# Patient Record
Sex: Male | Born: 1955
Health system: Southern US, Community
[De-identification: ages and names within clinical notes are randomized; demographics above are authoritative.]

## PROBLEM LIST (undated history)

## (undated) DIAGNOSIS — M109 Gout, unspecified: Secondary | ICD-10-CM

## (undated) DIAGNOSIS — M4727 Other spondylosis with radiculopathy, lumbosacral region: Secondary | ICD-10-CM

## (undated) DIAGNOSIS — R0989 Other specified symptoms and signs involving the circulatory and respiratory systems: Secondary | ICD-10-CM

## (undated) DIAGNOSIS — M549 Dorsalgia, unspecified: Secondary | ICD-10-CM

## (undated) DIAGNOSIS — F32A Depression, unspecified: Secondary | ICD-10-CM

## (undated) DIAGNOSIS — K860 Alcohol-induced chronic pancreatitis: Secondary | ICD-10-CM

## (undated) DIAGNOSIS — G621 Alcoholic polyneuropathy: Secondary | ICD-10-CM

## (undated) DIAGNOSIS — Z8719 Personal history of other diseases of the digestive system: Secondary | ICD-10-CM

## (undated) DIAGNOSIS — B1910 Unspecified viral hepatitis B without hepatic coma: Secondary | ICD-10-CM

## (undated) DIAGNOSIS — M79609 Pain in unspecified limb: Secondary | ICD-10-CM

## (undated) DIAGNOSIS — R569 Unspecified convulsions: Secondary | ICD-10-CM

## (undated) DIAGNOSIS — K259 Gastric ulcer, unspecified as acute or chronic, without hemorrhage or perforation: Secondary | ICD-10-CM

## (undated) DIAGNOSIS — Z87891 Personal history of nicotine dependence: Secondary | ICD-10-CM

## (undated) DIAGNOSIS — K7460 Unspecified cirrhosis of liver: Secondary | ICD-10-CM

## (undated) DIAGNOSIS — F329 Major depressive disorder, single episode, unspecified: Secondary | ICD-10-CM

## (undated) DIAGNOSIS — M654 Radial styloid tenosynovitis [de Quervain]: Secondary | ICD-10-CM

## (undated) DIAGNOSIS — C3492 Malignant neoplasm of unspecified part of left bronchus or lung: Secondary | ICD-10-CM

## (undated) DIAGNOSIS — D696 Thrombocytopenia, unspecified: Secondary | ICD-10-CM

## (undated) DIAGNOSIS — R911 Solitary pulmonary nodule: Secondary | ICD-10-CM

## (undated) DIAGNOSIS — M79673 Pain in unspecified foot: Secondary | ICD-10-CM

## (undated) DIAGNOSIS — C229 Malignant neoplasm of liver, not specified as primary or secondary: Secondary | ICD-10-CM

## (undated) DIAGNOSIS — Z87898 Personal history of other specified conditions: Secondary | ICD-10-CM

## (undated) DIAGNOSIS — F1120 Opioid dependence, uncomplicated: Secondary | ICD-10-CM

## (undated) DIAGNOSIS — J449 Chronic obstructive pulmonary disease, unspecified: Secondary | ICD-10-CM

## (undated) DIAGNOSIS — M542 Cervicalgia: Secondary | ICD-10-CM

## (undated) DIAGNOSIS — G40909 Epilepsy, unspecified, not intractable, without status epilepticus: Secondary | ICD-10-CM

## (undated) DIAGNOSIS — M4726 Other spondylosis with radiculopathy, lumbar region: Secondary | ICD-10-CM

## (undated) DIAGNOSIS — D72819 Decreased white blood cell count, unspecified: Secondary | ICD-10-CM

## (undated) DIAGNOSIS — R161 Splenomegaly, not elsewhere classified: Secondary | ICD-10-CM

## (undated) DIAGNOSIS — F419 Anxiety disorder, unspecified: Secondary | ICD-10-CM

## (undated) DIAGNOSIS — R5600 Simple febrile convulsions: Secondary | ICD-10-CM

## (undated) DIAGNOSIS — G249 Dystonia, unspecified: Secondary | ICD-10-CM

## (undated) DIAGNOSIS — K649 Unspecified hemorrhoids: Secondary | ICD-10-CM

## (undated) DIAGNOSIS — R937 Abnormal findings on diagnostic imaging of other parts of musculoskeletal system: Secondary | ICD-10-CM

## (undated) DIAGNOSIS — G4733 Obstructive sleep apnea (adult) (pediatric): Secondary | ICD-10-CM

## (undated) DIAGNOSIS — S0990XA Unspecified injury of head, initial encounter: Secondary | ICD-10-CM

## (undated) DIAGNOSIS — M4800 Spinal stenosis, site unspecified: Secondary | ICD-10-CM

## (undated) DIAGNOSIS — G629 Polyneuropathy, unspecified: Secondary | ICD-10-CM

## (undated) DIAGNOSIS — M792 Neuralgia and neuritis, unspecified: Secondary | ICD-10-CM

## (undated) HISTORY — DX: Cervicalgia: M54.2

## (undated) HISTORY — PX: OTHER SURGICAL HISTORY: SHX169

## (undated) HISTORY — DX: Unspecified hemorrhoids: K64.9

## (undated) HISTORY — DX: Unspecified convulsions: R56.9

## (undated) HISTORY — DX: Dystonia, unspecified: G24.9

## (undated) HISTORY — DX: Pain in unspecified foot: M79.673

## (undated) HISTORY — DX: Personal history of nicotine dependence: Z87.891

## (undated) HISTORY — DX: Spinal stenosis, site unspecified: M48.00

## (undated) HISTORY — DX: Alcoholic polyneuropathy: G62.1

## (undated) HISTORY — DX: Decreased white blood cell count, unspecified: D72.819

## (undated) HISTORY — DX: Neuralgia and neuritis, unspecified: M79.2

## (undated) HISTORY — DX: Alcohol-induced chronic pancreatitis: K86.0

## (undated) HISTORY — DX: Personal history of other diseases of the digestive system: Z87.19

## (undated) HISTORY — DX: Radial styloid tenosynovitis (de quervain): M65.4

## (undated) HISTORY — DX: Epilepsy, unspecified, not intractable, without status epilepticus: G40.909

## (undated) HISTORY — DX: Gout, unspecified: M10.9

## (undated) HISTORY — DX: Major depressive disorder, single episode, unspecified: F32.9

## (undated) HISTORY — DX: Other spondylosis with radiculopathy, lumbar region: M47.26

## (undated) HISTORY — DX: Polyneuropathy, unspecified: G62.9

## (undated) HISTORY — DX: Obstructive sleep apnea (adult) (pediatric): G47.33

## (undated) HISTORY — DX: Abnormal findings on diagnostic imaging of other parts of musculoskeletal system: R93.7

## (undated) HISTORY — DX: Unspecified viral hepatitis B without hepatic coma: B19.10

## (undated) HISTORY — DX: Gastric ulcer, unspecified as acute or chronic, without hemorrhage or perforation: K25.9

## (undated) HISTORY — DX: Unspecified injury of head, initial encounter: S09.90XA

## (undated) HISTORY — DX: Simple febrile convulsions: R56.00

## (undated) HISTORY — DX: Anxiety disorder, unspecified: F41.9

## (undated) HISTORY — DX: Splenomegaly, not elsewhere classified: R16.1

## (undated) HISTORY — DX: Unspecified cirrhosis of liver: K74.60

## (undated) HISTORY — DX: Other spondylosis with radiculopathy, lumbosacral region: M47.27

## (undated) HISTORY — DX: Personal history of other specified conditions: Z87.898

## (undated) HISTORY — DX: Opioid dependence, uncomplicated: F11.20

## (undated) HISTORY — DX: Thrombocytopenia, unspecified: D69.6

## (undated) HISTORY — DX: Depression, unspecified: F32.A

## (undated) HISTORY — DX: Chronic obstructive pulmonary disease, unspecified: J44.9

## (undated) HISTORY — DX: Other specified symptoms and signs involving the circulatory and respiratory systems: R09.89

## (undated) HISTORY — DX: Solitary pulmonary nodule: R91.1

## (undated) HISTORY — DX: Dorsalgia, unspecified: M54.9

## (undated) HISTORY — DX: Pain in unspecified limb: M79.609

---

## 2005-11-20 ENCOUNTER — Other Ambulatory Visit: Payer: Self-pay

## 2005-11-20 ENCOUNTER — Emergency Department: Payer: Self-pay | Admitting: Emergency Medicine

## 2006-03-05 ENCOUNTER — Inpatient Hospital Stay: Payer: Self-pay | Admitting: Internal Medicine

## 2006-03-05 ENCOUNTER — Other Ambulatory Visit: Payer: Self-pay

## 2009-06-14 ENCOUNTER — Emergency Department: Payer: Self-pay | Admitting: Unknown Physician Specialty

## 2009-06-16 ENCOUNTER — Emergency Department: Payer: Self-pay | Admitting: Emergency Medicine

## 2009-12-28 ENCOUNTER — Emergency Department: Payer: Self-pay | Admitting: Emergency Medicine

## 2012-03-06 ENCOUNTER — Ambulatory Visit: Payer: Self-pay | Admitting: Internal Medicine

## 2012-03-18 ENCOUNTER — Inpatient Hospital Stay: Payer: Self-pay | Admitting: Internal Medicine

## 2012-03-18 LAB — CBC WITH DIFFERENTIAL/PLATELET
Basophil #: 0 10*3/uL (ref 0.0–0.1)
Eosinophil #: 0 10*3/uL (ref 0.0–0.7)
Lymphocyte %: 5.2 %
Lymphocytes: 3 %
MCHC: 34.2 g/dL (ref 32.0–36.0)
Monocyte %: 7.2 %
Neutrophil #: 2.6 10*3/uL (ref 1.4–6.5)
Neutrophil %: 86.8 %
Platelet: 19 10*3/uL — CL (ref 150–440)
RDW: 14.8 % — ABNORMAL HIGH (ref 11.5–14.5)
Segmented Neutrophils: 83 %
Variant Lymphocyte - H1-Rlymph: 1 %

## 2012-03-18 LAB — COMPREHENSIVE METABOLIC PANEL
Albumin: 3.3 g/dL — ABNORMAL LOW (ref 3.4–5.0)
Anion Gap: 12 (ref 7–16)
BUN: 4 mg/dL — ABNORMAL LOW (ref 7–18)
Calcium, Total: 8.2 mg/dL — ABNORMAL LOW (ref 8.5–10.1)
Chloride: 103 mmol/L (ref 98–107)
Co2: 26 mmol/L (ref 21–32)
EGFR (Non-African Amer.): >60
Potassium: 3.7 mmol/L (ref 3.5–5.1)
SGOT(AST): 343 U/L — ABNORMAL HIGH (ref 15–37)
SGPT (ALT): 119 U/L — ABNORMAL HIGH
Total Protein: 7.5 g/dL (ref 6.4–8.2)

## 2012-03-18 LAB — APTT: Activated PTT: 33.9 secs (ref 23.6–35.9)

## 2012-03-18 LAB — HEMOGLOBIN: HGB: 13.6 g/dL (ref 13.0–18.0)

## 2012-03-18 LAB — ETHANOL
Ethanol %: 0.049 % (ref 0.000–0.080)
Ethanol: 49 mg/dL

## 2012-03-18 LAB — PROTIME-INR
INR: 1.2
Prothrombin Time: 15.2 secs — ABNORMAL HIGH (ref 11.5–14.7)

## 2012-03-19 LAB — BASIC METABOLIC PANEL
Anion Gap: 9 (ref 7–16)
BUN: 6 mg/dL — ABNORMAL LOW (ref 7–18)
Co2: 28 mmol/L (ref 21–32)
Creatinine: 0.54 mg/dL — ABNORMAL LOW (ref 0.60–1.30)
EGFR (African American): >60
EGFR (Non-African Amer.): >60
Glucose: 87 mg/dL (ref 65–99)
Sodium: 142 mmol/L (ref 136–145)

## 2012-03-19 LAB — CBC WITH DIFFERENTIAL/PLATELET
Basophil #: 0 10*3/uL (ref 0.0–0.1)
Basophil %: 1 %
Eosinophil %: 4.5 %
HCT: 39.2 % — ABNORMAL LOW (ref 40.0–52.0)
Lymphocyte #: 0.4 10*3/uL — ABNORMAL LOW (ref 1.0–3.6)
Lymphocyte %: 22.3 %
MCH: 37.6 pg — ABNORMAL HIGH (ref 26.0–34.0)
MCHC: 33.5 g/dL (ref 32.0–36.0)
MCV: 112 fL — ABNORMAL HIGH (ref 80–100)
Monocyte #: 0.1 x10 3/mm — ABNORMAL LOW (ref 0.2–1.0)
Neutrophil #: 1.3 10*3/uL — ABNORMAL LOW (ref 1.4–6.5)
Platelet: 32 10*3/uL — ABNORMAL LOW (ref 150–440)
RBC: 3.49 10*6/uL — ABNORMAL LOW (ref 4.40–5.90)
RDW: 14.9 % — ABNORMAL HIGH (ref 11.5–14.5)

## 2012-03-19 LAB — PROTIME-INR
INR: 1.2
Prothrombin Time: 15.4 secs — ABNORMAL HIGH (ref 11.5–14.7)

## 2012-03-19 LAB — POTASSIUM: Potassium: 4.1 mmol/L (ref 3.5–5.1)

## 2012-03-20 LAB — CBC WITH DIFFERENTIAL/PLATELET
Basophil %: 1.2 %
Eosinophil %: 6.4 %
HGB: 14.4 g/dL (ref 13.0–18.0)
Lymphocyte %: 31.7 %
MCH: 38.3 pg — ABNORMAL HIGH (ref 26.0–34.0)
Monocyte %: 11.2 %
Neutrophil %: 49.5 %
Platelet: 31 10*3/uL — ABNORMAL LOW (ref 150–440)
RBC: 3.76 10*6/uL — ABNORMAL LOW (ref 4.40–5.90)
WBC: 1.7 10*3/uL — CL (ref 3.8–10.6)

## 2012-03-20 LAB — BASIC METABOLIC PANEL
Anion Gap: 9 (ref 7–16)
BUN: 5 mg/dL — ABNORMAL LOW (ref 7–18)
Co2: 29 mmol/L (ref 21–32)
Creatinine: 0.44 mg/dL — ABNORMAL LOW (ref 0.60–1.30)
EGFR (African American): >60
Sodium: 139 mmol/L (ref 136–145)

## 2012-03-21 LAB — CBC WITH DIFFERENTIAL/PLATELET
Basophil #: 0 10*3/uL (ref 0.0–0.1)
Eosinophil #: 0 10*3/uL (ref 0.0–0.7)
HGB: 15.4 g/dL (ref 13.0–18.0)
Lymphocyte %: 20.2 %
MCHC: 33 g/dL (ref 32.0–36.0)
Neutrophil %: 59.3 %
RDW: 14.3 % (ref 11.5–14.5)
WBC: 2.4 10*3/uL — ABNORMAL LOW (ref 3.8–10.6)

## 2012-03-21 LAB — COMPREHENSIVE METABOLIC PANEL
Albumin: 3.4 g/dL (ref 3.4–5.0)
Anion Gap: 8 (ref 7–16)
BUN: 5 mg/dL — ABNORMAL LOW (ref 7–18)
Bilirubin,Total: 3.3 mg/dL — ABNORMAL HIGH (ref 0.2–1.0)
Calcium, Total: 8.6 mg/dL (ref 8.5–10.1)
Chloride: 102 mmol/L (ref 98–107)
Creatinine: 0.6 mg/dL (ref 0.60–1.30)
EGFR (Non-African Amer.): >60
Osmolality: 277 (ref 275–301)
Potassium: 3.3 mmol/L — ABNORMAL LOW (ref 3.5–5.1)
SGOT(AST): 114 U/L — ABNORMAL HIGH (ref 15–37)
Sodium: 140 mmol/L (ref 136–145)

## 2012-03-21 LAB — MAGNESIUM: Magnesium: 1.9 mg/dL

## 2012-03-22 LAB — COMPREHENSIVE METABOLIC PANEL
Albumin: 3 g/dL — ABNORMAL LOW (ref 3.4–5.0)
Anion Gap: 9 (ref 7–16)
Calcium, Total: 8.4 mg/dL — ABNORMAL LOW (ref 8.5–10.1)
Chloride: 102 mmol/L (ref 98–107)
Co2: 28 mmol/L (ref 21–32)
Creatinine: 0.55 mg/dL — ABNORMAL LOW (ref 0.60–1.30)
EGFR (African American): >60
Glucose: 111 mg/dL — ABNORMAL HIGH (ref 65–99)
Potassium: 3.5 mmol/L (ref 3.5–5.1)
Sodium: 139 mmol/L (ref 136–145)

## 2012-03-22 LAB — CBC WITH DIFFERENTIAL/PLATELET
HCT: 42.8 % (ref 40.0–52.0)
MCH: 37.9 pg — ABNORMAL HIGH (ref 26.0–34.0)
MCV: 112 fL — ABNORMAL HIGH (ref 80–100)
Monocyte #: 0.7 x10 3/mm (ref 0.2–1.0)
Monocyte %: 22.1 %
Neutrophil %: 50.6 %
Platelet: 27 10*3/uL — CL (ref 150–440)
RDW: 14.9 % — ABNORMAL HIGH (ref 11.5–14.5)
WBC: 3 10*3/uL — ABNORMAL LOW (ref 3.8–10.6)

## 2012-03-22 LAB — IRON AND TIBC
Iron Bind.Cap.(Total): 251 ug/dL (ref 250–450)
Iron Saturation: 41 %
Unbound Iron-Bind.Cap.: 147 ug/dL

## 2012-03-22 LAB — FERRITIN: Ferritin (ARMC): 1369 ng/mL — ABNORMAL HIGH (ref 8–388)

## 2012-03-22 LAB — AFP TUMOR MARKER: AFP-Tumor Marker: 6.4 ng/mL (ref 0.0–8.3)

## 2012-03-22 LAB — FIBRINOGEN: Fibrinogen: 321 mg/dL (ref 210–470)

## 2012-03-23 LAB — CBC WITH DIFFERENTIAL/PLATELET
Basophil #: 0.1 10*3/uL (ref 0.0–0.1)
Eosinophil #: 0.1 10*3/uL (ref 0.0–0.7)
Eosinophil %: 4.2 %
HCT: 42.1 % (ref 40.0–52.0)
HGB: 14.2 g/dL (ref 13.0–18.0)
Lymphocyte #: 0.6 10*3/uL — ABNORMAL LOW (ref 1.0–3.6)
MCHC: 33.7 g/dL (ref 32.0–36.0)
Monocyte #: 0.5 x10 3/mm (ref 0.2–1.0)
Monocyte %: 20.2 %
Neutrophil #: 1.4 10*3/uL (ref 1.4–6.5)
Neutrophil %: 50.5 %
Platelet: 31 10*3/uL — ABNORMAL LOW (ref 150–440)
RDW: 14.9 % — ABNORMAL HIGH (ref 11.5–14.5)
WBC: 2.7 10*3/uL — ABNORMAL LOW (ref 3.8–10.6)

## 2012-03-25 LAB — CBC WITH DIFFERENTIAL/PLATELET
Basophil #: 0 10*3/uL (ref 0.0–0.1)
Basophil %: 1.3 %
Eosinophil #: 0.1 10*3/uL (ref 0.0–0.7)
Eosinophil %: 4.2 %
HGB: 13.6 g/dL (ref 13.0–18.0)
Lymphocyte #: 0.7 10*3/uL — ABNORMAL LOW (ref 1.0–3.6)
Lymphocyte %: 25.4 %
MCHC: 34.2 g/dL (ref 32.0–36.0)
Neutrophil %: 48.2 %
Platelet: 30 10*3/uL — CL (ref 150–440)
RBC: 3.57 10*6/uL — ABNORMAL LOW (ref 4.40–5.90)

## 2012-03-26 LAB — COMPREHENSIVE METABOLIC PANEL
Albumin: 2.9 g/dL — ABNORMAL LOW (ref 3.4–5.0)
Alkaline Phosphatase: 90 U/L (ref 50–136)
Anion Gap: 11 (ref 7–16)
Calcium, Total: 8.9 mg/dL (ref 8.5–10.1)
Co2: 25 mmol/L (ref 21–32)
EGFR (Non-African Amer.): >60
Glucose: 83 mg/dL (ref 65–99)
Osmolality: 270 (ref 275–301)
Potassium: 4 mmol/L (ref 3.5–5.1)
SGOT(AST): 71 U/L — ABNORMAL HIGH (ref 15–37)
Sodium: 137 mmol/L (ref 136–145)

## 2012-03-26 LAB — CBC WITH DIFFERENTIAL/PLATELET
Basophil #: 0 10*3/uL (ref 0.0–0.1)
Eosinophil %: 3.9 %
Lymphocyte #: 0.7 10*3/uL — ABNORMAL LOW (ref 1.0–3.6)
Lymphocyte %: 22.5 %
MCH: 37.6 pg — ABNORMAL HIGH (ref 26.0–34.0)
MCV: 112 fL — ABNORMAL HIGH (ref 80–100)
Monocyte #: 0.6 x10 3/mm (ref 0.2–1.0)
Monocyte %: 20.1 %
Neutrophil %: 52.1 %
Platelet: 34 10*3/uL — ABNORMAL LOW (ref 150–440)
RDW: 14.3 % (ref 11.5–14.5)
WBC: 3.2 10*3/uL — ABNORMAL LOW (ref 3.8–10.6)

## 2012-03-27 LAB — CBC WITH DIFFERENTIAL/PLATELET
Basophil %: 1.3 %
Eosinophil #: 0.1 10*3/uL (ref 0.0–0.7)
HCT: 40.7 % (ref 40.0–52.0)
HGB: 13.4 g/dL (ref 13.0–18.0)
Lymphocyte #: 0.8 10*3/uL — ABNORMAL LOW (ref 1.0–3.6)
Lymphocyte %: 16.1 %
MCH: 36.8 pg — ABNORMAL HIGH (ref 26.0–34.0)
MCHC: 32.8 g/dL (ref 32.0–36.0)
Monocyte #: 0.8 x10 3/mm (ref 0.2–1.0)
Neutrophil #: 3.3 10*3/uL (ref 1.4–6.5)

## 2012-03-28 LAB — CBC WITH DIFFERENTIAL/PLATELET
Basophil %: 1.3 %
Eosinophil %: 3.2 %
HGB: 12.4 g/dL — ABNORMAL LOW (ref 13.0–18.0)
Lymphocyte #: 1 10*3/uL (ref 1.0–3.6)
MCH: 37.8 pg — ABNORMAL HIGH (ref 26.0–34.0)
MCV: 112 fL — ABNORMAL HIGH (ref 80–100)
Monocyte #: 0.8 x10 3/mm (ref 0.2–1.0)
Monocyte %: 17.6 %
Platelet: 38 10*3/uL — ABNORMAL LOW (ref 150–440)
RBC: 3.29 10*6/uL — ABNORMAL LOW (ref 4.40–5.90)
RDW: 14.1 % (ref 11.5–14.5)
WBC: 4.3 10*3/uL (ref 3.8–10.6)

## 2012-04-06 ENCOUNTER — Ambulatory Visit: Payer: Self-pay | Admitting: Internal Medicine

## 2012-04-17 ENCOUNTER — Inpatient Hospital Stay: Payer: Self-pay | Admitting: Internal Medicine

## 2012-04-17 LAB — DRUG SCREEN, URINE
Amphetamines, Ur Screen: NEGATIVE (ref ?–1000)
Benzodiazepine, Ur Scrn: POSITIVE (ref ?–200)
Cannabinoid 50 Ng, Ur ~~LOC~~: NEGATIVE (ref ?–50)
Cocaine Metabolite,Ur ~~LOC~~: NEGATIVE (ref ?–300)
MDMA (Ecstasy)Ur Screen: NEGATIVE (ref ?–500)
Tricyclic, Ur Screen: NEGATIVE (ref ?–1000)

## 2012-04-17 LAB — ETHANOL
Ethanol %: <0.003 % (ref 0.000–0.080)
Ethanol: <3 mg/dL

## 2012-04-17 LAB — COMPREHENSIVE METABOLIC PANEL
Alkaline Phosphatase: 97 U/L (ref 50–136)
Anion Gap: 8 (ref 7–16)
BUN: 4 mg/dL — ABNORMAL LOW (ref 7–18)
Chloride: 99 mmol/L (ref 98–107)
Creatinine: 0.6 mg/dL (ref 0.60–1.30)
EGFR (African American): >60
EGFR (Non-African Amer.): >60
Glucose: 91 mg/dL (ref 65–99)
Osmolality: 270 (ref 275–301)
SGPT (ALT): 35 U/L (ref 12–78)

## 2012-04-17 LAB — URINALYSIS, COMPLETE
Bacteria: NONE SEEN
Bilirubin,UR: NEGATIVE
Leukocyte Esterase: NEGATIVE
Nitrite: NEGATIVE
Ph: 6 (ref 4.5–8.0)
Protein: NEGATIVE
RBC,UR: 1 /HPF (ref 0–5)

## 2012-04-17 LAB — PROTIME-INR
INR: 1.1
Prothrombin Time: 15 secs — ABNORMAL HIGH (ref 11.5–14.7)

## 2012-04-17 LAB — CBC
HCT: 36.1 % — ABNORMAL LOW (ref 40.0–52.0)
MCHC: 35 g/dL (ref 32.0–36.0)
MCV: 111 fL — ABNORMAL HIGH (ref 80–100)
Platelet: 66 10*3/uL — ABNORMAL LOW (ref 150–440)
RDW: 13.9 % (ref 11.5–14.5)
WBC: 5.2 10*3/uL (ref 3.8–10.6)

## 2012-04-17 LAB — CK TOTAL AND CKMB (NOT AT ARMC)
CK, Total: 45 U/L (ref 35–232)
CK-MB: 0.8 ng/mL (ref 0.5–3.6)

## 2012-04-18 LAB — COMPREHENSIVE METABOLIC PANEL
Albumin: 2.3 g/dL — ABNORMAL LOW (ref 3.4–5.0)
Alkaline Phosphatase: 82 U/L (ref 50–136)
Bilirubin,Total: 1.6 mg/dL — ABNORMAL HIGH (ref 0.2–1.0)
Calcium, Total: 8 mg/dL — ABNORMAL LOW (ref 8.5–10.1)
Chloride: 104 mmol/L (ref 98–107)
Co2: 29 mmol/L (ref 21–32)
Creatinine: 0.49 mg/dL — ABNORMAL LOW (ref 0.60–1.30)
EGFR (African American): >60
EGFR (Non-African Amer.): >60
Glucose: 93 mg/dL (ref 65–99)
SGPT (ALT): 30 U/L (ref 12–78)

## 2012-04-18 LAB — CBC WITH DIFFERENTIAL/PLATELET
Eosinophil #: 0.2 10*3/uL (ref 0.0–0.7)
Eosinophil %: 4.1 %
HGB: 12.1 g/dL — ABNORMAL LOW (ref 13.0–18.0)
Lymphocyte %: 18 %
MCH: 37.9 pg — ABNORMAL HIGH (ref 26.0–34.0)
MCHC: 34.7 g/dL (ref 32.0–36.0)
Neutrophil %: 67.3 %
Platelet: 64 10*3/uL — ABNORMAL LOW (ref 150–440)
RBC: 3.18 10*6/uL — ABNORMAL LOW (ref 4.40–5.90)

## 2012-04-18 LAB — WBCS, STOOL

## 2012-04-18 LAB — MAGNESIUM: Magnesium: 1.3 mg/dL — ABNORMAL LOW

## 2012-04-19 LAB — HEMOGLOBIN: HGB: 11.8 g/dL — ABNORMAL LOW (ref 13.0–18.0)

## 2012-04-20 LAB — CBC WITH DIFFERENTIAL/PLATELET
Basophil #: 0.1 10*3/uL (ref 0.0–0.1)
Eosinophil #: 0.3 10*3/uL (ref 0.0–0.7)
Lymphocyte #: 1 10*3/uL (ref 1.0–3.6)
MCH: 38.6 pg — ABNORMAL HIGH (ref 26.0–34.0)
MCHC: 35.1 g/dL (ref 32.0–36.0)
MCV: 110 fL — ABNORMAL HIGH (ref 80–100)
Monocyte #: 0.5 x10 3/mm (ref 0.2–1.0)
Monocyte %: 10.4 %
Neutrophil #: 2.6 10*3/uL (ref 1.4–6.5)
Neutrophil %: 58.2 %
Platelet: 66 10*3/uL — ABNORMAL LOW (ref 150–440)
RDW: 13.9 % (ref 11.5–14.5)
WBC: 4.4 10*3/uL (ref 3.8–10.6)

## 2012-05-25 ENCOUNTER — Emergency Department: Payer: Self-pay | Admitting: Emergency Medicine

## 2012-10-13 ENCOUNTER — Ambulatory Visit: Payer: Self-pay | Admitting: Neurology

## 2012-10-26 ENCOUNTER — Ambulatory Visit: Payer: Self-pay | Admitting: Oncology

## 2012-10-26 ENCOUNTER — Ambulatory Visit: Payer: Self-pay | Admitting: Neurology

## 2012-11-08 ENCOUNTER — Ambulatory Visit: Payer: Self-pay | Admitting: Internal Medicine

## 2012-11-09 LAB — CBC CANCER CENTER
Basophil #: 0.1 x10 3/mm (ref 0.0–0.1)
Basophil %: 2.5 %
Eosinophil #: 0.2 x10 3/mm (ref 0.0–0.7)
Eosinophil %: 4.4 %
HGB: 15 g/dL (ref 13.0–18.0)
Lymphocyte #: 1.1 x10 3/mm (ref 1.0–3.6)
MCH: 35.6 pg — ABNORMAL HIGH (ref 26.0–34.0)
MCHC: 35.4 g/dL (ref 32.0–36.0)
Monocyte #: 0.5 x10 3/mm (ref 0.2–1.0)
Monocyte %: 10.5 %
Neutrophil #: 2.5 x10 3/mm (ref 1.4–6.5)
Neutrophil %: 57.4 %
Platelet: 60 x10 3/mm — ABNORMAL LOW (ref 150–440)
RDW: 13.3 % (ref 11.5–14.5)
WBC: 4.3 x10 3/mm (ref 3.8–10.6)

## 2012-11-09 LAB — HEPATIC FUNCTION PANEL A (ARMC)
Albumin: 3.6 g/dL (ref 3.4–5.0)
Alkaline Phosphatase: 140 U/L — ABNORMAL HIGH (ref 50–136)
Bilirubin, Direct: 0.3 mg/dL — ABNORMAL HIGH (ref 0.00–0.20)
SGOT(AST): 43 U/L — ABNORMAL HIGH (ref 15–37)
SGPT (ALT): 34 U/L (ref 12–78)
Total Protein: 8.9 g/dL — ABNORMAL HIGH (ref 6.4–8.2)

## 2012-11-09 LAB — CREATININE, SERUM: Creatinine: 0.59 mg/dL — ABNORMAL LOW (ref 0.60–1.30)

## 2012-11-09 LAB — PROTIME-INR: INR: 1.1

## 2012-11-13 LAB — PROT IMMUNOELECTROPHORES(ARMC)

## 2012-11-13 LAB — KAPPA/LAMBDA FREE LIGHT CHAINS (ARMC)

## 2012-12-05 ENCOUNTER — Ambulatory Visit: Payer: Self-pay | Admitting: Internal Medicine

## 2012-12-21 LAB — CBC CANCER CENTER
Basophil #: 0 x10 3/mm (ref 0.0–0.1)
Eosinophil #: 0.3 x10 3/mm (ref 0.0–0.7)
Eosinophil %: 5.9 %
HCT: 40.7 % (ref 40.0–52.0)
HGB: 14.2 g/dL (ref 13.0–18.0)
MCH: 34.7 pg — ABNORMAL HIGH (ref 26.0–34.0)
MCHC: 35 g/dL (ref 32.0–36.0)
MCV: 99 fL (ref 80–100)
Monocyte %: 9.7 %
Neutrophil #: 2.5 x10 3/mm (ref 1.4–6.5)
Neutrophil %: 57.6 %
Platelet: 58 x10 3/mm — ABNORMAL LOW (ref 150–440)
RBC: 4.11 10*6/uL — ABNORMAL LOW (ref 4.40–5.90)

## 2012-12-22 LAB — URINE IEP, RANDOM

## 2012-12-25 LAB — KAPPA/LAMBDA FREE LIGHT CHAINS (ARMC)

## 2013-01-04 ENCOUNTER — Ambulatory Visit: Payer: Self-pay | Admitting: Internal Medicine

## 2013-01-11 LAB — CBC CANCER CENTER
Eosinophil #: 0.2 x10 3/mm (ref 0.0–0.7)
Eosinophil %: 4.5 %
HCT: 37.8 % — ABNORMAL LOW (ref 40.0–52.0)
HGB: 13.5 g/dL (ref 13.0–18.0)
Lymphocyte %: 25.2 %
MCH: 35.1 pg — ABNORMAL HIGH (ref 26.0–34.0)
MCHC: 35.7 g/dL (ref 32.0–36.0)
Monocyte #: 0.4 x10 3/mm (ref 0.2–1.0)
Monocyte %: 8.3 %
Neutrophil %: 61.2 %
Platelet: 55 x10 3/mm — ABNORMAL LOW (ref 150–440)
RBC: 3.85 10*6/uL — ABNORMAL LOW (ref 4.40–5.90)
RDW: 13.9 % (ref 11.5–14.5)
WBC: 4.3 x10 3/mm (ref 3.8–10.6)

## 2013-01-25 LAB — CBC CANCER CENTER
Eosinophil #: 0.3 x10 3/mm (ref 0.0–0.7)
HCT: 39.1 % — ABNORMAL LOW (ref 40.0–52.0)
Lymphocyte %: 26.6 %
MCH: 34.6 pg — ABNORMAL HIGH (ref 26.0–34.0)
MCHC: 35.2 g/dL (ref 32.0–36.0)
Monocyte %: 10.7 %
Neutrophil %: 55.8 %
Platelet: 61 x10 3/mm — ABNORMAL LOW (ref 150–440)
RBC: 3.98 10*6/uL — ABNORMAL LOW (ref 4.40–5.90)
WBC: 4.9 x10 3/mm (ref 3.8–10.6)

## 2013-01-25 LAB — RETICULOCYTES: Reticulocyte: 1.78 %

## 2013-01-25 LAB — IRON AND TIBC
Iron Bind.Cap.(Total): 350 ug/dL (ref 250–450)
Iron: 108 ug/dL (ref 65–175)

## 2013-01-25 LAB — LACTATE DEHYDROGENASE: LDH: 203 U/L (ref 85–241)

## 2013-01-25 LAB — FERRITIN: Ferritin (ARMC): 524 ng/mL — ABNORMAL HIGH (ref 8–388)

## 2013-02-04 ENCOUNTER — Ambulatory Visit: Payer: Self-pay | Admitting: Internal Medicine

## 2013-03-06 ENCOUNTER — Ambulatory Visit: Payer: Self-pay | Admitting: Internal Medicine

## 2013-03-13 ENCOUNTER — Encounter: Payer: Self-pay | Admitting: Neurology

## 2013-03-21 LAB — CBC CANCER CENTER
Basophil %: 1.4 %
HGB: 14.7 g/dL (ref 13.0–18.0)
MCH: 36 pg — ABNORMAL HIGH (ref 26.0–34.0)
MCHC: 36.7 g/dL — ABNORMAL HIGH (ref 32.0–36.0)
MCV: 98 fL (ref 80–100)
Monocyte #: 0.6 x10 3/mm (ref 0.2–1.0)
Monocyte %: 11.3 %
Neutrophil #: 2.9 x10 3/mm (ref 1.4–6.5)
RBC: 4.07 10*6/uL — ABNORMAL LOW (ref 4.40–5.90)
RDW: 13.7 % (ref 11.5–14.5)
WBC: 5.2 x10 3/mm (ref 3.8–10.6)

## 2013-04-06 ENCOUNTER — Ambulatory Visit: Payer: Self-pay | Admitting: Internal Medicine

## 2013-04-06 ENCOUNTER — Encounter: Payer: Self-pay | Admitting: Neurology

## 2013-04-16 ENCOUNTER — Ambulatory Visit: Payer: Self-pay | Admitting: Pain Medicine

## 2013-05-16 ENCOUNTER — Ambulatory Visit: Payer: Self-pay | Admitting: Internal Medicine

## 2013-05-16 LAB — CBC CANCER CENTER
Basophil #: 0 x10 3/mm (ref 0.0–0.1)
Basophil %: 0.6 %
Eosinophil #: 0.2 x10 3/mm (ref 0.0–0.7)
Lymphocyte #: 1.2 x10 3/mm (ref 1.0–3.6)
MCH: 35.4 pg — ABNORMAL HIGH (ref 26.0–34.0)
MCHC: 36.1 g/dL — ABNORMAL HIGH (ref 32.0–36.0)
MCV: 98 fL (ref 80–100)
Monocyte #: 0.4 x10 3/mm (ref 0.2–1.0)
Monocyte %: 8.7 %
Neutrophil #: 2.4 x10 3/mm (ref 1.4–6.5)
Platelet: 58 x10 3/mm — ABNORMAL LOW (ref 150–440)
RBC: 3.91 10*6/uL — ABNORMAL LOW (ref 4.40–5.90)
WBC: 4.3 x10 3/mm (ref 3.8–10.6)

## 2013-05-23 LAB — CANCER CTR PLATELET CT: Platelet: 57 x10 3/mm — ABNORMAL LOW (ref 150–440)

## 2013-05-30 DIAGNOSIS — R161 Splenomegaly, not elsewhere classified: Secondary | ICD-10-CM

## 2013-05-30 HISTORY — DX: Splenomegaly, not elsewhere classified: R16.1

## 2013-06-06 ENCOUNTER — Ambulatory Visit: Payer: Self-pay | Admitting: Internal Medicine

## 2013-06-20 LAB — CBC CANCER CENTER
Basophil %: 0.7 %
MCH: 34.9 pg — ABNORMAL HIGH (ref 26.0–34.0)
MCHC: 35.3 g/dL (ref 32.0–36.0)
MCV: 99 fL (ref 80–100)
Monocyte #: 0.5 x10 3/mm (ref 0.2–1.0)
Neutrophil %: 53.8 %
Platelet: 72 x10 3/mm — ABNORMAL LOW (ref 150–440)
RDW: 13.7 % (ref 11.5–14.5)
WBC: 5 x10 3/mm (ref 3.8–10.6)

## 2013-07-07 ENCOUNTER — Ambulatory Visit: Payer: Self-pay | Admitting: Internal Medicine

## 2013-08-15 ENCOUNTER — Ambulatory Visit: Payer: Self-pay | Admitting: Internal Medicine

## 2013-08-15 LAB — CBC CANCER CENTER
Basophil #: 0 x10 3/mm (ref 0.0–0.1)
Basophil %: 0.6 %
Eosinophil %: 5.1 %
HGB: 14.2 g/dL (ref 13.0–18.0)
Lymphocyte #: 1.1 x10 3/mm (ref 1.0–3.6)
Lymphocyte %: 27.7 %
MCV: 101 fL — ABNORMAL HIGH (ref 80–100)
Monocyte %: 10 %
Neutrophil #: 2.3 x10 3/mm (ref 1.4–6.5)
Platelet: 57 x10 3/mm — ABNORMAL LOW (ref 150–440)
RDW: 13.8 % (ref 11.5–14.5)
WBC: 4.1 x10 3/mm (ref 3.8–10.6)

## 2013-09-06 ENCOUNTER — Ambulatory Visit: Payer: Self-pay | Admitting: Internal Medicine

## 2013-11-04 HISTORY — PX: ESOPHAGOGASTRODUODENOSCOPY: SHX1529

## 2013-11-27 ENCOUNTER — Ambulatory Visit: Payer: Self-pay | Admitting: Internal Medicine

## 2013-11-28 LAB — CBC CANCER CENTER
BASOS PCT: 0.4 %
Basophil #: 0 x10 3/mm (ref 0.0–0.1)
EOS PCT: 3.5 %
Eosinophil #: 0.2 x10 3/mm (ref 0.0–0.7)
HCT: 43.8 % (ref 40.0–52.0)
HGB: 14.8 g/dL (ref 13.0–18.0)
LYMPHS ABS: 1.2 x10 3/mm (ref 1.0–3.6)
Lymphocyte %: 23.4 %
MCH: 33.7 pg (ref 26.0–34.0)
MCHC: 33.8 g/dL (ref 32.0–36.0)
MCV: 100 fL (ref 80–100)
Monocyte #: 0.4 x10 3/mm (ref 0.2–1.0)
Monocyte %: 8.5 %
NEUTROS ABS: 3.4 x10 3/mm (ref 1.4–6.5)
NEUTROS PCT: 64.2 %
Platelet: 54 x10 3/mm — ABNORMAL LOW (ref 150–440)
RBC: 4.39 10*6/uL — ABNORMAL LOW (ref 4.40–5.90)
RDW: 13.7 % (ref 11.5–14.5)
WBC: 5.2 x10 3/mm (ref 3.8–10.6)

## 2013-11-30 ENCOUNTER — Ambulatory Visit: Payer: Self-pay | Admitting: Unknown Physician Specialty

## 2013-12-03 LAB — PATHOLOGY REPORT

## 2013-12-05 ENCOUNTER — Ambulatory Visit: Payer: Self-pay | Admitting: Internal Medicine

## 2013-12-14 LAB — AFP TUMOR MARKER: AFP TUMOR MARKER: 3.2 ng/mL (ref 0.0–8.3)

## 2014-01-04 ENCOUNTER — Ambulatory Visit: Payer: Self-pay | Admitting: Internal Medicine

## 2014-03-14 ENCOUNTER — Ambulatory Visit: Payer: Self-pay | Admitting: Internal Medicine

## 2014-03-14 LAB — CBC CANCER CENTER
Basophil #: 0 x10 3/mm (ref 0.0–0.1)
Basophil %: 0.6 %
EOS ABS: 0.2 x10 3/mm (ref 0.0–0.7)
Eosinophil %: 4.3 %
HCT: 40.9 % (ref 40.0–52.0)
HGB: 14.2 g/dL (ref 13.0–18.0)
LYMPHS ABS: 1.1 x10 3/mm (ref 1.0–3.6)
Lymphocyte %: 29.2 %
MCH: 34.8 pg — ABNORMAL HIGH (ref 26.0–34.0)
MCHC: 34.8 g/dL (ref 32.0–36.0)
MCV: 100 fL (ref 80–100)
MONOS PCT: 8.7 %
Monocyte #: 0.3 x10 3/mm (ref 0.2–1.0)
Neutrophil #: 2.2 x10 3/mm (ref 1.4–6.5)
Neutrophil %: 57.2 %
Platelet: 56 x10 3/mm — ABNORMAL LOW (ref 150–440)
RBC: 4.09 10*6/uL — AB (ref 4.40–5.90)
RDW: 13.7 % (ref 11.5–14.5)
WBC: 3.8 x10 3/mm (ref 3.8–10.6)

## 2014-04-06 ENCOUNTER — Ambulatory Visit: Payer: Self-pay | Admitting: Internal Medicine

## 2014-04-12 ENCOUNTER — Ambulatory Visit: Payer: Self-pay | Admitting: Internal Medicine

## 2014-05-06 DIAGNOSIS — R200 Anesthesia of skin: Secondary | ICD-10-CM | POA: Insufficient documentation

## 2014-05-06 DIAGNOSIS — R262 Difficulty in walking, not elsewhere classified: Secondary | ICD-10-CM | POA: Insufficient documentation

## 2014-05-06 DIAGNOSIS — M542 Cervicalgia: Secondary | ICD-10-CM

## 2014-05-06 DIAGNOSIS — M79609 Pain in unspecified limb: Secondary | ICD-10-CM

## 2014-05-06 DIAGNOSIS — M549 Dorsalgia, unspecified: Secondary | ICD-10-CM

## 2014-05-06 HISTORY — DX: Pain in unspecified limb: M79.609

## 2014-05-06 HISTORY — DX: Dorsalgia, unspecified: M54.9

## 2014-05-06 HISTORY — DX: Cervicalgia: M54.2

## 2014-05-07 ENCOUNTER — Ambulatory Visit: Payer: Self-pay | Admitting: Internal Medicine

## 2014-06-24 ENCOUNTER — Ambulatory Visit: Payer: Self-pay | Admitting: Internal Medicine

## 2014-06-24 LAB — CBC CANCER CENTER
BASOS ABS: 0 x10 3/mm (ref 0.0–0.1)
Basophil %: 0.3 %
EOS ABS: 0.3 x10 3/mm (ref 0.0–0.7)
Eosinophil %: 6.2 %
HCT: 42.9 % (ref 40.0–52.0)
HGB: 14.5 g/dL (ref 13.0–18.0)
LYMPHS PCT: 23.5 %
Lymphocyte #: 1.1 x10 3/mm (ref 1.0–3.6)
MCH: 34.1 pg — ABNORMAL HIGH (ref 26.0–34.0)
MCHC: 33.9 g/dL (ref 32.0–36.0)
MCV: 101 fL — ABNORMAL HIGH (ref 80–100)
MONOS PCT: 8.7 %
Monocyte #: 0.4 x10 3/mm (ref 0.2–1.0)
NEUTROS PCT: 61.3 %
Neutrophil #: 2.8 x10 3/mm (ref 1.4–6.5)
PLATELETS: 63 x10 3/mm — AB (ref 150–440)
RBC: 4.27 10*6/uL — ABNORMAL LOW (ref 4.40–5.90)
RDW: 13.7 % (ref 11.5–14.5)
WBC: 4.5 x10 3/mm (ref 3.8–10.6)

## 2014-07-07 ENCOUNTER — Ambulatory Visit: Payer: Self-pay | Admitting: Internal Medicine

## 2014-08-07 ENCOUNTER — Ambulatory Visit: Payer: Self-pay | Admitting: Internal Medicine

## 2014-09-11 DIAGNOSIS — M79673 Pain in unspecified foot: Secondary | ICD-10-CM

## 2014-09-11 DIAGNOSIS — M79671 Pain in right foot: Secondary | ICD-10-CM | POA: Insufficient documentation

## 2014-09-11 HISTORY — DX: Pain in unspecified foot: M79.673

## 2014-10-28 ENCOUNTER — Ambulatory Visit: Payer: Self-pay | Admitting: Internal Medicine

## 2014-11-05 ENCOUNTER — Ambulatory Visit
Admit: 2014-11-05 | Disposition: A | Discharge status: Home / Self Care | Payer: Self-pay | Attending: Internal Medicine | Admitting: Internal Medicine

## 2014-12-23 ENCOUNTER — Ambulatory Visit
Admit: 2014-12-23 | Disposition: A | Discharge status: Home / Self Care | Payer: Self-pay | Attending: Internal Medicine | Admitting: Internal Medicine

## 2014-12-24 NOTE — H&P (Signed)
PATIENT NAME:  John [name], John [name] MR#:  [id number] DATE OF BIRTH:  18-Mar-1956  DATE OF ADMISSION:  04/17/2012  CHIEF COMPLAINT:  Bleeding per rectum.  HISTORY OF PRESENT ILLNESS: The patient is a 59 year old male who was recently admitted to [facility] from 03/18/2012 to 03/27/2012 for alcohol withdrawal seizures, DTs,  alcoholic hepatitis, and possible GI bleed. The patient at that time was admitted after being found to have alcohol withdrawal seizures. He had a prolonged hospital course and was evaluated by GI for possible bleeding per rectum. He was hemodynamically stable and his hemoglobin and hematocrit were also stable; therefore, no scope was done. The patient did not have any further evidence of GI bleed in the hospital. He also had thrombocytopenia and leukopenia and received one bag of platelets. Given concern for GI bleed he was seen by Dr. Grayland Ormond and no additional work-up was done. He has chronic thrombocytopenia with platelet count in the range of the 40s. He was seen by palliative care during that hospitalization and was discharged to a skilled facility, [facility], for rehab. He was a DO NOT RESUSCITATE. The patient reports that he was at [facility] for 3 to 4 weeks and has been back at home for the past one week. He was doing well, but developed sudden onset of dark blood and bright red blood per rectum and therefore he came to the Emergency Room.  Lopressor is listed as one of his medications but the patient reports he has not been taking it.  He only takes a baby aspirin. He denies taking any other NSAIDs for pain. He reports he has detoxified from alcohol and has not had any alcohol to drink since he went back home. He has gone back to smoking a pack per day.   PAST MEDICAL HISTORY:  1. History of depression. 2. History of hepatitis C.  3. History of alcohol abuse. The patient reports he has not had anything to drink since his last hospitalization.  4. Questionable history  of liver cirrhosis.  5. Questionable history of brain tumor. 6. Ongoing tobacco abuse.  7. Malnourishment and general disability.  8. Leukopenia. 9. Thrombocytopenia. 10. Questionable history of upper GI bleed.  11. Hypertension.  12. History of alcoholic hepatitis. 13. Dystonia.  14. Alcohol withdrawal seizures and DTs.   ALLERGIES: Codeine and morphine.   MEDICATIONS: The patient reports that he is not taking the Lopressor. He only takes an aspirin.   PAST SURGICAL HISTORY: None.   SOCIAL HISTORY: Smokes 1 pack per day. He was a heavy drinker, has not had anything to drink since his last hospitalization. He denies any drug abuse. 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   FAMILY HISTORY: Father had myocardial infarction and cerebrovascular accident. Mother had cerebrovascular accident.  REVIEW OF SYSTEMS: CONSTITUTIONAL: Denies any fever. Reports fatigue and weakness. EYES: Denies any blurred or double vision. ENT: Denies any tinnitus or ear pain.  RESPIRATORY:  Reports cough and some yellowish expectoration. CARDIOVASCULAR: Denies any chest pain or palpitations. GI: Reports some nausea and abdominal pain. Reports dark tarry stool as well as bright red blood per rectum. GU: Denies any dysuria or hematuria. ENDO: Denies any polyuria or nocturia. HEME/LYMPH: Has history of anemia and thrombocytopenia. INTEGUMENT: Denies any acne or rash. MUSCULOSKELETAL: Denies any swelling or gout. NEUROLOGICAL: Denies any numbness or weakness. PSYCH: Denies any anxiety or depression.   PHYSICAL EXAMINATION:  VITAL SIGNS: Temperature 98.8, heart rate 89, respiratory rate 16,  blood pressure 140/72, pulse oximetry 94%.   GENERAL: The patient is a 59 year old male who appears older than his stated age, chronically ill-appearing, lying comfortably in bed, not in acute distress.   HEAD: Atraumatic, normocephalic.   EYES: There is pallor. No icterus or cyanosis. Pupils are equal, round and  reactive to light and accommodation. Extraocular movements intact.  ENT:  Wet mucous membranes. No oropharyngeal erythema or thrush.   NECK: Supple. No masses. No JVD. No thyromegaly.    CHEST WALL: No tenderness to palpation. Not using accessory muscles of respiration. No intercostal muscle retractions.   LUNGS: Bilaterally coarse breath sounds. No wheezing or rhonchi.   CARDIOVASCULAR: S1, S2 regular. No murmur, rubs, or gallops.   ABDOMEN: Soft. There is no guarding, no rigidity. Hyperactive bowel sounds. The patient is very tender to palpation in the epigastrium and the left lower quadrant.   SKIN: No rashes or lesions.   PERIPHERIES: No pedal edema. 2+ pedal pulses.   MUSCULOSKELETAL: No cyanosis or clubbing.   NEUROLOGIC: Awake, alert, oriented times three. Nonfocal neurological exam. Cranial nerves grossly intact. Generalized weakness.   PSYCH: Normal mood and affect.   LABORATORY, DIAGNOSTIC, AND RADIOLOGICAL DATA: Urinalysis shows no evidence of any infection. White count 5.2, hemoglobin 12.6, hematocrit 36.1, platelet count 66, glucose 91, BUN 4, creatinine 0.6, sodium 137, potassium 3.6, chloride 99, CO2 30, calcium 8, total bilirubin 1.3, alkaline phosphatase 97, ALT 35, AST 45, total protein 7.4, albumin 2.6, INR 1.1, PT 32.2. Cardiac enzymes negative.   ASSESSMENT AND PLAN: This is a 59 year old male with recent admission for DTs and alcohol withdrawal seizure presents from home with bright red  bleeding. 1. GI bleed:  The patient reports both melena and bright red bleeding per rectum. During his last admission also he had reported similar symptoms. He was evaluated by Dr. Dionne Milo at that time. He was hemodynamically stable and his hemoglobin and hematocrit were also stable and therefore no endoscopy or colonoscopy was done. In the ER he has had three bowel movements. As per the R.N., it was more stool and very little blood. He is hemodynamically stable and his hemoglobin  and hematocrit are stable as compared to his last admission.  The patient reports taking aspirin and is very tender in the epigastrium. Differential diagnoses include gastritis/diverticulitis/diverticular bleed. We will hold his aspirin and start him on a PPI and also empiric antibiotics since he is very tender in his left lower quadrant. We will obtain a GI consultation. Stool studies have been sent by the ED physician and bleeding scan has been ordered. The patient is not having any evidence of brisk bleeding at present.  2. Thrombocytopenia: The patient has chronic thrombocytopenia. He has been evaluated by Dr. Grayland Ormond in the past during his last admission and no intervention was planned.  This was felt to be due to the patient's history of alcohol abuse. His platelet count is stable as compared to his last admission.  3. Elevated AST: This was felt to be due to alcoholic hepatitis. The patient also has a history of hepatitis B.  4. Smoking: The patient has been counseled about cessation for more than three minutes. We will provide him with a nicotine patch.  5. CODE STATUS: The patient was evaluated by palliative care during his last hospitalization and was a DO NOT RESUSCITATE at that time. This was addressed with the patient again and he endorses DO NOT RESUSCITATE status.  6. History of alcohol abuse and alcohol withdrawal  seizures: The patient reports that he has not had anything to drink since his last hospitalization. We will observe him for any alcohol withdrawal. He currently appears to be very coherent.   Reviewed old medical records, discussed with the ED physician, discussed with the patient the plan of care and management.   TIME SPENT: 75 minutes.   ____________________________ Cherre Huger, MD sp:bjt D: 04/17/2012 22:00:04 ET T: 04/18/2012 09:25:03 ET JOB#: [id number]  cc: Cherre Huger, MD, <Dictator> Cherre Huger MD ELECTRONICALLY SIGNED 04/19/2012 7:06

## 2014-12-24 NOTE — Consult Note (Signed)
PATIENT NAME:  John [name], John [name] MR#:  [id number] DATE OF BIRTH:  21-Dec-1955  DATE OF CONSULTATION:  04/18/2012  REFERRING PHYSICIAN:  Cherre Huger, MD  CONSULTING PHYSICIAN:  Loistine Simas, MD/Dawn Ruthe Mannan, NP  REASON FOR CONSULTATION: Lower GI bleed.   HISTORY OF PRESENT ILLNESS: Mr. Delair is a 59 year old Caucasian gentleman who was recently admitted to [facility] from 03/18/2012 to 03/27/2012 for alcohol withdrawal seizures, DTs, alcoholic hepatitis, and possible GI bleed. He was seen and evaluated by Dr. Dionne Milo at that time. He was monitored for the concern of GI bleed. Hemoglobin and hematocrit remained stable and no endoscopy evaluation was performed at that time. He was also seen by Dr. Grayland Ormond for thrombocytopenia and leukopenia. He did receive 1 unit of platelets. He is followed for chronic thrombocytopenia with platelet count ranging in the 40's.    The patient presented to [facility] Emergency Room yesterday for the concern of abdominal pain, midabdomen predominantly as well as generalized with the onset of discomfort at 3 p.m. that day; first abdominal pain and then followed by evidence of rectal bleeding, bright red blood, as well as dark red at times. He had had bowel movement with evidence of bleeding with feces being present, then he had numerous episodes of rectal bleeding. Color continued to be bright red as well as dark red at times. Last episode of bleeding was this morning. He was unable to elaborate timeframe. Abdominal pain does improve with defecation. No nausea. No vomiting. Presentation of symptoms is very similar to when he was admitted in July but the severity he states is worse. He does take an aspirin 325 mg occasionally. He has been taking more aspirin recently for neck pain. Two nights ago he experienced indigestion. Weight loss during his recent hospitalization where the patient states it was approximately 40 pounds, though his weight he says last Friday was 149 which  means he's gained 20 pounds back. No alcohol consumption since his last hospitalization.    The patient did have a GI blood loss study done on August 13th at 12:06 a.m. Impression states that there is activity within the pelvic structures and/or within the penis. The possibility of an extremely lower gastrointestinal bleed focus cannot be dogmatically excluded. It is noted if the patient could tolerate the procedure that re-imaging within 12 to 18 hours of the initial injection may be helpful. The patient feels that he's continued to pass evidence of blood though he has not actually visualized it. It is noted when he is being cleansed. Hemoglobin has remained stable since being admitted at 12.6 with a slight drop to 12.1. PT is 15.0 with an INR of 1.1. Review of [place] does not reveal evidence of endoscopic evaluation over the past six years. The patient does state he had a colonoscopy done numerous years ago, unable to elaborate when.   PAST MEDICAL HISTORY:  1. Depression. 2. Hepatitis C. 3. Alcohol abuse. No alcohol consumption since last admission, 03/18/2012. 4. Questionable history of liver cirrhosis. 5. Questionable history of brain tumor. 6. Ongoing tobacco use.  7. Malnourishment. 8. Generalized disability. 9. Leukopenia. 10. Chronic thrombocytopenia. 11. Questionable history of upper GI bleed in the past.  12. Possible gastric ulcer. 13. Hypertension. 14. History of alcoholic hepatitis. 15. Dystonia. 16. Alcohol withdrawal with seizures and DTs.   PAST SURGICAL HISTORY: Unremarkable.   MEDICATIONS:  1. The patient is supposed to be taking Lopressor, has not. 2. Aspirin 325 mg tablet as needed for pain management, neck pain.  ALLERGIES: Codeine and morphine.   FAMILY HISTORY: Father with history of myocardial infarction with CVA. Mother had CVA. Father history of prostate cancer. No history of colon cancer.   REVIEW OF SYSTEMS: CONSTITUTIONAL: Denies any fevers. Significant  for fatigue and weakness. EYES: No blurred vision, double vision. ENT: No tinnitus or ear pain. RESPIRATORY: Significant for productive cough, yellow-colored sputum. CARDIOVASCULAR: No chest pain or heart palpitations. GI: See history of present illness. GU: Denies dysuria or hematuria. ENDOCRINE: No polyuria or nocturia. HEME/LYMPH: Significant for anemia. Known history of chronic thrombocytopenia. INTEGUMENTARY: No rashes. No lesions. MUSCULOSKELETAL: No neuralgias or myalgias. NEUROLOGIC: No numbness, no weakness. History of seizures related to alcohol abuse. PSYCH: No anxiety. History of depression.   PHYSICAL EXAMINATION:   VITAL SIGNS: Temperature 98.2, pulse 81, respirations 20, blood pressure 139/72, pulse oximetry 95%.   GENERAL: Well developed, malnourished 59 year old Caucasian gentleman, no acute distress noted but does become very frustrated during questioning at times.   HEENT: Normocephalic, atraumatic. Pupils equal, reactive to light. Conjunctivae clear. Sclerae anicteric.   NECK: Supple. Trachea midline. No lymphadenopathy or thyromegaly.   PULMONARY: Decreased breath sounds noted throughout. Scattered rhonchi. Evidence of yellow-colored sputum.   ABDOMEN: Soft, nondistended. Bowel sounds in four quadrants. No bruits. No masses.   RECTAL: External examination revealed light brownish-colored stool. No gross evidence of bleeding. External hemorrhoids noted. Desitin to area.   MUSCULOSKELETAL: Moving all four extremities. No contractures. No clubbing.   EXTREMITIES: No edema.   PSYCH: Alert and oriented x4, slightly aggressive behavior with questioning. Very frustrated at times.   NEUROLOGICAL: No gross neurological deficits noted.   LABORATORY, DIAGNOSTIC, AND RADIOLOGICAL DATA: See history section for some of the laboratory results. Chemistry panel on admission August 12th BUN was 4, calcium 8.0, otherwise within normal limits. Serum alcohol is less than 3. Today's date,  August 13th, BUN 2, creatinine 0.49, calcium 8.0, and magnesium is 1.3. Hepatic panel on admission albumin 2.6 with a total bilirubin of 1.3, and AST of 45. Comparison today's date, albumin declined to 2.3, total bilirubin rose to 1.6, and AST and ALT within normal limits. CK total 45 with a CK-MB of 0.8 and troponin less than 0.02. Urinalysis revealed to be positive for benzodiazepines. Platelet count on admission 66,000, today 64,000. Antibody screen is negative. ABO group plus Rh type is A+. PT is 15.0 with an INR of 1.1 and PTT of 32.6. C. difficile toxin A and B is negative. Stool for WBCs is unremarkable. Urinalysis revealed +1 blood, otherwise within normal limits.   Single view chest x-ray no acute cardiopulmonary disease. GI blood loss study as documented under history.   IMPRESSION:  1. Lower GI bleed and evidence of anemia which appears to be stable at this time. No gross drop after IV hydration.  2. History of alcohol abuse. 3. Liver cirrhosis. 4. Chronic thrombocytopenia. 5. Abdominal pain.  6. Differential of the patient's presentation inclusive of diverticular bleed.   PLAN: The patient's presentation was discussed with Dr. Loistine Simas. Recommendations are continued serial monitoring of hemoglobin and hematocrit. Transfuse as necessary. Continuation of antibiotic therapy at this time, ciprofloxacin as well as metronidazole as prescribed. Continue Protonix 40 mg IV every 12 hours. Further recommendations to follow in addendum form.   These services provided by Payton Emerald, NP under collaborative agreement with Loistine Simas, MD.  ____________________________ Payton Emerald, NP dsh:drc D: 04/18/2012 14:12:09 ET T: 04/18/2012 16:07:44 ET JOB#: [id number]  cc: Payton Emerald, NP, <Dictator> DAWN  Ruthe Mannan MD ELECTRONICALLY SIGNED 04/19/2012 8:18

## 2014-12-24 NOTE — Consult Note (Signed)
Chief Complaint:   Subjective/Chief Complaint Still with gross tremors. No bleeding.   VITAL SIGNS/ANCILLARY NOTES: **Vital Signs.:   15-Jul-13 18:00   Vital Signs Type Routine   Pulse Pulse 78   Respirations Respirations 21   Systolic BP Systolic BP 481   Diastolic BP (mmHg) Diastolic BP (mmHg) 69   Mean BP 93   Pulse Ox % Pulse Ox % 98   Oxygen Delivery 2L   Pulse Ox Heart Rate 80   Brief Assessment:   Additional Physical Exam Abdomen is benign. Positive tremors.   Lab Results: Routine Chem:  15-Jul-13 06:00    Result Comment wbc - RESULTS VERIFIED BY REPEAT TESTING. cv - NOTIFIED OF CRITICAL VALUE  - c/erin maynor rn 03/20/12 0825 rw  - READ-BACK PROCESS PERFORMED.  Result(s) reported on 20 Mar 2012 at 08:27AM.   Glucose, Serum  103   BUN  5   Creatinine (comp)  0.44   Sodium, Serum 139   Potassium, Serum  3.2   Chloride, Serum 101   CO2, Serum 29   Calcium (Total), Serum  8.0   Anion Gap 9   Osmolality (calc) 275   eGFR (African American) >60   eGFR (Non-African American) >60 (eGFR values <56m/min/1.73 m2 may be an indication of chronic kidney disease (CKD). Calculated eGFR is useful in patients with stable renal function. The eGFR calculation will not be reliable in acutely ill patients when serum creatinine is changing rapidly. It is not useful in  patients on dialysis. The eGFR calculation may not be applicable to patients at the low and high extremes of body sizes, pregnant women, and vegetarians.)  Routine Hem:  15-Jul-13 06:00    WBC (CBC)  1.7   RBC (CBC)  3.76   Hemoglobin (CBC) 14.4   Hematocrit (CBC) 41.7   Platelet Count (CBC)  31   MCV  111   MCH  38.3   MCHC 34.6   RDW 14.3   Neutrophil % 49.5   Lymphocyte % 31.7   Monocyte % 11.2   Eosinophil % 6.4   Basophil % 1.2   Neutrophil #  0.8   Lymphocyte #  0.5   Monocyte # 0.2   Eosinophil # 0.1   Basophil # 0.0   Assessment/Plan:  Assessment/Plan:   Assessment No evidence of GI  bleed. H and H stable.    Plan Agree with [place] Agree with discontinuing Sandostatin. Will follow.   Electronic Signatures: IJill Side(MD)  (Signed 15-Jul-13 19:23)  Authored: Chief Complaint, VITAL SIGNS/ANCILLARY NOTES, Brief Assessment, Lab Results, Assessment/Plan   Last Updated: 15-Jul-13 19:23 by IJill Side(MD)

## 2014-12-24 NOTE — Discharge Summary (Signed)
PATIENT NAME:  John [name], John [name] MR#:  [id number] DATE OF BIRTH:  05-21-1956  DATE OF ADMISSION:  04/17/2012 DATE OF DISCHARGE:  04/21/2012  DIAGNOSES:  1. Lower GI bleed likely due to very large hemorrhoids which bleed easily. 2. Recent admission for DTs and alcohol withdrawal.  3. Chronic thrombocytopenia.  4. Elevated AST due to alcoholic hepatitis. 5. History hepatitis B. 6. Smoking. 7. Hypomagnesemia. 8. Upper respiratory infection.   DISPOSITION: The patient is being discharged home.   FOLLOWUP: Follow up with primary care physician, Dr. Marina Gravel, and Dr. Vira Agar one to two weeks after discharge.   DIET: Regular.   ACTIVITY: As tolerated.   DISCHARGE MEDICATIONS:  1. Doxycycline 100 mg b.i.d. for seven days.  2. Omeprazole 20 mg daily.   CONSULTATION: GI consultations with Dr. Gustavo Lah and Dr. Vira Agar.   LABORATORY, DIAGNOSTIC, AND RADIOLOGICAL DATA: Chest x-ray showed no acute cardiopulmonary disease. Nuclear medicine GI bleeding scan showed activity within the pelvic structures. Extremely low GI bleeding focus cannot be excluded.   Microbiology:  C. difficile negative. Stool negative for any WBCs or RBCs.   Hemoglobin 12.1 to11.8, normal white count, platelet count 64- 66. Basic metabolic panel normal other than bilirubin 1.5, AST of 45.  Urine drug screen positive for benzodiazepines.   HOSPITAL COURSE: The patient is a 59 year old male with past medical history of alcohol abuse who recently had a prolonged hospitalization at [facility] and was subsequently discharged to [facility] for rehab. He was at home for one week and he presented with lower GI bleed. He reported both melena and bright red bleeding per rectum. During his last hospitalization he was evaluated by Dr. Dionne Milo, but his hemoglobin and hematocrit were stable and therefore no endoscopy or colonoscopy was done. The patient was admitted to the hospital. He remained hemodynamically stable with stable blood  count. A GI scan was done but was inconclusive. C. difficile was negative. A GI consultation was obtained and the patient underwent a flexible sigmoidoscopy which showed very large internal hemorrhoids which bled easily and were the likely source of bleeding. The patient also had two very small polyps which were nonbleeding. The patient will eventually require a colonoscopy. 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 Surgery was consulted. However, Dr. Marina Gravel felt that the patient had a very high risk of bleeding during hemorrhoidectomy. Therefore, he recommended conservative management so that platelet count can improve prior to any intervention. The patient has chronic thrombocytopenia; during his last hospitalization he actually had to be given one bag of platelets. He was evaluated by Dr. 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 The rest of the patient's problems remained stable. The patient did not have any evidence of alcohol withdrawal during the hospitalization. In fact, he reported he has not had anything to drink since his last hospitalization. He was coughing up a lot of clear phlegm and was found to have an upper respiratory infection and was treated with empiric antibiotics. He is being discharged home in stable condition.   TIME SPENT: 45 minutes.   ____________________________ Cherre Huger, MD sp:bjt D: 04/21/2012 15:03:29 ET T: 04/22/2012 12:29:05 ET JOB#: [id number]  cc: Cherre Huger, MD, <Dictator> Cherre Huger MD ELECTRONICALLY SIGNED 04/25/2012 14:01

## 2014-12-24 NOTE — Consult Note (Signed)
Chief Complaint:   Subjective/Chief Complaint Overall same with tremors and poor response and ? hallucinations. No BRBPR or melena.   VITAL SIGNS/ANCILLARY NOTES: **Vital Signs.:   16-Jul-13 10:00   Vital Signs Type Routine   Temperature Source oral   Pulse Pulse 112   Respirations Respirations 51   Systolic BP Systolic BP 779   Diastolic BP (mmHg) Diastolic BP (mmHg) 66   Mean BP 88   Pulse Ox % Pulse Ox % 95   Oxygen Delivery 2L   Pulse Ox Heart Rate 112   Lab Results: Hepatic:  16-Jul-13 06:19    Bilirubin, Total  3.3   Alkaline Phosphatase 112   SGPT (ALT) 76 (12-78 NOTE: NEW REFERENCE RANGE 07/30/2011)   SGOT (AST)  114   Total Protein, Serum 7.9   Albumin, Serum 3.4  Routine Chem:  16-Jul-13 06:19    Glucose, Serum  112   BUN  5   Creatinine (comp) 0.60   Sodium, Serum 140   Potassium, Serum  3.3   Chloride, Serum 102   CO2, Serum 30   Calcium (Total), Serum 8.6   Osmolality (calc) 277   eGFR (African American) >60   eGFR (Non-African American) >60 (eGFR values <52m/min/1.73 m2 may be an indication of chronic kidney disease (CKD). Calculated eGFR is useful in patients with stable renal function. The eGFR calculation will not be reliable in acutely ill patients when serum creatinine is changing rapidly. It is not useful in  patients on dialysis. The eGFR calculation may not be applicable to patients at the low and high extremes of body sizes, pregnant women, and vegetarians.)   Anion Gap 8   Magnesium, Serum  1.5 (1.8-2.4 THERAPEUTIC RANGE: 4-7 mg/dL TOXIC: > 10 mg/dL  -----------------------)  Routine Hem:  16-Jul-13 06:19    WBC (CBC)  2.4   RBC (CBC)  4.19   Hemoglobin (CBC) 15.4   Hematocrit (CBC) 46.7   Platelet Count (CBC)  31   MCV  111   MCH  36.8   MCHC 33.0   RDW 14.3   Neutrophil % 59.3   Lymphocyte % 20.2   Monocyte % 18.6   Eosinophil % 1.6   Basophil % 0.3   Neutrophil # 1.4   Lymphocyte #  0.5   Monocyte # 0.4   Eosinophil  # 0.0   Basophil # 0.0 (Result(s) reported on 21 Mar 2012 at 0[facility])   Radiology Results: [place]    16-Jul-13 11:34, [place]   [place]    REASON FOR EXAM:    Thrombocytopenia, assess for splenomegaly.  COMMENTS:       PROCEDURE: [place] - [place] GENERAL SURVEY  - Mar 21 2012 11:34AM     RESULT: Comparison: None.    Technique: Multiple grayscale and color Doppler images were obtained of  the abdomen.    Findings:  Examination is limited secondary to difficulty with patient cooperation   abnormality. The liver is relatively echogenic and dense, consistent with   hepatic steatosis. There are 2 hypoechoic, near anechoic, masses in the  liver. The mass in the right lobe of the liver measures a 1.4 x 1.3 x 1.2     cm. A mass in the left hepatic lobe measures 1.2 x 0.7 x 0.1 cm. There is   suggestion of acoustic through-transmission. These may represent cysts,   but appear to have some internal hypoechogenicity and are indeterminate.   There are several small  echogenic foci in the dependent gallbladder which   may represent gallstones versus polyps. These are nonshadowing.   Evaluation for mobility cannot be performed as the patient was unable to   be rolled. The sonographic Percell Miller sign was negative. The common bile duct   measures 5 mm.    The spleen is normal in size, measuring 1.8 cm in greatest dimension.   Images of the kidneys showed no hydronephrosis.    IMPRESSION:   1. Normal size spleen.  2. There are multiple small gallstones versus gallbladder polyps. The     patient was unable to be rolled to assess for mobility. Given the   possibility of small polyps, followup right upper quadrant ultrasound is   recommended.  3. Two small indeterminate masses in the liver which may represent cysts,   but are indeterminate. Further evaluation with contrast-enhanced CT or   MRI is suggested. At a minimum, followup ultrasound is recommended to    ensure stability.    Dictation site: 2          Verified By: Gregor Hams, M.D., MD   Assessment/Plan:  Assessment/Plan:   Assessment GI bleed. Probably hemorrhoidal. No evidence of further GI bleed and H and H is normal. Thrombocytopenia. No splenomegaly on [place] and liver does not appear frankly cirrhotis. ? hematologic disorder. Liver cysts. Mildly elevated ammonia.    Plan Advance diet. Continue lactulose and protonix. Appreciate hematology consult. AFP ordered. No further GI recommendatiions. Will sign off. Please call me if needed. Thanks.   Electronic Signatures: Jill Side (MD)  (Signed 16-Jul-13 12:47)  Authored: Chief Complaint, VITAL SIGNS/ANCILLARY NOTES, Lab Results, Radiology Results, Assessment/Plan   Last Updated: 16-Jul-13 12:47 by Jill Side (MD)

## 2014-12-24 NOTE — Consult Note (Signed)
Chief Complaint:   Subjective/Chief Complaint minimal rectal bleeding overnight, continues with some lower abdominal pain/discomfort.   VITAL SIGNS/ANCILLARY NOTES: **Vital Signs.:   15-Aug-13 06:21   Vital Signs Type Routine   Temperature Temperature (F) 98.4   Celsius 36.8   Temperature Source Oral   Pulse Pulse 76   Respirations Respirations 20   Systolic BP Systolic BP 354   Diastolic BP (mmHg) Diastolic BP (mmHg) 69   Mean BP 84   Pulse Ox % Pulse Ox % 95   Pulse Ox Activity Level  At rest   Oxygen Delivery 2L    07:30   Vital Signs Type Routine   Temperature Temperature (F) 98.1   Celsius 36.7   Temperature Source Oral   Pulse Pulse 73   Respirations Respirations 20   Systolic BP Systolic BP 656   Diastolic BP (mmHg) Diastolic BP (mmHg) 64   Mean BP 80   Pulse Ox % Pulse Ox % 96   Pulse Ox Activity Level  At rest   Oxygen Delivery Room Air/ 21 %  *Intake and Output.:   15-Aug-13 00:35   Stool  small loose light brown stool    01:51   Stool  small loose brown stool   Brief Assessment:   Cardiac Regular    Respiratory clear BS    Gastrointestinal details normal Soft  Nondistended  No masses palpable  Bowel sounds normal  minimal discomfort in the suprapubic area   Lab Results: Routine Hem:  15-Aug-13 05:47    WBC (CBC) 4.4   RBC (CBC)  3.23   Hemoglobin (CBC)  12.5   Hematocrit (CBC)  35.5   Platelet Count (CBC)  66   MCV  110   MCH  38.6   MCHC 35.1   RDW 13.9   Neutrophil % 58.2   Lymphocyte % 23.6   Monocyte % 10.4   Eosinophil % 6.6   Basophil % 1.2   Neutrophil # 2.6   Lymphocyte # 1.0   Monocyte # 0.5   Eosinophil # 0.3   Basophil # 0.1 (Result(s) reported on 20 Apr 2012 at 06:09AM.)   Assessment/Plan:  Assessment/Plan:   Assessment 1) rectal bleeding/ anal stenosis 2) history of etoh abuse 3) thrombocytopenia-likely secondary to #2    Plan 1) sedated flex sig today, further recs to follow.   Electronic Signatures: Loistine Simas (MD)  (Signed 15-Aug-13 08:17)  Authored: Chief Complaint, VITAL SIGNS/ANCILLARY NOTES, Brief Assessment, Lab Results, Assessment/Plan   Last Updated: 15-Aug-13 08:17 by Loistine Simas (MD)

## 2014-12-24 NOTE — Discharge Summary (Signed)
PATIENT NAME:  John [name], John [name] MR#:  [id number] DATE OF BIRTH:  Apr 16, 1956  DATE OF ADMISSION:  03/18/2012 DATE OF DISCHARGE:  03/28/2012  ADDENDUM: The patient has a bed available today at the skilled nursing facility. There is no change in his status. He is doing well, does not complain of any chest pain, abdominal pain. He does have some baseline tremor and extremely weak secondary to his chronic alcohol use and prolonged hospital stay. No signs of withdrawal, vitals are in the normal range, and the patient is being discharged to a skilled nursing facility with the same discharge medications and instructions, as dictated yesterday.   TIME SPENT: Time spent on the day of discharge, on discharge activity, was greater than 31 minutes. ____________________________ Leia Alf Airon Sahni, MD srs:slb D: 03/28/2012 10:34:00 ET T: 03/28/2012 10:43:05 ET JOB#: [id number]  cc: Alveta Heimlich R. Bert Givans, MD, <Dictator> Neita Carp MD ELECTRONICALLY SIGNED 04/05/2012 13:49

## 2014-12-24 NOTE — Consult Note (Signed)
Brief Consult Note: Diagnosis: Lower GI bleed.  Anemia, stable.  No evidence of acute drop after receiving IV hydration.  History of alcohol abuse and liver cirrhosis.  Chronic thrombocytopenia.  Abdominal pain.   Consult note dictated.   Discussed with Attending MD.   Comments: Patient presentation discussed with Dr. Loistine Simas.  Recommendations of continued serial monitoring of H/H.  Transfuse as necessary.  When clinically feasible recommendation is to proceed with flexible sigmoidoscopy to allow direct luminal evaluation of lower GI tract.  Differential diagnoses inclusive of diverticular, internal hemorrhoids, rectal varices or malignancy.  In agreement for the continuation of Cipro and Flagyl for antibiotic coverage as Diverticular bleed vs diverticulitis is of consideration. Positive bleeding scan results..  Electronic Signatures: Payton Emerald (NP)  (Signed [phone] 16:22)  Authored: Brief Consult Note   Last Updated: 13-Aug-13 16:22 by Payton Emerald (NP)

## 2014-12-24 NOTE — Consult Note (Signed)
Flex sig shows very large internal hemorrhoids with brisk bleeding in the anorectal area.  After exam of distal sigmoid showed no blood I kept the scope in the rectum for 3 minutes and the bleeding stopped.  Will recommend surgical consult but pt has borderline low platelet count of 60K.  Will order regular food.  Electronic Signatures: Manya Silvas (MD)  (Signed on 15-Aug-13 11:20)  Authored  Last Updated: 15-Aug-13 11:20 by Manya Silvas (MD)

## 2014-12-24 NOTE — Consult Note (Signed)
PATIENT NAME:  John [name], John [name] MR#:  [id number] DATE OF BIRTH:  1956-06-06  DATE OF CONSULTATION:  03/20/2012  REFERRING PHYSICIAN:  Dr. Loletha Grayer  CONSULTING PHYSICIAN:  Dandy Lazaro B. Bary Leriche, MD  IDENTIFYING DATA: Mr. Hattabaugh is a 59 year old male with history of alcoholism.   CHIEF COMPLAINT: "I feel shaky."   HISTORY OF PRESENT ILLNESS: Mr. Vanrossum was admitted to Critical Care Unit following an episode of lower GI bleed. He has been drinking alcohol daily at the rate of case of beer a day. He was placed on CIWA protocol in Critical Care Unit. I was asked to talk to the patient about substance abuse. He seems alert and oriented to person and place but not really able to discuss substance abuse problems as of yet. The patient confirms that he has been drinking beer daily. He has no intention to stop. One reason his drinking is that he most likely suffers familiar tremor and with a drink the tremor goes away. He reports one other family member with similar symptoms. He denies symptoms of depression, anxiety, or psychosis. No symptoms suggestive of bipolar mania. He denies other than alcohol substance use or prescription pill abuse.   PAST PSYCHIATRIC HISTORY: The patient denies any prior psychiatric hospitalization or substance abuse in treatment participation. No suicide attempts.   FAMILY PSYCHIATRIC HISTORY: None reported.   PAST MEDICAL HISTORY: 1. Hepatitis B. 2. Alcoholic liver disease. 3. Questionable brain tumor.   ALLERGIES: Codeine and morphine.   MEDICATIONS ON ADMISSION: None.   SOCIAL HISTORY: He used to be a Development worker, community. He has been on disability for the past 2 or 3 years. Disability is for mental illness as well as well as physical illness, some problems with his back. He is, however, unable to explain what mental illness has been present. He currently lives by himself in [facility]. He was married but is divorced now. He has been lately staying with his brother who no  longer believes that he would be able to take care of the patient. The patient may need placement.   REVIEW OF SYSTEMS: CONSTITUTIONAL: No fevers or chills. Positive for fatigue. EYES: No double or blurred vision. ENT: No hearing loss. RESPIRATORY: No shortness of breath or cough. CARDIOVASCULAR: No chest pain or orthopnea. GASTROINTESTINAL: No abdominal pain, nausea, vomiting. Positive for lower GI bleed. GENITOURINARY: No incontinence or frequency. ENDOCRINE: No heat or cold intolerance. LYMPHATIC: No anemia or easy bruising. INTEGUMENTARY: No acne or rash. MUSCULOSKELETAL: No muscle or joint pain. NEUROLOGIC: No tingling or weakness. PSYCHIATRIC: See history of present illness for details.   PHYSICAL EXAMINATION:  VITAL SIGNS: Blood pressure 121/43, pulse 78, respirations 23, temperature 97.8.  GENERAL: This is a well developed male, sick appearing in no acute distress. The rest of the physical examination is deferred to his primary attending.   LABORATORY, DIAGNOSTIC AND RADIOLOGICAL DATA: Chemistries: Blood glucose 103, BUN 5, creatinine 0.4, sodium 139, potassium 3.2. LFTs: Total bilirubin 3.0, alkaline phosphatase 123, AST 344, ALT 119. Troponin I elevated x3. CBC: White blood count 1.7, red blood count 3.76, hemoglobin 14.4, hematocrit 41.7, platelets 31, MCV 111. Urinalysis not done. EKG: Sinus tachycardia, otherwise normal EKG.   MENTAL STATUS EXAMINATION: The patient is alert and oriented to person and place. He is pleasant, polite, and cooperative. He is in Critical Care Unit bed breathing oxygen wearing hospital gown. He maintains good eye contact. He insists that I assessed him already. I did visit with him briefly yesterday but he was unable  to participate in interview. I cannot find any other evidence that I was in contact with this patient before. His speech is soft. Mood is depressed with flat affect. Thought processing is slow. Thought content: He denies thoughts of hurting himself or  others. There are no delusions or paranoia. There are no auditory or visual hallucinations. His cognition is grossly intact. His insight and judgment are questionable.   SUICIDE RISK ASSESSMENT: This is a patient with a long history of alcoholism and alcoholic liver disease who was admitted for GI bleed. He is in no shape to plan or execute a suicide attempt but will require support and supervision.   DIAGNOSIS:  AXIS I: Alcohol dependence.   AXIS II: Deferred.   AXIS III:  1. Alcoholic liver disease. 2. Pancytopenia. 3. Possibly familiar tremor.  4. Hepatitis.   AXIS IV: Substance abuse, physical illness, primary support.   AXIS V: GAF 35.   PLAN: Please continue CIWA protocol. The patient would benefit from residential long-term substance abuse treatment. He is in no shape to have this discussion currently. I will follow up.   ____________________________ Wardell Honour. Marqus Macphee, MD jbp:cms D: 03/20/2012 20:26:00 ET T: 03/21/2012 11:28:37 ET JOB#: [id number]  cc: Vir Whetstine B. Bary Leriche, MD, <Dictator> Clovis Fredrickson MD ELECTRONICALLY SIGNED 03/24/2012 6:07

## 2014-12-24 NOTE — Consult Note (Signed)
History of Present Illness:   Reason for Consult Leukopenia and thrombocytopenia.    HPI    Patientis a 59-year-old male past medical history significant for severe alcohol abuse and probable cirrhosis it also been complaining of dark stools per rectum.  Several days ago patient's brother found him on the floor with altered mental status.  Currently he is in the CCU.  He is lethargic but will answer several questions seemingly appropriate.  Despite this, review of systems remains to be difficult.  There are no family members are available for further information.  PFSH:   Additional Past Medical and Surgical History Past medical history: Alcohol abuse, probable cirrhosis, hepatitis B.  Past surgical history: Negative.  Social history: Positive tobacco proximally one half pack per day.  Approximately one case of beer per day.  Family history: Both mother and father with CVA.   Review of Systems:   Performance Status (ECOG) 2    Review of Systems   review of systems is difficult, but as per HPI  NURSING NOTES: **Vital Signs.:   15-Jul-13 16:00    Vital Signs Type: Routine    Temperature Temperature (F): 97.8    Celsius: 36.5    Temperature Source: axillary    Pulse Pulse: 84    Respirations Respirations: 26    Systolic BP Systolic BP: 132    Diastolic BP (mmHg) Diastolic BP (mmHg): 63    Mean BP: 86    Pulse Ox % Pulse Ox %: 96    Oxygen Delivery: 2L    Pulse Ox Heart Rate: 84   Physical Exam:   Physical Exam General: Well-developed, well-nourished, no acute distress. Lungs: Clear to auscultation bilaterally. Heart: Regular rate and rhythm. No rubs, murmurs, or gallops. Abdomen: Soft, normoactive bowel sounds. Musculoskeletal: No edema, cyanosis, or clubbing. Neuro: Lethargic. Skin: No rashes or petechiae noted. Psych: Flat affect. Lymphatics: No cervical, calvicular, axillary or inguinal LAD.    Morphine: Unknown  Codeine: Unknown  Laboratory  Results: Routine Chem:  15-Jul-13 06:00    Result Comment wbc - RESULTS VERIFIED BY REPEAT TESTING. cv - NOTIFIED OF CRITICAL VALUE  - c/erin maynor rn 03/20/12 0825 rw  - READ-BACK PROCESS PERFORMED.  Result(s) reported on 20 Mar 2012 at 08:27AM.   Glucose, Serum  103   BUN  5   Creatinine (comp)  0.44   Sodium, Serum 139   Potassium, Serum  3.2   Chloride, Serum 101   CO2, Serum 29   Calcium (Total), Serum  8.0   Anion Gap 9   Osmolality (calc) 275   eGFR (African American) >60   eGFR (Non-African American) >60 (eGFR values <60mL/min/1.73 m2 may be an indication of chronic kidney disease (CKD). Calculated eGFR is useful in patients with stable renal function. The eGFR calculation will not be reliable in acutely ill patients when serum creatinine is changing rapidly. It is not useful in  patients on dialysis. The eGFR calculation may not be applicable to patients at the low and high extremes of body sizes, pregnant women, and vegetarians.)  Routine Hem:  15-Jul-13 06:00    WBC (CBC)  1.7   RBC (CBC)  3.76   Hemoglobin (CBC) 14.4   Hematocrit (CBC) 41.7   Platelet Count (CBC)  31   MCV  111   MCH  38.3   MCHC 34.6   RDW 14.3   Neutrophil % 49.5   Lymphocyte % 31.7   Monocyte % 11.2   Eosinophil % 6.4     Basophil % 1.2   Neutrophil #  0.8   Lymphocyte #  0.5   Monocyte # 0.2   Eosinophil # 0.1   Basophil # 0.0   Assessment and Plan:  Impression:   Leukopenia and thrombocytopenia.  Plan:   1.  Leukopenia: Unclear etiology, but most likely multifactorial secondary to presumed liver disease and bone marrow suppression from heavy alcohol use.  Will get an ultrasound of the abdomen to assess for splenomegaly. Thrombocytopenia: Upon further review of his records patient had a platelet count of 55 as far back as March of 2007.  Currently, he is only mildly below his baseline over the past 6 years.  Likely multifactorial with bone marrow suppression and liver disease.  Will  get ultrasound of the abdomen as above. consult, will follow.  Electronic Signatures: Delight Hoh (MD)  (Signed 15-Jul-13 17:47)  Authored: HISTORY OF PRESENT ILLNESS, PFSH, ROS, NURSING NOTES, PE, ALLERGIES, LABS, ASSESSMENT AND PLAN   Last Updated: 15-Jul-13 17:47 by Delight Hoh (MD)

## 2014-12-24 NOTE — Consult Note (Signed)
PATIENT NAME:  John [name], LIPFORD MR#:  [id number] DATE OF BIRTH:  06-07-1956  DATE OF CONSULTATION:  04/18/2012  REFERRING PHYSICIAN:   CONSULTING PHYSICIAN:  Payton Emerald, NP  The patient's presentation was discussed with Dr. Loistine Simas. Recommendation is to continue serial monitoring of hemoglobin and hematocrit, transfuse as necessary. When clinically feasible, would recommend proceeding with flexible sigmoidoscopy to allow direct luminal evaluation of lower GI tract. Differential diagnosis inclusive of diverticular, internal hemorrhoids, rectal varices, and malignancy. In agreement with the continuation of ciprofloxacin as well as metronidazole at this time for antibiotic coverage as diverticular bleed versus diverticulosis is of consideration. Bleeding scan was positive.   These services provided by Payton Emerald, NP under collaborative agreement with Loistine Simas, MD.   ____________________________ Payton Emerald, NP dsh:drc D: 04/18/2012 16:22:54 ET T: 04/18/2012 17:45:31 ET JOB#: [id number]  cc: Payton Emerald, NP, <Dictator> Payton Emerald MD ELECTRONICALLY SIGNED 04/22/2012 14:17

## 2014-12-24 NOTE — Discharge Summary (Signed)
PATIENT NAME:  John [name], John [name] MR#:  [id number] DATE OF BIRTH:  [birth year]  DATE OF ADMISSION:  03/18/2012 DATE OF DISCHARGE:  03/27/2012  DISCHARGE DIAGNOSES:  1. Alcohol withdrawal seizures. 2. Delirium tremens. 3. Dystonia.  4. Alcoholic hepatitis.  5. Hypertension. 6. Thrombocytopenia. 7. Upper gastrointestinal bleed.  8. Leukopenia. 26. Malnourished.  10. General debility.   CONSULTS:  1. Dr. Dionne Milo of GI. 2. Dr. Ermalinda Memos of palliative care. 3. Dr. Grayland Ormond of oncology.   LABORATORY, DIAGNOSTIC AND RADIOLOGICAL DATA: CT scan of the head without contrast showed atrophy.   Chest x-ray showed no acute abnormalities.   Abdominal ultrasound showed small gallstones and gallbladder polyps.   ADMITTING HISTORY AND PHYSICAL: Please see detailed history and physical dictated on 03/18/2012. In brief, 59 year old patient with past medical history of severe alcohol abuse, cirrhosis and questionable brain tumor presented to the Emergency Room brought in for dark stools, bright red blood per rectum along with seizures. Patient had his blood work done which showed platelet count of 19,000 with elevated liver function tests and hospitalist team was consulted for further management.    HOSPITAL COURSE:  1. Alcohol withdrawal seizures. Patient had alcohol withdrawal seizures. Brought to the hospital with being seizure free in the hospital. He was not started on any antiseizure medication. Had a CT of the head done which showed no abnormalities. He was treated for his alcohol withdrawal which prevented any further seizures in the hospital.  2. Delirium tremens. Patient was in the Critical Care Unit initially with high doses of IV benzodiazepines to prevent delirium tremens, had elevated blood pressure, significant agitation, confusion which has resolved at this time.  3. Alcohol abuse. Patient has been counseled extensively to quit alcohol. Will be going to skilled nursing facility from the  hospital. His alcohol withdrawal has completely resolved.  4. Thrombocytopenia and leukopenia. Oncology was consulted for patient's thrombocytopenia which went as low as 19, received 1 unit of platelets secondary to concern for GI bleed which has resolved. Patient has had chronic thrombocytopenia in the range of 40s and is presently doing well with no bleeding or petechia which needs to be followed up after discharge.  5. Upper gastrointestinal bleed. Patient did have black stools and bright red blood per rectum as per patient and his brother at the time of discharge which has not recurred in the hospital. GI, Dr. Dionne Milo, has been consulted who suggested follow up of hemoglobin and no EGD or colonoscopy was planned as patient's bleeding had resolved.  6. Dystonia. Patient needed Haldol during the Intensive Care Unit stay for his delirium tremens which cause some dystonia with torticollis in his neck which had resolved after stopping Haldol. Cervical spine x-ray showed no fractures or degenerative disease.  7. On the day of discharge, patient still has some resting tremor. His alcohol withdrawal has completely resolved, has worked with physical therapy and recommended on going to skilled nursing facility, has no bleeding, is doing well with blood pressure 102/65, pulse 72, afebrile and is being discharged to a skilled nursing facility in stable condition.   DISCHARGE MEDICATIONS:  1. Lactulose 20 grams orally 2 times a day.  2. Metoprolol tartrate 25 mg 1/2 tablet oral 2 times a day.  3. Diazepam 2 mg oral twice a day for three days and then once a day for three days and can be stopped.   DISCHARGE INSTRUCTIONS: Discharged to skilled nursing facility for further physical therapy and care. Further follow up as per physician at  skilled nursing facility. Patient will need follow-up CBC in a week to follow up on his leukopenia and thrombocytopenia. Patient is DO NOT RESUSCITATE/DO NOT INTUBATE. He will be on  mechanical soft and thin liquids. Activity as tolerated with assistance at all times.   This plan was discussed with the patient who has verbalized understanding and is okay with the plan.   TIME SPENT: Time spent today on this discharge dictation along with coordinate care, counseling of the patient was 40 minutes.   ____________________________ Leia Alf Wilmina Maxham, MD srs:cms D: 03/27/2012 12:42:50 ET T: 03/27/2012 13:03:57 ET JOB#: [id number]  cc: Alveta Heimlich R. Geoffery Aultman, MD, <Dictator> Neita Carp MD ELECTRONICALLY SIGNED 04/05/2012 13:49

## 2014-12-24 NOTE — Consult Note (Signed)
Brief Consult Note: Diagnosis: Alcohol dependence, Alcohol withdrawal delirium.   Patient was seen by consultant.   Consult note dictated.   Recommend further assessment or treatment.   Comments: John [name] is an alcoholic. He is no shape to discuss substance abuse treatment yet. He is hallucinating today picking invisible objects in the air.  MSE: The patient believes that he is in [place].  PLAN: 1. Please continue CIWA protocol. Please use haldol if hallucinations present in addition to ativan.  2. 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   3. I will follow up.  Electronic Signatures: Orson Slick (MD)  (Signed 16-Jul-13 22:56)  Authored: Brief Consult Note   Last Updated: 16-Jul-13 22:56 by Orson Slick (MD)

## 2014-12-24 NOTE — Consult Note (Signed)
Chief Complaint:   Subjective/Chief Complaint no rectal bleeding since yesterday.  mild lower abdominal pain.   VITAL SIGNS/ANCILLARY NOTES: **Vital Signs.:   14-Aug-13 09:55   Vital Signs Type Routine   Temperature Temperature (F) 98.4   Celsius 36.8   Temperature Source Oral   Pulse Pulse 79   Respirations Respirations 20   Systolic BP Systolic BP 948   Diastolic BP (mmHg) Diastolic BP (mmHg) 68   Mean BP 81   Pulse Ox % Pulse Ox % 94   Pulse Ox Activity Level  At rest   Oxygen Delivery Room Air/ 21 %  *Intake and Output.:   14-Aug-13 01:09   Stool  small loose stool    01:38   Stool  small loose stool    02:30   Stool  medium amount of brown loose/watery stool    03:15   Stool  small loose stool    03:35   Stool  small loose stool    06:32   Stool  small loose stool    07:30   Stool  small loose stool    08:54   Stool  med loose stool    10:06   Stool  med. loose stool    11:45   Stool  small loose stool    13:40   Stool  med loose stool   Brief Assessment:   Cardiac Regular    Respiratory clear BS    Gastrointestinal details normal Soft  Nondistended  No masses palpable  Bowel sounds normal  No rebound tenderness  mild discomfort in the suprapubic area.    Additional Physical Exam rectal exam-several external skin tags.  anal stenosis that would admit the examining finger.  no other lesions noted. area very tender.  small amount of blood noted on exam.   Lab Results:  Hepatic:  13-Aug-13 05:05    Bilirubin, Total  1.6   Alkaline Phosphatase 82   SGPT (ALT) 30   SGOT (AST) 36   Total Protein, Serum 6.7   Albumin, Serum  2.3  Routine Chem:  13-Aug-13 05:05    Glucose, Serum 93   BUN  2   Creatinine (comp)  0.49   Sodium, Serum 140   Potassium, Serum 3.5   Chloride, Serum 104   CO2, Serum 29   Calcium (Total), Serum  8.0   Osmolality (calc) 275   eGFR (African American) >60   eGFR (Non-African American) >60 (eGFR values <38m/min/1.73 m2 may  be an indication of chronic kidney disease (CKD). Calculated eGFR is useful in patients with stable renal function. The eGFR calculation will not be reliable in acutely ill patients when serum creatinine is changing rapidly. It is not useful in  patients on dialysis. The eGFR calculation may not be applicable to patients at the low and high extremes of body sizes, pregnant women, and vegetarians.)   Anion Gap 7   Magnesium, Serum  1.3 (1.8-2.4 THERAPEUTIC RANGE: 4-7 mg/dL TOXIC: > 10 mg/dL  -----------------------)  Routine Hem:  13-Aug-13 05:05    Hemoglobin (CBC)  12.1   WBC (CBC) 4.9   RBC (CBC)  3.18   Hematocrit (CBC)  34.8   Platelet Count (CBC)  64   MCV  109   MCH  37.9   MCHC 34.7   RDW 13.9   Neutrophil % 67.3   Lymphocyte % 18.0   Monocyte % 10.0   Eosinophil % 4.1   Basophil % 0.6   Neutrophil # 3.3  Lymphocyte #  0.9   Monocyte # 0.5   Eosinophil # 0.2   Basophil # 0.0 (Result(s) reported on 18 Apr 2012 at 05:54AM.)  14-Aug-13 06:32    Hemoglobin (CBC)  11.8 (Result(s) reported on 19 Apr 2012 at 07:13AM.)   Assessment/Plan:  Assessment/Plan:   Assessment 1) rectal bleeding likely anal outlet.  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  Unable to do further exam today due to tenderness at the site.   2) h/o etoh abuse, none for 3 months per patient.    Plan 1) Will give some tap water enemas in am, do sedated flex sig in am at 730.   I have discussed the risks benefits and complications of flexible sigmoidoscopy  to include not limited to bleeding infection perforation and sedation and he wishes to proceed.   Electronic Signatures: Loistine Simas (MD)  (Signed 14-Aug-13 16:40)  Authored: Chief Complaint, VITAL SIGNS/ANCILLARY NOTES, Brief Assessment, Lab Results, Assessment/Plan   Last Updated: 14-Aug-13 16:40 by Loistine Simas (MD)

## 2014-12-24 NOTE — Consult Note (Signed)
Brief Consult Note: Diagnosis: Alcohol dependence.   Patient was seen by consultant.   Consult note dictated.   Recommend further assessment or treatment.   Comments: John [name] is an alcoholic. He is no shape to discuss substance abuse treatment yet.  PLAN: 1. Please continue CIWA protocol.   2. 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   3. I will follow up.  Electronic Signatures: Orson Slick (MD)  (Signed 15-Jul-13 23:40)  Authored: Brief Consult Note   Last Updated: 15-Jul-13 23:40 by Orson Slick (MD)

## 2014-12-24 NOTE — Consult Note (Signed)
Chief Complaint:   Subjective/Chief Complaint Please see full GI consult.  Patietn seen and examined.  Admittd with several days of rectal bleeding.  Patietn about 2 months ago for similar presentation.  Bleding scan showing low pelvic source, consistant with anal outlet bleeding versus rectal varices in this patient with h/o etoh abuse and platelet disorder.  Was not evaluated luminally for etiology on that visit due to thrombocytopenia.  Will plan for flexible sigmoidoscopy for tomorrow pm if platelets are adequate.  I have discussed the risks benefits and complicatiosn of flexible sigmoidoscopy to include not limited to bleeding infection perforation and sedation and he wishes to proceed.   Electronic Signatures: Loistine Simas (MD)  (Signed 13-Aug-13 19:59)  Authored: Chief Complaint   Last Updated: 13-Aug-13 19:59 by Loistine Simas (MD)

## 2014-12-29 NOTE — Consult Note (Signed)
Chief Complaint:   Subjective/Chief Complaint No further bleeding. Overall same.   VITAL SIGNS/ANCILLARY NOTES: **Vital Signs.:   14-Jul-13 10:00   Vital Signs Type Routine   Pulse Pulse 54   Respirations Respirations 32   Systolic BP Systolic BP 814   Diastolic BP (mmHg) Diastolic BP (mmHg) 62   Mean BP 94   Pulse Ox % Pulse Ox % 99   Oxygen Delivery 2L   Pulse Ox Heart Rate 54   Brief Assessment:   Additional Physical Exam Drowsy and shaky. Abdomen is soft and benign.   Lab Results: Routine Chem:  14-Jul-13 01:46    Result Comment TROPONIN - RESULTS VERIFIED BY REPEAT TESTING.  - HIGH PREVIOUSLY CALLED BY CAF AT 1421  - ON 03-18-12/[facility]  Result(s) reported on 19 Mar 2012 at 02:37AM.   Result Comment HEMOGLOBIN - DUPLICATE ORDER. CBC ORDERED  Result(s) reported on 19 Mar 2012 at 02:20AM.   Result Comment WBC - RESULTS VERIFIED BY REPEAT TESTING.  - NOTIFIED OF CRITICAL VALUE  - C/BETH BUONO AT 4818 03/19/12.PMH  - READ-BACK PROCESS PERFORMED.  - VERIFIED BY SMEAR ESTIMATE  Result(s) reported on 19 Mar 2012 at 02:35AM.   Glucose, Serum 87   BUN  6   Creatinine (comp)  0.54   Sodium, Serum 142   Potassium, Serum  3.4   Chloride, Serum 105   CO2, Serum 28   Calcium (Total), Serum  7.9   Anion Gap 9   Osmolality (calc) 280   eGFR (African American) >60   eGFR (Non-African American) >60 (eGFR values <33m/min/1.73 m2 may be an indication of chronic kidney disease (CKD). Calculated eGFR is useful in patients with stable renal function. The eGFR calculation will not be reliable in acutely ill patients when serum creatinine is changing rapidly. It is not useful in  patients on dialysis. The eGFR calculation may not be applicable to patients at the low and high extremes of body sizes, pregnant women, and vegetarians.)  Cardiac:  14-Jul-13 01:46    Troponin I  0.06 (0.00-0.05 0.05 ng/mL or less: NEGATIVE  Repeat testing in 3-6 hrs  if clinically indicated. >0.05  ng/mL: POTENTIAL  MYOCARDIAL INJURY. Repeat  testing in 3-6 hrs if  clinically indicated. NOTE: An increase or decrease  of 30% or more on serial  testing suggests a  clinically important change)  Routine Coag:  14-Jul-13 01:46    Prothrombin  15.4   INR 1.2 (INR reference interval applies to patients on anticoagulant therapy. A single INR therapeutic range for coumarins is not optimal for all indications; however, the suggested range for most indications is 2.0 - 3.0. Exceptions to the INR Reference Range may include: Prosthetic heart valves, acute myocardial infarction, prevention of myocardial infarction, and combinations of aspirin and anticoagulant. The need for a higher or lower target INR must be assessed individually. Reference: The Pharmacology and Management of the Vitamin K  antagonists: the seventh ACCP Conference on Antithrombotic and Thrombolytic Therapy. CHUDJS.9702Sept:126 (3suppl): [id number] A HCT value >55% may artifactually increase the PT.  In one study,  the increase was an average of 25%. Reference:  "Effect on Routine and Special Coagulation Testing Values of Citrate Anticoagulant Adjustment in Patients with High HCT Values." American Journal of Clinical Pathology 2006;126:400-405.)  Routine Hem:  14-Jul-13 01:46    Hemoglobin (CBC) -   Hemoglobin (CBC) 13.1   WBC (CBC)  2.0   RBC (CBC)  3.49   Hematocrit (CBC)  39.2   Platelet  Count (CBC)  32   MCV  112   MCH  37.6   MCHC 33.5   RDW  14.9   Neutrophil % 65.3   Lymphocyte % 22.3   Monocyte % 6.9   Eosinophil % 4.5   Basophil % 1.0   Neutrophil #  1.3   Lymphocyte #  0.4   Monocyte #  0.1   Eosinophil # 0.1   Basophil # 0.0   Assessment/Plan:  Assessment/Plan:   Assessment GI bleed. Most likely lower. No signs of active GI bleed and clinical picture not consistent with UGI bleeding. Thrombocytopenia, better.    Plan Follow H and H. May advance diet as tolerated. Reduce Octreotide to 25  mics per hour and DC in am if no further bleedig. Further GI workup once more stable.   Electronic Signatures: Jill Side (MD)  (Signed 14-Jul-13 10:51)  Authored: Chief Complaint, VITAL SIGNS/ANCILLARY NOTES, Brief Assessment, Lab Results, Assessment/Plan   Last Updated: 14-Jul-13 10:51 by Jill Side (MD)

## 2014-12-29 NOTE — Consult Note (Signed)
Brief Consult Note: Diagnosis: GI bleed.   Patient was seen by consultant.   Discussed with Attending MD.   Comments: Hematochezia with ? melena. Most likely UGI bleed. H and H stable. Hemodynamically stable. DT's. ? Cirrhosis. Elevated ammonia, ? encephalopathy. Elevated LFT's most likely ETOH hepatitis/cirrhosis. Thrombocytopenia.  Recommendations: Follow H and H. Agree with Sandostatin and PPI infusion. Agree with platelet transfusion. Lactulose. US liver. Will observe his hospital course and decide about further interventions as appropriate..  Electronic Signatures: Jill Side (MD)  (Signed 13-Jul-13 15:52)  Authored: Brief Consult Note   Last Updated: 13-Jul-13 15:52 by Jill Side (MD)

## 2014-12-29 NOTE — H&P (Signed)
PATIENT NAME:  John [name], John [name] MR#:  [id number] DATE OF BIRTH:  11/28/1955  DATE OF ADMISSION:  03/18/2012  PRIMARY CARE PHYSICIAN: None   CHIEF COMPLAINT: Brought in by brother secondary to being found on the ground and seizure.  HISTORY OF PRESENT ILLNESS: This is a 59 year old man with past medical history of severe alcohol abuse, probable cirrhosis, questionable history of a brain tumor. For the past few weeks he has been passing blood per rectum mostly it is bright red blood per rectum but he says he also has dark stools especially yesterday. He cannot really quantify how much blood that he has been losing or how many times he goes. Last night he had a fall, possible pass out around 2:00 a.m. Brother found him about 5:30 a.m. on the floor who messed up his pants. He saw bright red blood and some old darker clots. Patient complains of abdominal pain yesterday, nausea, vomiting yesterday but no hematemesis. In the Emergency Room he had a CT scan of the head which was negative. Found to have a platelet count of 19,000, hemoglobin of 14.5 and elevated liver function tests. Hospitalist services were contacted for further evaluation.   PAST MEDICAL HISTORY:  1. Hepatitis B.  2. Alcohol abuse, probable liver cirrhosis. 3. Leg weakness. 4. Questionable brain tumor. Will try to get records from [facility] about the brain tumor history. 5. Tobacco abuse.   PAST SURGICAL HISTORY: None.   ALLERGIES: Morphine and codeine.   MEDICATIONS AT HOME: None.   SOCIAL HISTORY: Smokes half pack per day. As per brother drinks about a case of beer per day. No drug use. Was a plumber in the past.   FAMILY HISTORY: Father with cerebrovascular accident. Mother with CVA, both living.   REVIEW OF SYSTEMS: CONSTITUTIONAL: Positive for chills. Positive for sweats. Positive for weight loss. No fever. Positive for fatigue bilateral legs. EYES: He cannot see well. He wears reading glasses. EARS, NOSE, MOUTH, AND THROAT:  Positive for runny nose. No sore throat. No difficulty swallowing. CARDIOVASCULAR: Occasional chest pain. No palpitations. RESPIRATORY: Occasional shortness of breath, coughing up whitish sometimes yellow phlegm. GASTROINTESTINAL: Occasional nausea and vomiting. Positive for abdominal pain. Positive for black stools and bright red blood per rectum. GENITOURINARY: Positive for dark urine. MUSCULOSKELETAL: Positive for weakness in the legs. INTEGUMENT: No rashes. NEUROLOGIC: Positive for fainting yesterday or pass out yesterday. PSYCHIATRIC: History of depression with old chart. ENDOCRINE: No thyroid problems. HEMATOLOGIC/LYMPHATIC: No history of anemia   PHYSICAL EXAMINATION:  VITAL SIGNS: Temperature 96.9, pulse 115, respirations 15, blood pressure 126/79, pulse oximetry 100% on oxygen.   GENERAL: No respiratory distress.   EYES: Conjunctivae normal. Lids normal. Pupils equal, round, and reactive to light. Extraocular muscles intact. No nystagmus.   EARS, NOSE, MOUTH, AND THROAT: Tympanic membrane obscured by wax. Nasal mucosa no erythema. Throat no erythema. No exudate seen.   NECK: No JVD. No bruits. No lymphadenopathy. No thyromegaly. No thyroid nodules palpated.   RESPIRATORY: Coarse breath sounds bilaterally. No rhonchi, rales, or wheeze heard.   CARDIOVASCULAR: S1, S2 tachycardic. No gallops, rubs, or murmurs heard. Carotid upstroke 2+ bilaterally. No bruits. Dorsalis pedis pulses 1+ bilaterally. Trace edema of the lower extremity.   ABDOMEN: Soft, nontender. No masses felt. Normal active bowel sounds.   LYMPHATIC: No lymph nodes in the neck.   MUSCULOSKELETAL: Trace edema. No clubbing. No cyanosis.   SKIN: Cyst on the right cheek. Some scabbing of the lower extremities.   NEUROLOGIC: Cranial nerves II through  XII grossly intact. Deep tendon reflexes 1+ bilateral lower extremity. Patient is unable to lift either leg up off the bed. Positive for tremor.   PSYCHIATRIC: Patient is alert  and answering questions, oriented to person and place.    LABORATORY, DIAGNOSTIC AND RADIOLOGICAL DATA: EKG: Sinus tachycardia at 106 beats per minute. CT scan of the head was negative. Ethanol level 49. Glucose 83, BUN 4, creatinine 0.67, sodium 141, potassium 3.7, chloride 103, CO2 26, calcium 8.2. Liver function tests: ALT 119, AST 343, albumin 3.3, INR 1.2. White blood cell count 3.0, hemoglobin and hematocrit 14.5 and 42.4, platelet count called by lab 19,000.   ASSESSMENT AND PLAN:  1. Alcohol withdrawal seizure. Will put on CIWA protocol, Precedex drip. Hold off on medications for seizure at this time. Need to get records from [facility] about this questionable history of brain tumor. Well get an EEG on Monday. If the patient has a further seizure will put on seizure medication  2. GI bleed. Patient does give a tough with bright red blood per rectum and also dark stools. Will get serial hemoglobins. Case discussed with GI, Dr. Dionne Milo, who will see the patient. Will put on empiric Protonix and octreotide drip just in case this could be an upper GI bleed but with the bright red blood per rectum most likely a lower GI bleed.  3. Severe thrombocytopenia. Looking back old history and physical back in 2007 the platelet count at that time was 44,000, now it is 19,000. I believe this is most likely secondary from alcohol abuse since this is chronic, but since the patient is actively bleeding will have to transfuse 1 unit of platelets. Benefits and risks of transfusion explained to patient and brother, will go ahead with that.  4. Probable liver cirrhosis with history of hepatitis B, continued alcohol abuse, elevated liver function tests, slightly elevated INR. Will have GI follow up on this. Must stop drinking at all costs.  5. Tobacco abuse. Smoking cessation counseling done, three minutes by me. Nicotine patch applied.  6. Low white blood cell count, most likely secondary to bone marrow suppression with the  alcohol.  7. Overall prognosis poor with continued alcohol abuse. Must stop alcohol at all costs.  8. Weakness on the legs. Will get records over from [facility] about MRI or CAT scan but this is bilaterally most likely secondary to alcohol. Will end up needing physical therapy once more stable.   TIME SPENT ON ADMISSION: 60 minutes critical care.   ____________________________ Tana Conch. Leslye Peer, MD rjw:cms D: 03/18/2012 11:49:35 ET T: 03/18/2012 12:04:02 ET JOB#: [id number]  cc: Tana Conch. Leslye Peer, MD, <Dictator>  Marisue Brooklyn MD ELECTRONICALLY SIGNED 03/20/2012 13:16

## 2014-12-29 NOTE — Consult Note (Signed)
Brief Consult Note: Diagnosis: Alcohyol dependence.   Patient was seen by consultant.   Comments: The patient asleep. Unable to interview. wiil try again tomorow.  Electronic Signatures: Orson Slick (MD)  (Signed 14-Jul-13 17:49)  Authored: Brief Consult Note   Last Updated: 14-Jul-13 17:49 by Orson Slick (MD)

## 2014-12-29 NOTE — Consult Note (Signed)
PATIENT NAME:  John [name], John [name] MR#:  [id number] DATE OF BIRTH:  [birth year]  DATE OF CONSULTATION:  03/19/2012  REFERRING PHYSICIAN:  Loletha Grayer, MD  CONSULTING PHYSICIAN:  Jill Side, MD  REASON FOR CONSULTATION: Rectal bleeding.   HISTORY OF PRESENT ILLNESS: This is a 59 year old male with history of severe alcohol abuse, probable chronic liver disease versus cirrhosis of the liver, and history of questionable brain tumor. According to the patient's brother who brought the patient to the ER, the patient has been passing some bright red blood per rectum with some dark stools. Yesterday the bleeding was significant according to him and he was brought to the Emergency Room. The patient also had a fall the night before and the brother found him on the floor at 5:30 a.m. The patient saw some blood and clots in his pants. The patient was also complaining of some abdominal pain, nausea and vomiting but no hematemesis was reported. The patient was brought to the Emergency Room where the patient was seen by Dr. Leslye Peer and I discussed the case with Dr. Leslye Peer over the phone initially. The patient's platelet count was 19,000. Hemoglobin was normal at 14.5. The patient appeared to be in DTs in the ER. It was decided to start the patient on octreotide as it was not clear how much bleeding he has had and what is the possible source of bleeding. The patient was also given a unit of platelets because of the rectal bleeding and a very low platelet count. He was admitted to the Intensive Care Unit where I evaluated him yesterday morning. He is quite obtunded. He is very shaky, appears to be in frank DTs. Almost no history is available from the patient.   PAST MEDICAL HISTORY:  1. 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 I do not know the exact status. 2. History of alcohol abuse. 3. History of questionable brain tumor.   PAST SURGICAL HISTORY: None.   ALLERGIES: Morphine and codeine.   HOME MEDICATIONS:  None. It is not clear if he uses any nonsteroidals or not.   SOCIAL HISTORY: He smokes about half a pack a day. According to his brother, he drinks about a case of beer per day.   REVIEW OF SYSTEMS: Not available to me.   PHYSICAL EXAMINATION:   GENERAL: Man in poor health. He is quite obtunded, visibly shaky. He clinically does not appear to be jaundiced.   VITAL SIGNS: Temperature 98. Heart rate is in 50's and 60's. Blood pressure has been stable around 140/90.   LUNGS: Grossly clear to auscultation bilaterally with fair air entry.   CARDIOVASCULAR: Regular rate and rhythm. No gallops or murmurs were heard.   ABDOMEN: Somewhat distended abdomen. I did not feel any hepatosplenomegaly. No ascites was noted. Bowel sounds are sluggish.   NEUROLOGIC: Gross tremors of both upper extremities. He is pretty sleepy, drowsy, and lethargic.   LABORATORY, DIAGNOSTIC, AND RADIOLOGICAL DATA: White cell count 3, hemoglobin 14.5, hematocrit 42.4, platelet count 19,000. Repeat hemoglobin today after 24 hours is 13.1. White cell count is low at 2. Serum ethanol liver is 49. Electrolytes are fairly unremarkable. Total bilirubin 3, alkaline phosphatase 123, ALT 119, AST 343. Troponin 0.06.  CT of head did not show any acute findings.   ASSESSMENT AND PLAN: The patient is with rectal bleeding. It is not clear exactly what is the source of bleeding as a good history is not available. The patient was hemodynamically stable on admission and his hemoglobin and hematocrit were  normal and remains normal. Initially the patient was started on IV octreotide and IV PPI although this appears to be more a lower GI bleed most likely from diverticulosis or hemorrhoids. The patient has not had any further bleeding since admission. I would recommend gradually tapering him off the IV Sandostatin as there is no evidence of variceal or upper GI bleed. Will continue him on IV PPI at this point. Follow hemoglobin and hematocrit and  transfuse if needed. Once the patient's mental status improves, the patient will probably benefit from an upper and lower GI endoscopy, although at this point it would be very difficult to prep the patient for a colonoscopy. The patient also has abnormal liver enzymes which seems to be consistent with his history of drinking. Will obtain an ultrasound of the liver for further evaluation as well as acute hepatitis panel as he has questionable history of Hepatitis B in the past.   Case has been discussed with Dr. Loletha Grayer. Will follow and make further recommendations.   ____________________________ Jill Side, MD si:drc D: 03/19/2012 09:07:29 ET T: 03/19/2012 11:31:53 ET JOB#: [id number]  cc: Jill Side, MD, <Dictator> Jill Side MD ELECTRONICALLY SIGNED 04/07/2012 13:23

## 2015-01-07 DIAGNOSIS — G5603 Carpal tunnel syndrome, bilateral upper limbs: Secondary | ICD-10-CM | POA: Insufficient documentation

## 2015-01-07 DIAGNOSIS — G5600 Carpal tunnel syndrome, unspecified upper limb: Secondary | ICD-10-CM | POA: Insufficient documentation

## 2015-02-26 ENCOUNTER — Ambulatory Visit: Payer: Self-pay | Admitting: Family Medicine

## 2015-02-26 ENCOUNTER — Other Ambulatory Visit: Payer: Self-pay

## 2015-03-06 ENCOUNTER — Ambulatory Visit: Payer: Self-pay | Admitting: Family Medicine

## 2015-03-06 ENCOUNTER — Other Ambulatory Visit: Payer: Self-pay

## 2015-03-20 ENCOUNTER — Encounter: Payer: Self-pay | Admitting: Family Medicine

## 2015-03-20 ENCOUNTER — Inpatient Hospital Stay (HOSPITAL_BASED_OUTPATIENT_CLINIC_OR_DEPARTMENT_OTHER): Payer: Medicare PPO | Admitting: Family Medicine

## 2015-03-20 ENCOUNTER — Inpatient Hospital Stay: Payer: Medicare PPO | Attending: Family Medicine

## 2015-03-20 VITALS — BP 116/71 | HR 67 | Temp 96.9°F | Resp 16 | Wt 206.8 lb

## 2015-03-20 DIAGNOSIS — Z79899 Other long term (current) drug therapy: Secondary | ICD-10-CM

## 2015-03-20 DIAGNOSIS — G629 Polyneuropathy, unspecified: Secondary | ICD-10-CM | POA: Diagnosis not present

## 2015-03-20 DIAGNOSIS — D696 Thrombocytopenia, unspecified: Secondary | ICD-10-CM | POA: Insufficient documentation

## 2015-03-20 LAB — CBC WITH DIFFERENTIAL/PLATELET
Basophils Absolute: 0 10*3/uL (ref 0–0.1)
Basophils Relative: 0 %
EOS PCT: 7 %
Eosinophils Absolute: 0.3 10*3/uL (ref 0–0.7)
HCT: 41.1 % (ref 40.0–52.0)
Hemoglobin: 14 g/dL (ref 13.0–18.0)
Lymphocytes Relative: 20 %
Lymphs Abs: 1 10*3/uL (ref 1.0–3.6)
MCH: 33.2 pg (ref 26.0–34.0)
MCHC: 34 g/dL (ref 32.0–36.0)
MCV: 97.7 fL (ref 80.0–100.0)
MONO ABS: 0.4 10*3/uL (ref 0.2–1.0)
Monocytes Relative: 8 %
Neutro Abs: 3.4 10*3/uL (ref 1.4–6.5)
Neutrophils Relative %: 65 %
PLATELETS: 68 10*3/uL — AB (ref 150–440)
RBC: 4.21 MIL/uL — AB (ref 4.40–5.90)
RDW: 14.2 % (ref 11.5–14.5)
WBC: 5.2 10*3/uL (ref 3.8–10.6)

## 2015-03-20 NOTE — Progress Notes (Signed)
[place]  [phone]  [phone]) [phone]     John [name] DOB: 1956/02/12  MR#: [id number]  EXN#:[id number]  Patient Care Team: Cletis Athens, MD as PCP - General (Internal Medicine)  CHIEF COMPLAINT:  Chief Complaint  Patient presents with  . Follow-up    c/o left chest pain since Saturday night (thought it was acid reflux); chest has been sore since...   Patient is here for further evaluation of thrombocytopenia.  INTERVAL HISTORY:  Patient is here for further evaluation and treatment duration regarding thrombocytopenia. 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 Patient reports he has had no alcohol intake since July 2013. Patient was previously tested and determined to be negative for hepatitis and HIV in 2014. Today he reports overall feeling well. He did have some chest pains Saturday evening, this has resolved.  REVIEW OF SYSTEMS:   Review of Systems  Constitutional: Negative for fever, chills, weight loss, malaise/fatigue and diaphoresis.  HENT: Negative for congestion, ear discharge, ear pain, hearing loss, nosebleeds, sore throat and tinnitus.   Eyes: Negative for blurred vision, double vision, photophobia, pain, discharge and redness.  Respiratory: Negative for cough, hemoptysis, sputum production, shortness of breath, wheezing and stridor.   Cardiovascular: Negative for chest pain, palpitations, orthopnea, claudication, leg swelling and PND.  Gastrointestinal: Negative for heartburn, nausea, vomiting, abdominal pain, diarrhea, constipation, blood in stool and melena.  Genitourinary: Negative.   Musculoskeletal: Negative.   Skin: Negative.   Neurological: Negative for dizziness, tingling, focal weakness, seizures, weakness and headaches.  Endo/Heme/Allergies: Does not bruise/bleed easily.  Psychiatric/Behavioral: Negative for depression. The patient is not nervous/anxious and  does not have insomnia.     As per HPI. Otherwise, a complete review of systems is negatve.  ONCOLOGY HISTORY:  No history exists.    PAST MEDICAL HISTORY: No past medical history on file.  PAST SURGICAL HISTORY: No past surgical history on file.  FAMILY HISTORY No family history on file.  GYNECOLOGIC HISTORY:  No LMP for male patient.     ADVANCED DIRECTIVES:    HEALTH MAINTENANCE: History  Substance Use Topics  . Smoking status: Not on file  . Smokeless tobacco: Not on file  . Alcohol Use: Not on file     Colonoscopy:  PAP:  Bone density:  Lipid panel:  Allergies  Allergen Reactions  . Codeine Sulfate Nausea Only  . Morphine Nausea Only  . Duloxetine Rash    Made me sick    Current Outpatient Prescriptions  Medication Sig Dispense Refill  . baclofen (LIORESAL) 10 MG tablet Take 10 mg by mouth 3 (three) times daily.    Marland Kitchen esomeprazole (NEXIUM) 20 MG capsule Take 20 mg by mouth daily at 12 noon.    . folic acid (FOLVITE) 1 MG tablet Take 1 mg by mouth.    . gabapentin (NEURONTIN) 300 MG capsule Take 1,800 mg by mouth 3 (three) times daily.    . nortriptyline (PAMELOR) 50 MG capsule Take 50 mg by mouth at bedtime.    Marland Kitchen oxycodone-acetaminophen (PERCOCET) 2.5-325 MG per tablet Take 1 tablet by mouth every 12 (twelve) hours.    . ranitidine (ZANTAC) 75 MG tablet Take 75 mg by mouth daily.     No current facility-administered medications for this visit.    OBJECTIVE: BP 116/71 mmHg  Pulse 67  Temp(Src) 96.9 F (36.1 C) (Tympanic)  Resp 16  Wt 206 lb 12.7 oz (93.8 kg)  There is no height on file to calculate BMI.    ECOG FS:0 - Asymptomatic  General: Well-developed, well-nourished, no acute distress. Eyes: Pink conjunctiva, anicteric sclera. HEENT: Normocephalic, moist mucous membranes, clear oropharnyx. Lungs: Clear to auscultation bilaterally. Heart: Regular rate and rhythm. No rubs, murmurs, or gallops. Abdomen: Soft, nontender, nondistended. No  organomegaly noted, normoactive bowel sounds. Musculoskeletal: No edema, cyanosis, or clubbing. Neuro: Alert, answering all questions appropriately. Cranial nerves grossly intact. Skin: No rashes or petechiae noted. Psych: Normal affect.   LAB RESULTS:  Appointment on 03/20/2015  Component Date Value Ref Range Status  . WBC 03/20/2015 5.2  3.8 - 10.6 K/uL Final  . RBC 03/20/2015 4.21* 4.40 - 5.90 MIL/uL Final  . Hemoglobin 03/20/2015 14.0  13.0 - 18.0 g/dL Final  . HCT 03/20/2015 41.1  40.0 - 52.0 % Final  . MCV 03/20/2015 97.7  80.0 - 100.0 fL Final  . MCH 03/20/2015 33.2  26.0 - 34.0 pg Final  . MCHC 03/20/2015 34.0  32.0 - 36.0 g/dL Final  . RDW 03/20/2015 14.2  11.5 - 14.5 % Final  . Platelets 03/20/2015 68* 150 - 440 K/uL Final  . Neutrophils Relative % 03/20/2015 65   Final  . Neutro Abs 03/20/2015 3.4  1.4 - 6.5 K/uL Final  . Lymphocytes Relative 03/20/2015 20   Final  . Lymphs Abs 03/20/2015 1.0  1.0 - 3.6 K/uL Final  . Monocytes Relative 03/20/2015 8   Final  . Monocytes Absolute 03/20/2015 0.4  0.2 - 1.0 K/uL Final  . Eosinophils Relative 03/20/2015 7   Final  . Eosinophils Absolute 03/20/2015 0.3  0 - 0.7 K/uL Final  . Basophils Relative 03/20/2015 0   Final  . Basophils Absolute 03/20/2015 0.0  0 - 0.1 K/uL Final    STUDIES: No results found.  ASSESSMENT: thrombocytopenia  PLAN:   1. Thrombocytopenia. Platelets are overall stable, 68K. Thrombocytopenia was previously attributed to alcohol abuse. Patient has not had any alcoholic intake since July 2013. Of note patient previously trial of prednisone with no effect. He has had a follow-up ultrasound in April 2016 to evaluate for splenomegaly as well as follow-up on small hepatic cysts, all of which were stated to be stable and unchanged. Her Dr. Inez Pilgrim he would like to repeat abdominal ultrasound on a yearly basis. Patient advised to avoid the use of NSAIDs.  Patient with severe degenerative disc disease, spinal  stenosis, peripheral neuropathy. Patient is followed by neurology as well as the pain clinic.  We will continue to follow labs every 2 months with follow-up again in 6 months.  Patient expressed understanding and was in agreement with this plan. He also understands that He can call clinic at any time with any questions, concerns, or complaints.   Dr. Oliva Bustard was available for consultation and review of plan of care for this patient.   Evlyn Kanner, NP   03/20/2015 11:24 AM

## 2015-03-21 ENCOUNTER — Other Ambulatory Visit: Payer: Self-pay

## 2015-03-21 ENCOUNTER — Ambulatory Visit: Payer: Self-pay | Admitting: Family Medicine

## 2015-05-15 ENCOUNTER — Inpatient Hospital Stay: Payer: Medicare PPO | Attending: Family Medicine

## 2015-05-15 DIAGNOSIS — D696 Thrombocytopenia, unspecified: Secondary | ICD-10-CM

## 2015-05-15 LAB — CBC WITH DIFFERENTIAL/PLATELET
BASOS PCT: 0 %
Basophils Absolute: 0 10*3/uL (ref 0–0.1)
Eosinophils Absolute: 0.7 10*3/uL (ref 0–0.7)
Eosinophils Relative: 15 %
HCT: 39.3 % — ABNORMAL LOW (ref 40.0–52.0)
HEMOGLOBIN: 13.4 g/dL (ref 13.0–18.0)
Lymphocytes Relative: 26 %
Lymphs Abs: 1.1 10*3/uL (ref 1.0–3.6)
MCH: 33.2 pg (ref 26.0–34.0)
MCHC: 34.1 g/dL (ref 32.0–36.0)
MCV: 97.4 fL (ref 80.0–100.0)
Monocytes Absolute: 0.3 10*3/uL (ref 0.2–1.0)
Monocytes Relative: 8 %
NEUTROS ABS: 2.2 10*3/uL (ref 1.4–6.5)
NEUTROS PCT: 51 %
Platelets: 60 10*3/uL — ABNORMAL LOW (ref 150–440)
RBC: 4.03 MIL/uL — AB (ref 4.40–5.90)
RDW: 14.5 % (ref 11.5–14.5)
WBC: 4.3 10*3/uL (ref 3.8–10.6)

## 2015-06-23 ENCOUNTER — Encounter: Payer: Self-pay | Admitting: Pain Medicine

## 2015-06-23 ENCOUNTER — Ambulatory Visit: Payer: Medicare PPO | Attending: Pain Medicine | Admitting: Pain Medicine

## 2015-06-23 VITALS — BP 118/56 | HR 64 | Temp 97.9°F | Resp 18 | Ht 72.0 in | Wt 205.0 lb

## 2015-06-23 DIAGNOSIS — M47816 Spondylosis without myelopathy or radiculopathy, lumbar region: Secondary | ICD-10-CM | POA: Diagnosis not present

## 2015-06-23 DIAGNOSIS — M5441 Lumbago with sciatica, right side: Secondary | ICD-10-CM

## 2015-06-23 DIAGNOSIS — F1190 Opioid use, unspecified, uncomplicated: Secondary | ICD-10-CM | POA: Diagnosis not present

## 2015-06-23 DIAGNOSIS — G8929 Other chronic pain: Secondary | ICD-10-CM | POA: Diagnosis not present

## 2015-06-23 DIAGNOSIS — G40909 Epilepsy, unspecified, not intractable, without status epilepticus: Secondary | ICD-10-CM

## 2015-06-23 DIAGNOSIS — K7460 Unspecified cirrhosis of liver: Secondary | ICD-10-CM | POA: Insufficient documentation

## 2015-06-23 DIAGNOSIS — F32A Depression, unspecified: Secondary | ICD-10-CM | POA: Insufficient documentation

## 2015-06-23 DIAGNOSIS — M5442 Lumbago with sciatica, left side: Secondary | ICD-10-CM

## 2015-06-23 DIAGNOSIS — M47896 Other spondylosis, lumbar region: Secondary | ICD-10-CM | POA: Insufficient documentation

## 2015-06-23 DIAGNOSIS — G621 Alcoholic polyneuropathy: Secondary | ICD-10-CM | POA: Insufficient documentation

## 2015-06-23 DIAGNOSIS — M109 Gout, unspecified: Secondary | ICD-10-CM | POA: Insufficient documentation

## 2015-06-23 DIAGNOSIS — M549 Dorsalgia, unspecified: Secondary | ICD-10-CM | POA: Diagnosis present

## 2015-06-23 DIAGNOSIS — Z79891 Long term (current) use of opiate analgesic: Secondary | ICD-10-CM | POA: Insufficient documentation

## 2015-06-23 DIAGNOSIS — I10 Essential (primary) hypertension: Secondary | ICD-10-CM | POA: Insufficient documentation

## 2015-06-23 DIAGNOSIS — F329 Major depressive disorder, single episode, unspecified: Secondary | ICD-10-CM | POA: Insufficient documentation

## 2015-06-23 DIAGNOSIS — K259 Gastric ulcer, unspecified as acute or chronic, without hemorrhage or perforation: Secondary | ICD-10-CM | POA: Insufficient documentation

## 2015-06-23 DIAGNOSIS — M545 Low back pain, unspecified: Secondary | ICD-10-CM

## 2015-06-23 DIAGNOSIS — D696 Thrombocytopenia, unspecified: Secondary | ICD-10-CM

## 2015-06-23 DIAGNOSIS — M4727 Other spondylosis with radiculopathy, lumbosacral region: Secondary | ICD-10-CM | POA: Insufficient documentation

## 2015-06-23 DIAGNOSIS — K219 Gastro-esophageal reflux disease without esophagitis: Secondary | ICD-10-CM | POA: Insufficient documentation

## 2015-06-23 DIAGNOSIS — J449 Chronic obstructive pulmonary disease, unspecified: Secondary | ICD-10-CM | POA: Diagnosis not present

## 2015-06-23 DIAGNOSIS — Z720 Tobacco use: Secondary | ICD-10-CM | POA: Insufficient documentation

## 2015-06-23 DIAGNOSIS — M5416 Radiculopathy, lumbar region: Secondary | ICD-10-CM

## 2015-06-23 DIAGNOSIS — M5412 Radiculopathy, cervical region: Secondary | ICD-10-CM | POA: Insufficient documentation

## 2015-06-23 DIAGNOSIS — K860 Alcohol-induced chronic pancreatitis: Secondary | ICD-10-CM | POA: Insufficient documentation

## 2015-06-23 DIAGNOSIS — F1120 Opioid dependence, uncomplicated: Secondary | ICD-10-CM

## 2015-06-23 DIAGNOSIS — F17210 Nicotine dependence, cigarettes, uncomplicated: Secondary | ICD-10-CM | POA: Diagnosis not present

## 2015-06-23 DIAGNOSIS — S0990XA Unspecified injury of head, initial encounter: Secondary | ICD-10-CM

## 2015-06-23 DIAGNOSIS — Z87898 Personal history of other specified conditions: Secondary | ICD-10-CM

## 2015-06-23 DIAGNOSIS — G629 Polyneuropathy, unspecified: Secondary | ICD-10-CM

## 2015-06-23 DIAGNOSIS — G4733 Obstructive sleep apnea (adult) (pediatric): Secondary | ICD-10-CM

## 2015-06-23 DIAGNOSIS — Z8659 Personal history of other mental and behavioral disorders: Secondary | ICD-10-CM | POA: Insufficient documentation

## 2015-06-23 DIAGNOSIS — M4726 Other spondylosis with radiculopathy, lumbar region: Secondary | ICD-10-CM

## 2015-06-23 DIAGNOSIS — G249 Dystonia, unspecified: Secondary | ICD-10-CM | POA: Insufficient documentation

## 2015-06-23 DIAGNOSIS — M47812 Spondylosis without myelopathy or radiculopathy, cervical region: Secondary | ICD-10-CM | POA: Insufficient documentation

## 2015-06-23 DIAGNOSIS — Z91041 Radiographic dye allergy status: Secondary | ICD-10-CM | POA: Insufficient documentation

## 2015-06-23 DIAGNOSIS — F1010 Alcohol abuse, uncomplicated: Secondary | ICD-10-CM | POA: Insufficient documentation

## 2015-06-23 DIAGNOSIS — R251 Tremor, unspecified: Secondary | ICD-10-CM | POA: Insufficient documentation

## 2015-06-23 DIAGNOSIS — M542 Cervicalgia: Secondary | ICD-10-CM

## 2015-06-23 DIAGNOSIS — Z5181 Encounter for therapeutic drug level monitoring: Secondary | ICD-10-CM | POA: Insufficient documentation

## 2015-06-23 DIAGNOSIS — B1920 Unspecified viral hepatitis C without hepatic coma: Secondary | ICD-10-CM | POA: Insufficient documentation

## 2015-06-23 DIAGNOSIS — K5909 Other constipation: Secondary | ICD-10-CM | POA: Insufficient documentation

## 2015-06-23 DIAGNOSIS — D72819 Decreased white blood cell count, unspecified: Secondary | ICD-10-CM | POA: Insufficient documentation

## 2015-06-23 HISTORY — DX: Unspecified cirrhosis of liver: K74.60

## 2015-06-23 HISTORY — DX: Opioid dependence, uncomplicated: F11.20

## 2015-06-23 HISTORY — DX: Epilepsy, unspecified, not intractable, without status epilepticus: G40.909

## 2015-06-23 HISTORY — DX: Alcoholic polyneuropathy: G62.1

## 2015-06-23 HISTORY — DX: Other spondylosis with radiculopathy, lumbar region: M47.26

## 2015-06-23 HISTORY — DX: Personal history of other specified conditions: Z87.898

## 2015-06-23 HISTORY — DX: Gastric ulcer, unspecified as acute or chronic, without hemorrhage or perforation: K25.9

## 2015-06-23 HISTORY — DX: Unspecified injury of head, initial encounter: S09.90XA

## 2015-06-23 HISTORY — DX: Thrombocytopenia, unspecified: D69.6

## 2015-06-23 HISTORY — DX: Alcohol-induced chronic pancreatitis: K86.0

## 2015-06-23 HISTORY — DX: Polyneuropathy, unspecified: G62.9

## 2015-06-23 HISTORY — DX: Other spondylosis with radiculopathy, lumbosacral region: M47.27

## 2015-06-23 HISTORY — DX: Obstructive sleep apnea (adult) (pediatric): G47.33

## 2015-06-23 MED ORDER — OXYCODONE-ACETAMINOPHEN 2.5-325 MG PO TABS: 1.0000 | ORAL_TABLET | Freq: Two times a day (BID) | ORAL | Status: DC | PRN

## 2015-06-23 MED ORDER — OXYCODONE-ACETAMINOPHEN 2.5-325 MG PO TABS: 1.0000 | ORAL_TABLET | Freq: Two times a day (BID) | ORAL | Status: DC

## 2015-06-23 NOTE — Patient Instructions (Signed)
Smoking Cessation, Tips for Success If you are ready to quit smoking, congratulations! You have chosen to help yourself be healthier. Cigarettes bring nicotine, tar, carbon monoxide, and other irritants into your body. Your lungs, heart, and blood vessels will be able to work better without these poisons. There are many different ways to quit smoking. Nicotine gum, nicotine patches, a nicotine inhaler, or nicotine nasal spray can help with physical craving. Hypnosis, support groups, and medicines help break the habit of smoking. WHAT THINGS CAN I DO TO MAKE QUITTING EASIER?  Here are some tips to help you quit for good:  Pick a date when you will quit smoking completely. Tell all of your friends and family about your plan to quit on that date.  Do not try to slowly cut down on the number of cigarettes you are smoking. Pick a quit date and quit smoking completely starting on that day.  Throw away all cigarettes.   Clean and remove all ashtrays from your home, work, and car.  On a card, write down your reasons for quitting. Carry the card with you and read it when you get the urge to smoke.  Cleanse your body of nicotine. Drink enough water and fluids to keep your urine clear or pale yellow. Do this after quitting to flush the nicotine from your body.  Learn to predict your moods. Do not let a bad situation be your excuse to have a cigarette. Some situations in your life might tempt you into wanting a cigarette.  Never have "just one" cigarette. It leads to wanting another and another. Remind yourself of your decision to quit.  Change habits associated with smoking. If you smoked while driving or when feeling stressed, try other activities to replace smoking. Stand up when drinking your coffee. Brush your teeth after eating. Sit in a different chair when you read the paper. Avoid alcohol while trying to quit, and try to drink fewer caffeinated beverages. Alcohol and caffeine may urge you to  smoke.  Avoid foods and drinks that can trigger a desire to smoke, such as sugary or spicy foods and alcohol.  Ask people who smoke not to smoke around you.  Have something planned to do right after eating or having a cup of coffee. For example, plan to take a walk or exercise.  Try a relaxation exercise to calm you down and decrease your stress. Remember, you may be tense and nervous for the first 2 weeks after you quit, but this will pass.  Find new activities to keep your hands busy. Play with a pen, coin, or rubber band. Doodle or draw things on paper.  Brush your teeth right after eating. This will help cut down on the craving for the taste of tobacco after meals. You can also try mouthwash.   Use oral substitutes in place of cigarettes. Try using lemon drops, carrots, cinnamon sticks, or chewing gum. Keep them handy so they are available when you have the urge to smoke.  When you have the urge to smoke, try deep breathing.  Designate your home as a nonsmoking area.  If you are a heavy smoker, ask your health care provider about a prescription for nicotine chewing gum. It can ease your withdrawal from nicotine.  Reward yourself. Set aside the cigarette money you save and buy yourself something nice.  Look for support from others. Join a support group or smoking cessation program. Ask someone at home or at work to help you with your plan   to quit smoking.  Always ask yourself, "Do I need this cigarette or is this just a reflex?" Tell yourself, "Today, I choose not to smoke," or "I do not want to smoke." You are reminding yourself of your decision to quit.  Do not replace cigarette smoking with electronic cigarettes (commonly called e-cigarettes). The safety of e-cigarettes is unknown, and some may contain harmful chemicals.  If you relapse, do not give up! Plan ahead and think about what you will do the next time you get the urge to smoke. HOW WILL I FEEL WHEN I QUIT SMOKING? You  may have symptoms of withdrawal because your body is used to nicotine (the addictive substance in cigarettes). You may crave cigarettes, be irritable, feel very hungry, cough often, get headaches, or have difficulty concentrating. The withdrawal symptoms are only temporary. They are strongest when you first quit but will go away within 10-14 days. When withdrawal symptoms occur, stay in control. Think about your reasons for quitting. Remind yourself that these are signs that your body is healing and getting used to being without cigarettes. Remember that withdrawal symptoms are easier to treat than the major diseases that smoking can cause.  Even after the withdrawal is over, expect periodic urges to smoke. However, these cravings are generally short lived and will go away whether you smoke or not. Do not smoke! WHAT RESOURCES ARE AVAILABLE TO HELP ME QUIT SMOKING? Your health care provider can direct you to community resources or hospitals for support, which may include:  Group support.  Education.  Hypnosis.  Therapy.   This information is not intended to replace advice given to you by your health care provider. Make sure you discuss any questions you have with your health care provider.   Document Released: 05/21/2004 Document Revised: 09/13/2014 Document Reviewed: 02/08/2013 Elsevier Interactive Patient Education 2016 [employer]  

## 2015-06-23 NOTE — Progress Notes (Signed)
Safety precautions to be maintained throughout the outpatient stay will include: orient to surroundings, keep bed in low position, maintain call bell within reach at all times, provide assistance with transfer out of bed and ambulation. Oxycodone pill count # 9.5

## 2015-06-23 NOTE — Progress Notes (Signed)
Patient's Name: John [name] MRN: [id number] DOB: Feb 11, 1956 DOS: 06/23/2015  Primary Reason(s) for Visit: Encounter for Medication Management. CC: Back Pain   HPI:   John [name] is a 59 y.o. year old, male patient, who returns today as an established patient. He has Encounter for therapeutic drug level monitoring; Long term current use of opiate analgesic; Uncomplicated opioid dependence ([place]); Opiate use; Lumbar spondylosis; Chronic low back pain; Allergy history, radiographic dye; Thrombocytopenia ([place]); AA (alcohol abuse); Back ache; Carpal tunnel syndrome; Clinical depression; Difficulty in walking; Dystonia; Extremity pain; Foot pain; Gastric ulcer; HCV (hepatitis C virus); H/O neoplasm; BP (high blood pressure); Decreased leukocytes; Hepatic cirrhosis ([place]); LBP (low back pain); Cervical pain; Loss of feeling or sensation; Absence of sensation; Current tobacco use; Has a tremor; Peripheral neuropathy ([place]); Osteoarthritis of spine with radiculopathy, lumbar region; Osteoarthritis of spine with radiculopathy, lumbosacral region; Alcoholic peripheral neuropathy ([place]); Chronic neck pain; Cervical spondylosis; Obstructive sleep apnea; History of panic attacks; GERD (gastroesophageal reflux disease); Chronic constipation; 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 His primarily concern today is the Back Pain   The patient comes in for his medication management. He is not having any Problems with his medications except for the fact that they'll 1 pill that he takes at night he claims that he has a mild rash with it. I really think that it's the pill since it would do the same during the day and he may he refers that it doesn't. In any case, he is doing  well and therefore we will make no changes to the therapy.  Pharmacotherapy Review: Side-effects or Adverse reactions: None reported. Effectiveness: Described as relatively effective, allowing for increase in activities of daily living (ADL). Onset of action: Within expected pharmacological parameters. Duration of action: Within normal limits for medication. Peak effect: Timing and results are as within normal expected parameters. [place] PMP: Compliant with practice rules and regulations. DST: Compliant with practice rules and regulations. Lab work: No new labs ordered by our practice. Treatment compliance: Compliant. Substance Use Disorder (SUD) Risk Level: Low Planned course of action: Continue therapy as is.  Allergies: John [name] is allergic to codeine sulfate; morphine; and duloxetine.  Meds: The patient has a current medication list which includes the following prescription(s): baclofen, esomeprazole, folic acid, gabapentin, nortriptyline, oxycodone-acetaminophen, ranitidine, gabapentin, oxycodone-acetaminophen, and oxycodone-acetaminophen. Requested Prescriptions   Signed Prescriptions Disp Refills  . oxycodone-acetaminophen (PERCOCET) 2.5-325 MG tablet 60 tablet 0    Sig: Take 1 tablet by mouth every 12 (twelve) hours.  Marland Kitchen oxycodone-acetaminophen (PERCOCET) 2.5-325 MG tablet 60 tablet 0    Sig: Take 1 tablet by mouth every 12 (twelve) hours as needed for pain.  Marland Kitchen oxycodone-acetaminophen (PERCOCET) 2.5-325 MG tablet 60 tablet 0    Sig: Take 1 tablet by mouth every 12 (twelve) hours as needed for pain.    ROS: Constitutional: Afebrile, no chills, well hydrated and well nourished Gastrointestinal: negative Musculoskeletal:negative Neurological: negative Behavioral/Psych: negative  PFSH: Medical:  John [name]  has a past medical history of Anxiety; Depression; COPD (chronic obstructive pulmonary disease) ([place]); Hypertension; Hemorrhoids; History of GI bleed; Hepatitis B;  Leukopenia; Gout; Pulse irregularity; Seizures ([place]); Dystonia; Spinal stenosis; and Head injury (06/23/2015). Family: family history includes Cancer in his father; Cancer (age of onset: 69) in his brother; Heart disease in his father;  Stroke in his father and mother. Surgical:  has past surgical history that includes surgery for cervical neck fracture and Esophagogastroduodenoscopy (11/2013). Tobacco:  reports that he has been smoking Cigarettes.  He has a 35 pack-year smoking history. He does not have any smokeless tobacco history on file. Alcohol:  reports that he does not drink alcohol. Drug:  has no drug history on file.  Physical Exam: Vitals:  Today's Vitals   06/23/15 1008 06/23/15 1010  BP: 118/56   Pulse: 64   Temp: 97.9 F (36.6 C)   Resp: 18   Height: 6' (1.829 m)   Weight: 205 lb (92.987 kg)   SpO2: 98%   PainSc: 5  5   PainLoc: Back   Calculated BMI: Body mass index is 27.8 kg/(m^2). General appearance: alert, cooperative, appears stated age and mild distress Eyes: conjunctivae/corneas clear. PERRL, EOM's intact. Fundi benign. Lungs: No evidence respiratory distress, no audible rales or ronchi and no use of accessory muscles of respiration Neck: no adenopathy, no carotid bruit, no JVD, supple, symmetrical, trachea midline and thyroid not enlarged, symmetric, no tenderness/mass/nodules Back: symmetric, no curvature. ROM normal. No CVA tenderness. Extremities: extremities normal, atraumatic, no cyanosis or edema Pulses: 2+ and symmetric Skin: Skin color, texture, turgor normal. No rashes or lesions Neurologic: Gait: Antalgic    Assessment: Encounter Diagnosis:  Primary Diagnosis: Chronic low back pain [M54.5, G89.29]  Plan: [name] was seen today for back pain.  Diagnoses and all orders for this visit:  Chronic low back pain  Lumbar spondylosis  Opiate use  Uncomplicated opioid dependence ([place])  Long term current use of opiate analgesic -     Drugs of  abuse screen w/o alc, rtn urine-sln; Standing  Encounter for therapeutic drug level monitoring  Other orders -     oxycodone-acetaminophen (PERCOCET) 2.5-325 MG tablet; Take 1 tablet by mouth every 12 (twelve) hours. -     oxycodone-acetaminophen (PERCOCET) 2.5-325 MG tablet; Take 1 tablet by mouth every 12 (twelve) hours as needed for pain. -     oxycodone-acetaminophen (PERCOCET) 2.5-325 MG tablet; Take 1 tablet by mouth every 12 (twelve) hours as needed for pain.     Patient Instructions  Smoking Cessation, Tips for Success If you are ready to quit smoking, congratulations! You have chosen to help yourself be healthier. Cigarettes bring nicotine, tar, carbon monoxide, and other irritants into your body. Your lungs, heart, and blood vessels will be able to work better without these poisons. There are many different ways to quit smoking. Nicotine gum, nicotine patches, a nicotine inhaler, or nicotine nasal spray can help with physical craving. Hypnosis, support groups, and medicines help break the habit of smoking. WHAT THINGS CAN I DO TO MAKE QUITTING EASIER?  Here are some tips to help you quit for good:  Pick a date when you will quit smoking completely. Tell all of your friends and family about your plan to quit on that date.  Do not try to slowly cut down on the number of cigarettes you are smoking. Pick a quit date and quit smoking completely starting on that day.  Throw away all cigarettes.   Clean and remove all ashtrays from your home, work, and car.  On a card, write down your reasons for quitting. Carry the card with you and read it when you get the urge to smoke.  Cleanse your body of nicotine. Drink enough water and fluids to keep your urine clear or pale yellow. Do this after quitting to flush  the nicotine from your body.  Learn to predict your moods. Do not let a bad situation be your excuse to have a cigarette. Some situations in your life might tempt you into wanting a  cigarette.  Never have "just one" cigarette. It leads to wanting another and another. Remind yourself of your decision to quit.  Change habits associated with smoking. If you smoked while driving or when feeling stressed, try other activities to replace smoking. Stand up when drinking your coffee. Brush your teeth after eating. Sit in a different chair when you read the paper. Avoid alcohol while trying to quit, and try to drink fewer caffeinated beverages. Alcohol and caffeine may urge you to smoke.  Avoid foods and drinks that can trigger a desire to smoke, such as sugary or spicy foods and alcohol.  Ask people who smoke not to smoke around you.  Have something planned to do right after eating or having a cup of coffee. For example, plan to take a walk or exercise.  Try a relaxation exercise to calm you down and decrease your stress. Remember, you may be tense and nervous for the first 2 weeks after you quit, but this will pass.  Find new activities to keep your hands busy. Play with a pen, coin, or rubber band. Doodle or draw things on paper.  Brush your teeth right after eating. This will help cut down on the craving for the taste of tobacco after meals. You can also try mouthwash.   Use oral substitutes in place of cigarettes. Try using lemon drops, carrots, cinnamon sticks, or chewing gum. Keep them handy so they are available when you have the urge to smoke.  When you have the urge to smoke, try deep breathing.  Designate your home as a nonsmoking area.  If you are a heavy smoker, ask your health care provider about a prescription for nicotine chewing gum. It can ease your withdrawal from nicotine.  Reward yourself. Set aside the cigarette money you save and buy yourself something nice.  Look for support from others. Join a support group or smoking cessation program. Ask someone at home or at work to help you with your plan to quit smoking.  Always ask yourself, "Do I need this  cigarette or is this just a reflex?" Tell yourself, "Today, I choose not to smoke," or "I do not want to smoke." You are reminding yourself of your decision to quit.  Do not replace cigarette smoking with electronic cigarettes (commonly called e-cigarettes). The safety of e-cigarettes is unknown, and some may contain harmful chemicals.  If you relapse, do not give up! Plan ahead and think about what you will do the next time you get the urge to smoke. HOW WILL I FEEL WHEN I QUIT SMOKING? You may have symptoms of withdrawal because your body is used to nicotine (the addictive substance in cigarettes). You may crave cigarettes, be irritable, feel very hungry, cough often, get headaches, or have difficulty concentrating. The withdrawal symptoms are only temporary. They are strongest when you first quit but will go away within 10-14 days. When withdrawal symptoms occur, stay in control. Think about your reasons for quitting. Remind yourself that these are signs that your body is healing and getting used to being without cigarettes. Remember that withdrawal symptoms are easier to treat than the major diseases that smoking can cause.  Even after the withdrawal is over, expect periodic urges to smoke. However, these cravings are generally short lived and will  go away whether you smoke or not. Do not smoke! WHAT RESOURCES ARE AVAILABLE TO HELP ME QUIT SMOKING? Your health care provider can direct you to community resources or hospitals for support, which may include:  Group support.  Education.  Hypnosis.  Therapy.   This information is not intended to replace advice given to you by your health care provider. Make sure you discuss any questions you have with your health care provider.   Document Released: 05/21/2004 Document Revised: 09/13/2014 Document Reviewed: 02/08/2013 Elsevier Interactive Patient Education 2016 [employer].    Medications discontinued today:  Medications Discontinued  During This Encounter  Medication Reason  . oxycodone-acetaminophen (PERCOCET) 2.5-325 MG per tablet Reorder   Medications administered today:  John [name] had no medications administered during this visit.  Primary Care Physician: Cletis Athens, MD Location: [facility] Note by: Kathlen Brunswick. Dossie Arbour, M.D, DABA, DABAPM, DABPM, DABIPP, FIPP

## 2015-07-14 ENCOUNTER — Other Ambulatory Visit: Payer: Self-pay | Admitting: Pain Medicine

## 2015-07-24 ENCOUNTER — Ambulatory Visit: Payer: Medicare PPO

## 2015-07-24 ENCOUNTER — Other Ambulatory Visit: Payer: Medicare PPO

## 2015-08-07 ENCOUNTER — Other Ambulatory Visit: Payer: Self-pay | Admitting: Hematology and Oncology

## 2015-08-07 DIAGNOSIS — D696 Thrombocytopenia, unspecified: Secondary | ICD-10-CM

## 2015-08-08 ENCOUNTER — Inpatient Hospital Stay (HOSPITAL_BASED_OUTPATIENT_CLINIC_OR_DEPARTMENT_OTHER): Payer: Medicare PPO | Admitting: Hematology and Oncology

## 2015-08-08 ENCOUNTER — Inpatient Hospital Stay: Payer: Medicare PPO | Attending: Hematology and Oncology

## 2015-08-08 VITALS — BP 137/75 | HR 69 | Temp 96.1°F | Resp 18 | Ht 72.0 in | Wt 210.5 lb

## 2015-08-08 DIAGNOSIS — F419 Anxiety disorder, unspecified: Secondary | ICD-10-CM | POA: Diagnosis not present

## 2015-08-08 DIAGNOSIS — D696 Thrombocytopenia, unspecified: Secondary | ICD-10-CM | POA: Diagnosis not present

## 2015-08-08 DIAGNOSIS — I10 Essential (primary) hypertension: Secondary | ICD-10-CM | POA: Diagnosis not present

## 2015-08-08 DIAGNOSIS — J449 Chronic obstructive pulmonary disease, unspecified: Secondary | ICD-10-CM | POA: Insufficient documentation

## 2015-08-08 DIAGNOSIS — M109 Gout, unspecified: Secondary | ICD-10-CM | POA: Diagnosis not present

## 2015-08-08 DIAGNOSIS — F329 Major depressive disorder, single episode, unspecified: Secondary | ICD-10-CM | POA: Insufficient documentation

## 2015-08-08 DIAGNOSIS — Z79899 Other long term (current) drug therapy: Secondary | ICD-10-CM | POA: Insufficient documentation

## 2015-08-08 DIAGNOSIS — F17210 Nicotine dependence, cigarettes, uncomplicated: Secondary | ICD-10-CM | POA: Diagnosis not present

## 2015-08-08 DIAGNOSIS — K7460 Unspecified cirrhosis of liver: Secondary | ICD-10-CM

## 2015-08-08 LAB — CBC WITH DIFFERENTIAL/PLATELET
Basophils Absolute: 0 10*3/uL (ref 0–0.1)
Basophils Relative: 0 %
Eosinophils Absolute: 0.1 10*3/uL (ref 0–0.7)
Eosinophils Relative: 3 %
HCT: 41.9 % (ref 40.0–52.0)
Hemoglobin: 14.3 g/dL (ref 13.0–18.0)
Lymphocytes Relative: 25 %
Lymphs Abs: 0.9 10*3/uL — ABNORMAL LOW (ref 1.0–3.6)
MCH: 32.8 pg (ref 26.0–34.0)
MCHC: 34.1 g/dL (ref 32.0–36.0)
MCV: 96.2 fL (ref 80.0–100.0)
Monocytes Absolute: 0.2 10*3/uL (ref 0.2–1.0)
Monocytes Relative: 7 %
Neutro Abs: 2.4 10*3/uL (ref 1.4–6.5)
Neutrophils Relative %: 65 %
Platelets: 72 10*3/uL — ABNORMAL LOW (ref 150–440)
RBC: 4.36 MIL/uL — ABNORMAL LOW (ref 4.40–5.90)
RDW: 14.1 % (ref 11.5–14.5)
WBC: 3.6 10*3/uL — ABNORMAL LOW (ref 3.8–10.6)

## 2015-08-08 LAB — COMPREHENSIVE METABOLIC PANEL
ALT: 18 U/L (ref 17–63)
AST: 23 U/L (ref 15–41)
Albumin: 4.5 g/dL (ref 3.5–5.0)
Alkaline Phosphatase: 73 U/L (ref 38–126)
Anion gap: 7 (ref 5–15)
BUN: 9 mg/dL (ref 6–20)
CO2: 32 mmol/L (ref 22–32)
Calcium: 9.5 mg/dL (ref 8.9–10.3)
Chloride: 102 mmol/L (ref 101–111)
Creatinine, Ser: 0.57 mg/dL — ABNORMAL LOW (ref 0.61–1.24)
GFR calc Af Amer: >60 mL/min (ref >60)
GFR calc non Af Amer: >60 mL/min (ref >60)
Glucose, Bld: 91 mg/dL (ref 65–99)
Potassium: 3.9 mmol/L (ref 3.5–5.1)
Sodium: 141 mmol/L (ref 135–145)
Total Bilirubin: 0.5 mg/dL (ref 0.3–1.2)
Total Protein: 7.9 g/dL (ref 6.5–8.1)

## 2015-08-08 LAB — APTT: aPTT: 33 seconds (ref 24–36)
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
## 2015-08-09 LAB — VITAMIN B12: Vitamin B-12: 594 pg/mL (ref 180–914)

## 2015-08-09 LAB — HEPATITIS B CORE ANTIBODY, TOTAL: Hep B Core Total Ab: POSITIVE — AB

## 2015-08-10 ENCOUNTER — Encounter: Payer: Self-pay | Admitting: Hematology and Oncology

## 2015-08-11 LAB — FOLATE: Folate: 68.8 ng/mL (ref >5.9)

## 2015-08-15 ENCOUNTER — Other Ambulatory Visit: Payer: Self-pay

## 2015-08-15 DIAGNOSIS — D696 Thrombocytopenia, unspecified: Secondary | ICD-10-CM

## 2015-09-22 ENCOUNTER — Encounter: Payer: Self-pay | Admitting: Pain Medicine

## 2015-09-22 ENCOUNTER — Ambulatory Visit: Payer: Medicare PPO | Attending: Pain Medicine | Admitting: Pain Medicine

## 2015-09-22 ENCOUNTER — Other Ambulatory Visit: Payer: Self-pay | Admitting: Pain Medicine

## 2015-09-22 VITALS — BP 137/59 | HR 57 | Temp 98.3°F | Resp 20 | Ht 72.0 in | Wt 205.0 lb

## 2015-09-22 DIAGNOSIS — G40909 Epilepsy, unspecified, not intractable, without status epilepticus: Secondary | ICD-10-CM | POA: Insufficient documentation

## 2015-09-22 DIAGNOSIS — Z79899 Other long term (current) drug therapy: Secondary | ICD-10-CM | POA: Diagnosis not present

## 2015-09-22 DIAGNOSIS — M542 Cervicalgia: Secondary | ICD-10-CM | POA: Diagnosis present

## 2015-09-22 DIAGNOSIS — G5600 Carpal tunnel syndrome, unspecified upper limb: Secondary | ICD-10-CM | POA: Diagnosis not present

## 2015-09-22 DIAGNOSIS — K219 Gastro-esophageal reflux disease without esophagitis: Secondary | ICD-10-CM | POA: Insufficient documentation

## 2015-09-22 DIAGNOSIS — J449 Chronic obstructive pulmonary disease, unspecified: Secondary | ICD-10-CM | POA: Insufficient documentation

## 2015-09-22 DIAGNOSIS — F1010 Alcohol abuse, uncomplicated: Secondary | ICD-10-CM | POA: Diagnosis not present

## 2015-09-22 DIAGNOSIS — M25512 Pain in left shoulder: Secondary | ICD-10-CM | POA: Diagnosis not present

## 2015-09-22 DIAGNOSIS — D72819 Decreased white blood cell count, unspecified: Secondary | ICD-10-CM | POA: Insufficient documentation

## 2015-09-22 DIAGNOSIS — G8929 Other chronic pain: Secondary | ICD-10-CM | POA: Insufficient documentation

## 2015-09-22 DIAGNOSIS — I10 Essential (primary) hypertension: Secondary | ICD-10-CM | POA: Insufficient documentation

## 2015-09-22 DIAGNOSIS — F17210 Nicotine dependence, cigarettes, uncomplicated: Secondary | ICD-10-CM | POA: Insufficient documentation

## 2015-09-22 DIAGNOSIS — F1190 Opioid use, unspecified, uncomplicated: Secondary | ICD-10-CM | POA: Insufficient documentation

## 2015-09-22 DIAGNOSIS — D696 Thrombocytopenia, unspecified: Secondary | ICD-10-CM | POA: Diagnosis not present

## 2015-09-22 DIAGNOSIS — M47816 Spondylosis without myelopathy or radiculopathy, lumbar region: Secondary | ICD-10-CM | POA: Diagnosis not present

## 2015-09-22 DIAGNOSIS — Z5181 Encounter for therapeutic drug level monitoring: Secondary | ICD-10-CM | POA: Diagnosis not present

## 2015-09-22 DIAGNOSIS — M25519 Pain in unspecified shoulder: Secondary | ICD-10-CM | POA: Diagnosis present

## 2015-09-22 DIAGNOSIS — G4733 Obstructive sleep apnea (adult) (pediatric): Secondary | ICD-10-CM | POA: Insufficient documentation

## 2015-09-22 DIAGNOSIS — M47812 Spondylosis without myelopathy or radiculopathy, cervical region: Secondary | ICD-10-CM | POA: Diagnosis not present

## 2015-09-22 DIAGNOSIS — B1920 Unspecified viral hepatitis C without hepatic coma: Secondary | ICD-10-CM | POA: Insufficient documentation

## 2015-09-22 DIAGNOSIS — M25511 Pain in right shoulder: Secondary | ICD-10-CM | POA: Insufficient documentation

## 2015-09-22 DIAGNOSIS — Z79891 Long term (current) use of opiate analgesic: Secondary | ICD-10-CM

## 2015-09-22 DIAGNOSIS — M5416 Radiculopathy, lumbar region: Secondary | ICD-10-CM | POA: Insufficient documentation

## 2015-09-22 DIAGNOSIS — M549 Dorsalgia, unspecified: Secondary | ICD-10-CM | POA: Diagnosis present

## 2015-09-22 DIAGNOSIS — K7460 Unspecified cirrhosis of liver: Secondary | ICD-10-CM | POA: Diagnosis not present

## 2015-09-22 DIAGNOSIS — M5412 Radiculopathy, cervical region: Secondary | ICD-10-CM | POA: Insufficient documentation

## 2015-09-22 DIAGNOSIS — Z7189 Other specified counseling: Secondary | ICD-10-CM

## 2015-09-22 LAB — SEDIMENTATION RATE: SED RATE: 15 mm/h (ref 0–20)

## 2015-09-22 LAB — C-REACTIVE PROTEIN: CRP: 0.5 mg/dL (ref <1.0)

## 2015-09-22 LAB — MAGNESIUM: Magnesium: 1.9 mg/dL (ref 1.7–2.4)

## 2015-09-22 MED ORDER — OXYCODONE-ACETAMINOPHEN 2.5-325 MG PO TABS: 1.0000 | ORAL_TABLET | Freq: Two times a day (BID) | ORAL | Status: DC | PRN

## 2015-09-22 MED ORDER — OXYCODONE-ACETAMINOPHEN 2.5-325 MG PO TABS: 1.0000 | ORAL_TABLET | Freq: Two times a day (BID) | ORAL | Status: DC

## 2015-09-22 NOTE — Patient Instructions (Signed)
Instructed to go get labwork drawn at Pre admit office.

## 2015-09-22 NOTE — Progress Notes (Signed)
Patient here today for medication refill.   Patient states that he went to [facility]/orthopedics re; L thumb pain.  States that when he is trying to pick something up, occasionally he drops it or if trying to remove a top or cap he has to switch.  States that they told him there was no cartilage between joints and would probably need an injection.  States that he saw Reche Dixon, [place]. Question;  Can the pain medication have an affect on his sleep, he is able to fall asleep and stay asleep x 2 hours and then awakens and has to sit up and then eventually falls back to sleep.   Does not have pill bottles for count.

## 2015-09-22 NOTE — Progress Notes (Signed)
Patient's Name: John [name] MRN: [id number] DOB: October 25, 1955 DOS: 09/22/2015  Primary Reason(s) for Visit: Encounter for Medication Management CC: Back Pain; Neck Pain; and Shoulder Pain   HPI:  Mr. Seefeldt is a 60 y.o. year old, male patient, who returns today as an established patient. He has Encounter for therapeutic drug level monitoring; Long term current use of opiate analgesic; Uncomplicated opioid dependence ([place]); Opiate use; Lumbar spondylosis; Chronic low back pain (Location of Primary Source of Pain) (Bilateral) (L>R); Allergy history, radiographic dye; Thrombocytopenia ([place]); AA (alcohol abuse); Carpal tunnel syndrome; Clinical depression; Difficulty in walking; Dystonia; Gastric ulcer; HCV (hepatitis C virus); H/O neoplasm; BP (high blood pressure); Decreased leukocytes; Hepatic cirrhosis ([place]); LBP (low back pain); Cervical pain; Loss of feeling or sensation; Absence of sensation; Current tobacco use; Has a tremor; Peripheral neuropathy ([place]); Osteoarthritis of spine with radiculopathy, lumbar region; Osteoarthritis of spine with radiculopathy, lumbosacral region; Alcoholic peripheral neuropathy ([place]); Chronic neck pain; Cervical spondylosis; Obstructive sleep apnea; History of panic attacks; GERD (gastroesophageal reflux disease); Chronic constipation; 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 His primarily concern today is the Back Pain; Neck Pain; and Shoulder Pain   The patient returns to the clinics today for pharmacological management of his chronic pain.  Reported Pain Score: 5 , clinically he  looks like a 2-3/10. Reported level is inconsistent with clinical obrservations. Pain Type: Chronic pain Pain Location: Back (neck and shoulders) Pain Orientation: Lower Pain Descriptors / Indicators: Aching, Sharp, Constant, Numbness Pain Frequency: Constant  Date of Last Visit: 06/22/16 Service Provided on Last Visit: Med Refill  Pharmacotherapy  Review:   Onset of action: Within expected pharmacological parameters Time to Peak effect: Timing and results are as within normal expected parameters Effectiveness: Described as relatively effective, allowing for increase in activities of daily living (ADL) % Relief: More than 50% Side-effects or Adverse reactions: None reported Duration of action: Within normal limits for medication [place] PMP: Compliant with practice rules and regulations UDS Results: Last UDS ordered on 06/23/2015 came back within normal limits with no unexpected results. UDS Interpretation: Patient appears to be compliant with practice rules and regulations Medication Assessment Form: Reviewed. Patient indicates being compliant with therapy Treatment compliance: Compliant Substance Use Disorder (SUD) Risk Level: Moderate, due to history of alcoholism. Pharmacologic Plan: Continue therapy as is  Lab Work: Illicit Drugs Lab Results  Component Value Date   THCU NEGATIVE 04/17/2012   PCPSCRNUR NEGATIVE 04/17/2012   MDMA NEGATIVE 04/17/2012   AMPHETMU NEGATIVE 04/17/2012   METHADONE NEGATIVE 04/17/2012   ETOH < 3 04/17/2012    Inflammation Markers No results found for: ESRSEDRATE, CRP  Renal Function Lab Results  Component Value Date   BUN 9 08/08/2015   CREATININE 0.57* 08/08/2015   GFRAA >60 08/08/2015   GFRNONAA >60 08/08/2015    Hepatic Function Lab Results  Component Value Date   AST 23 08/08/2015   ALT 18 08/08/2015   ALBUMIN 4.5 08/08/2015    Electrolytes Lab Results  Component Value Date   NA 141 08/08/2015   K 3.9 08/08/2015   CL 102  08/08/2015   CALCIUM 9.5 08/08/2015   MG 1.3* 04/18/2012    Allergies:  Mr. Holycross is allergic to  codeine sulfate; morphine; and duloxetine.  Meds:  The patient has a current medication list which includes the following prescription(s): baclofen, esomeprazole, folic acid, gabapentin, oxycodone-acetaminophen, ranitidine, and oxycodone-acetaminophen.  ROS:  Constitutional: Afebrile, no chills, well hydrated and well nourished Gastrointestinal: negative Musculoskeletal:negative Neurological: negative Behavioral/Psych: negative  PFSH:  Medical:  Mr. Sproule  has a past medical history of Anxiety; Depression; COPD (chronic obstructive pulmonary disease) ([place]); Hypertension; Hemorrhoids; History of GI bleed; Hepatitis B; Leukopenia; Gout; Pulse irregularity; Seizures ([place]); Dystonia; Spinal stenosis; Head injury (06/23/2015); Febrile seizures ([place]); Back ache (05/06/2014); Extremity pain (05/06/2014); and Foot pain (09/11/2014). Family: family history includes Cancer in his father; Cancer (age of onset: 14) in his brother; Heart disease in his father; Stroke in his father and mother. Surgical:  has past surgical history that includes surgery for cervical neck fracture and Esophagogastroduodenoscopy (11/2013). Tobacco:  reports that he has been smoking Cigarettes.  He has a 35 pack-year smoking history. He does not have any smokeless tobacco history on file. Alcohol:  reports that he does not drink alcohol. Drug:  has no drug history on file.  Physical Exam:  Vitals:  Today's Vitals   09/22/15 0951 09/22/15 0952  BP: 137/59   Pulse: 57   Temp: 98.3 F (36.8 C)   TempSrc: Oral   Resp: 20   Height: 6' (1.829 m)   Weight: 205 lb (92.987 kg)   SpO2: 100%   PainSc: 5  5     Calculated BMI: Body mass index is 27.8 kg/(m^2).  General appearance: alert, cooperative, appears stated age and no distress Eyes: PERLA Respiratory: No evidence respiratory distress, no audible rales or ronchi and  no use of accessory muscles of respiration  Cervical Spine Inspection: Normal anatomy Alignment: Symetrical Palpation: WNL ROM: Decreased  Upper Extremities Inspection: No gross anomalies detected ROM: Decreased range of motion of both shoulders. Sensory: Normal Motor: 5/5 Pulses: Palpable  Thoracic Spine Inspection: No gross anomalies detected Alignment: Symetrical Palpation: WNL ROM: Adequate  Lumbar Spine Inspection: No gross anomalies detected Alignment: Symetrical Palpation: WNL ROM: Adequate Provocative Tests: Lumbar Hyperextension and rotation test: deferred Patrick's Maneuver: deferred Gait: Antalgic (limping)  Lower Extremities Inspection: No gross anomalies detected ROM: Adequate Sensory: Normal Motor: 5/5  Assessment & Plan:  Primary Diagnosis & Pertinent Problem List: The primary encounter diagnosis was Chronic pain. Diagnoses of Long term current use of opiate analgesic, Encounter for therapeutic drug level monitoring, Long term prescription opiate use, Encounter for chronic pain management, and Radicular pain of shoulder (Bilateral) (R>L) were also pertinent to this visit.  Assessment: No problem-specific assessment & plan notes found for this encounter.   Pharmacotherapy Orders: Meds ordered this encounter  Medications  . oxycodone-acetaminophen (PERCOCET) 2.5-325 MG tablet    Sig: Take 1 tablet by mouth every 12 (twelve) hours.    Dispense:  60 tablet    Refill:  0    Do not place this medication, or any other prescription from our practice, on "Automatic Refill". Patient may have prescription filled one day early if pharmacy is closed on scheduled refill date. Do not fill until: 09/22/15 To last until: 10/22/15  . oxycodone-acetaminophen (PERCOCET) 2.5-325 MG tablet    Sig: Take 1 tablet by mouth every 12 (twelve) hours as needed for pain.    Dispense:  60 tablet    Refill:  0    Do not place this medication, or any other prescription from  our practice, on "Automatic Refill". Patient may have prescription filled one day  early if pharmacy is closed on scheduled refill date. Do not fill until: 10/22/15 To last until: 11/18/15    [facility] & Procedure Orders: Orders Placed This Encounter  Procedures  . Drugs of abuse screen w/o alc, rtn urine-sln  . C-reactive protein  . Magnesium  . Vitamin D pnl(25-hydrxy+1,25-dihy)-bld  . Sedimentation rate    Radiology Orders: None  Interventional Therapies: None for the time being.    Administered Medications: Mr. Ulbrich had no medications administered during this visit.  Primary Care Physician: Cletis Athens, MD Location: [facility] Facility Note by: Kathlen Brunswick. Dossie Arbour, M.D, DABA, DABAPM, DABPM, DABIPP, FIPP

## 2015-09-27 LAB — TOXASSURE SELECT 13 (MW), URINE: PDF: 0

## 2015-09-30 ENCOUNTER — Telehealth: Payer: Self-pay | Admitting: *Deleted

## 2015-09-30 NOTE — Telephone Encounter (Signed)
Left voicemail for patient notifyng them that it is time to schedule annual low dose lung cancer screening CT scan. Instructed patient to call back to verify information prior to the scan being scheduled.  

## 2015-11-06 ENCOUNTER — Inpatient Hospital Stay (HOSPITAL_BASED_OUTPATIENT_CLINIC_OR_DEPARTMENT_OTHER): Payer: Medicare PPO | Admitting: Hematology and Oncology

## 2015-11-06 ENCOUNTER — Inpatient Hospital Stay: Payer: Medicare PPO | Attending: Hematology and Oncology

## 2015-11-06 VITALS — BP 119/64 | HR 58 | Temp 97.6°F | Resp 18 | Ht 72.0 in | Wt 209.0 lb

## 2015-11-06 DIAGNOSIS — F419 Anxiety disorder, unspecified: Secondary | ICD-10-CM | POA: Diagnosis not present

## 2015-11-06 DIAGNOSIS — I10 Essential (primary) hypertension: Secondary | ICD-10-CM | POA: Insufficient documentation

## 2015-11-06 DIAGNOSIS — M109 Gout, unspecified: Secondary | ICD-10-CM | POA: Diagnosis not present

## 2015-11-06 DIAGNOSIS — D696 Thrombocytopenia, unspecified: Secondary | ICD-10-CM

## 2015-11-06 DIAGNOSIS — K7460 Unspecified cirrhosis of liver: Secondary | ICD-10-CM

## 2015-11-06 DIAGNOSIS — F329 Major depressive disorder, single episode, unspecified: Secondary | ICD-10-CM | POA: Diagnosis not present

## 2015-11-06 DIAGNOSIS — F1010 Alcohol abuse, uncomplicated: Secondary | ICD-10-CM | POA: Insufficient documentation

## 2015-11-06 DIAGNOSIS — Z79899 Other long term (current) drug therapy: Secondary | ICD-10-CM

## 2015-11-06 DIAGNOSIS — F17210 Nicotine dependence, cigarettes, uncomplicated: Secondary | ICD-10-CM | POA: Diagnosis not present

## 2015-11-06 DIAGNOSIS — J449 Chronic obstructive pulmonary disease, unspecified: Secondary | ICD-10-CM | POA: Insufficient documentation

## 2015-11-06 LAB — CBC WITH DIFFERENTIAL/PLATELET
Basophils Absolute: 0 10*3/uL (ref 0–0.1)
Basophils Relative: 0 %
Eosinophils Absolute: 0.1 10*3/uL (ref 0–0.7)
Eosinophils Relative: 3 %
HCT: 41.2 % (ref 40.0–52.0)
Hemoglobin: 14.4 g/dL (ref 13.0–18.0)
Lymphocytes Relative: 23 %
Lymphs Abs: 0.8 10*3/uL — ABNORMAL LOW (ref 1.0–3.6)
MCH: 33.4 pg (ref 26.0–34.0)
MCHC: 34.9 g/dL (ref 32.0–36.0)
MCV: 95.8 fL (ref 80.0–100.0)
Monocytes Absolute: 0.3 10*3/uL (ref 0.2–1.0)
Monocytes Relative: 9 %
Neutro Abs: 2.3 10*3/uL (ref 1.4–6.5)
Neutrophils Relative %: 65 %
Platelets: 65 10*3/uL — ABNORMAL LOW (ref 150–440)
RBC: 4.3 MIL/uL — ABNORMAL LOW (ref 4.40–5.90)
RDW: 14.6 % — ABNORMAL HIGH (ref 11.5–14.5)
WBC: 3.6 10*3/uL — ABNORMAL LOW (ref 3.8–10.6)
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
## 2015-11-06 NOTE — Progress Notes (Signed)
Patient states that he has some pain in his upper chest since Monday. He was trimming some bushes for his mother and thinks that he may have pulled a muscle. His dentist wants to pull a tooth, but he is concerned because of his plt. Issues.

## 2015-11-07 LAB — AFP TUMOR MARKER: AFP-Tumor Marker: 3.6 ng/mL (ref 0.0–8.3)

## 2015-11-07 LAB — HEPATITIS C ANTIBODY: HCV Ab: <0.1 s/co ratio (ref 0.0–0.9)

## 2015-11-13 ENCOUNTER — Ambulatory Visit
Admission: RE | Admit: 2015-11-13 | Discharge: 2015-11-13 | Disposition: A | Discharge status: Home / Self Care | Payer: Medicare PPO | Source: Ambulatory Visit | Attending: Hematology and Oncology | Admitting: Hematology and Oncology

## 2015-11-13 DIAGNOSIS — K8020 Calculus of gallbladder without cholecystitis without obstruction: Secondary | ICD-10-CM | POA: Diagnosis not present

## 2015-11-13 DIAGNOSIS — K7460 Unspecified cirrhosis of liver: Secondary | ICD-10-CM | POA: Diagnosis present

## 2015-11-13 DIAGNOSIS — D696 Thrombocytopenia, unspecified: Secondary | ICD-10-CM | POA: Diagnosis not present

## 2015-11-13 DIAGNOSIS — K7689 Other specified diseases of liver: Secondary | ICD-10-CM | POA: Insufficient documentation

## 2015-11-16 ENCOUNTER — Encounter: Payer: Self-pay | Admitting: Hematology and Oncology

## 2015-11-18 ENCOUNTER — Ambulatory Visit: Payer: Medicare PPO | Attending: Pain Medicine | Admitting: Pain Medicine

## 2015-11-18 ENCOUNTER — Other Ambulatory Visit: Payer: Self-pay | Admitting: Pain Medicine

## 2015-11-18 ENCOUNTER — Encounter: Payer: Self-pay | Admitting: Pain Medicine

## 2015-11-18 VITALS — BP 105/43 | HR 67 | Temp 98.5°F | Resp 16 | Ht 72.0 in | Wt 205.0 lb

## 2015-11-18 DIAGNOSIS — F329 Major depressive disorder, single episode, unspecified: Secondary | ICD-10-CM | POA: Diagnosis not present

## 2015-11-18 DIAGNOSIS — D696 Thrombocytopenia, unspecified: Secondary | ICD-10-CM | POA: Insufficient documentation

## 2015-11-18 DIAGNOSIS — M47896 Other spondylosis, lumbar region: Secondary | ICD-10-CM | POA: Diagnosis not present

## 2015-11-18 DIAGNOSIS — F1010 Alcohol abuse, uncomplicated: Secondary | ICD-10-CM | POA: Insufficient documentation

## 2015-11-18 DIAGNOSIS — Z79891 Long term (current) use of opiate analgesic: Secondary | ICD-10-CM | POA: Insufficient documentation

## 2015-11-18 DIAGNOSIS — F17200 Nicotine dependence, unspecified, uncomplicated: Secondary | ICD-10-CM | POA: Diagnosis not present

## 2015-11-18 DIAGNOSIS — B1920 Unspecified viral hepatitis C without hepatic coma: Secondary | ICD-10-CM | POA: Insufficient documentation

## 2015-11-18 DIAGNOSIS — F1190 Opioid use, unspecified, uncomplicated: Secondary | ICD-10-CM

## 2015-11-18 DIAGNOSIS — F1120 Opioid dependence, uncomplicated: Secondary | ICD-10-CM | POA: Insufficient documentation

## 2015-11-18 DIAGNOSIS — I10 Essential (primary) hypertension: Secondary | ICD-10-CM | POA: Insufficient documentation

## 2015-11-18 DIAGNOSIS — M79601 Pain in right arm: Secondary | ICD-10-CM | POA: Diagnosis not present

## 2015-11-18 DIAGNOSIS — M545 Low back pain: Secondary | ICD-10-CM | POA: Insufficient documentation

## 2015-11-18 DIAGNOSIS — G4733 Obstructive sleep apnea (adult) (pediatric): Secondary | ICD-10-CM | POA: Diagnosis not present

## 2015-11-18 DIAGNOSIS — G629 Polyneuropathy, unspecified: Secondary | ICD-10-CM | POA: Diagnosis not present

## 2015-11-18 DIAGNOSIS — D72819 Decreased white blood cell count, unspecified: Secondary | ICD-10-CM | POA: Diagnosis not present

## 2015-11-18 DIAGNOSIS — M4727 Other spondylosis with radiculopathy, lumbosacral region: Secondary | ICD-10-CM | POA: Diagnosis not present

## 2015-11-18 DIAGNOSIS — M549 Dorsalgia, unspecified: Secondary | ICD-10-CM | POA: Diagnosis present

## 2015-11-18 DIAGNOSIS — Z5181 Encounter for therapeutic drug level monitoring: Secondary | ICD-10-CM | POA: Diagnosis not present

## 2015-11-18 DIAGNOSIS — K219 Gastro-esophageal reflux disease without esophagitis: Secondary | ICD-10-CM | POA: Insufficient documentation

## 2015-11-18 DIAGNOSIS — J449 Chronic obstructive pulmonary disease, unspecified: Secondary | ICD-10-CM | POA: Insufficient documentation

## 2015-11-18 DIAGNOSIS — G621 Alcoholic polyneuropathy: Secondary | ICD-10-CM | POA: Diagnosis not present

## 2015-11-18 DIAGNOSIS — G5600 Carpal tunnel syndrome, unspecified upper limb: Secondary | ICD-10-CM | POA: Diagnosis not present

## 2015-11-18 DIAGNOSIS — G40909 Epilepsy, unspecified, not intractable, without status epilepticus: Secondary | ICD-10-CM | POA: Diagnosis not present

## 2015-11-18 DIAGNOSIS — K7460 Unspecified cirrhosis of liver: Secondary | ICD-10-CM | POA: Insufficient documentation

## 2015-11-18 DIAGNOSIS — M542 Cervicalgia: Secondary | ICD-10-CM | POA: Insufficient documentation

## 2015-11-18 DIAGNOSIS — G8929 Other chronic pain: Secondary | ICD-10-CM | POA: Insufficient documentation

## 2015-11-18 DIAGNOSIS — T402X5A Adverse effect of other opioids, initial encounter: Secondary | ICD-10-CM | POA: Diagnosis not present

## 2015-11-18 DIAGNOSIS — K5903 Drug induced constipation: Secondary | ICD-10-CM | POA: Diagnosis not present

## 2015-11-18 MED ORDER — GABAPENTIN 600 MG PO TABS: 600.0000 mg | ORAL_TABLET | Freq: Three times a day (TID) | ORAL | Status: DC

## 2015-11-18 MED ORDER — OXYCODONE-ACETAMINOPHEN 2.5-325 MG PO TABS: 1.0000 | ORAL_TABLET | Freq: Two times a day (BID) | ORAL | Status: DC

## 2015-11-18 MED ORDER — OXYCODONE-ACETAMINOPHEN 2.5-325 MG PO TABS: 1.0000 | ORAL_TABLET | Freq: Two times a day (BID) | ORAL | Status: DC | PRN

## 2015-11-18 MED ORDER — BACLOFEN 10 MG PO TABS: 10.0000 mg | ORAL_TABLET | Freq: Three times a day (TID) | ORAL | Status: DC

## 2015-11-18 MED ORDER — LUBIPROSTONE 24 MCG PO CAPS
24.0000 ug | ORAL_CAPSULE | Freq: Two times a day (BID) | ORAL | Status: DC
Start: 2015-11-18 — End: 2016-01-21

## 2015-11-18 NOTE — Assessment & Plan Note (Signed)
Amitiza trial today (11/18/2015) [place] (Dr. Beatriz Chancellor A. Dossie Arbour)

## 2015-11-18 NOTE — Patient Instructions (Signed)
Smoking Cessation, Tips for Success If you are ready to quit smoking, congratulations! You have chosen to help yourself be healthier. Cigarettes bring nicotine, tar, carbon monoxide, and other irritants into your body. Your lungs, heart, and blood vessels will be able to work better without these poisons. There are many different ways to quit smoking. Nicotine gum, nicotine patches, a nicotine inhaler, or nicotine nasal spray can help with physical craving. Hypnosis, support groups, and medicines help break the habit of smoking. WHAT THINGS CAN I DO TO MAKE QUITTING EASIER?  Here are some tips to help you quit for good:  Pick a date when you will quit smoking completely. Tell all of your friends and family about your plan to quit on that date.  Do not try to slowly cut down on the number of cigarettes you are smoking. Pick a quit date and quit smoking completely starting on that day.  Throw away all cigarettes.   Clean and remove all ashtrays from your home, work, and car.  On a card, write down your reasons for quitting. Carry the card with you and read it when you get the urge to smoke.  Cleanse your body of nicotine. Drink enough water and fluids to keep your urine clear or pale yellow. Do this after quitting to flush the nicotine from your body.  Learn to predict your moods. Do not let a bad situation be your excuse to have a cigarette. Some situations in your life might tempt you into wanting a cigarette.  Never have "just one" cigarette. It leads to wanting another and another. Remind yourself of your decision to quit.  Change habits associated with smoking. If you smoked while driving or when feeling stressed, try other activities to replace smoking. Stand up when drinking your coffee. Brush your teeth after eating. Sit in a different chair when you read the paper. Avoid alcohol while trying to quit, and try to drink fewer caffeinated beverages. Alcohol and caffeine may urge you to  smoke.  Avoid foods and drinks that can trigger a desire to smoke, such as sugary or spicy foods and alcohol.  Ask people who smoke not to smoke around you.  Have something planned to do right after eating or having a cup of coffee. For example, plan to take a walk or exercise.  Try a relaxation exercise to calm you down and decrease your stress. Remember, you may be tense and nervous for the first 2 weeks after you quit, but this will pass.  Find new activities to keep your hands busy. Play with a pen, coin, or rubber band. Doodle or draw things on paper.  Brush your teeth right after eating. This will help cut down on the craving for the taste of tobacco after meals. You can also try mouthwash.   Use oral substitutes in place of cigarettes. Try using lemon drops, carrots, cinnamon sticks, or chewing gum. Keep them handy so they are available when you have the urge to smoke.  When you have the urge to smoke, try deep breathing.  Designate your home as a nonsmoking area.  If you are a heavy smoker, ask your health care provider about a prescription for nicotine chewing gum. It can ease your withdrawal from nicotine.  Reward yourself. Set aside the cigarette money you save and buy yourself something nice.  Look for support from others. Join a support group or smoking cessation program. Ask someone at home or at work to help you with your plan   to quit smoking.  Always ask yourself, "Do I need this cigarette or is this just a reflex?" Tell yourself, "Today, I choose not to smoke," or "I do not want to smoke." You are reminding yourself of your decision to quit.  Do not replace cigarette smoking with electronic cigarettes (commonly called e-cigarettes). The safety of e-cigarettes is unknown, and some may contain harmful chemicals.  If you relapse, do not give up! Plan ahead and think about what you will do the next time you get the urge to smoke. HOW WILL I FEEL WHEN I QUIT SMOKING? You  may have symptoms of withdrawal because your body is used to nicotine (the addictive substance in cigarettes). You may crave cigarettes, be irritable, feel very hungry, cough often, get headaches, or have difficulty concentrating. The withdrawal symptoms are only temporary. They are strongest when you first quit but will go away within 10-14 days. When withdrawal symptoms occur, stay in control. Think about your reasons for quitting. Remind yourself that these are signs that your body is healing and getting used to being without cigarettes. Remember that withdrawal symptoms are easier to treat than the major diseases that smoking can cause.  Even after the withdrawal is over, expect periodic urges to smoke. However, these cravings are generally short lived and will go away whether you smoke or not. Do not smoke! WHAT RESOURCES ARE AVAILABLE TO HELP ME QUIT SMOKING? Your health care provider can direct you to community resources or hospitals for support, which may include:  Group support.  Education.  Hypnosis.  Therapy.   This information is not intended to replace advice given to you by your health care provider. Make sure you discuss any questions you have with your health care provider.   Document Released: 05/21/2004 Document Revised: 09/13/2014 Document Reviewed: 02/08/2013 Elsevier Interactive Patient Education 2016 [employer]  

## 2015-11-18 NOTE — Progress Notes (Signed)
Patient's Name: John [name] MRN: [id number] DOB: 08/08/1956 DOS: 11/18/2015  Primary Reason(s) for Visit: Encounter for Medication Management CC: Back Pain   HPI  John [name] is a 60 y.o. year old, male patient, who returns today as an established patient. He has Encounter for therapeutic drug level monitoring; Long term current use of opiate analgesic; Uncomplicated opioid dependence ([place]); Opiate use (7.5 MME/Day); Lumbar spondylosis; Chronic low back pain (Location of Primary Source of Pain) (Bilateral) (L>R); Allergy history, radiographic dye; Thrombocytopenia ([place]); AA (alcohol abuse); Carpal tunnel syndrome; Clinical depression; Difficulty in walking; Dystonia; Gastric ulcer; HCV (hepatitis C virus); H/O neoplasm; BP (high blood pressure); Decreased leukocytes; Hepatic cirrhosis ([place]); LBP (low back pain); Cervical pain; Loss of feeling or sensation; Absence of sensation; Current tobacco use; Has a tremor; Peripheral neuropathy ([place]); Osteoarthritis of spine with radiculopathy, lumbar region; Osteoarthritis of spine with radiculopathy, lumbosacral region; Alcoholic peripheral neuropathy ([place]); Chronic neck pain; Cervical spondylosis; Obstructive sleep apnea; History of panic attacks; GERD (gastroesophageal reflux disease); Chronic constipation; 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 His primarily concern today is the Back Pain   The patient returns to the clinics today for pharmacological management of his chronic pain. Unfortunately, he continues to smoke and today I have  talked to him about this and how the nicotine will speed up the metabolism of his pain medicine thereby making them last less time. He is still being followed at the cancer center and they are doing most of his lab work. He is having some opioid-induced constipation and therefore today we will do a trial of Amitiza.  Pain Assessment: Self-Reported Pain Score: 5  Reported level is compatible with observation Pain Type: Chronic pain Pain Location: Back Pain Orientation: Lower Pain Descriptors / Indicators: Aching, Sharp, Constant, Numbness Pain Frequency: Constant  Date of Last Visit: 09/22/15 Service Provided on Last Visit: Med Refill  Controlled Substance Pharmacotherapy Assessment  Analgesic: Oxycodone/APAP 2.5/325 one every 12 hours when necessary (5 mg/day) MME/day: 7.5 mg/day Pharmacokinetics: Onset of action (Liberation/Absorption): Within expected pharmacological parameters Time to Peak effect (Distribution): Timing and results are as within normal expected parameters Duration of action (Metabolism/Excretion): Within normal limits for medication Pharmacodynamics: Analgesic Effect: More than 50% Activity Facilitation: Medication(s) allow patient to sit, stand, walk, and do the basic ADLs Perceived Effectiveness: Described as relatively effective, allowing for increase in activities of daily living (ADL) Side-effects or Adverse reactions: Opioid-induced constipation, currently taking over-the-counter medications with little success. Monitoring: [place] PMP: Compliant with practice rules and regulations UDS Results/interpretation: Last UDS done on 09/22/2015 came back within normal limits with no unexpected results however, the urinary creatinine was low and the ability to detect some drugs may be compromised. Medication Assessment Form: Reviewed. Patient indicates being compliant with therapy Treatment compliance: Compliant Risk Assessment: Aberrant Behavior: None observed  today Substance Use Disorder (SUD) Risk Level: Low Opioid Risk Tool (ORT) Score: Total Score: 0 Low Risk for SUD (Score <3) Depression Scale Score: PHQ-2: PHQ-2 Total Score: 0 No depression (0) PHQ-9: PHQ-9 Total Score: 0 No depression (0-4)  Pharmacologic Plan: No change in therapy, at this time   Laboratory Workup  Last ED UDS: Lab Results  Component Value Date  THCU NEGATIVE 04/17/2012   PCPSCRNUR NEGATIVE 04/17/2012   MDMA NEGATIVE 04/17/2012   AMPHETMU NEGATIVE 04/17/2012   METHADONE NEGATIVE 04/17/2012   ETOH < 3 04/17/2012    Inflammation Markers Lab Results  Component Value Date   ESRSEDRATE 15 09/22/2015   CRP 0.5 09/22/2015    Renal Function Lab Results  Component Value Date   BUN 9 08/08/2015   CREATININE 0.57* 08/08/2015   GFRAA >60 08/08/2015   GFRNONAA >60 08/08/2015    Hepatic Function Lab Results  Component Value Date   AST 23 08/08/2015   ALT 18 08/08/2015   ALBUMIN 4.5 08/08/2015    Electrolytes Lab Results  Component Value Date   NA 141 08/08/2015   K 3.9 08/08/2015   CL 102 08/08/2015   CALCIUM 9.5 08/08/2015   MG 1.9 09/22/2015    Allergies  John [name] is allergic to codeine sulfate; morphine; and duloxetine.  Meds  The patient has a current medication list which includes the following prescription(s): baclofen, esomeprazole, folic acid, gabapentin, oxycodone-acetaminophen, ranitidine, lubiprostone, and oxycodone-acetaminophen.  Current Outpatient Prescriptions on File Prior to Visit  Medication Sig  . esomeprazole (NEXIUM) 20 MG capsule Take 20 mg by mouth daily at 12 noon.  . folic acid (FOLVITE) 1 MG tablet Take 1 mg by mouth.  . ranitidine (ZANTAC) 75 MG tablet Take 75 mg by mouth daily.   No current facility-administered medications on file prior to visit.    ROS  Constitutional: Afebrile, no chills, well hydrated and well nourished Gastrointestinal: negative Musculoskeletal:negative Neurological:  negative Behavioral/Psych: negative  PFSH  Medical:  John [name]  has a past medical history of Anxiety; Depression; COPD (chronic obstructive pulmonary disease) ([place]); Hypertension; Hemorrhoids; History of GI bleed; Hepatitis B; Leukopenia; Gout; Pulse irregularity; Seizures ([place]); Dystonia; Spinal stenosis; Head injury (06/23/2015); Febrile seizures ([place]); Back ache (05/06/2014); Extremity pain (05/06/2014); and Foot pain (09/11/2014). Family: family history includes Cancer in his father; Cancer (age of onset: 72) in his brother; Heart disease in his father; Stroke in his father and mother. Surgical:  has past surgical history that includes surgery for cervical neck fracture and Esophagogastroduodenoscopy (11/2013). Tobacco:  reports that he has been smoking Cigarettes.  He has a 35 pack-year smoking history. He does not have any smokeless tobacco history on file. Alcohol:  reports that he does not drink alcohol. Drug:  reports that he does not use illicit drugs.  Physical Exam  Vitals:  Today's Vitals   11/18/15 0946 11/18/15 0948  BP:  105/43  Pulse: 67   Temp: 98.5 F (36.9 C)   Resp: 16   Height: 6' (1.829 m)   Weight: 205 lb (92.987 kg)   SpO2: 98%   PainSc: 5  5   PainLoc: Back     Calculated BMI: Body mass index is 27.8 kg/(m^2).     General appearance: alert, cooperative, appears stated age, no distress and he still has his tremor. Eyes: PERLA Respiratory: No evidence respiratory distress, no audible rales or ronchi and no use of accessory muscles of respiration  Cervical Spine Inspection: Normal anatomy Alignment: Symetrical ROM: Adequate  Upper Extremities Inspection: No gross anomalies detected ROM: Adequate Sensory: Normal Motor: Unremarkable  Thoracic Spine Inspection: No gross anomalies detected Alignment: Symetrical ROM: Adequate  Lumbar Spine Inspection: No gross anomalies detected Alignment: Symetrical ROM: Decreased  Gait: WNL  Lower  Extremities Inspection: No gross anomalies detected ROM: Adequate Sensory:  Normal Motor: Unremarkable  Assessment & Plan  Primary Diagnosis & Pertinent Problem  List: The primary encounter diagnosis was Chronic pain. Diagnoses of Chronic low back pain (Location of Primary Source of Pain) (Bilateral) (L>R), Encounter for therapeutic drug level monitoring, Long term current use of opiate analgesic, Opioid-induced constipation (OIC), and Opiate use (7.5 MME/Day) were also pertinent to this visit.  Visit Diagnosis: 1. Chronic pain   2. Chronic low back pain (Location of Primary Source of Pain) (Bilateral) (L>R)   3. Encounter for therapeutic drug level monitoring   4. Long term current use of opiate analgesic   5. Opioid-induced constipation (OIC)   6. Opiate use (7.5 MME/Day)     Problem-specific Plan(s): Opioid-induced constipation (OIC) Amitiza trial today (11/18/2015) [place] (Dr. Beatriz Chancellor A. Serrita Lueth)    Plan of Care  Pharmacotherapy (Medications Ordered): Meds ordered this encounter  Medications  . oxycodone-acetaminophen (PERCOCET) 2.5-325 MG tablet    Sig: Take 1 tablet by mouth every 12 (twelve) hours.    Dispense:  60 tablet    Refill:  0    Do not place this medication, or any other prescription from our practice, on "Automatic Refill". Patient may have prescription filled one day early if pharmacy is closed on scheduled refill date. Do not fill until: 11/18/15 To last until: 12/18/15  . baclofen (LIORESAL) 10 MG tablet    Sig: Take 1 tablet (10 mg total) by mouth 3 (three) times daily.    Dispense:  90 tablet    Refill:  1    Do not place this medication, or any other prescription from our practice, on "Automatic Refill". Patient may have prescription filled one day early if pharmacy is closed on scheduled refill date.  . gabapentin (NEURONTIN) 600 MG tablet    Sig: Take 1 tablet (600 mg total) by mouth 3 (three) times daily.     Dispense:  90 tablet    Refill:  1    Do not place this medication, or any other prescription from our practice, on "Automatic Refill". Patient may have prescription filled one day early if pharmacy is closed on scheduled refill date.  Marland Kitchen oxycodone-acetaminophen (PERCOCET) 2.5-325 MG tablet    Sig: Take 1 tablet by mouth every 12 (twelve) hours as needed for pain.    Dispense:  60 tablet    Refill:  0    Do not place this medication, or any other prescription from our practice, on "Automatic Refill". Patient may have prescription filled one day early if pharmacy is closed on scheduled refill date. Do not fill until: 12/18/15 To last until: 01/17/16  . lubiprostone (AMITIZA) 24 MCG capsule    Sig: Take 1 capsule (24 mcg total) by mouth 2 (two) times daily with a meal. Swallow the medication whole. Do not break or chew the medication.    Dispense:  60 capsule    Refill:  PRN    Do not place this medication, or any other prescription from our practice, on "Automatic Refill". Patient may have prescription filled one day early if pharmacy is closed on scheduled refill date.    Lab-work & Procedure Ordered: Orders Placed This Encounter  Procedures  . ToxASSURE Select 13 (MW), Urine    Volume: 30 ml(s). Minimum 3 ml of urine is needed. Document temperature of fresh sample. Indications: Long term (current) use of opiate analgesic (Z48.270)    Imaging Ordered: None  Interventional Therapies: Scheduled: None at this time. PRN Procedures: None at this time.    Referral(s) or Consult(s): None at this time.  Medications administered during this visit: Mr. Gendreau had no medications administered during this visit.  Future Appointments Date Time Provider [place]  02/13/2016 10:30 AM [facility] None  02/13/2016 11:00 AM Lequita Asal, MD [facility] None    Primary Care Physician: Cletis [place], MD Location: [facility] Facility Note by:  Kathlen [place]. Dossie Arbour, M.D, DABA, DABAPM, DABPM, DABIPP, FIPP  Pain Score Disclaimer: We use the NRS-11 scale. This is a self-reported, subjective measurement of pain severity with only modest accuracy. It is used primarily to identify changes within a particular patient. It must be understood that outpatient pain scales are significantly less accurate that those used for research, where they can be applied under ideal controlled circumstances with minimal exposure to variables. In reality, the score is likely to be a combination of pain intensity and pain affect, where pain affect describes the degree of emotional arousal or changes in action readiness caused by the sensory experience of pain. Factors such as social and work situation, setting, emotional state, anxiety levels, expectation, and prior pain experience may influence pain perception and show large inter-individual differences that may also be affected by time variables.

## 2015-11-18 NOTE — Progress Notes (Signed)
Safety precautions to be maintained throughout the outpatient stay will include: orient to surroundings, keep bed in low position, maintain call bell within reach at all times, provide assistance with transfer out of bed and ambulation. Percocet # 13.5 /60  Filled 10-24-15

## 2015-11-22 LAB — TOXASSURE SELECT 13 (MW), URINE: PDF: 0

## 2015-12-29 ENCOUNTER — Telehealth: Payer: Self-pay | Admitting: *Deleted

## 2015-12-29 NOTE — Telephone Encounter (Signed)
Notified patient that 1 year follow up lung cancer screening low dose CT scan is due. Confirmed that patient is within age range of 33-77 (60 yo) , asymptomatic of lung cancer, and no other serious disease processes that would make treatment of lung cancer not possible. The patient is a  Current smoker with a 44 pack/ year history. The shared decision making visit was completed 08/07/14. The patient is agreeable for CT scan to be scheduled.

## 2015-12-31 ENCOUNTER — Other Ambulatory Visit: Payer: Self-pay | Admitting: Pain Medicine

## 2016-01-09 ENCOUNTER — Other Ambulatory Visit: Payer: Self-pay | Admitting: Family Medicine

## 2016-01-09 ENCOUNTER — Encounter: Payer: Self-pay | Admitting: Family Medicine

## 2016-01-09 DIAGNOSIS — Z87891 Personal history of nicotine dependence: Secondary | ICD-10-CM | POA: Insufficient documentation

## 2016-01-09 HISTORY — DX: Personal history of nicotine dependence: Z87.891

## 2016-01-14 ENCOUNTER — Encounter: Payer: Self-pay | Admitting: Pain Medicine

## 2016-01-20 ENCOUNTER — Ambulatory Visit
Admission: RE | Admit: 2016-01-20 | Discharge: 2016-01-20 | Disposition: A | Discharge status: Home / Self Care | Payer: Medicare PPO | Source: Ambulatory Visit | Attending: Family Medicine | Admitting: Family Medicine

## 2016-01-20 DIAGNOSIS — I2510 Atherosclerotic heart disease of native coronary artery without angina pectoris: Secondary | ICD-10-CM | POA: Diagnosis not present

## 2016-01-20 DIAGNOSIS — K8020 Calculus of gallbladder without cholecystitis without obstruction: Secondary | ICD-10-CM | POA: Diagnosis not present

## 2016-01-20 DIAGNOSIS — I700 Atherosclerosis of aorta: Secondary | ICD-10-CM | POA: Insufficient documentation

## 2016-01-20 DIAGNOSIS — Z87891 Personal history of nicotine dependence: Secondary | ICD-10-CM | POA: Insufficient documentation

## 2016-01-20 DIAGNOSIS — R932 Abnormal findings on diagnostic imaging of liver and biliary tract: Secondary | ICD-10-CM | POA: Diagnosis not present

## 2016-01-21 ENCOUNTER — Encounter: Payer: Self-pay | Admitting: Pain Medicine

## 2016-01-21 ENCOUNTER — Ambulatory Visit: Payer: Medicare PPO | Attending: Pain Medicine | Admitting: Pain Medicine

## 2016-01-21 VITALS — BP 114/54 | Temp 98.5°F | Ht 72.0 in | Wt 204.0 lb

## 2016-01-21 DIAGNOSIS — M4806 Spinal stenosis, lumbar region: Secondary | ICD-10-CM

## 2016-01-21 DIAGNOSIS — M4802 Spinal stenosis, cervical region: Secondary | ICD-10-CM

## 2016-01-21 DIAGNOSIS — M791 Myalgia: Secondary | ICD-10-CM

## 2016-01-21 DIAGNOSIS — M5416 Radiculopathy, lumbar region: Secondary | ICD-10-CM

## 2016-01-21 DIAGNOSIS — Z5181 Encounter for therapeutic drug level monitoring: Secondary | ICD-10-CM | POA: Diagnosis not present

## 2016-01-21 DIAGNOSIS — R937 Abnormal findings on diagnostic imaging of other parts of musculoskeletal system: Secondary | ICD-10-CM | POA: Insufficient documentation

## 2016-01-21 DIAGNOSIS — Z79891 Long term (current) use of opiate analgesic: Secondary | ICD-10-CM | POA: Diagnosis not present

## 2016-01-21 DIAGNOSIS — G8929 Other chronic pain: Secondary | ICD-10-CM

## 2016-01-21 DIAGNOSIS — M79604 Pain in right leg: Secondary | ICD-10-CM

## 2016-01-21 DIAGNOSIS — M545 Low back pain, unspecified: Secondary | ICD-10-CM

## 2016-01-21 DIAGNOSIS — M47896 Other spondylosis, lumbar region: Secondary | ICD-10-CM

## 2016-01-21 DIAGNOSIS — M79601 Pain in right arm: Secondary | ICD-10-CM

## 2016-01-21 DIAGNOSIS — M79605 Pain in left leg: Secondary | ICD-10-CM

## 2016-01-21 DIAGNOSIS — M7918 Myalgia, other site: Secondary | ICD-10-CM | POA: Insufficient documentation

## 2016-01-21 DIAGNOSIS — M79606 Pain in leg, unspecified: Secondary | ICD-10-CM

## 2016-01-21 DIAGNOSIS — M792 Neuralgia and neuritis, unspecified: Secondary | ICD-10-CM

## 2016-01-21 DIAGNOSIS — M47816 Spondylosis without myelopathy or radiculopathy, lumbar region: Secondary | ICD-10-CM

## 2016-01-21 DIAGNOSIS — M5412 Radiculopathy, cervical region: Secondary | ICD-10-CM

## 2016-01-21 DIAGNOSIS — F1190 Opioid use, unspecified, uncomplicated: Secondary | ICD-10-CM

## 2016-01-21 DIAGNOSIS — M48061 Spinal stenosis, lumbar region without neurogenic claudication: Secondary | ICD-10-CM | POA: Insufficient documentation

## 2016-01-21 HISTORY — DX: Abnormal findings on diagnostic imaging of other parts of musculoskeletal system: R93.7

## 2016-01-21 HISTORY — DX: Neuralgia and neuritis, unspecified: M79.2

## 2016-01-21 MED ORDER — BACLOFEN 10 MG PO TABS: 10.0000 mg | ORAL_TABLET | Freq: Three times a day (TID) | ORAL | Status: DC

## 2016-01-21 MED ORDER — OXYCODONE-ACETAMINOPHEN 2.5-325 MG PO TABS: 1.0000 | ORAL_TABLET | Freq: Two times a day (BID) | ORAL | Status: DC | PRN

## 2016-01-21 MED ORDER — OXYCODONE-ACETAMINOPHEN 2.5-325 MG PO TABS: 1.0000 | ORAL_TABLET | Freq: Two times a day (BID) | ORAL | Status: DC

## 2016-01-21 MED ORDER — GABAPENTIN 600 MG PO TABS: 600.0000 mg | ORAL_TABLET | Freq: Three times a day (TID) | ORAL | Status: DC

## 2016-01-21 NOTE — Progress Notes (Signed)
Patient's Name: John [name]  Patient type: Established  MRN: [id number]  Service setting: Ambulatory outpatient  DOB: 12/15/1955  Location: [facility]  DOS: 01/21/2016  Primary Care Physician: Cletis Athens, MD  Note by: Kathlen Brunswick. Dossie Arbour, M.D, DABA, DABAPM, DABPM, DABIPP, FIPP  Referring Physician: Cletis Athens, MD  Specialty: Board-Certified Interventional Pain Management  Last Visit to Pain Management: 12/31/2015   Primary Reason(s) for Visit: Encounter for prescription drug management (Level of risk: moderate) CC: Back Pain   HPI  John [name] is a 60 y.o. year old, male patient, who returns today as an established patient. He has Encounter for therapeutic drug level monitoring; Long term current use of opiate analgesic; Opiate use (7.5 MME/Day); Lumbar spondylosis; Chronic low back pain (Location of Primary Source of Pain) (Bilateral) (L>R); IVP/radiological dye Allergy; Thrombocytopenia ([place]); History of alcoholism; Carpal tunnel syndrome; Clinical depression; Difficulty in walking; Dystonia; Gastric ulcer; HCV (hepatitis C virus); H/O neoplasm; BP (high blood pressure); Decreased leukocytes; Hepatic cirrhosis ([place]); Loss of feeling or sensation; Absence of sensation; Current tobacco use; Has a tremor; Osteoarthritis of spine with radiculopathy, lumbosacral region; Peripheral neuropathy (Alcoholic); Chronic neck pain (Location of Tertiary source of pain) (Bilateral) (R>L); Cervical spondylosis; Obstructive sleep apnea; History of panic attacks; GERD (gastroesophageal reflux disease); Chronic constipation; 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[place]); Leukopenia; Lumbar facet syndrome (Location of Primary Source of Pain) (Bilateral) (L>R); Chronic lumbar radicular pain (Right S1 and Left L5) (Location of Secondary source of pain) (Bilateral) (L>R); Cervical radicular pain (Location of Tertiary source of pain) (Bilateral) (R>L); Chronic obstructive pulmonary disease (COPD)  ([place]); Head injury; Epileptic disorder ([place]); Chronic pain; Long term prescription opiate use; Encounter for chronic pain management; Radicular pain of shoulder (Bilateral) (R>L); Opioid-induced constipation ([place]); Personal history of tobacco use, presenting hazards to health; Neurogenic pain; Musculoskeletal pain; Chronic lower extremity pain (Location of Secondary source of pain) (Bilateral) (L>R); Chronic upper extremity pain (Location of Tertiary source of pain) (Bilateral) (R>L); Abnormal MRI, lumbar spine (2015); Lumbar central spinal stenosis (Severe at L4-5; L5-S1); Lumbar lateral recess stenosis (Bilateral L>R at L4-5; Left sided at L5-S1); Lumbar foraminal stenosis (Left L5-S1); Lumbar facet hypertrophy (L4-5 & L5-S1 L>R); Abnormal MRI, cervical spine (2014); and Cervical foraminal stenosis (Right sided C3-4 & C4-5) (Left-sided C5-6 and C7-T1) (Bilateral C6-7) on his problem list.. His primarily concern today is the Back Pain   Pain Assessment: Self-Reported Pain Score: 6 , clinically he looks like a 2/10 Reported level is inconsistent with clinical obrservations Pain Type: Chronic pain Pain Location: Back Pain Orientation: Lower Pain Descriptors / Indicators: Aching, Sharp, Dull, Stabbing, Throbbing, Pressure, Cramping Pain Frequency: Intermittent  The patient comes into the clinics today for pharmacological management of his chronic pain. I last saw this patient on 12/31/2015. The patient  reports that he does not use illicit drugs. His body mass index is 27.66 kg/(m^2).  Date of Last Visit: 11/18/15 Service Provided on Last Visit: Med Refill  Controlled Substance Pharmacotherapy Assessment & REMS (Risk Evaluation and Mitigation Strategy)  Analgesic: Oxycodone/APAP 2.5/325 one every 12 hours (5 mg/day of oxycodone) Pill Count: Oxycodone pill count #28.5/60 Filled 12-31-15 MME/day: 7.5 mg/day Pharmacokinetics: Onset of action (Liberation/Absorption): Within expected pharmacological  parameters Time to Peak effect (Distribution): Timing and results are as within normal expected parameters Duration of action (Metabolism/Excretion): Within normal limits for medication Pharmacodynamics: Analgesic Effect: More than 50% Activity Facilitation: Medication(s) allow patient to sit, stand, walk, and do the basic ADLs Perceived Effectiveness: Described as relatively effective, allowing  for increase in activities of daily living (ADL) Side-effects or Adverse reactions: None reported Monitoring: [place] PMP: Online review of the past 75-monthperiod conducted. Compliant with practice rules and regulations UDS Results/interpretation: The patient's last UDS was done on 11/18/2015 and it came back within normal limits with no unexpected results. Medication Assessment Form: Reviewed. Patient indicates being compliant with therapy Treatment compliance: Compliant Risk Assessment: Aberrant Behavior: None observed today Substance Use Disorder (SUD) Risk Level: No change since last visit Risk of opioid abuse or dependence: 0.7-3.0% with doses ? 36 MME/day and 6.1-26% with doses ? 120 MME/day. Opioid Risk Tool (ORT) Score: Total Score: 3 Low Risk for SUD (Score <3) Depression Scale Score: PHQ-2: PHQ-2 Total Score: 0 No depression (0) PHQ-9: PHQ-9 Total Score: 0 No depression (0-4)  Pharmacologic Plan: No change in therapy, at this time  Laboratory Chemistry  Inflammation Markers Lab Results  Component Value Date   ESRSEDRATE 15 09/22/2015   CRP 0.5 09/22/2015    Renal Function Lab Results  Component Value Date   BUN 9 08/08/2015   CREATININE 0.57* 08/08/2015   GFRAA >60 08/08/2015   GFRNONAA >60 08/08/2015    Hepatic Function Lab Results  Component Value Date   AST 23 08/08/2015   ALT 18 08/08/2015   ALBUMIN 4.5 08/08/2015    Electrolytes Lab Results  Component Value Date   NA 141 08/08/2015   K 3.9 08/08/2015   CL 102 08/08/2015   CALCIUM 9.5 08/08/2015   MG 1.9  09/22/2015    Pain Modulating Vitamins Lab Results  Component Value Date   VITAMINB12 594 08/08/2015    Coagulation Parameters Lab Results  Component Value Date   INR 1.1 11/09/2012   LABPROT 14.7 11/09/2012    Note: I personally reviewed the above data. Results made available to patient.  Recent Diagnostic Imaging  Ct Chest Lung Ca Screen Low Dose W/o Cm  01/20/2016  CLINICAL DATA:  60year old male current smoker with 44 pack-year history of smoking. Lung cancer screening examination. EXAM: CT CHEST WITHOUT CONTRAST LOW-DOSE FOR LUNG CANCER SCREENING TECHNIQUE: Multidetector CT imaging of the chest was performed following the standard protocol without IV contrast. COMPARISON:  Low-dose lung cancer screening chest CT 08/07/2014. FINDINGS: Mediastinum/Nodes: Heart size is normal. There is no significant pericardial fluid, thickening or pericardial calcification. There is atherosclerosis of the thoracic aorta, the great vessels of the mediastinum and the coronary arteries, including calcified atherosclerotic plaque in the left anterior descending and right coronary arteries. Calcifications of the aortic valve. No pathologically enlarged mediastinal or hilar lymph nodes. Please note that accurate exclusion of hilar adenopathy is limited on noncontrast CT scans. Esophagus is unremarkable in appearance. No axillary lymphadenopathy. Lungs/Pleura: Tiny pulmonary nodule in the left upper lobe (image 128 of series 3) with a volume derived mean diameter of 2.8 mm, similar to the prior study. No other larger more suspicious appearing pulmonary nodules or masses are noted on today's examination. Mild diffuse bronchial wall thickening with very mild centrilobular and paraseptal emphysema most apparent the lung apices. No acute consolidative airspace disease. No pleural effusions. Upper abdomen: 1.3 cm low-attenuation lesion in segment 7 of the liver is incompletely characterized on today's noncontrast CT  examination, but is similar to the prior study, likely a small cyst. Tiny gallstones lie dependently in the gallbladder. Liver has a shrunken appearance and nodular contour, which may suggest underlying cirrhosis. Atherosclerosis. Musculoskeletal: There are no aggressive appearing lytic or blastic lesions noted in the visualized portions of  the skeleton. IMPRESSION: 1. Lung-RADS Category 2S, benign appearance or behavior. Continue annual screening with low-dose chest CT without contrast in 12 months. 2. The "S" modifier above refers to potentially clinically significant non lung cancer related findings. Specifically, there is atherosclerosis, including 2 vessel coronary artery disease. Please note that although the presence of coronary artery calcium documents the presence of coronary artery disease, the severity of this disease and any potential stenosis cannot be assessed on this non-gated CT examination. Assessment for potential risk factor modification, dietary therapy or pharmacologic therapy may be warranted, if clinically indicated. 3. There are calcifications of the aortic valve. Echocardiographic correlation for evaluation of potential valvular dysfunction may be warranted if clinically indicated. 4. Cholelithiasis without evidence of acute cholecystitis at this time. 5. Morphologic changes in the liver suggestive of underlying cirrhosis. Electronically Signed   By: Vinnie Langton M.D.   On: 01/20/2016 15:35    Meds  The patient has a current medication list which includes the following prescription(s): baclofen, esomeprazole, folic acid, gabapentin, oxycodone-acetaminophen, oxycodone-acetaminophen, oxycodone-acetaminophen, and ranitidine.  Current Outpatient Prescriptions on File Prior to Visit  Medication Sig  . esomeprazole (NEXIUM) 20 MG capsule Take 20 mg by mouth daily at 12 noon.  . folic acid (FOLVITE) 1 MG tablet Take 1 mg by mouth.  . ranitidine (ZANTAC) 75 MG tablet Take 75 mg by  mouth daily.   No current facility-administered medications on file prior to visit.    ROS  Constitutional: Denies any fever or chills Gastrointestinal: No reported hemesis, hematochezia, vomiting, or acute GI distress Musculoskeletal: Denies any acute onset joint swelling, redness, loss of ROM, or weakness Neurological: No reported episodes of acute onset apraxia, aphasia, dysarthria, agnosia, amnesia, paralysis, loss of coordination, or loss of consciousness  Allergies  John [name] is allergic to codeine sulfate; morphine; and duloxetine.  [place]  Medical:  John [name]  has a past medical history of Anxiety; Depression; COPD (chronic obstructive pulmonary disease) ([place]); Hypertension; Hemorrhoids; History of GI bleed; Hepatitis B; Leukopenia; Gout; Pulse irregularity; Seizures ([place]); Dystonia; Spinal stenosis; Head injury (06/23/2015); Febrile seizures ([place]); Back ache (05/06/2014); Extremity pain (05/06/2014); Foot pain (09/11/2014); Personal history of tobacco use, presenting hazards to health (01/09/2016); Peripheral neuropathy ([place]) (06/23/2015); Uncomplicated opioid dependence ([place]) (06/23/2015); Cervical pain (05/06/2014); and Osteoarthritis of spine with radiculopathy, lumbar region (06/23/2015). Family: family history includes Cancer in his father; Cancer (age of onset: 38) in his brother; Heart disease in his father; Stroke in his father and mother. Surgical:  has past surgical history that includes surgery for cervical neck fracture and Esophagogastroduodenoscopy (11/2013). Tobacco:  reports that he has been smoking Cigarettes.  He has a 35 pack-year smoking history. He does not have any smokeless tobacco history on file. Alcohol:  reports that he does not drink alcohol. Drug:  reports that he does not use illicit drugs.  Constitutional Exam  Vitals: Blood pressure 114/54, temperature 98.5 F (36.9 C), height 6' (1.829 m), weight 204 lb (92.534 kg). General appearance: Well nourished, well  developed, and well hydrated. In no acute distress Calculated BMI/Body habitus: Body mass index is 27.66 kg/(m^2). (25-29.9 kg/m2) Overweight - 20% higher incidence of chronic pain Psych/Mental status: Alert and oriented x 3 (person, place, & time) Eyes: PERLA Respiratory: No evidence of acute respiratory distress  Cervical Spine Exam  Inspection: No masses, redness, or swelling Alignment: Symmetrical ROM: Functional: Reduced REM Stability: No instability detected Muscle strength & Tone: Functionally intact Sensory: Unimpaired Palpation: Tender  Upper Extremity (UE) Exam  Side: Right upper extremity  Side: Left upper extremity  Inspection: No masses, redness, swelling, or asymmetry  Inspection: No masses, redness, swelling, or asymmetry  ROM:  ROM:  Functional: ROM is within functional limits [facility])  Functional: ROM is within functional limits [facility])  Muscle strength & Tone: Functionally intact  Muscle strength & Tone: Functionally intact  Sensory: Unimpaired  Sensory: Unimpaired  Palpation: Non-contributory  Palpation: Non-contributory   Thoracic Spine Exam  Inspection: No masses, redness, or swelling Alignment: Symmetrical ROM: Functional: ROM is within functional limits [facility]) Stability: No instability detected Sensory: Unimpaired Muscle strength & Tone: Functionally intact Palpation: No complaints of tenderness  Lumbar Spine Exam  Inspection: No masses, redness, or swelling Alignment: Symmetrical ROM: Functional: Reduced REM Stability: No instability detected Muscle strength & Tone: Functionally intact Sensory: Unimpaired Palpation: Tender Provocative Tests: Lumbar Hyperextension and rotation test: deferred Patrick's Maneuver: deferred  Gait & Posture Assessment  Ambulation: Unassisted Gait: Antalgic Posture: WNL  Lower Extremity Exam    Side: Right lower extremity  Side: Left lower extremity  Inspection: No masses, redness, swelling, or asymmetry ROM:   Inspection: No masses, redness, swelling, or asymmetry ROM:  Functional: ROM is within functional limits [facility])  Functional: ROM is within functional limits [facility])  Muscle strength & Tone: Functionally intact  Muscle strength & Tone: Functionally intact  Sensory: Unimpaired  Sensory: Unimpaired  Palpation: Non-contributory  Palpation: Non-contributory   Assessment & Plan  Primary Diagnosis & Pertinent Problem List: The primary encounter diagnosis was Chronic pain. Diagnoses of Encounter for therapeutic drug level monitoring, Long term current use of opiate analgesic, Chronic low back pain (Location of Primary Source of Pain) (Bilateral) (L>R), Neurogenic pain, Musculoskeletal pain, Chronic pain of lower extremity, unspecified laterality, Chronic lumbar radicular pain (Right S1 and Left L5) (Location of Secondary source of pain) (Bilateral) (L>R), Chronic upper extremity pain (Location of Tertiary source of pain) (Bilateral) (R>L), Abnormal MRI, lumbar spine (2015), Lumbar central spinal stenosis (Severe at L4-5; L5-S1), Lumbar lateral recess stenosis (Bilateral L>R at L4-5; Left sided at L5-S1), Lumbar foraminal stenosis (Left L5-S1), Lumbar facet hypertrophy (L4-5 & L5-S1 L>R), Abnormal MRI, cervical spine (2014), Cervical foraminal stenosis (Right sided C3-4 & C4-5) (Left-sided C5-6 and C7-T1) (Bilateral C6-7), Opiate use (7.5 MME/Day), Cervical radicular pain (Location of Tertiary source of pain) (Bilateral) (R>L), and Lumbar facet syndrome (Location of Primary Source of Pain) (Bilateral) (L>R) were also pertinent to this visit.  Visit Diagnosis: 1. Chronic pain   2. Encounter for therapeutic drug level monitoring   3. Long term current use of opiate analgesic   4. Chronic low back pain (Location of Primary Source of Pain) (Bilateral) (L>R)   5. Neurogenic pain   6. Musculoskeletal pain   7. Chronic pain of lower extremity, unspecified laterality   8. Chronic lumbar radicular pain (Right S1 and  Left L5) (Location of Secondary source of pain) (Bilateral) (L>R)   9. Chronic upper extremity pain (Location of Tertiary source of pain) (Bilateral) (R>L)   10. Abnormal MRI, lumbar spine (2015)   11. Lumbar central spinal stenosis (Severe at L4-5; L5-S1)   12. Lumbar lateral recess stenosis (Bilateral L>R at L4-5; Left sided at L5-S1)   13. Lumbar foraminal stenosis (Left L5-S1)   14. Lumbar facet hypertrophy (L4-5 & L5-S1 L>R)   15. Abnormal MRI, cervical spine (2014)   16. Cervical foraminal stenosis (Right sided C3-4 & C4-5) (Left-sided C5-6 and C7-T1) (Bilateral C6-7)   17. Opiate use (7.5 MME/Day)   18. Cervical  radicular pain (Location of Tertiary source of pain) (Bilateral) (R>L)   19. Lumbar facet syndrome (Location of Primary Source of Pain) (Bilateral) (L>R)     Problems updated and reviewed during this visit: Problem  Neurogenic Pain  Musculoskeletal Pain  Chronic lower extremity pain (Location of Secondary source of pain) (Bilateral) (L>R)  Chronic upper extremity pain (Location of Tertiary source of pain) (Bilateral) (R>L)  Abnormal MRI, lumbar spine (2015)   FINDINGS:  L3-4: Tiny right foraminal and extra foraminal disc bulge adjacent to but not compressing the L3 nerve lateral to the neural foramen. L4-5: There is a small broad-based disc bulge. There is also hypertrophy of the ligamentum flavum and facet joints, left more than right creating moderately severe spinal stenosis and bilateral lateral recess stenosis, left greater than right. This appears slightly progressed since 03/15/2013.  L5-S1: Severe bilateral facet arthritis with ligamentum flavum hypertrophy creating bilateral lateral recess stenosis, left greater than right, with left foraminal stenosis. Left lateral recess stenosis appears slightly progressed.  IMPRESSION:  1. Progressive moderately severe spinal and bilateral lateral recess stenosis at L4-5.  2. Progressive spinal stenosis and left lateral  recess stenosis at L5-S1.    Lumbar central spinal stenosis (Severe at L4-5; L5-S1)  Lumbar lateral recess stenosis (Bilateral L>R at L4-5; Left sided at L5-S1)  Lumbar foraminal stenosis (Left L5-S1)  Lumbar facet hypertrophy (L4-5 & L5-S1 L>R)  Abnormal MRI, cervical spine (2014)   C3-4 small right paracentral disc protrusion with narrowing of the right neural foramen. No high-grade spinal stenosis.  C4-5 mild asymmetric osteophyte formation to the right with moderate narrowing right neural foramen.  C5-6 asymmetric osteophyte formation to the left with moderate narrowing left neural foramen.  C6-7 degenerative endplate osteophyte formation with bilateral moderate neural foraminal narrowing.  C7-T1 annular bulge an endplate osteophyte formation with prominent narrowing left neural foramen.   Cervical foraminal stenosis (Right sided C3-4 & C4-5) (Left-sided C5-6 and C7-T1) (Bilateral C6-7)  Peripheral neuropathy (Alcoholic)  Chronic neck pain (Location of Tertiary source of pain) (Bilateral) (R>L)  Lumbar facet syndrome (Location of Primary Source of Pain) (Bilateral) (L>R)  Chronic lumbar radicular pain (Right S1 and Left L5) (Location of Secondary source of pain) (Bilateral) (L>R)  Cervical radicular pain (Location of Tertiary source of pain) (Bilateral) (R>L)  Opiate use (7.5 MME/Day)   Oxycodone/APAP 2.5/325 one every 12 hours (5 mg/day of oxycodone)   IVP/radiological dye Allergy  History of alcoholism  Hepatic Cirrhosis ([facility])    Problem-specific Plan(s): No problem-specific assessment & plan notes found for this encounter.  No new assessment & plan notes have been filed under this hospital service since the last note was generated. Service: Pain Management   Plan of Care   Problem List Items Addressed This Visit      High   Abnormal MRI, cervical spine (2014)   Abnormal MRI, lumbar spine (2015)   Cervical foraminal stenosis (Right sided C3-4 & C4-5) (Left-sided  C5-6 and C7-T1) (Bilateral C6-7) (Chronic)   Cervical radicular pain (Location of Tertiary source of pain) (Bilateral) (R>L) (Chronic)   Relevant Orders   CERVICAL EPIDURAL STEROID INJECTION   Chronic low back pain (Location of Primary Source of Pain) (Bilateral) (L>R) (Chronic)   Relevant Medications   oxycodone-acetaminophen (PERCOCET) 2.5-325 MG tablet   oxycodone-acetaminophen (PERCOCET) 2.5-325 MG tablet   oxycodone-acetaminophen (PERCOCET) 2.5-325 MG tablet   baclofen (LIORESAL) 10 MG tablet   Chronic lower extremity pain (Location of Secondary source of pain) (Bilateral) (L>R) (Chronic)  Chronic lumbar radicular pain (Right S1 and Left L5) (Location of Secondary source of pain) (Bilateral) (L>R) (Chronic)   Chronic pain - Primary (Chronic)   Relevant Medications   oxycodone-acetaminophen (PERCOCET) 2.5-325 MG tablet   oxycodone-acetaminophen (PERCOCET) 2.5-325 MG tablet   oxycodone-acetaminophen (PERCOCET) 2.5-325 MG tablet   baclofen (LIORESAL) 10 MG tablet   gabapentin (NEURONTIN) 600 MG tablet   Chronic upper extremity pain (Location of Tertiary source of pain) (Bilateral) (R>L) (Chronic)   Relevant Medications   oxycodone-acetaminophen (PERCOCET) 2.5-325 MG tablet   oxycodone-acetaminophen (PERCOCET) 2.5-325 MG tablet   oxycodone-acetaminophen (PERCOCET) 2.5-325 MG tablet   baclofen (LIORESAL) 10 MG tablet   gabapentin (NEURONTIN) 600 MG tablet   Lumbar central spinal stenosis (Severe at L4-5; L5-S1) (Chronic)   Relevant Orders   LUMBAR EPIDURAL STEROID INJECTION   Lumbar facet hypertrophy (L4-5 & L5-S1 L>R) (Chronic)   Relevant Orders   LUMBAR FACET(MEDIAL BRANCH NERVE BLOCK) MBNB   Lumbar facet syndrome (Location of Primary Source of Pain) (Bilateral) (L>R) (Chronic)   Relevant Medications   oxycodone-acetaminophen (PERCOCET) 2.5-325 MG tablet   oxycodone-acetaminophen (PERCOCET) 2.5-325 MG tablet   oxycodone-acetaminophen (PERCOCET) 2.5-325 MG tablet   baclofen  (LIORESAL) 10 MG tablet   Lumbar foraminal stenosis (Left L5-S1) (Chronic)   Lumbar lateral recess stenosis (Bilateral L>R at L4-5; Left sided at L5-S1) (Chronic)   Relevant Orders   Lumbar Transforaminal epidural without steroid   Musculoskeletal pain (Chronic)   Relevant Medications   baclofen (LIORESAL) 10 MG tablet   Neurogenic pain (Chronic)   Relevant Medications   gabapentin (NEURONTIN) 600 MG tablet     Medium   Encounter for therapeutic drug level monitoring   Long term current use of opiate analgesic (Chronic)   Relevant Orders   ToxASSURE Select 13 (MW), Urine   Opiate use (7.5 MME/Day) (Chronic)       Pharmacotherapy (Medications Ordered): Meds ordered this encounter  Medications  . DISCONTD: baclofen (LIORESAL) 10 MG tablet    Sig: Take 1 tablet (10 mg total) by mouth 3 (three) times daily.    Dispense:  90 tablet    Refill:  1    Do not place this medication, or any other prescription from our practice, on "Automatic Refill". Patient may have prescription filled one day early if pharmacy is closed on scheduled refill date.  Marland Kitchen DISCONTD: gabapentin (NEURONTIN) 600 MG tablet    Sig: Take 1 tablet (600 mg total) by mouth 3 (three) times daily.    Dispense:  90 tablet    Refill:  1    Do not place this medication, or any other prescription from our practice, on "Automatic Refill". Patient may have prescription filled one day early if pharmacy is closed on scheduled refill date.  Marland Kitchen oxycodone-acetaminophen (PERCOCET) 2.5-325 MG tablet    Sig: Take 1 tablet by mouth every 12 (twelve) hours.    Dispense:  60 tablet    Refill:  0    Do not place this medication, or any other prescription from our practice, on "Automatic Refill". Patient may have prescription filled one day early if pharmacy is closed on scheduled refill date. Do not fill until: 01/21/16 To last until: 02/20/16  . oxycodone-acetaminophen (PERCOCET) 2.5-325 MG tablet    Sig: Take 1 tablet by mouth every 12  (twelve) hours as needed for pain.    Dispense:  60 tablet    Refill:  0    Do not place this medication, or any other prescription from our  practice, on "Automatic Refill". Patient may have prescription filled one day early if pharmacy is closed on scheduled refill date. Do not fill until: 02/20/16 To last until: 03/21/16  . oxycodone-acetaminophen (PERCOCET) 2.5-325 MG tablet    Sig: Take 1 tablet by mouth every 12 (twelve) hours as needed for pain.    Dispense:  60 tablet    Refill:  0    Do not place this medication, or any other prescription from our practice, on "Automatic Refill". Patient may have prescription filled one day early if pharmacy is closed on scheduled refill date. Do not fill until: 03/21/16 To last until: 04/20/16  . baclofen (LIORESAL) 10 MG tablet    Sig: Take 1 tablet (10 mg total) by mouth 3 (three) times daily.    Dispense:  90 tablet    Refill:  2    Do not place this medication, or any other prescription from our practice, on "Automatic Refill". Patient may have prescription filled one day early if pharmacy is closed on scheduled refill date.  Marland Kitchen DISCONTD: gabapentin (NEURONTIN) 600 MG tablet    Sig: Take 1 tablet (600 mg total) by mouth 3 (three) times daily.    Dispense:  90 tablet    Refill:  2    Do not place this medication, or any other prescription from our practice, on "Automatic Refill". Patient may have prescription filled one day early if pharmacy is closed on scheduled refill date.  . gabapentin (NEURONTIN) 600 MG tablet    Sig: Take 1 tablet (600 mg total) by mouth every 8 (eight) hours.    Dispense:  120 tablet    Refill:  2    Do not place this medication, or any other prescription from our practice, on "Automatic Refill". Patient may have prescription filled one day early if pharmacy is closed on scheduled refill date.    Lab-work & Procedure Ordered: Orders Placed This Encounter  Procedures  . LUMBAR FACET(MEDIAL BRANCH NERVE BLOCK) MBNB   . LUMBAR EPIDURAL STEROID INJECTION  . Lumbar Transforaminal epidural without steroid  . CERVICAL EPIDURAL STEROID INJECTION  . ToxASSURE Select 13 (MW), Urine    Imaging Ordered: None  Interventional Therapies: Scheduled:  None at this point.    Considering:   1. Diagnostic left L5-S1 lumbar epidural steroid injection under fluoroscopic guidance, with a without sedation.  2. Diagnostic bilateral L4-5 transforaminal epidural steroid injection under fluoroscopic guidance, with or without sedation.  3. Diagnostic bilateral lumbar facet block under fluoroscopic guidance and IV sedation.  4. Diagnostic right-sided cervical epidural steroid injection under fluoroscopic guidance with or without sedation.    PRN Procedures:   1. Diagnostic left L5-S1 lumbar epidural steroid injection under fluoroscopic guidance, with a without sedation.  2. Diagnostic bilateral L4-5 transforaminal epidural steroid injection under fluoroscopic guidance, with or without sedation.  3. Diagnostic bilateral lumbar facet block under fluoroscopic guidance and IV sedation.  4. Diagnostic right-sided cervical epidural steroid injection under fluoroscopic guidance with or without sedation.    Referral(s) or Consult(s): None at this time.  New Prescriptions   No medications on file    Medications administered during this visit: John [name] had no medications administered during this visit.  Requested PM Follow-up: Return in about 3 months (around 04/22/2016) for Medication Management, (3-Mo), Procedure (PRN - Patient will call).  Future Appointments Date Time Provider [place]  02/13/2016 10:30 AM [facility] None  02/13/2016 11:00 AM Lequita Asal, MD [facility] None  04/21/2016 9:20  AM Milinda Pointer, MD [facility] None    Primary Care Physician: Cletis Athens, MD Location: [facility] Note by: Kathlen Brunswick. Dossie Arbour, M.D, DABA, DABAPM, DABPM, DABIPP,  FIPP  Pain Score Disclaimer: We use the NRS-11 scale. This is a self-reported, subjective measurement of pain severity with only modest accuracy. It is used primarily to identify changes within a particular patient. It must be understood that outpatient pain scales are significantly less accurate that those used for research, where they can be applied under ideal controlled circumstances with minimal exposure to variables. In reality, the score is likely to be a combination of pain intensity and pain affect, where pain affect describes the degree of emotional arousal or changes in action readiness caused by the sensory experience of pain. Factors such as social and work situation, setting, emotional state, anxiety levels, expectation, and prior pain experience may influence pain perception and show large inter-individual differences that may also be affected by time variables.  Patient instructions provided during this appointment: Patient Instructions  Smoking Cessation, Tips for Success If you are ready to quit smoking, congratulations! You have chosen to help yourself be healthier. Cigarettes bring nicotine, tar, carbon monoxide, and other irritants into your body. Your lungs, heart, and blood vessels will be able to work better without these poisons. There are many different ways to quit smoking. Nicotine gum, nicotine patches, a nicotine inhaler, or nicotine nasal spray can help with physical craving. Hypnosis, support groups, and medicines help break the habit of smoking. WHAT THINGS CAN I DO TO MAKE QUITTING EASIER?  Here are some tips to help you quit for good:  Pick a date when you will quit smoking completely. Tell all of your friends and family about your plan to quit on that date.  Do not try to slowly cut down on the number of cigarettes you are smoking. Pick a quit date and quit smoking completely starting on that day.  Throw away all cigarettes.   Clean and remove all ashtrays from  your home, work, and car.  On a card, write down your reasons for quitting. Carry the card with you and read it when you get the urge to smoke.  Cleanse your body of nicotine. Drink enough water and fluids to keep your urine clear or pale yellow. Do this after quitting to flush the nicotine from your body.  Learn to predict your moods. Do not let a bad situation be your excuse to have a cigarette. Some situations in your life might tempt you into wanting a cigarette.  Never have "just one" cigarette. It leads to wanting another and another. Remind yourself of your decision to quit.  Change habits associated with smoking. If you smoked while driving or when feeling stressed, try other activities to replace smoking. Stand up when drinking your coffee. Brush your teeth after eating. Sit in a different chair when you read the paper. Avoid alcohol while trying to quit, and try to drink fewer caffeinated beverages. Alcohol and caffeine may urge you to smoke.  Avoid foods and drinks that can trigger a desire to smoke, such as sugary or spicy foods and alcohol.  Ask people who smoke not to smoke around you.  Have something planned to do right after eating or having a cup of coffee. For example, plan to take a walk or exercise.  Try a relaxation exercise to calm you down and decrease your stress. Remember, you may be tense and nervous for the first 2 weeks  after you quit, but this will pass.  Find new activities to keep your hands busy. Play with a pen, coin, or rubber band. Doodle or draw things on paper.  Brush your teeth right after eating. This will help cut down on the craving for the taste of tobacco after meals. You can also try mouthwash.   Use oral substitutes in place of cigarettes. Try using lemon drops, carrots, cinnamon sticks, or chewing gum. Keep them handy so they are available when you have the urge to smoke.  When you have the urge to smoke, try deep breathing.  Designate your  home as a nonsmoking area.  If you are a heavy smoker, ask your health care provider about a prescription for nicotine chewing gum. It can ease your withdrawal from nicotine.  Reward yourself. Set aside the cigarette money you save and buy yourself something nice.  Look for support from others. Join a support group or smoking cessation program. Ask someone at home or at work to help you with your plan to quit smoking.  Always ask yourself, "Do I need this cigarette or is this just a reflex?" Tell yourself, "Today, I choose not to smoke," or "I do not want to smoke." You are reminding yourself of your decision to quit.  Do not replace cigarette smoking with electronic cigarettes (commonly called e-cigarettes). The safety of e-cigarettes is unknown, and some may contain harmful chemicals.  If you relapse, do not give up! Plan ahead and think about what you will do the next time you get the urge to smoke. HOW WILL I FEEL WHEN I QUIT SMOKING? You may have symptoms of withdrawal because your body is used to nicotine (the addictive substance in cigarettes). You may crave cigarettes, be irritable, feel very hungry, cough often, get headaches, or have difficulty concentrating. The withdrawal symptoms are only temporary. They are strongest when you first quit but will go away within 10-14 days. When withdrawal symptoms occur, stay in control. Think about your reasons for quitting. Remind yourself that these are signs that your body is healing and getting used to being without cigarettes. Remember that withdrawal symptoms are easier to treat than the major diseases that smoking can cause.  Even after the withdrawal is over, expect periodic urges to smoke. However, these cravings are generally short lived and will go away whether you smoke or not. Do not smoke! WHAT RESOURCES ARE AVAILABLE TO HELP ME QUIT SMOKING? Your health care provider can direct you to community resources or hospitals for support, which  may include:  Group support.  Education.  Hypnosis.  Therapy.   This information is not intended to replace advice given to you by your health care provider. Make sure you discuss any questions you have with your health care provider.   Document Released: 05/21/2004 Document Revised: 09/13/2014 Document Reviewed: 02/08/2013 Elsevier Interactive Patient Education [employer].

## 2016-01-21 NOTE — Patient Instructions (Signed)
Smoking Cessation, Tips for Success If you are ready to quit smoking, congratulations! You have chosen to help yourself be healthier. Cigarettes bring nicotine, tar, carbon monoxide, and other irritants into your body. Your lungs, heart, and blood vessels will be able to work better without these poisons. There are many different ways to quit smoking. Nicotine gum, nicotine patches, a nicotine inhaler, or nicotine nasal spray can help with physical craving. Hypnosis, support groups, and medicines help break the habit of smoking. WHAT THINGS CAN I DO TO MAKE QUITTING EASIER?  Here are some tips to help you quit for good:  Pick a date when you will quit smoking completely. Tell all of your friends and family about your plan to quit on that date.  Do not try to slowly cut down on the number of cigarettes you are smoking. Pick a quit date and quit smoking completely starting on that day.  Throw away all cigarettes.   Clean and remove all ashtrays from your home, work, and car.  On a card, write down your reasons for quitting. Carry the card with you and read it when you get the urge to smoke.  Cleanse your body of nicotine. Drink enough water and fluids to keep your urine clear or pale yellow. Do this after quitting to flush the nicotine from your body.  Learn to predict your moods. Do not let a bad situation be your excuse to have a cigarette. Some situations in your life might tempt you into wanting a cigarette.  Never have "just one" cigarette. It leads to wanting another and another. Remind yourself of your decision to quit.  Change habits associated with smoking. If you smoked while driving or when feeling stressed, try other activities to replace smoking. Stand up when drinking your coffee. Brush your teeth after eating. Sit in a different chair when you read the paper. Avoid alcohol while trying to quit, and try to drink fewer caffeinated beverages. Alcohol and caffeine may urge you to  smoke.  Avoid foods and drinks that can trigger a desire to smoke, such as sugary or spicy foods and alcohol.  Ask people who smoke not to smoke around you.  Have something planned to do right after eating or having a cup of coffee. For example, plan to take a walk or exercise.  Try a relaxation exercise to calm you down and decrease your stress. Remember, you may be tense and nervous for the first 2 weeks after you quit, but this will pass.  Find new activities to keep your hands busy. Play with a pen, coin, or rubber band. Doodle or draw things on paper.  Brush your teeth right after eating. This will help cut down on the craving for the taste of tobacco after meals. You can also try mouthwash.   Use oral substitutes in place of cigarettes. Try using lemon drops, carrots, cinnamon sticks, or chewing gum. Keep them handy so they are available when you have the urge to smoke.  When you have the urge to smoke, try deep breathing.  Designate your home as a nonsmoking area.  If you are a heavy smoker, ask your health care provider about a prescription for nicotine chewing gum. It can ease your withdrawal from nicotine.  Reward yourself. Set aside the cigarette money you save and buy yourself something nice.  Look for support from others. Join a support group or smoking cessation program. Ask someone at home or at work to help you with your plan   to quit smoking.  Always ask yourself, "Do I need this cigarette or is this just a reflex?" Tell yourself, "Today, I choose not to smoke," or "I do not want to smoke." You are reminding yourself of your decision to quit.  Do not replace cigarette smoking with electronic cigarettes (commonly called e-cigarettes). The safety of e-cigarettes is unknown, and some may contain harmful chemicals.  If you relapse, do not give up! Plan ahead and think about what you will do the next time you get the urge to smoke. HOW WILL I FEEL WHEN I QUIT SMOKING? You  may have symptoms of withdrawal because your body is used to nicotine (the addictive substance in cigarettes). You may crave cigarettes, be irritable, feel very hungry, cough often, get headaches, or have difficulty concentrating. The withdrawal symptoms are only temporary. They are strongest when you first quit but will go away within 10-14 days. When withdrawal symptoms occur, stay in control. Think about your reasons for quitting. Remind yourself that these are signs that your body is healing and getting used to being without cigarettes. Remember that withdrawal symptoms are easier to treat than the major diseases that smoking can cause.  Even after the withdrawal is over, expect periodic urges to smoke. However, these cravings are generally short lived and will go away whether you smoke or not. Do not smoke! WHAT RESOURCES ARE AVAILABLE TO HELP ME QUIT SMOKING? Your health care provider can direct you to community resources or hospitals for support, which may include:  Group support.  Education.  Hypnosis.  Therapy.   This information is not intended to replace advice given to you by your health care provider. Make sure you discuss any questions you have with your health care provider.   Document Released: 05/21/2004 Document Revised: 09/13/2014 Document Reviewed: 02/08/2013 Elsevier Interactive Patient Education 2016 [employer]  

## 2016-01-21 NOTE — Progress Notes (Signed)
Safety precautions to be maintained throughout the outpatient stay will include: orient to surroundings, keep bed in low position, maintain call bell within reach at all times, provide assistance with transfer out of bed and ambulation. Oxycodone pill count #28.5/60  Filled 12-31-15

## 2016-01-29 ENCOUNTER — Telehealth: Payer: Self-pay | Admitting: *Deleted

## 2016-01-29 LAB — TOXASSURE SELECT 13 (MW), URINE

## 2016-01-29 NOTE — Telephone Encounter (Signed)
Notified patient of LDCT lung cancer screening results with recommendation for 12 month follow up imaging. Also notified of incidental finding noted below. Patient verbalizes understanding. Copy of report sent to PCP.  IMPRESSION: 1. Lung-RADS Category 2S, benign appearance or behavior. Continue annual screening with low-dose chest CT without contrast in 12 months. 2. The "S" modifier above refers to potentially clinically significant non lung cancer related findings. Specifically, there is atherosclerosis, including 2 vessel coronary artery disease. Please note that although the presence of coronary artery calcium documents the presence of coronary artery disease, the severity of this disease and any potential stenosis cannot be assessed on this non-gated CT examination. Assessment for potential risk factor modification, dietary therapy or pharmacologic therapy may be warranted, if clinically indicated. 3. There are calcifications of the aortic valve. Echocardiographic correlation for evaluation of potential valvular dysfunction may be warranted if clinically indicated. 4. Cholelithiasis without evidence of acute cholecystitis at this time. 5. Morphologic changes in the liver suggestive of underlying cirrhosis.

## 2016-02-13 ENCOUNTER — Inpatient Hospital Stay: Payer: Medicare PPO | Attending: Hematology and Oncology

## 2016-02-13 ENCOUNTER — Inpatient Hospital Stay (HOSPITAL_BASED_OUTPATIENT_CLINIC_OR_DEPARTMENT_OTHER): Payer: Medicare PPO | Admitting: Hematology and Oncology

## 2016-02-13 VITALS — BP 120/63 | Temp 95.1°F | Resp 18 | Ht 72.0 in | Wt 201.6 lb

## 2016-02-13 DIAGNOSIS — K7460 Unspecified cirrhosis of liver: Secondary | ICD-10-CM

## 2016-02-13 DIAGNOSIS — F17210 Nicotine dependence, cigarettes, uncomplicated: Secondary | ICD-10-CM

## 2016-02-13 DIAGNOSIS — Z79899 Other long term (current) drug therapy: Secondary | ICD-10-CM | POA: Insufficient documentation

## 2016-02-13 DIAGNOSIS — M479 Spondylosis, unspecified: Secondary | ICD-10-CM | POA: Insufficient documentation

## 2016-02-13 DIAGNOSIS — D696 Thrombocytopenia, unspecified: Secondary | ICD-10-CM

## 2016-02-13 DIAGNOSIS — J449 Chronic obstructive pulmonary disease, unspecified: Secondary | ICD-10-CM | POA: Diagnosis not present

## 2016-02-13 DIAGNOSIS — B1910 Unspecified viral hepatitis B without hepatic coma: Secondary | ICD-10-CM | POA: Insufficient documentation

## 2016-02-13 DIAGNOSIS — G629 Polyneuropathy, unspecified: Secondary | ICD-10-CM | POA: Diagnosis not present

## 2016-02-13 DIAGNOSIS — F329 Major depressive disorder, single episode, unspecified: Secondary | ICD-10-CM | POA: Insufficient documentation

## 2016-02-13 DIAGNOSIS — I10 Essential (primary) hypertension: Secondary | ICD-10-CM | POA: Diagnosis not present

## 2016-02-13 DIAGNOSIS — M109 Gout, unspecified: Secondary | ICD-10-CM | POA: Insufficient documentation

## 2016-02-13 DIAGNOSIS — F419 Anxiety disorder, unspecified: Secondary | ICD-10-CM | POA: Insufficient documentation

## 2016-02-13 LAB — CBC WITH DIFFERENTIAL/PLATELET
Basophils Absolute: 0 10*3/uL (ref 0–0.1)
Basophils Relative: 1 %
Eosinophils Absolute: 0.1 10*3/uL (ref 0–0.7)
Eosinophils Relative: 4 %
HCT: 41.2 % (ref 40.0–52.0)
Hemoglobin: 14.4 g/dL (ref 13.0–18.0)
Lymphocytes Relative: 32 %
Lymphs Abs: 1 10*3/uL (ref 1.0–3.6)
MCH: 33.1 pg (ref 26.0–34.0)
MCHC: 34.9 g/dL (ref 32.0–36.0)
MCV: 94.9 fL (ref 80.0–100.0)
Monocytes Absolute: 0.3 10*3/uL (ref 0.2–1.0)
Monocytes Relative: 9 %
Neutro Abs: 1.7 10*3/uL (ref 1.4–6.5)
Neutrophils Relative %: 54 %
Platelets: 60 10*3/uL — ABNORMAL LOW (ref 150–440)
RBC: 4.34 MIL/uL — ABNORMAL LOW (ref 4.40–5.90)
RDW: 13.8 % (ref 11.5–14.5)
WBC: 3.2 10*3/uL — ABNORMAL LOW (ref 3.8–10.6)

## 2016-02-13 LAB — COMPREHENSIVE METABOLIC PANEL
ALT: 18 U/L (ref 17–63)
AST: 25 U/L (ref 15–41)
Albumin: 4.6 g/dL (ref 3.5–5.0)
Alkaline Phosphatase: 60 U/L (ref 38–126)
Anion gap: 6 (ref 5–15)
BUN: 13 mg/dL (ref 6–20)
CO2: 29 mmol/L (ref 22–32)
Calcium: 8.9 mg/dL (ref 8.9–10.3)
Chloride: 102 mmol/L (ref 101–111)
Creatinine, Ser: 0.8 mg/dL (ref 0.61–1.24)
GFR calc Af Amer: >60 mL/min (ref >60)
GFR calc non Af Amer: >60 mL/min (ref >60)
Glucose, Bld: 97 mg/dL (ref 65–99)
Potassium: 4.1 mmol/L (ref 3.5–5.1)
Sodium: 137 mmol/L (ref 135–145)
Total Bilirubin: 0.7 mg/dL (ref 0.3–1.2)
Total Protein: 7.8 g/dL (ref 6.5–8.1)

## 2016-02-13 LAB — PROTIME-INR
INR: 1.1
Prothrombin Time: 14.4 seconds (ref 11.4–15.0)

## 2016-02-13 NOTE — Progress Notes (Signed)
Pt reports that in winter he weighed 215 and had lost weight since, however now is out in garden working.  Pt concenred about Hep B and A vaccine. NP wanted him to get from [facility] with Dr. Tiffany Kocher.   Pt concerned about liver testing.  According to pt one MD says he has liver Cancer and another says he dont.
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
## 2016-03-29 ENCOUNTER — Other Ambulatory Visit: Payer: Self-pay | Admitting: Pain Medicine

## 2016-03-29 DIAGNOSIS — M792 Neuralgia and neuritis, unspecified: Secondary | ICD-10-CM

## 2016-03-30 NOTE — Telephone Encounter (Signed)
Note: It is the responsibility of all patients to have an up to date list of all the medications that need to be refilled at the time of their appointment.  Pain Management Medication Policy: Medication changes and refills will be conducted during appointments only, after determining if the medical indications for the use of the medication are still present. No fax, phone, or electronic refills will be conducted outside of an evaluation appointment. Patients are encouraged to call and set up an appointment to comply with stipulated policy.

## 2016-04-21 ENCOUNTER — Ambulatory Visit: Payer: Medicare PPO | Attending: Pain Medicine | Admitting: Pain Medicine

## 2016-04-21 ENCOUNTER — Encounter: Payer: Self-pay | Admitting: Pain Medicine

## 2016-04-21 VITALS — BP 127/70 | HR 66 | Temp 98.0°F | Resp 18 | Ht 72.0 in | Wt 188.0 lb

## 2016-04-21 DIAGNOSIS — Z7189 Other specified counseling: Secondary | ICD-10-CM | POA: Diagnosis not present

## 2016-04-21 DIAGNOSIS — M47816 Spondylosis without myelopathy or radiculopathy, lumbar region: Secondary | ICD-10-CM

## 2016-04-21 DIAGNOSIS — M4726 Other spondylosis with radiculopathy, lumbar region: Secondary | ICD-10-CM | POA: Diagnosis not present

## 2016-04-21 DIAGNOSIS — K259 Gastric ulcer, unspecified as acute or chronic, without hemorrhage or perforation: Secondary | ICD-10-CM | POA: Diagnosis not present

## 2016-04-21 DIAGNOSIS — M792 Neuralgia and neuritis, unspecified: Secondary | ICD-10-CM

## 2016-04-21 DIAGNOSIS — Z87891 Personal history of nicotine dependence: Secondary | ICD-10-CM | POA: Diagnosis not present

## 2016-04-21 DIAGNOSIS — M25511 Pain in right shoulder: Secondary | ICD-10-CM | POA: Insufficient documentation

## 2016-04-21 DIAGNOSIS — M545 Low back pain: Secondary | ICD-10-CM

## 2016-04-21 DIAGNOSIS — F1190 Opioid use, unspecified, uncomplicated: Secondary | ICD-10-CM

## 2016-04-21 DIAGNOSIS — F410 Panic disorder [episodic paroxysmal anxiety] without agoraphobia: Secondary | ICD-10-CM | POA: Insufficient documentation

## 2016-04-21 DIAGNOSIS — K219 Gastro-esophageal reflux disease without esophagitis: Secondary | ICD-10-CM | POA: Insufficient documentation

## 2016-04-21 DIAGNOSIS — K7469 Other cirrhosis of liver: Secondary | ICD-10-CM | POA: Diagnosis not present

## 2016-04-21 DIAGNOSIS — F329 Major depressive disorder, single episode, unspecified: Secondary | ICD-10-CM | POA: Insufficient documentation

## 2016-04-21 DIAGNOSIS — M79602 Pain in left arm: Secondary | ICD-10-CM | POA: Insufficient documentation

## 2016-04-21 DIAGNOSIS — M5416 Radiculopathy, lumbar region: Secondary | ICD-10-CM

## 2016-04-21 DIAGNOSIS — G5600 Carpal tunnel syndrome, unspecified upper limb: Secondary | ICD-10-CM | POA: Insufficient documentation

## 2016-04-21 DIAGNOSIS — G8929 Other chronic pain: Secondary | ICD-10-CM

## 2016-04-21 DIAGNOSIS — M542 Cervicalgia: Secondary | ICD-10-CM | POA: Diagnosis not present

## 2016-04-21 DIAGNOSIS — K5903 Drug induced constipation: Secondary | ICD-10-CM | POA: Insufficient documentation

## 2016-04-21 DIAGNOSIS — G4733 Obstructive sleep apnea (adult) (pediatric): Secondary | ICD-10-CM | POA: Insufficient documentation

## 2016-04-21 DIAGNOSIS — M4806 Spinal stenosis, lumbar region: Secondary | ICD-10-CM | POA: Insufficient documentation

## 2016-04-21 DIAGNOSIS — M791 Myalgia: Secondary | ICD-10-CM

## 2016-04-21 DIAGNOSIS — Z79891 Long term (current) use of opiate analgesic: Secondary | ICD-10-CM | POA: Diagnosis not present

## 2016-04-21 DIAGNOSIS — M4802 Spinal stenosis, cervical region: Secondary | ICD-10-CM | POA: Insufficient documentation

## 2016-04-21 DIAGNOSIS — M7918 Myalgia, other site: Secondary | ICD-10-CM

## 2016-04-21 DIAGNOSIS — F1021 Alcohol dependence, in remission: Secondary | ICD-10-CM | POA: Insufficient documentation

## 2016-04-21 DIAGNOSIS — D696 Thrombocytopenia, unspecified: Secondary | ICD-10-CM | POA: Diagnosis not present

## 2016-04-21 DIAGNOSIS — B1920 Unspecified viral hepatitis C without hepatic coma: Secondary | ICD-10-CM | POA: Insufficient documentation

## 2016-04-21 DIAGNOSIS — I10 Essential (primary) hypertension: Secondary | ICD-10-CM | POA: Diagnosis not present

## 2016-04-21 DIAGNOSIS — M79606 Pain in leg, unspecified: Secondary | ICD-10-CM

## 2016-04-21 DIAGNOSIS — M79605 Pain in left leg: Secondary | ICD-10-CM | POA: Diagnosis present

## 2016-04-21 DIAGNOSIS — M25512 Pain in left shoulder: Secondary | ICD-10-CM | POA: Insufficient documentation

## 2016-04-21 DIAGNOSIS — M79604 Pain in right leg: Secondary | ICD-10-CM | POA: Diagnosis present

## 2016-04-21 DIAGNOSIS — M79601 Pain in right arm: Secondary | ICD-10-CM | POA: Diagnosis not present

## 2016-04-21 DIAGNOSIS — J449 Chronic obstructive pulmonary disease, unspecified: Secondary | ICD-10-CM | POA: Diagnosis not present

## 2016-04-21 DIAGNOSIS — R262 Difficulty in walking, not elsewhere classified: Secondary | ICD-10-CM | POA: Insufficient documentation

## 2016-04-21 MED ORDER — OXYCODONE-ACETAMINOPHEN 2.5-325 MG PO TABS: 1.0000 | ORAL_TABLET | Freq: Two times a day (BID) | ORAL | 0 refills | Status: DC | PRN

## 2016-04-21 MED ORDER — GABAPENTIN 600 MG PO TABS: 600.0000 mg | ORAL_TABLET | Freq: Three times a day (TID) | ORAL | 2 refills | Status: DC

## 2016-04-21 MED ORDER — OXYCODONE-ACETAMINOPHEN 2.5-325 MG PO TABS: 1.0000 | ORAL_TABLET | Freq: Two times a day (BID) | ORAL | 0 refills | Status: DC

## 2016-04-21 MED ORDER — BACLOFEN 10 MG PO TABS: 10.0000 mg | ORAL_TABLET | Freq: Three times a day (TID) | ORAL | 2 refills | Status: DC

## 2016-04-21 MED ORDER — MAGNESIUM OXIDE -MG SUPPLEMENT 500 MG PO CAPS: 1.0000 | ORAL_CAPSULE | Freq: Two times a day (BID) | ORAL | 99 refills | Status: DC

## 2016-04-21 NOTE — Progress Notes (Signed)
Safety precautions to be maintained throughout the outpatient stay will include: orient to surroundings, keep bed in low position, maintain call bell within reach at all times, provide assistance with transfer out of bed and ambulation.  Bottle labeled oxycodone/apap 2.5/325 mg # 1/60  Filled 03-11-16

## 2016-04-21 NOTE — Progress Notes (Signed)
Patient's Name: John [name]  Patient type: Established  MRN: [id number]  Service setting: Ambulatory outpatient  DOB: 11/01/1955  Location: [facility]  DOS: 04/21/2016  Primary Care Physician: Corky Downs, MD  Note by: Sydnee Levans. Laban Emperor, M.D  Referring Physician: Corky Downs, MD  Specialty: Interventional Pain Management  Last Visit to Pain Management: 03/29/2016   Primary Reason(s) for Visit: Encounter for prescription drug management (Level of risk: moderate) CC: Back Pain (low) and Leg Pain (bilateral)   HPI  John [name] is a 60 y.o. year old, male patient, who returns today as an established patient. He has Encounter for therapeutic drug level monitoring; Long term current use of opiate analgesic; Opiate use (7.5 MME/Day); Lumbar spondylosis; Chronic low back pain (Location of Primary Source of Pain) (Bilateral) (L>R); IVP/radiological dye Allergy; Thrombocytopenia (HCC); History of alcoholism; Carpal tunnel syndrome; Clinical depression; Difficulty in walking; Dystonia; Gastric ulcer; HCV (hepatitis C virus); H/O neoplasm; BP (high blood pressure); Decreased leukocytes; Hepatic cirrhosis (HCC); Loss of feeling or sensation; Absence of sensation; Current tobacco use; Has a tremor; Osteoarthritis of spine with radiculopathy, lumbosacral region; Peripheral neuropathy (Alcoholic); Chronic neck pain (Location of Tertiary source of pain) (Bilateral) (R>L); Cervical spondylosis; Obstructive sleep apnea; History of panic attacks; GERD (gastroesophageal reflux disease); Chronic constipation; 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HCC); Leukopenia; Lumbar facet syndrome (Location of Primary Source of Pain) (Bilateral) (L>R); Chronic lumbar radicular pain (Right S1 and Left L5) (Location of Secondary source of pain) (Bilateral) (L>R); Cervical radicular pain (Location of Tertiary source of pain) (Bilateral) (R>L); Chronic obstructive pulmonary disease (COPD) (HCC); Head injury; Epileptic  disorder (HCC); Chronic pain; Long term prescription opiate use; Encounter for chronic pain management; Radicular pain of shoulder (Bilateral) (R>L); Opioid-induced constipation (OIC); Personal history of tobacco use, presenting hazards to health; Neurogenic pain; Musculoskeletal pain; Chronic lower extremity pain (Location of Secondary source of pain) (Bilateral) (L>R); Chronic upper extremity pain (Location of Tertiary source of pain) (Bilateral) (R>L); Abnormal MRI, lumbar spine (2015); Lumbar central spinal stenosis (Severe at L4-5; L5-S1); Lumbar lateral recess stenosis (Bilateral L>R at L4-5; Left sided at L5-S1); Lumbar foraminal stenosis (Left L5-S1); Lumbar facet hypertrophy (L4-5 & L5-S1 L>R); Abnormal MRI, cervical spine (2014); and Cervical foraminal stenosis (Right sided C3-4 & C4-5) (Left-sided C5-6 and C7-T1) (Bilateral C6-7) on his problem list.. His primarily concern today is the Back Pain (low) and Leg Pain (bilateral)   Pain Assessment: Self-Reported Pain Score: 5              Reported level is compatible with observation       Pain Type: Chronic pain Pain Location: Back Pain Orientation: Lower Pain Descriptors / Indicators: Radiating, Aching, Stabbing, Sharp Pain Frequency: Constant  The patient comes into the clinics today for pharmacological management of his chronic pain. I last saw this patient on 03/29/2016. The patient  reports that he does not use drugs. His body mass index is 25.5 kg/m.  Date of Last Visit: 01/21/16 Service Provided on Last Visit: Med Refill  Controlled Substance Pharmacotherapy Assessment & REMS (Risk Evaluation and Mitigation Strategy)  Analgesic: Oxycodone/APAP 2.5/325 one every 12 hours (5 mg/day of oxycodone) MME/day: 7.5 mg/day Pill Count: Bottle labeled oxycodone/apap 2.5/325 mg # 1/60  Filled 03-11-16 Pharmacokinetics: Onset of action (Liberation/Absorption): Within expected pharmacological parameters Time to Peak effect (Distribution): Timing  and results are as within normal expected parameters Duration of action (Metabolism/Excretion): Within normal limits for medication Pharmacodynamics: Analgesic Effect: More than 50% Activity Facilitation: Medication(s) allow patient to  sit, stand, walk, and do the basic ADLs Perceived Effectiveness: Described as relatively effective, allowing for increase in activities of daily living (ADL) Side-effects or Adverse reactions: None reported Monitoring: [place] PMP: Online review of the past 33-month period conducted. Compliant with practice rules and regulations Last UDS on record: Tox[employer] Select 13  Date Value Ref Range Status  01/21/2016 FINAL  Final    Comment:    ==================================================================== TOXASSURE SELECT 13 (MW) ==================================================================== Test                             Result       Flag       Units Drug Present and Declared for Prescription Verification   Oxycodone                      229          EXPECTED   ng/mg creat   Oxymorphone                    214          EXPECTED   ng/mg creat   Noroxycodone                   836          EXPECTED   ng/mg creat   Noroxymorphone                 110          EXPECTED   ng/mg creat    Sources of oxycodone are scheduled prescription medications.    Oxymorphone, noroxycodone, and noroxymorphone are expected    metabolites of oxycodone. Oxymorphone is also available as a    scheduled prescription medication. ==================================================================== Test                      Result    Flag   Units      Ref Range   Creatinine              70               mg/dL      >=16 ==================================================================== Declared Medications:  The flagging and interpretation on this report are based on the  following declared medications.  Unexpected results may arise from  inaccuracies in the declared  medications.  **Note: The testing scope of this panel includes these medications:  Oxycodone (Oxycodone Acetaminophen)  **Note: The testing scope of this panel does not include following  reported medications:  Acetaminophen (Oxycodone Acetaminophen)  Baclofen  Folic acid  Gabapentin  Lubiprostone  Omeprazole (Nexium)  Ranitidine ==================================================================== For clinical consultation, please call [phone]. ====================================================================    UDS interpretation: Compliant          Medication Assessment Form: Reviewed. Patient indicates being compliant with therapy Treatment compliance: Compliant Risk Assessment: Aberrant Behavior: None observed today Substance Use Disorder (SUD) Risk Level: Moderate Risk of opioid abuse or dependence: 0.7-3.0% with doses ? 36 MME/day and 6.1-26% with doses ? 120 MME/day. Opioid Risk Tool (ORT) Score: Total Score: 7 Moderate Risk for SUD (Score between 4-7) Depression Scale Score: PHQ-2: PHQ-2 Total Score: 0 No depression (0) PHQ-9: PHQ-9 Total Score: 0 No depression (0-4)  Pharmacologic Plan: No change in therapy, at this time  Laboratory Chemistry  Inflammation Markers Lab Results  Component Value Date   ESRSEDRATE 15 09/22/2015  CRP 0.5 09/22/2015    Renal Function Lab Results  Component Value Date   BUN 13 02/13/2016   CREATININE 0.80 02/13/2016   GFRAA >60 02/13/2016   GFRNONAA >60 02/13/2016    Hepatic Function Lab Results  Component Value Date   AST 25 02/13/2016   ALT 18 02/13/2016   ALBUMIN 4.6 02/13/2016    Electrolytes Lab Results  Component Value Date   NA 137 02/13/2016   K 4.1 02/13/2016   CL 102 02/13/2016   CALCIUM 8.9 02/13/2016   MG 1.9 09/22/2015    Pain Modulating Vitamins Lab Results  Component Value Date   VITAMINB12 594 08/08/2015    Coagulation Parameters Lab Results  Component Value Date   INR 1.10  02/13/2016   LABPROT 14.4 02/13/2016   APTT 33 08/08/2015   PLT 60 (L) 02/13/2016    Cardiovascular Lab Results  Component Value Date   HGB 14.4 02/13/2016   HCT 41.2 02/13/2016    Note: Lab results reviewed.  Recent Diagnostic Imaging  Ct Chest Lung Ca Screen Low Dose W/o Cm  Result Date: 01/20/2016 CLINICAL DATA:  60 year old male current smoker with 44 pack-year history of smoking. Lung cancer screening examination. EXAM: CT CHEST WITHOUT CONTRAST LOW-DOSE FOR LUNG CANCER SCREENING TECHNIQUE: Multidetector CT imaging of the chest was performed following the standard protocol without IV contrast. COMPARISON:  Low-dose lung cancer screening chest CT 08/07/2014. FINDINGS: Mediastinum/Nodes: Heart size is normal. There is no significant pericardial fluid, thickening or pericardial calcification. There is atherosclerosis of the thoracic aorta, the great vessels of the mediastinum and the coronary arteries, including calcified atherosclerotic plaque in the left anterior descending and right coronary arteries. Calcifications of the aortic valve. No pathologically enlarged mediastinal or hilar lymph nodes. Please note that accurate exclusion of hilar adenopathy is limited on noncontrast CT scans. Esophagus is unremarkable in appearance. No axillary lymphadenopathy. Lungs/Pleura: Tiny pulmonary nodule in the left upper lobe (image 128 of series 3) with a volume derived mean diameter of 2.8 mm, similar to the prior study. No other larger more suspicious appearing pulmonary nodules or masses are noted on today's examination. Mild diffuse bronchial wall thickening with very mild centrilobular and paraseptal emphysema most apparent the lung apices. No acute consolidative airspace disease. No pleural effusions. Upper abdomen: 1.3 cm low-attenuation lesion in segment 7 of the liver is incompletely characterized on today's noncontrast CT examination, but is similar to the prior study, likely a small cyst. Tiny  gallstones lie dependently in the gallbladder. Liver has a shrunken appearance and nodular contour, which may suggest underlying cirrhosis. Atherosclerosis. Musculoskeletal: There are no aggressive appearing lytic or blastic lesions noted in the visualized portions of the skeleton. IMPRESSION: 1. Lung-RADS Category 2S, benign appearance or behavior. Continue annual screening with low-dose chest CT without contrast in 12 months. 2. The "S" modifier above refers to potentially clinically significant non lung cancer related findings. Specifically, there is atherosclerosis, including 2 vessel coronary artery disease. Please note that although the presence of coronary artery calcium documents the presence of coronary artery disease, the severity of this disease and any potential stenosis cannot be assessed on this non-gated CT examination. Assessment for potential risk factor modification, dietary therapy or pharmacologic therapy may be warranted, if clinically indicated. 3. There are calcifications of the aortic valve. Echocardiographic correlation for evaluation of potential valvular dysfunction may be warranted if clinically indicated. 4. Cholelithiasis without evidence of acute cholecystitis at this time. 5. Morphologic changes in the liver suggestive of underlying  cirrhosis. Electronically Signed   By: Trudie Reed M.D.   On: 01/20/2016 15:35    Meds  The patient has a current medication list which includes the following prescription(s): baclofen, folic acid, gabapentin, omeprazole, oxycodone-acetaminophen, oxycodone-acetaminophen, oxycodone-acetaminophen, ranitidine, and magnesium oxide.  Current Outpatient Prescriptions on File Prior to Visit  Medication Sig  . folic acid (FOLVITE) 1 MG tablet Take 1 mg by mouth.  . OMEPRAZOLE PO Take by mouth.  . ranitidine (ZANTAC) 75 MG tablet Take 75 mg by mouth daily.   No current facility-administered medications on file prior to visit.     ROS   Constitutional: Denies any fever or chills Gastrointestinal: No reported hemesis, hematochezia, vomiting, or acute GI distress Musculoskeletal: Denies any acute onset joint swelling, redness, loss of ROM, or weakness Neurological: No reported episodes of acute onset apraxia, aphasia, dysarthria, agnosia, amnesia, paralysis, loss of coordination, or loss of consciousness  Allergies  Mr. Libert is allergic to codeine sulfate; morphine; and duloxetine.  PFSH  Medical:  Mr. Winey  has a past medical history of Anxiety; Back ache (05/06/2014); Cervical pain (05/06/2014); COPD (chronic obstructive pulmonary disease) (HCC); Depression; Dystonia; Extremity pain (05/06/2014); Febrile seizures (HCC); Foot pain (09/11/2014); Gout; Head injury (06/23/2015); Hemorrhoids; Hepatitis B; History of GI bleed; Hypertension; Leukopenia; Osteoarthritis of spine with radiculopathy, lumbar region (06/23/2015); Peripheral neuropathy (HCC) (06/23/2015); Personal history of tobacco use, presenting hazards to health (01/09/2016); Pulse irregularity; Seizures (HCC); Spinal stenosis; and Uncomplicated opioid dependence (HCC) (06/23/2015). Family: family history includes Cancer in his father; Cancer (age of onset: 26) in his brother; Heart disease in his father; Stroke in his father and mother. Surgical:  has a past surgical history that includes surgery for cervical neck fracture and Esophagogastroduodenoscopy (11/2013). Tobacco:  reports that he has been smoking Cigarettes.  He has a 35.00 pack-year smoking history. He has never used smokeless tobacco. Alcohol:  reports that he does not drink alcohol. Drug:  reports that he does not use drugs.  Constitutional Exam  Vitals: Blood pressure 127/70, pulse 66, temperature 98 F (36.7 C), resp. rate 18, height 6' (1.829 m), weight 188 lb (85.3 kg), SpO2 95 %. General appearance: Well nourished, well developed, and well hydrated. In no acute distress Calculated BMI/Body habitus: Body  mass index is 25.5 kg/m. (25-29.9 kg/m2) Overweight - 20% higher incidence of chronic pain Psych/Mental status: Alert and oriented x 3 (person, place, & time) Eyes: PERLA Respiratory: No evidence of acute respiratory distress  Cervical Spine Exam  Inspection: No masses, redness, or swelling Alignment: Symmetrical Functional ROM: ROM appears unrestricted Stability: No instability detected Muscle strength & Tone: Functionally intact Sensory: Unimpaired Palpation: Non-contributory  Upper Extremity (UE) Exam    Side: Right upper extremity  Side: Left upper extremity  Inspection: No masses, redness, swelling, or asymmetry  Inspection: No masses, redness, swelling, or asymmetry  Functional ROM: ROM appears unrestricted  Functional ROM: ROM appears unrestricted  Muscle strength & Tone: Functionally intact  Muscle strength & Tone: Functionally intact  Sensory: Unimpaired  Sensory: Unimpaired  Palpation: Non-contributory  Palpation: Non-contributory   Thoracic Spine Exam  Inspection: No masses, redness, or swelling Alignment: Symmetrical Functional ROM: ROM appears unrestricted Stability: No instability detected Sensory: Unimpaired Muscle strength & Tone: Functionally intact Palpation: Non-contributory  Lumbar Spine Exam  Inspection: No masses, redness, or swelling Alignment: Symmetrical Functional ROM: Decreased ROM Stability: No instability detected Muscle strength & Tone: Functionally intact Sensory: Movement-associated pain Palpation: Complains of area being tender to palpation Provocative Tests: Lumbar Hyperextension  and rotation test: Positive bilaterally for facet joint pain. Patrick's Maneuver: evaluation deferred today              Gait & Posture Assessment  Ambulation: Unassisted Gait: Relatively normal for age and body habitus Posture: WNL   Lower Extremity Exam    Side: Right lower extremity  Side: Left lower extremity  Inspection: No masses, redness, swelling,  or asymmetry  Inspection: No masses, redness, swelling, or asymmetry  Functional ROM: ROM appears unrestricted  Functional ROM: ROM appears unrestricted  Muscle strength & Tone: Functionally intact  Muscle strength & Tone: Functionally intact  Sensory: Unimpaired  Sensory: Unimpaired  Palpation: Non-contributory  Palpation: Non-contributory    Assessment & Plan  Primary Diagnosis & Pertinent Problem List: The primary encounter diagnosis was Chronic pain. Diagnoses of Long term current use of opiate analgesic, Opiate use (7.5 MME/Day), Encounter for chronic pain management, Chronic low back pain (Location of Primary Source of Pain) (Bilateral) (L>R), Chronic pain of lower extremity, unspecified laterality, Chronic lumbar radicular pain (Right S1 and Left L5) (Location of Secondary source of pain) (Bilateral) (L>R), Chronic neck pain (Location of Tertiary source of pain) (Bilateral) (R>L), Chronic upper extremity pain (Location of Tertiary source of pain) (Bilateral) (R>L), Lumbar facet syndrome (Location of Primary Source of Pain) (Bilateral) (L>R), Neurogenic pain, and Musculoskeletal pain were also pertinent to this visit.  Visit Diagnosis: 1. Chronic pain   2. Long term current use of opiate analgesic   3. Opiate use (7.5 MME/Day)   4. Encounter for chronic pain management   5. Chronic low back pain (Location of Primary Source of Pain) (Bilateral) (L>R)   6. Chronic pain of lower extremity, unspecified laterality   7. Chronic lumbar radicular pain (Right S1 and Left L5) (Location of Secondary source of pain) (Bilateral) (L>R)   8. Chronic neck pain (Location of Tertiary source of pain) (Bilateral) (R>L)   9. Chronic upper extremity pain (Location of Tertiary source of pain) (Bilateral) (R>L)   10. Lumbar facet syndrome (Location of Primary Source of Pain) (Bilateral) (L>R)   11. Neurogenic pain   12. Musculoskeletal pain     Problems updated and reviewed during this visit: No problems  updated.  Problem-specific Plan(s): No problem-specific Assessment & Plan notes found for this encounter.  No new Assessment & Plan notes have been filed under this hospital service since the last note was generated. Service: Pain Management   Plan of Care   Problem List Items Addressed This Visit      High   Chronic low back pain (Location of Primary Source of Pain) (Bilateral) (L>R) (Chronic)   Relevant Medications   oxycodone-acetaminophen (PERCOCET) 2.5-325 MG tablet   oxycodone-acetaminophen (PERCOCET) 2.5-325 MG tablet   oxycodone-acetaminophen (PERCOCET) 2.5-325 MG tablet   baclofen (LIORESAL) 10 MG tablet   Chronic lower extremity pain (Location of Secondary source of pain) (Bilateral) (L>R) (Chronic)   Chronic lumbar radicular pain (Right S1 and Left L5) (Location of Secondary source of pain) (Bilateral) (L>R) (Chronic)   Chronic neck pain (Location of Tertiary source of pain) (Bilateral) (R>L) (Chronic)   Relevant Medications   oxycodone-acetaminophen (PERCOCET) 2.5-325 MG tablet   oxycodone-acetaminophen (PERCOCET) 2.5-325 MG tablet   oxycodone-acetaminophen (PERCOCET) 2.5-325 MG tablet   gabapentin (NEURONTIN) 600 MG tablet   baclofen (LIORESAL) 10 MG tablet   Chronic pain - Primary (Chronic)   Relevant Medications   oxycodone-acetaminophen (PERCOCET) 2.5-325 MG tablet   oxycodone-acetaminophen (PERCOCET) 2.5-325 MG tablet   oxycodone-acetaminophen (PERCOCET) 2.5-325 MG  tablet   gabapentin (NEURONTIN) 600 MG tablet   baclofen (LIORESAL) 10 MG tablet   Chronic upper extremity pain (Location of Tertiary source of pain) (Bilateral) (R>L) (Chronic)   Relevant Medications   oxycodone-acetaminophen (PERCOCET) 2.5-325 MG tablet   oxycodone-acetaminophen (PERCOCET) 2.5-325 MG tablet   oxycodone-acetaminophen (PERCOCET) 2.5-325 MG tablet   gabapentin (NEURONTIN) 600 MG tablet   baclofen (LIORESAL) 10 MG tablet   Lumbar facet syndrome (Location of Primary Source of Pain)  (Bilateral) (L>R) (Chronic)   Relevant Medications   oxycodone-acetaminophen (PERCOCET) 2.5-325 MG tablet   oxycodone-acetaminophen (PERCOCET) 2.5-325 MG tablet   oxycodone-acetaminophen (PERCOCET) 2.5-325 MG tablet   baclofen (LIORESAL) 10 MG tablet   Musculoskeletal pain (Chronic)   Relevant Medications   baclofen (LIORESAL) 10 MG tablet   Magnesium Oxide 500 MG CAPS   Neurogenic pain (Chronic)   Relevant Medications   gabapentin (NEURONTIN) 600 MG tablet     Medium   Encounter for chronic pain management   Long term current use of opiate analgesic (Chronic)   Opiate use (7.5 MME/Day) (Chronic)    Other Visit Diagnoses   None.      Pharmacotherapy (Medications Ordered): Meds ordered this encounter  Medications  . oxycodone-acetaminophen (PERCOCET) 2.5-325 MG tablet    Sig: Take 1 tablet by mouth every 12 (twelve) hours.    Dispense:  60 tablet    Refill:  0    Do not add this medication to the electronic "Automatic Refill" notification system. Patient may have prescription filled one day early if pharmacy is closed on scheduled refill date. Do not fill until: 05/20/16 To last until: 06/19/16  . oxycodone-acetaminophen (PERCOCET) 2.5-325 MG tablet    Sig: Take 1 tablet by mouth every 12 (twelve) hours as needed for pain.    Dispense:  60 tablet    Refill:  0    Do not add this medication to the electronic "Automatic Refill" notification system. Patient may have prescription filled one day early if pharmacy is closed on scheduled refill date. Do not fill until: 04/20/16 To last until: 05/20/16  . oxycodone-acetaminophen (PERCOCET) 2.5-325 MG tablet    Sig: Take 1 tablet by mouth every 12 (twelve) hours as needed for pain.    Dispense:  60 tablet    Refill:  0    Do not add this medication to the electronic "Automatic Refill" notification system. Patient may have prescription filled one day early if pharmacy is closed on scheduled refill date. Do not fill until:  06/19/16 To last until: 07/19/16  . gabapentin (NEURONTIN) 600 MG tablet    Sig: Take 1 tablet (600 mg total) by mouth every 8 (eight) hours.    Dispense:  120 tablet    Refill:  2    Do not place this medication, or any other prescription from our practice, on "Automatic Refill". Patient may have prescription filled one day early if pharmacy is closed on scheduled refill date.  . baclofen (LIORESAL) 10 MG tablet    Sig: Take 1 tablet (10 mg total) by mouth 3 (three) times daily.    Dispense:  90 tablet    Refill:  2    Do not place this medication, or any other prescription from our practice, on "Automatic Refill". Patient may have prescription filled one day early if pharmacy is closed on scheduled refill date.  . Magnesium Oxide 500 MG CAPS    Sig: Take 1 capsule (500 mg total) by mouth 2 (two) times daily at 8  am and 10 pm.    Dispense:  100 capsule    Refill:  PRN    Do not place this medication, or any other prescription from our practice, on "Automatic Refill". Patient may have prescription filled one day early if pharmacy is closed on scheduled refill date.    Lab-work & Procedure Ordered: No orders of the defined types were placed in this encounter.   Imaging Ordered: None  Interventional Therapies: Scheduled:  None at this point.    Considering:   1. Diagnostic left L5-S1 lumbar epidural steroid injection under fluoroscopic guidance, with a without sedation.  2. Diagnostic bilateral L4-5 transforaminal epidural steroid injection under fluoroscopic guidance, with or without sedation.  3. Diagnostic bilateral lumbar facet block under fluoroscopic guidance and IV sedation.  4. Diagnostic right-sided cervical epidural steroid injection under fluoroscopic guidance with or without sedation.    PRN Procedures:   1. Diagnostic left L5-S1 lumbar epidural steroid injection under fluoroscopic guidance, with a without sedation.  2. Diagnostic bilateral L4-5 transforaminal  epidural steroid injection under fluoroscopic guidance, with or without sedation.  3. Diagnostic bilateral lumbar facet block under fluoroscopic guidance and IV sedation.  4. Diagnostic right-sided cervical epidural steroid injection under fluoroscopic guidance with or without sedation.    Referral(s) or Consult(s): None at this time.  New Prescriptions   MAGNESIUM OXIDE 500 MG CAPS    Take 1 capsule (500 mg total) by mouth 2 (two) times daily at 8 am and 10 pm.   OXYCODONE-ACETAMINOPHEN (PERCOCET) 2.5-325 MG TABLET    Take 1 tablet by mouth every 12 (twelve) hours as needed for pain.    Medications administered during this visit: Mr. Kasdorf had no medications administered during this visit.  Requested PM Follow-up: Return in 2 months (on 07/05/2016) for Med-Mgmt, In addition, (PRN) Procedure.  Future Appointments Date Time Provider Department Center  07/05/2016 9:40 AM Delano Metz, MD [facility] None  08/13/2016 11:15 AM [facility] None  08/13/2016 11:30 AM [facility] [place], MD [facility] None    Primary Care Physician: Corky Downs, MD Location: [facility] Note by: Sydnee Levans. Laban Emperor, M.D, DABA, DABAPM, DABPM, DABIPP, FIPP  Pain Score Disclaimer: We use the NRS-11 scale. This is a self-reported, subjective measurement of pain severity with only modest accuracy. It is used primarily to identify changes within a particular patient. It must be understood that outpatient pain scales are significantly less accurate that those used for research, where they can be applied under ideal controlled circumstances with minimal exposure to variables. In reality, the score is likely to be a combination of pain intensity and pain affect, where pain affect describes the degree of emotional arousal or changes in action readiness caused by the sensory experience of pain. Factors such as social and work situation, setting, emotional state, anxiety levels,  expectation, and prior pain experience may influence pain perception and show large inter-individual differences that may also be affected by time variables.  Patient instructions provided during this appointment: Patient Instructions   Smoking Cessation, Tips for Success If you are ready to quit smoking, congratulations! You have chosen to help yourself be healthier. Cigarettes bring nicotine, tar, carbon monoxide, and other irritants into your body. Your lungs, heart, and blood vessels will be able to work better without these poisons. There are many different ways to quit smoking. Nicotine gum, nicotine patches, a nicotine inhaler, or nicotine nasal spray can help with physical craving. Hypnosis, support groups, and medicines help break the habit  of smoking. WHAT THINGS CAN I DO TO MAKE QUITTING EASIER?  Here are some tips to help you quit for good:  Pick a date when you will quit smoking completely. Tell all of your friends and family about your plan to quit on that date.  Do not try to slowly cut down on the number of cigarettes you are smoking. Pick a quit date and quit smoking completely starting on that day.  Throw away all cigarettes.   Clean and remove all ashtrays from your home, work, and car.  On a card, write down your reasons for quitting. Carry the card with you and read it when you get the urge to smoke.  Cleanse your body of nicotine. Drink enough water and fluids to keep your urine clear or pale yellow. Do this after quitting to flush the nicotine from your body.  Learn to predict your moods. Do not let a bad situation be your excuse to have a cigarette. Some situations in your life might tempt you into wanting a cigarette.  Never have "just one" cigarette. It leads to wanting another and another. Remind yourself of your decision to quit.  Change habits associated with smoking. If you smoked while driving or when feeling stressed, try other activities to replace smoking.  Stand up when drinking your coffee. Brush your teeth after eating. Sit in a different chair when you read the paper. Avoid alcohol while trying to quit, and try to drink fewer caffeinated beverages. Alcohol and caffeine may urge you to smoke.  Avoid foods and drinks that can trigger a desire to smoke, such as sugary or spicy foods and alcohol.  Ask people who smoke not to smoke around you.  Have something planned to do right after eating or having a cup of coffee. For example, plan to take a walk or exercise.  Try a relaxation exercise to calm you down and decrease your stress. Remember, you may be tense and nervous for the first 2 weeks after you quit, but this will pass.  Find new activities to keep your hands busy. Play with a pen, coin, or rubber band. Doodle or draw things on paper.  Brush your teeth right after eating. This will help cut down on the craving for the taste of tobacco after meals. You can also try mouthwash.   Use oral substitutes in place of cigarettes. Try using lemon drops, carrots, cinnamon sticks, or chewing gum. Keep them handy so they are available when you have the urge to smoke.  When you have the urge to smoke, try deep breathing.  Designate your home as a nonsmoking area.  If you are a heavy smoker, ask your health care provider about a prescription for nicotine chewing gum. It can ease your withdrawal from nicotine.  Reward yourself. Set aside the cigarette money you save and buy yourself something nice.  Look for support from others. Join a support group or smoking cessation program. Ask someone at home or at work to help you with your plan to quit smoking.  Always ask yourself, "Do I need this cigarette or is this just a reflex?" Tell yourself, "Today, I choose not to smoke," or "I do not want to smoke." You are reminding yourself of your decision to quit.  Do not replace cigarette smoking with electronic cigarettes (commonly called e-cigarettes). The  safety of e-cigarettes is unknown, and some may contain harmful chemicals.  If you relapse, do not give up! Plan ahead and think about what  you will do the next time you get the urge to smoke. HOW WILL I FEEL WHEN I QUIT SMOKING? You may have symptoms of withdrawal because your body is used to nicotine (the addictive substance in cigarettes). You may crave cigarettes, be irritable, feel very hungry, cough often, get headaches, or have difficulty concentrating. The withdrawal symptoms are only temporary. They are strongest when you first quit but will go away within 10-14 days. When withdrawal symptoms occur, stay in control. Think about your reasons for quitting. Remind yourself that these are signs that your body is healing and getting used to being without cigarettes. Remember that withdrawal symptoms are easier to treat than the major diseases that smoking can cause.  Even after the withdrawal is over, expect periodic urges to smoke. However, these cravings are generally short lived and will go away whether you smoke or not. Do not smoke! WHAT RESOURCES ARE AVAILABLE TO HELP ME QUIT SMOKING? Your health care provider can direct you to community resources or hospitals for support, which may include:  Group support.  Education.  Hypnosis.  Therapy.   This information is not intended to replace advice given to you by your health care provider. Make sure you discuss any questions you have with your health care provider.   Document Released: 05/21/2004 Document Revised: 09/13/2014 Document Reviewed: 02/08/2013 Elsevier Interactive Patient Education 2016 [employer]. Drug Holidays  Definitions Tolerance:  Defined as the progressively decreased responsiveness to a drug.  Occurs when the drug is used repeatedly and the body adapts to the continued presence of the drug.  As a result, a larger dose of the drug is needed to achieve the effect originally obtained by a smaller dose.  It is thought to  be due to the formation of excess opioid receptors.  Drug Holiday:   Is when a patient stops taking a medication(s) for a period of time; anywhere from a few days to several weeks.  Withdrawals:   Refers to the wide range of symptoms that occur after stopping or dramatically reducing opiate drugs after heavy or prolonged use.  Withdrawal symptoms do not occur to patients that use low dose opioids, or those who take the medication sporadically.  Contrary to benzodiazepine (example: Valium, Xanax, etc.) or alcohol withdrawals ("Delirium Tremens"), opioid withdrawals are not lethal.  Withdrawals are the physical manifestation of the body getting rid of the excess receptors.  Purpose To eliminate tolerance.  Duration of Holiday 14 consecutive days. (2 weeks)  Expected Symptoms Early symptoms of withdrawal include:   Agitation  Anxiety  Muscle Aches   Increased tearing  Insomnia  Runny Nose   Sweating  Yawning     Late symptoms of withdrawal include:   Abdominal cramping  Diarrhea  Dilated pupils   Goose bumps  Nausea  Vomiting   Opioid withdrawal reactions are very uncomfortable but are not life-threatening.  Symptoms usually start within 12 hours of last opioid dose and within 30 hours of last methadone exposure.  Duration of Symptoms 48-72 hours of short acting medications and 2 to 4 days for methadone.  Treatment  Clonidine (CatepresT) or tizanidine (ZanaflexT) for agitation, sweating, tearing, runny nose.  Promethazine (PhenerganT) for nausea, vomiting.  NSAIDs for pain.  Benefits 1. Improved effectiveness of opioids. 2. Decreased opioid dose needed to achieve benefits. 3. Improved pain with lesser dose.  You were given 3 prescriptions for Oxycodone today. You were given information about Drug Holiday and Pre-emptive analgesia.Drug Holidays  Definitions Tolerance:  Defined  as the progressively decreased responsiveness to a drug.  Occurs when the drug is  used repeatedly and the body adapts to the continued presence of the drug.  As a result, a larger dose of the drug is needed to achieve the effect originally obtained by a smaller dose.  It is thought to be due to the formation of excess opioid receptors.  Drug Holiday:   Is when a patient stops taking a medication(s) for a period of time; anywhere from a few days to several weeks.  Withdrawals:   Refers to the wide range of symptoms that occur after stopping or dramatically reducing opiate drugs after heavy or prolonged use.  Withdrawal symptoms do not occur to patients that use low dose opioids, or those who take the medication sporadically.  Contrary to benzodiazepine (example: Valium, Xanax, etc.) or alcohol withdrawals ("Delirium Tremens"), opioid withdrawals are not lethal.  Withdrawals are the physical manifestation of the body getting rid of the excess receptors.  Purpose To eliminate tolerance.  Duration of Holiday 14 consecutive days. (2 weeks)  Expected Symptoms Early symptoms of withdrawal include:  Agitation Anxiety Muscle Aches  Increased tearing Insomnia Runny Nose  Sweating Yawning     Late symptoms of withdrawal include:  Abdominal cramping Diarrhea Dilated pupils  Goose bumps Nausea Vomiting   Opioid withdrawal reactions are very uncomfortable but are not life-threatening.  Symptoms usually start within 12 hours of last opioid dose and within 30 hours of last methadone exposure.  Duration of Symptoms 48-72 hours of short acting medications and 2 to 4 days for methadone.  Treatment Clonidine (CatepresT) or tizanidine (ZanaflexT) for agitation, sweating, tearing, runny nose. Promethazine (PhenerganT) for nausea, vomiting. NSAIDs for pain.  Benefits Improved effectiveness of opioids. Decreased opioid dose needed to achieve benefits. Improved pain with lesser dose.

## 2016-04-21 NOTE — Patient Instructions (Addendum)
Smoking Cessation, Tips for Success If you are ready to quit smoking, congratulations! You have chosen to help yourself be healthier. Cigarettes bring nicotine, tar, carbon monoxide, and other irritants into your body. Your lungs, heart, and blood vessels will be able to work better without these poisons. There are many different ways to quit smoking. Nicotine gum, nicotine patches, a nicotine inhaler, or nicotine nasal spray can help with physical craving. Hypnosis, support groups, and medicines help break the habit of smoking. WHAT THINGS CAN I DO TO MAKE QUITTING EASIER?  Here are some tips to help you quit for good:  Pick a date when you will quit smoking completely. Tell all of your friends and family about your plan to quit on that date.  Do not try to slowly cut down on the number of cigarettes you are smoking. Pick a quit date and quit smoking completely starting on that day.  Throw away all cigarettes.   Clean and remove all ashtrays from your home, work, and car.  On a card, write down your reasons for quitting. Carry the card with you and read it when you get the urge to smoke.  Cleanse your body of nicotine. Drink enough water and fluids to keep your urine clear or pale yellow. Do this after quitting to flush the nicotine from your body.  Learn to predict your moods. Do not let a bad situation be your excuse to have a cigarette. Some situations in your life might tempt you into wanting a cigarette.  Never have "just one" cigarette. It leads to wanting another and another. Remind yourself of your decision to quit.  Change habits associated with smoking. If you smoked while driving or when feeling stressed, try other activities to replace smoking. Stand up when drinking your coffee. Brush your teeth after eating. Sit in a different chair when you read the paper. Avoid alcohol while trying to quit, and try to drink fewer caffeinated beverages. Alcohol and caffeine may urge you to  smoke.  Avoid foods and drinks that can trigger a desire to smoke, such as sugary or spicy foods and alcohol.  Ask people who smoke not to smoke around you.  Have something planned to do right after eating or having a cup of coffee. For example, plan to take a walk or exercise.  Try a relaxation exercise to calm you down and decrease your stress. Remember, you may be tense and nervous for the first 2 weeks after you quit, but this will pass.  Find new activities to keep your hands busy. Play with a pen, coin, or rubber band. Doodle or draw things on paper.  Brush your teeth right after eating. This will help cut down on the craving for the taste of tobacco after meals. You can also try mouthwash.   Use oral substitutes in place of cigarettes. Try using lemon drops, carrots, cinnamon sticks, or chewing gum. Keep them handy so they are available when you have the urge to smoke.  When you have the urge to smoke, try deep breathing.  Designate your home as a nonsmoking area.  If you are a heavy smoker, ask your health care provider about a prescription for nicotine chewing gum. It can ease your withdrawal from nicotine.  Reward yourself. Set aside the cigarette money you save and buy yourself something nice.  Look for support from others. Join a support group or smoking cessation program. Ask someone at home or at work to help you with your plan  to quit smoking.  Always ask yourself, "Do I need this cigarette or is this just a reflex?" Tell yourself, "Today, I choose not to smoke," or "I do not want to smoke." You are reminding yourself of your decision to quit.  Do not replace cigarette smoking with electronic cigarettes (commonly called e-cigarettes). The safety of e-cigarettes is unknown, and some may contain harmful chemicals.  If you relapse, do not give up! Plan ahead and think about what you will do the next time you get the urge to smoke. HOW WILL I FEEL WHEN I QUIT SMOKING? You  may have symptoms of withdrawal because your body is used to nicotine (the addictive substance in cigarettes). You may crave cigarettes, be irritable, feel very hungry, cough often, get headaches, or have difficulty concentrating. The withdrawal symptoms are only temporary. They are strongest when you first quit but will go away within 10-14 days. When withdrawal symptoms occur, stay in control. Think about your reasons for quitting. Remind yourself that these are signs that your body is healing and getting used to being without cigarettes. Remember that withdrawal symptoms are easier to treat than the major diseases that smoking can cause.  Even after the withdrawal is over, expect periodic urges to smoke. However, these cravings are generally short lived and will go away whether you smoke or not. Do not smoke! WHAT RESOURCES ARE AVAILABLE TO HELP ME QUIT SMOKING? Your health care provider can direct you to community resources or hospitals for support, which may include:  Group support.  Education.  Hypnosis.  Therapy.   This information is not intended to replace advice given to you by your health care provider. Make sure you discuss any questions you have with your health care provider.   Document Released: 05/21/2004 Document Revised: 09/13/2014 Document Reviewed: 02/08/2013 Elsevier Interactive Patient Education 2016 [place]  Definitions Tolerance:  Defined as the progressively decreased responsiveness to a drug.  Occurs when the drug is used repeatedly and the body adapts to the continued presence of the drug.  As a result, a larger dose of the drug is needed to achieve the effect originally obtained by a smaller dose.  It is thought to be due to the formation of excess opioid receptors.  Drug Holiday:   Is when a patient stops taking a medication(s) for a period of time; anywhere from a few days to several weeks.  Withdrawals:   Refers to the wide range of symptoms  that occur after stopping or dramatically reducing opiate drugs after heavy or prolonged use.  Withdrawal symptoms do not occur to patients that use low dose opioids, or those who take the medication sporadically.  Contrary to benzodiazepine (example: Valium, Xanax, etc.) or alcohol withdrawals ("Delirium Tremens"), opioid withdrawals are not lethal.  Withdrawals are the physical manifestation of the body getting rid of the excess receptors.  Purpose To eliminate tolerance.  Duration of Holiday 14 consecutive days. (2 weeks)  Expected Symptoms Early symptoms of withdrawal include:   Agitation  Anxiety  Muscle Aches   Increased tearing  Insomnia  Runny Nose   Sweating  Yawning     Late symptoms of withdrawal include:   Abdominal cramping  Diarrhea  Dilated pupils   Goose bumps  Nausea  Vomiting   Opioid withdrawal reactions are very uncomfortable but are not life-threatening.  Symptoms usually start within 12 hours of last opioid dose and within 30 hours of last methadone exposure.  Duration of Symptoms 48-72 hours  of short acting medications and 2 to 4 days for methadone.  Treatment  Clonidine (Catepres) or tizanidine (Zanaflex) for agitation, sweating, tearing, runny nose.  Promethazine (Phenergan) for nausea, vomiting.  NSAIDs for pain.  Benefits 1. Improved effectiveness of opioids. 2. Decreased opioid dose needed to achieve benefits. 3. Improved pain with lesser dose.  You were given 3 prescriptions for Oxycodone today. You were given information about Drug Holiday and Pre-emptive analgesia.Drug Holidays  Definitions Tolerance:  Defined as the progressively decreased responsiveness to a drug.  Occurs when the drug is used repeatedly and the body adapts to the continued presence of the drug.  As a result, a larger dose of the drug is needed to achieve the effect originally obtained by a smaller dose.  It is thought to be due to the formation of excess  opioid receptors.  Drug Holiday:   Is when a patient stops taking a medication(s) for a period of time; anywhere from a few days to several weeks.  Withdrawals:   Refers to the wide range of symptoms that occur after stopping or dramatically reducing opiate drugs after heavy or prolonged use.  Withdrawal symptoms do not occur to patients that use low dose opioids, or those who take the medication sporadically.  Contrary to benzodiazepine (example: Valium, Xanax, etc.) or alcohol withdrawals ("Delirium Tremens"), opioid withdrawals are not lethal.  Withdrawals are the physical manifestation of the body getting rid of the excess receptors.  Purpose To eliminate tolerance.  Duration of Holiday 14 consecutive days. (2 weeks)  Expected Symptoms Early symptoms of withdrawal include:  Agitation Anxiety Muscle Aches  Increased tearing Insomnia Runny Nose  Sweating Yawning     Late symptoms of withdrawal include:  Abdominal cramping Diarrhea Dilated pupils  Goose bumps Nausea Vomiting   Opioid withdrawal reactions are very uncomfortable but are not life-threatening.  Symptoms usually start within 12 hours of last opioid dose and within 30 hours of last methadone exposure.  Duration of Symptoms 48-72 hours of short acting medications and 2 to 4 days for methadone.  Treatment Clonidine (Catepres) or tizanidine (Zanaflex) for agitation, sweating, tearing, runny nose. Promethazine (Phenergan) for nausea, vomiting. NSAIDs for pain.  Benefits Improved effectiveness of opioids. Decreased opioid dose needed to achieve benefits. Improved pain with lesser dose.

## 2016-05-23 ENCOUNTER — Encounter: Payer: Self-pay | Admitting: Hematology and Oncology

## 2016-06-25 ENCOUNTER — Other Ambulatory Visit: Payer: Self-pay | Admitting: Pain Medicine

## 2016-06-25 DIAGNOSIS — M792 Neuralgia and neuritis, unspecified: Secondary | ICD-10-CM

## 2016-07-04 NOTE — Telephone Encounter (Signed)
Note: It is the responsibility of all patients to have an up to date list of all the medications that need to be refilled at the time of their appointment.  Pain Management Medication Policy: Medication changes and refills will be conducted during appointments only, after determining if the medical indications for the use of the medication are still present. No fax, phone, or electronic refills will be conducted outside of an evaluation appointment. Patients are encouraged to call and set up an appointment to comply with stipulated policy.

## 2016-07-05 ENCOUNTER — Ambulatory Visit: Payer: Medicare PPO | Attending: Pain Medicine | Admitting: Pain Medicine

## 2016-07-05 ENCOUNTER — Encounter: Payer: Self-pay | Admitting: Pain Medicine

## 2016-07-05 VITALS — BP 103/51 | HR 58 | Temp 98.0°F | Resp 16 | Ht 72.0 in | Wt 185.0 lb

## 2016-07-05 DIAGNOSIS — M792 Neuralgia and neuritis, unspecified: Secondary | ICD-10-CM

## 2016-07-05 DIAGNOSIS — J449 Chronic obstructive pulmonary disease, unspecified: Secondary | ICD-10-CM | POA: Insufficient documentation

## 2016-07-05 DIAGNOSIS — G894 Chronic pain syndrome: Secondary | ICD-10-CM | POA: Insufficient documentation

## 2016-07-05 DIAGNOSIS — K219 Gastro-esophageal reflux disease without esophagitis: Secondary | ICD-10-CM | POA: Insufficient documentation

## 2016-07-05 DIAGNOSIS — M25511 Pain in right shoulder: Secondary | ICD-10-CM | POA: Insufficient documentation

## 2016-07-05 DIAGNOSIS — I10 Essential (primary) hypertension: Secondary | ICD-10-CM | POA: Insufficient documentation

## 2016-07-05 DIAGNOSIS — R5600 Simple febrile convulsions: Secondary | ICD-10-CM | POA: Diagnosis not present

## 2016-07-05 DIAGNOSIS — M47899 Other spondylosis, site unspecified: Secondary | ICD-10-CM | POA: Diagnosis not present

## 2016-07-05 DIAGNOSIS — G5600 Carpal tunnel syndrome, unspecified upper limb: Secondary | ICD-10-CM | POA: Insufficient documentation

## 2016-07-05 DIAGNOSIS — F329 Major depressive disorder, single episode, unspecified: Secondary | ICD-10-CM | POA: Insufficient documentation

## 2016-07-05 DIAGNOSIS — K5909 Other constipation: Secondary | ICD-10-CM | POA: Diagnosis not present

## 2016-07-05 DIAGNOSIS — K860 Alcohol-induced chronic pancreatitis: Secondary | ICD-10-CM | POA: Diagnosis not present

## 2016-07-05 DIAGNOSIS — M7918 Myalgia, other site: Secondary | ICD-10-CM

## 2016-07-05 DIAGNOSIS — F17210 Nicotine dependence, cigarettes, uncomplicated: Secondary | ICD-10-CM | POA: Diagnosis not present

## 2016-07-05 DIAGNOSIS — K8020 Calculus of gallbladder without cholecystitis without obstruction: Secondary | ICD-10-CM | POA: Diagnosis not present

## 2016-07-05 DIAGNOSIS — M48061 Spinal stenosis, lumbar region without neurogenic claudication: Secondary | ICD-10-CM | POA: Insufficient documentation

## 2016-07-05 DIAGNOSIS — G621 Alcoholic polyneuropathy: Secondary | ICD-10-CM | POA: Diagnosis not present

## 2016-07-05 DIAGNOSIS — M5412 Radiculopathy, cervical region: Secondary | ICD-10-CM | POA: Insufficient documentation

## 2016-07-05 DIAGNOSIS — G4733 Obstructive sleep apnea (adult) (pediatric): Secondary | ICD-10-CM | POA: Insufficient documentation

## 2016-07-05 DIAGNOSIS — S0990XA Unspecified injury of head, initial encounter: Secondary | ICD-10-CM | POA: Diagnosis not present

## 2016-07-05 DIAGNOSIS — M545 Low back pain, unspecified: Secondary | ICD-10-CM

## 2016-07-05 DIAGNOSIS — M109 Gout, unspecified: Secondary | ICD-10-CM | POA: Insufficient documentation

## 2016-07-05 DIAGNOSIS — F419 Anxiety disorder, unspecified: Secondary | ICD-10-CM | POA: Insufficient documentation

## 2016-07-05 DIAGNOSIS — G40909 Epilepsy, unspecified, not intractable, without status epilepticus: Secondary | ICD-10-CM | POA: Diagnosis not present

## 2016-07-05 DIAGNOSIS — G8929 Other chronic pain: Secondary | ICD-10-CM

## 2016-07-05 DIAGNOSIS — T402X5A Adverse effect of other opioids, initial encounter: Secondary | ICD-10-CM | POA: Diagnosis not present

## 2016-07-05 DIAGNOSIS — F1920 Other psychoactive substance dependence, uncomplicated: Secondary | ICD-10-CM | POA: Diagnosis not present

## 2016-07-05 MED ORDER — GABAPENTIN 600 MG PO TABS: 600.0000 mg | ORAL_TABLET | Freq: Three times a day (TID) | ORAL | 2 refills | Status: DC

## 2016-07-05 MED ORDER — MAGNESIUM OXIDE -MG SUPPLEMENT 500 MG PO CAPS: 1.0000 | ORAL_CAPSULE | Freq: Two times a day (BID) | ORAL | 2 refills | Status: DC

## 2016-07-05 MED ORDER — BACLOFEN 10 MG PO TABS: 10.0000 mg | ORAL_TABLET | Freq: Three times a day (TID) | ORAL | 2 refills | Status: DC

## 2016-07-05 MED ORDER — OXYCODONE HCL 5 MG PO TABS: 2.5000 mg | ORAL_TABLET | Freq: Two times a day (BID) | ORAL | 0 refills | Status: DC

## 2016-07-05 MED ORDER — OXYCODONE HCL 5 MG PO TABS
2.5000 mg | ORAL_TABLET | Freq: Two times a day (BID) | ORAL | 0 refills | Status: DC
Start: 2016-08-18 — End: 2016-10-05

## 2016-07-05 NOTE — Patient Instructions (Signed)

## 2016-07-05 NOTE — Progress Notes (Signed)
Nursing Pain Medication Assessment:  Safety precautions to be maintained throughout the outpatient stay will include: orient to surroundings, keep bed in low position, maintain call bell within reach at all times, provide assistance with transfer out of bed and ambulation.  Medication Inspection Compliance: Pill count conducted under aseptic conditions, in front of the patient. Neither the pills nor the bottle was removed from the patient's sight at any time. Once count was completed pills were immediately returned to the patient in their original bottle. Pill Count: 2.5 of 60 pills remain Bottle Appearance: Standard pharmacy container. Clearly labeled. Medication: See above Filled Date: 09 / 28 / 2017

## 2016-07-05 NOTE — Progress Notes (Signed)
Patient's Name: John [name]  MRN: [id number]  Referring Provider: Cletis Athens, MD  DOB: April 12, 1956  PCP: Cletis Athens, MD  DOS: 07/05/2016  Note by: Kathlen Brunswick. Dossie Arbour, MD  Service setting: Ambulatory outpatient  Specialty: Interventional Pain Management  Location: [facility] (AMB) Pain Management Facility    Patient type: Established   Primary Reason(s) for Visit: Encounter for prescription drug management (Level of risk: moderate) CC: Back Pain (lower); Neck Pain; and Shoulder Pain (right)  HPI  John [name] is a 60 y.o. year old, male patient, who comes today for an initial evaluation. He has Encounter for therapeutic drug level monitoring; Long term current use of opiate analgesic; Opiate use (7.5 MME/Day); Lumbar spondylosis; Chronic low back pain (Location of Primary Source of Pain) (Bilateral) (L>R); IVP/radiological dye Allergy; Thrombocytopenia ([place]); History of alcoholism; Carpal tunnel syndrome; Clinical depression; Difficulty in walking; Dystonia; Gastric ulcer; HCV (hepatitis C virus); H/O neoplasm; BP (high blood pressure); Decreased leukocytes; Hepatic cirrhosis ([place]); Loss of feeling or sensation; Absence of sensation; Current tobacco use; Has a tremor; Osteoarthritis of spine with radiculopathy, lumbosacral region; Peripheral neuropathy ([place]); Chronic neck pain (Location of Tertiary source of pain) (Bilateral) (R>L); Cervical spondylosis; Obstructive sleep apnea; History of panic attacks; GERD (gastroesophageal reflux disease); Chronic constipation; 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[place]); Leukopenia; Lumbar facet syndrome (Location of Primary Source of Pain) (Bilateral) (L>R); Chronic lumbar radicular pain (Right S1 and Left L5) (Location of Secondary source of pain) (Bilateral) (L>R); Cervical radicular pain (Location of Tertiary source of pain) (Bilateral) (R>L); Chronic obstructive pulmonary disease (COPD) ([place]); Head injury; Epileptic disorder ([place]); Chronic pain; Long term  prescription opiate use; Encounter for chronic pain management; Radicular pain of shoulder (Bilateral) (R>L); Opioid-induced constipation ([place]); Personal history of tobacco use, presenting hazards to health; Neurogenic pain; Musculoskeletal pain; Chronic lower extremity pain (Location of Secondary source of pain) (Bilateral) (L>R); Chronic upper extremity pain (Location of Tertiary source of pain) (Bilateral) (R>L); Abnormal MRI, lumbar spine (2015); Lumbar central spinal stenosis (Severe at L4-5; L5-S1); Lumbar lateral recess stenosis (Bilateral L>R at L4-5; Left sided at L5-S1); Lumbar foraminal stenosis (Left L5-S1); Lumbar facet hypertrophy (L4-5 & L5-S1 L>R); Abnormal MRI, cervical spine (2014); and Cervical foraminal stenosis (Right sided C3-4 & C4-5) (Left-sided C5-6 and C7-T1) (Bilateral C6-7) on his problem list.. His primarily concern today is the Back Pain (lower); Neck Pain; and Shoulder Pain (right)  Pain Assessment: Self-Reported Pain Score: 5  (last pain medicine this a.m.)/10 Clinically the patient looks like a 2/10 Reported level is inconsistent with clinical observations. Information on the proper use of the pain score provided to the patient today. Pain Type: Chronic pain Pain Location: Back (shoulder and neck "pinched nerve") Pain Orientation: Lower, Left, Right (neck and shoulders on the right) Pain Descriptors / Indicators: Aching, Sharp, Penetrating (numbness and tingling down the right arm ) Pain Frequency: Constant  John [name] was last seen on 06/25/2016 for medication management. During today's appointment we reviewed John [name] chronic pain status, as well as his outpatient medication regimen.  The patient  reports that he does not use drugs. His body mass index is 25.09 kg/m.  Further details on both, my assessment(s), as well as the proposed treatment plan, please see below.  Controlled Substance Pharmacotherapy Assessment REMS (Risk Evaluation and Mitigation  Strategy)  Analgesic:Oxycodone 2.5 one every 12 hours (5 mg/day of oxycodone) MME/day:7.5 mg/day Patterson,Delores G, RN  07/05/2016 10:04 AM  Sign at close encounter Nursing Pain Medication Assessment:  Safety precautions to be maintained throughout the  outpatient stay will include: orient to surroundings, keep bed in low position, maintain call bell within reach at all times, provide assistance with transfer out of bed and ambulation.  Medication Inspection Compliance: Pill count conducted under aseptic conditions, in front of the patient. Neither the pills nor the bottle was removed from the patient's sight at any time. Once count was completed pills were immediately returned to the patient in their original bottle. Pill Count: 2.5 of 60 pills remain Bottle Appearance: Standard pharmacy container. Clearly labeled. Medication: See above Filled Date: 09 / 28 / 2017   Pharmacokinetics: Liberation and absorption (onset of action): WNL Distribution (time to peak effect): WNL Metabolism and excretion (duration of action): WNL         Pharmacodynamics: Desired effects: Analgesia: The patient reports >50% benefit. Reported improvement in function: The patient reports medication allows him to accomplish basic ADLs. Clinically meaningful improvement in function (CMIF): Sustained CMIF goals met Perceived effectiveness: Described as relatively effective, allowing for increase in activities of daily living (ADL) Undesirable effects: Side-effects or Adverse reactions: None reported Monitoring: [place] PMP: Online review of the past 30-monthperiod conducted. Compliant with practice rules and regulations List of all UDS test(s) done:  Lab Results  Component Value Date   TOXASSSELUR FINAL 01/21/2016   TOXASSSELUR FINAL 11/18/2015   TOXASSSELUR FINAL 09/22/2015   Last UDS on record: ToxAssure Select 13  Date Value Ref Range Status  01/21/2016 FINAL  Final    Comment:     ==================================================================== TOXASSURE SELECT 13 (MW) ==================================================================== Test                             Result       Flag       Units Drug Present and Declared for Prescription Verification   Oxycodone                      229          EXPECTED   ng/mg creat   Oxymorphone                    214          EXPECTED   ng/mg creat   Noroxycodone                   836          EXPECTED   ng/mg creat   Noroxymorphone                 110          EXPECTED   ng/mg creat    Sources of oxycodone are scheduled prescription medications.    Oxymorphone, noroxycodone, and noroxymorphone are expected    metabolites of oxycodone. Oxymorphone is also available as a    scheduled prescription medication. ==================================================================== Test                      Result    Flag   Units      Ref Range   Creatinine              70               mg/dL      >=20 ==================================================================== Declared Medications:  The flagging and interpretation on this report are based on the  following declared medications.  Unexpected results may arise from  inaccuracies in the declared medications.  **Note: The testing scope of this panel includes these medications:  Oxycodone (Oxycodone Acetaminophen)  **Note: The testing scope of this panel does not include following  reported medications:  Acetaminophen (Oxycodone Acetaminophen)  Baclofen  Folic acid  Gabapentin  Lubiprostone  Omeprazole (Nexium)  Ranitidine ==================================================================== For clinical consultation, please call [phone]. ====================================================================    UDS interpretation: Compliant          Medication Assessment Form: Reviewed. Patient indicates being compliant with therapy Treatment compliance:  Compliant Risk Assessment Profile: Aberrant behavior: See prior evaluations. None observed or detected today Comorbid factors increasing risk of overdose: See prior notes. No additional risks detected today Risk of substance use disorder (SUD): Low Opioid Risk Tool (ORT) Total Score: 6  Interpretation Table:  Score <3 = Low Risk for SUD  Score between 4-7 = Moderate Risk for SUD  Score >8 = High Risk for Opioid Abuse   Risk Mitigation Strategies:  Patient Counseling: Covered Patient-Prescriber Agreement (PPA): Present and active  Notification to other healthcare providers: Done  Pharmacologic Plan: No change in therapy, at this time  Laboratory Chemistry  Inflammation Markers Lab Results  Component Value Date   ESRSEDRATE 15 09/22/2015   CRP 0.5 09/22/2015   Renal Function Lab Results  Component Value Date   BUN 13 02/13/2016   CREATININE 0.80 02/13/2016   GFRAA >60 02/13/2016   GFRNONAA >60 02/13/2016   Hepatic Function Lab Results  Component Value Date   AST 25 02/13/2016   ALT 18 02/13/2016   ALBUMIN 4.6 02/13/2016   Electrolytes Lab Results  Component Value Date   NA 137 02/13/2016   K 4.1 02/13/2016   CL 102 02/13/2016   CALCIUM 8.9 02/13/2016   MG 1.9 09/22/2015   Pain Modulating Vitamins Lab Results  Component Value Date   VITAMINB12 594 08/08/2015   Coagulation Parameters Lab Results  Component Value Date   INR 1.10 02/13/2016   LABPROT 14.4 02/13/2016   APTT 33 08/08/2015   PLT 60 (L) 02/13/2016   Cardiovascular Lab Results  Component Value Date   HGB 14.4 02/13/2016   HCT 41.2 02/13/2016   Note: Lab results reviewed.  Recent Diagnostic Imaging Review  Ct Chest Lung Ca Screen Low Dose W/o Cm  Result Date: 01/20/2016 CLINICAL DATA:  60 year old male current smoker with 44 pack-year history of smoking. Lung cancer screening examination. EXAM: CT CHEST WITHOUT CONTRAST LOW-DOSE FOR LUNG CANCER SCREENING TECHNIQUE: Multidetector CT  imaging of the chest was performed following the standard protocol without IV contrast. COMPARISON:  Low-dose lung cancer screening chest CT 08/07/2014. FINDINGS: Mediastinum/Nodes: Heart size is normal. There is no significant pericardial fluid, thickening or pericardial calcification. There is atherosclerosis of the thoracic aorta, the great vessels of the mediastinum and the coronary arteries, including calcified atherosclerotic plaque in the left anterior descending and right coronary arteries. Calcifications of the aortic valve. No pathologically enlarged mediastinal or hilar lymph nodes. Please note that accurate exclusion of hilar adenopathy is limited on noncontrast CT scans. Esophagus is unremarkable in appearance. No axillary lymphadenopathy. Lungs/Pleura: Tiny pulmonary nodule in the left upper lobe (image 128 of series 3) with a volume derived mean diameter of 2.8 mm, similar to the prior study. No other larger more suspicious appearing pulmonary nodules or masses are noted on today's examination. Mild diffuse bronchial wall thickening with very mild centrilobular and paraseptal emphysema most apparent the lung apices. No acute consolidative airspace disease. No pleural effusions. Upper abdomen: 1.3  cm low-attenuation lesion in segment 7 of the liver is incompletely characterized on today's noncontrast CT examination, but is similar to the prior study, likely a small cyst. Tiny gallstones lie dependently in the gallbladder. Liver has a shrunken appearance and nodular contour, which may suggest underlying cirrhosis. Atherosclerosis. Musculoskeletal: There are no aggressive appearing lytic or blastic lesions noted in the visualized portions of the skeleton. IMPRESSION: 1. Lung-RADS Category 2S, benign appearance or behavior. Continue annual screening with low-dose chest CT without contrast in 12 months. 2. The "S" modifier above refers to potentially clinically significant non lung cancer related findings.  Specifically, there is atherosclerosis, including 2 vessel coronary artery disease. Please note that although the presence of coronary artery calcium documents the presence of coronary artery disease, the severity of this disease and any potential stenosis cannot be assessed on this non-gated CT examination. Assessment for potential risk factor modification, dietary therapy or pharmacologic therapy may be warranted, if clinically indicated. 3. There are calcifications of the aortic valve. Echocardiographic correlation for evaluation of potential valvular dysfunction may be warranted if clinically indicated. 4. Cholelithiasis without evidence of acute cholecystitis at this time. 5. Morphologic changes in the liver suggestive of underlying cirrhosis. Electronically Signed   By: Vinnie Langton M.D.   On: 01/20/2016 15:35   Note: Imaging results reviewed.  Meds  The patient has a current medication list which includes the following prescription(s): alprazolam, baclofen, folic acid, gabapentin, magnesium oxide, oxycodone, oxycodone, and oxycodone.  Current Outpatient Prescriptions on File Prior to Visit  Medication Sig  . folic acid (FOLVITE) 1 MG tablet Take 1 mg by mouth daily.    No current facility-administered medications on file prior to visit.    ROS  Constitutional: Denies any fever or chills Gastrointestinal: No reported hemesis, hematochezia, vomiting, or acute GI distress Musculoskeletal: Denies any acute onset joint swelling, redness, loss of ROM, or weakness Neurological: No reported episodes of acute onset apraxia, aphasia, dysarthria, agnosia, amnesia, paralysis, loss of coordination, or loss of consciousness  Allergies  John [name] is allergic to codeine sulfate; morphine; and duloxetine.  [place]  Drug: John [name]  reports that he does not use drugs. Alcohol:  reports that he does not drink alcohol. Tobacco:  reports that he has been smoking Cigarettes.  He has a 17.50 pack-year  smoking history. He has never used smokeless tobacco. Medical:  has a past medical history of Anxiety; Back ache (05/06/2014); Cervical pain (05/06/2014); COPD (chronic obstructive pulmonary disease) ([place]); Depression; Dystonia; Extremity pain (05/06/2014); Febrile seizures ([place]); Foot pain (09/11/2014); Gout; Head injury (06/23/2015); Hemorrhoids; Hepatitis B; History of GI bleed; Hypertension; Leukopenia; Osteoarthritis of spine with radiculopathy, lumbar region (06/23/2015); Peripheral neuropathy ([place]) (06/23/2015); Personal history of tobacco use, presenting hazards to health (01/09/2016); Pulse irregularity; Seizures ([place]); Spinal stenosis; and Uncomplicated opioid dependence ([place]) (06/23/2015). Family: family history includes Cancer in his father; Cancer (age of onset: 32) in his brother; Heart disease in his father; Stroke in his father and mother.  Past Surgical History:  Procedure Laterality Date  . ESOPHAGOGASTRODUODENOSCOPY  11/2013  . surgery for cervical neck fracture     Constitutional Exam  General appearance: Well nourished, well developed, and well hydrated. In no apparent acute distress Vitals:   07/05/16 0958  BP: (!) 103/51  Pulse: (!) 58  Resp: 16  Temp: 98 F (36.7 C)  TempSrc: Oral  SpO2: 97%  Weight: 185 lb (83.9 kg)  Height: 6' (1.829 m)   BMI Assessment: Estimated body mass index is  25.09 kg/m as calculated from the following:   Height as of this encounter: 6' (1.829 m).   Weight as of this encounter: 185 lb (83.9 kg).  BMI interpretation table: BMI level Category Range association with higher incidence of chronic pain  <18 kg/m2 Underweight   18.5-24.9 kg/m2 Ideal body weight   25-29.9 kg/m2 Overweight Increased incidence by 20%  30-34.9 kg/m2 Obese (Class I) Increased incidence by 68%  35-39.9 kg/m2 Severe obesity (Class II) Increased incidence by 136%  >40 kg/m2 Extreme obesity (Class III) Increased incidence by 254%   BMI Readings from Last 4 Encounters:   07/05/16 25.09 kg/m  04/21/16 25.50 kg/m  02/13/16 27.34 kg/m  01/21/16 27.67 kg/m   Wt Readings from Last 4 Encounters:  07/05/16 185 lb (83.9 kg)  04/21/16 188 lb (85.3 kg)  02/13/16 201 lb 9.8 oz (91.4 kg)  01/21/16 204 lb (92.5 kg)  Psych/Mental status: Alert, oriented x 3 (person, place, & time) Eyes: PERLA Respiratory: No evidence of acute respiratory distress  Cervical Spine Exam  Inspection: No masses, redness, or swelling Alignment: Symmetrical Functional ROM: Unrestricted ROM Stability: No instability detected Muscle strength & Tone: Functionally intact Sensory: Unimpaired Palpation: Non-contributory  Upper Extremity (UE) Exam    Side: Right upper extremity  Side: Left upper extremity  Inspection: No masses, redness, swelling, or asymmetry  Inspection: No masses, redness, swelling, or asymmetry  Functional ROM: Unrestricted ROM         Functional ROM: Unrestricted ROM          Muscle strength & Tone: Functionally intact  Muscle strength & Tone: Functionally intact  Sensory: Unimpaired  Sensory: Unimpaired  Palpation: Non-contributory  Palpation: Non-contributory   Thoracic Spine Exam  Inspection: No masses, redness, or swelling Alignment: Symmetrical Functional ROM: Unrestricted ROM Stability: No instability detected Sensory: Unimpaired Muscle strength & Tone: Functionally intact Palpation: Non-contributory  Lumbar Spine Exam  Inspection: No masses, redness, or swelling Alignment: Symmetrical Functional ROM: Unrestricted ROM Stability: No instability detected Muscle strength & Tone: Functionally intact Sensory: Unimpaired Palpation: Non-contributory Provocative Tests: Lumbar Hyperextension and rotation test: evaluation deferred today       Patrick's Maneuver: evaluation deferred today              Gait & Posture Assessment  Ambulation: Unassisted Gait: Relatively normal for age and body habitus Posture: WNL   Lower Extremity Exam    Side:  Right lower extremity  Side: Left lower extremity  Inspection: No masses, redness, swelling, or asymmetry  Inspection: No masses, redness, swelling, or asymmetry  Functional ROM: Unrestricted ROM          Functional ROM: Unrestricted ROM          Muscle strength & Tone: Functionally intact  Muscle strength & Tone: Functionally intact  Sensory: Unimpaired  Sensory: Unimpaired  Palpation: Non-contributory  Palpation: Non-contributory   Assessment  Primary Diagnosis & Pertinent Problem List: The primary encounter diagnosis was Chronic pain syndrome. Diagnoses of Chronic low back pain without sciatica, unspecified back pain laterality, Musculoskeletal pain, and Neurogenic pain were also pertinent to this visit.  Visit Diagnosis: 1. Chronic pain syndrome   2. Chronic low back pain without sciatica, unspecified back pain laterality   3. Musculoskeletal pain   4. Neurogenic pain    Plan of Care  Pharmacotherapy (Medications Ordered): Meds ordered this encounter  Medications  . oxyCODONE (OXY IR/ROXICODONE) 5 MG immediate release tablet    Sig: Take 0.5 tablets (2.5 mg total) by mouth 2 (two)  times daily.    Dispense:  30 tablet    Refill:  0    Do not place this medication, or any other prescription from our practice, on "Automatic Refill". Patient may have prescription filled one day early if pharmacy is closed on scheduled refill date. Do not fill until: 07/19/16 To last until: 08/18/16  . oxyCODONE (OXY IR/ROXICODONE) 5 MG immediate release tablet    Sig: Take 0.5 tablets (2.5 mg total) by mouth 2 (two) times daily.    Dispense:  30 tablet    Refill:  0    Do not place this medication, or any other prescription from our practice, on "Automatic Refill". Patient may have prescription filled one day early if pharmacy is closed on scheduled refill date. Do not fill until: 08/18/16 To last until: 09/17/16  . oxyCODONE (OXY IR/ROXICODONE) 5 MG immediate release tablet    Sig: Take 0.5  tablets (2.5 mg total) by mouth 2 (two) times daily.    Dispense:  30 tablet    Refill:  0    Do not place this medication, or any other prescription from our practice, on "Automatic Refill". Patient may have prescription filled one day early if pharmacy is closed on scheduled refill date. Do not fill until: 09/17/16 To last until: 10/17/16  . Magnesium Oxide 500 MG CAPS    Sig: Take 1 capsule (500 mg total) by mouth 2 (two) times daily at 8 am and 10 pm.    Dispense:  60 capsule    Refill:  2    Do not place this medication, or any other prescription from our practice, on "Automatic Refill". Patient may have prescription filled one day early if pharmacy is closed on scheduled refill date.  . baclofen (LIORESAL) 10 MG tablet    Sig: Take 1 tablet (10 mg total) by mouth 3 (three) times daily.    Dispense:  90 tablet    Refill:  2    Do not place this medication, or any other prescription from our practice, on "Automatic Refill". Patient may have prescription filled one day early if pharmacy is closed on scheduled refill date.  . gabapentin (NEURONTIN) 600 MG tablet    Sig: Take 1 tablet (600 mg total) by mouth every 8 (eight) hours.    Dispense:  90 tablet    Refill:  2    Do not place this medication, or any other prescription from our practice, on "Automatic Refill". Patient may have prescription filled one day early if pharmacy is closed on scheduled refill date.   New Prescriptions   OXYCODONE (OXY IR/ROXICODONE) 5 MG IMMEDIATE RELEASE TABLET    Take 0.5 tablets (2.5 mg total) by mouth 2 (two) times daily.   OXYCODONE (OXY IR/ROXICODONE) 5 MG IMMEDIATE RELEASE TABLET    Take 0.5 tablets (2.5 mg total) by mouth 2 (two) times daily.   OXYCODONE (OXY IR/ROXICODONE) 5 MG IMMEDIATE RELEASE TABLET    Take 0.5 tablets (2.5 mg total) by mouth 2 (two) times daily.   Medications administered during this visit: John [name] had no medications administered during this visit. Lab-work, Procedure(s),  & Referral(s) Ordered: No orders of the defined types were placed in this encounter.  Imaging & Referral(s) Ordered: None  Interventional Therapies: Scheduled:None at this point.    Considering: 1. Diagnostic left L5-S1 lumbar epidural steroid injection under fluoroscopic guidance, with a without sedation.  2. Diagnostic bilateral L4-5 transforaminal epidural steroid injection under fluoroscopic guidance, with or without sedation.  3. Diagnostic bilateral lumbar facet block under fluoroscopic guidance and IV sedation.  4. Diagnostic right-sided cervical epidural steroid injection under fluoroscopic guidance with or without sedation.    PRN Procedures: 1. Diagnostic left L5-S1 lumbar epidural steroid injection under fluoroscopic guidance, with a without sedation.  2. Diagnostic bilateral L4-5 transforaminal epidural steroid injection under fluoroscopic guidance, with or without sedation.  3. Diagnostic bilateral lumbar facet block under fluoroscopic guidance and IV sedation.  4. Diagnostic right-sided cervical epidural steroid injection under fluoroscopic guidance with or without sedation.    Requested PM Follow-up: No Follow-up on file.  Future Appointments Date Time Provider [place]  08/13/2016 11:15 AM [facility] None  08/13/2016 11:30 AM Lequita Asal, MD [facility] None  10/05/2016 9:30 AM Milinda Pointer, MD [facility] None   Primary Care Physician: Cletis Athens, MD Location: [facility] Note by: Kathlen Brunswick. Dossie Arbour, M.D, DABA, DABAPM, DABPM, DABIPP, FIPP  Pain Score Disclaimer: We use the NRS-11 scale. This is a self-reported, subjective measurement of pain severity with only modest accuracy. It is used primarily to identify changes within a particular patient. It must be understood that outpatient pain scales are significantly less accurate that those used for research, where they can be applied under ideal  controlled circumstances with minimal exposure to variables. In reality, the score is likely to be a combination of pain intensity and pain affect, where pain affect describes the degree of emotional arousal or changes in action readiness caused by the sensory experience of pain. Factors such as social and work situation, setting, emotional state, anxiety levels, expectation, and prior pain experience may influence pain perception and show large inter-individual differences that may also be affected by time variables.  Patient instructions provided during this appointment: Patient Instructions  Pain Management Discharge Instructions  General Discharge Instructions :  If you need to reach your doctor call: Monday-Friday 8:00 am - 4:00 pm at [phone] or toll free [phone].  After clinic hours [phone] to have operator reach doctor.  Bring all of your medication bottles to all your appointments in the pain clinic.  To cancel or reschedule your appointment with Pain Management please remember to call 24 hours in advance to avoid a fee.  Refer to the educational materials which you have been given on: General Risks, I had my Procedure. Discharge Instructions, Post Sedation.  Post Procedure Instructions:  The drugs you were given will stay in your system until tomorrow, so for the next 24 hours you should not drive, make any legal decisions or drink any alcoholic beverages.  You may eat anything you prefer, but it is better to start with liquids then soups and crackers, and gradually work up to solid foods.  Please notify your doctor immediately if you have any unusual bleeding, trouble breathing or pain that is not related to your normal pain.  Depending on the type of procedure that was done, some parts of your body may feel week and/or numb.  This usually clears up by tonight or the next day.  Walk with the use of an assistive device or accompanied by an adult for the 24  hours.  You may use ice on the affected area for the first 24 hours.  Put ice in a Ziploc bag and cover with a towel and place against area 15 minutes on 15 minutes off.  You may switch to heat after 24 hours.

## 2016-08-13 ENCOUNTER — Other Ambulatory Visit: Payer: Self-pay

## 2016-08-13 ENCOUNTER — Ambulatory Visit: Payer: Self-pay | Admitting: Hematology and Oncology

## 2016-09-13 ENCOUNTER — Other Ambulatory Visit: Payer: Self-pay | Admitting: *Deleted

## 2016-09-13 DIAGNOSIS — D696 Thrombocytopenia, unspecified: Secondary | ICD-10-CM

## 2016-09-14 ENCOUNTER — Inpatient Hospital Stay: Payer: Medicare PPO | Attending: Hematology and Oncology | Admitting: Hematology and Oncology

## 2016-09-14 ENCOUNTER — Inpatient Hospital Stay: Payer: Medicare PPO

## 2016-09-14 VITALS — BP 121/71 | HR 60 | Temp 96.4°F | Resp 18 | Wt 186.3 lb

## 2016-09-14 DIAGNOSIS — F329 Major depressive disorder, single episode, unspecified: Secondary | ICD-10-CM | POA: Diagnosis not present

## 2016-09-14 DIAGNOSIS — B1910 Unspecified viral hepatitis B without hepatic coma: Secondary | ICD-10-CM

## 2016-09-14 DIAGNOSIS — K7460 Unspecified cirrhosis of liver: Secondary | ICD-10-CM

## 2016-09-14 DIAGNOSIS — F419 Anxiety disorder, unspecified: Secondary | ICD-10-CM | POA: Insufficient documentation

## 2016-09-14 DIAGNOSIS — I10 Essential (primary) hypertension: Secondary | ICD-10-CM | POA: Insufficient documentation

## 2016-09-14 DIAGNOSIS — M109 Gout, unspecified: Secondary | ICD-10-CM | POA: Insufficient documentation

## 2016-09-14 DIAGNOSIS — J449 Chronic obstructive pulmonary disease, unspecified: Secondary | ICD-10-CM | POA: Insufficient documentation

## 2016-09-14 DIAGNOSIS — Z79899 Other long term (current) drug therapy: Secondary | ICD-10-CM | POA: Diagnosis not present

## 2016-09-14 DIAGNOSIS — G629 Polyneuropathy, unspecified: Secondary | ICD-10-CM | POA: Insufficient documentation

## 2016-09-14 DIAGNOSIS — D696 Thrombocytopenia, unspecified: Secondary | ICD-10-CM

## 2016-09-14 DIAGNOSIS — F17210 Nicotine dependence, cigarettes, uncomplicated: Secondary | ICD-10-CM | POA: Insufficient documentation

## 2016-09-14 DIAGNOSIS — Z23 Encounter for immunization: Secondary | ICD-10-CM | POA: Diagnosis not present

## 2016-09-14 DIAGNOSIS — R161 Splenomegaly, not elsewhere classified: Secondary | ICD-10-CM

## 2016-09-14 LAB — COMPREHENSIVE METABOLIC PANEL
ALT: 13 U/L — ABNORMAL LOW (ref 17–63)
AST: 20 U/L (ref 15–41)
Albumin: 4.4 g/dL (ref 3.5–5.0)
Alkaline Phosphatase: 64 U/L (ref 38–126)
Anion gap: 5 (ref 5–15)
BUN: 14 mg/dL (ref 6–20)
CO2: 29 mmol/L (ref 22–32)
Calcium: 8.9 mg/dL (ref 8.9–10.3)
Chloride: 102 mmol/L (ref 101–111)
Creatinine, Ser: 0.62 mg/dL (ref 0.61–1.24)
GFR calc Af Amer: >60 mL/min (ref >60)
GFR calc non Af Amer: >60 mL/min (ref >60)
Glucose, Bld: 108 mg/dL — ABNORMAL HIGH (ref 65–99)
Potassium: 4 mmol/L (ref 3.5–5.1)
Sodium: 136 mmol/L (ref 135–145)
Total Bilirubin: 0.8 mg/dL (ref 0.3–1.2)
Total Protein: 7.7 g/dL (ref 6.5–8.1)

## 2016-09-14 LAB — CBC WITH DIFFERENTIAL/PLATELET
Basophils Absolute: 0 10*3/uL (ref 0–0.1)
Basophils Relative: 0 %
Eosinophils Absolute: 0.1 10*3/uL (ref 0–0.7)
Eosinophils Relative: 3 %
HCT: 41.1 % (ref 40.0–52.0)
Hemoglobin: 14 g/dL (ref 13.0–18.0)
Lymphocytes Relative: 23 %
Lymphs Abs: 0.9 10*3/uL — ABNORMAL LOW (ref 1.0–3.6)
MCH: 32.4 pg (ref 26.0–34.0)
MCHC: 34.1 g/dL (ref 32.0–36.0)
MCV: 95 fL (ref 80.0–100.0)
Monocytes Absolute: 0.2 10*3/uL (ref 0.2–1.0)
Monocytes Relative: 6 %
Neutro Abs: 2.5 10*3/uL (ref 1.4–6.5)
Neutrophils Relative %: 68 %
Platelets: 69 10*3/uL — ABNORMAL LOW (ref 150–440)
RBC: 4.32 MIL/uL — ABNORMAL LOW (ref 4.40–5.90)
RDW: 14 % (ref 11.5–14.5)
WBC: 3.8 10*3/uL (ref 3.8–10.6)

## 2016-09-14 LAB — PROTIME-INR
INR: 1.08
Prothrombin Time: 14 seconds (ref 11.4–15.2)

## 2016-09-14 MED ORDER — INFLUENZA VAC SPLIT QUAD 0.5 ML IM SUSY
0.5000 mL | PREFILLED_SYRINGE | Freq: Once | INTRAMUSCULAR | Status: AC
  Administered 2016-09-14: 0.5 mL via INTRAMUSCULAR
  Filled 2016-09-14: qty 0.5
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
## 2016-09-14 NOTE — Progress Notes (Signed)
Patient offers no complaints today. 

## 2016-09-15 LAB — AFP TUMOR MARKER: AFP-Tumor Marker: 3.1 ng/mL (ref 0.0–8.3)

## 2016-09-17 ENCOUNTER — Ambulatory Visit
Admission: RE | Admit: 2016-09-17 | Discharge: 2016-09-17 | Disposition: A | Discharge status: Home / Self Care | Payer: Medicare PPO | Source: Ambulatory Visit | Attending: Hematology and Oncology | Admitting: Hematology and Oncology

## 2016-09-17 DIAGNOSIS — K7460 Unspecified cirrhosis of liver: Secondary | ICD-10-CM | POA: Insufficient documentation

## 2016-09-17 DIAGNOSIS — R161 Splenomegaly, not elsewhere classified: Secondary | ICD-10-CM | POA: Diagnosis present

## 2016-09-17 DIAGNOSIS — K8020 Calculus of gallbladder without cholecystitis without obstruction: Secondary | ICD-10-CM | POA: Insufficient documentation

## 2016-09-17 DIAGNOSIS — Z23 Encounter for immunization: Secondary | ICD-10-CM

## 2016-09-17 DIAGNOSIS — D696 Thrombocytopenia, unspecified: Secondary | ICD-10-CM

## 2016-09-28 HISTORY — PX: TOOTH EXTRACTION: SHX859

## 2016-10-05 ENCOUNTER — Encounter: Payer: Self-pay | Admitting: Pain Medicine

## 2016-10-05 ENCOUNTER — Ambulatory Visit: Payer: Medicare PPO | Attending: Pain Medicine | Admitting: Pain Medicine

## 2016-10-05 VITALS — BP 112/74 | HR 55 | Temp 96.5°F | Resp 16 | Ht 72.0 in | Wt 180.0 lb

## 2016-10-05 DIAGNOSIS — M4802 Spinal stenosis, cervical region: Secondary | ICD-10-CM | POA: Insufficient documentation

## 2016-10-05 DIAGNOSIS — G8929 Other chronic pain: Secondary | ICD-10-CM

## 2016-10-05 DIAGNOSIS — M542 Cervicalgia: Secondary | ICD-10-CM | POA: Diagnosis not present

## 2016-10-05 DIAGNOSIS — K219 Gastro-esophageal reflux disease without esophagitis: Secondary | ICD-10-CM | POA: Insufficient documentation

## 2016-10-05 DIAGNOSIS — J449 Chronic obstructive pulmonary disease, unspecified: Secondary | ICD-10-CM | POA: Diagnosis not present

## 2016-10-05 DIAGNOSIS — S0990XA Unspecified injury of head, initial encounter: Secondary | ICD-10-CM | POA: Insufficient documentation

## 2016-10-05 DIAGNOSIS — M7918 Myalgia, other site: Secondary | ICD-10-CM

## 2016-10-05 DIAGNOSIS — K5909 Other constipation: Secondary | ICD-10-CM | POA: Insufficient documentation

## 2016-10-05 DIAGNOSIS — Z79891 Long term (current) use of opiate analgesic: Secondary | ICD-10-CM | POA: Diagnosis not present

## 2016-10-05 DIAGNOSIS — M5442 Lumbago with sciatica, left side: Secondary | ICD-10-CM

## 2016-10-05 DIAGNOSIS — G894 Chronic pain syndrome: Secondary | ICD-10-CM | POA: Diagnosis not present

## 2016-10-05 DIAGNOSIS — R262 Difficulty in walking, not elsewhere classified: Secondary | ICD-10-CM | POA: Diagnosis not present

## 2016-10-05 DIAGNOSIS — F1190 Opioid use, unspecified, uncomplicated: Secondary | ICD-10-CM

## 2016-10-05 DIAGNOSIS — D696 Thrombocytopenia, unspecified: Secondary | ICD-10-CM | POA: Insufficient documentation

## 2016-10-05 DIAGNOSIS — M79605 Pain in left leg: Secondary | ICD-10-CM

## 2016-10-05 DIAGNOSIS — M79604 Pain in right leg: Secondary | ICD-10-CM | POA: Insufficient documentation

## 2016-10-05 DIAGNOSIS — M791 Myalgia: Secondary | ICD-10-CM | POA: Diagnosis not present

## 2016-10-05 DIAGNOSIS — G621 Alcoholic polyneuropathy: Secondary | ICD-10-CM | POA: Diagnosis not present

## 2016-10-05 DIAGNOSIS — F329 Major depressive disorder, single episode, unspecified: Secondary | ICD-10-CM | POA: Insufficient documentation

## 2016-10-05 DIAGNOSIS — K860 Alcohol-induced chronic pancreatitis: Secondary | ICD-10-CM | POA: Diagnosis not present

## 2016-10-05 DIAGNOSIS — M5412 Radiculopathy, cervical region: Secondary | ICD-10-CM | POA: Diagnosis not present

## 2016-10-05 DIAGNOSIS — M545 Low back pain: Secondary | ICD-10-CM | POA: Insufficient documentation

## 2016-10-05 DIAGNOSIS — G40909 Epilepsy, unspecified, not intractable, without status epilepticus: Secondary | ICD-10-CM | POA: Diagnosis not present

## 2016-10-05 DIAGNOSIS — M5441 Lumbago with sciatica, right side: Secondary | ICD-10-CM

## 2016-10-05 DIAGNOSIS — M47816 Spondylosis without myelopathy or radiculopathy, lumbar region: Secondary | ICD-10-CM

## 2016-10-05 DIAGNOSIS — G4733 Obstructive sleep apnea (adult) (pediatric): Secondary | ICD-10-CM | POA: Diagnosis not present

## 2016-10-05 DIAGNOSIS — G5600 Carpal tunnel syndrome, unspecified upper limb: Secondary | ICD-10-CM | POA: Insufficient documentation

## 2016-10-05 DIAGNOSIS — M48061 Spinal stenosis, lumbar region without neurogenic claudication: Secondary | ICD-10-CM | POA: Diagnosis not present

## 2016-10-05 DIAGNOSIS — M1288 Other specific arthropathies, not elsewhere classified, other specified site: Secondary | ICD-10-CM

## 2016-10-05 DIAGNOSIS — M792 Neuralgia and neuritis, unspecified: Secondary | ICD-10-CM

## 2016-10-05 MED ORDER — OXYCODONE HCL 5 MG PO TABS: 2.5000 mg | ORAL_TABLET | Freq: Two times a day (BID) | ORAL | 0 refills | Status: DC

## 2016-10-05 MED ORDER — BACLOFEN 10 MG PO TABS: 10.0000 mg | ORAL_TABLET | Freq: Three times a day (TID) | ORAL | 0 refills | Status: DC

## 2016-10-05 MED ORDER — GABAPENTIN 600 MG PO TABS: 600.0000 mg | ORAL_TABLET | Freq: Three times a day (TID) | ORAL | 0 refills | Status: DC

## 2016-10-05 MED ORDER — MAGNESIUM OXIDE -MG SUPPLEMENT 500 MG PO CAPS: 1.0000 | ORAL_CAPSULE | Freq: Two times a day (BID) | ORAL | 0 refills | Status: DC

## 2016-10-05 NOTE — Progress Notes (Signed)
Nursing Pain Medication Assessment:  Safety precautions to be maintained throughout the outpatient stay will include: orient to surroundings, keep bed in low position, maintain call bell within reach at all times, provide assistance with transfer out of bed and ambulation.  Medication Inspection Compliance: Pill count conducted under aseptic conditions, in front of the patient. Neither the pills nor the bottle was removed from the patient's sight at any time. Once count was completed pills were immediately returned to the patient in their original bottle.  Medication: Oxycodone IR Pill/Patch Count: 13.5 of 30 pills remain Bottle Appearance: Standard pharmacy container. Clearly labeled. Filled Date: 01 / 13 / 2018 Last Medication intake:  Today

## 2016-10-05 NOTE — Progress Notes (Signed)
Patient's Name: John [name]  MRN: [id number]  Referring Provider: Cletis Athens, MD  DOB: Mar 17, 1956  PCP: Cletis Athens, MD  DOS: 10/05/2016  Note by: Kathlen Brunswick. Dossie Arbour, MD  Service setting: Ambulatory outpatient  Specialty: Interventional Pain Management  Location: [facility] (AMB) Pain Management Facility    Patient type: Established   Primary Reason(s) for Visit: Encounter for prescription drug management (Level of risk: moderate) CC: Back Pain (lower)  HPI  John [name] is a 61 y.o. year old, male patient, who comes today for a medication management evaluation. He has Encounter for therapeutic drug level monitoring; Long term current use of opiate analgesic; Opiate use (7.5 MME/Day); Lumbar spondylosis; Chronic low back pain (Location of Primary Source of Pain) (Bilateral) (L>R); IVP/radiological dye Allergy; Thrombocytopenia ([place]); History of alcoholism; Carpal tunnel syndrome; Clinical depression; Difficulty in walking; Dystonia; Gastric ulcer; HCV (hepatitis C virus); H/O neoplasm; BP (high blood pressure); Decreased leukocytes; Hepatic cirrhosis ([place]); Loss of feeling or sensation; Absence of sensation; Current tobacco use; Has a tremor; Osteoarthritis of spine with radiculopathy, lumbosacral region; Peripheral neuropathy (Alcoholic); Chronic neck pain (Location of Tertiary source of pain) (Bilateral) (R>L); Cervical spondylosis; Obstructive sleep apnea; History of panic attacks; GERD (gastroesophageal reflux disease); Chronic constipation; 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[place]); Leukopenia; Lumbar facet syndrome (Location of Primary Source of Pain) (Bilateral) (L>R); Chronic lumbar radicular pain (Right S1 and Left L5) (Location of Secondary source of pain) (Bilateral) (L>R); Cervical radicular pain (Location of Tertiary source of pain) (Bilateral) (R>L); Chronic obstructive pulmonary disease (COPD) ([place]); Head injury; Epileptic disorder ([place]); Long term prescription opiate use; Encounter for chronic  pain management; Radicular pain of shoulder (Bilateral) (R>L); Opioid-induced constipation (OIC); Personal history of tobacco use, presenting hazards to health; Neurogenic pain; Musculoskeletal pain; Chronic lower extremity pain (Location of Secondary source of pain) (Bilateral) (L>R); Chronic upper extremity pain (Location of Tertiary source of pain) (Bilateral) (R>L); Abnormal MRI, lumbar spine (2015); Lumbar central spinal stenosis (Severe at L4-5; L5-S1); Lumbar lateral recess stenosis (Bilateral L>R at L4-5; Left sided at L5-S1); Lumbar foraminal stenosis (Left L5-S1); Lumbar facet hypertrophy (L4-5 & L5-S1 L>R); Abnormal MRI, cervical spine (2014); Cervical foraminal stenosis (Right sided C3-4 & C4-5) (Left-sided C5-6 and C7-T1) (Bilateral C6-7); Splenomegaly; and Chronic pain syndrome on his problem list. His primarily concern today is the Back Pain (lower)  Pain Assessment: Self-Reported Pain Score: 6 /10 Clinically the patient looks like a 2/10 Reported level is inconsistent with clinical observations. Information on the proper use of the pain scale provided to the patient today Pain Type: Chronic pain Pain Location: Back Pain Orientation: Lower Pain Descriptors / Indicators: Aching, Constant, Radiating, Shooting, Sharp (back of thigh is a sharp pain) Pain Frequency: Constant  John [name] was last seen on 07/05/2016 for medication management. During today's appointment we reviewed John [name] chronic pain status, as well as his outpatient medication regimen.  The patient  reports that he does not use drugs. His body mass index is 24.41 kg/m.  Further details on both, my assessment(s), as well as the proposed treatment plan, please see below.  Controlled Substance Pharmacotherapy Assessment REMS (Risk Evaluation and Mitigation Strategy)  Analgesic:Oxycodone 2.5 one every 12 hours (5 mg/day of oxycodone) MME/day:7.5 mg/day Evon Slack, RN  10/05/2016  9:51 AM  Sign at close  encounter Nursing Pain Medication Assessment:  Safety precautions to be maintained throughout the outpatient stay will include: orient to surroundings, keep bed in low position, maintain call bell within reach at all times, provide assistance with transfer  out of bed and ambulation.  Medication Inspection Compliance: Pill count conducted under aseptic conditions, in front of the patient. Neither the pills nor the bottle was removed from the patient's sight at any time. Once count was completed pills were immediately returned to the patient in their original bottle.  Medication: Oxycodone IR Pill/Patch Count: 13.5 of 30 pills remain Bottle Appearance: Standard pharmacy container. Clearly labeled. Filled Date: 01 / 13 / 2018 Last Medication intake:  Today   Pharmacokinetics: Liberation and absorption (onset of action): WNL Distribution (time to peak effect): WNL Metabolism and excretion (duration of action): WNL         Pharmacodynamics: Desired effects: Analgesia: John [name] reports >50% benefit. Functional ability: Patient reports that medication allows him to accomplish basic ADLs Clinically meaningful improvement in function (CMIF): Sustained CMIF goals met Perceived effectiveness: Described as relatively effective, allowing for increase in activities of daily living (ADL) Undesirable effects: Side-effects or Adverse reactions: None reported Monitoring: [place] PMP: Online review of the past 35-monthperiod conducted. Compliant with practice rules and regulations List of all UDS test(s) done:  Lab Results  Component Value Date   TOXASSSELUR FINAL 01/21/2016   TOXASSSELUR FINAL 11/18/2015   TOXASSSELUR FINAL 09/22/2015   Last UDS on record: ToxAssure Select 13  Date Value Ref Range Status  01/21/2016 FINAL  Final    Comment:    ==================================================================== TOXASSURE SELECT 13  (MW) ==================================================================== Test                             Result       Flag       Units Drug Present and Declared for Prescription Verification   Oxycodone                      229          EXPECTED   ng/mg creat   Oxymorphone                    214          EXPECTED   ng/mg creat   Noroxycodone                   836          EXPECTED   ng/mg creat   Noroxymorphone                 110          EXPECTED   ng/mg creat    Sources of oxycodone are scheduled prescription medications.    Oxymorphone, noroxycodone, and noroxymorphone are expected    metabolites of oxycodone. Oxymorphone is also available as a    scheduled prescription medication. ==================================================================== Test                      Result    Flag   Units      Ref Range   Creatinine              70               mg/dL      >=20 ==================================================================== Declared Medications:  The flagging and interpretation on this report are based on the  following declared medications.  Unexpected results may arise from  inaccuracies in the declared medications.  **Note: The testing scope of this panel includes these medications:  Oxycodone (Oxycodone Acetaminophen)  **  Note: The testing scope of this panel does not include following  reported medications:  Acetaminophen (Oxycodone Acetaminophen)  Baclofen  Folic acid  Gabapentin  Lubiprostone  Omeprazole (Nexium)  Ranitidine ==================================================================== For clinical consultation, please call [phone]. ====================================================================    UDS interpretation: Compliant          Medication Assessment Form: Reviewed. Patient indicates being compliant with therapy Treatment compliance: Compliant Risk Assessment Profile: Aberrant behavior: See prior evaluations. None observed  or detected today Comorbid factors increasing risk of overdose: See prior notes. No additional risks detected today Risk of substance use disorder (SUD): Low Opioid Risk Tool (ORT) Total Score: 7  Interpretation Table:  Score <3 = Low Risk for SUD  Score between 4-7 = Moderate Risk for SUD  Score >8 = High Risk for Opioid Abuse   Risk Mitigation Strategies:  Patient Counseling: Covered Patient-Prescriber Agreement (PPA): Present and active  Notification to other healthcare providers: Done  Pharmacologic Plan: No change in therapy, at this time  Laboratory Chemistry  Inflammation Markers Lab Results  Component Value Date   ESRSEDRATE 15 09/22/2015   CRP 0.5 09/22/2015   Renal Function Lab Results  Component Value Date   BUN 14 09/14/2016   CREATININE 0.62 09/14/2016   GFRAA >60 09/14/2016   GFRNONAA >60 09/14/2016   Hepatic Function Lab Results  Component Value Date   AST 20 09/14/2016   ALT 13 (L) 09/14/2016   ALBUMIN 4.4 09/14/2016   Electrolytes Lab Results  Component Value Date   NA 136 09/14/2016   K 4.0 09/14/2016   CL 102 09/14/2016   CALCIUM 8.9 09/14/2016   MG 1.9 09/22/2015   Pain Modulating Vitamins Lab Results  Component Value Date   VITAMINB12 594 08/08/2015   Coagulation Parameters Lab Results  Component Value Date   INR 1.08 09/14/2016   LABPROT 14.0 09/14/2016   APTT 33 08/08/2015   PLT 69 (L) 09/14/2016   Cardiovascular Lab Results  Component Value Date   HGB 14.0 09/14/2016   HCT 41.1 09/14/2016   Note: Lab results reviewed.  Recent Diagnostic Imaging Review  US Abdomen Limited Ruq  Result Date: 09/17/2016 CLINICAL DATA:  Cirrhosis, splenomegaly EXAM: US ABDOMEN LIMITED - RIGHT UPPER QUADRANT COMPARISON:  None. FINDINGS: Gallbladder: Gallstones are noted within gallbladder the largest measures 5 mm. No sonographic Murphy's sign. No thickening of gallbladder wall Common bile duct: Diameter: 3.6 mm in diameter within normal  limits. Liver: No focal hepatic mass. Again noted nodular contour and diffuse increased echogenicity of the liver consistent with cirrhosis. IMPRESSION: 1. Layering small gallstones are noted within gallbladder the largest measures 5 mm. No thickening of gallbladder wall. Normal CBD. No sonographic Murphy's sign. 2. Again noted cirrhotic changes within liver. No focal solid hepatic mass. Electronically Signed   By: Lahoma Crocker M.D.   On: 09/17/2016 10:27   Note: Imaging results reviewed.          Meds  The patient has a current medication list which includes the following prescription(s): alprazolam, amoxicillin, baclofen, folic acid, gabapentin, magnesium oxide, oxycodone, oxycodone, and oxycodone.  Current Outpatient Prescriptions on File Prior to Visit  Medication Sig  . ALPRAZolam (XANAX) 0.25 MG tablet Take 0.25 mg by mouth at bedtime as needed for anxiety.  . folic acid (FOLVITE) 1 MG tablet Take 1 mg by mouth daily.    No current facility-administered medications on file prior to visit.    ROS  Constitutional: Denies any fever or chills Gastrointestinal: No reported  hemesis, hematochezia, vomiting, or acute GI distress Musculoskeletal: Denies any acute onset joint swelling, redness, loss of ROM, or weakness Neurological: No reported episodes of acute onset apraxia, aphasia, dysarthria, agnosia, amnesia, paralysis, loss of coordination, or loss of consciousness  Allergies  John [name] is allergic to codeine sulfate; morphine; and duloxetine.  [place]  Drug: John [name]  reports that he does not use drugs. Alcohol:  reports that he does not drink alcohol. Tobacco:  reports that he has been smoking Cigarettes.  He has a 17.50 pack-year smoking history. He has never used smokeless tobacco. Medical:  has a past medical history of Anxiety; Back ache (05/06/2014); Cervical pain (05/06/2014); COPD (chronic obstructive pulmonary disease) ([place]); Depression; Dystonia; Extremity pain (05/06/2014);  Febrile seizures ([place]); Foot pain (09/11/2014); Gout; Head injury (06/23/2015); Hemorrhoids; Hepatitis B; History of GI bleed; Hypertension; Leukopenia; Osteoarthritis of spine with radiculopathy, lumbar region (06/23/2015); Peripheral neuropathy ([place]) (06/23/2015); Personal history of tobacco use, presenting hazards to health (01/09/2016); Pulse irregularity; Seizures ([place]); Spinal stenosis; and Uncomplicated opioid dependence ([place]) (06/23/2015). Family: family history includes Cancer in his father; Cancer (age of onset: 39) in his brother; Heart disease in his father; Stroke in his father and mother.  Past Surgical History:  Procedure Laterality Date  . ESOPHAGOGASTRODUODENOSCOPY  11/2013  . surgery for cervical neck fracture    . TOOTH EXTRACTION Right 09/28/2016   Constitutional Exam  General appearance: Well nourished, well developed, and well hydrated. In no apparent acute distress Vitals:   10/05/16 0938  BP: 112/74  Pulse: (!) 55  Resp: 16  Temp: (!) 96.5 F (35.8 C)  TempSrc: Oral  SpO2: 100%  Weight: 180 lb (81.6 kg)  Height: 6' (1.829 m)   BMI Assessment: Estimated body mass index is 24.41 kg/m as calculated from the following:   Height as of this encounter: 6' (1.829 m).   Weight as of this encounter: 180 lb (81.6 kg).  BMI interpretation table: BMI level Category Range association with higher incidence of chronic pain  <18 kg/m2 Underweight   18.5-24.9 kg/m2 Ideal body weight   25-29.9 kg/m2 Overweight Increased incidence by 20%  30-34.9 kg/m2 Obese (Class I) Increased incidence by 68%  35-39.9 kg/m2 Severe obesity (Class II) Increased incidence by 136%  >40 kg/m2 Extreme obesity (Class III) Increased incidence by 254%   BMI Readings from Last 4 Encounters:  10/05/16 24.41 kg/m  09/14/16 25.27 kg/m  07/05/16 25.09 kg/m  04/21/16 25.50 kg/m   Wt Readings from Last 4 Encounters:  10/05/16 180 lb (81.6 kg)  09/14/16 186 lb 4.6 oz (84.5 kg)  07/05/16 185 lb (83.9  kg)  04/21/16 188 lb (85.3 kg)  Psych/Mental status: Alert, oriented x 3 (person, place, & time)       Eyes: PERLA Respiratory: No evidence of acute respiratory distress  Cervical Spine Exam  Inspection: No masses, redness, or swelling Alignment: Symmetrical Functional ROM: Unrestricted ROM Stability: No instability detected Muscle strength & Tone: Functionally intact Sensory: Unimpaired Palpation: Non-contributory  Upper Extremity (UE) Exam    Side: Right upper extremity  Side: Left upper extremity  Inspection: No masses, redness, swelling, or asymmetry  Inspection: No masses, redness, swelling, or asymmetry  Functional ROM: Unrestricted ROM          Functional ROM: Unrestricted ROM          Muscle strength & Tone: Functionally intact  Muscle strength & Tone: Functionally intact  Sensory: Unimpaired  Sensory: Unimpaired  Palpation: Non-contributory  Palpation: Non-contributory   Thoracic  Spine Exam  Inspection: No masses, redness, or swelling Alignment: Symmetrical Functional ROM: Unrestricted ROM Stability: No instability detected Sensory: Unimpaired Muscle strength & Tone: Functionally intact Palpation: Non-contributory  Lumbar Spine Exam  Inspection: No masses, redness, or swelling Alignment: Symmetrical Functional ROM: Unrestricted ROM Stability: No instability detected Muscle strength & Tone: Functionally intact Sensory: Unimpaired Palpation: Non-contributory Provocative Tests: Lumbar Hyperextension and rotation test: evaluation deferred today       Patrick's Maneuver: evaluation deferred today              Gait & Posture Assessment  Ambulation: Unassisted Gait: Relatively normal for age and body habitus Posture: WNL   Lower Extremity Exam    Side: Right lower extremity  Side: Left lower extremity  Inspection: No masses, redness, swelling, or asymmetry  Inspection: No masses, redness, swelling, or asymmetry  Functional ROM: Unrestricted ROM           Functional ROM: Unrestricted ROM          Muscle strength & Tone: Functionally intact  Muscle strength & Tone: Functionally intact  Sensory: Unimpaired  Sensory: Unimpaired  Palpation: Non-contributory  Palpation: Non-contributory   Assessment  Primary Diagnosis & Pertinent Problem List: The primary encounter diagnosis was Chronic pain syndrome. Diagnoses of Chronic low back pain (Location of Primary Source of Pain) (Bilateral) (L>R), Chronic lower extremity pain (Location of Secondary source of pain) (Bilateral) (L>R), Chronic neck pain (Location of Tertiary source of pain) (Bilateral) (R>L), Cervical radicular pain (Location of Tertiary source of pain) (Bilateral) (R>L), Lumbar facet syndrome (Location of Primary Source of Pain) (Bilateral) (L>R), Long term current use of opiate analgesic, Opiate use (7.5 MME/Day), Musculoskeletal pain, and Neurogenic pain were also pertinent to this visit.  Status Diagnosis  Controlled Controlled Controlled 1. Chronic pain syndrome   2. Chronic low back pain (Location of Primary Source of Pain) (Bilateral) (L>R)   3. Chronic lower extremity pain (Location of Secondary source of pain) (Bilateral) (L>R)   4. Chronic neck pain (Location of Tertiary source of pain) (Bilateral) (R>L)   5. Cervical radicular pain (Location of Tertiary source of pain) (Bilateral) (R>L)   6. Lumbar facet syndrome (Location of Primary Source of Pain) (Bilateral) (L>R)   7. Long term current use of opiate analgesic   8. Opiate use (7.5 MME/Day)   9. Musculoskeletal pain   10. Neurogenic pain      Plan of Care  Pharmacotherapy (Medications Ordered): Meds ordered this encounter  Medications  . oxyCODONE (OXY IR/ROXICODONE) 5 MG immediate release tablet    Sig: Take 0.5 tablets (2.5 mg total) by mouth 2 (two) times daily.    Dispense:  30 tablet    Refill:  0    Do not place this medication, or any other prescription from our practice, on "Automatic Refill". Patient may have  prescription filled one day early if pharmacy is closed on scheduled refill date. Do not fill until: 11/16/16 To last until: 12/16/16  . oxyCODONE (OXY IR/ROXICODONE) 5 MG immediate release tablet    Sig: Take 0.5 tablets (2.5 mg total) by mouth 2 (two) times daily.    Dispense:  30 tablet    Refill:  0    Do not place this medication, or any other prescription from our practice, on "Automatic Refill". Patient may have prescription filled one day early if pharmacy is closed on scheduled refill date. Do not fill until: 12/16/16 To last until: 01/15/17  . oxyCODONE (OXY IR/ROXICODONE) 5 MG immediate release tablet  Sig: Take 0.5 tablets (2.5 mg total) by mouth 2 (two) times daily.    Dispense:  30 tablet    Refill:  0    Do not place this medication, or any other prescription from our practice, on "Automatic Refill". Patient may have prescription filled one day early if pharmacy is closed on scheduled refill date. Do not fill until: 10/17/16 To last until: 11/16/16  . Magnesium Oxide 500 MG CAPS    Sig: Take 1 capsule (500 mg total) by mouth 2 (two) times daily at 8 am and 10 pm.    Dispense:  180 capsule    Refill:  0    Do not place this medication, or any other prescription from our practice, on "Automatic Refill". Patient may have prescription filled one day early if pharmacy is closed on scheduled refill date.  . baclofen (LIORESAL) 10 MG tablet    Sig: Take 1 tablet (10 mg total) by mouth 3 (three) times daily.    Dispense:  270 tablet    Refill:  0    Do not place this medication, or any other prescription from our practice, on "Automatic Refill". Patient may have prescription filled one day early if pharmacy is closed on scheduled refill date.  . gabapentin (NEURONTIN) 600 MG tablet    Sig: Take 1 tablet (600 mg total) by mouth every 8 (eight) hours.    Dispense:  270 tablet    Refill:  0    Do not place this medication, or any other prescription from our practice, on  "Automatic Refill". Patient may have prescription filled one day early if pharmacy is closed on scheduled refill date.   New Prescriptions   No medications on file   Medications administered today: John [name] had no medications administered during this visit. Lab-work, procedure(s), and/or referral(s): Orders Placed This Encounter  Procedures  . LUMBAR FACET(MEDIAL BRANCH NERVE BLOCK) MBNB   Imaging and/or referral(s): None  Interventional therapies: Planned, scheduled, and/or pending:   Diagnostic bilateral lumbar facet block under fluoroscopic guidance and IV sedation.   Considering: Diagnostic left L5-S1 lumbar epidural steroid injection under fluoroscopic guidance, with a without sedation. Diagnostic bilateral L4-5 transforaminal epidural steroid injection under fluoroscopic guidance, with or without sedation. Diagnostic bilateral lumbar facet block under fluoroscopic guidance and IV sedation. Diagnostic right-sided cervical epidural steroid injection under fluoroscopic guidance with or without sedation.    Palliative PRN treatment(s): Diagnostic left L5-S1 lumbar epidural steroid injection under fluoroscopic guidance, with a without sedation. Diagnostic bilateral L4-5 transforaminal epidural steroid injection under fluoroscopic guidance, with or without sedation. Diagnostic bilateral lumbar facet block under fluoroscopic guidance and IV sedation. Diagnostic right-sided cervical epidural steroid injection under fluoroscopic guidance with or without sedation.    Provider-requested follow-up: Return in about 3 months (around 01/03/2017) for (MD) Med-Mgmt, in addition, procedure (ASAA).  Future Appointments Date Time Provider [place]  03/14/2017 10:00 AM [facility] None  03/14/2017 10:15 AM Lequita Asal, MD [facility] None   Primary Care Physician: Cletis Athens, MD Location: [facility] Outpatient Pain Management Facility Note by: Kathlen Brunswick. Dossie Arbour, M.D,  DABA, DABAPM, DABPM, DABIPP, FIPP Date: 10/05/2016; Time: 12:52 PM  Pain Score Disclaimer: We use the NRS-11 scale. This is a self-reported, subjective measurement of pain severity with only modest accuracy. It is used primarily to identify changes within a particular patient. It must be understood that outpatient pain scales are significantly less accurate that those used for research, where they can be applied under ideal  controlled circumstances with minimal exposure to variables. In reality, the score is likely to be a combination of pain intensity and pain affect, where pain affect describes the degree of emotional arousal or changes in action readiness caused by the sensory experience of pain. Factors such as social and work situation, setting, emotional state, anxiety levels, expectation, and prior pain experience may influence pain perception and show large inter-individual differences that may also be affected by time variables.  Patient instructions provided during this appointment: Patient Instructions   GENERAL RISKS AND COMPLICATIONS  What are the risk, side effects and possible complications? Generally speaking, most procedures are safe.  However, with any procedure there are risks, side effects, and the possibility of complications.  The risks and complications are dependent upon the sites that are lesioned, or the type of nerve block to be performed.  The closer the procedure is to the spine, the more serious the risks are.  Great care is taken when placing the radio frequency needles, block needles or lesioning probes, but sometimes complications can occur. 1. Infection: Any time there is an injection through the skin, there is a risk of infection.  This is why sterile conditions are used for these blocks.  There are four possible types of infection. 1. Localized skin infection. 2. Central Nervous System Infection-This can be in the form of Meningitis, which can be deadly. 3. Epidural  Infections-This can be in the form of an epidural abscess, which can cause pressure inside of the spine, causing compression of the spinal cord with subsequent paralysis. This would require an emergency surgery to decompress, and there are no guarantees that the patient would recover from the paralysis. 4. Discitis-This is an infection of the intervertebral discs.  It occurs in about 1% of discography procedures.  It is difficult to treat and it may lead to surgery.        2. Pain: the needles have to go through skin and soft tissues, will cause soreness.       3. Damage to internal structures:  The nerves to be lesioned may be near blood vessels or    other nerves which can be potentially damaged.       4. Bleeding: Bleeding is more common if the patient is taking blood thinners such as  aspirin, Coumadin, Ticiid, Plavix, etc., or if he/she have some genetic predisposition  such as hemophilia. Bleeding into the spinal canal can cause compression of the spinal  cord with subsequent paralysis.  This would require an emergency surgery to  decompress and there are no guarantees that the patient would recover from the  paralysis.       5. Pneumothorax:  Puncturing of a lung is a possibility, every time a needle is introduced in  the area of the chest or upper back.  Pneumothorax refers to free air around the  collapsed lung(s), inside of the thoracic cavity (chest cavity).  Another two possible  complications related to a similar event would include: Hemothorax and Chylothorax.   These are variations of the Pneumothorax, where instead of air around the collapsed  lung(s), you may have blood or chyle, respectively.       6. Spinal headaches: They may occur with any procedures in the area of the spine.       7. Persistent CSF (Cerebro-Spinal Fluid) leakage: This is a rare problem, but may occur  with prolonged intrathecal or epidural catheters either due to the formation of a fistulous  track  or a dural tear.        8. Nerve damage: By working so close to the spinal cord, there is always a possibility of  nerve damage, which could be as serious as a permanent spinal cord injury with  paralysis.       9. Death:  Although rare, severe deadly allergic reactions known as "Anaphylactic  reaction" can occur to any of the medications used.      10. Worsening of the symptoms:  We can always make thing worse.  What are the chances of something like this happening? Chances of any of this occuring are extremely low.  By statistics, you have more of a chance of getting killed in a motor vehicle accident: while driving to the hospital than any of the above occurring .  Nevertheless, you should be aware that they are possibilities.  In general, it is similar to taking a shower.  Everybody knows that you can slip, hit your head and get killed.  Does that mean that you should not shower again?  Nevertheless always keep in mind that statistics do not mean anything if you happen to be on the wrong side of them.  Even if a procedure has a 1 (one) in a 1,000,000 (million) chance of going wrong, it you happen to be that one..Also, keep in mind that by statistics, you have more of a chance of having something go wrong when taking medications.  Who should not have this procedure? If you are on a blood thinning medication (e.g. Coumadin, Plavix, see list of "Blood Thinners"), or if you have an active infection going on, you should not have the procedure.  If you are taking any blood thinners, please inform your physician.  How should I prepare for this procedure?  Do not eat or drink anything at least six hours prior to the procedure.  Bring a driver with you .  It cannot be a taxi.  Come accompanied by an adult that can drive you back, and that is strong enough to help you if your legs get weak or numb from the local anesthetic.  Take all of your medicines the morning of the procedure with just enough water to swallow them.  If  you have diabetes, make sure that you are scheduled to have your procedure done first thing in the morning, whenever possible.  If you have diabetes, take only half of your insulin dose and notify our nurse that you have done so as soon as you arrive at the clinic.  If you are diabetic, but only take blood sugar pills (oral hypoglycemic), then do not take them on the morning of your procedure.  You may take them after you have had the procedure.  Do not take aspirin or any aspirin-containing medications, at least eleven (11) days prior to the procedure.  They may prolong bleeding.  Wear loose fitting clothing that may be easy to take off and that you would not mind if it got stained with Betadine or blood.  Do not wear any jewelry or perfume  Remove any nail coloring.  It will interfere with some of our monitoring equipment.  NOTE: Remember that this is not meant to be interpreted as a complete list of all possible complications.  Unforeseen problems may occur.  BLOOD THINNERS The following drugs contain aspirin or other products, which can cause increased bleeding during surgery and should not be taken for 2 weeks prior to and 1 week after surgery.  If you should need take something for relief of minor pain, you may take acetaminophen which is found in Tylenol,m Datril, Anacin-3 and Panadol. It is not blood thinner. The products listed below are.  Do not take any of the products listed below in addition to any listed on your instruction sheet.  A.P.C or A.P.C with Codeine Codeine Phosphate Capsules #3 Ibuprofen Ridaura  ABC compound Congesprin Imuran rimadil  Advil Cope Indocin Robaxisal  Alka-Seltzer Effervescent Pain Reliever and Antacid Coricidin or Coricidin-D  Indomethacin Rufen  Alka-Seltzer plus Cold Medicine Cosprin Ketoprofen S-A-C Tablets  Anacin Analgesic Tablets or Capsules Coumadin Korlgesic Salflex  Anacin Extra Strength Analgesic tablets or capsules CP-2 Tablets Lanoril  Salicylate  Anaprox Cuprimine Capsules Levenox Salocol  Anexsia-D Dalteparin Magan Salsalate  Anodynos Darvon compound Magnesium Salicylate Sine-off  Ansaid Dasin Capsules Magsal Sodium Salicylate  Anturane Depen Capsules Marnal Soma  APF Arthritis pain formula Dewitt's Pills Measurin Stanback  Argesic Dia-Gesic Meclofenamic Sulfinpyrazone  Arthritis Bayer Timed Release Aspirin Diclofenac Meclomen Sulindac  Arthritis pain formula Anacin Dicumarol Medipren Supac  Analgesic (Safety coated) Arthralgen Diffunasal Mefanamic Suprofen  Arthritis Strength Bufferin Dihydrocodeine Mepro Compound Suprol  Arthropan liquid Dopirydamole Methcarbomol with Aspirin Synalgos  ASA tablets/Enseals Disalcid Micrainin Tagament  Ascriptin Doan's Midol Talwin  Ascriptin A/D Dolene Mobidin Tanderil  Ascriptin Extra Strength Dolobid Moblgesic Ticlid  Ascriptin with Codeine Doloprin or Doloprin with Codeine Momentum Tolectin  Asperbuf Duoprin Mono-gesic Trendar  Aspergum Duradyne Motrin or Motrin IB Triminicin  Aspirin plain, buffered or enteric coated Durasal Myochrisine Trigesic  Aspirin Suppositories Easprin Nalfon Trillsate  Aspirin with Codeine Ecotrin Regular or Extra Strength Naprosyn Uracel  Atromid-S Efficin Naproxen Ursinus  Auranofin Capsules Elmiron Neocylate Vanquish  Axotal Emagrin Norgesic Verin  Azathioprine Empirin or Empirin with Codeine Normiflo Vitamin E  Azolid Emprazil Nuprin Voltaren  Bayer Aspirin plain, buffered or children's or timed BC Tablets or powders Encaprin Orgaran Warfarin Sodium  Buff-a-Comp Enoxaparin Orudis Zorpin  Buff-a-Comp with Codeine Equegesic Os-Cal-Gesic   Buffaprin Excedrin plain, buffered or Extra Strength Oxalid   Bufferin Arthritis Strength Feldene Oxphenbutazone   Bufferin plain or Extra Strength Feldene Capsules Oxycodone with Aspirin   Bufferin with Codeine Fenoprofen Fenoprofen Pabalate or Pabalate-SF   Buffets II Flogesic Panagesic   Buffinol plain or  Extra Strength Florinal or Florinal with Codeine Panwarfarin   Buf-Tabs Flurbiprofen Penicillamine   Butalbital Compound Four-way cold tablets Penicillin   Butazolidin Fragmin Pepto-Bismol   Carbenicillin Geminisyn Percodan   Carna Arthritis Reliever Geopen Persantine   Carprofen Gold's salt Persistin   Chloramphenicol Goody's Phenylbutazone   Chloromycetin Haltrain Piroxlcam   Clmetidine heparin Plaquenil   Cllnoril Hyco-pap Ponstel   Clofibrate Hydroxy chloroquine Propoxyphen         Before stopping any of these medications, be sure to consult the physician who ordered them.  Some, such as Coumadin (Warfarin) are ordered to prevent or treat serious conditions such as "deep thrombosis", "pumonary embolisms", and other heart problems.  The amount of time that you may need off of the medication may also vary with the medication and the reason for which you were taking it.  If you are taking any of these medications, please make sure you notify your pain physician before you undergo any procedures.         Facet Joint Block The facet joints connect the bones of the spine (vertebrae). They make it possible for you to bend, twist, and make other movements with your spine. They also prevent you from  overbending, overtwisting, and making other excessive movements.  A facet joint block is a procedure where a numbing medicine (anesthetic) is injected into a facet joint. Often, a type of anti-inflammatory medicine called a steroid is also injected. A facet joint block may be done for two reasons:   Diagnosis. A facet joint block may be done as a test to see whether neck or back pain is caused by a worn-down or infected facet joint. If the pain gets better after a facet joint block, it means the pain is probably coming from the facet joint. If the pain does not get better, it means the pain is probably not coming from the facet joint.   Therapy. A facet joint block may be done to relieve neck or  back pain caused by a facet joint. A facet joint block is only done as a therapy if the pain does not improve with medicine, exercise programs, physical therapy, and other forms of pain management. LET [facility] CARE PROVIDER KNOW ABOUT:   Any allergies you have.   All medicines you are taking, including vitamins, herbs, eyedrops, and over-the-counter medicines and creams.   Previous problems you or members of your family have had with the use of anesthetics.   Any blood disorders you have had.   Other health problems you have. RISKS AND COMPLICATIONS Generally, having a facet joint block is safe. However, as with any procedure, complications can occur. Possible complications associated with having a facet joint block include:   Bleeding.   Injury to a nerve near the injection site.   Pain at the injection site.   Weakness or numbness in areas controlled by nerves near the injection site.   Infection.   Temporary fluid retention.   Allergic reaction to anesthetics or medicines used during the procedure. BEFORE THE PROCEDURE   Follow your health care provider's instructions if you are taking dietary supplements or medicines. You may need to stop taking them or reduce your dosage.   Do not take any new dietary supplements or medicines without asking your health care provider first.   Follow your health care provider's instructions about eating and drinking before the procedure. You may need to stop eating and drinking several hours before the procedure.   Arrange to have an adult drive you home after the procedure. PROCEDURE  You may need to remove your clothing and dress in an open-back gown so that your health care provider can access your spine.   The procedure will be done while you are lying on an X-ray table. Most of the time you will be asked to lie on your stomach, but you may be asked to lie in a different position if an injection will be made in your  neck.   Special machines will be used to monitor your oxygen levels, heart rate, and blood pressure.   If an injection will be made in your neck, an intravenous (IV) tube will be inserted into one of your veins. Fluids and medicine will flow directly into your body through the IV tube.   The area over the facet joint where the injection will be made will be cleaned with an antiseptic soap. The surrounding skin will be covered with sterile drapes.   An anesthetic will be applied to your skin to make the injection area numb. You may feel a temporary stinging or burning sensation.   A video X-ray machine will be used to locate the joint. A contrast dye may be  injected into the facet joint area to help with locating the joint.   When the joint is located, an anesthetic medicine will be injected into the joint through the needle.   Your health care provider will ask you whether you feel pain relief. If you do feel relief, a steroid may be injected to provide pain relief for a longer period of time. If you do not feel relief or feel only partial relief, additional injections of an anesthetic may be made in other facet joints.   The needle will be removed, the skin will be cleansed, and bandages will be applied.  AFTER THE PROCEDURE   You will be observed for 15-30 minutes before being allowed to go home. Do not drive. Have an adult drive you or take a taxi or public transportation instead.   If you feel pain relief, the pain will return in several hours or days when the anesthetic wears off.   You may feel pain relief 2-14 days after the procedure. The amount of time this relief lasts varies from person to person.   It is normal to feel some tenderness over the injected area(s) for 2 days following the procedure.   If you have diabetes, you may have a temporary increase in blood sugar. This information is not intended to replace advice given to you by your health care provider.  Make sure you discuss any questions you have with your health care provider. Document Released: 01/12/2007 Document Revised: 09/13/2014 Document Reviewed: 05/19/2015 [employer] Interactive Patient Education  2017 [employer].

## 2016-10-05 NOTE — Patient Instructions (Signed)
GENERAL RISKS AND COMPLICATIONS  What are the risk, side effects and possible complications? Generally speaking, most procedures are safe.  However, with any procedure there are risks, side effects, and the possibility of complications.  The risks and complications are dependent upon the sites that are lesioned, or the type of nerve block to be performed.  The closer the procedure is to the spine, the more serious the risks are.  Great care is taken when placing the radio frequency needles, block needles or lesioning probes, but sometimes complications can occur. 1. Infection: Any time there is an injection through the skin, there is a risk of infection.  This is why sterile conditions are used for these blocks.  There are four possible types of infection. 1. Localized skin infection. 2. Central Nervous System Infection-This can be in the form of Meningitis, which can be deadly. 3. Epidural Infections-This can be in the form of an epidural abscess, which can cause pressure inside of the spine, causing compression of the spinal cord with subsequent paralysis. This would require an emergency surgery to decompress, and there are no guarantees that the patient would recover from the paralysis. 4. Discitis-This is an infection of the intervertebral discs.  It occurs in about 1% of discography procedures.  It is difficult to treat and it may lead to surgery.        2. Pain: the needles have to go through skin and soft tissues, will cause soreness.       3. Damage to internal structures:  The nerves to be lesioned may be near blood vessels or    other nerves which can be potentially damaged.       4. Bleeding: Bleeding is more common if the patient is taking blood thinners such as  aspirin, Coumadin, Ticiid, Plavix, etc., or if he/she have some genetic predisposition  such as hemophilia. Bleeding into the spinal canal can cause compression of the spinal  cord with subsequent paralysis.  This would require an  emergency surgery to  decompress and there are no guarantees that the patient would recover from the  paralysis.       5. Pneumothorax:  Puncturing of a lung is a possibility, every time a needle is introduced in  the area of the chest or upper back.  Pneumothorax refers to free air around the  collapsed lung(s), inside of the thoracic cavity (chest cavity).  Another two possible  complications related to a similar event would include: Hemothorax and Chylothorax.   These are variations of the Pneumothorax, where instead of air around the collapsed  lung(s), you may have blood or chyle, respectively.       6. Spinal headaches: They may occur with any procedures in the area of the spine.       7. Persistent CSF (Cerebro-Spinal Fluid) leakage: This is a rare problem, but may occur  with prolonged intrathecal or epidural catheters either due to the formation of a fistulous  track or a dural tear.       8. Nerve damage: By working so close to the spinal cord, there is always a possibility of  nerve damage, which could be as serious as a permanent spinal cord injury with  paralysis.       9. Death:  Although rare, severe deadly allergic reactions known as "Anaphylactic  reaction" can occur to any of the medications used.      10. Worsening of the symptoms:  We can always make thing worse.    What are the chances of something like this happening? Chances of any of this occuring are extremely low.  By statistics, you have more of a chance of getting killed in a motor vehicle accident: while driving to the hospital than any of the above occurring .  Nevertheless, you should be aware that they are possibilities.  In general, it is similar to taking a shower.  Everybody knows that you can slip, hit your head and get killed.  Does that mean that you should not shower again?  Nevertheless always keep in mind that statistics do not mean anything if you happen to be on the wrong side of them.  Even if a procedure has a 1  (one) in a 1,000,000 (million) chance of going wrong, it you happen to be that one..Also, keep in mind that by statistics, you have more of a chance of having something go wrong when taking medications.  Who should not have this procedure? If you are on a blood thinning medication (e.g. Coumadin, Plavix, see list of "Blood Thinners"), or if you have an active infection going on, you should not have the procedure.  If you are taking any blood thinners, please inform your physician.  How should I prepare for this procedure?  Do not eat or drink anything at least six hours prior to the procedure.  Bring a driver with you .  It cannot be a taxi.  Come accompanied by an adult that can drive you back, and that is strong enough to help you if your legs get weak or numb from the local anesthetic.  Take all of your medicines the morning of the procedure with just enough water to swallow them.  If you have diabetes, make sure that you are scheduled to have your procedure done first thing in the morning, whenever possible.  If you have diabetes, take only half of your insulin dose and notify our nurse that you have done so as soon as you arrive at the clinic.  If you are diabetic, but only take blood sugar pills (oral hypoglycemic), then do not take them on the morning of your procedure.  You may take them after you have had the procedure.  Do not take aspirin or any aspirin-containing medications, at least eleven (11) days prior to the procedure.  They may prolong bleeding.  Wear loose fitting clothing that may be easy to take off and that you would not mind if it got stained with Betadine or blood.  Do not wear any jewelry or perfume  Remove any nail coloring.  It will interfere with some of our monitoring equipment.  NOTE: Remember that this is not meant to be interpreted as a complete list of all possible complications.  Unforeseen problems may occur.  BLOOD THINNERS The following drugs  contain aspirin or other products, which can cause increased bleeding during surgery and should not be taken for 2 weeks prior to and 1 week after surgery.  If you should need take something for relief of minor pain, you may take acetaminophen which is found in Tylenol,m Datril, Anacin-3 and Panadol. It is not blood thinner. The products listed below are.  Do not take any of the products listed below in addition to any listed on your instruction sheet.  A.P.C or A.P.C with Codeine Codeine Phosphate Capsules #3 Ibuprofen Ridaura  ABC compound Congesprin Imuran rimadil  Advil Cope Indocin Robaxisal  Alka-Seltzer Effervescent Pain Reliever and Antacid Coricidin or Coricidin-D  Indomethacin Rufen    Alka-Seltzer plus Cold Medicine Cosprin Ketoprofen S-A-C Tablets  Anacin Analgesic Tablets or Capsules Coumadin Korlgesic Salflex  Anacin Extra Strength Analgesic tablets or capsules CP-2 Tablets Lanoril Salicylate  Anaprox Cuprimine Capsules Levenox Salocol  Anexsia-D Dalteparin Magan Salsalate  Anodynos Darvon compound Magnesium Salicylate Sine-off  Ansaid Dasin Capsules Magsal Sodium Salicylate  Anturane Depen Capsules Marnal Soma  APF Arthritis pain formula Dewitt's Pills Measurin Stanback  Argesic Dia-Gesic Meclofenamic Sulfinpyrazone  Arthritis Bayer Timed Release Aspirin Diclofenac Meclomen Sulindac  Arthritis pain formula Anacin Dicumarol Medipren Supac  Analgesic (Safety coated) Arthralgen Diffunasal Mefanamic Suprofen  Arthritis Strength Bufferin Dihydrocodeine Mepro Compound Suprol  Arthropan liquid Dopirydamole Methcarbomol with Aspirin Synalgos  ASA tablets/Enseals Disalcid Micrainin Tagament  Ascriptin Doan's Midol Talwin  Ascriptin A/D Dolene Mobidin Tanderil  Ascriptin Extra Strength Dolobid Moblgesic Ticlid  Ascriptin with Codeine Doloprin or Doloprin with Codeine Momentum Tolectin  Asperbuf Duoprin Mono-gesic Trendar  Aspergum Duradyne Motrin or Motrin IB Triminicin  Aspirin  plain, buffered or enteric coated Durasal Myochrisine Trigesic  Aspirin Suppositories Easprin Nalfon Trillsate  Aspirin with Codeine Ecotrin Regular or Extra Strength Naprosyn Uracel  Atromid-S Efficin Naproxen Ursinus  Auranofin Capsules Elmiron Neocylate Vanquish  Axotal Emagrin Norgesic Verin  Azathioprine Empirin or Empirin with Codeine Normiflo Vitamin E  Azolid Emprazil Nuprin Voltaren  Bayer Aspirin plain, buffered or children's or timed BC Tablets or powders Encaprin Orgaran Warfarin Sodium  Buff-a-Comp Enoxaparin Orudis Zorpin  Buff-a-Comp with Codeine Equegesic Os-Cal-Gesic   Buffaprin Excedrin plain, buffered or Extra Strength Oxalid   Bufferin Arthritis Strength Feldene Oxphenbutazone   Bufferin plain or Extra Strength Feldene Capsules Oxycodone with Aspirin   Bufferin with Codeine Fenoprofen Fenoprofen Pabalate or Pabalate-SF   Buffets II Flogesic Panagesic   Buffinol plain or Extra Strength Florinal or Florinal with Codeine Panwarfarin   Buf-Tabs Flurbiprofen Penicillamine   Butalbital Compound Four-way cold tablets Penicillin   Butazolidin Fragmin Pepto-Bismol   Carbenicillin Geminisyn Percodan   Carna Arthritis Reliever Geopen Persantine   Carprofen Gold's salt Persistin   Chloramphenicol Goody's Phenylbutazone   Chloromycetin Haltrain Piroxlcam   Clmetidine heparin Plaquenil   Cllnoril Hyco-pap Ponstel   Clofibrate Hydroxy chloroquine Propoxyphen         Before stopping any of these medications, be sure to consult the physician who ordered them.  Some, such as Coumadin (Warfarin) are ordered to prevent or treat serious conditions such as "deep thrombosis", "pumonary embolisms", and other heart problems.  The amount of time that you may need off of the medication may also vary with the medication and the reason for which you were taking it.  If you are taking any of these medications, please make sure you notify your pain physician before you undergo any  procedures.         Facet Joint Block The facet joints connect the bones of the spine (vertebrae). They make it possible for you to bend, twist, and make other movements with your spine. They also prevent you from overbending, overtwisting, and making other excessive movements.  A facet joint block is a procedure where a numbing medicine (anesthetic) is injected into a facet joint. Often, a type of anti-inflammatory medicine called a steroid is also injected. A facet joint block may be done for two reasons:   Diagnosis. A facet joint block may be done as a test to see whether neck or back pain is caused by a worn-down or infected facet joint. If the pain gets better after a facet joint block, it means the   pain is probably coming from the facet joint. If the pain does not get better, it means the pain is probably not coming from the facet joint.   Therapy. A facet joint block may be done to relieve neck or back pain caused by a facet joint. A facet joint block is only done as a therapy if the pain does not improve with medicine, exercise programs, physical therapy, and other forms of pain management. LET YOUR HEALTH CARE PROVIDER KNOW ABOUT:   Any allergies you have.   All medicines you are taking, including vitamins, herbs, eyedrops, and over-the-counter medicines and creams.   Previous problems you or members of your family have had with the use of anesthetics.   Any blood disorders you have had.   Other health problems you have. RISKS AND COMPLICATIONS Generally, having a facet joint block is safe. However, as with any procedure, complications can occur. Possible complications associated with having a facet joint block include:   Bleeding.   Injury to a nerve near the injection site.   Pain at the injection site.   Weakness or numbness in areas controlled by nerves near the injection site.   Infection.   Temporary fluid retention.   Allergic reaction to  anesthetics or medicines used during the procedure. BEFORE THE PROCEDURE   Follow your health care provider's instructions if you are taking dietary supplements or medicines. You may need to stop taking them or reduce your dosage.   Do not take any new dietary supplements or medicines without asking your health care provider first.   Follow your health care provider's instructions about eating and drinking before the procedure. You may need to stop eating and drinking several hours before the procedure.   Arrange to have an adult drive you home after the procedure. PROCEDURE  You may need to remove your clothing and dress in an open-back gown so that your health care provider can access your spine.   The procedure will be done while you are lying on an X-ray table. Most of the time you will be asked to lie on your stomach, but you may be asked to lie in a different position if an injection will be made in your neck.   Special machines will be used to monitor your oxygen levels, heart rate, and blood pressure.   If an injection will be made in your neck, an intravenous (IV) tube will be inserted into one of your veins. Fluids and medicine will flow directly into your body through the IV tube.   The area over the facet joint where the injection will be made will be cleaned with an antiseptic soap. The surrounding skin will be covered with sterile drapes.   An anesthetic will be applied to your skin to make the injection area numb. You may feel a temporary stinging or burning sensation.   A video X-ray machine will be used to locate the joint. A contrast dye may be injected into the facet joint area to help with locating the joint.   When the joint is located, an anesthetic medicine will be injected into the joint through the needle.   Your health care provider will ask you whether you feel pain relief. If you do feel relief, a steroid may be injected to provide pain relief for a  longer period of time. If you do not feel relief or feel only partial relief, additional injections of an anesthetic may be made in other facet joints.     The needle will be removed, the skin will be cleansed, and bandages will be applied.  AFTER THE PROCEDURE   You will be observed for 15-30 minutes before being allowed to go home. Do not drive. Have an adult drive you or take a taxi or public transportation instead.   If you feel pain relief, the pain will return in several hours or days when the anesthetic wears off.   You may feel pain relief 2-14 days after the procedure. The amount of time this relief lasts varies from person to person.   It is normal to feel some tenderness over the injected area(s) for 2 days following the procedure.   If you have diabetes, you may have a temporary increase in blood sugar. This information is not intended to replace advice given to you by your health care provider. Make sure you discuss any questions you have with your health care provider. Document Released: 01/12/2007 Document Revised: 09/13/2014 Document Reviewed: 05/19/2015 Elsevier Interactive Patient Education  2017 [employer].

## 2016-10-19 ENCOUNTER — Telehealth: Payer: Self-pay | Admitting: Pain Medicine

## 2016-10-19 NOTE — Telephone Encounter (Signed)
Mr. John [name] left voicemail for me that he has decided not to have the lumbar facets  I have added a order note, [employer] authorization was received and expires 12/02/2016 if patient decides to proceed with procedure

## 2016-11-07 ENCOUNTER — Encounter: Payer: Self-pay | Admitting: Hematology and Oncology

## 2016-12-15 ENCOUNTER — Other Ambulatory Visit: Payer: Self-pay | Admitting: Pain Medicine

## 2016-12-15 DIAGNOSIS — M7918 Myalgia, other site: Secondary | ICD-10-CM

## 2016-12-30 ENCOUNTER — Ambulatory Visit: Payer: Medicare PPO | Admitting: Pain Medicine

## 2016-12-30 ENCOUNTER — Encounter (INDEPENDENT_AMBULATORY_CARE_PROVIDER_SITE_OTHER): Payer: Self-pay

## 2016-12-30 ENCOUNTER — Encounter: Payer: Self-pay | Admitting: Nurse Practitioner

## 2016-12-30 ENCOUNTER — Ambulatory Visit: Payer: Medicare PPO | Attending: Pain Medicine | Admitting: Nurse Practitioner

## 2016-12-30 VITALS — BP 106/54 | HR 53 | Temp 98.3°F | Resp 18 | Ht 72.0 in | Wt 182.0 lb

## 2016-12-30 DIAGNOSIS — J449 Chronic obstructive pulmonary disease, unspecified: Secondary | ICD-10-CM | POA: Diagnosis not present

## 2016-12-30 DIAGNOSIS — M5412 Radiculopathy, cervical region: Secondary | ICD-10-CM | POA: Insufficient documentation

## 2016-12-30 DIAGNOSIS — M79601 Pain in right arm: Secondary | ICD-10-CM | POA: Insufficient documentation

## 2016-12-30 DIAGNOSIS — M47816 Spondylosis without myelopathy or radiculopathy, lumbar region: Secondary | ICD-10-CM | POA: Diagnosis not present

## 2016-12-30 DIAGNOSIS — Z79891 Long term (current) use of opiate analgesic: Secondary | ICD-10-CM | POA: Diagnosis not present

## 2016-12-30 DIAGNOSIS — G40909 Epilepsy, unspecified, not intractable, without status epilepticus: Secondary | ICD-10-CM | POA: Diagnosis not present

## 2016-12-30 DIAGNOSIS — F1021 Alcohol dependence, in remission: Secondary | ICD-10-CM | POA: Diagnosis not present

## 2016-12-30 DIAGNOSIS — Z720 Tobacco use: Secondary | ICD-10-CM | POA: Diagnosis not present

## 2016-12-30 DIAGNOSIS — K259 Gastric ulcer, unspecified as acute or chronic, without hemorrhage or perforation: Secondary | ICD-10-CM | POA: Diagnosis not present

## 2016-12-30 DIAGNOSIS — F329 Major depressive disorder, single episode, unspecified: Secondary | ICD-10-CM | POA: Insufficient documentation

## 2016-12-30 DIAGNOSIS — K860 Alcohol-induced chronic pancreatitis: Secondary | ICD-10-CM | POA: Diagnosis not present

## 2016-12-30 DIAGNOSIS — M7918 Myalgia, other site: Secondary | ICD-10-CM

## 2016-12-30 DIAGNOSIS — G621 Alcoholic polyneuropathy: Secondary | ICD-10-CM | POA: Diagnosis not present

## 2016-12-30 DIAGNOSIS — K219 Gastro-esophageal reflux disease without esophagitis: Secondary | ICD-10-CM | POA: Diagnosis not present

## 2016-12-30 DIAGNOSIS — M4802 Spinal stenosis, cervical region: Secondary | ICD-10-CM | POA: Insufficient documentation

## 2016-12-30 DIAGNOSIS — M79602 Pain in left arm: Secondary | ICD-10-CM | POA: Insufficient documentation

## 2016-12-30 DIAGNOSIS — D696 Thrombocytopenia, unspecified: Secondary | ICD-10-CM | POA: Insufficient documentation

## 2016-12-30 DIAGNOSIS — M48061 Spinal stenosis, lumbar region without neurogenic claudication: Secondary | ICD-10-CM | POA: Diagnosis not present

## 2016-12-30 DIAGNOSIS — M545 Low back pain: Secondary | ICD-10-CM | POA: Insufficient documentation

## 2016-12-30 DIAGNOSIS — G894 Chronic pain syndrome: Secondary | ICD-10-CM | POA: Diagnosis not present

## 2016-12-30 DIAGNOSIS — M4696 Unspecified inflammatory spondylopathy, lumbar region: Secondary | ICD-10-CM | POA: Diagnosis not present

## 2016-12-30 DIAGNOSIS — G5603 Carpal tunnel syndrome, bilateral upper limbs: Secondary | ICD-10-CM | POA: Diagnosis not present

## 2016-12-30 DIAGNOSIS — R262 Difficulty in walking, not elsewhere classified: Secondary | ICD-10-CM | POA: Diagnosis not present

## 2016-12-30 DIAGNOSIS — S0990XA Unspecified injury of head, initial encounter: Secondary | ICD-10-CM | POA: Insufficient documentation

## 2016-12-30 DIAGNOSIS — M791 Myalgia: Secondary | ICD-10-CM | POA: Diagnosis not present

## 2016-12-30 DIAGNOSIS — M9983 Other biomechanical lesions of lumbar region: Secondary | ICD-10-CM | POA: Diagnosis not present

## 2016-12-30 DIAGNOSIS — M47812 Spondylosis without myelopathy or radiculopathy, cervical region: Secondary | ICD-10-CM | POA: Insufficient documentation

## 2016-12-30 DIAGNOSIS — K5909 Other constipation: Secondary | ICD-10-CM | POA: Diagnosis not present

## 2016-12-30 DIAGNOSIS — K7460 Unspecified cirrhosis of liver: Secondary | ICD-10-CM | POA: Diagnosis not present

## 2016-12-30 DIAGNOSIS — G4733 Obstructive sleep apnea (adult) (pediatric): Secondary | ICD-10-CM | POA: Diagnosis not present

## 2016-12-30 MED ORDER — OXYCODONE HCL 5 MG PO TABS: 2.5000 mg | ORAL_TABLET | Freq: Two times a day (BID) | ORAL | 0 refills | Status: DC

## 2016-12-30 MED ORDER — GABAPENTIN 300 MG PO CAPS: 300.0000 mg | ORAL_CAPSULE | Freq: Two times a day (BID) | ORAL | 0 refills | Status: DC

## 2016-12-30 MED ORDER — BACLOFEN 10 MG PO TABS: 10.0000 mg | ORAL_TABLET | Freq: Three times a day (TID) | ORAL | 0 refills | Status: DC

## 2016-12-30 NOTE — Progress Notes (Signed)
Nursing Pain Medication Assessment:  Safety precautions to be maintained throughout the outpatient stay will include: orient to surroundings, keep bed in low position, maintain call bell within reach at all times, provide assistance with transfer out of bed and ambulation.  Medication Inspection Compliance: Pill count conducted under aseptic conditions, in front of the patient. Neither the pills nor the bottle was removed from the patient's sight at any time. Once count was completed pills were immediately returned to the patient in their original bottle.  Medication: Oxycodone IR Pill/Patch Count: 20 of 30 pills remain Pill/Patch Appearance: Markings consistent with prescribed medication Bottle Appearance: Standard pharmacy container. Clearly labeled. Filled Date: 04 / 16 / 2018 Last Medication intake:  Today

## 2016-12-30 NOTE — Progress Notes (Signed)
Patient's Name: John [name]  MRN: [id number]  Referring Provider: Cletis Athens, MD  DOB: [birth year]  PCP: Cletis Athens, MD  DOS: 12/30/2016  Note by: Vevelyn Francois NP  Service setting: Ambulatory outpatient  Specialty: Interventional Pain Management  Location: [facility] (AMB) Pain Management Facility    Patient type: Established    Primary Reason(s) for Visit: Encounter for prescription drug management (Level of risk: moderate) CC: Back Pain (low)  HPI  John [name] is a 61 y.o. year old, male patient, who comes today for a medication management evaluation. He has Encounter for therapeutic drug level monitoring; Long term current use of opiate analgesic; Opiate use (7.5 MME/Day); Lumbar spondylosis; Chronic low back pain (Location of Primary Source of Pain) (Bilateral) (L>R); IVP/radiological dye Allergy; Thrombocytopenia ([place]); History of alcoholism; Carpal tunnel syndrome; Clinical depression; Difficulty in walking; Dystonia; Gastric ulcer; HCV (hepatitis C virus); H/O neoplasm; BP (high blood pressure); Decreased leukocytes; Hepatic cirrhosis ([place]); Loss of feeling or sensation; Absence of sensation; Current tobacco use; Has a tremor; Osteoarthritis of spine with radiculopathy, lumbosacral region; Peripheral neuropathy (Alcoholic); Chronic neck pain (Location of Tertiary source of pain) (Bilateral) (R>L); Cervical spondylosis; Obstructive sleep apnea; History of panic attacks; GERD (gastroesophageal reflux disease); Chronic constipation; 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[place]); Leukopenia; Lumbar facet syndrome (Location of Primary Source of Pain) (Bilateral) (L>R); Chronic lumbar radicular pain (Right S1 and Left L5) (Location of Secondary source of pain) (Bilateral) (L>R); Cervical radicular pain (Location of Tertiary source of pain) (Bilateral) (R>L); Chronic obstructive pulmonary disease (COPD) ([place]); Head injury; Epileptic disorder ([place]); Long term prescription opiate use; Encounter for chronic pain  management; Radicular pain of shoulder (Bilateral) (R>L); Opioid-induced constipation ([place]); Personal history of tobacco use, presenting hazards to health; Neurogenic pain; Musculoskeletal pain; Chronic lower extremity pain (Location of Secondary source of pain) (Bilateral) (L>R); Chronic upper extremity pain (Location of Tertiary source of pain) (Bilateral) (R>L); Abnormal MRI, lumbar spine (2015); Lumbar central spinal stenosis (Severe at L4-5; L5-S1); Lumbar lateral recess stenosis (Bilateral L>R at L4-5; Left sided at L5-S1); Lumbar foraminal stenosis (Left L5-S1); Lumbar facet hypertrophy (L4-5 & L5-S1 L>R); Abnormal MRI, cervical spine (2014); Cervical foraminal stenosis (Right sided C3-4 & C4-5) (Left-sided C5-6 and C7-T1) (Bilateral C6-7); Splenomegaly; and Chronic pain syndrome on his problem list. His primarily concern today is the Back Pain (low)  Pain Assessment: Self-Reported Pain Score: 5 /10 Clinically the patient looks like a 2/10] Reported level is compatible with observation. Information on the proper use of the pain scale provided to the patient today Pain Type: Chronic pain Pain Location: Back Pain Orientation: Lower Pain Descriptors / Indicators: Aching, Sharp, Radiating Pain Frequency: Constant  Mr. Haven was last scheduled for an appointment on 10/05/16 for medication management. During today's appointment we reviewed John [name] chronic pain status, as well as his outpatient medication regimen. He has chronic low back pain that goes into the bottom of his feet. He denies radicular symptoms into his toes. He has denies numbness tingling or weakness. He has some numbness and tingling in his arms greater on the right. He has been diagnosed with Carpal Tunnel Syndrome. He uses a brace occasionally for his left wrist. He is concern that the Gabapentin is not as effective. He can not afford Lyrica. He has recently started back on his vitamin B12 secondary to low energy. He will restart  the magnesium daily.  He admits that he has constipation and is using OTC mediation which is effective.   The patient  reports that  he does not use drugs. His body mass index is 24.68 kg/m.  Further details on both, my assessment(s), as well as the proposed treatment plan, please see below.  Controlled Substance Pharmacotherapy Assessment REMS (Risk Evaluation and Mitigation Strategy)  Analgesic:Oxycodone 2.5 one every 12 hours (5 mg/day of oxycodone) MME/day:7.5 mg/day Zenovia Jarred, RN  12/30/2016  9:28 AM  Signed Nursing Pain Medication Assessment:  Safety precautions to be maintained throughout the outpatient stay will include: orient to surroundings, keep bed in low position, maintain call bell within reach at all times, provide assistance with transfer out of bed and ambulation.  Medication Inspection Compliance: Pill count conducted under aseptic conditions, in front of the patient. Neither the pills nor the bottle was removed from the patient's sight at any time. Once count was completed pills were immediately returned to the patient in their original bottle.  Medication: Oxycodone IR Pill/Patch Count: 20 of 30 pills remain Pill/Patch Appearance: Markings consistent with prescribed medication Bottle Appearance: Standard pharmacy container. Clearly labeled. Filled Date: 04 / 16 / 2018 Last Medication intake:  Today   Pharmacokinetics: Liberation and absorption (onset of action): WNL Distribution (time to peak effect): WNL Metabolism and excretion (duration of action): WNL         Pharmacodynamics: Desired effects: Analgesia: John [name] reports >50% benefit. Functional ability: Patient reports that medication allows him to accomplish basic ADLs Clinically meaningful improvement in function (CMIF): Sustained CMIF goals met Perceived effectiveness: Described as relatively effective, allowing for increase in activities of daily living (ADL) Undesirable effects: Side-effects or  Adverse reactions: Constipation Monitoring: [place] PMP: Online review of the past 9-monthperiod conducted. Compliant with practice rules and regulations List of all UDS test(s) done:  Lab Results  Component Value Date   TOXASSSELUR FINAL 01/21/2016   TOXASSSELUR FINAL 11/18/2015   TOXASSSELUR FINAL 09/22/2015   Last UDS on record: ToxAssure Select 13  Date Value Ref Range Status  01/21/2016 FINAL  Final    Comment:    ==================================================================== TOXASSURE SELECT 13 (MW) ==================================================================== Test                             Result       Flag       Units Drug Present and Declared for Prescription Verification   Oxycodone                      229          EXPECTED   ng/mg creat   Oxymorphone                    214          EXPECTED   ng/mg creat   Noroxycodone                   836          EXPECTED   ng/mg creat   Noroxymorphone                 110          EXPECTED   ng/mg creat    Sources of oxycodone are scheduled prescription medications.    Oxymorphone, noroxycodone, and noroxymorphone are expected    metabolites of oxycodone. Oxymorphone is also available as a    scheduled prescription medication. ==================================================================== Test  Result    Flag   Units      Ref Range   Creatinine              70               mg/dL      >=20 ==================================================================== Declared Medications:  The flagging and interpretation on this report are based on the  following declared medications.  Unexpected results may arise from  inaccuracies in the declared medications.  **Note: The testing scope of this panel includes these medications:  Oxycodone (Oxycodone Acetaminophen)  **Note: The testing scope of this panel does not include following  reported medications:  Acetaminophen (Oxycodone Acetaminophen)   Baclofen  Folic acid  Gabapentin  Lubiprostone  Omeprazole (Nexium)  Ranitidine ==================================================================== For clinical consultation, please call [phone]. ====================================================================    UDS interpretation: Compliant          Medication Assessment Form: Reviewed. Patient indicates being compliant with therapy Treatment compliance: Compliant Risk Assessment Profile: Aberrant behavior: See prior evaluations. None observed or detected today Comorbid factors increasing risk of overdose: See prior notes. No additional risks detected today Risk of substance use disorder (SUD): Low Opioid Risk Tool (ORT) Total Score: 7  Interpretation Table:  Score <3 = Low Risk for SUD  Score between 4-7 = Moderate Risk for SUD  Score >8 = High Risk for Opioid Abuse   Risk Mitigation Strategies:  Patient Counseling: Covered Patient-Prescriber Agreement (PPA): Present and active  Notification to other healthcare providers: Done  Pharmacologic Plan: No change in therapy, at this time  Laboratory Chemistry  Inflammation Markers Lab Results  Component Value Date   CRP 0.5 09/22/2015   ESRSEDRATE 15 09/22/2015   (CRP: Acute Phase) (ESR: Chronic Phase) Renal Function Markers Lab Results  Component Value Date   BUN 14 09/14/2016   CREATININE 0.62 09/14/2016   GFRAA >60 09/14/2016   GFRNONAA >60 09/14/2016   Hepatic Function Markers Lab Results  Component Value Date   AST 20 09/14/2016   ALT 13 (L) 09/14/2016   ALBUMIN 4.4 09/14/2016   ALKPHOS 64 09/14/2016   HCVAB <0.1 11/06/2015   Electrolytes Lab Results  Component Value Date   NA 136 09/14/2016   K 4.0 09/14/2016   CL 102 09/14/2016   CALCIUM 8.9 09/14/2016   MG 1.9 09/22/2015   Neuropathy Markers Lab Results  Component Value Date   VITAMINB12 594 08/08/2015   Bone Pathology Markers Lab Results  Component Value Date   ALKPHOS 64  09/14/2016   CALCIUM 8.9 09/14/2016   Coagulation Parameters Lab Results  Component Value Date   INR 1.08 09/14/2016   LABPROT 14.0 09/14/2016   APTT 33 08/08/2015   PLT 69 (L) 09/14/2016   Cardiovascular Markers Lab Results  Component Value Date   HGB 14.0 09/14/2016   HCT 41.1 09/14/2016   Note: Lab results reviewed.  Recent Diagnostic Imaging Review  US Abdomen Limited Ruq  Result Date: 09/17/2016 CLINICAL DATA:  Cirrhosis, splenomegaly EXAM: US ABDOMEN LIMITED - RIGHT UPPER QUADRANT COMPARISON:  None. FINDINGS: Gallbladder: Gallstones are noted within gallbladder the largest measures 5 mm. No sonographic Murphy's sign. No thickening of gallbladder wall Common bile duct: Diameter: 3.6 mm in diameter within normal limits. Liver: No focal hepatic mass. Again noted nodular contour and diffuse increased echogenicity of the liver consistent with cirrhosis. IMPRESSION: 1. Layering small gallstones are noted within gallbladder the largest measures 5 mm. No thickening of gallbladder wall. Normal CBD. No sonographic  Murphy's sign. 2. Again noted cirrhotic changes within liver. No focal solid hepatic mass. Electronically Signed   By: Lahoma Crocker M.D.   On: 09/17/2016 10:27   Note: Imaging results reviewed.          Meds  The patient has a current medication list which includes the following prescription(s): alprazolam, baclofen, folic acid, magnesium oxide, oxycodone, gabapentin, oxycodone, and oxycodone.  Current Outpatient Prescriptions on File Prior to Visit  Medication Sig  . ALPRAZolam (XANAX) 0.25 MG tablet Take 0.25 mg by mouth at bedtime as needed for anxiety.  . folic acid (FOLVITE) 1 MG tablet Take 1 mg by mouth daily.   . Magnesium Oxide 500 MG CAPS Take 1 capsule (500 mg total) by mouth 2 (two) times daily at 8 am and 10 pm.   No current facility-administered medications on file prior to visit.    ROS  Constitutional: Denies any fever or chills Gastrointestinal: No reported  hemesis, hematochezia, vomiting, or acute GI distress Musculoskeletal: Denies any acute onset joint swelling, redness, loss of ROM, or weakness Neurological: No reported episodes of acute onset apraxia, aphasia, dysarthria, agnosia, amnesia, paralysis, loss of coordination, or loss of consciousness  Allergies  Mr. Loe is allergic to codeine sulfate; morphine; and duloxetine.  [place]  Drug: Mr. Rothe  reports that he does not use drugs. Alcohol:  reports that he does not drink alcohol. Tobacco:  reports that he has been smoking Cigarettes.  He has a 17.50 pack-year smoking history. He has never used smokeless tobacco. Medical:  has a past medical history of Anxiety; Back ache (05/06/2014); Cervical pain (05/06/2014); COPD (chronic obstructive pulmonary disease) ([place]); Depression; Dystonia; Extremity pain (05/06/2014); Febrile seizures ([place]); Foot pain (09/11/2014); Gout; Head injury (06/23/2015); Hemorrhoids; Hepatitis B; History of GI bleed; Hypertension; Leukopenia; Osteoarthritis of spine with radiculopathy, lumbar region (06/23/2015); Peripheral neuropathy (06/23/2015); Personal history of tobacco use, presenting hazards to health (01/09/2016); Pulse irregularity; Seizures ([place]); Spinal stenosis; and Uncomplicated opioid dependence ([place]) (06/23/2015). Family: family history includes Cancer in his father; Cancer (age of onset: 10) in his brother; Heart disease in his father; Stroke in his father and mother.  Past Surgical History:  Procedure Laterality Date  . ESOPHAGOGASTRODUODENOSCOPY  11/2013  . surgery for cervical neck fracture    . TOOTH EXTRACTION Right 09/28/2016   Constitutional Exam  General appearance: Well nourished, well developed, and well hydrated. In no apparent acute distress Vitals:   12/30/16 0918 12/30/16 0920  BP:  (!) 106/54  Pulse: (!) 53   Resp: 18   Temp: 98.3 F (36.8 C)   SpO2: 100%   Weight: 182 lb (82.6 kg)   Height: 6' (1.829 m)    BMI Assessment: Estimated  body mass index is 24.68 kg/m as calculated from the following:   Height as of this encounter: 6' (1.829 m).   Weight as of this encounter: 182 lb (82.6 kg).  BMI interpretation table: BMI level Category Range association with higher incidence of chronic pain  <18 kg/m2 Underweight   18.5-24.9 kg/m2 Ideal body weight   25-29.9 kg/m2 Overweight Increased incidence by 20%  30-34.9 kg/m2 Obese (Class I) Increased incidence by 68%  35-39.9 kg/m2 Severe obesity (Class II) Increased incidence by 136%  >40 kg/m2 Extreme obesity (Class III) Increased incidence by 254%   BMI Readings from Last 4 Encounters:  12/30/16 24.68 kg/m  10/05/16 24.41 kg/m  09/14/16 25.27 kg/m  07/05/16 25.09 kg/m   Wt Readings from Last 4 Encounters:  12/30/16 182  lb (82.6 kg)  10/05/16 180 lb (81.6 kg)  09/14/16 186 lb 4.6 oz (84.5 kg)  07/05/16 185 lb (83.9 kg)  Psych/Mental status: Alert, oriented x 3 (person, place, & time)       Eyes: PERLA Respiratory: No evidence of acute respiratory distress  Cervical Spine Exam  Inspection: No masses, redness, or swelling Alignment: Symmetrical Functional ROM: Unrestricted ROM      Stability: No instability detected Muscle strength & Tone: Functionally intact Sensory: Unimpaired Palpation: No palpable anomalies              Upper Extremity (UE) Exam    Side: Right upper extremity  Side: Left upper extremity  Inspection: No masses, redness, swelling, or asymmetry. No contractures  Inspection: No masses, redness, swelling, or asymmetry. No contractures  Functional ROM: Unrestricted ROM          Functional ROM: Unrestricted ROM          Muscle strength & Tone: Functionally intact  Muscle strength & Tone: Functionally intact  Sensory: Unimpaired  Sensory: Unimpaired  Palpation: No palpable anomalies              Palpation: No palpable anomalies              Specialized Test(s): Deferred         Specialized Test(s): Deferred          Thoracic Spine Exam    Inspection: No masses, redness, or swelling Alignment: Symmetrical Functional ROM: Unrestricted ROM Stability: No instability detected Sensory: Unimpaired Muscle strength & Tone: No palpable anomalies  Lumbar Spine Exam  Inspection: No masses, redness, or swelling Alignment: Symmetrical Functional ROM: Unrestricted ROM      Stability: No instability detected Muscle strength & Tone: Functionally intact Sensory: Unimpaired Palpation: Complains of area being tender to palpation       Provocative Tests: Lumbar Hyperextension and rotation test: Positive bilaterally for facet joint pain. Patrick's Maneuver: evaluation deferred today                    Gait & Posture Assessment  Ambulation: Unassisted Gait: Ataxia (Poor voluntary coordination) Posture: WNL   Lower Extremity Exam    Side: Right lower extremity  Side: Left lower extremity  Inspection: No masses, redness, swelling, or asymmetry. No contractures  Inspection: No masses, redness, swelling, or asymmetry. No contractures  Functional ROM: Unrestricted ROM          Functional ROM: Unrestricted ROM          Muscle strength & Tone: Deconditioned  Muscle strength & Tone: Deconditioned  Sensory: Unimpaired  Sensory: Unimpaired  Palpation: Complains of area being tender to palpation  Palpation: Complains of area being tender to palpation   Assessment  Primary Diagnosis & Pertinent Problem List: The primary encounter diagnosis was Lumbar facet syndrome (Location of Primary Source of Pain) (Bilateral) (L>R). Diagnoses of Lumbar foraminal stenosis (Left L5-S1), Lumbar lateral recess stenosis (Bilateral L>R at L4-5; Left sided at L5-S1), Musculoskeletal pain, Chronic pain syndrome, and Long term current use of opiate analgesic were also pertinent to this visit.  Status Diagnosis  Controlled Controlled Controlled 1. Lumbar facet syndrome (Location of Primary Source of Pain) (Bilateral) (L>R)   2. Lumbar foraminal stenosis (Left L5-S1)    3. Lumbar lateral recess stenosis (Bilateral L>R at L4-5; Left sided at L5-S1)   4. Musculoskeletal pain   5. Chronic pain syndrome   6. Long term current use of opiate analgesic  Plan of Care  Pharmacotherapy (Medications Ordered): Meds ordered this encounter  Medications  . oxyCODONE (OXY IR/ROXICODONE) 5 MG immediate release tablet    Sig: Take 0.5 tablets (2.5 mg total) by mouth 2 (two) times daily.    Dispense:  30 tablet    Refill:  0    Do not place this medication, or any other prescription from our practice, on "Automatic Refill". Patient may have prescription filled one day early if pharmacy is closed on scheduled refill date. Do not fill until: 01/19/17 To last until: 02/18/17    Order Specific Question:   Supervising Provider    Answer:   Milinda Pointer [phone]  . oxyCODONE (OXY IR/ROXICODONE) 5 MG immediate release tablet    Sig: Take 0.5 tablets (2.5 mg total) by mouth 2 (two) times daily.    Dispense:  30 tablet    Refill:  0    Do not place this medication, or any other prescription from our practice, on "Automatic Refill". Patient may have prescription filled one day early if pharmacy is closed on scheduled refill date. Do not fill until: 02/18/17 To last until: 03/20/17    Order Specific Question:   Supervising Provider    Answer:   Milinda Pointer [phone]  . oxyCODONE (OXY IR/ROXICODONE) 5 MG immediate release tablet    Sig: Take 0.5 tablets (2.5 mg total) by mouth 2 (two) times daily.    Dispense:  30 tablet    Refill:  0    Do not place this medication, or any other prescription from our practice, on "Automatic Refill". Patient may have prescription filled one day early if pharmacy is closed on scheduled refill date. Do not fill until: 03/20/17 To last until: 04/19/17    Order Specific Question:   Supervising Provider    Answer:   Milinda Pointer [phone]  . baclofen (LIORESAL) 10 MG tablet    Sig: Take 1 tablet (10 mg total) by mouth 3  (three) times daily.    Dispense:  270 tablet    Refill:  0    Do not place this medication, or any other prescription from our practice, on "Automatic Refill". Patient may have prescription filled one day early if pharmacy is closed on scheduled refill date.    Order Specific Question:   Supervising Provider    Answer:   Milinda Pointer [phone]  . gabapentin (NEURONTIN) 300 MG capsule    Sig: Take 1 capsule (300 mg total) by mouth 2 (two) times daily.    Dispense:  180 capsule    Refill:  0    Order Specific Question:   Supervising Provider    Answer:   Milinda Pointer [phone]   New Prescriptions   No medications on file   Medications administered today: Mr. Postiglione had no medications administered during this visit. Lab-work, procedure(s), and/or referral(s): No orders of the defined types were placed in this encounter.  Imaging and/or referral(s): None  Interventional therapies: Planned, scheduled, and/or pending:   Not at this time.   Considering:   Diagnostic left L5-S1 lumbar epidural steroid injection under fluoroscopic guidance, with a without sedation. Diagnostic bilateral L4-5 transforaminal epidural steroid injection under fluoroscopic guidance, with or without sedation. Diagnostic bilateral lumbar facet block under fluoroscopic guidance and IV sedation. Diagnostic right-sided cervical epidural steroid injection under fluoroscopic guidance with or without sedation.    Palliative PRN treatment(s):   Not at this time.   Provider-requested follow-up: Return in about 3 months (around 03/31/2017) for Medication  Mgmt.  Future Appointments Date Time Provider [place]  03/14/2017 10:00 AM [facility] None  03/14/2017 10:15 AM Lequita Asal, MD [facility] None  03/31/2017 9:30 AM [place], NP [facility] None   Primary Care Physician: Cletis Athens, MD Location: [facility] Note by: Vevelyn Francois NP Date:  12/30/2016; Time: 11:16 AM  Pain Score Disclaimer: We use the NRS-11 scale. This is a self-reported, subjective measurement of pain severity with only modest accuracy. It is used primarily to identify changes within a particular patient. It must be understood that outpatient pain scales are significantly less accurate that those used for research, where they can be applied under ideal controlled circumstances with minimal exposure to variables. In reality, the score is likely to be a combination of pain intensity and pain affect, where pain affect describes the degree of emotional arousal or changes in action readiness caused by the sensory experience of pain. Factors such as social and work situation, setting, emotional state, anxiety levels, expectation, and prior pain experience may influence pain perception and show large inter-individual differences that may also be affected by time variables.  Patient instructions provided during this appointment: Patient Instructions   Steps to Quit Smoking Smoking tobacco can be bad for your health. It can also affect almost every organ in your body. Smoking puts you and people around you at risk for many serious long-lasting (chronic) diseases. Quitting smoking is hard, but it is one of the best things that you can do for your health. It is never too late to quit. What are the benefits of quitting smoking? When you quit smoking, you lower your risk for getting serious diseases and conditions. They can include:  Lung cancer or lung disease.  Heart disease.  Stroke.  Heart attack.  Not being able to have children (infertility).  Weak bones (osteoporosis) and broken bones (fractures). If you have coughing, wheezing, and shortness of breath, those symptoms may get better when you quit. You may also get sick less often. If you are pregnant, quitting smoking can help to lower your chances of having a baby of low birth weight. What can I do to help me quit  smoking? Talk with your doctor about what can help you quit smoking. Some things you can do (strategies) include:  Quitting smoking totally, instead of slowly cutting back how much you smoke over a period of time.  Going to in-person counseling. You are more likely to quit if you go to many counseling sessions.  Using resources and support systems, such as:  Online chats with a Social worker.  Phone quitlines.  Printed Furniture conservator/restorer.  Support groups or group counseling.  Text messaging programs.  Mobile phone apps or applications.  Taking medicines. Some of these medicines may have nicotine in them. If you are pregnant or breastfeeding, do not take any medicines to quit smoking unless your doctor says it is okay. Talk with your doctor about counseling or other things that can help you. Talk with your doctor about using more than one strategy at the same time, such as taking medicines while you are also going to in-person counseling. This can help make quitting easier. What things can I do to make it easier to quit? Quitting smoking might feel very hard at first, but there is a lot that you can do to make it easier. Take these steps:  Talk to your family and friends. Ask them to support and encourage you.  Call  phone quitlines, reach out to support groups, or work with a Social worker.  Ask people who smoke to not smoke around you.  Avoid places that make you want (trigger) to smoke, such as:  Bars.  Parties.  Smoke-break areas at work.  Spend time with people who do not smoke.  Lower the stress in your life. Stress can make you want to smoke. Try these things to help your stress:  Getting regular exercise.  Deep-breathing exercises.  Yoga.  Meditating.  Doing a body scan. To do this, close your eyes, focus on one area of your body at a time from head to toe, and notice which parts of your body are tense. Try to relax the muscles in those areas.  Download or buy  apps on your mobile phone or tablet that can help you stick to your quit plan. There are many free apps, such as QuitGuide from the [employer] for Disease Control and Prevention). You can find more support from smokefree.gov and other websites. This information is not intended to replace advice given to you by your health care provider. Make sure you discuss any questions you have with your health care provider. Document Released: 06/19/2009 Document Revised: 04/20/2016 Document Reviewed: 01/07/2015 Elsevier Interactive Patient Education  2017 [employer] Pain Score  Introduction: The pain score used by this practice is the Verbal Numerical Rating Scale (VNRS-11). This is an 11-point scale. It is for adults and children 10 years or older. There are significant differences in how the pain score is reported, used, and applied. Forget everything you learned in the past and learn this scoring system.  General Information: The scale should reflect your current level of pain. Unless you are specifically asked for the level of your worst pain, or your average pain. If you are asked for one of these two, then it should be understood that it is over the past 24 hours.  Basic Activities of Daily Living (ADL): Personal hygiene, dressing, eating, transferring, and using restroom.  Instructions: Most patients tend to report their level of pain as a combination of two factors, their physical pain and their psychosocial pain. This last one is also known as "suffering" and it is reflection of how physical pain affects you socially and psychologically. From now on, report them separately. From this point on, when asked to report your pain level, report only your physical pain. Use the following table for reference.  Pain Clinic Pain Levels (0-5/10)  Pain Level Score Description  No Pain 0   Mild pain 1 Nagging, annoying, but does not interfere with basic activities of daily living (ADL). Patients are able to  eat, bathe, get dressed, toileting (being able to get on and off the toilet and perform personal hygiene functions), transfer (move in and out of bed or a chair without assistance), and maintain continence (able to control bladder and bowel functions). Blood pressure and heart rate are unaffected. A normal heart rate for a healthy adult ranges from 60 to 100 bpm (beats per minute).   Mild to moderate pain 2 Noticeable and distracting. Impossible to hide from other people. More frequent flare-ups. Still possible to adapt and function close to normal. It can be very annoying and may have occasional stronger flare-ups. With discipline, patients may get used to it and adapt.   Moderate pain 3 Interferes significantly with activities of daily living (ADL). It becomes difficult to feed, bathe, get dressed, get on and off the toilet or to perform personal  hygiene functions. Difficult to get in and out of bed or a chair without assistance. Very distracting. With effort, it can be ignored when deeply involved in activities.   Moderately severe pain 4 Impossible to ignore for more than a few minutes. With effort, patients may still be able to manage work or participate in some social activities. Very difficult to concentrate. Signs of autonomic nervous system discharge are evident: dilated pupils (mydriasis); mild sweating (diaphoresis); sleep interference. Heart rate becomes elevated (>115 bpm). Diastolic blood pressure (lower number) rises above 100 mmHg. Patients find relief in laying down and not moving.   Severe pain 5 Intense and extremely unpleasant. Associated with frowning face and frequent crying. Pain overwhelms the senses.  Ability to do any activity or maintain social relationships becomes significantly limited. Conversation becomes difficult. Pacing back and forth is common, as getting into a comfortable position is nearly impossible. Pain wakes you up from deep sleep. Physical signs will be obvious:  pupillary dilation; increased sweating; goosebumps; brisk reflexes; cold, clammy hands and feet; nausea, vomiting or dry heaves; loss of appetite; significant sleep disturbance with inability to fall asleep or to remain asleep. When persistent, significant weight loss is observed due to the complete loss of appetite and sleep deprivation.  Blood pressure and heart rate becomes significantly elevated. Caution: If elevated blood pressure triggers a pounding headache associated with blurred vision, then the patient should immediately seek attention at an urgent or emergency care unit, as these may be signs of an impending stroke.    Emergency Department Pain Levels (6-10/10)  Emergency Room Pain 6 Severely limiting. Requires emergency care and should not be seen or managed at an outpatient pain management facility. Communication becomes difficult and requires great effort. Assistance to reach the emergency department may be required. Facial flushing and profuse sweating along with potentially dangerous increases in heart rate and blood pressure will be evident.   Distressing pain 7 Self-care is very difficult. Assistance is required to transport, or use restroom. Assistance to reach the emergency department will be required. Tasks requiring coordination, such as bathing and getting dressed become very difficult.   Disabling pain 8 Self-care is no longer possible. At this level, pain is disabling. The individual is unable to do even the most "basic" activities such as walking, eating, bathing, dressing, transferring to a bed, or toileting. Fine motor skills are lost. It is difficult to think clearly.   Incapacitating pain 9 Pain becomes incapacitating. Thought processing is no longer possible. Difficult to remember your own name. Control of movement and coordination are lost.   The worst pain imaginable 10 At this level, most patients pass out from pain. When this level is reached, collapse of the autonomic  nervous system occurs, leading to a sudden drop in blood pressure and heart rate. This in turn results in a temporary and dramatic drop in blood flow to the brain, leading to a loss of consciousness. Fainting is one of the body's self defense mechanisms. Passing out puts the brain in a calmed state and causes it to shut down for a while, in order to begin the healing process.    Summary: 1. Refer to this scale when providing [place] with your pain level. 2. Be accurate and careful when reporting your pain level. This will help with your care. 3. Over-reporting your pain level will lead to loss of credibility. 4. Even a level of 1/10 means that there is pain and will be treated at our facility. 5.  High, inaccurate reporting will be documented as "Symptom Exaggeration", leading to loss of credibility and suspicions of possible secondary gains such as obtaining more narcotics, or wanting to appear disabled, for fraudulent reasons. 6. Only pain levels of 5 or below will be seen at our facility. 7. Pain levels of 6 and above will be sent to the Emergency Department and the appointment cancelled. _____________________________________________________________________________________________

## 2016-12-30 NOTE — Patient Instructions (Addendum)
Steps to Quit Smoking Smoking tobacco can be bad for your health. It can also affect almost every organ in your body. Smoking puts you and people around you at risk for many serious long-lasting (chronic) diseases. Quitting smoking is hard, but it is one of the best things that you can do for your health. It is never too late to quit. What are the benefits of quitting smoking? When you quit smoking, you lower your risk for getting serious diseases and conditions. They can include:  Lung cancer or lung disease.  Heart disease.  Stroke.  Heart attack.  Not being able to have children (infertility).  Weak bones (osteoporosis) and broken bones (fractures). If you have coughing, wheezing, and shortness of breath, those symptoms may get better when you quit. You may also get sick less often. If you are pregnant, quitting smoking can help to lower your chances of having a baby of low birth weight. What can I do to help me quit smoking? Talk with your doctor about what can help you quit smoking. Some things you can do (strategies) include:  Quitting smoking totally, instead of slowly cutting back how much you smoke over a period of time.  Going to in-person counseling. You are more likely to quit if you go to many counseling sessions.  Using resources and support systems, such as:  Online chats with a counselor.  Phone quitlines.  Printed self-help materials.  Support groups or group counseling.  Text messaging programs.  Mobile phone apps or applications.  Taking medicines. Some of these medicines may have nicotine in them. If you are pregnant or breastfeeding, do not take any medicines to quit smoking unless your doctor says it is okay. Talk with your doctor about counseling or other things that can help you. Talk with your doctor about using more than one strategy at the same time, such as taking medicines while you are also going to in-person counseling. This can help make quitting  easier. What things can I do to make it easier to quit? Quitting smoking might feel very hard at first, but there is a lot that you can do to make it easier. Take these steps:  Talk to your family and friends. Ask them to support and encourage you.  Call phone quitlines, reach out to support groups, or work with a counselor.  Ask people who smoke to not smoke around you.  Avoid places that make you want (trigger) to smoke, such as:  Bars.  Parties.  Smoke-break areas at work.  Spend time with people who do not smoke.  Lower the stress in your life. Stress can make you want to smoke. Try these things to help your stress:  Getting regular exercise.  Deep-breathing exercises.  Yoga.  Meditating.  Doing a body scan. To do this, close your eyes, focus on one area of your body at a time from head to toe, and notice which parts of your body are tense. Try to relax the muscles in those areas.  Download or buy apps on your mobile phone or tablet that can help you stick to your quit plan. There are many free apps, such as QuitGuide from the CDC (Centers for Disease Control and Prevention). You can find more support from smokefree.gov and other websites. This information is not intended to replace advice given to you by your health care provider. Make sure you discuss any questions you have with your health care provider. Document Released: 06/19/2009 Document Revised: 04/20/2016 Document   Reviewed: 01/07/2015 Elsevier Interactive Patient Education  2017 [employer]. Pain Score  Introduction: The pain score used by this practice is the Verbal Numerical Rating Scale (VNRS-11). This is an 11-point scale. It is for adults and children 10 years or older. There are significant differences in how the pain score is reported, used, and applied. Forget everything you learned in the past and learn this scoring system.  General Information: The scale should reflect your current level of pain.  Unless you are specifically asked for the level of your worst pain, or your average pain. If you are asked for one of these two, then it should be understood that it is over the past 24 hours.  Basic Activities of Daily Living (ADL): Personal hygiene, dressing, eating, transferring, and using restroom.  Instructions: Most patients tend to report their level of pain as a combination of two factors, their physical pain and their psychosocial pain. This last one is also known as "suffering" and it is reflection of how physical pain affects you socially and psychologically. From now on, report them separately. From this point on, when asked to report your pain level, report only your physical pain. Use the following table for reference.  Pain Clinic Pain Levels (0-5/10)  Pain Level Score Description  No Pain 0   Mild pain 1 Nagging, annoying, but does not interfere with basic activities of daily living (ADL). Patients are able to eat, bathe, get dressed, toileting (being able to get on and off the toilet and perform personal hygiene functions), transfer (move in and out of bed or a chair without assistance), and maintain continence (able to control bladder and bowel functions). Blood pressure and heart rate are unaffected. A normal heart rate for a healthy adult ranges from 60 to 100 bpm (beats per minute).   Mild to moderate pain 2 Noticeable and distracting. Impossible to hide from other people. More frequent flare-ups. Still possible to adapt and function close to normal. It can be very annoying and may have occasional stronger flare-ups. With discipline, patients may get used to it and adapt.   Moderate pain 3 Interferes significantly with activities of daily living (ADL). It becomes difficult to feed, bathe, get dressed, get on and off the toilet or to perform personal hygiene functions. Difficult to get in and out of bed or a chair without assistance. Very distracting. With effort, it can be ignored  when deeply involved in activities.   Moderately severe pain 4 Impossible to ignore for more than a few minutes. With effort, patients may still be able to manage work or participate in some social activities. Very difficult to concentrate. Signs of autonomic nervous system discharge are evident: dilated pupils (mydriasis); mild sweating (diaphoresis); sleep interference. Heart rate becomes elevated (>115 bpm). Diastolic blood pressure (lower number) rises above 100 mmHg. Patients find relief in laying down and not moving.   Severe pain 5 Intense and extremely unpleasant. Associated with frowning face and frequent crying. Pain overwhelms the senses.  Ability to do any activity or maintain social relationships becomes significantly limited. Conversation becomes difficult. Pacing back and forth is common, as getting into a comfortable position is nearly impossible. Pain wakes you up from deep sleep. Physical signs will be obvious: pupillary dilation; increased sweating; goosebumps; brisk reflexes; cold, clammy hands and feet; nausea, vomiting or dry heaves; loss of appetite; significant sleep disturbance with inability to fall asleep or to remain asleep. When persistent, significant weight loss is observed due to the complete  loss of appetite and sleep deprivation.  Blood pressure and heart rate becomes significantly elevated. Caution: If elevated blood pressure triggers a pounding headache associated with blurred vision, then the patient should immediately seek attention at an urgent or emergency care unit, as these may be signs of an impending stroke.    Emergency Department Pain Levels (6-10/10)  Emergency Room Pain 6 Severely limiting. Requires emergency care and should not be seen or managed at an outpatient pain management facility. Communication becomes difficult and requires great effort. Assistance to reach the emergency department may be required. Facial flushing and profuse sweating along with  potentially dangerous increases in heart rate and blood pressure will be evident.   Distressing pain 7 Self-care is very difficult. Assistance is required to transport, or use restroom. Assistance to reach the emergency department will be required. Tasks requiring coordination, such as bathing and getting dressed become very difficult.   Disabling pain 8 Self-care is no longer possible. At this level, pain is disabling. The individual is unable to do even the most "basic" activities such as walking, eating, bathing, dressing, transferring to a bed, or toileting. Fine motor skills are lost. It is difficult to think clearly.   Incapacitating pain 9 Pain becomes incapacitating. Thought processing is no longer possible. Difficult to remember your own name. Control of movement and coordination are lost.   The worst pain imaginable 10 At this level, most patients pass out from pain. When this level is reached, collapse of the autonomic nervous system occurs, leading to a sudden drop in blood pressure and heart rate. This in turn results in a temporary and dramatic drop in blood flow to the brain, leading to a loss of consciousness. Fainting is one of the body's self defense mechanisms. Passing out puts the brain in a calmed state and causes it to shut down for a while, in order to begin the healing process.    Summary: 1. Refer to this scale when providing [place] with your pain level. 2. Be accurate and careful when reporting your pain level. This will help with your care. 3. Over-reporting your pain level will lead to loss of credibility. 4. Even a level of 1/10 means that there is pain and will be treated at our facility. 5. High, inaccurate reporting will be documented as "Symptom Exaggeration", leading to loss of credibility and suspicions of possible secondary gains such as obtaining more narcotics, or wanting to appear disabled, for fraudulent reasons. 6. Only pain levels of 5 or below will be seen at  our facility. 7. Pain levels of 6 and above will be sent to the Emergency Department and the appointment cancelled. _____________________________________________________________________________________________

## 2017-01-04 ENCOUNTER — Telehealth: Payer: Self-pay | Admitting: *Deleted

## 2017-01-04 NOTE — Telephone Encounter (Signed)
Left voicemail for patient notifyng them that it is time to schedule annual low dose lung cancer screening CT scan. Instructed patient to call back to verify information prior to the scan being scheduled.  

## 2017-01-12 ENCOUNTER — Telehealth: Payer: Self-pay | Admitting: *Deleted

## 2017-01-12 NOTE — Telephone Encounter (Signed)
Left message for patient to notify them that it is time to schedule annual low dose lung cancer screening CT scan. Instructed patient to call back to verify information prior to the scan being scheduled.  

## 2017-01-13 ENCOUNTER — Telehealth: Payer: Self-pay | Admitting: *Deleted

## 2017-01-13 DIAGNOSIS — Z87891 Personal history of nicotine dependence: Secondary | ICD-10-CM

## 2017-01-13 NOTE — Telephone Encounter (Signed)
Notified patient that annual lung cancer screening low dose CT scan is due currently or will be in near future. Confirmed that patient is within the age range of 55-77, and asymptomatic, (no signs or symptoms of lung cancer). Patient denies illness that would prevent curative treatment for lung cancer if found. Verified smoking history, (current,45 pack year). The shared decision making visit was done 08/07/14. Patient is agreeable for CT scan being scheduled.

## 2017-01-21 ENCOUNTER — Ambulatory Visit: Admission: RE | Admit: 2017-01-21 | Payer: Medicare PPO | Source: Ambulatory Visit

## 2017-01-24 ENCOUNTER — Ambulatory Visit
Admission: RE | Admit: 2017-01-24 | Discharge: 2017-01-24 | Disposition: A | Discharge status: Home / Self Care | Payer: Medicare PPO | Source: Ambulatory Visit | Attending: Oncology | Admitting: Oncology

## 2017-01-24 DIAGNOSIS — I700 Atherosclerosis of aorta: Secondary | ICD-10-CM | POA: Insufficient documentation

## 2017-01-24 DIAGNOSIS — I2510 Atherosclerotic heart disease of native coronary artery without angina pectoris: Secondary | ICD-10-CM | POA: Insufficient documentation

## 2017-01-24 DIAGNOSIS — K8020 Calculus of gallbladder without cholecystitis without obstruction: Secondary | ICD-10-CM | POA: Diagnosis not present

## 2017-01-24 DIAGNOSIS — F17210 Nicotine dependence, cigarettes, uncomplicated: Secondary | ICD-10-CM | POA: Diagnosis present

## 2017-01-24 DIAGNOSIS — I358 Other nonrheumatic aortic valve disorders: Secondary | ICD-10-CM | POA: Diagnosis not present

## 2017-01-24 DIAGNOSIS — Z87891 Personal history of nicotine dependence: Secondary | ICD-10-CM

## 2017-02-03 ENCOUNTER — Telehealth: Payer: Self-pay | Admitting: *Deleted

## 2017-02-03 NOTE — Telephone Encounter (Signed)
Notified patient of LDCT lung cancer screening program results with recommendation for 6 month follow up imaging. Also notified of incidental findings noted below and is encouraged to discuss further with PCP who will receive a copy of this note and/or the CT report. Patient verbalizes understanding.

## 2017-03-14 ENCOUNTER — Other Ambulatory Visit: Payer: Self-pay

## 2017-03-14 ENCOUNTER — Ambulatory Visit: Payer: Self-pay | Admitting: Hematology and Oncology

## 2017-03-31 ENCOUNTER — Ambulatory Visit: Payer: Medicare PPO | Attending: Nurse Practitioner | Admitting: Nurse Practitioner

## 2017-03-31 ENCOUNTER — Encounter: Payer: Self-pay | Admitting: Nurse Practitioner

## 2017-03-31 VITALS — BP 120/54 | HR 58 | Temp 98.0°F | Resp 14 | Ht 72.0 in | Wt 185.0 lb

## 2017-03-31 DIAGNOSIS — G4733 Obstructive sleep apnea (adult) (pediatric): Secondary | ICD-10-CM | POA: Insufficient documentation

## 2017-03-31 DIAGNOSIS — M109 Gout, unspecified: Secondary | ICD-10-CM | POA: Diagnosis not present

## 2017-03-31 DIAGNOSIS — I10 Essential (primary) hypertension: Secondary | ICD-10-CM | POA: Insufficient documentation

## 2017-03-31 DIAGNOSIS — F17210 Nicotine dependence, cigarettes, uncomplicated: Secondary | ICD-10-CM | POA: Insufficient documentation

## 2017-03-31 DIAGNOSIS — G5603 Carpal tunnel syndrome, bilateral upper limbs: Secondary | ICD-10-CM | POA: Diagnosis not present

## 2017-03-31 DIAGNOSIS — R262 Difficulty in walking, not elsewhere classified: Secondary | ICD-10-CM | POA: Diagnosis not present

## 2017-03-31 DIAGNOSIS — Z79899 Other long term (current) drug therapy: Secondary | ICD-10-CM | POA: Diagnosis not present

## 2017-03-31 DIAGNOSIS — J449 Chronic obstructive pulmonary disease, unspecified: Secondary | ICD-10-CM | POA: Insufficient documentation

## 2017-03-31 DIAGNOSIS — M7918 Myalgia, other site: Secondary | ICD-10-CM

## 2017-03-31 DIAGNOSIS — Z888 Allergy status to other drugs, medicaments and biological substances status: Secondary | ICD-10-CM | POA: Diagnosis not present

## 2017-03-31 DIAGNOSIS — Z885 Allergy status to narcotic agent status: Secondary | ICD-10-CM | POA: Diagnosis not present

## 2017-03-31 DIAGNOSIS — F419 Anxiety disorder, unspecified: Secondary | ICD-10-CM | POA: Diagnosis not present

## 2017-03-31 DIAGNOSIS — G621 Alcoholic polyneuropathy: Secondary | ICD-10-CM | POA: Diagnosis not present

## 2017-03-31 DIAGNOSIS — M791 Myalgia: Secondary | ICD-10-CM | POA: Diagnosis not present

## 2017-03-31 DIAGNOSIS — K219 Gastro-esophageal reflux disease without esophagitis: Secondary | ICD-10-CM | POA: Insufficient documentation

## 2017-03-31 DIAGNOSIS — K5909 Other constipation: Secondary | ICD-10-CM | POA: Diagnosis not present

## 2017-03-31 DIAGNOSIS — F329 Major depressive disorder, single episode, unspecified: Secondary | ICD-10-CM | POA: Insufficient documentation

## 2017-03-31 DIAGNOSIS — D696 Thrombocytopenia, unspecified: Secondary | ICD-10-CM | POA: Insufficient documentation

## 2017-03-31 DIAGNOSIS — G40909 Epilepsy, unspecified, not intractable, without status epilepticus: Secondary | ICD-10-CM | POA: Insufficient documentation

## 2017-03-31 DIAGNOSIS — M47816 Spondylosis without myelopathy or radiculopathy, lumbar region: Secondary | ICD-10-CM

## 2017-03-31 DIAGNOSIS — G894 Chronic pain syndrome: Secondary | ICD-10-CM | POA: Diagnosis not present

## 2017-03-31 DIAGNOSIS — S0990XA Unspecified injury of head, initial encounter: Secondary | ICD-10-CM | POA: Diagnosis not present

## 2017-03-31 DIAGNOSIS — K860 Alcohol-induced chronic pancreatitis: Secondary | ICD-10-CM | POA: Diagnosis not present

## 2017-03-31 DIAGNOSIS — M4802 Spinal stenosis, cervical region: Secondary | ICD-10-CM | POA: Insufficient documentation

## 2017-03-31 DIAGNOSIS — Z79891 Long term (current) use of opiate analgesic: Secondary | ICD-10-CM

## 2017-03-31 DIAGNOSIS — M48061 Spinal stenosis, lumbar region without neurogenic claudication: Secondary | ICD-10-CM

## 2017-03-31 MED ORDER — GABAPENTIN 300 MG PO CAPS: 300.0000 mg | ORAL_CAPSULE | Freq: Two times a day (BID) | ORAL | 0 refills | Status: DC

## 2017-03-31 MED ORDER — OXYCODONE HCL 5 MG PO TABS: 2.5000 mg | ORAL_TABLET | Freq: Two times a day (BID) | ORAL | 0 refills | Status: DC

## 2017-03-31 MED ORDER — MAGNESIUM OXIDE -MG SUPPLEMENT 500 MG PO CAPS: 1.0000 | ORAL_CAPSULE | Freq: Two times a day (BID) | ORAL | 0 refills | Status: DC

## 2017-03-31 MED ORDER — NALOXEGOL OXALATE 25 MG PO TABS: 25.0000 mg | ORAL_TABLET | Freq: Every day | ORAL | 0 refills | Status: AC

## 2017-03-31 MED ORDER — BACLOFEN 10 MG PO TABS: 10.0000 mg | ORAL_TABLET | Freq: Three times a day (TID) | ORAL | 0 refills | Status: DC

## 2017-03-31 NOTE — Progress Notes (Signed)
Patient's Name: John [name]  MRN: [id number]  Referring Provider: Cletis Athens, MD  DOB: Apr 27, 1956  PCP: Cletis Athens, MD  DOS: 03/31/2017  Note by: Vevelyn Francois NP  Service setting: Ambulatory outpatient  Specialty: Interventional Pain Management  Location: [facility] (AMB) Pain Management Facility    Patient type: Established    Primary Reason(s) for Visit: Encounter for prescription drug management. (Level of risk: moderate)  CC: Back Pain (lower)  HPI  John [name] is a 61 y.o. year old, male patient, who comes today for a medication management evaluation. He has Encounter for therapeutic drug level monitoring; Long term current use of opiate analgesic; Opiate use (7.5 MME/Day); Lumbar spondylosis; Chronic low back pain (Location of Primary Source of Pain) (Bilateral) (L>R); IVP/radiological dye Allergy; Thrombocytopenia ([place]); History of alcoholism; Carpal tunnel syndrome; Clinical depression; Difficulty in walking; Dystonia; Gastric ulcer; HCV (hepatitis C virus); H/O neoplasm; BP (high blood pressure); Decreased leukocytes; Hepatic cirrhosis ([place]); Loss of feeling or sensation; Absence of sensation; Current tobacco use; Has a tremor; Osteoarthritis of spine with radiculopathy, lumbosacral region; Peripheral neuropathy (Alcoh[place]); Chronic neck pain (Location of Tertiary source of pain) (Bilateral) (R>L); Cervical spondylosis; Obstructive sleep apnea; History of panic attacks; GERD (gastroesophageal reflux disease); Chronic constipation; 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[place]); Leukopenia; Lumbar facet syndrome (Location of Primary Source of Pain) (Bilateral) (L>R); Chronic lumbar radicular pain (Right S1 and Left L5) (Location of Secondary source of pain) (Bilateral) (L>R); Cervical radicular pain (Location of Tertiary source of pain) (Bilateral) (R>L); Chronic obstructive pulmonary disease (COPD) ([place]); Head injury; Epileptic disorder ([place]); Long term prescription opiate use; Encounter for chronic  pain management; Radicular pain of shoulder (Bilateral) (R>L); Opioid-induced constipation ([place]); Personal history of tobacco use, presenting hazards to health; Neurogenic pain; Musculoskeletal pain; Chronic lower extremity pain (Location of Secondary source of pain) (Bilateral) (L>R); Chronic upper extremity pain (Location of Tertiary source of pain) (Bilateral) (R>L); Abnormal MRI, lumbar spine (2015); Lumbar central spinal stenosis (Severe at L4-5; L5-S1); Lumbar lateral recess stenosis (Bilateral L>R at L4-5; Left sided at L5-S1); Lumbar foraminal stenosis (Left L5-S1); Lumbar facet hypertrophy (L4-5 & L5-S1 L>R); Abnormal MRI, cervical spine (2014); Cervical foraminal stenosis (Right sided C3-4 & C4-5) (Left-sided C5-6 and C7-T1) (Bilateral C6-7); Splenomegaly; and Chronic pain syndrome on his problem list. His primarily concern today is the Back Pain (lower)  Pain Assessment: Location: Lower Back Radiating: down both legs Onset: More than a month ago Duration: Chronic pain Quality: Larence Penning Severity: 5 /10 (self-reported pain score)  Note: Reported level is compatible with observation.                   Effect on ADL:   Timing: Constant Modifying factors: lying down  John [name] was last scheduled for an appointment on 12/30/2016 for medication management. During today's appointment we reviewed John [name] chronic pain status, as well as his outpatient medication regimen. He admits that he has numbness and tingling into his knees. He admits that he has foot pain he is followed by podiatry for this. He states he wonders if the Gabapentin is effective. He has changed the dose some but did not notice any additional relief. He admits that he could not afford Lyrica.  He has started taking vit B12 and the Magnesium for cramps and energy. He is taking OTC laxative.    The patient  reports that he does not use drugs. His body mass index is 25.09 kg/m.  Further details on both, my  assessment(s), as well  as the proposed treatment plan, please see below.  Controlled Substance Pharmacotherapy Assessment REMS (Risk Evaluation and Mitigation Strategy)  Analgesic:Oxycodone 2.5 one every 12 hours (5 mg/day of oxycodone) MME/day:7.5 mg/day John Martins, RN  03/31/2017  9:15 AM  Sign at close encounter Nursing Pain Medication Assessment:  Safety precautions to be maintained throughout the outpatient stay will include: orient to surroundings, keep bed in low position, maintain call bell within reach at all times, provide assistance with transfer out of bed and ambulation.  Medication Inspection Compliance: Pill count conducted under aseptic conditions, in front of the patient. Neither the pills nor the bottle was removed from the patient's sight at any time. Once count was completed pills were immediately returned to the patient in their original bottle.  Medication: Oxycodone IR Pill/Patch Count: 20 of 30 pills remain Pill/Patch Appearance: Markings consistent with prescribed medication Bottle Appearance: Standard pharmacy container. Clearly labeled. Filled Date: 07/16/ 2018 Last Medication intake:  Today   Pharmacokinetics: Liberation and absorption (onset of action): WNL Distribution (time to peak effect): WNL Metabolism and excretion (duration of action): WNL         Pharmacodynamics: Desired effects: Analgesia: John [name] reports >50% benefit. Functional ability: Patient reports that medication allows him to accomplish basic ADLs Clinically meaningful improvement in function (CMIF): Sustained CMIF goals met Perceived effectiveness: Described as relatively effective, allowing for increase in activities of daily living (ADL) Undesirable effects: Side-effects or Adverse reactions: None reported Monitoring: [place] PMP: Online review of the past 19-monthperiod conducted. Compliant with practice rules and regulations List of all UDS test(s) done:  Lab Results   Component Value Date   TOXASSSELUR FINAL 01/21/2016   TOXASSSELUR FINAL 11/18/2015   TOXASSSELUR FINAL 09/22/2015   Last UDS on record: ToxAssure Select 13  Date Value Ref Range Status  01/21/2016 FINAL  Final    Comment:    ==================================================================== TOXASSURE SELECT 13 (MW) ==================================================================== Test                             Result       Flag       Units Drug Present and Declared for Prescription Verification   Oxycodone                      229          EXPECTED   ng/mg creat   Oxymorphone                    214          EXPECTED   ng/mg creat   Noroxycodone                   836          EXPECTED   ng/mg creat   Noroxymorphone                 110          EXPECTED   ng/mg creat    Sources of oxycodone are scheduled prescription medications.    Oxymorphone, noroxycodone, and noroxymorphone are expected    metabolites of oxycodone. Oxymorphone is also available as a    scheduled prescription medication. ==================================================================== Test                      Result    Flag   Units  Ref Range   Creatinine              70               mg/dL      >=20 ==================================================================== Declared Medications:  The flagging and interpretation on this report are based on the  following declared medications.  Unexpected results may arise from  inaccuracies in the declared medications.  **Note: The testing scope of this panel includes these medications:  Oxycodone (Oxycodone Acetaminophen)  **Note: The testing scope of this panel does not include following  reported medications:  Acetaminophen (Oxycodone Acetaminophen)  Baclofen  Folic acid  Gabapentin  Lubiprostone  Omeprazole (Nexium)  Ranitidine ==================================================================== For clinical consultation, please call [phone]. ====================================================================    UDS interpretation: Compliant          Medication Assessment Form: Reviewed. Patient indicates being compliant with therapy Treatment compliance: Compliant Risk Assessment Profile: Aberrant behavior: See prior evaluations. None observed or detected today Comorbid factors increasing risk of overdose: See prior notes. No additional risks detected today Risk of substance use disorder (SUD): Low Opioid Risk Tool (ORT) Total Score: 3  Interpretation Table:  Score <3 = Low Risk for SUD  Score between 4-7 = Moderate Risk for SUD  Score >8 = High Risk for Opioid Abuse   Risk Mitigation Strategies:  Patient Counseling: Covered Patient-Prescriber Agreement (PPA): Present and active  Notification to other healthcare providers: Done  Pharmacologic Plan: No change in therapy, at this time  Laboratory Chemistry  Inflammation Markers (CRP: Acute Phase) (ESR: Chronic Phase) Lab Results  Component Value Date   CRP 0.5 09/22/2015   ESRSEDRATE 15 09/22/2015                 Renal Function Markers Lab Results  Component Value Date   BUN 14 09/14/2016   CREATININE 0.62 09/14/2016   GFRAA >60 09/14/2016   GFRNONAA >60 09/14/2016                 Hepatic Function Markers Lab Results  Component Value Date   AST 20 09/14/2016   ALT 13 (L) 09/14/2016   ALBUMIN 4.4 09/14/2016   ALKPHOS 64 09/14/2016   HCVAB <0.1 11/06/2015                 Electrolytes Lab Results  Component Value Date   NA 136 09/14/2016   K 4.0 09/14/2016   CL 102 09/14/2016   CALCIUM 8.9 09/14/2016   MG 1.9 09/22/2015                 Neuropathy Markers Lab Results  Component Value Date   VITAMINB12 594 08/08/2015                 Bone Pathology Markers Lab Results  Component Value Date   ALKPHOS 64 09/14/2016   CALCIUM 8.9 09/14/2016                 Coagulation Parameters Lab Results  Component Value Date   INR 1.08  09/14/2016   LABPROT 14.0 09/14/2016   APTT 33 08/08/2015   PLT 69 (L) 09/14/2016                 Cardiovascular Markers Lab Results  Component Value Date   HGB 14.0 09/14/2016   HCT 41.1 09/14/2016                 Note: Lab results reviewed.  Recent Diagnostic Imaging Review  Ct Chest Lung Cancer Screening Low Dose Wo Contrast  Result Date: 01/24/2017 CLINICAL DATA:  61 year old male current smoker with 45 pack-year history of smoking. Lung cancer screening examination. EXAM: CT CHEST WITHOUT CONTRAST LOW-DOSE FOR LUNG CANCER SCREENING TECHNIQUE: Multidetector CT imaging of the chest was performed following the standard protocol without IV contrast. COMPARISON:  Low-dose lung cancer screening chest CT 01/20/2016. FINDINGS: Cardiovascular: Heart size is normal. There is no significant pericardial fluid, thickening or pericardial calcification. There is aortic atherosclerosis, as well as atherosclerosis of the great vessels of the mediastinum and the coronary arteries, including calcified atherosclerotic plaque in the right coronary artery. Calcifications of the aortic valve. Mediastinum/Nodes: No pathologically enlarged mediastinal or hilar lymph nodes. Please note that accurate exclusion of hilar adenopathy is limited on noncontrast CT scans. Esophagus is unremarkable in appearance. No axillary lymphadenopathy. Lungs/Pleura: New nodule in the apex of the left upper lobe (axial image 46 of series 3), with a volume derived mean diameter of 5.5 mm. No other larger more suspicious appearing pulmonary nodules or masses. No acute consolidative airspace disease. No pleural effusions. Mild diffuse bronchial wall thickening with mild centrilobular and paraseptal emphysema. Upper Abdomen: Calcified gallstones lying dependently in the gallbladder. 1.5 cm low-attenuation lesion in superior aspect of the right lobe of the liver is incompletely characterized on today's noncontrast CT examination, but is similar  to the prior study, presumably a small cyst. Aortic atherosclerosis. Musculoskeletal: There are no aggressive appearing lytic or blastic lesions noted in the visualized portions of the skeleton. IMPRESSION: 1. Lung-RADS 3S, probably benign findings. Short-term follow-up in 6 months is recommended with repeat low-dose chest CT without contrast (please use the following order, "CT CHEST LCS NODULE FOLLOW-UP W/O CM"). 2. The "S" modifier above refers to potentially clinically significant non lung cancer related findings. Specifically, there is aortic atherosclerosis, in addition to right coronary artery disease. Please note that although the presence of coronary artery calcium documents the presence of coronary artery disease, the severity of this disease and any potential stenosis cannot be assessed on this non-gated CT examination. Assessment for potential risk factor modification, dietary therapy or pharmacologic therapy may be warranted, if clinically indicated. 3. There are calcifications of the aortic valve. Echocardiographic correlation for evaluation of potential valvular dysfunction may be warranted if clinically indicated. 4. Cholelithiasis. Electronically Signed   By: Vinnie Langton M.D.   On: 01/24/2017 11:51   Note: Imaging results reviewed.          Meds   Current Meds  Medication Sig  . ALPRAZolam (XANAX) 0.25 MG tablet Take 0.25 mg by mouth at bedtime as needed for anxiety.  Derrill Memo ON 04/19/2017] baclofen (LIORESAL) 10 MG tablet Take 1 tablet (10 mg total) by mouth 3 (three) times daily.  . folic acid (FOLVITE) 1 MG tablet Take 1 mg by mouth daily.   Derrill Memo ON 04/19/2017] gabapentin (NEURONTIN) 300 MG capsule Take 1 capsule (300 mg total) by mouth 2 (two) times daily.  . Magnesium 500 MG CAPS Take by mouth 2 (two) times daily.  Derrill Memo ON 04/19/2017] oxyCODONE (OXY IR/ROXICODONE) 5 MG immediate release tablet Take 0.5 tablets (2.5 mg total) by mouth 2 (two) times daily.  . vitamin B-12  (CYANOCOBALAMIN) 500 MCG tablet Take 500 mcg by mouth daily.  . [DISCONTINUED] baclofen (LIORESAL) 10 MG tablet Take 1 tablet (10 mg total) by mouth 3 (three) times daily.  . [DISCONTINUED] gabapentin (NEURONTIN) 300 MG capsule Take 1 capsule (300 mg total) by  mouth 2 (two) times daily.  . [DISCONTINUED] oxyCODONE (OXY IR/ROXICODONE) 5 MG immediate release tablet Take 0.5 tablets (2.5 mg total) by mouth 2 (two) times daily.    ROS  Constitutional: Denies any fever or chills Gastrointestinal: No reported hemesis, hematochezia, vomiting, or acute GI distress Musculoskeletal: Denies any acute onset joint swelling, redness, loss of ROM, or weakness Neurological: No reported episodes of acute onset apraxia, aphasia, dysarthria, agnosia, amnesia, paralysis, loss of coordination, or loss of consciousness  Allergies  Mr. Steffenhagen is allergic to codeine sulfate; morphine; and duloxetine.  [place]  Drug: Mr. Falwell  reports that he does not use drugs. Alcohol:  reports that he does not drink alcohol. Tobacco:  reports that he has been smoking Cigarettes.  He has a 17.50 pack-year smoking history. He has never used smokeless tobacco. Medical:  has a past medical history of Anxiety; Back ache (05/06/2014); Cervical pain (05/06/2014); COPD (chronic obstructive pulmonary disease) ([place]); Depression; Dystonia; Extremity pain (05/06/2014); Febrile seizures ([place]); Foot pain (09/11/2014); Gout; Head injury (06/23/2015); Hemorrhoids; Hepatitis B; History of GI bleed; Hypertension; Leukopenia; Osteoarthritis of spine with radiculopathy, lumbar region (06/23/2015); Peripheral neuropathy (06/23/2015); Personal history of tobacco use, presenting hazards to health (01/09/2016); Pulse irregularity; Seizures ([place]); Spinal stenosis; and Uncomplicated opioid dependence ([place]) (06/23/2015). Surgical: Mr. Brisendine  has a past surgical history that includes surgery for cervical neck fracture; Esophagogastroduodenoscopy (11/2013); and Tooth  Extraction (Right, 09/28/2016). Family: family history includes Cancer in his father; Cancer (age of onset: 70) in his brother; Heart disease in his father; Stroke in his father and mother.  Constitutional Exam  General appearance: Well nourished, well developed, and well hydrated. In no apparent acute distress Vitals:   03/31/17 0908  BP: (!) 120/54  Pulse: (!) 58  Resp: 14  Temp: 98 F (36.7 C)  TempSrc: Oral  SpO2: 97%  Weight: 185 lb (83.9 kg)  Height: 6' (1.829 m)   BMI Assessment: Estimated body mass index is 25.09 kg/m as calculated from the following:   Height as of this encounter: 6' (1.829 m).   Weight as of this encounter: 185 lb (83.9 kg).  BMI interpretation table: BMI level Category Range association with higher incidence of chronic pain  <18 kg/m2 Underweight   18.5-24.9 kg/m2 Ideal body weight   25-29.9 kg/m2 Overweight Increased incidence by 20%  30-34.9 kg/m2 Obese (Class I) Increased incidence by 68%  35-39.9 kg/m2 Severe obesity (Class II) Increased incidence by 136%  >40 kg/m2 Extreme obesity (Class III) Increased incidence by 254%   BMI Readings from Last 4 Encounters:  03/31/17 25.09 kg/m  01/24/17 24.95 kg/m  12/30/16 24.68 kg/m  10/05/16 24.41 kg/m   Wt Readings from Last 4 Encounters:  03/31/17 185 lb (83.9 kg)  01/24/17 184 lb (83.5 kg)  12/30/16 182 lb (82.6 kg)  10/05/16 180 lb (81.6 kg)  Psych/Mental status: Alert, oriented x 3 (person, place, & time) Generalized tremor noted Eyes: PERLA Respiratory: No evidence of acute respiratory distress  Cervical Spine Exam  Inspection: No masses, redness, or swelling Alignment: Symmetrical Functional ROM: Unrestricted ROM      Stability: No instability detected Muscle strength & Tone: Functionally intact Sensory: Unimpaired Palpation: No palpable anomalies              Upper Extremity (UE) Exam    Side: Right upper extremity  Side: Left upper extremity  Inspection: No masses, redness,  swelling, or asymmetry. No contractures  Inspection: No masses, redness, swelling, or asymmetry. No contractures  Functional  ROM: Unrestricted ROM          Functional ROM: Unrestricted ROM          Muscle strength & Tone: Functionally intact  Muscle strength & Tone: Functionally intact  Sensory: Unimpaired  Sensory: Unimpaired  Palpation: No palpable anomalies              Palpation: No palpable anomalies              Specialized Test(s): Deferred         Specialized Test(s): Deferred          Thoracic Spine Exam  Inspection: No masses, redness, or swelling Alignment: Symmetrical Functional ROM: Unrestricted ROM Stability: No instability detected Sensory: Unimpaired Muscle strength & Tone: No palpable anomalies  Lumbar Spine Exam  Inspection: No masses, redness, or swelling Alignment: Symmetrical Functional ROM: Unrestricted ROM      Stability: No instability detected Muscle strength & Tone: Functionally intact Sensory: Unimpaired Palpation: No palpable anomalies       Provocative Tests: Lumbar Hyperextension and rotation test: evaluation deferred today       Lumbar Lateral bending test: evaluation deferred today       Patrick's Maneuver: evaluation deferred today                    Gait & Posture Assessment  Ambulation: Unassisted Gait: Relatively normal for age and body habitus Posture: WNL   Lower Extremity Exam    Side: Right lower extremity  Side: Left lower extremity  Inspection: No masses, redness, swelling, or asymmetry. No contractures  Inspection: No masses, redness, swelling, or asymmetry. No contractures  Functional ROM: Unrestricted ROM          Functional ROM: Unrestricted ROM          Muscle strength & Tone: Functionally intact  Muscle strength & Tone: Functionally intact  Sensory: Unimpaired  Sensory: Unimpaired  Palpation: No palpable anomalies  Palpation: No palpable anomalies   Assessment  Primary Diagnosis & Pertinent Problem List: The primary encounter  diagnosis was Lumbar lateral recess stenosis (Bilateral L>R at L4-5; Left sided at L5-S1). Diagnoses of Spinal stenosis of lumbar region, unspecified whether neurogenic claudication present, Lumbar spondylosis, Musculoskeletal pain, Long term current use of opiate analgesic, and Chronic pain syndrome were also pertinent to this visit.  Status Diagnosis  Controlled Controlled Controlled 1. Lumbar lateral recess stenosis (Bilateral L>R at L4-5; Left sided at L5-S1)   2. Spinal stenosis of lumbar region, unspecified whether neurogenic claudication present   3. Lumbar spondylosis   4. Musculoskeletal pain   5. Long term current use of opiate analgesic   6. Chronic pain syndrome     Problems updated and reviewed during this visit: No problems updated. Plan of Care  Pharmacotherapy (Medications Ordered): Meds ordered this encounter  Medications  . oxyCODONE (OXY IR/ROXICODONE) 5 MG immediate release tablet    Sig: Take 0.5 tablets (2.5 mg total) by mouth 2 (two) times daily.    Dispense:  30 tablet    Refill:  0    Do not place this medication, or any other prescription from our practice, on "Automatic Refill". Patient may have prescription filled one day early if pharmacy is closed on scheduled refill date. Do not fill until: 04/19/2017 To last until: 05/19/2017    Order Specific Question:   Supervising Provider    Answer:   Milinda Pointer [phone]  . oxyCODONE (OXY IR/ROXICODONE) 5 MG immediate release tablet    Sig:  Take 0.5 tablets (2.5 mg total) by mouth 2 (two) times daily.    Dispense:  30 tablet    Refill:  0    Do not place this medication, or any other prescription from our practice, on "Automatic Refill". Patient may have prescription filled one day early if pharmacy is closed on scheduled refill date. Do not fill until: 05/19/2017 To last until: 06/18/2017    Order Specific Question:   Supervising Provider    Answer:   Milinda Pointer [phone]  . oxyCODONE (OXY  IR/ROXICODONE) 5 MG immediate release tablet    Sig: Take 0.5 tablets (2.5 mg total) by mouth 2 (two) times daily.    Dispense:  30 tablet    Refill:  0    Do not place this medication, or any other prescription from our practice, on "Automatic Refill". Patient may have prescription filled one day early if pharmacy is closed on scheduled refill date. Do not fill until: 06/18/2017 To last until:07/18/2017    Order Specific Question:   Supervising Provider    Answer:   Milinda Pointer [phone]  . baclofen (LIORESAL) 10 MG tablet    Sig: Take 1 tablet (10 mg total) by mouth 3 (three) times daily.    Dispense:  270 tablet    Refill:  0    Do not place this medication, or any other prescription from our practice, on "Automatic Refill". Patient may have prescription filled one day early if pharmacy is closed on scheduled refill date.    Order Specific Question:   Supervising Provider    Answer:   Milinda Pointer [phone]  . Magnesium Oxide 500 MG CAPS    Sig: Take 1 capsule (500 mg total) by mouth 2 (two) times daily at 8 am and 10 pm.    Dispense:  180 capsule    Refill:  0    Do not place this medication, or any other prescription from our practice, on "Automatic Refill". Patient may have prescription filled one day early if pharmacy is closed on scheduled refill date.    Order Specific Question:   Supervising Provider    Answer:   Milinda Pointer [phone]  . gabapentin (NEURONTIN) 300 MG capsule    Sig: Take 1 capsule (300 mg total) by mouth 2 (two) times daily.    Dispense:  180 capsule    Refill:  0    Order Specific Question:   Supervising Provider    Answer:   Milinda Pointer [phone]  . naloxegol oxalate (MOVANTIK) 25 MG TABS tablet    Sig: Take 1 tablet (25 mg total) by mouth daily. Take on an empty stomach at least 1 hour before or 2 hours after a meal.    Dispense:  90 tablet    Refill:  0    Please instruct the patient not to break the tablet.    Order Specific  Question:   Supervising Provider    Answer:   Milinda Pointer [phone]   New Prescriptions   NALOXEGOL OXALATE (MOVANTIK) 25 MG TABS TABLET    Take 1 tablet (25 mg total) by mouth daily. Take on an empty stomach at least 1 hour before or 2 hours after a meal.   Medications administered today: Mr. Furia had no medications administered during this visit. Lab-work, procedure(s), and/or referral(s): No orders of the defined types were placed in this encounter.  Imaging and/or referral(s): None  Interventional therapies: Planned, scheduled, and/or pending:   Not at this time.  Considering:   Diagnostic left L5-S1 lumbar epidural steroid injection under fluoroscopic guidance, with a without sedation. Diagnostic bilateral L4-5 transforaminal epidural steroid injection under fluoroscopic guidance, with or without sedation. Diagnostic bilateral lumbar facet block under fluoroscopic guidance and IV sedation. Diagnostic right-sided cervical epidural steroid injection under fluoroscopic guidance with or without sedation.    Palliative PRN treatment(s):   Not at this time.   Provider-requested follow-up: Return in about 3 months (around 07/01/2017) for MedMgmt.  Future Appointments Date Time Provider [place]  04/14/2017 10:30 AM [facility] None  04/14/2017 10:45 AM Lequita Asal, MD [facility] None  06/30/2017 9:30 AM Vevelyn Francois, NP [facility] None   Primary Care Physician: Cletis [place], MD Location: [facility] Note by: Vevelyn Francois NP Date: 03/31/2017; Time: 2:08 PM  Pain Score Disclaimer: We use the NRS-11 scale. This is a self-reported, subjective measurement of pain severity with only modest accuracy. It is used primarily to identify changes within a particular patient. It must be understood that outpatient pain scales are significantly less accurate that those used for research, where they can be applied under  ideal controlled circumstances with minimal exposure to variables. In reality, the score is likely to be a combination of pain intensity and pain affect, where pain affect describes the degree of emotional arousal or changes in action readiness caused by the sensory experience of pain. Factors such as social and work situation, setting, emotional state, anxiety levels, expectation, and prior pain experience may influence pain perception and show large inter-individual differences that may also be affected by time variables.  Patient instructions provided during this appointment: Patient Instructions  You have been given 3 scripts for oxycodone today.  You have a prescription waiting at your pharmacy to pick up.   ____________________________________________________________________________________________  Medication Rules  Applies to: All patients receiving prescriptions (written or electronic).  Pharmacy of record: Pharmacy where electronic prescriptions will be sent. If written prescriptions are taken to a different pharmacy, please inform the nursing staff. The pharmacy listed in the electronic medical record should be the one where you would like electronic prescriptions to be sent.  Prescription refills: Only during scheduled appointments. Applies to both, written and electronic prescriptions.  NOTE: The following applies primarily to controlled substances (Opioid* Pain Medications).   Patient's responsibilities: 1. Pain Pills: Bring all pain pills to every appointment (except for procedure appointments). 2. Pill Bottles: Bring pills in original pharmacy bottle. Always bring newest bottle. Bring bottle, even if empty. 3. Medication refills: You are responsible for knowing and keeping track of what medications you need refilled. The day before your appointment, write a list of all prescriptions that need to be refilled. Bring that list to your appointment and give it to the admitting nurse.  Prescriptions will be written only during appointments. If you forget a medication, it will not be "Called in", "Faxed", or "electronically sent". You will need to get another appointment to get these prescribed. 4. Prescription Accuracy: You are responsible for carefully inspecting your prescriptions before leaving our office. Have the discharge nurse carefully go over each prescription with you, before taking them home. Make sure that your name is accurately spelled, that your address is correct. Check the name and dose of your medication to make sure it is accurate. Check the number of pills, and the written instructions to make sure they are clear and accurate. Make sure that you are given enough medication to last until your next medication  refill appointment. 5. Taking Medication: Take medication as prescribed. Never take more pills than instructed. Never take medication more frequently than prescribed. Taking less pills or less frequently is permitted and encouraged, when it comes to controlled substances (written prescriptions).  6. Inform other Doctors: Always inform, all of your healthcare providers, of all the medications you take. 7. Pain Medication from other Providers: You are not allowed to accept any additional pain medication from any other Doctor or Healthcare provider. There are two exceptions to this rule. (see below) In the event that you require additional pain medication, you are responsible for notifying us, as stated below. 8. Medication Agreement: You are responsible for carefully reading and following our Medication Agreement. This must be signed before receiving any prescriptions from our practice. Safely store a copy of your signed Agreement. Violations to the Agreement will result in no further prescriptions. (Additional copies of our Medication Agreement are available upon request.) 9. Laws, Rules, & Regulations: All patients are expected to follow all Federal and [employer],  [employer], Rules, [employer]. Ignorance of the Laws does not constitute a valid excuse. The use of any illegal substances is prohibited. 10. Adopted CDC guidelines & recommendations: Target dosing levels will be at or below 60 MME/day. Use of benzodiazepines** is not recommended.  Exceptions: There are only two exceptions to the rule of not receiving pain medications from other Healthcare Providers. 1. Exception #1 (Emergencies): In the event of an emergency (i.e.: accident requiring emergency care), you are allowed to receive additional pain medication. However, you are responsible for: As soon as you are able, call our office [phone], at any time of the day or night, and leave a message stating your name, the date and nature of the emergency, and the name and dose of the medication prescribed. In the event that your call is answered by a member of our staff, make sure to document and save the date, time, and the name of the person that took your information.  2. Exception #2 (Planned Surgery): In the event that you are scheduled by another doctor or dentist to have any type of surgery or procedure, you are allowed (for a period no longer than 30 days), to receive additional pain medication, for the acute post-op pain. However, in this case, you are responsible for picking up a copy of our "Post-op Pain Management for Surgeons" handout, and giving it to your surgeon or dentist. This document is available at our office, and does not require an appointment to obtain it. Simply go to our office during business hours (Monday-Thursday from 8:00 AM to 4:00 PM) (Friday 8:00 AM to 12:00 Noon) or if you have a scheduled appointment with [place], prior to your surgery, and ask for it by name. In addition, you will need to provide [place] with your name, name of your surgeon, type of surgery, and date of procedure or surgery.  *Opioid medications include: morphine, codeine, oxycodone, oxymorphone, hydrocodone,  hydromorphone, meperidine, tramadol, tapentadol, buprenorphine, fentanyl, methadone. **Benzodiazepine medications include: diazepam (Valium), alprazolam (Xanax), clonazepam (Klonopine), lorazepam (Ativan), clorazepate (Tranxene), chlordiazepoxide (Librium), estazolam (Prosom), oxazepam (Serax), temazepam (Restoril), triazolam (Halcion)  ____________________________________________________________________________________________

## 2017-03-31 NOTE — Patient Instructions (Addendum)
You have been given 3 scripts for oxycodone today.  You have a prescription waiting at your pharmacy to pick up.   ____________________________________________________________________________________________  Medication Rules  Applies to: All patients receiving prescriptions (written or electronic).  Pharmacy of record: Pharmacy where electronic prescriptions will be sent. If written prescriptions are taken to a different pharmacy, please inform the nursing staff. The pharmacy listed in the electronic medical record should be the one where you would like electronic prescriptions to be sent.  Prescription refills: Only during scheduled appointments. Applies to both, written and electronic prescriptions.  NOTE: The following applies primarily to controlled substances (Opioid* Pain Medications).   Patient's responsibilities: 1. Pain Pills: Bring all pain pills to every appointment (except for procedure appointments). 2. Pill Bottles: Bring pills in original pharmacy bottle. Always bring newest bottle. Bring bottle, even if empty. 3. Medication refills: You are responsible for knowing and keeping track of what medications you need refilled. The day before your appointment, write a list of all prescriptions that need to be refilled. Bring that list to your appointment and give it to the admitting nurse. Prescriptions will be written only during appointments. If you forget a medication, it will not be "Called in", "Faxed", or "electronically sent". You will need to get another appointment to get these prescribed. 4. Prescription Accuracy: You are responsible for carefully inspecting your prescriptions before leaving our office. Have the discharge nurse carefully go over each prescription with you, before taking them home. Make sure that your name is accurately spelled, that your address is correct. Check the name and dose of your medication to make sure it is accurate. Check the number of pills, and the  written instructions to make sure they are clear and accurate. Make sure that you are given enough medication to last until your next medication refill appointment. 5. Taking Medication: Take medication as prescribed. Never take more pills than instructed. Never take medication more frequently than prescribed. Taking less pills or less frequently is permitted and encouraged, when it comes to controlled substances (written prescriptions).  6. Inform other Doctors: Always inform, all of your healthcare providers, of all the medications you take. 7. Pain Medication from other Providers: You are not allowed to accept any additional pain medication from any other Doctor or Healthcare provider. There are two exceptions to this rule. (see below) In the event that you require additional pain medication, you are responsible for notifying us, as stated below. 8. Medication Agreement: You are responsible for carefully reading and following our Medication Agreement. This must be signed before receiving any prescriptions from our practice. Safely store a copy of your signed Agreement. Violations to the Agreement will result in no further prescriptions. (Additional copies of our Medication Agreement are available upon request.) 9. Laws, Rules, & Regulations: All patients are expected to follow all Federal and [employer], [employer], Rules, [employer]. Ignorance of the Laws does not constitute a valid excuse. The use of any illegal substances is prohibited. 10. Adopted CDC guidelines & recommendations: Target dosing levels will be at or below 60 MME/day. Use of benzodiazepines** is not recommended.  Exceptions: There are only two exceptions to the rule of not receiving pain medications from other Healthcare Providers. 1. Exception #1 (Emergencies): In the event of an emergency (i.e.: accident requiring emergency care), you are allowed to receive additional pain medication. However, you are responsible for: As soon as you  are able, call our office [phone], at any time of the day or  night, and leave a message stating your name, the date and nature of the emergency, and the name and dose of the medication prescribed. In the event that your call is answered by a member of our staff, make sure to document and save the date, time, and the name of the person that took your information.  2. Exception #2 (Planned Surgery): In the event that you are scheduled by another doctor or dentist to have any type of surgery or procedure, you are allowed (for a period no longer than 30 days), to receive additional pain medication, for the acute post-op pain. However, in this case, you are responsible for picking up a copy of our "Post-op Pain Management for Surgeons" handout, and giving it to your surgeon or dentist. This document is available at our office, and does not require an appointment to obtain it. Simply go to our office during business hours (Monday-Thursday from 8:00 AM to 4:00 PM) (Friday 8:00 AM to 12:00 Noon) or if you have a scheduled appointment with [place], prior to your surgery, and ask for it by name. In addition, you will need to provide [place] with your name, name of your surgeon, type of surgery, and date of procedure or surgery.  *Opioid medications include: morphine, codeine, oxycodone, oxymorphone, hydrocodone, hydromorphone, meperidine, tramadol, tapentadol, buprenorphine, fentanyl, methadone. **Benzodiazepine medications include: diazepam (Valium), alprazolam (Xanax), clonazepam (Klonopine), lorazepam (Ativan), clorazepate (Tranxene), chlordiazepoxide (Librium), estazolam (Prosom), oxazepam (Serax), temazepam (Restoril), triazolam (Halcion)  ____________________________________________________________________________________________

## 2017-03-31 NOTE — Progress Notes (Signed)
Nursing Pain Medication Assessment:  Safety precautions to be maintained throughout the outpatient stay will include: orient to surroundings, keep bed in low position, maintain call bell within reach at all times, provide assistance with transfer out of bed and ambulation.  Medication Inspection Compliance: Pill count conducted under aseptic conditions, in front of the patient. Neither the pills nor the bottle was removed from the patient's sight at any time. Once count was completed pills were immediately returned to the patient in their original bottle.  Medication: Oxycodone IR Pill/Patch Count: 20 of 30 pills remain Pill/Patch Appearance: Markings consistent with prescribed medication Bottle Appearance: Standard pharmacy container. Clearly labeled. Filled Date: 07/16/ 2018 Last Medication intake:  Today

## 2017-04-14 ENCOUNTER — Inpatient Hospital Stay: Payer: Medicare PPO

## 2017-04-14 ENCOUNTER — Inpatient Hospital Stay: Payer: Medicare PPO | Admitting: Hematology and Oncology
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
## 2017-04-29 ENCOUNTER — Inpatient Hospital Stay: Payer: Medicare PPO | Admitting: Hematology and Oncology

## 2017-04-29 ENCOUNTER — Inpatient Hospital Stay: Payer: Medicare PPO | Attending: Hematology and Oncology

## 2017-05-19 ENCOUNTER — Inpatient Hospital Stay: Payer: Medicare PPO

## 2017-05-19 ENCOUNTER — Other Ambulatory Visit: Payer: Self-pay | Admitting: *Deleted

## 2017-05-19 ENCOUNTER — Inpatient Hospital Stay: Payer: Medicare PPO | Attending: Hematology and Oncology | Admitting: Hematology and Oncology

## 2017-05-19 ENCOUNTER — Encounter: Payer: Self-pay | Admitting: Hematology and Oncology

## 2017-05-19 VITALS — BP 116/65 | HR 50 | Temp 98.0°F | Resp 12 | Ht 72.0 in | Wt 192.0 lb

## 2017-05-19 DIAGNOSIS — M109 Gout, unspecified: Secondary | ICD-10-CM | POA: Insufficient documentation

## 2017-05-19 DIAGNOSIS — K7460 Unspecified cirrhosis of liver: Secondary | ICD-10-CM | POA: Diagnosis not present

## 2017-05-19 DIAGNOSIS — D696 Thrombocytopenia, unspecified: Secondary | ICD-10-CM | POA: Insufficient documentation

## 2017-05-19 DIAGNOSIS — Z79899 Other long term (current) drug therapy: Secondary | ICD-10-CM | POA: Diagnosis not present

## 2017-05-19 DIAGNOSIS — Z23 Encounter for immunization: Secondary | ICD-10-CM

## 2017-05-19 DIAGNOSIS — I10 Essential (primary) hypertension: Secondary | ICD-10-CM | POA: Insufficient documentation

## 2017-05-19 DIAGNOSIS — F17210 Nicotine dependence, cigarettes, uncomplicated: Secondary | ICD-10-CM | POA: Insufficient documentation

## 2017-05-19 DIAGNOSIS — F1010 Alcohol abuse, uncomplicated: Secondary | ICD-10-CM | POA: Diagnosis not present

## 2017-05-19 DIAGNOSIS — F419 Anxiety disorder, unspecified: Secondary | ICD-10-CM | POA: Diagnosis not present

## 2017-05-19 DIAGNOSIS — B1910 Unspecified viral hepatitis B without hepatic coma: Secondary | ICD-10-CM | POA: Diagnosis not present

## 2017-05-19 DIAGNOSIS — M479 Spondylosis, unspecified: Secondary | ICD-10-CM | POA: Insufficient documentation

## 2017-05-19 DIAGNOSIS — R161 Splenomegaly, not elsewhere classified: Secondary | ICD-10-CM

## 2017-05-19 DIAGNOSIS — K635 Polyp of colon: Secondary | ICD-10-CM

## 2017-05-19 DIAGNOSIS — J449 Chronic obstructive pulmonary disease, unspecified: Secondary | ICD-10-CM | POA: Diagnosis not present

## 2017-05-19 LAB — PROTIME-INR
INR: 1.02
Prothrombin Time: 13.3 seconds (ref 11.4–15.2)

## 2017-05-19 LAB — CBC WITH DIFFERENTIAL/PLATELET
Basophils Absolute: 0 10*3/uL (ref 0–0.1)
Basophils Relative: 1 %
Eosinophils Absolute: 0.1 10*3/uL (ref 0–0.7)
Eosinophils Relative: 5 %
HCT: 37.4 % — ABNORMAL LOW (ref 40.0–52.0)
Hemoglobin: 13.2 g/dL (ref 13.0–18.0)
Lymphocytes Relative: 27 %
Lymphs Abs: 0.8 10*3/uL — ABNORMAL LOW (ref 1.0–3.6)
MCH: 33.6 pg (ref 26.0–34.0)
MCHC: 35.3 g/dL (ref 32.0–36.0)
MCV: 95.4 fL (ref 80.0–100.0)
Monocytes Absolute: 0.2 10*3/uL (ref 0.2–1.0)
Monocytes Relative: 7 %
Neutro Abs: 1.8 10*3/uL (ref 1.4–6.5)
Neutrophils Relative %: 60 %
Platelets: 76 10*3/uL — ABNORMAL LOW (ref 150–440)
RBC: 3.92 MIL/uL — ABNORMAL LOW (ref 4.40–5.90)
RDW: 14.1 % (ref 11.5–14.5)
WBC: 3 10*3/uL — ABNORMAL LOW (ref 3.8–10.6)

## 2017-05-19 LAB — COMPREHENSIVE METABOLIC PANEL
ALT: 17 U/L (ref 17–63)
AST: 27 U/L (ref 15–41)
Albumin: 4.4 g/dL (ref 3.5–5.0)
Alkaline Phosphatase: 58 U/L (ref 38–126)
Anion gap: 8 (ref 5–15)
BUN: 11 mg/dL (ref 6–20)
CO2: 29 mmol/L (ref 22–32)
Calcium: 8.9 mg/dL (ref 8.9–10.3)
Chloride: 100 mmol/L — ABNORMAL LOW (ref 101–111)
Creatinine, Ser: 0.68 mg/dL (ref 0.61–1.24)
GFR calc Af Amer: >60 mL/min (ref >60)
GFR calc non Af Amer: >60 mL/min (ref >60)
Glucose, Bld: 100 mg/dL — ABNORMAL HIGH (ref 65–99)
Potassium: 4.1 mmol/L (ref 3.5–5.1)
Sodium: 137 mmol/L (ref 135–145)
Total Bilirubin: 0.6 mg/dL (ref 0.3–1.2)
Total Protein: 7.1 g/dL (ref 6.5–8.1)
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
## 2017-05-19 NOTE — Progress Notes (Signed)
Patient has gained 7 lbs since his last visit. Patient states he has no energy for the past several months.

## 2017-05-20 LAB — AFP TUMOR MARKER: AFP, Serum, Tumor Marker: 3.5 ng/mL (ref 0.0–8.3)

## 2017-05-25 ENCOUNTER — Ambulatory Visit
Admission: RE | Admit: 2017-05-25 | Discharge: 2017-05-25 | Disposition: A | Discharge status: Home / Self Care | Payer: Medicare PPO | Source: Ambulatory Visit | Attending: Urgent Care | Admitting: Urgent Care

## 2017-05-25 DIAGNOSIS — K7460 Unspecified cirrhosis of liver: Secondary | ICD-10-CM | POA: Insufficient documentation

## 2017-05-25 DIAGNOSIS — I77811 Abdominal aortic ectasia: Secondary | ICD-10-CM | POA: Diagnosis not present

## 2017-05-25 DIAGNOSIS — R161 Splenomegaly, not elsewhere classified: Secondary | ICD-10-CM | POA: Diagnosis not present

## 2017-05-25 DIAGNOSIS — M359 Systemic involvement of connective tissue, unspecified: Secondary | ICD-10-CM | POA: Insufficient documentation

## 2017-05-25 DIAGNOSIS — K8020 Calculus of gallbladder without cholecystitis without obstruction: Secondary | ICD-10-CM | POA: Diagnosis not present

## 2017-06-30 ENCOUNTER — Ambulatory Visit: Payer: Medicare PPO | Attending: Nurse Practitioner | Admitting: Nurse Practitioner

## 2017-06-30 ENCOUNTER — Telehealth: Payer: Self-pay | Admitting: Nurse Practitioner

## 2017-06-30 ENCOUNTER — Encounter: Payer: Self-pay | Admitting: Nurse Practitioner

## 2017-06-30 VITALS — BP 111/58 | HR 57 | Temp 98.3°F | Resp 18 | Ht 72.0 in | Wt 185.0 lb

## 2017-06-30 DIAGNOSIS — J449 Chronic obstructive pulmonary disease, unspecified: Secondary | ICD-10-CM | POA: Insufficient documentation

## 2017-06-30 DIAGNOSIS — M79605 Pain in left leg: Secondary | ICD-10-CM | POA: Insufficient documentation

## 2017-06-30 DIAGNOSIS — M109 Gout, unspecified: Secondary | ICD-10-CM | POA: Insufficient documentation

## 2017-06-30 DIAGNOSIS — G894 Chronic pain syndrome: Secondary | ICD-10-CM | POA: Diagnosis not present

## 2017-06-30 DIAGNOSIS — M5416 Radiculopathy, lumbar region: Secondary | ICD-10-CM

## 2017-06-30 DIAGNOSIS — M79604 Pain in right leg: Secondary | ICD-10-CM | POA: Diagnosis present

## 2017-06-30 DIAGNOSIS — Z885 Allergy status to narcotic agent status: Secondary | ICD-10-CM | POA: Insufficient documentation

## 2017-06-30 DIAGNOSIS — R262 Difficulty in walking, not elsewhere classified: Secondary | ICD-10-CM | POA: Insufficient documentation

## 2017-06-30 DIAGNOSIS — M5441 Lumbago with sciatica, right side: Secondary | ICD-10-CM | POA: Diagnosis not present

## 2017-06-30 DIAGNOSIS — F419 Anxiety disorder, unspecified: Secondary | ICD-10-CM | POA: Insufficient documentation

## 2017-06-30 DIAGNOSIS — Z79891 Long term (current) use of opiate analgesic: Secondary | ICD-10-CM | POA: Insufficient documentation

## 2017-06-30 DIAGNOSIS — K219 Gastro-esophageal reflux disease without esophagitis: Secondary | ICD-10-CM | POA: Diagnosis not present

## 2017-06-30 DIAGNOSIS — G4733 Obstructive sleep apnea (adult) (pediatric): Secondary | ICD-10-CM | POA: Diagnosis not present

## 2017-06-30 DIAGNOSIS — G621 Alcoholic polyneuropathy: Secondary | ICD-10-CM | POA: Diagnosis not present

## 2017-06-30 DIAGNOSIS — M79645 Pain in left finger(s): Secondary | ICD-10-CM | POA: Diagnosis not present

## 2017-06-30 DIAGNOSIS — M5412 Radiculopathy, cervical region: Secondary | ICD-10-CM | POA: Diagnosis not present

## 2017-06-30 DIAGNOSIS — G5603 Carpal tunnel syndrome, bilateral upper limbs: Secondary | ICD-10-CM | POA: Insufficient documentation

## 2017-06-30 DIAGNOSIS — M545 Low back pain: Secondary | ICD-10-CM | POA: Diagnosis present

## 2017-06-30 DIAGNOSIS — D696 Thrombocytopenia, unspecified: Secondary | ICD-10-CM | POA: Diagnosis not present

## 2017-06-30 DIAGNOSIS — G8929 Other chronic pain: Secondary | ICD-10-CM | POA: Diagnosis not present

## 2017-06-30 DIAGNOSIS — K860 Alcohol-induced chronic pancreatitis: Secondary | ICD-10-CM | POA: Diagnosis not present

## 2017-06-30 DIAGNOSIS — M4802 Spinal stenosis, cervical region: Secondary | ICD-10-CM | POA: Diagnosis not present

## 2017-06-30 DIAGNOSIS — B1920 Unspecified viral hepatitis C without hepatic coma: Secondary | ICD-10-CM | POA: Insufficient documentation

## 2017-06-30 DIAGNOSIS — I10 Essential (primary) hypertension: Secondary | ICD-10-CM | POA: Insufficient documentation

## 2017-06-30 DIAGNOSIS — M25512 Pain in left shoulder: Secondary | ICD-10-CM | POA: Insufficient documentation

## 2017-06-30 DIAGNOSIS — M542 Cervicalgia: Secondary | ICD-10-CM | POA: Diagnosis not present

## 2017-06-30 DIAGNOSIS — M48061 Spinal stenosis, lumbar region without neurogenic claudication: Secondary | ICD-10-CM | POA: Diagnosis not present

## 2017-06-30 DIAGNOSIS — M5442 Lumbago with sciatica, left side: Secondary | ICD-10-CM | POA: Diagnosis not present

## 2017-06-30 DIAGNOSIS — K5909 Other constipation: Secondary | ICD-10-CM | POA: Diagnosis not present

## 2017-06-30 DIAGNOSIS — M25511 Pain in right shoulder: Secondary | ICD-10-CM | POA: Insufficient documentation

## 2017-06-30 DIAGNOSIS — M47816 Spondylosis without myelopathy or radiculopathy, lumbar region: Secondary | ICD-10-CM | POA: Insufficient documentation

## 2017-06-30 DIAGNOSIS — K7460 Unspecified cirrhosis of liver: Secondary | ICD-10-CM | POA: Diagnosis not present

## 2017-06-30 DIAGNOSIS — G40909 Epilepsy, unspecified, not intractable, without status epilepticus: Secondary | ICD-10-CM | POA: Diagnosis not present

## 2017-06-30 DIAGNOSIS — R161 Splenomegaly, not elsewhere classified: Secondary | ICD-10-CM | POA: Diagnosis not present

## 2017-06-30 DIAGNOSIS — M7918 Myalgia, other site: Secondary | ICD-10-CM | POA: Insufficient documentation

## 2017-06-30 DIAGNOSIS — D72819 Decreased white blood cell count, unspecified: Secondary | ICD-10-CM | POA: Insufficient documentation

## 2017-06-30 DIAGNOSIS — F329 Major depressive disorder, single episode, unspecified: Secondary | ICD-10-CM | POA: Insufficient documentation

## 2017-06-30 DIAGNOSIS — M47899 Other spondylosis, site unspecified: Secondary | ICD-10-CM | POA: Insufficient documentation

## 2017-06-30 MED ORDER — OXYCODONE HCL 5 MG PO TABS: 2.5000 mg | ORAL_TABLET | Freq: Two times a day (BID) | ORAL | 0 refills | Status: DC

## 2017-06-30 MED ORDER — BACLOFEN 10 MG PO TABS: 10.0000 mg | ORAL_TABLET | Freq: Three times a day (TID) | ORAL | 0 refills | Status: DC

## 2017-06-30 MED ORDER — GABAPENTIN 300 MG PO CAPS: 300.0000 mg | ORAL_CAPSULE | Freq: Two times a day (BID) | ORAL | 0 refills | Status: DC

## 2017-06-30 NOTE — Patient Instructions (Addendum)
____________________________________________________________________________________________  Medication Rules  Applies to: All patients receiving prescriptions (written or electronic).  Pharmacy of record: Pharmacy where electronic prescriptions will be sent. If written prescriptions are taken to a different pharmacy, please inform the nursing staff. The pharmacy listed in the electronic medical record should be the one where you would like electronic prescriptions to be sent.  Prescription refills: Only during scheduled appointments. Applies to both, written and electronic prescriptions.  NOTE: The following applies primarily to controlled substances (Opioid* Pain Medications).   Patient's responsibilities: 1. Pain Pills: Bring all pain pills to every appointment (except for procedure appointments). 2. Pill Bottles: Bring pills in original pharmacy bottle. Always bring newest bottle. Bring bottle, even if empty. 3. Medication refills: You are responsible for knowing and keeping track of what medications you need refilled. The day before your appointment, write a list of all prescriptions that need to be refilled. Bring that list to your appointment and give it to the admitting nurse. Prescriptions will be written only during appointments. If you forget a medication, it will not be "Called in", "Faxed", or "electronically sent". You will need to get another appointment to get these prescribed. 4. Prescription Accuracy: You are responsible for carefully inspecting your prescriptions before leaving our office. Have the discharge nurse carefully go over each prescription with you, before taking them home. Make sure that your name is accurately spelled, that your address is correct. Check the name and dose of your medication to make sure it is accurate. Check the number of pills, and the written instructions to make sure they are clear and accurate. Make sure that you are given enough medication to  last until your next medication refill appointment. 5. Taking Medication: Take medication as prescribed. Never take more pills than instructed. Never take medication more frequently than prescribed. Taking less pills or less frequently is permitted and encouraged, when it comes to controlled substances (written prescriptions).  6. Inform other Doctors: Always inform, all of your healthcare providers, of all the medications you take. 7. Pain Medication from other Providers: You are not allowed to accept any additional pain medication from any other Doctor or Healthcare provider. There are two exceptions to this rule. (see below) In the event that you require additional pain medication, you are responsible for notifying us, as stated below. 8. Medication Agreement: You are responsible for carefully reading and following our Medication Agreement. This must be signed before receiving any prescriptions from our practice. Safely store a copy of your signed Agreement. Violations to the Agreement will result in no further prescriptions. (Additional copies of our Medication Agreement are available upon request.) 9. Laws, Rules, & Regulations: All patients are expected to follow all Federal and State Laws, Statutes, Rules, & Regulations. Ignorance of the Laws does not constitute a valid excuse. The use of any illegal substances is prohibited. 10. Adopted CDC guidelines & recommendations: Target dosing levels will be at or below 60 MME/day. Use of benzodiazepines** is not recommended.  Exceptions: There are only two exceptions to the rule of not receiving pain medications from other Healthcare Providers. 1. Exception #1 (Emergencies): In the event of an emergency (i.e.: accident requiring emergency care), you are allowed to receive additional pain medication. However, you are responsible for: As soon as you are able, call our office [phone], at any time of the day or night, and leave a message stating your  name, the date and nature of the emergency, and the name and dose of the medication   prescribed. In the event that your call is answered by a member of our staff, make sure to document and save the date, time, and the name of the person that took your information.  2. Exception #2 (Planned Surgery): In the event that you are scheduled by another doctor or dentist to have any type of surgery or procedure, you are allowed (for a period no longer than 30 days), to receive additional pain medication, for the acute post-op pain. However, in this case, you are responsible for picking up a copy of our "Post-op Pain Management for Surgeons" handout, and giving it to your surgeon or dentist. This document is available at our office, and does not require an appointment to obtain it. Simply go to our office during business hours (Monday-Thursday from 8:00 AM to 4:00 PM) (Friday 8:00 AM to 12:00 Noon) or if you have a scheduled appointment with [place], prior to your surgery, and ask for it by name. In addition, you will need to provide [place] with your name, name of your surgeon, type of surgery, and date of procedure or surgery.  *Opioid medications include: morphine, codeine, oxycodone, oxymorphone, hydrocodone, hydromorphone, meperidine, tramadol, tapentadol, buprenorphine, fentanyl, methadone. **Benzodiazepine medications include: diazepam (Valium), alprazolam (Xanax), clonazepam (Klonopine), lorazepam (Ativan), clorazepate (Tranxene), chlordiazepoxide (Librium), estazolam (Prosom), oxazepam (Serax), temazepam (Restoril), triazolam (Halcion)  ____________________________________________________________________________________________ Pain Management Discharge Instructions  General Discharge Instructions :  If you need to reach your doctor call: Monday-Friday 8:00 am - 4:00 pm at [phone] or toll free [phone].  After clinic hours [phone] to have operator reach doctor.  Bring all of your medication bottles  to all your appointments in the pain clinic.  To cancel or reschedule your appointment with Pain Management please remember to call 24 hours in advance to avoid a fee.  Refer to the educational materials which you have been given on: General Risks, I had my Procedure. Discharge Instructions, Post Sedation.  Post Procedure Instructions:  The drugs you were given will stay in your system until tomorrow, so for the next 24 hours you should not drive, make any legal decisions or drink any alcoholic beverages.  You may eat anything you prefer, but it is better to start with liquids then soups and crackers, and gradually work up to solid foods.  Please notify your doctor immediately if you have any unusual bleeding, trouble breathing or pain that is not related to your normal pain.  Depending on the type of procedure that was done, some parts of your body may feel week and/or numb.  This usually clears up by tonight or the next day.  Walk with the use of an assistive device or accompanied by an adult for the 24 hours.  You may use ice on the affected area for the first 24 hours.  Put ice in a Ziploc bag and cover with a towel and place against area 15 minutes on 15 minutes off.  You may switch to heat after 24 hours.GENERAL RISKS AND COMPLICATIONS  What are the risk, side effects and possible complications? Generally speaking, most procedures are safe.  However, with any procedure there are risks, side effects, and the possibility of complications.  The risks and complications are dependent upon the sites that are lesioned, or the type of nerve block to be performed.  The closer the procedure is to the spine, the more serious the risks are.  Great care is taken when placing the radio frequency needles, block needles or lesioning probes, but sometimes complications  can occur. 1. Infection: Any time there is an injection through the skin, there is a risk of infection.  This is why sterile conditions  are used for these blocks.  There are four possible types of infection. 1. Localized skin infection. 2. Central Nervous System Infection-This can be in the form of Meningitis, which can be deadly. 3. Epidural Infections-This can be in the form of an epidural abscess, which can cause pressure inside of the spine, causing compression of the spinal cord with subsequent paralysis. This would require an emergency surgery to decompress, and there are no guarantees that the patient would recover from the paralysis. 4. Discitis-This is an infection of the intervertebral discs.  It occurs in about 1% of discography procedures.  It is difficult to treat and it may lead to surgery.        2. Pain: the needles have to go through skin and soft tissues, will cause soreness.       3. Damage to internal structures:  The nerves to be lesioned may be near blood vessels or    other nerves which can be potentially damaged.       4. Bleeding: Bleeding is more common if the patient is taking blood thinners such as  aspirin, Coumadin, Ticiid, Plavix, etc., or if he/she have some genetic predisposition  such as hemophilia. Bleeding into the spinal canal can cause compression of the spinal  cord with subsequent paralysis.  This would require an emergency surgery to  decompress and there are no guarantees that the patient would recover from the  paralysis.       5. Pneumothorax:  Puncturing of a lung is a possibility, every time a needle is introduced in  the area of the chest or upper back.  Pneumothorax refers to free air around the  collapsed lung(s), inside of the thoracic cavity (chest cavity).  Another two possible  complications related to a similar event would include: Hemothorax and Chylothorax.   These are variations of the Pneumothorax, where instead of air around the collapsed  lung(s), you may have blood or chyle, respectively.       6. Spinal headaches: They may occur with any procedures in the area of the spine.        7. Persistent CSF (Cerebro-Spinal Fluid) leakage: This is a rare problem, but may occur  with prolonged intrathecal or epidural catheters either due to the formation of a fistulous  track or a dural tear.       8. Nerve damage: By working so close to the spinal cord, there is always a possibility of  nerve damage, which could be as serious as a permanent spinal cord injury with  paralysis.       9. Death:  Although rare, severe deadly allergic reactions known as "Anaphylactic  reaction" can occur to any of the medications used.      10. Worsening of the symptoms:  We can always make thing worse.  What are the chances of something like this happening? Chances of any of this occuring are extremely low.  By statistics, you have more of a chance of getting killed in a motor vehicle accident: while driving to the hospital than any of the above occurring .  Nevertheless, you should be aware that they are possibilities.  In general, it is similar to taking a shower.  Everybody knows that you can slip, hit your head and get killed.  Does that mean that you should not shower  again?  Nevertheless always keep in mind that statistics do not mean anything if you happen to be on the wrong side of them.  Even if a procedure has a 1 (one) in a 1,000,000 (million) chance of going wrong, it you happen to be that one..Also, keep in mind that by statistics, you have more of a chance of having something go wrong when taking medications.  Who should not have this procedure? If you are on a blood thinning medication (e.g. Coumadin, Plavix, see list of "Blood Thinners"), or if you have an active infection going on, you should not have the procedure.  If you are taking any blood thinners, please inform your physician.  How should I prepare for this procedure?  Do not eat or drink anything at least six hours prior to the procedure.  Bring a driver with you .  It cannot be a taxi.  Come accompanied by an adult that can  drive you back, and that is strong enough to help you if your legs get weak or numb from the local anesthetic.  Take all of your medicines the morning of the procedure with just enough water to swallow them.  If you have diabetes, make sure that you are scheduled to have your procedure done first thing in the morning, whenever possible.  If you have diabetes, take only half of your insulin dose and notify our nurse that you have done so as soon as you arrive at the clinic.  If you are diabetic, but only take blood sugar pills (oral hypoglycemic), then do not take them on the morning of your procedure.  You may take them after you have had the procedure.  Do not take aspirin or any aspirin-containing medications, at least eleven (11) days prior to the procedure.  They may prolong bleeding.  Wear loose fitting clothing that may be easy to take off and that you would not mind if it got stained with Betadine or blood.  Do not wear any jewelry or perfume  Remove any nail coloring.  It will interfere with some of our monitoring equipment.  NOTE: Remember that this is not meant to be interpreted as a complete list of all possible complications.  Unforeseen problems may occur.  BLOOD THINNERS The following drugs contain aspirin or other products, which can cause increased bleeding during surgery and should not be taken for 2 weeks prior to and 1 week after surgery.  If you should need take something for relief of minor pain, you may take acetaminophen which is found in Tylenol,m Datril, Anacin-3 and Panadol. It is not blood thinner. The products listed below are.  Do not take any of the products listed below in addition to any listed on your instruction sheet.  A.P.C or A.P.C with Codeine Codeine Phosphate Capsules #3 Ibuprofen Ridaura  ABC compound Congesprin Imuran rimadil  Advil Cope Indocin Robaxisal  Alka-Seltzer Effervescent Pain Reliever and Antacid Coricidin or Coricidin-D  Indomethacin  Rufen  Alka-Seltzer plus Cold Medicine Cosprin Ketoprofen S-A-C Tablets  Anacin Analgesic Tablets or Capsules Coumadin Korlgesic Salflex  Anacin Extra Strength Analgesic tablets or capsules CP-2 Tablets Lanoril Salicylate  Anaprox Cuprimine Capsules Levenox Salocol  Anexsia-D Dalteparin Magan Salsalate  Anodynos Darvon compound Magnesium Salicylate Sine-off  Ansaid Dasin Capsules Magsal Sodium Salicylate  Anturane Depen Capsules Marnal Soma  APF Arthritis pain formula Dewitt's Pills Measurin Stanback  Argesic Dia-Gesic Meclofenamic Sulfinpyrazone  Arthritis Bayer Timed Release Aspirin Diclofenac Meclomen Sulindac  Arthritis pain formula Anacin Dicumarol  Medipren Supac  Analgesic (Safety coated) Arthralgen Diffunasal Mefanamic Suprofen  Arthritis Strength Bufferin Dihydrocodeine Mepro Compound Suprol  Arthropan liquid Dopirydamole Methcarbomol with Aspirin Synalgos  ASA tablets/Enseals Disalcid Micrainin Tagament  Ascriptin Doan's Midol Talwin  Ascriptin A/D Dolene Mobidin Tanderil  Ascriptin Extra Strength Dolobid Moblgesic Ticlid  Ascriptin with Codeine Doloprin or Doloprin with Codeine Momentum Tolectin  Asperbuf Duoprin Mono-gesic Trendar  Aspergum Duradyne Motrin or Motrin IB Triminicin  Aspirin plain, buffered or enteric coated Durasal Myochrisine Trigesic  Aspirin Suppositories Easprin Nalfon Trillsate  Aspirin with Codeine Ecotrin Regular or Extra Strength Naprosyn Uracel  Atromid-S Efficin Naproxen Ursinus  Auranofin Capsules Elmiron Neocylate Vanquish  Axotal Emagrin Norgesic Verin  Azathioprine Empirin or Empirin with Codeine Normiflo Vitamin E  Azolid Emprazil Nuprin Voltaren  Bayer Aspirin plain, buffered or children's or timed BC Tablets or powders Encaprin Orgaran Warfarin Sodium  Buff-a-Comp Enoxaparin Orudis Zorpin  Buff-a-Comp with Codeine Equegesic Os-Cal-Gesic   Buffaprin Excedrin plain, buffered or Extra Strength Oxalid   Bufferin Arthritis Strength Feldene  Oxphenbutazone   Bufferin plain or Extra Strength Feldene Capsules Oxycodone with Aspirin   Bufferin with Codeine Fenoprofen Fenoprofen Pabalate or Pabalate-SF   Buffets II Flogesic Panagesic   Buffinol plain or Extra Strength Florinal or Florinal with Codeine Panwarfarin   Buf-Tabs Flurbiprofen Penicillamine   Butalbital Compound Four-way cold tablets Penicillin   Butazolidin Fragmin Pepto-Bismol   Carbenicillin Geminisyn Percodan   Carna Arthritis Reliever Geopen Persantine   Carprofen Gold's salt Persistin   Chloramphenicol Goody's Phenylbutazone   Chloromycetin Haltrain Piroxlcam   Clmetidine heparin Plaquenil   Cllnoril Hyco-pap Ponstel   Clofibrate Hydroxy chloroquine Propoxyphen         Before stopping any of these medications, be sure to consult the physician who ordered them.  Some, such as Coumadin (Warfarin) are ordered to prevent or treat serious conditions such as "deep thrombosis", "pumonary embolisms", and other heart problems.  The amount of time that you may need off of the medication may also vary with the medication and the reason for which you were taking it.  If you are taking any of these medications, please make sure you notify your pain physician before you undergo any procedures.          Trigger Point Injection Trigger points are areas where you have pain. A trigger point injection is a shot given in the trigger point to help relieve pain for a few days to a few months. Common places for trigger points include:  The neck.  The shoulders.  The upper back.  The lower back.  A trigger point injection will not cure long-lasting (chronic) pain permanently. These injections do not always work for every person, but for some people they can help to relieve pain for a few days to a few months. Tell a health care provider about:  Any allergies you have.  All medicines you are taking, including vitamins, herbs, eye drops, creams, and over-the-counter  medicines.  Any problems you or family members have had with anesthetic medicines.  Any blood disorders you have.  Any surgeries you have had.  Any medical conditions you have. What are the risks? Generally, this is a safe procedure. However, problems may occur, including:  Infection.  Bleeding.  Allergic reaction to the injected medicine.  Irritation of the skin around the injection site.  What happens before the procedure?  Ask your health care provider about changing or stopping your regular medicines. This is especially important if you are taking  diabetes medicines or blood thinners. What happens during the procedure?  Your health care provider will feel for trigger points. A marker may be used to circle the area for the injection.  The skin over the trigger point will be washed with a germ-killing (antiseptic) solution.  A thin needle is used for the shot. You may feel pain or a twitching feeling when the needle enters the trigger point.  A numbing solution may be injected into the trigger point. Sometimes a medicine to keep down swelling, redness, and warmth (inflammation) is also injected.  Your health care provider may move the needle around the area where the trigger point is located until the tightness and twitching goes away.  After the injection, your health care provider may put gentle pressure over the injection site.  The injection site will be covered with a bandage (dressing). The procedure may vary among health care providers and hospitals. What happens after the procedure?  The dressing can be taken off in a few hours or as told by your health care provider.  You may feel sore and stiff for 1-2 days. This information is not intended to replace advice given to you by your health care provider. Make sure you discuss any questions you have with your health care provider. Document Released: 08/12/2011 Document Revised: 04/25/2016 Document Reviewed:  02/10/2015 Elsevier Interactive Patient Education  2018 [employer].

## 2017-06-30 NOTE — Telephone Encounter (Signed)
Patient called stating his scripts were called to [facility] and they are supposed to be called to [place]. Please call patient

## 2017-06-30 NOTE — Progress Notes (Signed)
Patient's Name: John [name]  MRN: [id number]  Referring Provider: Cletis Athens, MD  DOB: 1955/11/28  PCP: Cletis Athens, MD  DOS: 06/30/2017  Note by: Vevelyn Francois NP  Service setting: Ambulatory outpatient  Specialty: Interventional Pain Management  Location: [facility] (AMB) Pain Management Facility    Patient type: Established    Primary Reason(s) for Visit: Encounter for prescription drug management. (Level of risk: moderate)  CC: Back Pain (low) and Leg Pain (bilateral and posterior)  HPI  Mr. Corella is a 61 y.o. year old, male patient, who comes today for a medication management evaluation. He has Encounter for therapeutic drug level monitoring; Long term current use of opiate analgesic; Opiate use (7.5 MME/Day); Lumbar spondylosis; Chronic low back pain (Location of Primary Source of Pain) (Bilateral) (L>R); IVP/radiological dye Allergy; Thrombocytopenia ([place]); History of alcoholism; Carpal tunnel syndrome; Clinical depression; Difficulty in walking; Dystonia; Gastric ulcer; HCV (hepatitis C virus); H/O neoplasm; BP (high blood pressure); Decreased leukocytes; Hepatic cirrhosis ([place]); Loss of feeling or sensation; Absence of sensation; Current tobacco use; Has a tremor; Osteoarthritis of spine with radiculopathy, lumbosacral region; Peripheral neuropathy (Alcoh[place]); Chronic neck pain (Location of Tertiary source of pain) (Bilateral) (R>L); Cervical spondylosis; Obstructive sleep apnea; History of panic attacks; GERD (gastroesophageal reflux disease); Chronic constipation; 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[place]); Leukopenia; Lumbar facet syndrome (Location of Primary Source of Pain) (Bilateral) (L>R); Chronic lumbar radicular pain (Right S1 and Left L5) (Location of Secondary source of pain) (Bilateral) (L>R); Cervical radicular pain (Location of Tertiary source of pain) (Bilateral) (R>L); Chronic obstructive pulmonary disease (COPD) ([place]); Head injury; Epileptic disorder ([place]); Long term  prescription opiate use; Encounter for chronic pain management; Radicular pain of shoulder (Bilateral) (R>L); Opioid-induced constipation ([place]); Personal history of tobacco use, presenting hazards to health; Neurogenic pain; Musculoskeletal pain; Chronic lower extremity pain (Location of Secondary source of pain) (Bilateral) (L>R); Chronic upper extremity pain (Location of Tertiary source of pain) (Bilateral) (R>L); Abnormal MRI, lumbar spine (2015); Lumbar central spinal stenosis (Severe at L4-5; L5-S1); Lumbar lateral recess stenosis (Bilateral L>R at L4-5; Left sided at L5-S1); Lumbar foraminal stenosis (Left L5-S1); Lumbar facet hypertrophy (L4-5 & L5-S1 L>R); Abnormal MRI, cervical spine (2014); Cervical foraminal stenosis (Right sided C3-4 & C4-5) (Left-sided C5-6 and C7-T1) (Bilateral C6-7); Splenomegaly; Chronic pain syndrome; and Chronic thumb pain, left on his problem list. His primarily concern today is the Back Pain (low) and Leg Pain (bilateral and posterior)  Pain Assessment: Location: Lower, Left, Right Back Radiating: legs Onset: More than a month ago Duration: Chronic pain Quality: Cramping, Sharp, Stabbing Severity: 4 /10 (self-reported pain score)  Note: Reported level is compatible with observation.                    Effect on ADL:   Timing: Constant Modifying factors: medications, rest  Mr. Mane was last scheduled for an appointment on 03/31/2017 for medication management. During today's appointment we reviewed Mr. Rode chronic pain status, as well as his outpatient medication regimen. He admits that his back pain is stable. He is concern about the stiffness and pain in his thumb. He admits that this pain has been going for some time. He was told that he has deterioration of his joint. He admits that it is painful and it is locking up on him and would like to know if we can treat this pain.   The patient  reports that he does not use drugs. His body mass index is 25.09  kg/m.  Further  details on both, my assessment(s), as well as the proposed treatment plan, please see below.  Controlled Substance Pharmacotherapy Assessment REMS (Risk Evaluation and Mitigation Strategy)  Analgesic:Oxycodone 2.5 one every 12 hours (5 mg/day of oxycodone) MME/day:7.5 mg/day Hart Rochester, RN  06/30/2017  9:41 AM  Sign at close encounter Nursing Pain Medication Assessment:  Safety precautions to be maintained throughout the outpatient stay will include: orient to surroundings, keep bed in low position, maintain call bell within reach at all times, provide assistance with transfer out of bed and ambulation.  Medication Inspection Compliance: Pill count conducted under aseptic conditions, in front of the patient. Neither the pills nor the bottle was removed from the patient's sight at any time. Once count was completed pills were immediately returned to the patient in their original bottle.  Medication: Oxycodone IR Pill/Patch Count: 21 of 30 pills remain Pill/Patch Appearance: Markings consistent with prescribed medication Bottle Appearance: Standard pharmacy container. Clearly labeled. Filled Date: 3 / 15 / 2018 Last Medication intake:  Today   Pharmacokinetics: Liberation and absorption (onset of action): WNL Distribution (time to peak effect): WNL Metabolism and excretion (duration of action): WNL         Pharmacodynamics: Desired effects: Analgesia: Mr. Darnell reports >50% benefit. Functional ability: Patient reports that medication allows him to accomplish basic ADLs Clinically meaningful improvement in function (CMIF): Sustained CMIF goals met Perceived effectiveness: Described as relatively effective, allowing for increase in activities of daily living (ADL) Undesirable effects: Side-effects or Adverse reactions: None reported Monitoring: [place] PMP: Online review of the past 24-monthperiod conducted. Compliant with practice rules and regulations Last UDS  on record: No results found for: SUMMARY UDS interpretation: Compliant          Medication Assessment Form: Reviewed. Patient indicates being compliant with therapy Treatment compliance: Compliant Risk Assessment Profile: Aberrant behavior: See prior evaluations. None observed or detected today Comorbid factors increasing risk of overdose: See prior notes. No additional risks detected today Risk of substance use disorder (SUD): Low  ORT Scoring interpretation table:  Score <3 = Low Risk for SUD  Score between 4-7 = Moderate Risk for SUD  Score >8 = High Risk for Opioid Abuse   Risk Mitigation Strategies:  Patient Counseling: Covered Patient-Prescriber Agreement (PPA): Present and active  Notification to other healthcare providers: Done  Pharmacologic Plan: No change in therapy, at this time  Laboratory Chemistry  Inflammation Markers (CRP: Acute Phase) (ESR: Chronic Phase) Lab Results  Component Value Date   CRP 0.5 09/22/2015   ESRSEDRATE 15 09/22/2015                 Renal Function Markers Lab Results  Component Value Date   BUN 11 05/19/2017   CREATININE 0.68 05/19/2017   GFRAA >60 05/19/2017   GFRNONAA >60 05/19/2017                 Hepatic Function Markers Lab Results  Component Value Date   AST 27 05/19/2017   ALT 17 05/19/2017   ALBUMIN 4.4 05/19/2017   ALKPHOS 58 05/19/2017   HCVAB <0.1 11/06/2015                 Electrolytes Lab Results  Component Value Date   NA 137 05/19/2017   K 4.1 05/19/2017   CL 100 (L) 05/19/2017   CALCIUM 8.9 05/19/2017   MG 1.9 09/22/2015                 Neuropathy Markers  Lab Results  Component Value Date   TKZSWFUX32 355 08/08/2015                 Bone Pathology Markers Lab Results  Component Value Date   ALKPHOS 58 05/19/2017   CALCIUM 8.9 05/19/2017                 Coagulation Parameters Lab Results  Component Value Date   INR 1.02 05/19/2017   LABPROT 13.3 05/19/2017   APTT 33 08/08/2015   PLT 76  (L) 05/19/2017                 Cardiovascular Markers Lab Results  Component Value Date   HGB 13.2 05/19/2017   HCT 37.4 (L) 05/19/2017                 Note: Lab results reviewed.  Recent Diagnostic Imaging Results  US Abdomen Complete CLINICAL DATA:  Cirrhosis, evaluate for mass  EXAM: ABDOMEN ULTRASOUND COMPLETE  COMPARISON:  None.  FINDINGS: Gallbladder: Cholelithiasis without gallbladder wall thickening or pericholecystic fluid. Negative sonographic Murphy sign.  Common bile duct: Diameter: 5.2 mm  Liver: 1.6 x 1.2 x 1.4 cm anechoic right hepatic mass most consistent with a cyst. No solid hepatic mass. Coarse hepatic echotexture with a slightly nodular contour or as can be seen with cirrhosis. Portal vein is patent on color Doppler imaging with normal direction of blood flow towards the liver.  IVC: No abnormality visualized.  Pancreas: Visualized portion unremarkable.  Spleen: Spleen measures 15 cm in length with a volume of 755 mL.  Right Kidney: Length: 12.3 cm. Echogenicity within normal limits. No mass or hydronephrosis visualized.  Left Kidney: Length: 10.6 cm. Echogenicity within normal limits. No mass or hydronephrosis visualized.  Abdominal aorta: Abdominal aortic ectasia measuring 2.7 cm.  Other findings: None.  IMPRESSION: 1. Cirrhotic liver without a focal hepatic mass. 2. Mild splenomegaly. 3. Abdominal aortic ectasia. Ectatic abdominal aorta, at risk for aneurysm development. Recommend follow-up aortic ultrasound in 5 years. This recommendation follows ACR consensus guidelines: White Paper of the ACR Incidental Findings Committee II on Vascular Findings. J Am Coll Radiol 2013; 10:789-794.  Electronically Signed   By: Kathreen Devoid   On: 05/25/2017 11:11  Complexity Note: Imaging results reviewed.        Results visible under "Care Everywhere".            Meds   Current Outpatient Prescriptions:  .  ALPRAZolam (XANAX) 0.25 MG tablet,  Take 0.25 mg by mouth at bedtime as needed for anxiety., Disp: , Rfl:  .  [START ON 07/18/2017] baclofen (LIORESAL) 10 MG tablet, Take 1 tablet (10 mg total) by mouth 3 (three) times daily., Disp: 270 tablet, Rfl: 0 .  folic acid (FOLVITE) 1 MG tablet, Take 1 mg by mouth daily. , Disp: , Rfl:  .  [START ON 07/18/2017] gabapentin (NEURONTIN) 300 MG capsule, Take 1 capsule (300 mg total) by mouth 2 (two) times daily., Disp: 180 capsule, Rfl: 0 .  Magnesium Oxide 500 MG CAPS, Take 1 capsule (500 mg total) by mouth 2 (two) times daily at 8 am and 10 pm., Disp: 180 capsule, Rfl: 0 .  [START ON 07/18/2017] oxyCODONE (OXY IR/ROXICODONE) 5 MG immediate release tablet, Take 0.5 tablets (2.5 mg total) by mouth 2 (two) times daily., Disp: 30 tablet, Rfl: 0 .  [START ON 08/17/2017] oxyCODONE (OXY IR/ROXICODONE) 5 MG immediate release tablet, Take 0.5 tablets (2.5 mg total) by mouth 2 (two) times  daily., Disp: 30 tablet, Rfl: 0 .  [START ON 09/16/2017] oxyCODONE (OXY IR/ROXICODONE) 5 MG immediate release tablet, Take 0.5 tablets (2.5 mg total) by mouth 2 (two) times daily., Disp: 30 tablet, Rfl: 0 .  vitamin B-12 (CYANOCOBALAMIN) 500 MCG tablet, Take 500 mcg by mouth daily., Disp: , Rfl:   ROS  Constitutional: Denies any fever or chills Gastrointestinal: No reported hemesis, hematochezia, vomiting, or acute GI distress Musculoskeletal: Denies any acute onset joint swelling, redness, loss of ROM, or weakness Neurological: No reported episodes of acute onset apraxia, aphasia, dysarthria, agnosia, amnesia, paralysis, loss of coordination, or loss of consciousness  Allergies  Mr. Bangs is allergic to codeine sulfate; morphine; and duloxetine.  [place]  Drug: Mr. Yom  reports that he does not use drugs. Alcohol:  reports that he does not drink alcohol. Tobacco:  reports that he has been smoking Cigarettes.  He has a 17.50 pack-year smoking history. He has never used smokeless tobacco. Medical:  has a past  medical history of Anxiety; Back ache (05/06/2014); Cervical pain (05/06/2014); COPD (chronic obstructive pulmonary disease) ([place]); Depression; Dystonia; Extremity pain (05/06/2014); Febrile seizures ([place]); Foot pain (09/11/2014); Gout; Head injury (06/23/2015); Hemorrhoids; Hepatitis B; History of GI bleed; Hypertension; Leukopenia; Osteoarthritis of spine with radiculopathy, lumbar region (06/23/2015); Peripheral neuropathy (06/23/2015); Personal history of tobacco use, presenting hazards to health (01/09/2016); Pulse irregularity; Seizures ([place]); Spinal stenosis; and Uncomplicated opioid dependence ([place]) (06/23/2015). Surgical: Mr. Mehring  has a past surgical history that includes surgery for cervical neck fracture; Esophagogastroduodenoscopy (11/2013); and Tooth Extraction (Right, 09/28/2016). Family: family history includes Cancer in his father; Cancer (age of onset: 67) in his brother; Heart disease in his father; Stroke in his father and mother.  Constitutional Exam  General appearance: Well nourished, well developed, and well hydrated. In no apparent acute distress Vitals:   06/30/17 0934  BP: (!) 111/58  Pulse: (!) 57  Resp: 18  Temp: 98.3 F (36.8 C)  TempSrc: Oral  SpO2: 99%  Weight: 185 lb (83.9 kg)  Height: 6' (1.829 m)   BMI Assessment: Estimated body mass index is 25.09 kg/m as calculated from the following:   Height as of this encounter: 6' (1.829 m).   Weight as of this encounter: 185 lb (83.9 kg). Psych/Mental status: Alert, oriented x 3 (person, place, & time)       Eyes: PERLA Respiratory: No evidence of acute respiratory distress  Cervical Spine Area Exam  Skin & Axial Inspection: No masses, redness, edema, swelling, or associated skin lesions Alignment: Symmetrical Functional ROM: Unrestricted ROM      Stability: No instability detected Muscle Tone/Strength: Functionally intact. No obvious neuro-muscular anomalies detected. Sensory (Neurological): Unimpaired Palpation:  No palpable anomalies              Upper Extremity (UE) Exam    Side: Right upper extremity  Side: Left upper extremity  Skin & Extremity Inspection: Skin color, temperature, and hair growth are WNL. No peripheral edema or cyanosis. No masses, redness, swelling, asymmetry, or associated skin lesions. No contractures.  Skin & Extremity Inspection: Skin color, temperature, and hair growth are WNL. No peripheral edema or cyanosis. No masses, redness, swelling, asymmetry, or associated skin lesions. No contractures.  Functional ROM: Unrestricted ROM          Functional ROM: Unrestricted ROM          Muscle Tone/Strength: Functionally intact. No obvious neuro-muscular anomalies detected.  Muscle Tone/Strength: Functionally intact. No obvious neuro-muscular anomalies detected.  Sensory (Neurological):  Unimpaired          Sensory (Neurological): Unimpaired          Palpation: No palpable anomalies              Palpation: No palpable anomalies              Specialized Test(s): Deferred         Specialized Test(s): Deferred          Thoracic Spine Area Exam  Skin & Axial Inspection: No masses, redness, or swelling Alignment: Symmetrical Functional ROM: Unrestricted ROM Stability: No instability detected Muscle Tone/Strength: Functionally intact. No obvious neuro-muscular anomalies detected. Sensory (Neurological): Unimpaired Muscle strength & Tone: No palpable anomalies  Lumbar Spine Area Exam  Skin & Axial Inspection: No masses, redness, or swelling Alignment: Symmetrical Functional ROM: Unrestricted ROM      Stability: No instability detected Muscle Tone/Strength: Functionally intact. No obvious neuro-muscular anomalies detected. Sensory (Neurological): Unimpaired Palpation: No palpable anomalies       Provocative Tests: Lumbar Hyperextension and rotation test: evaluation deferred today       Lumbar Lateral bending test: evaluation deferred today       Patrick's Maneuver: evaluation deferred  today                    Gait & Posture Assessment  Ambulation: Unassisted Gait: Relatively normal for age and body habitus Posture: WNL   Lower Extremity Exam    Side: Right lower extremity  Side: Left lower extremity  Skin & Extremity Inspection: Skin color, temperature, and hair growth are WNL. No peripheral edema or cyanosis. No masses, redness, swelling, asymmetry, or associated skin lesions. No contractures.  Skin & Extremity Inspection: Skin color, temperature, and hair growth are WNL. No peripheral edema or cyanosis. No masses, redness, swelling, asymmetry, or associated skin lesions. No contractures.  Functional ROM: Unrestricted ROM          Functional ROM: Unrestricted ROM          Muscle Tone/Strength: Functionally intact. No obvious neuro-muscular anomalies detected.  Muscle Tone/Strength: Functionally intact. No obvious neuro-muscular anomalies detected.  Sensory (Neurological): Unimpaired  Sensory (Neurological): Unimpaired  Palpation: No palpable anomalies  Palpation: No palpable anomalies   Assessment  Primary Diagnosis & Pertinent Problem List: The primary encounter diagnosis was Chronic thumb pain, left. Diagnoses of Chronic low back pain (Location of Primary Source of Pain) (Bilateral) (L>R), Chronic lumbar radicular pain (Right S1 and Left L5) (Location of Secondary source of pain) (Bilateral) (L>R), Musculoskeletal pain, Chronic pain syndrome, Chronic neck pain (Location of Tertiary source of pain) (Bilateral) (R>L), and Long term current use of opiate analgesic were also pertinent to this visit.  Status Diagnosis  Worsening Controlled Controlled 1. Chronic thumb pain, left   2. Chronic low back pain (Location of Primary Source of Pain) (Bilateral) (L>R)   3. Chronic lumbar radicular pain (Right S1 and Left L5) (Location of Secondary source of pain) (Bilateral) (L>R)   4. Musculoskeletal pain   5. Chronic pain syndrome   6. Chronic neck pain (Location of Tertiary  source of pain) (Bilateral) (R>L)   7. Long term current use of opiate analgesic     Problems updated and reviewed during this visit: No problems updated. Plan of Care  Pharmacotherapy (Medications Ordered): Meds ordered this encounter  Medications  . oxyCODONE (OXY IR/ROXICODONE) 5 MG immediate release tablet    Sig: Take 0.5 tablets (2.5 mg total) by mouth 2 (two) times daily.  Dispense:  30 tablet    Refill:  0    Do not place this medication, or any other prescription from our practice, on "Automatic Refill". Patient may have prescription filled one day early if pharmacy is closed on scheduled refill date. Do not fill until:07/18/2017 To last until: 08/17/2017    Order Specific Question:   Supervising Provider    Answer:   Milinda Pointer [phone]  . oxyCODONE (OXY IR/ROXICODONE) 5 MG immediate release tablet    Sig: Take 0.5 tablets (2.5 mg total) by mouth 2 (two) times daily.    Dispense:  30 tablet    Refill:  0    Do not place this medication, or any other prescription from our practice, on "Automatic Refill". Patient may have prescription filled one day early if pharmacy is closed on scheduled refill date. Do not fill until: 08/17/2017 To last until: 09/16/2017    Order Specific Question:   Supervising Provider    Answer:   Milinda Pointer [phone]  . oxyCODONE (OXY IR/ROXICODONE) 5 MG immediate release tablet    Sig: Take 0.5 tablets (2.5 mg total) by mouth 2 (two) times daily.    Dispense:  30 tablet    Refill:  0    Do not place this medication, or any other prescription from our practice, on "Automatic Refill". Patient may have prescription filled one day early if pharmacy is closed on scheduled refill date. Do not fill until: 09/16/2017 To last until:10/16/2017    Order Specific Question:   Supervising Provider    Answer:   Milinda Pointer [phone]  . baclofen (LIORESAL) 10 MG tablet    Sig: Take 1 tablet (10 mg total) by mouth 3 (three) times daily.     Dispense:  270 tablet    Refill:  0    Do not place this medication, or any other prescription from our practice, on "Automatic Refill". Patient may have prescription filled one day early if pharmacy is closed on scheduled refill date.    Order Specific Question:   Supervising Provider    Answer:   Milinda Pointer [phone]  . gabapentin (NEURONTIN) 300 MG capsule    Sig: Take 1 capsule (300 mg total) by mouth 2 (two) times daily.    Dispense:  180 capsule    Refill:  0    Order Specific Question:   Supervising Provider    Answer:   Milinda Pointer [phone]   New Prescriptions   No medications on file   Medications administered today: Mr. Rodier had no medications administered during this visit. Lab-work, procedure(s), and/or referral(s): Orders Placed This Encounter  Procedures  . Small Joint Injection/Arthrocentesis  . ToxASSURE Select 13 (MW), Urine   Imaging and/or referral(s): None  Interventional therapies: Planned, scheduled, and/or pending:   Left thumb steroid injection   Considering:  Diagnostic left L5-S1 lumbar epidural steroid injection  Diagnostic bilateral L4-5 transforaminal epidural steroid injection  Diagnostic bilateral lumbar facet block  Diagnostic right-sided cervical epidural steroid injection     Palliative PRN treatment(s):  Not at this time.   Provider-requested follow-up: Return in about 3 months (around 09/30/2017) for MedMgmt Dr , w/ Dr. Dossie Arbour, Procedure(NS), ([facility]).  Future Appointments Date Time Provider [place]  07/07/2017 9:00 AM Milinda Pointer, MD [facility] None  09/29/2017 8:45 AM Vevelyn Francois, NP [facility] None  11/17/2017 11:00 AM [facility] None  11/17/2017 11:15 AM Corcoran, Drue Second, MD [place] None   Primary Care Physician: Cletis Athens, MD Location:  [facility] Note by: Vevelyn Francois NP Date: 06/30/2017; Time: 11:22 AM  Pain Score Disclaimer: We use  the NRS-11 scale. This is a self-reported, subjective measurement of pain severity with only modest accuracy. It is used primarily to identify changes within a particular patient. It must be understood that outpatient pain scales are significantly less accurate that those used for research, where they can be applied under ideal controlled circumstances with minimal exposure to variables. In reality, the score is likely to be a combination of pain intensity and pain affect, where pain affect describes the degree of emotional arousal or changes in action readiness caused by the sensory experience of pain. Factors such as social and work situation, setting, emotional state, anxiety levels, expectation, and prior pain experience may influence pain perception and show large inter-individual differences that may also be affected by time variables.  Patient instructions provided during this appointment: Patient Instructions    ____________________________________________________________________________________________  Medication Rules  Applies to: All patients receiving prescriptions (written or electronic).  Pharmacy of record: Pharmacy where electronic prescriptions will be sent. If written prescriptions are taken to a different pharmacy, please inform the nursing staff. The pharmacy listed in the electronic medical record should be the one where you would like electronic prescriptions to be sent.  Prescription refills: Only during scheduled appointments. Applies to both, written and electronic prescriptions.  NOTE: The following applies primarily to controlled substances (Opioid* Pain Medications).   Patient's responsibilities: 1. Pain Pills: Bring all pain pills to every appointment (except for procedure appointments). 2. Pill Bottles: Bring pills in original pharmacy bottle. Always bring newest bottle. Bring bottle, even if empty. 3. Medication refills: You are responsible for knowing and keeping  track of what medications you need refilled. The day before your appointment, write a list of all prescriptions that need to be refilled. Bring that list to your appointment and give it to the admitting nurse. Prescriptions will be written only during appointments. If you forget a medication, it will not be "Called in", "Faxed", or "electronically sent". You will need to get another appointment to get these prescribed. 4. Prescription Accuracy: You are responsible for carefully inspecting your prescriptions before leaving our office. Have the discharge nurse carefully go over each prescription with you, before taking them home. Make sure that your name is accurately spelled, that your address is correct. Check the name and dose of your medication to make sure it is accurate. Check the number of pills, and the written instructions to make sure they are clear and accurate. Make sure that you are given enough medication to last until your next medication refill appointment. 5. Taking Medication: Take medication as prescribed. Never take more pills than instructed. Never take medication more frequently than prescribed. Taking less pills or less frequently is permitted and encouraged, when it comes to controlled substances (written prescriptions).  6. Inform other Doctors: Always inform, all of your healthcare providers, of all the medications you take. 7. Pain Medication from other Providers: You are not allowed to accept any additional pain medication from any other Doctor or Healthcare provider. There are two exceptions to this rule. (see below) In the event that you require additional pain medication, you are responsible for notifying us, as stated below. 8. Medication Agreement: You are responsible for carefully reading and following our Medication Agreement. This must be signed before receiving any prescriptions from our practice. Safely store a copy of your signed Agreement. Violations to the Agreement will  result in no further prescriptions. (Additional copies of our Medication Agreement are available upon request.) 9. Laws, Rules, & Regulations: All patients are expected to follow all Federal and [employer], [employer], Rules, [employer] [facility]. Ignorance of the Laws does not constitute a valid excuse. The use of any illegal substances is prohibited. 10. Adopted CDC guidelines & recommendations: Target dosing levels will be at or below 60 MME/day. Use of benzodiazepines** is not recommended.  Exceptions: There are only two exceptions to the rule of not receiving pain medications from other Healthcare Providers. 1. Exception #1 (Emergencies): In the event of an emergency (i.e.: accident requiring emergency care), you are allowed to receive additional pain medication. However, you are responsible for: As soon as you are able, call our office [phone], at any time of the day or night, and leave a message stating your name, the date and nature of the emergency, and the name and dose of the medication prescribed. In the event that your call is answered by a member of our staff, make sure to document and save the date, time, and the name of the person that took your information.  2. Exception #2 (Planned Surgery): In the event that you are scheduled by another doctor or dentist to have any type of surgery or procedure, you are allowed (for a period no longer than 30 days), to receive additional pain medication, for the acute post-op pain. However, in this case, you are responsible for picking up a copy of our "Post-op Pain Management for Surgeons" handout, and giving it to your surgeon or dentist. This document is available at our office, and does not require an appointment to obtain it. Simply go to our office during business hours (Monday-Thursday from 8:00 AM to 4:00 PM) (Friday 8:00 AM to 12:00 Noon) or if you have a scheduled appointment with [place], prior to your surgery, and ask for it by name. In addition, you  will need to provide [place] with your name, name of your surgeon, type of surgery, and date of procedure or surgery.  *Opioid medications include: morphine, codeine, oxycodone, oxymorphone, hydrocodone, hydromorphone, meperidine, tramadol, tapentadol, buprenorphine, fentanyl, methadone. **Benzodiazepine medications include: diazepam (Valium), alprazolam (Xanax), clonazepam (Klonopine), lorazepam (Ativan), clorazepate (Tranxene), chlordiazepoxide (Librium), estazolam (Prosom), oxazepam (Serax), temazepam (Restoril), triazolam (Halcion)  ____________________________________________________________________________________________ Pain Management Discharge Instructions  General Discharge Instructions :  If you need to reach your doctor call: Monday-Friday 8:00 am - 4:00 pm at [phone] or toll free [phone].  After clinic hours [phone] to have operator reach doctor.  Bring all of your medication bottles to all your appointments in the pain clinic.  To cancel or reschedule your appointment with Pain Management please remember to call 24 hours in advance to avoid a fee.  Refer to the educational materials which you have been given on: General Risks, I had my Procedure. Discharge Instructions, Post Sedation.  Post Procedure Instructions:  The drugs you were given will stay in your system until tomorrow, so for the next 24 hours you should not drive, make any legal decisions or drink any alcoholic beverages.  You may eat anything you prefer, but it is better to start with liquids then soups and crackers, and gradually work up to solid foods.  Please notify your doctor immediately if you have any unusual bleeding, trouble breathing or pain that is not related to your normal pain.  Depending on the type of procedure that was done, some parts of your body may feel week and/or numb.  This usually clears up by tonight or the next day.  Walk with the use of an assistive device or accompanied  by an adult for the 24 hours.  You may use ice on the affected area for the first 24 hours.  Put ice in a Ziploc bag and cover with a towel and place against area 15 minutes on 15 minutes off.  You may switch to heat after 24 hours.GENERAL RISKS AND COMPLICATIONS  What are the risk, side effects and possible complications? Generally speaking, most procedures are safe.  However, with any procedure there are risks, side effects, and the possibility of complications.  The risks and complications are dependent upon the sites that are lesioned, or the type of nerve block to be performed.  The closer the procedure is to the spine, the more serious the risks are.  Great care is taken when placing the radio frequency needles, block needles or lesioning probes, but sometimes complications can occur. 1. Infection: Any time there is an injection through the skin, there is a risk of infection.  This is why sterile conditions are used for these blocks.  There are four possible types of infection. 1. Localized skin infection. 2. Central Nervous System Infection-This can be in the form of Meningitis, which can be deadly. 3. Epidural Infections-This can be in the form of an epidural abscess, which can cause pressure inside of the spine, causing compression of the spinal cord with subsequent paralysis. This would require an emergency surgery to decompress, and there are no guarantees that the patient would recover from the paralysis. 4. Discitis-This is an infection of the intervertebral discs.  It occurs in about 1% of discography procedures.  It is difficult to treat and it may lead to surgery.        2. Pain: the needles have to go through skin and soft tissues, will cause soreness.       3. Damage to internal structures:  The nerves to be lesioned may be near blood vessels or    other nerves which can be potentially damaged.       4. Bleeding: Bleeding is more common if the patient is taking blood thinners such as   aspirin, Coumadin, Ticiid, Plavix, etc., or if he/she have some genetic predisposition  such as hemophilia. Bleeding into the spinal canal can cause compression of the spinal  cord with subsequent paralysis.  This would require an emergency surgery to  decompress and there are no guarantees that the patient would recover from the  paralysis.       5. Pneumothorax:  Puncturing of a lung is a possibility, every time a needle is introduced in  the area of the chest or upper back.  Pneumothorax refers to free air around the  collapsed lung(s), inside of the thoracic cavity (chest cavity).  Another two possible  complications related to a similar event would include: Hemothorax and Chylothorax.   These are variations of the Pneumothorax, where instead of air around the collapsed  lung(s), you may have blood or chyle, respectively.       6. Spinal headaches: They may occur with any procedures in the area of the spine.       7. Persistent CSF (Cerebro-Spinal Fluid) leakage: This is a rare problem, but may occur  with prolonged intrathecal or epidural catheters either due to the formation of a fistulous  track or a dural tear.       8. Nerve damage: By working so  close to the spinal cord, there is always a possibility of  nerve damage, which could be as serious as a permanent spinal cord injury with  paralysis.       9. Death:  Although rare, severe deadly allergic reactions known as "Anaphylactic  reaction" can occur to any of the medications used.      10. Worsening of the symptoms:  We can always make thing worse.  What are the chances of something like this happening? Chances of any of this occuring are extremely low.  By statistics, you have more of a chance of getting killed in a motor vehicle accident: while driving to the hospital than any of the above occurring .  Nevertheless, you should be aware that they are possibilities.  In general, it is similar to taking a shower.  Everybody knows that you can  slip, hit your head and get killed.  Does that mean that you should not shower again?  Nevertheless always keep in mind that statistics do not mean anything if you happen to be on the wrong side of them.  Even if a procedure has a 1 (one) in a 1,000,000 (million) chance of going wrong, it you happen to be that one..Also, keep in mind that by statistics, you have more of a chance of having something go wrong when taking medications.  Who should not have this procedure? If you are on a blood thinning medication (e.g. Coumadin, Plavix, see list of "Blood Thinners"), or if you have an active infection going on, you should not have the procedure.  If you are taking any blood thinners, please inform your physician.  How should I prepare for this procedure?  Do not eat or drink anything at least six hours prior to the procedure.  Bring a driver with you .  It cannot be a taxi.  Come accompanied by an adult that can drive you back, and that is strong enough to help you if your legs get weak or numb from the local anesthetic.  Take all of your medicines the morning of the procedure with just enough water to swallow them.  If you have diabetes, make sure that you are scheduled to have your procedure done first thing in the morning, whenever possible.  If you have diabetes, take only half of your insulin dose and notify our nurse that you have done so as soon as you arrive at the clinic.  If you are diabetic, but only take blood sugar pills (oral hypoglycemic), then do not take them on the morning of your procedure.  You may take them after you have had the procedure.  Do not take aspirin or any aspirin-containing medications, at least eleven (11) days prior to the procedure.  They may prolong bleeding.  Wear loose fitting clothing that may be easy to take off and that you would not mind if it got stained with Betadine or blood.  Do not wear any jewelry or perfume  Remove any nail coloring.  It will  interfere with some of our monitoring equipment.  NOTE: Remember that this is not meant to be interpreted as a complete list of all possible complications.  Unforeseen problems may occur.  BLOOD THINNERS The following drugs contain aspirin or other products, which can cause increased bleeding during surgery and should not be taken for 2 weeks prior to and 1 week after surgery.  If you should need take something for relief of minor pain, you may take acetaminophen which  is found in Tylenol,m Datril, Anacin-3 and Panadol. It is not blood thinner. The products listed below are.  Do not take any of the products listed below in addition to any listed on your instruction sheet.  A.P.C or A.P.C with Codeine Codeine Phosphate Capsules #3 Ibuprofen Ridaura  ABC compound Congesprin Imuran rimadil  Advil Cope Indocin Robaxisal  Alka-Seltzer Effervescent Pain Reliever and Antacid Coricidin or Coricidin-D  Indomethacin Rufen  Alka-Seltzer plus Cold Medicine Cosprin Ketoprofen S-A-C Tablets  Anacin Analgesic Tablets or Capsules Coumadin Korlgesic Salflex  Anacin Extra Strength Analgesic tablets or capsules CP-2 Tablets Lanoril Salicylate  Anaprox Cuprimine Capsules Levenox Salocol  Anexsia-D Dalteparin Magan Salsalate  Anodynos Darvon compound Magnesium Salicylate Sine-off  Ansaid Dasin Capsules Magsal Sodium Salicylate  Anturane Depen Capsules Marnal Soma  APF Arthritis pain formula Dewitt's Pills Measurin Stanback  Argesic Dia-Gesic Meclofenamic Sulfinpyrazone  Arthritis Bayer Timed Release Aspirin Diclofenac Meclomen Sulindac  Arthritis pain formula Anacin Dicumarol Medipren Supac  Analgesic (Safety coated) Arthralgen Diffunasal Mefanamic Suprofen  Arthritis Strength Bufferin Dihydrocodeine Mepro Compound Suprol  Arthropan liquid Dopirydamole Methcarbomol with Aspirin Synalgos  ASA tablets/Enseals Disalcid Micrainin Tagament  Ascriptin Doan's Midol Talwin  Ascriptin A/D Dolene Mobidin Tanderil    Ascriptin Extra Strength Dolobid Moblgesic Ticlid  Ascriptin with Codeine Doloprin or Doloprin with Codeine Momentum Tolectin  Asperbuf Duoprin Mono-gesic Trendar  Aspergum Duradyne Motrin or Motrin IB Triminicin  Aspirin plain, buffered or enteric coated Durasal Myochrisine Trigesic  Aspirin Suppositories Easprin Nalfon Trillsate  Aspirin with Codeine Ecotrin Regular or Extra Strength Naprosyn Uracel  Atromid-S Efficin Naproxen Ursinus  Auranofin Capsules Elmiron Neocylate Vanquish  Axotal Emagrin Norgesic Verin  Azathioprine Empirin or Empirin with Codeine Normiflo Vitamin E  Azolid Emprazil Nuprin Voltaren  Bayer Aspirin plain, buffered or children's or timed BC Tablets or powders Encaprin Orgaran Warfarin Sodium  Buff-a-Comp Enoxaparin Orudis Zorpin  Buff-a-Comp with Codeine Equegesic Os-Cal-Gesic   Buffaprin Excedrin plain, buffered or Extra Strength Oxalid   Bufferin Arthritis Strength Feldene Oxphenbutazone   Bufferin plain or Extra Strength Feldene Capsules Oxycodone with Aspirin   Bufferin with Codeine Fenoprofen Fenoprofen Pabalate or Pabalate-SF   Buffets II Flogesic Panagesic   Buffinol plain or Extra Strength Florinal or Florinal with Codeine Panwarfarin   Buf-Tabs Flurbiprofen Penicillamine   Butalbital Compound Four-way cold tablets Penicillin   Butazolidin Fragmin Pepto-Bismol   Carbenicillin Geminisyn Percodan   Carna Arthritis Reliever Geopen Persantine   Carprofen Gold's salt Persistin   Chloramphenicol Goody's Phenylbutazone   Chloromycetin Haltrain Piroxlcam   Clmetidine heparin Plaquenil   Cllnoril Hyco-pap Ponstel   Clofibrate Hydroxy chloroquine Propoxyphen         Before stopping any of these medications, be sure to consult the physician who ordered them.  Some, such as Coumadin (Warfarin) are ordered to prevent or treat serious conditions such as "deep thrombosis", "pumonary embolisms", and other heart problems.  The amount of time that you may need off  of the medication may also vary with the medication and the reason for which you were taking it.  If you are taking any of these medications, please make sure you notify your pain physician before you undergo any procedures.          Trigger Point Injection Trigger points are areas where you have pain. A trigger point injection is a shot given in the trigger point to help relieve pain for a few days to a few months. Common places for trigger points include:  The neck.  The shoulders.  The upper back.  The lower back.  A trigger point injection will not cure long-lasting (chronic) pain permanently. These injections do not always work for every person, but for some people they can help to relieve pain for a few days to a few months. Tell a health care provider about:  Any allergies you have.  All medicines you are taking, including vitamins, herbs, eye drops, creams, and over-the-counter medicines.  Any problems you or family members have had with anesthetic medicines.  Any blood disorders you have.  Any surgeries you have had.  Any medical conditions you have. What are the risks? Generally, this is a safe procedure. However, problems may occur, including:  Infection.  Bleeding.  Allergic reaction to the injected medicine.  Irritation of the skin around the injection site.  What happens before the procedure?  Ask your health care provider about changing or stopping your regular medicines. This is especially important if you are taking diabetes medicines or blood thinners. What happens during the procedure?  Your health care provider will feel for trigger points. A marker may be used to circle the area for the injection.  The skin over the trigger point will be washed with a germ-killing (antiseptic) solution.  A thin needle is used for the shot. You may feel pain or a twitching feeling when the needle enters the trigger point.  A numbing solution may be injected  into the trigger point. Sometimes a medicine to keep down swelling, redness, and warmth (inflammation) is also injected.  Your health care provider may move the needle around the area where the trigger point is located until the tightness and twitching goes away.  After the injection, your health care provider may put gentle pressure over the injection site.  The injection site will be covered with a bandage (dressing). The procedure may vary among health care providers and hospitals. What happens after the procedure?  The dressing can be taken off in a few hours or as told by your health care provider.  You may feel sore and stiff for 1-2 days. This information is not intended to replace advice given to you by your health care provider. Make sure you discuss any questions you have with your health care provider. Document Released: 08/12/2011 Document Revised: 04/25/2016 Document Reviewed: 02/10/2015 Elsevier Interactive Patient Education  2018 [employer].

## 2017-06-30 NOTE — Progress Notes (Signed)
Nursing Pain Medication Assessment:  Safety precautions to be maintained throughout the outpatient stay will include: orient to surroundings, keep bed in low position, maintain call bell within reach at all times, provide assistance with transfer out of bed and ambulation.  Medication Inspection Compliance: Pill count conducted under aseptic conditions, in front of the patient. Neither the pills nor the bottle was removed from the patient's sight at any time. Once count was completed pills were immediately returned to the patient in their original bottle.  Medication: Oxycodone IR Pill/Patch Count: 21 of 30 pills remain Pill/Patch Appearance: Markings consistent with prescribed medication Bottle Appearance: Standard pharmacy container. Clearly labeled. Filled Date: 25 / 15 / 2018 Last Medication intake:  Today

## 2017-06-30 NOTE — Telephone Encounter (Signed)
Will do!

## 2017-06-30 NOTE — Telephone Encounter (Signed)
Crystal, I have changed the pharmacy in the chart to [facility]. Will you please re send them?

## 2017-07-06 LAB — TOXASSURE SELECT 13 (MW), URINE

## 2017-07-07 ENCOUNTER — Encounter: Payer: Self-pay | Admitting: Pain Medicine

## 2017-07-07 ENCOUNTER — Ambulatory Visit: Payer: Medicare PPO | Attending: Pain Medicine | Admitting: Pain Medicine

## 2017-07-07 VITALS — BP 129/65 | HR 58 | Temp 97.8°F | Resp 16 | Ht 72.0 in | Wt 186.0 lb

## 2017-07-07 DIAGNOSIS — Z885 Allergy status to narcotic agent status: Secondary | ICD-10-CM | POA: Insufficient documentation

## 2017-07-07 DIAGNOSIS — M79645 Pain in left finger(s): Secondary | ICD-10-CM | POA: Insufficient documentation

## 2017-07-07 DIAGNOSIS — Z882 Allergy status to sulfonamides status: Secondary | ICD-10-CM | POA: Diagnosis not present

## 2017-07-07 DIAGNOSIS — G8929 Other chronic pain: Secondary | ICD-10-CM | POA: Diagnosis not present

## 2017-07-07 DIAGNOSIS — M654 Radial styloid tenosynovitis [de Quervain]: Secondary | ICD-10-CM | POA: Diagnosis not present

## 2017-07-07 DIAGNOSIS — Z87898 Personal history of other specified conditions: Secondary | ICD-10-CM | POA: Insufficient documentation

## 2017-07-07 HISTORY — DX: Radial styloid tenosynovitis (de quervain): M65.4

## 2017-07-07 MED ORDER — ROPIVACAINE HCL 2 MG/ML IJ SOLN
9.0000 mL | Freq: Once | INTRAMUSCULAR | Status: AC
  Administered 2017-07-07: 10 mL via PERINEURAL
  Filled 2017-07-07: qty 10

## 2017-07-07 MED ORDER — LIDOCAINE HCL 2 % IJ SOLN
10.0000 mL | Freq: Once | INTRAMUSCULAR | Status: AC
  Administered 2017-07-07: 400 mg
  Filled 2017-07-07: qty 20

## 2017-07-07 MED ORDER — METHYLPREDNISOLONE ACETATE 80 MG/ML IJ SUSP
80.0000 mg | Freq: Once | INTRAMUSCULAR | Status: AC
  Administered 2017-07-07: 80 mg via INTRA_ARTICULAR
  Filled 2017-07-07: qty 1

## 2017-07-07 NOTE — Progress Notes (Signed)
Patient's Name: John [name]  MRN: [id number]  Referring Provider: Cletis Athens, MD  DOB: October 23, 1955  PCP: Cletis Athens, MD  DOS: 07/07/2017  Note by: Gaspar Cola, MD  Service setting: Ambulatory outpatient  Specialty: Interventional Pain Management  Patient type: Established  Location: [facility] (AMB) Pain Management Facility  Visit type: Interventional Procedure   Primary Reason for Visit: Interventional Pain Management Treatment. CC: Hand Pain (left thumb)  Procedure:  Anesthesia, Analgesia, Anxiolysis:  Type: Ligament/Tendon sheath (78295) Injection. Purpose: Diagnostic Level: Radial portion of the wrist Laterality: Left-side Position: Sitting Target Area: Extensor pollices longus (De Quervain's tenosynovitis) Region: Tendon sheath of the extensor policies long goals under the extensor retinaculum Approach: Percutanous  Type: Local Anesthesia Local Anesthetic: Lidocaine 1% Route: Infiltration ([place]/IM) IV Access: Declined Sedation: Declined  Indication(s): Analgesia           Indications: 1. Chronic thumb pain (Left)   2. De Quervain's tenosynovitis, left    Pain Score: Pre-procedure: 8 /10 Post-procedure: 0-No pain/10  Pre-op Assessment:  John [name] is a 61 y.o. (year old), male patient, seen today for interventional treatment. He  has a past surgical history that includes surgery for cervical neck fracture; Esophagogastroduodenoscopy (11/2013); and Tooth Extraction (Right, 09/28/2016). John [name] has a current medication list which includes the following prescription(s): alprazolam, baclofen, folic acid, gabapentin, magnesium oxide, oxycodone, oxycodone, oxycodone, and vitamin b-12. His primarily concern today is the Hand Pain (left thumb)  Initial Vital Signs: Blood pressure 130/74, pulse (!) 58, temperature 97.8 F (36.6 C), resp. rate 18, height 6' (1.829 m), weight 186 lb (84.4 kg), SpO2 96 %. BMI: Estimated body mass index is 25.23 kg/m as calculated from the  following:   Height as of this encounter: 6' (1.829 m).   Weight as of this encounter: 186 lb (84.4 kg).  Risk Assessment: Allergies: Reviewed. He is allergic to codeine sulfate; morphine; and duloxetine.  Allergy Precautions: None required Coagulopathies: Reviewed. None identified.  Blood-thinner therapy: None at this time Active Infection(s): Reviewed. None identified. John [name] is afebrile  Site Confirmation: John [name] was asked to confirm the procedure and laterality before marking the site Procedure checklist: Completed Consent: Before the procedure and under the influence of no sedative(s), amnesic(s), or anxiolytics, the patient was informed of the treatment options, risks and possible complications. To fulfill our ethical and legal obligations, as recommended by the American Medical Association's Code of Ethics, I have informed the patient of my clinical impression; the nature and purpose of the treatment or procedure; the risks, benefits, and possible complications of the intervention; the alternatives, including doing nothing; the risk(s) and benefit(s) of the alternative treatment(s) or procedure(s); and the risk(s) and benefit(s) of doing nothing. The patient was provided information about the general risks and possible complications associated with the procedure. These may include, but are not limited to: failure to achieve desired goals, infection, bleeding, organ or nerve damage, allergic reactions, paralysis, and death. In addition, the patient was informed of those risks and complications associated to the procedure, such as failure to decrease pain; infection; bleeding; organ or nerve damage with subsequent damage to sensory, motor, and/or autonomic systems, resulting in permanent pain, numbness, and/or weakness of one or several areas of the body; allergic reactions; (i.e.: anaphylactic reaction); and/or death. Furthermore, the patient was informed of those risks and  complications associated with the medications. These include, but are not limited to: allergic reactions (i.e.: anaphylactic or anaphylactoid reaction(s)); adrenal axis suppression; blood sugar elevation that in  diabetics may result in ketoacidosis or comma; water retention that in patients with history of congestive heart failure may result in shortness of breath, pulmonary edema, and decompensation with resultant heart failure; weight gain; swelling or edema; medication-induced neural toxicity; particulate matter embolism and blood vessel occlusion with resultant organ, and/or nervous system infarction; and/or aseptic necrosis of one or more joints. Finally, the patient was informed that Medicine is not an exact science; therefore, there is also the possibility of unforeseen or unpredictable risks and/or possible complications that may result in a catastrophic outcome. The patient indicated having understood very clearly. We have given the patient no guarantees and we have made no promises. Enough time was given to the patient to ask questions, all of which were answered to the patient's satisfaction. John [name] has indicated that he wanted to continue with the procedure. Attestation: I, the ordering provider, attest that I have discussed with the patient the benefits, risks, side-effects, alternatives, likelihood of achieving goals, and potential problems during recovery for the procedure that I have provided informed consent. Date: 07/07/2017; Time: 9:45 AM  Pre-Procedure Preparation:  Monitoring: As per clinic protocol. Respiration, ETCO2, SpO2, BP, heart rate and rhythm monitor placed and checked for adequate function Safety Precautions: Patient was assessed for positional comfort and pressure points before starting the procedure. Time-out: I initiated and conducted the "Time-out" before starting the procedure, as per protocol. The patient was asked to participate by confirming the accuracy of the "Time  Out" information. Verification of the correct person, site, and procedure were performed and confirmed by me, the nursing staff, and the patient. "Time-out" conducted as per Joint Commission's Universal Protocol (UP.01.01.01). "Time-out" Date & Time: 07/07/2017; 1009 hrs.  Description of Procedure Process:   Area Prepped: Extensor pollices longus Prepping solution: ChloraPrep (2% chlorhexidine gluconate and 70% isopropyl alcohol) Safety Precautions: Aspiration looking for blood return was conducted prior to all injections. At no point did we inject any substances, as a needle was being advanced. No attempts were made at seeking any paresthesias. Safe injection practices and needle disposal techniques used. Medications properly checked for expiration dates. SDV (single dose vial) medications used. Description of the Procedure: Protocol guidelines were followed. The patient was placed in position(Sitting). The target area was identified and prepped in the usual manner. Skin & deeper tissues infiltrated with local anesthetic. Appropriate time provided for local anesthetics to take effect. The procedure needle was slowly advanced to target area. Proper needle placement secured. Negative aspiration confirmed. Solution injected in intermittent fashion, asking for systemic symptoms every 0.5cc. Needle(s) removed and area cleaned, making sure to leave some prepping solution back to take advantage of its long term bactericidal properties. Vitals:   07/07/17 0907 07/07/17 1002 07/07/17 1012  BP: 130/74 116/60 129/65  Pulse: (!) 58    Resp: 18 16 16   Temp: 97.8 F (36.6 C)    SpO2: 96% 98% 99%  Weight: 186 lb (84.4 kg)    Height: 6' (1.829 m)      Start Time: 1009 hrs. End Time: 1012 hrs. Materials:  Needle(s) Type: Epidural needle Gauge: 25G Length: 1.5-in Medication(s): We administered lidocaine, ropivacaine (PF) 2 mg/mL (0.2%), and methylPREDNISolone acetate. Please see chart orders for dosing  details.  Imaging Guidance:  Type of Imaging Technique: None used Indication(s): N/A Exposure Time: No patient exposure Contrast: None used. Fluoroscopic Guidance: N/A Ultrasound Guidance: N/A Interpretation: N/A  Post-operative Assessment:  EBL: None Complications: No immediate post-treatment complications observed by team, or reported by patient.  Note: The patient tolerated the entire procedure well. A repeat set of vitals were taken after the procedure and the patient was kept under observation following institutional policy, for this type of procedure. Post-procedural neurological assessment was performed, showing return to baseline, prior to discharge. The patient was provided with post-procedure discharge instructions, including a section on how to identify potential problems. Should any problems arise concerning this procedure, the patient was given instructions to immediately contact us, at any time, without hesitation. In any case, we plan to contact the patient by telephone for a follow-up status report regarding this interventional procedure. Comments:  No additional relevant information.  Plan of Care   Imaging Orders  No imaging studies ordered today    Procedure Orders     Injection tendon or ligament  Medications ordered for procedure: Meds ordered this encounter  Medications  . lidocaine (XYLOCAINE) 2 % (with pres) injection 200 mg  . ropivacaine (PF) 2 mg/mL (0.2%) (NAROPIN) injection 9 mL  . methylPREDNISolone acetate (DEPO-MEDROL) injection 80 mg   Medications administered: We administered lidocaine, ropivacaine (PF) 2 mg/mL (0.2%), and methylPREDNISolone acetate.  See the medical record for exact dosing, route, and time of administration.  New Prescriptions   No medications on file   Disposition: Discharge home  Discharge Date & Time: 07/07/2017; 1014 hrs.   Physician-requested Follow-up: Return for post-procedure eval by Dr. Dossie Arbour in 2 wks. Future  Appointments Date Time Provider [place]  07/27/2017 8:15 AM Milinda Pointer, MD [facility] None  09/29/2017 8:45 AM Vevelyn Francois, NP [facility] None  11/17/2017 11:00 AM [facility] None  11/17/2017 11:15 AM Lequita Asal, MD [place] None   Primary Care Physician: Cletis Athens, MD Location: [facility] Note by: Gaspar Cola, MD Date: 07/07/2017; Time: 11:03 AM  Disclaimer:  Medicine is not an Chief Strategy Officer. The only guarantee in medicine is that nothing is guaranteed. It is important to note that the decision to proceed with this intervention was based on the information collected from the patient. The Data and conclusions were drawn from the patient's questionnaire, the interview, and the physical examination. Because the information was provided in large part by the patient, it cannot be guaranteed that it has not been purposely or unconsciously manipulated. Every effort has been made to obtain as much relevant data as possible for this evaluation. It is important to note that the conclusions that lead to this procedure are derived in large part from the available data. Always take into account that the treatment will also be dependent on availability of resources and existing treatment guidelines, considered by other Pain Management Practitioners as being common knowledge and practice, at the time of the intervention. For Medico-Legal purposes, it is also important to point out that variation in procedural techniques and pharmacological choices are the acceptable norm. The indications, contraindications, technique, and results of the above procedure should only be interpreted and judged by a Board-Certified Interventional Pain Specialist with extensive familiarity and expertise in the same exact procedure and technique.

## 2017-07-07 NOTE — Progress Notes (Signed)
Safety precautions to be maintained throughout the outpatient stay will include: orient to surroundings, keep bed in low position, maintain call bell within reach at all times, provide assistance with transfer out of bed and ambulation.  

## 2017-07-07 NOTE — Patient Instructions (Addendum)
____________________________________________________________________________________________  Post-Procedure instructions Instructions:  Apply ice: Fill a plastic sandwich bag with crushed ice. Cover it with a small towel and apply to injection site. Apply for 15 minutes then remove x 15 minutes. Repeat sequence on day of procedure, until you go to bed. The purpose is to minimize swelling and discomfort after procedure.  Apply heat: Apply heat to procedure site starting the day following the procedure. The purpose is to treat any soreness and discomfort from the procedure.  Food intake: Start with clear liquids (like water) and advance to regular food, as tolerated.   Physical activities: Keep activities to a minimum for the first 8 hours after the procedure.   Driving: If you have received any sedation, you are not allowed to drive for 24 hours after your procedure.  Blood thinner: Restart your blood thinner 6 hours after your procedure. (Only for those taking blood thinners)  Insulin: As soon as you can eat, you may resume your normal dosing schedule. (Only for those taking insulin)  Infection prevention: Keep procedure site clean and dry.  Post-procedure Pain Diary: Extremely important that this be done correctly and accurately. Recorded information will be used to determine the next step in treatment.  Pain evaluated is that of treated area only. Do not include pain from an untreated area.  Complete every hour, on the hour, for the initial 8 hours. Set an alarm to help you do this part accurately.  Do not go to sleep and have it completed later. It will not be accurate.  Follow-up appointment: Keep your follow-up appointment after the procedure. Usually 2 weeks for most procedures. (6 weeks in the case of radiofrequency.) Bring you pain diary.  Expect:  From numbing medicine (AKA: Local Anesthetics): Numbness or decrease in pain.  Onset: Full effect within 15 minutes of  injected.  Duration: It will depend on the type of local anesthetic used. On the average, 1 to 8 hours.   From steroids: Decrease in swelling or inflammation. Once inflammation is improved, relief of the pain will follow.  Onset of benefits: Depends on the amount of swelling present. The more swelling, the longer it will take for the benefits to be seen. In some cases, up to 10 days.  Duration: Steroids will stay in the system x 2 weeks. Duration of benefits will depend on multiple posibilities including persistent irritating factors.  From procedure: Some discomfort is to be expected once the numbing medicine wears off. This should be minimal if ice and heat are applied as instructed. Call if:  You experience numbness and weakness that gets worse with time, as opposed to wearing off.  New onset bowel or bladder incontinence. (Spinal procedures only)  Emergency Numbers:  Durning business hours (Monday - Thursday, 8:00 AM - 4:00 PM) (Friday, 9:00 AM - 12:00 Noon): [phone]  After hours: [phone] ____________________________________________________________________________________________   Post-procedure Information What to expect: Most procedures involve the use of a local anesthetic (numbing medicine), and a steroid (anti-inflammatory medicine).  The local anesthetics may cause temporary numbness and weakness of the legs or arms, depending on the location of the block. This numbness/weakness may last 4-6 hours, depending on the local anesthetic used. In rare instances, it can last up to 24 hours. While numb, you must be very careful not to injure the extremity.  After any procedure, you could expect the pain to get better within 15-20 minutes. This relief is temporary and may last 4-6 hours. Once the local anesthetics wears   off, you could experience discomfort, possibly more than usual, for up to 10 (ten) days. In the case of radiofrequencies, it may last up to 6 weeks.  Surgeries may take up to 8 weeks for the healing process. The discomfort is due to the irritation caused by needles going through skin and muscle. To minimize the discomfort, we recommend using ice the first day, and heat from then on. The ice should be applied for 15 minutes on, and 15 minutes off. Keep repeating this cycle until bedtime. Avoid applying the ice directly to the skin, to prevent frostbite. Heat should be used daily, until the pain improves (4-10 days). Be careful not to burn yourself.  Occasionally you may experience muscle spasms or cramps. These occur as a consequence of the irritation caused by the needle sticks to the muscle and the blood that will inevitably be lost into the surrounding muscle tissue. Blood tends to be very irritating to tissues, which tend to react by going into spasm. These spasms may start the same day of your procedure, but they may also take days to develop. This late onset type of spasm or cramp is usually caused by electrolyte imbalances triggered by the steroids, at the level of the kidney. Cramps and spasms tend to respond well to muscle relaxants, multivitamins (some are triggered by the procedure, but may have their origins in vitamin deficiencies), and "Gatorade", or any sports drinks that can replenish any electrolyte imbalances. (If you are a diabetic, ask your pharmacist to get you a sugar-free brand.) Warm showers or baths may also be helpful. Stretching exercises are highly recommended. General Instructions:  Be alert for signs of possible infection: redness, swelling, heat, red streaks, elevated temperature, and/or fever. These typically appear 4 to 6 days after the procedure. Immediately notify your doctor if you experience unusual bleeding, difficulty breathing, or loss of bowel or bladder control. If you experience increased pain, do not increase your pain medicine intake, unless instructed by your pain physician. Post-Procedure Care:  Be careful in  moving about. Muscle spasms in the area of the injection may occur. Applying ice or heat to the area is often helpful. The incidence of spinal headaches after epidural injections ranges between 1.4% and 6%. If you develop a headache that does not seem to respond to conservative therapy, please let your physician know. This can be treated with an epidural blood patch.   Post-procedure numbness or redness is to be expected, however it should average 4 to 6 hours. If numbness and weakness of your extremities begins to develop 4 to 6 hours after your procedure, and is felt to be progressing and worsening, immediately contact your physician.   Diet:  If you experience nausea, do not eat until this sensation goes away. If you had a "Stellate Ganglion Block" for upper extremity "Reflex Sympathetic Dystrophy", do not eat or drink until your hoarseness goes away. In any case, always start with liquids first and if you tolerate them well, then slowly progress to more solid foods. Activity:  For the first 4 to 6 hours after the procedure, use caution in moving about as you may experience numbness and/or weakness. Use caution in cooking, using household electrical appliances, and climbing steps. If you need to reach your Doctor call our office: [phone] Monday-Thursday 8:00 am - 4:00 PM    Fridays: Closed     In case of an emergency: In case of emergency, call 911 or go to the nearest emergency room and   have the physician there call us.  Interpretation of Procedure Every nerve block has two components: a diagnostic component, and a treatment component. Unrealistic expectations are the most common causes of "perceived failure".  In a perfect world, a single nerve block should be able to completely and permanently eliminate the pain. Sadly, the world is not perfect.  Most pain management nerve blocks are performed using local anesthetics and steroids. Steroids are responsible for any long-term benefit that  you may experience. Their purpose is to decrease any chronic swelling that may exist in the area. Steroids begin to work immediately after being injected. However, most patients will not experience any benefits until 5 to 10 days after the injection, when the swelling has come down to the point where they can tell a difference. Steroids will only help if there is swelling to be treated. As such, they can assist with the diagnosis. If effective, they suggest an inflammatory component to the pain, and if ineffective, they rule out inflammation as the main cause or component of the problem. If the problem is one of mechanical compression, you will get no benefit from those steroids.   In the case of local anesthetics, they have a crucial role in the diagnosis of your condition. Most will begin to work within15 to 20 minutes after injection. The duration will depend on the type used (short- vs. Long-acting). It is of outmost importance that patients keep tract of their pain, after the procedure. To assist with this matter, a "Post-procedure Pain Diary" is provided. Make sure to complete it and to bring it back to your follow-up appointment.  As long as the patient keeps accurate, detailed records of their symptoms after every procedure, and returns to have those interpreted, every procedure will provide us with invaluable information. Even a block that does not provide the patient with any relief, will always provide us with information about the mechanism and the origin of the pain. The only time a nerve block can be considered a waste of time is when patients do not keep track of the results, or do not keep their post-procedure appointment.  Reporting the results back to your physician The Pain Score  Pain is a subjective complaint. It cannot be seen, touched, or measured. We depend entirely on the patient's report of the pain in order to assess your condition and treatment. To evaluate the pain, we use a pain  scale, where "0" means "No Pain", and a "10" is "the worst possible pain that you can even imagine" (i.e. something like been eaten alive by a shark or being torn apart by a lion).   You will frequently be asked to rate your pain. Please be as accurate, remember that medical decisions will be based on your responses. Please do not rate your pain above a 10. Doing so is actually interpreted as "symptom magnification" (exaggeration), as well as lack of understanding with regards to the scale. To put this into perspective, when you tell us that your pain is at a 10 (ten), what you are saying is that there is nothing we can do to make this pain any worse. (Carefully think about that.) 

## 2017-07-08 ENCOUNTER — Telehealth: Payer: Self-pay

## 2017-07-08 NOTE — Telephone Encounter (Signed)
Post procedure phone call.  Patient states he is doing good.  

## 2017-07-27 ENCOUNTER — Ambulatory Visit: Payer: Self-pay | Admitting: Pain Medicine

## 2017-07-27 ENCOUNTER — Telehealth: Payer: Self-pay | Admitting: *Deleted

## 2017-07-27 NOTE — Telephone Encounter (Signed)
Left message for patient to notify them that it is time to schedule annual low dose lung cancer screening CT scan. Instructed patient to call back to verify information prior to the scan being scheduled.  

## 2017-08-04 ENCOUNTER — Telehealth: Payer: Self-pay | Admitting: *Deleted

## 2017-08-04 NOTE — Telephone Encounter (Signed)
Left message for patient to notify them that it is time to schedule low dose lung cancer screening CT scan follow up imaging. Instructed patient to call back to verify information prior to the scan being scheduled.

## 2017-09-06 DIAGNOSIS — C3492 Malignant neoplasm of unspecified part of left bronchus or lung: Secondary | ICD-10-CM

## 2017-09-06 HISTORY — DX: Malignant neoplasm of unspecified part of left bronchus or lung: C34.92

## 2017-09-19 ENCOUNTER — Telehealth: Payer: Self-pay | Admitting: *Deleted

## 2017-09-19 NOTE — Telephone Encounter (Signed)
Called patient "after Christmas" as requested by patient for consideration of 6 month follow up imaging from lung screening scan. Message left for patient to return my call for scheduling of Ct.

## 2017-09-29 ENCOUNTER — Other Ambulatory Visit: Payer: Self-pay

## 2017-09-29 ENCOUNTER — Ambulatory Visit: Payer: Medicare PPO | Attending: Nurse Practitioner | Admitting: Nurse Practitioner

## 2017-09-29 ENCOUNTER — Encounter: Payer: Self-pay | Admitting: Nurse Practitioner

## 2017-09-29 VITALS — BP 105/61 | HR 112 | Temp 98.1°F | Resp 16 | Ht 72.0 in | Wt 185.0 lb

## 2017-09-29 DIAGNOSIS — M5441 Lumbago with sciatica, right side: Secondary | ICD-10-CM

## 2017-09-29 DIAGNOSIS — M5442 Lumbago with sciatica, left side: Secondary | ICD-10-CM | POA: Diagnosis not present

## 2017-09-29 DIAGNOSIS — Z885 Allergy status to narcotic agent status: Secondary | ICD-10-CM | POA: Diagnosis not present

## 2017-09-29 DIAGNOSIS — M79605 Pain in left leg: Secondary | ICD-10-CM

## 2017-09-29 DIAGNOSIS — G4733 Obstructive sleep apnea (adult) (pediatric): Secondary | ICD-10-CM | POA: Diagnosis not present

## 2017-09-29 DIAGNOSIS — Z79891 Long term (current) use of opiate analgesic: Secondary | ICD-10-CM | POA: Diagnosis not present

## 2017-09-29 DIAGNOSIS — J449 Chronic obstructive pulmonary disease, unspecified: Secondary | ICD-10-CM | POA: Diagnosis not present

## 2017-09-29 DIAGNOSIS — K7460 Unspecified cirrhosis of liver: Secondary | ICD-10-CM | POA: Diagnosis not present

## 2017-09-29 DIAGNOSIS — G40909 Epilepsy, unspecified, not intractable, without status epilepticus: Secondary | ICD-10-CM | POA: Diagnosis not present

## 2017-09-29 DIAGNOSIS — K219 Gastro-esophageal reflux disease without esophagitis: Secondary | ICD-10-CM | POA: Diagnosis not present

## 2017-09-29 DIAGNOSIS — Z5181 Encounter for therapeutic drug level monitoring: Secondary | ICD-10-CM | POA: Diagnosis present

## 2017-09-29 DIAGNOSIS — G8929 Other chronic pain: Secondary | ICD-10-CM | POA: Diagnosis not present

## 2017-09-29 DIAGNOSIS — F17210 Nicotine dependence, cigarettes, uncomplicated: Secondary | ICD-10-CM | POA: Insufficient documentation

## 2017-09-29 DIAGNOSIS — M542 Cervicalgia: Secondary | ICD-10-CM | POA: Diagnosis not present

## 2017-09-29 DIAGNOSIS — M5416 Radiculopathy, lumbar region: Secondary | ICD-10-CM | POA: Diagnosis not present

## 2017-09-29 DIAGNOSIS — I10 Essential (primary) hypertension: Secondary | ICD-10-CM | POA: Diagnosis not present

## 2017-09-29 DIAGNOSIS — G894 Chronic pain syndrome: Secondary | ICD-10-CM

## 2017-09-29 DIAGNOSIS — M4802 Spinal stenosis, cervical region: Secondary | ICD-10-CM | POA: Diagnosis not present

## 2017-09-29 DIAGNOSIS — M48061 Spinal stenosis, lumbar region without neurogenic claudication: Secondary | ICD-10-CM | POA: Insufficient documentation

## 2017-09-29 DIAGNOSIS — M79604 Pain in right leg: Secondary | ICD-10-CM

## 2017-09-29 DIAGNOSIS — G5603 Carpal tunnel syndrome, bilateral upper limbs: Secondary | ICD-10-CM | POA: Diagnosis not present

## 2017-09-29 DIAGNOSIS — F419 Anxiety disorder, unspecified: Secondary | ICD-10-CM | POA: Diagnosis not present

## 2017-09-29 DIAGNOSIS — M109 Gout, unspecified: Secondary | ICD-10-CM | POA: Diagnosis not present

## 2017-09-29 DIAGNOSIS — M7918 Myalgia, other site: Secondary | ICD-10-CM | POA: Diagnosis not present

## 2017-09-29 DIAGNOSIS — F329 Major depressive disorder, single episode, unspecified: Secondary | ICD-10-CM | POA: Diagnosis not present

## 2017-09-29 MED ORDER — OXYCODONE HCL 5 MG PO TABS: 2.5000 mg | ORAL_TABLET | Freq: Two times a day (BID) | ORAL | 0 refills | Status: DC

## 2017-09-29 MED ORDER — BACLOFEN 10 MG PO TABS: 10.0000 mg | ORAL_TABLET | Freq: Three times a day (TID) | ORAL | 0 refills | Status: DC

## 2017-09-29 MED ORDER — GABAPENTIN 600 MG PO TABS: 600.0000 mg | ORAL_TABLET | Freq: Three times a day (TID) | ORAL | 0 refills | Status: DC

## 2017-09-29 NOTE — Progress Notes (Signed)
Patient's Name: John [name]  MRN: [id number]  Referring Provider: Cletis Athens, MD  DOB: 1956/08/05  PCP: Cletis Athens, MD  DOS: 09/29/2017  Note by: Vevelyn Francois NP  Service setting: Ambulatory outpatient  Specialty: Interventional Pain Management  Location: [facility] (AMB) Pain Management Facility    Patient type: Established    Primary Reason(s) for Visit: Encounter for prescription drug management. (Level of risk: moderate)  CC: Back Pain (lower)  HPI  John [name] is a 62 y.o. year old, male patient, who comes today for a medication management evaluation. He has Encounter for therapeutic drug level monitoring; Long term current use of opiate analgesic; Opiate use (7.5 MME/Day); Lumbar spondylosis; Chronic low back pain (Location of Primary Source of Pain) (Bilateral) (L>R); IVP/radiological dye Allergy; Thrombocytopenia ([place]); History of alcoholism; Carpal tunnel syndrome; Clinical depression; Difficulty in walking; Dystonia; Gastric ulcer; HCV (hepatitis C virus); H/O neoplasm; BP (high blood pressure); Decreased leukocytes; Hepatic cirrhosis ([place]); Loss of feeling or sensation; Absence of sensation; Current tobacco use; Has a tremor; Osteoarthritis of spine with radiculopathy, lumbosacral region; Peripheral neuropathy (Alcoholic); Chronic neck pain (Location of Tertiary source of pain) (Bilateral) (R>L); Cervical spondylosis; Obstructive sleep apnea; History of panic attacks; GERD (gastroesophageal reflux disease); Chronic constipation; 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[place]); Leukopenia; Lumbar facet syndrome (Location of Primary Source of Pain) (Bilateral) (L>R); Chronic lumbar radicular pain (Right S1 and Left L5) (Location of Secondary source of pain) (Bilateral) (L>R); Cervical radicular pain (Location of Tertiary source of pain) (Bilateral) (R>L); Chronic obstructive pulmonary disease (COPD) ([place]); Head injury; Epileptic disorder ([place]); Long term prescription opiate use; Encounter for chronic  pain management; Radicular pain of shoulder (Bilateral) (R>L); Opioid-induced constipation ([place]); Personal history of tobacco use, presenting hazards to health; Neurogenic pain; Musculoskeletal pain; Chronic lower extremity pain (Location of Secondary source of pain) (Bilateral) (L>R); Chronic upper extremity pain (Location of Tertiary source of pain) (Bilateral) (R>L); Abnormal MRI, lumbar spine (2015); Lumbar central spinal stenosis (Severe at L4-5; L5-S1); Lumbar lateral recess stenosis (Bilateral L>R at L4-5; Left sided at L5-S1); Lumbar foraminal stenosis (Left L5-S1); Lumbar facet hypertrophy (L4-5 & L5-S1 L>R); Abnormal MRI, cervical spine (2014); Cervical foraminal stenosis (Right sided C3-4 & C4-5) (Left-sided C5-6 and C7-T1) (Bilateral C6-7); Splenomegaly; Chronic pain syndrome; Chronic thumb pain (Left); Bilateral carpal tunnel syndrome; Foot pain, right; History of brain tumor; and De Quervain's tenosynovitis, left on their problem list. His primarily concern today is the Back Pain (lower)  Pain Assessment: Location: Lower, Medial Back Radiating: sometimes pain moves left and/or right, sometimes moves down back of both legs to feet Onset: More than a month ago Duration: Chronic pain Quality: Cramping, Constant, Shooting, Aching Severity: 4 /10 (self-reported pain score)  Note: Reported level is compatible with observation.                          Effect on ADL: unable to do as much Timing: Constant Modifying factors: medications  John [name] was last scheduled for an appointment on 06/30/2017 for medication management. During today's appointment we reviewed John [name] chronic pain status, as well as his outpatient medication regimen. He denies any concerns today. He admits that his pain is stable.  The patient  reports that he does not use drugs. His body mass index is 25.09 kg/m.  Further details on both, my assessment(s), as well as the proposed treatment plan, please see  below.  Controlled Substance Pharmacotherapy Assessment REMS (Risk Evaluation and Mitigation Strategy)  Analgesic:Oxycodone  2.5 one every 12 hours (5 mg/day of oxycodone) MME/day:7.5 mg/day   [name]  09/29/2017  8:55 AM  Signed Nursing Pain Medication Assessment:  Safety precautions to be maintained throughout the outpatient stay will include: orient to surroundings, keep bed in low position, maintain call bell within reach at all times, provide assistance with transfer out of bed and ambulation.  Medication Inspection Compliance: Pill count conducted under aseptic conditions, in front of the patient. Neither the pills nor the bottle was removed from the patient's sight at any time. Once count was completed pills were immediately returned to the patient in their original bottle.  Medication: Oxycodone HCL 5 mg Pill/Patch Count: 21.5 of 30 pills remain Pill/Patch Appearance: Markings consistent with prescribed medication Bottle Appearance: Standard pharmacy container. Clearly labeled. Filled Date: 01 / 16 / 2019 Last Medication intake:  Today   Pharmacokinetics: Liberation and absorption (onset of action): WNL Distribution (time to peak effect): WNL Metabolism and excretion (duration of action): WNL         Pharmacodynamics: Desired effects: Analgesia: John [name] reports >50% benefit. Functional ability: Patient reports that medication allows him to accomplish basic ADLs Clinically meaningful improvement in function (CMIF): Sustained CMIF goals met Perceived effectiveness: Described as relatively effective, allowing for increase in activities of daily living (ADL) Undesirable effects: Side-effects or Adverse reactions: None reported Monitoring: [place] PMP: Online review of the past 101-monthperiod conducted. Compliant with practice rules and regulations Last UDS on record: Summary  Date Value Ref Range Status  06/30/2017 FINAL  Final    Comment:     ==================================================================== TOXASSURE SELECT 13 (MW) ==================================================================== Test                             Result       Flag       Units Drug Present and Declared for Prescription Verification   Oxycodone                      216          EXPECTED   ng/mg creat   Oxymorphone                    113          EXPECTED   ng/mg creat   Noroxycodone                   504          EXPECTED   ng/mg creat    Sources of oxycodone include scheduled prescription medications.    Oxymorphone and noroxycodone are expected metabolites of    oxycodone. Oxymorphone is also available as a scheduled    prescription medication. Drug Absent but Declared for Prescription Verification   Alprazolam                     Not Detected UNEXPECTED ng/mg creat ==================================================================== Test                      Result    Flag   Units      Ref Range   Creatinine              45               mg/dL      >=20 ==================================================================== Declared Medications:  The flagging and interpretation on this report  are based on the  following declared medications.  Unexpected results may arise from  inaccuracies in the declared medications.  **Note: The testing scope of this panel includes these medications:  Alprazolam (Xanax)  Oxycodone (Roxicodone)  **Note: The testing scope of this panel does not include following  reported medications:  Baclofen  Cyanocobalamin  Folic acid (Folvite)  Gabapentin  Magnesium (Mag-Ox) ==================================================================== For clinical consultation, please call [phone]. ====================================================================    UDS interpretation: Compliant          Medication Assessment Form: Reviewed. Patient indicates being compliant with therapy Treatment  compliance: Compliant Risk Assessment Profile: Aberrant behavior: See prior evaluations. None observed or detected today Comorbid factors increasing risk of overdose: See prior notes. No additional risks detected today Risk of substance use disorder (SUD): Low Opioid Risk Tool - 09/29/17 0906      Family History of Substance Abuse   Alcohol  Positive Male      Personal History of Substance Abuse   Alcohol  Positive Male or Male    Illegal Drugs  Negative    Rx Drugs  Negative      Age   Age between 84-45 years   No      History of Preadolescent Sexual Abuse   History of Preadolescent Sexual Abuse  Negative or Male      Psychological Disease   Psychological Disease  Negative    Depression  Negative      Total Score   Opioid Risk Tool Scoring  6    Opioid Risk Interpretation  Moderate Risk      ORT Scoring interpretation table:  Score <3 = Low Risk for SUD  Score between 4-7 = Moderate Risk for SUD  Score >8 = High Risk for Opioid Abuse   Risk Mitigation Strategies:  Patient Counseling: Covered Patient-Prescriber Agreement (PPA): Present and active  Notification to other healthcare providers: Done  Pharmacologic Plan: No change in therapy, at this time.             Laboratory Chemistry  Inflammation Markers (CRP: Acute Phase) (ESR: Chronic Phase) Lab Results  Component Value Date   CRP 0.5 09/22/2015   ESRSEDRATE 15 09/22/2015                 Rheumatology Markers No results found for: RF, ANA, Therisa Doyne, [facility]              Renal Function Markers Lab Results  Component Value Date   BUN 11 05/19/2017   CREATININE 0.68 05/19/2017   GFRAA >60 05/19/2017   GFRNONAA >60 05/19/2017                 Hepatic Function Markers Lab Results  Component Value Date   AST 27 05/19/2017   ALT 17 05/19/2017   ALBUMIN 4.4 05/19/2017   ALKPHOS 58 05/19/2017   HCVAB <0.1 11/06/2015                 Electrolytes Lab Results  Component Value  Date   NA 137 05/19/2017   K 4.1 05/19/2017   CL 100 (L) 05/19/2017   CALCIUM 8.9 05/19/2017   MG 1.9 09/22/2015                 Neuropathy Markers Lab Results  Component Value Date   VITAMINB12 594 08/08/2015   FOLATE 68.8 08/08/2015                 Bone Pathology Markers No  results found for: VD25OH, H139778, [id number], [id number], 25OHVITD1, 25OHVITD2, 25OHVITD3, TESTOFREE, TESTOSTERONE               Coagulation Parameters Lab Results  Component Value Date   INR 1.02 05/19/2017   LABPROT 13.3 05/19/2017   APTT 33 08/08/2015   PLT 76 (L) 05/19/2017                 Cardiovascular Markers Lab Results  Component Value Date   CKTOTAL 45 04/17/2012   CKMB 0.8 04/17/2012   TROPONINI < 0.02 04/17/2012   HGB 13.2 05/19/2017   HCT 37.4 (L) 05/19/2017                 CA Markers No results found for: CEA, CA125, LABCA2               Note: Lab results reviewed.  Recent Diagnostic Imaging Results  US Abdomen Complete CLINICAL DATA:  Cirrhosis, evaluate for mass  EXAM: ABDOMEN ULTRASOUND COMPLETE  COMPARISON:  None.  FINDINGS: Gallbladder: Cholelithiasis without gallbladder wall thickening or pericholecystic fluid. Negative sonographic Murphy sign.  Common bile duct: Diameter: 5.2 mm  Liver: 1.6 x 1.2 x 1.4 cm anechoic right hepatic mass most consistent with a cyst. No solid hepatic mass. Coarse hepatic echotexture with a slightly nodular contour or as can be seen with cirrhosis. Portal vein is patent on color Doppler imaging with normal direction of blood flow towards the liver.  IVC: No abnormality visualized.  Pancreas: Visualized portion unremarkable.  Spleen: Spleen measures 15 cm in length with a volume of 755 mL.  Right Kidney: Length: 12.3 cm. Echogenicity within normal limits. No mass or hydronephrosis visualized.  Left Kidney: Length: 10.6 cm. Echogenicity within normal limits. No mass or hydronephrosis visualized.  Abdominal aorta:  Abdominal aortic ectasia measuring 2.7 cm.  Other findings: None.  IMPRESSION: 1. Cirrhotic liver without a focal hepatic mass. 2. Mild splenomegaly. 3. Abdominal aortic ectasia. Ectatic abdominal aorta, at risk for aneurysm development. Recommend follow-up aortic ultrasound in 5 years. This recommendation follows ACR consensus guidelines: White Paper of the ACR Incidental Findings Committee II on Vascular Findings. J Am Coll Radiol 2013; 10:789-794.  Electronically Signed   By: Kathreen Devoid   On: 05/25/2017 11:11  Complexity Note: Imaging results reviewed. Results shared with Mr. Harps, using Layman's terms.                         Meds   Current Outpatient Medications:  .  ALPRAZolam (XANAX) 0.25 MG tablet, Take 0.25 mg by mouth at bedtime as needed for anxiety., Disp: , Rfl:  .  baclofen (LIORESAL) 10 MG tablet, Take 1 tablet (10 mg total) by mouth 3 (three) times daily., Disp: 270 tablet, Rfl: 0 .  folic acid (FOLVITE) 1 MG tablet, Take 1 mg by mouth daily. , Disp: , Rfl:  .  gabapentin (NEURONTIN) 600 MG tablet, Take 1 tablet (600 mg total) by mouth 3 (three) times daily., Disp: 270 tablet, Rfl: 0 .  [START ON 12/20/2017] oxyCODONE (OXY IR/ROXICODONE) 5 MG immediate release tablet, Take 0.5 tablets (2.5 mg total) by mouth 2 (two) times daily., Disp: 30 tablet, Rfl: 0 .  Magnesium Oxide 500 MG CAPS, Take 1 capsule (500 mg total) by mouth 2 (two) times daily at 8 am and 10 pm., Disp: 180 capsule, Rfl: 0 .  [START ON 11/20/2017] oxyCODONE (OXY IR/ROXICODONE) 5 MG immediate release tablet, Take 0.5 tablets (2.5 mg total)  by mouth 2 (two) times daily., Disp: 30 tablet, Rfl: 0 .  [START ON 10/21/2017] oxyCODONE (OXY IR/ROXICODONE) 5 MG immediate release tablet, Take 0.5 tablets (2.5 mg total) by mouth 2 (two) times daily., Disp: 30 tablet, Rfl: 0  ROS  Constitutional: Denies any fever or chills Gastrointestinal: No reported hemesis, hematochezia, vomiting, or acute GI  distress Musculoskeletal: Denies any acute onset joint swelling, redness, loss of ROM, or weakness Neurological: No reported episodes of acute onset apraxia, aphasia, dysarthria, agnosia, amnesia, paralysis, loss of coordination, or loss of consciousness  Allergies  Mr. Downum is allergic to codeine sulfate; morphine; and duloxetine.  [place]  Drug: Mr. Mctigue  reports that he does not use drugs. Alcohol:  reports that he does not drink alcohol. Tobacco:  reports that he has been smoking cigarettes.  He has a 17.50 pack-year smoking history. he has never used smokeless tobacco. Medical:  has a past medical history of Anxiety, Back ache (05/06/2014), Cervical pain (05/06/2014), COPD (chronic obstructive pulmonary disease) ([place]), Depression, Dystonia, Extremity pain (05/06/2014), Febrile seizures ([place]), Foot pain (09/11/2014), Gout, Head injury (06/23/2015), Hemorrhoids, Hepatitis B, History of GI bleed, Hypertension, Leukopenia, Osteoarthritis of spine with radiculopathy, lumbar region (06/23/2015), Peripheral neuropathy (06/23/2015), Personal history of tobacco use, presenting hazards to health (01/09/2016), Pulse irregularity, Seizures ([place]), Spinal stenosis, and Uncomplicated opioid dependence ([place]) (06/23/2015). Surgical: Mr. Blackshire  has a past surgical history that includes surgery for cervical neck fracture; Esophagogastroduodenoscopy (11/2013); and Tooth Extraction (Right, 09/28/2016). Family: family history includes Cancer in his father; Cancer (age of onset: 75) in his brother; Heart disease in his father; Stroke in his father and mother.  Constitutional Exam  General appearance: Well nourished, well developed, and well hydrated. In no apparent acute distress Vitals:   09/29/17 0848  BP: 105/61  Pulse: (!) 112  Resp: 16  Temp: 98.1 F (36.7 C)  TempSrc: Oral  SpO2: 94%  Weight: 185 lb (83.9 kg)  Height: 6' (1.829 m)   BMI Assessment: Estimated body mass index is 25.09 kg/m as calculated  from the following:   Height as of this encounter: 6' (1.829 m).   Weight as of this encounter: 185 lb (83.9 kg). Psych/Mental status: Alert, oriented x 3 (person, place, & time)       Eyes: PERLA Respiratory: No evidence of acute respiratory distress  Cervical Spine Area Exam  Skin & Axial Inspection: No masses, redness, edema, swelling, or associated skin lesions Alignment: Symmetrical Functional ROM: Unrestricted ROM      Stability: No instability detected Muscle Tone/Strength: Functionally intact. No obvious neuro-muscular anomalies detected. Sensory (Neurological): Unimpaired Palpation: No palpable anomalies              Upper Extremity (UE) Exam    Side: Right upper extremity  Side: Left upper extremity  Skin & Extremity Inspection: Skin color, temperature, and hair growth are WNL. No peripheral edema or cyanosis. No masses, redness, swelling, asymmetry, or associated skin lesions. No contractures.  Skin & Extremity Inspection: Skin color, temperature, and hair growth are WNL. No peripheral edema or cyanosis. No masses, redness, swelling, asymmetry, or associated skin lesions. No contractures.  Functional ROM: Unrestricted ROM          Functional ROM: Unrestricted ROM          Muscle Tone/Strength: Functionally intact. No obvious neuro-muscular anomalies detected.  Muscle Tone/Strength: Functionally intact. No obvious neuro-muscular anomalies detected.  Sensory (Neurological): Unimpaired          Sensory (Neurological): Unimpaired  Palpation: No palpable anomalies              Palpation: No palpable anomalies              Specialized Test(s): Deferred         Specialized Test(s): Deferred          Thoracic Spine Area Exam  Skin & Axial Inspection: No masses, redness, or swelling Alignment: Symmetrical Functional ROM: Unrestricted ROM Stability: No instability detected Muscle Tone/Strength: Functionally intact. No obvious neuro-muscular anomalies detected. Sensory  (Neurological): Unimpaired Muscle strength & Tone: No palpable anomalies  Lumbar Spine Area Exam  Skin & Axial Inspection: No masses, redness, or swelling Alignment: Symmetrical Functional ROM: Unrestricted ROM      Stability: No instability detected Muscle Tone/Strength: Functionally intact. No obvious neuro-muscular anomalies detected. Sensory (Neurological): Unimpaired Palpation: No palpable anomalies       Provocative Tests: Lumbar Hyperextension and rotation test: evaluation deferred today       Lumbar Lateral bending test: evaluation deferred today       Patrick's Maneuver: evaluation deferred today                    Gait & Posture Assessment  Ambulation: Unassisted Gait: Relatively normal for age and body habitus Posture: WNL   Lower Extremity Exam    Side: Right lower extremity  Side: Left lower extremity  Skin & Extremity Inspection: Skin color, temperature, and hair growth are WNL. No peripheral edema or cyanosis. No masses, redness, swelling, asymmetry, or associated skin lesions. No contractures.  Skin & Extremity Inspection: Skin color, temperature, and hair growth are WNL. No peripheral edema or cyanosis. No masses, redness, swelling, asymmetry, or associated skin lesions. No contractures.  Functional ROM: Unrestricted ROM          Functional ROM: Unrestricted ROM          Muscle Tone/Strength: Functionally intact. No obvious neuro-muscular anomalies detected.  Muscle Tone/Strength: Functionally intact. No obvious neuro-muscular anomalies detected.  Sensory (Neurological): Unimpaired  Sensory (Neurological): Unimpaired  Palpation: No palpable anomalies  Palpation: No palpable anomalies   Assessment  Primary Diagnosis & Pertinent Problem List: The primary encounter diagnosis was Chronic neck pain (Location of Tertiary source of pain) (Bilateral) (R>L). Diagnoses of Chronic lumbar radicular pain (Right S1 and Left L5) (Location of Secondary source of pain) (Bilateral)  (L>R), Chronic lower extremity pain (Location of Secondary source of pain) (Bilateral) (L>R), Chronic low back pain (Location of Primary Source of Pain) (Bilateral) (L>R), Musculoskeletal pain, and Chronic pain syndrome were also pertinent to this visit.  Status Diagnosis  Controlled Controlled Controlled 1. Chronic neck pain (Location of Tertiary source of pain) (Bilateral) (R>L)   2. Chronic lumbar radicular pain (Right S1 and Left L5) (Location of Secondary source of pain) (Bilateral) (L>R)   3. Chronic lower extremity pain (Location of Secondary source of pain) (Bilateral) (L>R)   4. Chronic low back pain (Location of Primary Source of Pain) (Bilateral) (L>R)   5. Musculoskeletal pain   6. Chronic pain syndrome     Problems updated and reviewed during this visit: No problems updated. Plan of Care  Pharmacotherapy (Medications Ordered): Meds ordered this encounter  Medications  . oxyCODONE (OXY IR/ROXICODONE) 5 MG immediate release tablet    Sig: Take 0.5 tablets (2.5 mg total) by mouth 2 (two) times daily.    Dispense:  30 tablet    Refill:  0    Do not place this medication, or any  other prescription from our practice, on "Automatic Refill". Patient may have prescription filled one day early if pharmacy is closed on scheduled refill date. Do not fill until: 12/20/2017 To last until:01/19/2018    Order Specific Question:   Supervising Provider    Answer:   Milinda Pointer [phone]  . oxyCODONE (OXY IR/ROXICODONE) 5 MG immediate release tablet    Sig: Take 0.5 tablets (2.5 mg total) by mouth 2 (two) times daily.    Dispense:  30 tablet    Refill:  0    Do not place this medication, or any other prescription from our practice, on "Automatic Refill". Patient may have prescription filled one day early if pharmacy is closed on scheduled refill date. Do not fill until:11/20/2017 To last until: 12/20/2017    Order Specific Question:   Supervising Provider    Answer:   Milinda Pointer [phone]  . oxyCODONE (OXY IR/ROXICODONE) 5 MG immediate release tablet    Sig: Take 0.5 tablets (2.5 mg total) by mouth 2 (two) times daily.    Dispense:  30 tablet    Refill:  0    Do not place this medication, or any other prescription from our practice, on "Automatic Refill". Patient may have prescription filled one day early if pharmacy is closed on scheduled refill date. Do not fill until: 10/21/2017 To last until: 11/20/2017    Order Specific Question:   Supervising Provider    Answer:   Milinda Pointer [phone]  . gabapentin (NEURONTIN) 600 MG tablet    Sig: Take 1 tablet (600 mg total) by mouth 3 (three) times daily.    Dispense:  270 tablet    Refill:  0    Order Specific Question:   Supervising Provider    Answer:   Milinda Pointer [phone]  . baclofen (LIORESAL) 10 MG tablet    Sig: Take 1 tablet (10 mg total) by mouth 3 (three) times daily.    Dispense:  270 tablet    Refill:  0    Do not place this medication, or any other prescription from our practice, on "Automatic Refill". Patient may have prescription filled one day early if pharmacy is closed on scheduled refill date.    Order Specific Question:   Supervising Provider    Answer:   Milinda Pointer [[id number]]   New Prescriptions   No medications on file   Medications administered today: Aquil Duhe. Garabedian had no medications administered during this visit. Lab-work, procedure(s), and/or referral(s): No orders of the defined types were placed in this encounter.  Imaging and/or referral(s): None  Interventional therapies: Planned, scheduled, and/or pending:   Not at this time.   Considering:  Diagnostic left L5-S1 lumbar epidural steroid injection  Diagnostic bilateral L4-5 transforaminal epidural steroid injection  Diagnostic bilateral lumbar facet block  Diagnostic right-sided cervical epidural steroid injection     Palliative PRN treatment(s):  Not at this time.       Provider-requested follow-up: Return in about 3 months (around 12/28/2017) for MedMgmt with Me Donella Stade Edison Pace).  Future Appointments  Date Time Provider [place]  11/17/2017 11:00 AM [facility] None  11/17/2017 11:15 AM Lequita Asal, MD [facility] None  12/28/2017  9:30 AM Vevelyn Francois, NP [facility] None   Primary Care Physician: Cletis Athens, MD Location: [facility] Outpatient Pain Management Facility Note by: Vevelyn Francois NP Date: 09/29/2017; Time: 2:08 PM  Pain Score Disclaimer: We use the NRS-11 scale. This is a self-reported, subjective measurement of pain  severity with only modest accuracy. It is used primarily to identify changes within a particular patient. It must be understood that outpatient pain scales are significantly less accurate that those used for research, where they can be applied under ideal controlled circumstances with minimal exposure to variables. In reality, the score is likely to be a combination of pain intensity and pain affect, where pain affect describes the degree of emotional arousal or changes in action readiness caused by the sensory experience of pain. Factors such as social and work situation, setting, emotional state, anxiety levels, expectation, and prior pain experience may influence pain perception and show large inter-individual differences that may also be affected by time variables.  Patient instructions provided during this appointment: Patient Instructions   ____________________________________________________________________________________________  Medication Rules  Applies to: All patients receiving prescriptions (written or electronic).  Pharmacy of record: Pharmacy where electronic prescriptions will be sent. If written prescriptions are taken to a different pharmacy, please inform the nursing staff. The pharmacy listed in the electronic medical record should be the one where you would like electronic  prescriptions to be sent.  Prescription refills: Only during scheduled appointments. Applies to both, written and electronic prescriptions.  NOTE: The following applies primarily to controlled substances (Opioid* Pain Medications).   Patient's responsibilities: 1. Pain Pills: Bring all pain pills to every appointment (except for procedure appointments). 2. Pill Bottles: Bring pills in original pharmacy bottle. Always bring newest bottle. Bring bottle, even if empty. 3. Medication refills: You are responsible for knowing and keeping track of what medications you need refilled. The day before your appointment, write a list of all prescriptions that need to be refilled. Bring that list to your appointment and give it to the admitting nurse. Prescriptions will be written only during appointments. If you forget a medication, it will not be "Called in", "Faxed", or "electronically sent". You will need to get another appointment to get these prescribed. 4. Prescription Accuracy: You are responsible for carefully inspecting your prescriptions before leaving our office. Have the discharge nurse carefully go over each prescription with you, before taking them home. Make sure that your name is accurately spelled, that your address is correct. Check the name and dose of your medication to make sure it is accurate. Check the number of pills, and the written instructions to make sure they are clear and accurate. Make sure that you are given enough medication to last until your next medication refill appointment. 5. Taking Medication: Take medication as prescribed. Never take more pills than instructed. Never take medication more frequently than prescribed. Taking less pills or less frequently is permitted and encouraged, when it comes to controlled substances (written prescriptions).  6. Inform other Doctors: Always inform, all of your healthcare providers, of all the medications you take. 7. Pain Medication from  other Providers: You are not allowed to accept any additional pain medication from any other Doctor or Healthcare provider. There are two exceptions to this rule. (see below) In the event that you require additional pain medication, you are responsible for notifying us, as stated below. 8. Medication Agreement: You are responsible for carefully reading and following our Medication Agreement. This must be signed before receiving any prescriptions from our practice. Safely store a copy of your signed Agreement. Violations to the Agreement will result in no further prescriptions. (Additional copies of our Medication Agreement are available upon request.) 9. Laws, Rules, & Regulations: All patients are expected to follow all Federal and [employer], Rules, &  Regulations. Ignorance of the Laws does not constitute a valid excuse. The use of any illegal substances is prohibited. 10. Adopted CDC guidelines & recommendations: Target dosing levels will be at or below 60 MME/day. Use of benzodiazepines** is not recommended.  Exceptions: There are only two exceptions to the rule of not receiving pain medications from other Healthcare Providers. 1. Exception #1 (Emergencies): In the event of an emergency (i.e.: accident requiring emergency care), you are allowed to receive additional pain medication. However, you are responsible for: As soon as you are able, call our office [phone], at any time of the day or night, and leave a message stating your name, the date and nature of the emergency, and the name and dose of the medication prescribed. In the event that your call is answered by a member of our staff, make sure to document and save the date, time, and the name of the person that took your information.  2. Exception #2 (Planned Surgery): In the event that you are scheduled by another doctor or dentist to have any type of surgery or procedure, you are allowed (for a period no longer than 30 days), to  receive additional pain medication, for the acute post-op pain. However, in this case, you are responsible for picking up a copy of our "Post-op Pain Management for Surgeons" handout, and giving it to your surgeon or dentist. This document is available at our office, and does not require an appointment to obtain it. Simply go to our office during business hours (Monday-Thursday from 8:00 AM to 4:00 PM) (Friday 8:00 AM to 12:00 Noon) or if you have a scheduled appointment with [place], prior to your surgery, and ask for it by name. In addition, you will need to provide [place] with your name, name of your surgeon, type of surgery, and date of procedure or surgery.  *Opioid medications include: morphine, codeine, oxycodone, oxymorphone, hydrocodone, hydromorphone, meperidine, tramadol, tapentadol, buprenorphine, fentanyl, methadone. **Benzodiazepine medications include: diazepam (Valium), alprazolam (Xanax), clonazepam (Klonopine), lorazepam (Ativan), clorazepate (Tranxene), chlordiazepoxide (Librium), estazolam (Prosom), oxazepam (Serax), temazepam (Restoril), triazolam (Halcion)  ____________________________________________________________________________________________

## 2017-09-29 NOTE — Patient Instructions (Signed)

## 2017-09-29 NOTE — Progress Notes (Signed)
Nursing Pain Medication Assessment:  Safety precautions to be maintained throughout the outpatient stay will include: orient to surroundings, keep bed in low position, maintain call bell within reach at all times, provide assistance with transfer out of bed and ambulation.  Medication Inspection Compliance: Pill count conducted under aseptic conditions, in front of the patient. Neither the pills nor the bottle was removed from the patient's sight at any time. Once count was completed pills were immediately returned to the patient in their original bottle.  Medication: Oxycodone HCL 5 mg Pill/Patch Count: 21.5 of 30 pills remain Pill/Patch Appearance: Markings consistent with prescribed medication Bottle Appearance: Standard pharmacy container. Clearly labeled. Filled Date: 01 / 16 / 2019 Last Medication intake:  Today

## 2017-10-12 ENCOUNTER — Telehealth: Payer: Self-pay | Admitting: *Deleted

## 2017-10-12 DIAGNOSIS — R918 Other nonspecific abnormal finding of lung field: Secondary | ICD-10-CM

## 2017-10-12 DIAGNOSIS — Z87891 Personal history of nicotine dependence: Secondary | ICD-10-CM

## 2017-10-12 DIAGNOSIS — Z122 Encounter for screening for malignant neoplasm of respiratory organs: Secondary | ICD-10-CM

## 2017-10-12 NOTE — Telephone Encounter (Signed)
Notified patient that lung cancer screening low dose CT scan recommended follow up imaging is due currently or will be in near future. Confirmed that patient is within the age range of 55-77, and asymptomatic, (no signs or symptoms of lung cancer). Patient denies illness that would prevent curative treatment for lung cancer if found. Verified smoking history, (current, 45 pack year). The shared decision making visit was done 08/07/14. Patient is agreeable for CT scan being scheduled.

## 2017-11-01 ENCOUNTER — Ambulatory Visit
Admission: RE | Admit: 2017-11-01 | Discharge: 2017-11-01 | Disposition: A | Discharge status: Home / Self Care | Payer: Medicare PPO | Source: Ambulatory Visit | Attending: Nurse Practitioner | Admitting: Nurse Practitioner

## 2017-11-01 DIAGNOSIS — N62 Hypertrophy of breast: Secondary | ICD-10-CM | POA: Insufficient documentation

## 2017-11-01 DIAGNOSIS — J432 Centrilobular emphysema: Secondary | ICD-10-CM | POA: Diagnosis not present

## 2017-11-01 DIAGNOSIS — R918 Other nonspecific abnormal finding of lung field: Secondary | ICD-10-CM | POA: Diagnosis not present

## 2017-11-01 DIAGNOSIS — I2510 Atherosclerotic heart disease of native coronary artery without angina pectoris: Secondary | ICD-10-CM | POA: Diagnosis not present

## 2017-11-01 DIAGNOSIS — I700 Atherosclerosis of aorta: Secondary | ICD-10-CM | POA: Diagnosis not present

## 2017-11-01 DIAGNOSIS — K8020 Calculus of gallbladder without cholecystitis without obstruction: Secondary | ICD-10-CM | POA: Diagnosis not present

## 2017-11-01 DIAGNOSIS — Z87891 Personal history of nicotine dependence: Secondary | ICD-10-CM | POA: Insufficient documentation

## 2017-11-01 DIAGNOSIS — Z122 Encounter for screening for malignant neoplasm of respiratory organs: Secondary | ICD-10-CM | POA: Insufficient documentation

## 2017-11-01 DIAGNOSIS — K7460 Unspecified cirrhosis of liver: Secondary | ICD-10-CM | POA: Insufficient documentation

## 2017-11-07 ENCOUNTER — Other Ambulatory Visit: Payer: Self-pay | Admitting: Urgent Care

## 2017-11-07 ENCOUNTER — Telehealth: Payer: Self-pay | Admitting: *Deleted

## 2017-11-07 DIAGNOSIS — R911 Solitary pulmonary nodule: Secondary | ICD-10-CM

## 2017-11-07 NOTE — Telephone Encounter (Signed)
After discussion with Honor Loh NP, Contacted patient and reviewed lung screening results and plan for PET and PFT's to further evaluate to suspicious nodule. Patient verbalizes understanding.   IMPRESSION: 1. Lung-RADS 4B, suspicious. Interval growth of irregular solid apical left upper lobe pulmonary nodule measuring 9.0 mm in volume derived mean diameter, suspicious for primary bronchogenic carcinoma. Additional imaging evaluation such as with PET-CT and/or consultation with Pulmonology or Thoracic Surgery recommended. 2. Hepatic cirrhosis.  Mild gynecomastia. 3. One vessel coronary atherosclerosis. 4. Cholelithiasis.  Aortic Atherosclerosis (ICD10-I70.0) and Emphysema (ICD10-J43.9).

## 2017-11-07 NOTE — Telephone Encounter (Signed)
Per Gaspar Bidding 11/07/17 staff message to schedule PET scan, PFT and Refer to Dr. Genevive Bi.  Got Patient scheduled for PET scan on 11/11/17, PFT on 11/15/17 and RTC for lab/NP assessment on 11/17/17.   Patient stated that he did received a call from Dr. Genevive Bi office and told them that he felt that there wasn't any  need for him to see them before he had his PET scan done. Patient stated that he was going to have his PET scan first. I made him aware of date and time of his appts and also a letter will be mailed out.

## 2017-11-11 ENCOUNTER — Ambulatory Visit: Admission: RE | Admit: 2017-11-11 | Payer: Medicare PPO | Source: Ambulatory Visit

## 2017-11-15 ENCOUNTER — Ambulatory Visit: Payer: Medicare PPO | Attending: Urgent Care

## 2017-11-16 ENCOUNTER — Telehealth: Payer: Self-pay | Admitting: Hematology and Oncology

## 2017-11-17 ENCOUNTER — Ambulatory Visit: Payer: Self-pay | Admitting: Oncology

## 2017-11-17 ENCOUNTER — Other Ambulatory Visit: Payer: Self-pay

## 2017-11-18 ENCOUNTER — Ambulatory Visit: Payer: Self-pay | Admitting: Cardiothoracic Surgery

## 2017-11-21 ENCOUNTER — Ambulatory Visit: Payer: Self-pay | Admitting: Hematology and Oncology

## 2017-11-21 ENCOUNTER — Other Ambulatory Visit: Payer: Self-pay

## 2017-11-24 ENCOUNTER — Encounter
Admission: RE | Admit: 2017-11-24 | Discharge: 2017-11-24 | Disposition: A | Discharge status: Home / Self Care | Payer: Medicare PPO | Source: Ambulatory Visit | Attending: Urgent Care | Admitting: Urgent Care

## 2017-11-24 DIAGNOSIS — R911 Solitary pulmonary nodule: Secondary | ICD-10-CM | POA: Diagnosis not present

## 2017-11-24 LAB — GLUCOSE, CAPILLARY: Glucose-Capillary: 97 mg/dL (ref 65–99)

## 2017-11-24 MED ORDER — FLUDEOXYGLUCOSE F - 18 (FDG) INJECTION
9.5000 | Freq: Once | INTRAVENOUS | Status: AC | PRN
  Administered 2017-11-24: 9.5 via INTRAVENOUS

## 2017-11-25 ENCOUNTER — Telehealth: Payer: Self-pay | Admitting: *Deleted

## 2017-11-25 NOTE — Telephone Encounter (Signed)
Patient is scheduled Per John [name] 11/25/17 staff message,  RTC to review scans and plan care as requested. I tried to contact him to make him aware of his appt.  But, I was unable to reach him. Message was left of his vmail. Also, a letter will be mailed out.

## 2017-11-25 NOTE — Telephone Encounter (Signed)
Message left with patient regarding appt scheduled on Thurs 3/28 at 1pm for PFT's. Instructed that needs to arrive at the medical mall at 12:45pm. Informed to call back if has any questions or needs to change his appt. Nothing further needed at this time.

## 2017-11-25 NOTE — Telephone Encounter (Signed)
Discussed PET results with patient and plan of care to include PFT's and referral to see Dr. Genevive Bi. Patient is in agreement with plan. Appointments will be made for both.

## 2017-11-25 NOTE — Telephone Encounter (Signed)
Pt called back to cancel PFT's on 3/28 and states would like PFT's to be performed at [facility]. Marcie Bal informed in specialty scheduling to cancel appt on 3/28. Spoke with Caryl Pina at [facility] pulmonary and pt is scheduled for PFT's on 3/26 at 10:30am. Message left on pt's home number with appt information per pt request. Informed to call back if needs any further assistance.

## 2017-12-01 ENCOUNTER — Ambulatory Visit: Payer: Medicare PPO

## 2017-12-02 ENCOUNTER — Encounter: Payer: Self-pay | Admitting: Hematology and Oncology

## 2017-12-02 ENCOUNTER — Inpatient Hospital Stay: Payer: Medicare PPO | Attending: Hematology and Oncology | Admitting: Hematology and Oncology

## 2017-12-02 VITALS — BP 107/60 | HR 54 | Temp 96.6°F | Resp 18 | Wt 196.4 lb

## 2017-12-02 DIAGNOSIS — B1910 Unspecified viral hepatitis B without hepatic coma: Secondary | ICD-10-CM | POA: Diagnosis not present

## 2017-12-02 DIAGNOSIS — I10 Essential (primary) hypertension: Secondary | ICD-10-CM | POA: Insufficient documentation

## 2017-12-02 DIAGNOSIS — R911 Solitary pulmonary nodule: Secondary | ICD-10-CM

## 2017-12-02 DIAGNOSIS — R161 Splenomegaly, not elsewhere classified: Secondary | ICD-10-CM | POA: Insufficient documentation

## 2017-12-02 DIAGNOSIS — K7460 Unspecified cirrhosis of liver: Secondary | ICD-10-CM | POA: Insufficient documentation

## 2017-12-02 DIAGNOSIS — D696 Thrombocytopenia, unspecified: Secondary | ICD-10-CM

## 2017-12-02 DIAGNOSIS — Z79899 Other long term (current) drug therapy: Secondary | ICD-10-CM | POA: Insufficient documentation

## 2017-12-02 DIAGNOSIS — J449 Chronic obstructive pulmonary disease, unspecified: Secondary | ICD-10-CM | POA: Insufficient documentation

## 2017-12-02 DIAGNOSIS — F17210 Nicotine dependence, cigarettes, uncomplicated: Secondary | ICD-10-CM

## 2017-12-02 DIAGNOSIS — F419 Anxiety disorder, unspecified: Secondary | ICD-10-CM | POA: Diagnosis not present

## 2017-12-02 DIAGNOSIS — I77811 Abdominal aortic ectasia: Secondary | ICD-10-CM | POA: Insufficient documentation

## 2017-12-02 DIAGNOSIS — D6959 Other secondary thrombocytopenia: Secondary | ICD-10-CM | POA: Insufficient documentation

## 2017-12-02 DIAGNOSIS — Z7189 Other specified counseling: Secondary | ICD-10-CM

## 2017-12-02 HISTORY — DX: Solitary pulmonary nodule: R91.1
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
## 2017-12-02 NOTE — Progress Notes (Signed)
Pt in for follow up and scan results.

## 2017-12-03 DIAGNOSIS — Z7189 Other specified counseling: Secondary | ICD-10-CM | POA: Insufficient documentation

## 2017-12-09 ENCOUNTER — Encounter: Payer: Self-pay | Admitting: Cardiothoracic Surgery

## 2017-12-09 ENCOUNTER — Ambulatory Visit (INDEPENDENT_AMBULATORY_CARE_PROVIDER_SITE_OTHER): Payer: Medicare PPO | Admitting: Cardiothoracic Surgery

## 2017-12-09 VITALS — BP 118/61 | HR 99 | Temp 98.0°F | Resp 20 | Ht 72.0 in | Wt 199.6 lb

## 2017-12-09 DIAGNOSIS — R918 Other nonspecific abnormal finding of lung field: Secondary | ICD-10-CM | POA: Diagnosis not present

## 2017-12-09 NOTE — Progress Notes (Signed)
Patient ID: John [name], male   DOB: [birth year], 62 y.o.   MRN: [id number]  Chief Complaint  Patient presents with  . New Patient (Initial Visit)    Left Upper Lobe Nodule -referred Dr.Corcoran    Referred By Dr. Nolon Stalls Reason for Referral left upper lobe mass  HPI Location, Quality, Duration, Severity, Timing, Context, Modifying Factors, Associated Signs and Symptoms.  John [name] is a 62 y.o. male.  He has a multitude of medical problems including a history of hepatitis B with cirrhosis, extensive use of alcohol in the past as well as tobacco abuse.  He had a CT scan performed a year ago showing a small left upper lobe nodule and a repeat CT scan showed the nodule to have increased in size.  A subsequent PET scan was performed which also showed the left upper lobe lesion to be PET positive without evidence of distant disease.  There was cirrhosis present with splenomegaly and portal hypertension.  He has seen Dr. Mike Gip in the past for his thrombocytopenia.  He states that he quit drinking a couple years ago and quit smoking 1 month ago.  Prior to that time he smoked at least a pack cigarettes a day.  He states he is gained about 10 pounds over the last few months.  He attributes this to a good appetite.  He does very little activity and his overall pulmonary function tests were reviewed.  His FEV1 was 49% of predicted with a DLCO of 60% predicted these did improve slightly with bronchodilators.  There was no blood gas obtained.   Past Medical History:  Diagnosis Date  . Abnormal MRI, lumbar spine (2015) 01/21/2016   FINDINGS:  L3-4: Tiny right foraminal and extra foraminal disc bulge adjacent to but not compressing the L3 nerve lateral to the neural foramen. L4-5: There is a small broad-based disc bulge. There is also hypertrophy of the ligamentum flavum and facet joints, left more than right creating moderately severe spinal stenosis and bilateral lateral recess stenosis,  left greater than right. This appe  . Anxiety   . Back ache 05/06/2014  . BP (high blood pressure) 06/23/2015  . Cervical pain 05/06/2014  . 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[place]) 06/23/2015  . COPD (chronic obstructive pulmonary disease) ([place])   . De Quervain's tenosynovitis, left 07/07/2017  . Depression   . Dystonia   . Epileptic disorder ([place]) 06/23/2015  . Extremity pain 05/06/2014  . Febrile seizures ([place])    child  . Foot pain 09/11/2014  . Gastric ulcer 06/23/2015  . Gout   . H/O neoplasm 06/23/2015  . Head injury 06/23/2015  . Hemorrhoids   . Hepatic cirrhosis ([place]) 06/23/2015  . Hepatitis B   . History of brain tumor 07/07/2017  . History of GI bleed   . Hypertension   . Leukopenia   . Neurogenic pain 01/21/2016  . Nodule of upper lobe of left lung 12/02/2017  . Obstructive sleep apnea 06/23/2015  . 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 Osteoarthritis of spine with radiculopathy, lumbosacral region 06/23/2015  . Peripheral neuropathy 06/23/2015  . Peripheral neuropathy (Alcoholic) 74/25/9563  . Personal history of tobacco use, presenting hazards to health 01/09/2016  . Pulse irregularity   . Seizures ([place])   . Spinal stenosis   . Splenomegaly 05/30/2013  . Thrombocytopenia ([place]) 06/23/2015  . Uncomplicated opioid dependence ([place]) 06/23/2015    Past Surgical History:  Procedure Laterality Date  . ESOPHAGOGASTRODUODENOSCOPY  11/2013  .  surgery for cervical neck fracture    . TOOTH EXTRACTION Right 09/28/2016    Family History  Problem Relation Age of Onset  . Stroke Mother   . Cancer Father   . Heart disease Father   . Stroke Father   . Cancer Brother 43       Lung Cancer    Social History Social History   Tobacco Use  . Smoking status: Current Every Day Smoker    Packs/day: 0.50    Years: 35.00    Pack years: 17.50    Types: Cigarettes  . Smokeless tobacco: Never Used  Substance Use Topics  . Alcohol use: No     Alcohol/week: 0.0 oz    Comment: Pt states that he has been alcohol free since July 2013...  . Drug use: No    Allergies  Allergen Reactions  . Codeine Nausea Only  . Codeine Sulfate Nausea Only  . Morphine Nausea Only  . Duloxetine Rash    Made me sick    Current Outpatient Medications  Medication Sig Dispense Refill  . ALPRAZolam (XANAX) 0.25 MG tablet Take 0.25 mg by mouth at bedtime as needed for anxiety.    . baclofen (LIORESAL) 10 MG tablet Take 1 tablet (10 mg total) by mouth 3 (three) times daily. 270 tablet 0  . cetirizine (ZYRTEC) 10 MG tablet Take 10 mg by mouth daily.  3  . folic acid (FOLVITE) 1 MG tablet Take 1 mg by mouth daily.     Marland Kitchen gabapentin (NEURONTIN) 600 MG tablet Take 1 tablet (600 mg total) by mouth 3 (three) times daily. 270 tablet 0  . [START ON 12/20/2017] oxyCODONE (OXY IR/ROXICODONE) 5 MG immediate release tablet Take 0.5 tablets (2.5 mg total) by mouth 2 (two) times daily. 30 tablet 0  . Magnesium Oxide 500 MG CAPS Take 1 capsule (500 mg total) by mouth 2 (two) times daily at 8 am and 10 pm. 180 capsule 0   No current facility-administered medications for this visit.       Review of Systems A complete review of systems was asked and was negative except for the following positive findings chest pain, shortness of breath, wheezing, heartburn, blood in his stools, joint pain, excessive thirst, easy bruising.  Blood pressure 118/61, pulse 99, temperature 98 F (36.7 C), temperature source Oral, resp. rate 20, height 6' (1.829 m), weight 199 lb 9.6 oz (90.5 kg), SpO2 97 %.  Physical Exam CONSTITUTIONAL:  Pleasant, well-developed, well-nourished, and in no acute distress.  He did have an essential tremor. EYES: Pupils equal and reactive to light, Sclera non-icteric EARS, NOSE, MOUTH AND THROAT:  The oropharynx was clear.  Dentition is poor repair.  Oral mucosa pink and moist. LYMPH NODES:  Lymph nodes in the neck and axillae were normal RESPIRATORY:   Lungs were clear but distant in both upper lobe areas.  Normal respiratory effort without pathologic use of accessory muscles of respiration CARDIOVASCULAR: Heart was irregularly irregular without murmurs.  There were no carotid bruits. GI: The abdomen was soft, nontender, and nondistended. There were no palpable masses. There was no hepatosplenomegaly. There were normal bowel sounds in all quadrants. GU:  Rectal deferred.   MUSCULOSKELETAL:  Normal muscle strength and tone.  No clubbing or cyanosis.   SKIN:  There were no pathologic skin lesions.  There were no nodules on palpation.  Spider telangiectasias present NEUROLOGIC:  Sensation is normal.  Cranial nerves are grossly intact. PSYCH:  Oriented to person, place and  time.  Mood and affect are normal.  Data Reviewed CT scans and PET scans  I have personally reviewed the patient's imaging, laboratory findings and medical records.    Assessment    I believe that the left upper lobe lesion is most likely a malignancy.  It is of relatively slow growth.    Plan    I had a long discussion with him regarding the options at this point.  His surgical risks are exacerbated by his poor pulmonary functions, cirrhosis and thrombocytopenia.  I also reviewed with him the options of SBRT for his left upper lobe lesion.  He would like to talk to Dr. Donella Stade to consider his options for radiation therapy.  Once he seen Dr. Donella Stade we can then make further recommendations.       Nestor Lewandowsky, MD 12/09/2017, 10:54 AM

## 2017-12-09 NOTE — Patient Instructions (Addendum)
We will send the referral to Dr.[facility] and someone from their office will contact you to schedule an appointment. If you do not hear from anyone within 5-7 days please call our office and let us know.  Please call Dr.Oaks and let him know what you decide after your appointment with Dr.Crystal.

## 2017-12-13 ENCOUNTER — Other Ambulatory Visit: Payer: Self-pay | Admitting: Urgent Care

## 2017-12-13 DIAGNOSIS — R911 Solitary pulmonary nodule: Secondary | ICD-10-CM

## 2017-12-14 ENCOUNTER — Telehealth: Payer: Self-pay | Admitting: *Deleted

## 2017-12-14 NOTE — Telephone Encounter (Signed)
Called pt to give appt details for radiation oncology consult with Dr. Baruch Gouty on Mon 4/22 at 10:30am at the [facility]. Pt confirmed appt. Informed that Dr. Mike Gip would also like to follow up with patient and can make appt for him while on the phone. Pt stated that he did not need to see Dr. Mike Gip at this time and only wanted to see Dr. Baruch Gouty to discuss radiation. Pt declined to make appt with Dr. Mike Gip. appt for Dr. Baruch Gouty has mailed per pt request.

## 2017-12-26 ENCOUNTER — Other Ambulatory Visit: Payer: Self-pay

## 2017-12-26 ENCOUNTER — Encounter: Payer: Self-pay | Admitting: Radiation Oncology

## 2017-12-26 ENCOUNTER — Ambulatory Visit
Admission: RE | Admit: 2017-12-26 | Discharge: 2017-12-26 | Disposition: A | Discharge status: Home / Self Care | Payer: Medicare PPO | Source: Ambulatory Visit | Attending: Radiation Oncology | Admitting: Radiation Oncology

## 2017-12-26 VITALS — BP 102/56 | HR 51 | Temp 97.9°F | Resp 12 | Ht 72.0 in | Wt 198.9 lb

## 2017-12-26 DIAGNOSIS — R161 Splenomegaly, not elsewhere classified: Secondary | ICD-10-CM | POA: Diagnosis not present

## 2017-12-26 DIAGNOSIS — Z800 Family history of malignant neoplasm of digestive organs: Secondary | ICD-10-CM | POA: Insufficient documentation

## 2017-12-26 DIAGNOSIS — Z79899 Other long term (current) drug therapy: Secondary | ICD-10-CM | POA: Insufficient documentation

## 2017-12-26 DIAGNOSIS — M4800 Spinal stenosis, site unspecified: Secondary | ICD-10-CM | POA: Diagnosis not present

## 2017-12-26 DIAGNOSIS — B1910 Unspecified viral hepatitis B without hepatic coma: Secondary | ICD-10-CM | POA: Insufficient documentation

## 2017-12-26 DIAGNOSIS — M1990 Unspecified osteoarthritis, unspecified site: Secondary | ICD-10-CM | POA: Diagnosis not present

## 2017-12-26 DIAGNOSIS — F17210 Nicotine dependence, cigarettes, uncomplicated: Secondary | ICD-10-CM | POA: Diagnosis not present

## 2017-12-26 DIAGNOSIS — G40909 Epilepsy, unspecified, not intractable, without status epilepticus: Secondary | ICD-10-CM | POA: Diagnosis not present

## 2017-12-26 DIAGNOSIS — R918 Other nonspecific abnormal finding of lung field: Secondary | ICD-10-CM

## 2017-12-26 DIAGNOSIS — K7460 Unspecified cirrhosis of liver: Secondary | ICD-10-CM | POA: Insufficient documentation

## 2017-12-26 DIAGNOSIS — F419 Anxiety disorder, unspecified: Secondary | ICD-10-CM | POA: Insufficient documentation

## 2017-12-26 DIAGNOSIS — F1120 Opioid dependence, uncomplicated: Secondary | ICD-10-CM | POA: Insufficient documentation

## 2017-12-26 DIAGNOSIS — J449 Chronic obstructive pulmonary disease, unspecified: Secondary | ICD-10-CM | POA: Diagnosis not present

## 2017-12-26 DIAGNOSIS — Z8711 Personal history of peptic ulcer disease: Secondary | ICD-10-CM | POA: Insufficient documentation

## 2017-12-26 DIAGNOSIS — D72829 Elevated white blood cell count, unspecified: Secondary | ICD-10-CM | POA: Insufficient documentation

## 2017-12-26 DIAGNOSIS — C3412 Malignant neoplasm of upper lobe, left bronchus or lung: Secondary | ICD-10-CM | POA: Insufficient documentation

## 2017-12-26 DIAGNOSIS — M542 Cervicalgia: Secondary | ICD-10-CM | POA: Insufficient documentation

## 2017-12-26 DIAGNOSIS — G4730 Sleep apnea, unspecified: Secondary | ICD-10-CM | POA: Insufficient documentation

## 2017-12-26 DIAGNOSIS — D696 Thrombocytopenia, unspecified: Secondary | ICD-10-CM | POA: Insufficient documentation

## 2017-12-26 DIAGNOSIS — I10 Essential (primary) hypertension: Secondary | ICD-10-CM | POA: Insufficient documentation

## 2017-12-26 DIAGNOSIS — G629 Polyneuropathy, unspecified: Secondary | ICD-10-CM | POA: Diagnosis not present

## 2017-12-26 DIAGNOSIS — K860 Alcohol-induced chronic pancreatitis: Secondary | ICD-10-CM | POA: Diagnosis not present

## 2017-12-26 NOTE — Progress Notes (Signed)
Patient here for initial visit. °

## 2017-12-26 NOTE — Consult Note (Signed)
NEW PATIENT EVALUATION  Name: John [name]  MRN: [id number]  Date:   12/26/2017     DOB: 07/17/1956   This 62 y.o. 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  REFERRING PHYSICIAN: Cletis Athens, MD  CHIEF COMPLAINT:  Chief Complaint  Patient presents with  . Lung Cancer    DIAGNOSIS: The encounter diagnosis was Mass of upper lobe of left lung.   PREVIOUS INVESTIGATIONS:  CT scan PET CT scan reviewed Clinical notes reviewed  HPI: patient is a 62 year old male with multiple medical comorbidities including hepatitis B with cirrhosis substance abuse who is been followed on repeat CT scans having increasing nodule in his left upper lobe. PET CT scan was performed showing again hypermetabolic activity consistent with malignancy in the left upper lobe. There was no evidence of mediastinal or distant disease by PET CT criteria. He is being followed by Dr. Mike Gip for thrombocytopenia. He also has an FEV1 of 49% of predicted. He has been seen by Dr. Faith Rogue who has turned down for surgery based on his overall morbidities as well as poor pulmonary function tests. He is seen today by radiation oncology for consideration of SB RT. He specifically denies cough hemoptysis or chest tightness.  PLANNED TREATMENT REGIMEN: SB RT  PAST MEDICAL HISTORY:  has a past medical history of Abnormal MRI, lumbar spine (2015) (01/21/2016), Anxiety, Back ache (05/06/2014), BP (high blood pressure) (06/23/2015), Cervical pain (05/06/2014), 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[place]) (06/23/2015), COPD (chronic obstructive pulmonary disease) ([place]), De Quervain's tenosynovitis, left (07/07/2017), Depression, Dystonia, Epileptic disorder ([place]) (06/23/2015), Extremity pain (05/06/2014), Febrile seizures ([place]), Foot pain (09/11/2014), Gastric ulcer (06/23/2015), Gout, H/O neoplasm (06/23/2015), Head injury (06/23/2015), Hemorrhoids, Hepatic cirrhosis ([place])  (06/23/2015), Hepatitis B, History of brain tumor (07/07/2017), History of GI bleed, Hypertension, Leukopenia, Neurogenic pain (01/21/2016), Nodule of upper lobe of left lung (12/02/2017), Obstructive sleep apnea (06/23/2015), Osteoarthritis of spine with radiculopathy, lumbar region (06/23/2015), Osteoarthritis of spine with radiculopathy, lumbosacral region (06/23/2015), Peripheral neuropathy (06/23/2015), Peripheral neuropathy (Alcoholic) (58/52/7782), Personal history of tobacco use, presenting hazards to health (01/09/2016), Pulse irregularity, Seizures ([place]), Spinal stenosis, Splenomegaly (05/30/2013), Thrombocytopenia ([place]) (42/35/3614), and Uncomplicated opioid dependence ([place]) (06/23/2015).    PAST SURGICAL HISTORY:  Past Surgical History:  Procedure Laterality Date  . ESOPHAGOGASTRODUODENOSCOPY  11/2013  . surgery for cervical neck fracture    . TOOTH EXTRACTION Right 09/28/2016    FAMILY HISTORY: family history includes Cancer in his father; Cancer (age of onset: 59) in his brother; Heart disease in his father; Stroke in his father and mother.  SOCIAL HISTORY:  reports that he has been smoking cigarettes.  He has a 17.50 pack-year smoking history. He has never used smokeless tobacco. He reports that he does not drink alcohol or use drugs.  ALLERGIES: Codeine; Codeine sulfate; Morphine; and Duloxetine  MEDICATIONS:  Current Outpatient Medications  Medication Sig Dispense Refill  . ALPRAZolam (XANAX) 0.25 MG tablet Take 0.25 mg by mouth at bedtime as needed for anxiety.    . baclofen (LIORESAL) 10 MG tablet Take 1 tablet (10 mg total) by mouth 3 (three) times daily. 270 tablet 0  . cetirizine (ZYRTEC) 10 MG tablet Take 10 mg by mouth daily.  3  . folic acid (FOLVITE) 1 MG tablet Take 1 mg by mouth daily.     Marland Kitchen gabapentin (NEURONTIN) 600 MG tablet Take 1 tablet (600 mg total) by mouth 3 (three) times daily. 270 tablet 0  .  Magnesium Oxide 500 MG CAPS Take 1 capsule (500 mg total) by mouth 2  (two) times daily at 8 am and 10 pm. 180 capsule 0  . oxyCODONE (OXY IR/ROXICODONE) 5 MG immediate release tablet Take 0.5 tablets (2.5 mg total) by mouth 2 (two) times daily. 30 tablet 0   No current facility-administered medications for this encounter.     ECOG PERFORMANCE STATUS:  0 - Asymptomatic  REVIEW OF SYSTEMS:  Patient denies any weight loss, fatigue, weakness, fever, chills or night sweats. Patient denies any loss of vision, blurred vision. Patient denies any ringing  of the ears or hearing loss. No irregular heartbeat. Patient denies heart murmur or history of fainting. Patient denies any chest pain or pain radiating to her upper extremities. Patient denies any shortness of breath, difficulty breathing at night, cough or hemoptysis. Patient denies any swelling in the lower legs. Patient denies any nausea vomiting, vomiting of blood, or coffee ground material in the vomitus. Patient denies any stomach pain. Patient states has had normal bowel movements no significant constipation or diarrhea. Patient denies any dysuria, hematuria or significant nocturia. Patient denies any problems walking, swelling in the joints or loss of balance. Patient denies any skin changes, loss of hair or loss of weight. Patient denies any excessive worrying or anxiety or significant depression. Patient denies any problems with insomnia. Patient denies excessive thirst, polyuria, polydipsia. Patient denies any swollen glands, patient denies easy bruising or easy bleeding. Patient denies any recent infections, allergies or URI. Patient "s visual fields have not changed significantly in recent time.    PHYSICAL EXAM: BP (!) 102/56 (BP Location: Left Arm, Patient Position: Sitting)   Pulse (!) 51   Temp 97.9 F (36.6 C) (Tympanic)   Resp 12   Ht 6' (1.829 m)   Wt 198 lb 13.7 oz (90.2 kg)   BMI 26.97 kg/m  Well-developed well-nourished patient in NAD. HEENT reveals PERLA, EOMI, discs not visualized.  Oral cavity  is clear. No oral mucosal lesions are identified. Neck is clear without evidence of cervical or supraclavicular adenopathy. Lungs are clear to A&P. Cardiac examination is essentially unremarkable with regular rate and rhythm without murmur rub or thrill. Abdomen is benign with no organomegaly or masses noted. Motor sensory and DTR levels are equal and symmetric in the upper and lower extremities. Cranial nerves II through XII are grossly intact. Proprioception is intact. No peripheral adenopathy or edema is identified. No motor or sensory levels are noted. Crude visual fields are within normal range.  LABORATORY DATA: no current pathology reports review    RADIOLOGY RESULTS:PET CT and CT scans reviewed   IMPRESSION: stage I non-small cell lung cancer left upper lobe in 62 year old male with multiple comorbidities precluding surgery including cirrhosis thrombocytopenia and poor pulmonary functionsfor SB RT  PLAN: this time I to go ahead with SB RT to her left lupper lobe lesion. Would plan on delivering 6000 cGy in 5 fractions. Would use restricted pulmonary function protocol for treatment planning as well as 4D study. Risks and benefits of treatment including possible fatigue possible slight chance of radiation esophagitis development of cough skin reaction all were discussed in detail. Patient comprehend her treatment plan well. I personally set up and ordered CT simulation in about a week's time. Patient seems to compress my treatment plan well.  I would like to take this opportunity to thank you for allowing me to participate in the care of your patient.Noreene Filbert, MD

## 2017-12-28 ENCOUNTER — Encounter: Payer: Self-pay | Admitting: Nurse Practitioner

## 2017-12-28 ENCOUNTER — Ambulatory Visit: Payer: Medicare PPO | Attending: Nurse Practitioner | Admitting: Nurse Practitioner

## 2017-12-28 VITALS — BP 129/66 | HR 99 | Temp 98.2°F | Resp 16 | Ht 72.0 in | Wt 192.0 lb

## 2017-12-28 DIAGNOSIS — Z8719 Personal history of other diseases of the digestive system: Secondary | ICD-10-CM | POA: Insufficient documentation

## 2017-12-28 DIAGNOSIS — M4802 Spinal stenosis, cervical region: Secondary | ICD-10-CM | POA: Diagnosis not present

## 2017-12-28 DIAGNOSIS — F17210 Nicotine dependence, cigarettes, uncomplicated: Secondary | ICD-10-CM | POA: Insufficient documentation

## 2017-12-28 DIAGNOSIS — J449 Chronic obstructive pulmonary disease, unspecified: Secondary | ICD-10-CM | POA: Insufficient documentation

## 2017-12-28 DIAGNOSIS — K8020 Calculus of gallbladder without cholecystitis without obstruction: Secondary | ICD-10-CM | POA: Diagnosis not present

## 2017-12-28 DIAGNOSIS — R911 Solitary pulmonary nodule: Secondary | ICD-10-CM | POA: Diagnosis not present

## 2017-12-28 DIAGNOSIS — I700 Atherosclerosis of aorta: Secondary | ICD-10-CM | POA: Insufficient documentation

## 2017-12-28 DIAGNOSIS — M654 Radial styloid tenosynovitis [de Quervain]: Secondary | ICD-10-CM | POA: Insufficient documentation

## 2017-12-28 DIAGNOSIS — Z79891 Long term (current) use of opiate analgesic: Secondary | ICD-10-CM | POA: Diagnosis not present

## 2017-12-28 DIAGNOSIS — R161 Splenomegaly, not elsewhere classified: Secondary | ICD-10-CM | POA: Diagnosis not present

## 2017-12-28 DIAGNOSIS — M4722 Other spondylosis with radiculopathy, cervical region: Secondary | ICD-10-CM | POA: Insufficient documentation

## 2017-12-28 DIAGNOSIS — M48061 Spinal stenosis, lumbar region without neurogenic claudication: Secondary | ICD-10-CM | POA: Insufficient documentation

## 2017-12-28 DIAGNOSIS — M4727 Other spondylosis with radiculopathy, lumbosacral region: Secondary | ICD-10-CM | POA: Insufficient documentation

## 2017-12-28 DIAGNOSIS — M47812 Spondylosis without myelopathy or radiculopathy, cervical region: Secondary | ICD-10-CM

## 2017-12-28 DIAGNOSIS — I2510 Atherosclerotic heart disease of native coronary artery without angina pectoris: Secondary | ICD-10-CM | POA: Insufficient documentation

## 2017-12-28 DIAGNOSIS — G4733 Obstructive sleep apnea (adult) (pediatric): Secondary | ICD-10-CM | POA: Diagnosis not present

## 2017-12-28 DIAGNOSIS — F329 Major depressive disorder, single episode, unspecified: Secondary | ICD-10-CM | POA: Diagnosis not present

## 2017-12-28 DIAGNOSIS — D696 Thrombocytopenia, unspecified: Secondary | ICD-10-CM | POA: Diagnosis not present

## 2017-12-28 DIAGNOSIS — Z79899 Other long term (current) drug therapy: Secondary | ICD-10-CM | POA: Diagnosis not present

## 2017-12-28 DIAGNOSIS — G5603 Carpal tunnel syndrome, bilateral upper limbs: Secondary | ICD-10-CM | POA: Insufficient documentation

## 2017-12-28 DIAGNOSIS — K860 Alcohol-induced chronic pancreatitis: Secondary | ICD-10-CM | POA: Diagnosis not present

## 2017-12-28 DIAGNOSIS — M47816 Spondylosis without myelopathy or radiculopathy, lumbar region: Secondary | ICD-10-CM | POA: Diagnosis not present

## 2017-12-28 DIAGNOSIS — G40909 Epilepsy, unspecified, not intractable, without status epilepticus: Secondary | ICD-10-CM | POA: Diagnosis not present

## 2017-12-28 DIAGNOSIS — K219 Gastro-esophageal reflux disease without esophagitis: Secondary | ICD-10-CM | POA: Diagnosis not present

## 2017-12-28 DIAGNOSIS — M7918 Myalgia, other site: Secondary | ICD-10-CM

## 2017-12-28 DIAGNOSIS — J320 Chronic maxillary sinusitis: Secondary | ICD-10-CM | POA: Diagnosis not present

## 2017-12-28 DIAGNOSIS — F1021 Alcohol dependence, in remission: Secondary | ICD-10-CM | POA: Insufficient documentation

## 2017-12-28 DIAGNOSIS — K5909 Other constipation: Secondary | ICD-10-CM | POA: Diagnosis not present

## 2017-12-28 DIAGNOSIS — K7460 Unspecified cirrhosis of liver: Secondary | ICD-10-CM | POA: Insufficient documentation

## 2017-12-28 DIAGNOSIS — G894 Chronic pain syndrome: Secondary | ICD-10-CM | POA: Diagnosis not present

## 2017-12-28 DIAGNOSIS — B1920 Unspecified viral hepatitis C without hepatic coma: Secondary | ICD-10-CM | POA: Diagnosis not present

## 2017-12-28 DIAGNOSIS — M4726 Other spondylosis with radiculopathy, lumbar region: Secondary | ICD-10-CM | POA: Insufficient documentation

## 2017-12-28 DIAGNOSIS — M545 Low back pain: Secondary | ICD-10-CM | POA: Diagnosis present

## 2017-12-28 MED ORDER — OXYCODONE HCL 5 MG PO TABS: 2.5000 mg | ORAL_TABLET | Freq: Two times a day (BID) | ORAL | 0 refills | Status: DC

## 2017-12-28 MED ORDER — GABAPENTIN 600 MG PO TABS: 600.0000 mg | ORAL_TABLET | Freq: Three times a day (TID) | ORAL | 0 refills | Status: DC

## 2017-12-28 MED ORDER — BACLOFEN 10 MG PO TABS: 10.0000 mg | ORAL_TABLET | Freq: Three times a day (TID) | ORAL | 0 refills | Status: DC

## 2017-12-28 NOTE — Patient Instructions (Signed)
____________________________________________________________________________________________  Medication Rules  Applies to: All patients receiving prescriptions (written or electronic).  Pharmacy of record: Pharmacy where electronic prescriptions will be sent. If written prescriptions are taken to a different pharmacy, please inform the nursing staff. The pharmacy listed in the electronic medical record should be the one where you would like electronic prescriptions to be sent.  Prescription refills: Only during scheduled appointments. Applies to both, written and electronic prescriptions.  NOTE: The following applies primarily to controlled substances (Opioid* Pain Medications).   Patient's responsibilities: 1. Pain Pills: Bring all pain pills to every appointment (except for procedure appointments). 2. Pill Bottles: Bring pills in original pharmacy bottle. Always bring newest bottle. Bring bottle, even if empty. 3. Medication refills: You are responsible for knowing and keeping track of what medications you need refilled. The day before your appointment, write a list of all prescriptions that need to be refilled. Bring that list to your appointment and give it to the admitting nurse. Prescriptions will be written only during appointments. If you forget a medication, it will not be "Called in", "Faxed", or "electronically sent". You will need to get another appointment to get these prescribed. 4. Prescription Accuracy: You are responsible for carefully inspecting your prescriptions before leaving our office. Have the discharge nurse carefully go over each prescription with you, before taking them home. Make sure that your name is accurately spelled, that your address is correct. Check the name and dose of your medication to make sure it is accurate. Check the number of pills, and the written instructions to make sure they are clear and accurate. Make sure that you are given enough medication to last  until your next medication refill appointment. 5. Taking Medication: Take medication as prescribed. Never take more pills than instructed. Never take medication more frequently than prescribed. Taking less pills or less frequently is permitted and encouraged, when it comes to controlled substances (written prescriptions).  6. Inform other Doctors: Always inform, all of your healthcare providers, of all the medications you take. 7. Pain Medication from other Providers: You are not allowed to accept any additional pain medication from any other Doctor or Healthcare provider. There are two exceptions to this rule. (see below) In the event that you require additional pain medication, you are responsible for notifying us, as stated below. 8. Medication Agreement: You are responsible for carefully reading and following our Medication Agreement. This must be signed before receiving any prescriptions from our practice. Safely store a copy of your signed Agreement. Violations to the Agreement will result in no further prescriptions. (Additional copies of our Medication Agreement are available upon request.) 9. Laws, Rules, & Regulations: All patients are expected to follow all Federal and State Laws, Statutes, Rules, & Regulations. Ignorance of the Laws does not constitute a valid excuse. The use of any illegal substances is prohibited. 10. Adopted CDC guidelines & recommendations: Target dosing levels will be at or below 60 MME/day. Use of benzodiazepines** is not recommended.  Exceptions: There are only two exceptions to the rule of not receiving pain medications from other Healthcare Providers. 1. Exception #1 (Emergencies): In the event of an emergency (i.e.: accident requiring emergency care), you are allowed to receive additional pain medication. However, you are responsible for: As soon as you are able, call our office [phone], at any time of the day or night, and leave a message stating your name, the  date and nature of the emergency, and the name and dose of the medication   prescribed. In the event that your call is answered by a member of our staff, make sure to document and save the date, time, and the name of the person that took your information.  2. Exception #2 (Planned Surgery): In the event that you are scheduled by another doctor or dentist to have any type of surgery or procedure, you are allowed (for a period no longer than 30 days), to receive additional pain medication, for the acute post-op pain. However, in this case, you are responsible for picking up a copy of our "Post-op Pain Management for Surgeons" handout, and giving it to your surgeon or dentist. This document is available at our office, and does not require an appointment to obtain it. Simply go to our office during business hours (Monday-Thursday from 8:00 AM to 4:00 PM) (Friday 8:00 AM to 12:00 Noon) or if you have a scheduled appointment with us, prior to your surgery, and ask for it by name. In addition, you will need to provide us with your name, name of your surgeon, type of surgery, and date of procedure or surgery.  *Opioid medications include: morphine, codeine, oxycodone, oxymorphone, hydrocodone, hydromorphone, meperidine, tramadol, tapentadol, buprenorphine, fentanyl, methadone. **Benzodiazepine medications include: diazepam (Valium), alprazolam (Xanax), clonazepam (Klonopine), lorazepam (Ativan), clorazepate (Tranxene), chlordiazepoxide (Librium), estazolam (Prosom), oxazepam (Serax), temazepam (Restoril), triazolam (Halcion) (Last updated: 11/03/2017) ____________________________________________________________________________________________    

## 2017-12-28 NOTE — Progress Notes (Signed)
Patient's Name: John [name]  MRN: [id number]  Referring Provider: Cletis Athens, MD  DOB: 10-Apr-1956  PCP: Cletis Athens, MD  DOS: 12/28/2017  Note by: Vevelyn Francois NP  Service setting: Ambulatory outpatient  Specialty: Interventional Pain Management  Location: [facility] (AMB) Pain Management Facility    Patient type: Established    Primary Reason(s) for Visit: Encounter for prescription drug management. (Level of risk: moderate)  CC: Back Pain (lower bilateral)  HPI  John [name] is a 62 y.o. year old, male patient, who comes today for a medication management evaluation. He has Encounter for therapeutic drug level monitoring; Long term current use of opiate analgesic; Opiate use (7.5 MME/Day); Lumbar spondylosis; Chronic low back pain (Location of Primary Source of Pain) (Bilateral) (L>R); IVP/radiological dye Allergy; Thrombocytopenia ([place]); History of alcoholism; Carpal tunnel syndrome; Clinical depression; Difficulty in walking; Dystonia; Gastric ulcer; HCV (hepatitis C virus); H/O neoplasm; BP (high blood pressure); Decreased leukocytes; Hepatic cirrhosis ([place]); Loss of feeling or sensation; Absence of sensation; Current tobacco use; Has a tremor; Osteoarthritis of spine with radiculopathy, lumbosacral region; Peripheral neuropathy (Alcoholic); Chronic neck pain (Location of Tertiary source of pain) (Bilateral) (R>L); Cervical spondylosis; Obstructive sleep apnea; History of panic attacks; GERD (gastroesophageal reflux disease); Chronic constipation; 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[place]); Leukopenia; Lumbar facet syndrome (Location of Primary Source of Pain) (Bilateral) (L>R); Chronic lumbar radicular pain (Right S1 and Left L5) (Location of Secondary source of pain) (Bilateral) (L>R); Cervical radicular pain (Location of Tertiary source of pain) (Bilateral) (R>L); Chronic obstructive pulmonary disease (COPD) ([place]); Head injury; Epileptic disorder ([place]); Long term prescription opiate use; Encounter for  chronic pain management; Radicular pain of shoulder (Bilateral) (R>L); Opioid-induced constipation ([place]); Personal history of tobacco use, presenting hazards to health; Neurogenic pain; Musculoskeletal pain; Chronic lower extremity pain (Location of Secondary source of pain) (Bilateral) (L>R); Chronic upper extremity pain (Location of Tertiary source of pain) (Bilateral) (R>L); Abnormal MRI, lumbar spine (2015); Lumbar central spinal stenosis (Severe at L4-5; L5-S1); Lumbar lateral recess stenosis (Bilateral L>R at L4-5; Left sided at L5-S1); Lumbar foraminal stenosis (Left L5-S1); Lumbar facet hypertrophy (L4-5 & L5-S1 L>R); Abnormal MRI, cervical spine (2014); Cervical foraminal stenosis (Right sided C3-4 & C4-5) (Left-sided C5-6 and C7-T1) (Bilateral C6-7); Splenomegaly; Chronic pain syndrome; Chronic thumb pain (Left); Bilateral carpal tunnel syndrome; Foot pain, right; History of brain tumor; De Quervain's tenosynovitis, left; Nodule of upper lobe of left lung; and Goals of care, counseling/discussion on their problem list. His primarily concern today is the Back Pain (lower bilateral)  Pain Assessment: Location: Lower, Left, Right Back Radiating: down both legs at times.  Onset: More than a month ago Duration: Chronic pain Quality: Discomfort, Constant, Aching, Sharp Severity: 4 /10 (self-reported pain score)  Note: Reported level is compatible with observation.                          Effect on ADL: pain slows him down, takes longer to get activities done.  Timing: Constant Modifying factors: medications, sometimes rest  John [name] was last scheduled for an appointment on 09/29/2017 for medication management. During today's appointment we reviewed John [name] chronic pain status, as well as his outpatient medication regimen. He admits that he was recently diagnosed with Stage 1 lung cancer and is getting ready to undergo radiation therapy. He admits that he continues to have increased muscle  spasms in his thigh. He does not feel like the magnesium is effective. He admits that he discontinued  the vitamin  B12 secondary to a rash. He admits that he was started on Zytrec by his PCP which has been effective. He is concern that the rash may resurface.   The patient  reports that he does not use drugs. His body mass index is 26.04 kg/m.  Further details on both, my assessment(s), as well as the proposed treatment plan, please see below.  Controlled Substance Pharmacotherapy Assessment REMS (Risk Evaluation and Mitigation Strategy)  Analgesic:Oxycodone 2.5 one every 12 hours (5 mg/day of oxycodone) MME/day:7.5 mg/day  John Billow, RN  12/28/2017  9:23 AM  Sign at close encounter Nursing Pain Medication Assessment:  Safety precautions to be maintained throughout the outpatient stay will include: orient to surroundings, keep bed in low position, maintain call bell within reach at all times, provide assistance with transfer out of bed and ambulation.  Medication Inspection Compliance: John [name] did not comply with our request to bring his pills to be counted. He was reminded that bringing the medication bottles, even when empty, is a requirement.  Medication: None brought in. Pill/Patch Count: None available to be counted. Bottle Appearance: No container available. Did not bring bottle(s) to appointment. Filled Date: N/A Last Medication intake:  Today   Pharmacokinetics: Liberation and absorption (onset of action): WNL Distribution (time to peak effect): WNL Metabolism and excretion (duration of action): WNL         Pharmacodynamics: Desired effects: Analgesia: John [name] reports >50% benefit. Functional ability: Patient reports that medication allows him to accomplish basic ADLs Clinically meaningful improvement in function (CMIF): Sustained CMIF goals met Perceived effectiveness: Described as relatively effective, allowing for increase in activities of daily living  (ADL) Undesirable effects: Side-effects or Adverse reactions: None reported Monitoring: [place] PMP: Online review of the past 26-monthperiod conducted. Compliant with practice rules and regulations Last UDS on record: Summary  Date Value Ref Range Status  06/30/2017 FINAL  Final    Comment:    ==================================================================== TOXASSURE SELECT 13 (MW) ==================================================================== Test                             Result       Flag       Units Drug Present and Declared for Prescription Verification   Oxycodone                      216          EXPECTED   ng/mg creat   Oxymorphone                    113          EXPECTED   ng/mg creat   Noroxycodone                   504          EXPECTED   ng/mg creat    Sources of oxycodone include scheduled prescription medications.    Oxymorphone and noroxycodone are expected metabolites of    oxycodone. Oxymorphone is also available as a scheduled    prescription medication. Drug Absent but Declared for Prescription Verification   Alprazolam                     Not Detected UNEXPECTED ng/mg creat ==================================================================== Test  Result    Flag   Units      Ref Range   Creatinine              45               mg/dL      >=20 ==================================================================== Declared Medications:  The flagging and interpretation on this report are based on the  following declared medications.  Unexpected results may arise from  inaccuracies in the declared medications.  **Note: The testing scope of this panel includes these medications:  Alprazolam (Xanax)  Oxycodone (Roxicodone)  **Note: The testing scope of this panel does not include following  reported medications:  Baclofen  Cyanocobalamin  Folic acid (Folvite)  Gabapentin  Magnesium  (Mag-Ox) ==================================================================== For clinical consultation, please call [phone]. ====================================================================    UDS interpretation: Compliant          Medication Assessment Form: Reviewed. Patient indicates being compliant with therapy Treatment compliance: Compliant Risk Assessment Profile: Aberrant behavior: See prior evaluations. None observed or detected today Comorbid factors increasing risk of overdose: See prior notes. No additional risks detected today Risk of substance use disorder (SUD): Low Opioid Risk Tool - 12/28/17 0925      Family History of Substance Abuse   Alcohol  Positive Male    Illegal Drugs  Negative    Rx Drugs  Negative      Personal History of Substance Abuse   Alcohol  Positive Male or Male    Illegal Drugs  Negative    Rx Drugs  Negative      Psychological Disease   Psychological Disease  Positive    ADD  Negative    OCD  Negative    Bipolar  Negative    Schizophrenia  Negative    Depression  Positive taking xanax for anxiety and depression.  only takes it as needed    taking xanax for anxiety and depression.  only takes it as needed      Total Score   Opioid Risk Tool Scoring  9    Opioid Risk Interpretation  High Risk      ORT Scoring interpretation table:  Score <3 = Low Risk for SUD  Score between 4-7 = Moderate Risk for SUD  Score >8 = High Risk for Opioid Abuse   Risk Mitigation Strategies:  Patient Counseling: Covered Patient-Prescriber Agreement (PPA): Present and active  Notification to other healthcare providers: Done  Pharmacologic Plan: No change in therapy, at this time.             Laboratory Chemistry  Inflammation Markers (CRP: Acute Phase) (ESR: Chronic Phase) Lab Results  Component Value Date   CRP 0.5 09/22/2015   ESRSEDRATE 15 09/22/2015                         Rheumatology Markers No results found for: RF, ANA,  Therisa Doyne, [facility]                      Renal Function Markers Lab Results  Component Value Date   BUN 11 05/19/2017   CREATININE 0.68 05/19/2017   GFRAA >60 05/19/2017   GFRNONAA >60 05/19/2017                              Hepatic Function Markers Lab Results  Component Value Date   AST 27 05/19/2017  ALT 17 05/19/2017   ALBUMIN 4.4 05/19/2017   ALKPHOS 58 05/19/2017   HCVAB <0.1 11/06/2015                        Electrolytes Lab Results  Component Value Date   NA 137 05/19/2017   K 4.1 05/19/2017   CL 100 (L) 05/19/2017   CALCIUM 8.9 05/19/2017   MG 1.9 09/22/2015                        Neuropathy Markers Lab Results  Component Value Date   YHCWCBJS28 315 08/08/2015   FOLATE 68.8 08/08/2015                        Bone Pathology Markers No results found for: VD25OH, VV616WV3XTG, GY6948NI6, EV0350KX3, 25OHVITD1, 25OHVITD2, 25OHVITD3, TESTOFREE, TESTOSTERONE                       Coagulation Parameters Lab Results  Component Value Date   INR 1.02 05/19/2017   LABPROT 13.3 05/19/2017   APTT 33 08/08/2015   PLT 76 (L) 05/19/2017                        Cardiovascular Markers Lab Results  Component Value Date   CKTOTAL 45 04/17/2012   CKMB 0.8 04/17/2012   TROPONINI < 0.02 04/17/2012   HGB 13.2 05/19/2017   HCT 37.4 (L) 05/19/2017                         CA Markers No results found for: CEA, CA125, LABCA2                      Note: Lab results reviewed.  Recent Diagnostic Imaging Results  NM PET Image Initial (PI) Skull Base To Thigh CLINICAL DATA:  Initial treatment strategy for left apical pulmonary nodule.  EXAM: NUCLEAR MEDICINE PET SKULL BASE TO THIGH  TECHNIQUE: 9.5 mCi F-18 FDG was injected intravenously. Full-ring PET imaging was performed from the skull base to thigh after the radiotracer. CT data was obtained and used for attenuation correction and anatomic localization.  Fasting blood glucose: 97  mg/dl  Mediastinal blood pool activity: SUV max 2.0  COMPARISON:  11/01/2017 chest CT  FINDINGS: NECK: No significant abnormal hypermetabolic activity in the neck.  Incidental CT findings: Chronic right maxillary sinusitis. Bilateral carotid atherosclerotic calcification.  CHEST: The 1.0 cm in diameter apicoposterior segment left upper lobe pulmonary nodule has a maximum SUV of 4.1. No other hypermetabolic lesions are observed in the chest. No hypermetabolic adenopathy.  Incidental CT findings: Coronary, aortic arch, and branch vessel atherosclerotic vascular disease. Paraseptal emphysema.  ABDOMEN/PELVIS: No significant abnormal hypermetabolic activity in the abdomen/pelvis.  Incidental CT findings: Hepatic morphology compatible with cirrhosis. Multiple small gallstones. Borderline splenomegaly. Aortoiliac atherosclerotic vascular disease. Several small hypodense hepatic lesions are technically nonspecific but not hypermetabolic.  SKELETON: No hypermetabolic skeletal lesion.  Incidental CT findings: Lower lumbar degenerative facet arthropathy.  IMPRESSION: 1. Hypermetabolic 1.0 cm left apical pulmonary nodule favoring malignancy. Assuming non-small cell lung cancer this represents T1a N0 M0 disease (stage IA). 2. Other imaging findings of potential clinical significance: Chronic right maxillary sinusitis. Aortic Atherosclerosis (ICD10-I70.0) and Emphysema (ICD10-J43.9). Coronary atherosclerosis. Hepatic cirrhosis. Cholelithiasis. Borderline splenomegaly.  Electronically Signed   By: Van Clines M.D.   On: 11/24/2017  16:27  Complexity Note: Imaging results reviewed. Results shared with Mr. Rezendes, using Layman's terms.                         Meds   Current Outpatient Medications:  .  ALPRAZolam (XANAX) 0.25 MG tablet, Take 0.25 mg by mouth at bedtime as needed for anxiety., Disp: , Rfl:  .  [START ON 01/19/2018] baclofen (LIORESAL) 10 MG tablet, Take 1 tablet  (10 mg total) by mouth 3 (three) times daily., Disp: 270 tablet, Rfl: 0 .  cetirizine (ZYRTEC) 10 MG tablet, Take 10 mg by mouth daily., Disp: , Rfl: 3 .  folic acid (FOLVITE) 1 MG tablet, Take 1 mg by mouth daily. , Disp: , Rfl:  .  [START ON 01/19/2018] gabapentin (NEURONTIN) 600 MG tablet, Take 1 tablet (600 mg total) by mouth 3 (three) times daily., Disp: 270 tablet, Rfl: 0 .  [START ON 03/20/2018] oxyCODONE (OXY IR/ROXICODONE) 5 MG immediate release tablet, Take 0.5 tablets (2.5 mg total) by mouth 2 (two) times daily., Disp: 30 tablet, Rfl: 0 .  Magnesium Oxide 500 MG CAPS, Take 1 capsule (500 mg total) by mouth 2 (two) times daily at 8 am and 10 pm., Disp: 180 capsule, Rfl: 0 .  [START ON 02/18/2018] oxyCODONE (OXY IR/ROXICODONE) 5 MG immediate release tablet, Take 0.5 tablets (2.5 mg total) by mouth 2 (two) times daily., Disp: 30 tablet, Rfl: 0 .  [START ON 01/19/2018] oxyCODONE (OXY IR/ROXICODONE) 5 MG immediate release tablet, Take 0.5 tablets (2.5 mg total) by mouth 2 (two) times daily., Disp: 30 tablet, Rfl: 0  ROS  Constitutional: Denies any fever or chills Gastrointestinal: No reported hemesis, hematochezia, vomiting, or acute GI distress Musculoskeletal: Denies any acute onset joint swelling, redness, loss of ROM, or weakness Neurological: No reported episodes of acute onset apraxia, aphasia, dysarthria, agnosia, amnesia, paralysis, loss of coordination, or loss of consciousness  Allergies  Mr. Ferrick is allergic to codeine; codeine sulfate; morphine; and duloxetine.  [place]  Drug: Mr. Poer  reports that he does not use drugs. Alcohol:  reports that he does not drink alcohol. Tobacco:  reports that he has been smoking cigarettes.  He has a 17.50 pack-year smoking history. He has never used smokeless tobacco. 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[place]) (06/23/2015), COPD (chronic obstructive pulmonary disease) ([place]), De Quervain's tenosynovitis, left (07/07/2017), Depression, Dystonia, Epileptic disorder ([place]) (06/23/2015), Extremity pain (05/06/2014), Febrile seizures ([place]), Foot pain (09/11/2014), Gastric ulcer (06/23/2015), Gout, H/O neoplasm (06/23/2015), Head injury (06/23/2015), Hemorrhoids, Hepatic cirrhosis ([place]) (06/23/2015), Hepatitis B, History of brain tumor (07/07/2017), History of GI bleed, Hypertension, Leukopenia, Neurogenic pain (01/21/2016), Nodule of upper lobe of left lung (12/02/2017), Obstructive sleep apnea (06/23/2015), Osteoarthritis of spine with radiculopathy, lumbar region (06/23/2015), Osteoarthritis of spine with radiculopathy, lumbosacral region (06/23/2015), Peripheral neuropathy (06/23/2015), Peripheral neuropathy (Alcoholic) (41/93/7902), Personal history of tobacco use, presenting hazards to health (01/09/2016), Pulse irregularity, Seizures ([place]), Spinal stenosis, Splenomegaly (05/30/2013), Thrombocytopenia ([place]) (40/97/3532), and Uncomplicated opioid dependence ([place]) (06/23/2015). Surgical: Mr. Cherubini  has a past surgical history that includes surgery for cervical neck fracture; Esophagogastroduodenoscopy (11/2013); and Tooth Extraction (Right, 09/28/2016). Family: family history includes Cancer in his father; Cancer (age of onset: 1) in his brother; Heart disease in his father; Stroke in his father and mother.  Constitutional Exam  General appearance: Well nourished,  well developed, and well hydrated. In no apparent acute distress Vitals:   12/28/17 0922  BP: 129/66  Pulse: 99  Resp: 16  Temp: 98.2 F (36.8 C)  TempSrc: Oral  SpO2: 97%  Weight: 192 lb (87.1 kg)  Height: 6' (1.829 m)   BMI Assessment: Estimated body mass index is 26.04 kg/m as calculated from the following:   Height as of this encounter: 6' (1.829 m).   Weight as of this encounter: 192 lb (87.1  kg). Psych/Mental status: Alert, oriented x 3 (person, place, & time)       Eyes: PERLA Respiratory: No evidence of acute respiratory distress   Lumbar Spine Area Exam  Skin & Axial Inspection: No masses, redness, or swelling Alignment: Symmetrical Functional ROM: Unrestricted ROM       Stability: No instability detected Muscle Tone/Strength: Functionally intact. No obvious neuro-muscular anomalies detected. Sensory (Neurological): Unimpaired Palpation: Complains of area being tender to palpation       Provocative Tests: Lumbar Hyperextension and rotation test: evaluation deferred today       Lumbar Lateral bending test: evaluation deferred today       Patrick's Maneuver: evaluation deferred today                    Gait & Posture Assessment  Ambulation: Unassisted Gait: Relatively normal for age and body habitus Posture: WNL   Lower Extremity Exam    Side: Right lower extremity  Side: Left lower extremity  Skin & Extremity Inspection: Skin color, temperature, and hair growth are WNL. No peripheral edema or cyanosis. No masses, redness, swelling, asymmetry, or associated skin lesions. No contractures.  Skin & Extremity Inspection: Skin color, temperature, and hair growth are WNL. No peripheral edema or cyanosis. No masses, redness, swelling, asymmetry, or associated skin lesions. No contractures.  Functional ROM: Unrestricted ROM          Functional ROM: Unrestricted ROM          Muscle Tone/Strength: Functionally intact. No obvious neuro-muscular anomalies detected.  Muscle Tone/Strength: Functionally intact. No obvious neuro-muscular anomalies detected.  Sensory (Neurological): Unimpaired  Sensory (Neurological): Unimpaired  Palpation: No palpable anomalies  Palpation: No palpable anomalies   Assessment  Primary Diagnosis & Pertinent Problem List: The primary encounter diagnosis was Cervical spondylosis. Diagnoses of Lumbar spondylosis, Musculoskeletal pain, Chronic pain syndrome,  and Long term prescription opiate use were also pertinent to this visit.  Status Diagnosis  Controlled Controlled Persistent 1. Cervical spondylosis   2. Lumbar spondylosis   3. Musculoskeletal pain   4. Chronic pain syndrome   5. Long term prescription opiate use     Problems updated and reviewed during this visit: No problems updated. Plan of Care  Pharmacotherapy (Medications Ordered): Meds ordered this encounter  Medications  . baclofen (LIORESAL) 10 MG tablet    Sig: Take 1 tablet (10 mg total) by mouth 3 (three) times daily.    Dispense:  270 tablet    Refill:  0    Do not place this medication, or any other prescription from our practice, on "Automatic Refill". Patient may have prescription filled one day early if pharmacy is closed on scheduled refill date.    Order Specific Question:   Supervising Provider    Answer:   Milinda Pointer [phone]  . gabapentin (NEURONTIN) 600 MG tablet    Sig: Take 1 tablet (600 mg total) by mouth 3 (three) times daily.    Dispense:  270 tablet    Refill:  0    Order Specific Question:   Supervising Provider    Answer:   Milinda Pointer [phone]  . oxyCODONE (OXY IR/ROXICODONE) 5 MG immediate release tablet    Sig: Take 0.5 tablets (2.5 mg total) by mouth 2 (two) times daily.    Dispense:  30 tablet    Refill:  0    Do not place this medication, or any other prescription from our practice, on "Automatic Refill". Patient may have prescription filled one day early if pharmacy is closed on scheduled refill date. Do not fill until:03/20/2018 To last until:04/19/2018    Order Specific Question:   Supervising Provider    Answer:   Milinda Pointer [phone]  . oxyCODONE (OXY IR/ROXICODONE) 5 MG immediate release tablet    Sig: Take 0.5 tablets (2.5 mg total) by mouth 2 (two) times daily.    Dispense:  30 tablet    Refill:  0    Do not place this medication, or any other prescription from our practice, on "Automatic Refill". Patient may  have prescription filled one day early if pharmacy is closed on scheduled refill date. Do not fill until:02/18/2018 To last until:03/20/2018    Order Specific Question:   Supervising Provider    Answer:   Milinda Pointer [phone]  . oxyCODONE (OXY IR/ROXICODONE) 5 MG immediate release tablet    Sig: Take 0.5 tablets (2.5 mg total) by mouth 2 (two) times daily.    Dispense:  30 tablet    Refill:  0    Do not place this medication, or any other prescription from our practice, on "Automatic Refill". Patient may have prescription filled one day early if pharmacy is closed on scheduled refill date. Do not fill until: 01/19/2018 To last until: 02/18/2018    Order Specific Question:   Supervising Provider    Answer:   Milinda Pointer [phone]   New Prescriptions   No medications on file   Medications administered today: Michale Emmerich. Natter had no medications administered during this visit. Lab-work, procedure(s), and/or referral(s): Orders Placed This Encounter  Procedures  . ToxASSURE Select 13 (MW), Urine   Imaging and/or referral(s): None  Interventional therapies: Planned, scheduled, and/or pending: Not at this time.   Considering:  Diagnostic left L5-S1 lumbar epidural steroid injection  Diagnostic bilateral L4-5 transforaminal epidural steroid injection  Diagnostic bilateral lumbar facet block  Diagnostic right-sided cervical epidural steroid injection    Palliative PRN treatment(s):  Not at this time.      Provider-requested follow-up: Return in about 3 months (around 03/29/2018) for MedMgmt with Me Donella Stade Edison Pace).  Future Appointments  Date Time Provider [place]  01/05/2018  1:30 PM [facility]-RO CT SIM [facility] None  01/17/2018 11:30 AM [id number] [facility] None  01/19/2018 11:30 AM [id number] [facility] None  01/24/2018 11:30 AM [id number] [facility] None  01/26/2018  9:30 AM [id number] [facility] None  02/01/2018  9:30 AM [id number]  [facility] None  03/27/2018  9:30 AM Vevelyn Francois, NP [facility] None   Primary Care Physician: Cletis Athens, MD Location: [facility] Outpatient Pain Management Facility Note by: Vevelyn Francois NP Date: 12/28/2017; Time: 10:16 AM  Pain Score Disclaimer: We use the NRS-11 scale. This is a self-reported, subjective measurement of pain severity with only modest accuracy. It is used primarily to identify changes within a particular patient. It must be understood that outpatient pain scales are significantly less accurate that those used for research, where they can be applied under ideal controlled circumstances with minimal exposure to variables. In  reality, the score is likely to be a combination of pain intensity and pain affect, where pain affect describes the degree of emotional arousal or changes in action readiness caused by the sensory experience of pain. Factors such as social and work situation, setting, emotional state, anxiety levels, expectation, and prior pain experience may influence pain perception and show large inter-individual differences that may also be affected by time variables.  Patient instructions provided during this appointment: Patient Instructions  ____________________________________________________________________________________________  Medication Rules  Applies to: All patients receiving prescriptions (written or electronic).  Pharmacy of record: Pharmacy where electronic prescriptions will be sent. If written prescriptions are taken to a different pharmacy, please inform the nursing staff. The pharmacy listed in the electronic medical record should be the one where you would like electronic prescriptions to be sent.  Prescription refills: Only during scheduled appointments. Applies to both, written and electronic prescriptions.  NOTE: The following applies primarily to controlled substances (Opioid* Pain Medications).   Patient's responsibilities: 1. Pain Pills:  Bring all pain pills to every appointment (except for procedure appointments). 2. Pill Bottles: Bring pills in original pharmacy bottle. Always bring newest bottle. Bring bottle, even if empty. 3. Medication refills: You are responsible for knowing and keeping track of what medications you need refilled. The day before your appointment, write a list of all prescriptions that need to be refilled. Bring that list to your appointment and give it to the admitting nurse. Prescriptions will be written only during appointments. If you forget a medication, it will not be "Called in", "Faxed", or "electronically sent". You will need to get another appointment to get these prescribed. 4. Prescription Accuracy: You are responsible for carefully inspecting your prescriptions before leaving our office. Have the discharge nurse carefully go over each prescription with you, before taking them home. Make sure that your name is accurately spelled, that your address is correct. Check the name and dose of your medication to make sure it is accurate. Check the number of pills, and the written instructions to make sure they are clear and accurate. Make sure that you are given enough medication to last until your next medication refill appointment. 5. Taking Medication: Take medication as prescribed. Never take more pills than instructed. Never take medication more frequently than prescribed. Taking less pills or less frequently is permitted and encouraged, when it comes to controlled substances (written prescriptions).  6. Inform other Doctors: Always inform, all of your healthcare providers, of all the medications you take. 7. Pain Medication from other Providers: You are not allowed to accept any additional pain medication from any other Doctor or Healthcare provider. There are two exceptions to this rule. (see below) In the event that you require additional pain medication, you are responsible for notifying us, as stated  below. 8. Medication Agreement: You are responsible for carefully reading and following our Medication Agreement. This must be signed before receiving any prescriptions from our practice. Safely store a copy of your signed Agreement. Violations to the Agreement will result in no further prescriptions. (Additional copies of our Medication Agreement are available upon request.) 9. Laws, Rules, & Regulations: All patients are expected to follow all Federal and [employer], [employer], Rules, [employer]. Ignorance of the Laws does not constitute a valid excuse. The use of any illegal substances is prohibited. 10. Adopted CDC guidelines & recommendations: Target dosing levels will be at or below 60 MME/day. Use of benzodiazepines** is not recommended.  Exceptions: There are only two exceptions to  the rule of not receiving pain medications from other Healthcare Providers. 1. Exception #1 (Emergencies): In the event of an emergency (i.e.: accident requiring emergency care), you are allowed to receive additional pain medication. However, you are responsible for: As soon as you are able, call our office [phone], at any time of the day or night, and leave a message stating your name, the date and nature of the emergency, and the name and dose of the medication prescribed. In the event that your call is answered by a member of our staff, make sure to document and save the date, time, and the name of the person that took your information.  2. Exception #2 (Planned Surgery): In the event that you are scheduled by another doctor or dentist to have any type of surgery or procedure, you are allowed (for a period no longer than 30 days), to receive additional pain medication, for the acute post-op pain. However, in this case, you are responsible for picking up a copy of our "Post-op Pain Management for Surgeons" handout, and giving it to your surgeon or dentist. This document is available at our office, and does not  require an appointment to obtain it. Simply go to our office during business hours (Monday-Thursday from 8:00 AM to 4:00 PM) (Friday 8:00 AM to 12:00 Noon) or if you have a scheduled appointment with [place], prior to your surgery, and ask for it by name. In addition, you will need to provide [place] with your name, name of your surgeon, type of surgery, and date of procedure or surgery.  *Opioid medications include: morphine, codeine, oxycodone, oxymorphone, hydrocodone, hydromorphone, meperidine, tramadol, tapentadol, buprenorphine, fentanyl, methadone. **Benzodiazepine medications include: diazepam (Valium), alprazolam (Xanax), clonazepam (Klonopine), lorazepam (Ativan), clorazepate (Tranxene), chlordiazepoxide (Librium), estazolam (Prosom), oxazepam (Serax), temazepam (Restoril), triazolam (Halcion) (Last updated: 11/03/2017) ____________________________________________________________________________________________

## 2017-12-28 NOTE — Progress Notes (Signed)
Nursing Pain Medication Assessment:  Safety precautions to be maintained throughout the outpatient stay will include: orient to surroundings, keep bed in low position, maintain call bell within reach at all times, provide assistance with transfer out of bed and ambulation.  Medication Inspection Compliance: John [name] did not comply with our request to bring his pills to be counted. He was reminded that bringing the medication bottles, even when empty, is a requirement.  Medication: None brought in. Pill/Patch Count: None available to be counted. Bottle Appearance: No container available. Did not bring bottle(s) to appointment. Filled Date: N/A Last Medication intake:  Today

## 2018-01-03 LAB — TOXASSURE SELECT 13 (MW), URINE

## 2018-01-05 ENCOUNTER — Ambulatory Visit
Admission: RE | Admit: 2018-01-05 | Discharge: 2018-01-05 | Disposition: A | Discharge status: Home / Self Care | Payer: Medicare PPO | Source: Ambulatory Visit | Attending: Radiation Oncology | Admitting: Radiation Oncology

## 2018-01-05 DIAGNOSIS — Z510 Encounter for antineoplastic radiation therapy: Secondary | ICD-10-CM | POA: Diagnosis not present

## 2018-01-05 DIAGNOSIS — C3412 Malignant neoplasm of upper lobe, left bronchus or lung: Secondary | ICD-10-CM | POA: Insufficient documentation

## 2018-01-11 DIAGNOSIS — Z510 Encounter for antineoplastic radiation therapy: Secondary | ICD-10-CM | POA: Diagnosis not present

## 2018-01-17 ENCOUNTER — Other Ambulatory Visit: Payer: Self-pay

## 2018-01-17 ENCOUNTER — Inpatient Hospital Stay: Payer: Medicare PPO | Attending: Hematology and Oncology | Admitting: Hematology and Oncology

## 2018-01-17 ENCOUNTER — Ambulatory Visit
Admission: RE | Admit: 2018-01-17 | Discharge: 2018-01-17 | Disposition: A | Discharge status: Home / Self Care | Payer: Medicare PPO | Source: Ambulatory Visit | Attending: Radiation Oncology | Admitting: Radiation Oncology

## 2018-01-17 ENCOUNTER — Encounter: Payer: Self-pay | Admitting: Hematology and Oncology

## 2018-01-17 VITALS — BP 116/70 | HR 48 | Temp 96.2°F | Resp 18 | Wt 196.7 lb

## 2018-01-17 DIAGNOSIS — F17210 Nicotine dependence, cigarettes, uncomplicated: Secondary | ICD-10-CM

## 2018-01-17 DIAGNOSIS — R161 Splenomegaly, not elsewhere classified: Secondary | ICD-10-CM | POA: Diagnosis not present

## 2018-01-17 DIAGNOSIS — I77811 Abdominal aortic ectasia: Secondary | ICD-10-CM

## 2018-01-17 DIAGNOSIS — Z79899 Other long term (current) drug therapy: Secondary | ICD-10-CM

## 2018-01-17 DIAGNOSIS — R911 Solitary pulmonary nodule: Secondary | ICD-10-CM | POA: Diagnosis not present

## 2018-01-17 DIAGNOSIS — R001 Bradycardia, unspecified: Secondary | ICD-10-CM

## 2018-01-17 DIAGNOSIS — K7460 Unspecified cirrhosis of liver: Secondary | ICD-10-CM

## 2018-01-17 DIAGNOSIS — D696 Thrombocytopenia, unspecified: Secondary | ICD-10-CM | POA: Diagnosis present

## 2018-01-17 DIAGNOSIS — Z510 Encounter for antineoplastic radiation therapy: Secondary | ICD-10-CM | POA: Diagnosis not present

## 2018-01-17 DIAGNOSIS — I499 Cardiac arrhythmia, unspecified: Secondary | ICD-10-CM | POA: Diagnosis not present

## 2018-01-17 NOTE — Progress Notes (Signed)
Patient here today for follow up.   

## 2018-01-17 NOTE — Progress Notes (Signed)
[facility] day:  01/17/18   Chief Complaint: John [name] is a 62 y.o. 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  HPI:  The patient was last seen in the medical oncology clinic on 12/02/2017.  At that time, he denied any complaint.  He denied any bruising or bleeding.  Exam was stable.  Chest and PET scan revealed a 1 cm nodule suggestive of lung cancer.  He was referred in consultation to Dr Genevive Bi and Dr Baruch Gouty.  He saw Dr. Genevive Bi on 12/09/2017.  FEV1 was 49% predicted.  DLCO was 60% predicted. His surgical risks were felt exacerbated by his poor pulmonary functions, cirrhosis and thrombocytopenia. The option of SBRT for his left upper lobe lesion was discussed.   He saw Dr. Baruch Gouty on 12/28/2017.  Discuss was held regarding SBRT to the left upper lobe lesion.  Plan is to deliver 6000 cGy in 5 fractions.  He underwent simulation on 01/05/2018.  During the interim, patient has been more fatigued as of late. He is anxious about his radiation treatments. Patient states, "I have never done radiation before, so I didn't know what to expect". Patient had his initial treatment this morning.  He notes that he had to take a "half of a Xanax" prior to coming.  Patient denies B symptoms and recent infections. He presents today BRADYcardic, with a rate of 48. Patient denies chest pain, shortness of breath,  palpitations, and vertigo symptoms. He is eating well. Weight has increased 4 pounds.   Generalized pain rated 5/10 in clinic today.    Past Medical History:  Diagnosis Date  . Abnormal MRI, lumbar spine (2015) 01/21/2016   FINDINGS:  L3-4: Tiny right foraminal and extra foraminal disc bulge adjacent to but not compressing the L3 nerve lateral to the neural foramen. L4-5: There is a small broad-based disc bulge. There is also hypertrophy of the ligamentum  flavum and facet joints, left more than right creating moderately severe spinal stenosis and bilateral lateral recess stenosis, left greater than right. This appe  . Anxiety   . Back ache 05/06/2014  . BP (high blood pressure) 06/23/2015  . Cervical pain 05/06/2014  . 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[place]) 06/23/2015  . COPD (chronic obstructive pulmonary disease) ([place])   . De Quervain's tenosynovitis, left 07/07/2017  . Depression   . Dystonia   . Epileptic disorder ([place]) 06/23/2015  . Extremity pain 05/06/2014  . Febrile seizures ([place])    child  . Foot pain 09/11/2014  . Gastric ulcer 06/23/2015  . Gout   . H/O neoplasm 06/23/2015  . Head injury 06/23/2015  . Hemorrhoids   . Hepatic cirrhosis ([place]) 06/23/2015  . Hepatitis B   . History of brain tumor 07/07/2017  . History of GI bleed   . Hypertension   . Leukopenia   . Neurogenic pain 01/21/2016  . Nodule of upper lobe of left lung 12/02/2017  . Obstructive sleep apnea 06/23/2015  . 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 Osteoarthritis of spine with radiculopathy, lumbosacral region 06/23/2015  . Peripheral neuropathy 06/23/2015  . Peripheral neuropathy ([place]) 92/07/9416  . Personal history of tobacco use, presenting hazards to health 01/09/2016  . Pulse irregularity   . Seizures ([place])   . Spinal stenosis   . Splenomegaly 05/30/2013  . Thrombocytopenia ([place]) 06/23/2015  . Uncomplicated opioid dependence ([place]) 06/23/2015  Past Surgical History:  Procedure Laterality Date  . ESOPHAGOGASTRODUODENOSCOPY  11/2013  . surgery for cervical neck fracture    . TOOTH EXTRACTION Right 09/28/2016    Family History  Problem Relation Age of Onset  . Stroke Mother   . Cancer Father   . Heart disease Father   . Stroke Father   . Cancer Brother 45       Lung Cancer    Social History:  reports that he has been smoking cigarettes.  He has a 17.50 pack-year smoking history. He has never used smokeless  tobacco. He reports that he does not drink alcohol or use drugs.  He has not smoked in 20 days.  The patient lives in [place].  The patient is alone today.  Allergies:  Allergies  Allergen Reactions  . Codeine Nausea Only  . Codeine Sulfate Nausea Only  . Morphine Nausea Only  . Duloxetine Rash    Made me sick    Current Medications: Current Outpatient Medications  Medication Sig Dispense Refill  . ALPRAZolam (XANAX) 0.25 MG tablet Take 0.25 mg by mouth at bedtime as needed for anxiety.    Derrill Memo ON 01/19/2018] baclofen (LIORESAL) 10 MG tablet Take 1 tablet (10 mg total) by mouth 3 (three) times daily. 270 tablet 0  . cetirizine (ZYRTEC) 10 MG tablet Take 10 mg by mouth daily.  3  . folic acid (FOLVITE) 1 MG tablet Take 1 mg by mouth daily.     Derrill Memo ON 01/19/2018] gabapentin (NEURONTIN) 600 MG tablet Take 1 tablet (600 mg total) by mouth 3 (three) times daily. 270 tablet 0  . [START ON 03/20/2018] oxyCODONE (OXY IR/ROXICODONE) 5 MG immediate release tablet Take 0.5 tablets (2.5 mg total) by mouth 2 (two) times daily. 30 tablet 0  . Magnesium Oxide 500 MG CAPS Take 1 capsule (500 mg total) by mouth 2 (two) times daily at 8 am and 10 pm. 180 capsule 0   No current facility-administered medications for this visit.     Review of Systems  Constitutional: Positive for malaise/fatigue. Negative for diaphoresis, fever and weight loss (weight up 4 pounds).  HENT: Negative.   Eyes: Negative.   Respiratory: Negative for cough, hemoptysis, sputum production and shortness of breath.   Cardiovascular: Negative for chest pain, palpitations, orthopnea, leg swelling and PND.       BRADYcardic at a rate of 48  Gastrointestinal: Negative for abdominal pain, blood in stool, constipation, diarrhea, melena, nausea and vomiting.  Genitourinary: Negative for dysuria, frequency, hematuria and urgency.  Musculoskeletal: Positive for back pain (chronic lower back pain). Negative for falls, joint pain  and myalgias.  Skin: Negative for itching and rash.  Neurological: Positive for tremors (chronic). Negative for dizziness, weakness and headaches.  Endo/Heme/Allergies: Does not bruise/bleed easily.  Psychiatric/Behavioral: Negative for depression, memory loss and suicidal ideas. The patient is nervous/anxious. The patient does not have insomnia.   All other systems reviewed and are negative.  Physical Exam: Blood pressure 116/70, pulse (!) 48, temperature (!) 96.2 F (35.7 C), temperature source Tympanic, resp. rate 18, weight 196 lb 11.2 oz (89.2 kg), SpO2 97 %. GENERAL:  Well developed, well nourished, gentleman sitting comfortably in the exam room in no acute distress. MENTAL STATUS:  Alert and oriented to person, place and time. HEAD:  Dark hair with gray goatee.  Normocephalic, atraumatic, face symmetric, no Cushingoid features. EYES:  Hazel eyes.  Pupils equal round and reactive to light and accomodation.  No conjunctivitis  or scleral icterus. ENT:  Oropharynx clear without lesion.  Tongue normal. Mucous membranes moist.  RESPIRATORY:  Clear to auscultation without rales, wheezes or rhonchi. CARDIOVASCULAR:  Regular rate and rhythm without murmur, rub or gallop. ABDOMEN:  Soft, non-tender, with active bowel sounds, and no hepatomegaly Spleen tip palpable (no change).  No masses. SKIN:  No rashes, ulcers or lesions. EXTREMITIES: No edema, no skin discoloration or tenderness.  No palpable cords. LYMPH NODES: No palpable cervical, supraclavicular, axillary or inguinal adenopathy  NEUROLOGICAL: Unremarkable. PSYCH:  Appropriate.    Imaging studies: 11/13/2015:  RUQ ultrasound revealed stable cirrhotic changes within the liver.  There were no solid appearing hepatic mass.   09/17/2016:  RUQ ultrasound revealed cirrhotic changes within the liver and no focal solid hepatic mass.   05/25/2017:  Abdominal ultrasound revealed a cirrhotic liver without a focal hepatic mass.  There was mild  splenomegaly.  There was abdominal aortic ectasia. 11/01/2017:  Chest CT nodule follow-up revealed Lung-RADS 4B, suspicious. Interval growth of irregular solid apical left upper lobe pulmonary nodule measuring 9.0 mm in volume derived mean diameter, suspicious for primary bronchogenic carcinoma. 11/24/2017:  PET scan revealed a hypermetabolic 1.0 cm left apical pulmonary nodule favoring malignancy. Assuming non-small cell lung cancer this represents T1a N0 M0 disease (stage IA).   No visits with results within 3 Day(s) from this visit.  Latest known visit with results is:  Clinical Support on 12/28/2017  Component Date Value Ref Range Status  . Summary 12/28/2017 FINAL   Final   Comment: ==================================================================== TOXASSURE SELECT 13 (MW) ==================================================================== Test                             Result       Flag       Units Drug Present and Declared for Prescription Verification   Oxycodone                      405          EXPECTED   ng/mg creat   Noroxycodone                   690          EXPECTED   ng/mg creat    Sources of oxycodone include scheduled prescription medications.    Noroxycodone is an expected metabolite of oxycodone. Drug Absent but Declared for Prescription Verification   Alprazolam                     Not Detected UNEXPECTED ng/mg creat ==================================================================== Test                      Result    Flag   Units      Ref Range   Creatinine              20               mg/dL      >=20 ==================================================================== Declared Medications:  The flagging and interpretation                           on this report are based on the  following declared medications.  Unexpected results may arise from  inaccuracies in the declared medications.  **Note: The testing scope of this panel includes these  medications:  Alprazolam  Oxycodone  **  Note: The testing scope of this panel does not include following  reported medications:  Baclofen  Cetirizine  Folic acid  Gabapentin  Magnesium Oxide ==================================================================== For clinical consultation, please call [phone]. ====================================================================     Assessment:  DEWEY VIENS is a 62 y.o. male with chronic thrombocytopenia since 2013.  He is hepatitis B positive.  He has cirrhosis.  Platelet count has ranged between 55,000 and 72,000 from 11/09/2012 until 10/28/2014. He previously drank a significant amount of alcohol beginning in his teens until 03/23/2012.  Diet is modest.  He denies any new medications or herbal products.  Work-up in 2014 revealed the following normal labs:  [place], B12, Hepatitis B surface antigen, hepatitis C, and HIV testing. ANA was positive with RNP of 1.6 on 12/21/2012. Work-up on 08/08/2015 revealed a normal PTT, B12, folate, liver function tests, and creatinine.  Hepatitis B core antibody total was positive on 08/08/2015.  AFP has been followed:  6.4 on 03/20/2012, 4.7 on 11/09/2012, 3.2 on 12/13/2013, 3.6 on 11/06/2015, 3.1 on 09/14/2016, and 3.5 on 05/19/2017.  Patient has had serial abdominal ultrasounds.  Spleen was 13 cm on 12/26/2012, 14.6 cm on 05/30/2013, and 11.2 cm on 12/11/2013. Abdominal ultrasound revealed splenic volume of 506 cm3 on 12/11/2013 and 718.8 cm3 on 12/23/2014.  Abdominal ultrasound on 05/25/2017 revealed a cirrhotic liver without a focal hepatic mass.  There was mild splenomegaly.  There was abdominal aortic ectasia.  PET scan on 11/24/2017 revealed a hypermetabolic 1.0 cm left apical pulmonary nodule favoring malignancy. Assuming non-small cell lung cancer this represents T1a N0 M0 disease (stage IA).  PFTs on 11/29/2017 revealed severe obstruction with probable bronchodilator effect. FEV1 was 1.62  liter (49% predicted) and post FEV1 was 1.91 liters (57%).  DLCO was 60% and DLCO/VA was 88%.  He is felt to be a poor surgical candidate.  He began SBRT today.  Symptomatically, patient is doing well overall. He denies B symptoms. He does have some fatigue. He began radiation today, which has caused him to be anxious.  He denies any bruising or bleeding.  Exam is stable.  He is bradycardic likely due to taking Xanax today.  Plan: 1.  Disscus consult with surgery. 2.  Discuss consult with radiation oncology. Plans for for SBRT delivering 6000 cGy in 5 fractions. Patient began treatments today.  3.  Discuss BRADYcardia. This is not normal for the patient. EKG showed sinus bradycardia at a rate of 51. There was no evidence of ST segment elevation or depression. Patient was not overly symptomatic in clinic (no CP, SOB, or palpitations).  Likely related to pre-clinic dose of Xanax. Will communicate with PCP.  4.  RTC 1 month after XRT for MD assessment and labs (CBC with diff, CMP).    Honor Loh, NP  01/17/2018, 1:31 PM   I saw and evaluated the patient, participating in the key portions of the service and reviewing pertinent diagnostic studies and records.  I reviewed the nurse practitioner's note and agree with the findings and the plan.  The assessment and plan were discussed with the patient.  Multiple questions were asked by the patient and answered.   Lequita Asal, MD  01/17/2018, 1:31 PM

## 2018-01-19 ENCOUNTER — Ambulatory Visit
Admission: RE | Admit: 2018-01-19 | Discharge: 2018-01-19 | Disposition: A | Discharge status: Home / Self Care | Payer: Medicare PPO | Source: Ambulatory Visit | Attending: Radiation Oncology | Admitting: Radiation Oncology

## 2018-01-19 DIAGNOSIS — Z510 Encounter for antineoplastic radiation therapy: Secondary | ICD-10-CM | POA: Diagnosis not present

## 2018-01-24 ENCOUNTER — Ambulatory Visit
Admission: RE | Admit: 2018-01-24 | Discharge: 2018-01-24 | Disposition: A | Discharge status: Home / Self Care | Payer: Medicare PPO | Source: Ambulatory Visit | Attending: Radiation Oncology | Admitting: Radiation Oncology

## 2018-01-24 DIAGNOSIS — Z510 Encounter for antineoplastic radiation therapy: Secondary | ICD-10-CM | POA: Diagnosis not present

## 2018-01-26 ENCOUNTER — Ambulatory Visit
Admission: RE | Admit: 2018-01-26 | Discharge: 2018-01-26 | Disposition: A | Discharge status: Home / Self Care | Payer: Medicare PPO | Source: Ambulatory Visit | Attending: Radiation Oncology | Admitting: Radiation Oncology

## 2018-01-26 DIAGNOSIS — Z510 Encounter for antineoplastic radiation therapy: Secondary | ICD-10-CM | POA: Diagnosis not present

## 2018-01-31 ENCOUNTER — Ambulatory Visit: Payer: Medicare PPO

## 2018-02-01 ENCOUNTER — Ambulatory Visit
Admission: RE | Admit: 2018-02-01 | Discharge: 2018-02-01 | Disposition: A | Discharge status: Home / Self Care | Payer: Medicare PPO | Source: Ambulatory Visit | Attending: Radiation Oncology | Admitting: Radiation Oncology

## 2018-02-01 DIAGNOSIS — Z510 Encounter for antineoplastic radiation therapy: Secondary | ICD-10-CM | POA: Diagnosis not present

## 2018-03-02 ENCOUNTER — Encounter: Payer: Self-pay | Admitting: Hematology and Oncology

## 2018-03-02 ENCOUNTER — Ambulatory Visit
Admission: RE | Admit: 2018-03-02 | Discharge: 2018-03-02 | Disposition: A | Discharge status: Home / Self Care | Payer: Medicare PPO | Source: Ambulatory Visit | Attending: Radiation Oncology | Admitting: Radiation Oncology

## 2018-03-02 ENCOUNTER — Other Ambulatory Visit: Payer: Self-pay

## 2018-03-02 ENCOUNTER — Inpatient Hospital Stay: Payer: Medicare PPO | Attending: Hematology and Oncology

## 2018-03-02 ENCOUNTER — Encounter (INDEPENDENT_AMBULATORY_CARE_PROVIDER_SITE_OTHER): Payer: Self-pay

## 2018-03-02 ENCOUNTER — Inpatient Hospital Stay (HOSPITAL_BASED_OUTPATIENT_CLINIC_OR_DEPARTMENT_OTHER): Payer: Medicare PPO | Admitting: Hematology and Oncology

## 2018-03-02 ENCOUNTER — Other Ambulatory Visit: Payer: Self-pay | Admitting: *Deleted

## 2018-03-02 ENCOUNTER — Encounter: Payer: Self-pay | Admitting: Radiation Oncology

## 2018-03-02 VITALS — BP 120/68 | HR 62 | Temp 97.0°F | Resp 18 | Wt 197.2 lb

## 2018-03-02 VITALS — BP 110/72 | HR 98 | Temp 97.0°F | Wt 196.9 lb

## 2018-03-02 DIAGNOSIS — B1910 Unspecified viral hepatitis B without hepatic coma: Secondary | ICD-10-CM | POA: Insufficient documentation

## 2018-03-02 DIAGNOSIS — K7460 Unspecified cirrhosis of liver: Secondary | ICD-10-CM | POA: Diagnosis not present

## 2018-03-02 DIAGNOSIS — Z923 Personal history of irradiation: Secondary | ICD-10-CM | POA: Diagnosis not present

## 2018-03-02 DIAGNOSIS — C3412 Malignant neoplasm of upper lobe, left bronchus or lung: Secondary | ICD-10-CM | POA: Diagnosis present

## 2018-03-02 DIAGNOSIS — R918 Other nonspecific abnormal finding of lung field: Secondary | ICD-10-CM

## 2018-03-02 DIAGNOSIS — R0789 Other chest pain: Secondary | ICD-10-CM | POA: Diagnosis not present

## 2018-03-02 DIAGNOSIS — R161 Splenomegaly, not elsewhere classified: Secondary | ICD-10-CM

## 2018-03-02 DIAGNOSIS — Z08 Encounter for follow-up examination after completed treatment for malignant neoplasm: Secondary | ICD-10-CM | POA: Insufficient documentation

## 2018-03-02 DIAGNOSIS — Z79899 Other long term (current) drug therapy: Secondary | ICD-10-CM | POA: Insufficient documentation

## 2018-03-02 DIAGNOSIS — D6959 Other secondary thrombocytopenia: Secondary | ICD-10-CM | POA: Insufficient documentation

## 2018-03-02 DIAGNOSIS — R911 Solitary pulmonary nodule: Secondary | ICD-10-CM

## 2018-03-02 DIAGNOSIS — F17210 Nicotine dependence, cigarettes, uncomplicated: Secondary | ICD-10-CM | POA: Insufficient documentation

## 2018-03-02 DIAGNOSIS — I77811 Abdominal aortic ectasia: Secondary | ICD-10-CM | POA: Insufficient documentation

## 2018-03-02 DIAGNOSIS — I77810 Thoracic aortic ectasia: Secondary | ICD-10-CM

## 2018-03-02 DIAGNOSIS — D696 Thrombocytopenia, unspecified: Secondary | ICD-10-CM

## 2018-03-02 DIAGNOSIS — Z7189 Other specified counseling: Secondary | ICD-10-CM

## 2018-03-02 LAB — COMPREHENSIVE METABOLIC PANEL
ALT: 17 U/L (ref 0–44)
AST: 23 U/L (ref 15–41)
Albumin: 4.5 g/dL (ref 3.5–5.0)
Alkaline Phosphatase: 67 U/L (ref 38–126)
Anion gap: 8 (ref 5–15)
BUN: 11 mg/dL (ref 8–23)
CO2: 28 mmol/L (ref 22–32)
Calcium: 9.3 mg/dL (ref 8.9–10.3)
Chloride: 103 mmol/L (ref 98–111)
Creatinine, Ser: 0.8 mg/dL (ref 0.61–1.24)
GFR calc Af Amer: >60 mL/min (ref >60)
GFR calc non Af Amer: >60 mL/min (ref >60)
Glucose, Bld: 87 mg/dL (ref 70–99)
Potassium: 4.2 mmol/L (ref 3.5–5.1)
Sodium: 139 mmol/L (ref 135–145)
Total Bilirubin: 0.8 mg/dL (ref 0.3–1.2)
Total Protein: 7.7 g/dL (ref 6.5–8.1)

## 2018-03-02 LAB — CBC WITH DIFFERENTIAL/PLATELET
Basophils Absolute: 0 10*3/uL (ref 0–0.1)
Basophils Relative: 1 %
Eosinophils Absolute: 0.1 10*3/uL (ref 0–0.7)
Eosinophils Relative: 3 %
HCT: 37.9 % — ABNORMAL LOW (ref 40.0–52.0)
Hemoglobin: 13.1 g/dL (ref 13.0–18.0)
Lymphocytes Relative: 28 %
Lymphs Abs: 0.8 10*3/uL — ABNORMAL LOW (ref 1.0–3.6)
MCH: 33 pg (ref 26.0–34.0)
MCHC: 34.7 g/dL (ref 32.0–36.0)
MCV: 95 fL (ref 80.0–100.0)
Monocytes Absolute: 0.2 10*3/uL (ref 0.2–1.0)
Monocytes Relative: 8 %
Neutro Abs: 1.8 10*3/uL (ref 1.4–6.5)
Neutrophils Relative %: 60 %
Platelets: 76 10*3/uL — ABNORMAL LOW (ref 150–440)
RBC: 3.99 MIL/uL — ABNORMAL LOW (ref 4.40–5.90)
RDW: 14.6 % — ABNORMAL HIGH (ref 11.5–14.5)
WBC: 3 10*3/uL — ABNORMAL LOW (ref 3.8–10.6)

## 2018-03-02 NOTE — Progress Notes (Signed)
Patient here for follow up. No concerns voiced.  °

## 2018-03-02 NOTE — Progress Notes (Signed)
[facility] day:  03/02/18   Chief Complaint: John [name] is a 62 y.o. 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  HPI:  The patient was last seen in the medical oncology clinic on 01/17/2018.  At that time, he was doing well. He denied B symptoms. He had some fatigue. He had just started radiation that day, which has caused him to be anxious.  He denied any bruising or bleeding.  Exam was stable.  He was bradycardic likely due to taking Xanax that day.  He received SBRT from 01/17/2018 - 02/01/2018.  During the interim, patient is doing "alright". His breathing is stable. He complains of intermittent pain in his upper chest "every now and then". He is unable to identify any provoking or palliating factors. Pain not associated with exertion. Denies nausea, pain exacerbation with deep inspiration, and radiation of pain. Pain attributed to be secondary to recent radiation therapy.  Patient complains of constipation and chronic lower back pain. Patient denies that he has experienced any B symptoms. He denies any interval infections.   Patient advises that he maintains an adequate appetite. He is eating well. Weight today is 196 lb 14.4 oz (89.3 kg), which compared to his last visit to the clinic, represents a stable weight.   Patient complains of pain, rated 3/60min the clinic today.   Past Medical History:  Diagnosis Date  . Abnormal MRI, lumbar spine (2015) 01/21/2016   FINDINGS:  L3-4: Tiny right foraminal and extra foraminal disc bulge adjacent to but not compressing the L3 nerve lateral to the neural foramen. L4-5: There is a small broad-based disc bulge. There is also hypertrophy of the ligamentum flavum and facet joints, left more than right creating moderately severe spinal stenosis and bilateral lateral recess  stenosis, left greater than right. This appe  . Anxiety   . Back ache 05/06/2014  . BP (high blood pressure) 06/23/2015  . Cervical pain 05/06/2014  . 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[place] 06/23/2015  . COPD (chronic obstructive pulmonary disease) ([place]   . De Quervain's tenosynovitis, left 07/07/2017  . Depression   . Dystonia   . Epileptic disorder ([place] 06/23/2015  . Extremity pain 05/06/2014  . Febrile seizures ([place]    child  . Foot pain 09/11/2014  . Gastric ulcer 06/23/2015  . Gout   . H/O neoplasm 06/23/2015  . Head injury 06/23/2015  . Hemorrhoids   . Hepatic cirrhosis ([place] 06/23/2015  . Hepatitis B   . History of brain tumor 07/07/2017  . History of GI bleed   . Hypertension   . Leukopenia   . Neurogenic pain 01/21/2016  . Nodule of upper lobe of left lung 12/02/2017  . Obstructive sleep apnea 06/23/2015  . 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 Osteoarthritis of spine with radiculopathy, lumbosacral region 06/23/2015  . Peripheral neuropathy 06/23/2015  . Peripheral neuropathy (Alcoholic) 110/40/4591 . Personal history of tobacco use, presenting hazards to health 01/09/2016  . Pulse irregularity   . Seizures ([place]   . Spinal stenosis   . Splenomegaly 05/30/2013  . Thrombocytopenia ([place] 06/23/2015  . Uncomplicated opioid dependence ([place] 06/23/2015    Past Surgical History:  Procedure Laterality Date  . ESOPHAGOGASTRODUODENOSCOPY  11/2013  . surgery for cervical neck fracture    . TOOTH EXTRACTION Right 09/28/2016  Family History  Problem Relation Age of Onset  . Stroke Mother   . Cancer Father   . Heart disease Father   . Stroke Father   . Cancer Brother 13       Lung Cancer    Social History:  reports that he has been smoking cigarettes.  He has a 17.50 pack-year smoking history. He has never used smokeless tobacco. He reports that he does not drink alcohol or use drugs.  He has not smoked in 20 days.  The patient lives in  [place].  The patient is alone today.  Allergies:  Allergies  Allergen Reactions  . Codeine Nausea Only  . Codeine Sulfate Nausea Only  . Morphine Nausea Only  . Duloxetine Rash    Made me sick    Current Medications: Current Outpatient Medications  Medication Sig Dispense Refill  . ALPRAZolam (XANAX) 0.25 MG tablet Take 0.25 mg by mouth at bedtime as needed for anxiety.    . baclofen (LIORESAL) 10 MG tablet Take 1 tablet (10 mg total) by mouth 3 (three) times daily. 270 tablet 0  . cetirizine (ZYRTEC) 10 MG tablet Take 10 mg by mouth daily.  3  . folic acid (FOLVITE) 1 MG tablet Take 1 mg by mouth daily.     Marland Kitchen gabapentin (NEURONTIN) 600 MG tablet Take 1 tablet (600 mg total) by mouth 3 (three) times daily. 270 tablet 0  . Magnesium Oxide 500 MG CAPS Take 1 capsule (500 mg total) by mouth 2 (two) times daily at 8 am and 10 pm. 180 capsule 0  . [START ON 03/20/2018] oxyCODONE (OXY IR/ROXICODONE) 5 MG immediate release tablet Take 0.5 tablets (2.5 mg total) by mouth 2 (two) times daily. 30 tablet 0   No current facility-administered medications for this visit.     Review of Systems  Constitutional: Positive for malaise/fatigue. Negative for diaphoresis, fever and weight loss (weight up 4 pounds).       Doing well.  HENT: Negative.   Eyes: Negative.   Respiratory: Negative for cough, hemoptysis, sputum production and shortness of breath.        Upper chest pain felt secondary to radiation.  Cardiovascular: Negative for chest pain, palpitations, orthopnea, leg swelling and PND.  Gastrointestinal: Positive for constipation (occasional). Negative for abdominal pain, blood in stool, diarrhea, melena, nausea and vomiting.  Genitourinary: Negative for dysuria, frequency, hematuria and urgency.  Musculoskeletal: Positive for back pain (chronic lower back pain). Negative for falls, joint pain and myalgias.  Skin: Negative for itching and rash.  Neurological: Positive for tremors  (chronic). Negative for dizziness, weakness and headaches.  Endo/Heme/Allergies: Does not bruise/bleed easily.  Psychiatric/Behavioral: Negative for depression, memory loss and suicidal ideas. The patient is not nervous/anxious and does not have insomnia.   All other systems reviewed and are negative.  Physical Exam: Blood pressure 110/72, pulse 98, temperature (!) 97 F (36.1 C), temperature source Tympanic, weight 196 lb 14.4 oz (89.3 kg), SpO2 96 %. GENERAL:  Well developed, well nourished, gentleman sitting comfortably in the exam room in no acute distress. MENTAL STATUS:  Alert and oriented to person, place and time. HEAD:  Dark hair.  Goatee.  Normocephalic, atraumatic, face symmetric, no Cushingoid features. EYES:  Hazel eyes.  Pupils equal round and reactive to light and accomodation.  No conjunctivitis or scleral icterus. ENT:  Oropharynx clear without lesion.  Tongue normal. Mucous membranes moist.  RESPIRATORY:  Clear to auscultation without rales, wheezes or rhonchi. CARDIOVASCULAR:  Regular  rate and rhythm without murmur, rub or gallop. ABDOMEN:  Soft, non-tender, with active bowel sounds, and no hepatomegaly.  Spleen tip palpable. No masses. SKIN:  No rashes, ulcers or lesions. EXTREMITIES: No edema, no skin discoloration or tenderness.  No palpable cords. LYMPH NODES: No palpable cervical, supraclavicular, axillary or inguinal adenopathy  NEUROLOGICAL: Unremarkable. PSYCH:  Appropriate.   Imaging studies: 11/13/2015:  RUQ ultrasound revealed stable cirrhotic changes within the liver.  There were no solid appearing hepatic mass.   09/17/2016:  RUQ ultrasound revealed cirrhotic changes within the liver and no focal solid hepatic mass.   05/25/2017:  Abdominal ultrasound revealed a cirrhotic liver without a focal hepatic mass.  There was mild splenomegaly.  There was abdominal aortic ectasia. 11/01/2017:  Chest CT nodule follow-up revealed Lung-RADS 4B, suspicious. Interval  growth of irregular solid apical left upper lobe pulmonary nodule measuring 9.0 mm in volume derived mean diameter, suspicious for primary bronchogenic carcinoma. 11/24/2017:  PET scan revealed a hypermetabolic 1.0 cm left apical pulmonary nodule favoring malignancy. Assuming non-small cell lung cancer this represents T1a N0 M0 disease (stage IA).   Appointment on 03/02/2018  Component Date Value Ref Range Status  . Sodium 03/02/2018 139  135 - 145 mmol/L Final  . Potassium 03/02/2018 4.2  3.5 - 5.1 mmol/L Final  . Chloride 03/02/2018 103  98 - 111 mmol/L Final   Please note change in reference range.  . CO2 03/02/2018 28  22 - 32 mmol/L Final  . Glucose, Bld 03/02/2018 87  70 - 99 mg/dL Final   Please note change in reference range.  . BUN 03/02/2018 11  8 - 23 mg/dL Final   Please note change in reference range.  . Creatinine, Ser 03/02/2018 0.80  0.61 - 1.24 mg/dL Final  . Calcium 03/02/2018 9.3  8.9 - 10.3 mg/dL Final  . Total Protein 03/02/2018 7.7  6.5 - 8.1 g/dL Final  . Albumin 03/02/2018 4.5  3.5 - 5.0 g/dL Final  . AST 03/02/2018 23  15 - 41 U/L Final  . ALT 03/02/2018 17  0 - 44 U/L Final   Please note change in reference range.  . Alkaline Phosphatase 03/02/2018 67  38 - 126 U/L Final  . Total Bilirubin 03/02/2018 0.8  0.3 - 1.2 mg/dL Final  . GFR calc non Af Amer 03/02/2018 >60  >60 mL/min Final  . GFR calc Af Amer 03/02/2018 >60  >60 mL/min Final   Comment: (NOTE) The eGFR has been calculated using the CKD EPI equation. This calculation has not been validated in all clinical situations. eGFR's persistently <60 mL/min signify possible Chronic Kidney Disease.   Georgiann Hahn gap 03/02/2018 8  5 - 15 Final   Performed at [facility], [address] [place], [place] [address]  . WBC 03/02/2018 3.0* 3.8 - 10.6 K/uL Final  . RBC 03/02/2018 3.99* 4.40 - 5.90 MIL/uL Final  . Hemoglobin 03/02/2018 13.1  13.0 - 18.0 g/dL Final  . HCT 03/02/2018 37.9* 40.0 - 52.0 % Final   . MCV 03/02/2018 95.0  80.0 - 100.0 fL Final  . MCH 03/02/2018 33.0  26.0 - 34.0 pg Final  . MCHC 03/02/2018 34.7  32.0 - 36.0 g/dL Final  . RDW 03/02/2018 14.6* 11.5 - 14.5 % Final  . Platelets 03/02/2018 76* 150 - 440 K/uL Final  . Neutrophils Relative % 03/02/2018 60  % Final  . Neutro Abs 03/02/2018 1.8  1.4 - 6.5 K/uL Final  . Lymphocytes Relative 03/02/2018 28  %  Final  . Lymphs Abs 03/02/2018 0.8* 1.0 - 3.6 K/uL Final  . Monocytes Relative 03/02/2018 8  % Final  . Monocytes Absolute 03/02/2018 0.2  0.2 - 1.0 K/uL Final  . Eosinophils Relative 03/02/2018 3  % Final  . Eosinophils Absolute 03/02/2018 0.1  0 - 0.7 K/uL Final  . Basophils Relative 03/02/2018 1  % Final  . Basophils Absolute 03/02/2018 0.0  0 - 0.1 K/uL Final   Performed at [facility], [address], [place], [place] [address]    Assessment:  PHINEAS MCENROE is a 62 y.o. male with chronic thrombocytopenia since 2013.  He is hepatitis B positive.  He has cirrhosis.  Platelet count has ranged between 55,000 and 72,000 from 11/09/2012 until 10/28/2014. He previously drank a significant amount of alcohol beginning in his teens until 03/23/2012.  Diet is modest.  He denies any new medications or herbal products.  Work-up in 2014 revealed the following normal labs:  Coombs, B12, Hepatitis B surface antigen, hepatitis C, and HIV testing. ANA was positive with RNP of 1.6 on 12/21/2012. Work-up on 08/08/2015 revealed a normal PTT, B12, folate, liver function tests, and creatinine.  Hepatitis B core antibody total was positive on 08/08/2015.  AFP has been followed:  6.4 on 03/20/2012, 4.7 on 11/09/2012, 3.2 on 12/13/2013, 3.6 on 11/06/2015, 3.1 on 09/14/2016, and 3.5 on 05/19/2017.  Patient has had serial abdominal ultrasounds.  Spleen was 13 cm on 12/26/2012, 14.6 cm on 05/30/2013, and 11.2 cm on 12/11/2013. Abdominal ultrasound revealed splenic volume of 506 cm3 on 12/11/2013 and 718.8 cm3 on 12/23/2014.  Abdominal  ultrasound on 05/25/2017 revealed a cirrhotic liver without a focal hepatic mass.  There was mild splenomegaly.  There was abdominal aortic ectasia.  PET scan on 11/24/2017 revealed a hypermetabolic 1.0 cm left apical pulmonary nodule favoring malignancy. Assuming non-small cell lung cancer this represents T1a N0 M0 disease (stage IA).  PFTs on 11/29/2017 revealed severe obstruction with probable bronchodilator effect. FEV1 was 1.62 liter (49% predicted) and post FEV1 was 1.91 liters (57%).  DLCO was 60% and DLCO/VA was 88%.  He is felt to be a poor surgical candidate.  He received SBRT from 01/17/2018 - 02/01/2018.  Symptomatically, patient is doing well overall. He denies B symptoms. He denies any bruising or bleeding.  He has completed radiation. He notes some upper chest pain that Dr. Baruch Gouty felt was likely related to recent therapy. Exam is stable.    Plan: 1. Labs today:  CBC with diff, CMP, AFP. 2. Discus interval radiation. 3. Discuss abdominal ultrasound every 6 months for [place] screening.  4. Chest CT without contrast on 05/29/2018. 5. RTC after CT for MD assessment and labs (CBC with diff, CMP).    Honor Loh, NP  03/02/2018, 2:08 PM   I saw and evaluated the patient, participating in the key portions of the service and reviewing pertinent diagnostic studies and records.  I reviewed the nurse practitioner's note and agree with the findings and the plan.  The assessment and plan were discussed with the patient.  Several questions were asked by the patient and answered.   Honor Loh, NP  03/02/2018, 2:08 PM

## 2018-03-02 NOTE — Progress Notes (Signed)
Radiation Oncology Follow up Note  Name: John [name]   Date:   03/02/2018 MRN:  [id number] DOB: [birth year]    This 62 y.o. 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  REFERRING PROVIDER: Cletis Athens, MD  HPI: patient is a 62 year old male now seen out 1 month having completed SB RT to his left upper lobe for presumed stage I non-small cell lung cancer. Seen today in routine follow-up he is doing well he's developed a slight nonproductive cough.Marland Kitchene otherwise has no symptoms or complaints.  COMPLICATIONS OF TREATMENT: none  FOLLOW UP COMPLIANCE: keeps appointments   PHYSICAL EXAM:  BP 120/68   Pulse 62   Temp (!) 97 F (36.1 C)   Resp 18   Wt 197 lb 3.2 oz (89.4 kg)   BMI 26.75 kg/m  Well-developed well-nourished patient in NAD. HEENT reveals PERLA, EOMI, discs not visualized.  Oral cavity is clear. No oral mucosal lesions are identified. Neck is clear without evidence of cervical or supraclavicular adenopathy. Lungs are clear to A&P. Cardiac examination is essentially unremarkable with regular rate and rhythm without murmur rub or thrill. Abdomen is benign with no organomegaly or masses noted. Motor sensory and DTR levels are equal and symmetric in the upper and lower extremities. Cranial nerves II through XII are grossly intact. Proprioception is intact. No peripheral adenopathy or edema is identified. No motor or sensory levels are noted. Crude visual fields are within normal range.  RADIOLOGY RESULTS: no current films for review  PLAN: present time he is doing well with no side effects from his previous SB RT. I've asked to see him back in 3 months for follow-up and have scheduled a CT scan of the chest with contrast prior to that visit. Patient knows to call sooner anytime with any concerns.  I would like to take this opportunity to thank you for allowing me to participate  in the care of your patient.Noreene Filbert, MD

## 2018-03-03 LAB — AFP TUMOR MARKER: AFP, Serum, Tumor Marker: 7.4 ng/mL (ref 0.0–8.3)

## 2018-03-07 ENCOUNTER — Ambulatory Visit: Payer: 59

## 2018-03-07 ENCOUNTER — Telehealth: Payer: Self-pay | Admitting: *Deleted

## 2018-03-07 NOTE — Telephone Encounter (Signed)
Per Beverlee Nims 03/06/18 Scheduler message. Patient called to pt called and needs his [place] at [facility] where he has had before-due to his COPAY  Scheduling Notes:   I tried to call patient to find out exactly where he wanted his [place] scheduled at. Because as of right now it's scheduled for 03/17/18.

## 2018-03-17 ENCOUNTER — Telehealth: Payer: Self-pay | Admitting: *Deleted

## 2018-03-17 ENCOUNTER — Other Ambulatory Visit: Payer: Self-pay | Admitting: Urgent Care

## 2018-03-17 ENCOUNTER — Ambulatory Visit
Admission: RE | Admit: 2018-03-17 | Discharge: 2018-03-17 | Disposition: A | Discharge status: Home / Self Care | Payer: Medicare PPO | Source: Ambulatory Visit | Attending: Urgent Care | Admitting: Urgent Care

## 2018-03-17 ENCOUNTER — Ambulatory Visit: Payer: 59

## 2018-03-17 DIAGNOSIS — R911 Solitary pulmonary nodule: Secondary | ICD-10-CM | POA: Insufficient documentation

## 2018-03-17 DIAGNOSIS — K7460 Unspecified cirrhosis of liver: Secondary | ICD-10-CM

## 2018-03-17 DIAGNOSIS — R160 Hepatomegaly, not elsewhere classified: Secondary | ICD-10-CM

## 2018-03-17 DIAGNOSIS — K8020 Calculus of gallbladder without cholecystitis without obstruction: Secondary | ICD-10-CM | POA: Insufficient documentation

## 2018-03-17 DIAGNOSIS — R161 Splenomegaly, not elsewhere classified: Secondary | ICD-10-CM | POA: Insufficient documentation

## 2018-03-17 NOTE — Telephone Encounter (Deleted)
Called patent to let him know his US showed a questionable area in his liver.  Asked him if he would consent to having an MRI.  Patient first told me he would think about it over the weekend and get back to me on Monday. He went to get a pen to write down my number and when he came back to the phone he said he has had a place on his liver since he had hepatitis.  He states the financial aspect of all these tests are a lot.

## 2018-03-17 NOTE — Telephone Encounter (Signed)
-----   Message from Karen Kitchens, NP sent at 03/17/2018  3:14 PM EDT ----- Regarding: Mass Ultrasound showed an area concerning for a neoplastic lesion. MRI imaging recommended. I have entered the order and called financials.   We need to let him know and see if he is willing to do MRI.   John [name]

## 2018-03-17 NOTE — Telephone Encounter (Signed)
Called patient to inform him that his US revealed an area in his liver that we would like to get an MRI of to further evaluate.  He stated the financial end of all his tests are an issue.  He first told me he would like to think about it over the weekend and call me back on Monday.  He put me on hold and when he came back he said he has had an area on his liver for years as a result of hepatitis.  Patient asked that I relay this message and either he will call back on Monday or we can call him.

## 2018-03-20 ENCOUNTER — Telehealth: Payer: Self-pay | Admitting: *Deleted

## 2018-03-20 NOTE — Telephone Encounter (Signed)
-----   Message from Karen Kitchens, NP sent at 03/18/2018  6:56 PM EDT ----- Regarding: Follow up I spoke with Dr. Janese Banks for her opinion regarding the new finding on his ultrasound.   She advised me of the following: Any lesion >1 cm in cirrhosis is concerning for malignancy. He either needs an MRI or just gets a biopsy. Tell him if this is [facility], it is potentially curable at this stage if not metastatic. Once it is stage IV HCC has terrible prognosis. Thanks, Astrid Divine  It is ultimately up to him, but we need to document that we advised him of the concerning area, our plans for further assessment through imaging or Bx, and his decision to proceed or defer.   Thanks,  Gaspar Bidding

## 2018-03-20 NOTE — Telephone Encounter (Signed)
Called patient back today to inquire as to what he has decided regarding the liver lesions that were recently seen on [place].  Informed patient that this is a different area and not what he has known to be there as a result of his hepatitis.  Informed him that another oncologist has taken a look at the [place] and states anything greater than 1 cm may indicate malignancy but if it is treated before it reaches Stage 4 then it is treatable. Informed him that her recommendation would be either to have MRI or biopsy.  Patient is still reluctant to make a decision.  States he will think about it and call me back tomorrow.

## 2018-03-21 ENCOUNTER — Encounter: Payer: Self-pay | Admitting: Hematology and Oncology

## 2018-03-22 ENCOUNTER — Telehealth: Payer: Self-pay | Admitting: *Deleted

## 2018-03-22 NOTE — Telephone Encounter (Signed)
Patient called stating he wants to wait and talk to Dr. Mike Gip when she returns next week. He is still not sure about having the MRI that was recommended to further evaluate his liver lesion.  He wants to know why the PET did not show it.  He has questions that only Dr. Mike Gip can answer for him.

## 2018-03-23 ENCOUNTER — Telehealth: Payer: Self-pay | Admitting: *Deleted

## 2018-03-23 NOTE — Telephone Encounter (Signed)
Called patient back after he called and LVM that he wanted me to call him.  He states he saw his PCP today (Dr. Cletis Athens) at [facility]. He would like [place] to send a copy of his [place] that showed the liver lesions to his PCP.  He also is still undecided as to whether he wants to proceed with MRI or biopsy. He wants to see Dr. Mike Gip when she returns.

## 2018-03-24 ENCOUNTER — Telehealth: Payer: Self-pay | Admitting: *Deleted

## 2018-03-24 NOTE — Telephone Encounter (Signed)
Patient called to inquire about MRI order.  I told him the order is in and the insurance company has approved it.  We are awaiting his decision as to whether or not he will proceed with it.  Per his request I have sent scheduling a message to schedule him with MD the week after she returns to work.

## 2018-03-27 ENCOUNTER — Other Ambulatory Visit: Payer: Self-pay

## 2018-03-27 ENCOUNTER — Ambulatory Visit: Payer: Medicare PPO | Attending: Nurse Practitioner | Admitting: Nurse Practitioner

## 2018-03-27 ENCOUNTER — Encounter: Payer: Self-pay | Admitting: Nurse Practitioner

## 2018-03-27 VITALS — BP 115/80 | HR 89 | Temp 97.5°F | Ht 72.0 in | Wt 194.0 lb

## 2018-03-27 DIAGNOSIS — Z8719 Personal history of other diseases of the digestive system: Secondary | ICD-10-CM | POA: Insufficient documentation

## 2018-03-27 DIAGNOSIS — G8929 Other chronic pain: Secondary | ICD-10-CM | POA: Diagnosis not present

## 2018-03-27 DIAGNOSIS — M5412 Radiculopathy, cervical region: Secondary | ICD-10-CM | POA: Insufficient documentation

## 2018-03-27 DIAGNOSIS — M654 Radial styloid tenosynovitis [de Quervain]: Secondary | ICD-10-CM | POA: Diagnosis not present

## 2018-03-27 DIAGNOSIS — M47812 Spondylosis without myelopathy or radiculopathy, cervical region: Secondary | ICD-10-CM

## 2018-03-27 DIAGNOSIS — K860 Alcohol-induced chronic pancreatitis: Secondary | ICD-10-CM | POA: Insufficient documentation

## 2018-03-27 DIAGNOSIS — M48061 Spinal stenosis, lumbar region without neurogenic claudication: Secondary | ICD-10-CM | POA: Diagnosis not present

## 2018-03-27 DIAGNOSIS — G40909 Epilepsy, unspecified, not intractable, without status epilepticus: Secondary | ICD-10-CM | POA: Diagnosis not present

## 2018-03-27 DIAGNOSIS — M79645 Pain in left finger(s): Secondary | ICD-10-CM | POA: Insufficient documentation

## 2018-03-27 DIAGNOSIS — M79605 Pain in left leg: Secondary | ICD-10-CM | POA: Insufficient documentation

## 2018-03-27 DIAGNOSIS — G5603 Carpal tunnel syndrome, bilateral upper limbs: Secondary | ICD-10-CM | POA: Diagnosis not present

## 2018-03-27 DIAGNOSIS — M7918 Myalgia, other site: Secondary | ICD-10-CM

## 2018-03-27 DIAGNOSIS — M47816 Spondylosis without myelopathy or radiculopathy, lumbar region: Secondary | ICD-10-CM

## 2018-03-27 DIAGNOSIS — D496 Neoplasm of unspecified behavior of brain: Secondary | ICD-10-CM | POA: Insufficient documentation

## 2018-03-27 DIAGNOSIS — R161 Splenomegaly, not elsewhere classified: Secondary | ICD-10-CM | POA: Diagnosis not present

## 2018-03-27 DIAGNOSIS — Z87891 Personal history of nicotine dependence: Secondary | ICD-10-CM | POA: Insufficient documentation

## 2018-03-27 DIAGNOSIS — M545 Low back pain: Secondary | ICD-10-CM | POA: Insufficient documentation

## 2018-03-27 DIAGNOSIS — G4733 Obstructive sleep apnea (adult) (pediatric): Secondary | ICD-10-CM | POA: Diagnosis not present

## 2018-03-27 DIAGNOSIS — J449 Chronic obstructive pulmonary disease, unspecified: Secondary | ICD-10-CM | POA: Insufficient documentation

## 2018-03-27 DIAGNOSIS — F1021 Alcohol dependence, in remission: Secondary | ICD-10-CM | POA: Insufficient documentation

## 2018-03-27 DIAGNOSIS — I10 Essential (primary) hypertension: Secondary | ICD-10-CM | POA: Insufficient documentation

## 2018-03-27 DIAGNOSIS — M79601 Pain in right arm: Secondary | ICD-10-CM | POA: Insufficient documentation

## 2018-03-27 DIAGNOSIS — K7460 Unspecified cirrhosis of liver: Secondary | ICD-10-CM | POA: Diagnosis not present

## 2018-03-27 DIAGNOSIS — Z79899 Other long term (current) drug therapy: Secondary | ICD-10-CM | POA: Insufficient documentation

## 2018-03-27 DIAGNOSIS — K5909 Other constipation: Secondary | ICD-10-CM | POA: Insufficient documentation

## 2018-03-27 DIAGNOSIS — M25512 Pain in left shoulder: Secondary | ICD-10-CM | POA: Insufficient documentation

## 2018-03-27 DIAGNOSIS — S0990XA Unspecified injury of head, initial encounter: Secondary | ICD-10-CM | POA: Diagnosis not present

## 2018-03-27 DIAGNOSIS — M5416 Radiculopathy, lumbar region: Secondary | ICD-10-CM | POA: Diagnosis not present

## 2018-03-27 DIAGNOSIS — M4802 Spinal stenosis, cervical region: Secondary | ICD-10-CM | POA: Diagnosis not present

## 2018-03-27 DIAGNOSIS — F329 Major depressive disorder, single episode, unspecified: Secondary | ICD-10-CM | POA: Insufficient documentation

## 2018-03-27 DIAGNOSIS — G894 Chronic pain syndrome: Secondary | ICD-10-CM

## 2018-03-27 DIAGNOSIS — G629 Polyneuropathy, unspecified: Secondary | ICD-10-CM | POA: Diagnosis not present

## 2018-03-27 DIAGNOSIS — R262 Difficulty in walking, not elsewhere classified: Secondary | ICD-10-CM | POA: Diagnosis not present

## 2018-03-27 DIAGNOSIS — M4727 Other spondylosis with radiculopathy, lumbosacral region: Secondary | ICD-10-CM | POA: Insufficient documentation

## 2018-03-27 DIAGNOSIS — D696 Thrombocytopenia, unspecified: Secondary | ICD-10-CM | POA: Diagnosis not present

## 2018-03-27 DIAGNOSIS — K219 Gastro-esophageal reflux disease without esophagitis: Secondary | ICD-10-CM | POA: Diagnosis not present

## 2018-03-27 DIAGNOSIS — M79602 Pain in left arm: Secondary | ICD-10-CM | POA: Insufficient documentation

## 2018-03-27 DIAGNOSIS — M25511 Pain in right shoulder: Secondary | ICD-10-CM | POA: Insufficient documentation

## 2018-03-27 DIAGNOSIS — B1920 Unspecified viral hepatitis C without hepatic coma: Secondary | ICD-10-CM | POA: Insufficient documentation

## 2018-03-27 DIAGNOSIS — Z85118 Personal history of other malignant neoplasm of bronchus and lung: Secondary | ICD-10-CM | POA: Insufficient documentation

## 2018-03-27 DIAGNOSIS — M79604 Pain in right leg: Secondary | ICD-10-CM | POA: Insufficient documentation

## 2018-03-27 MED ORDER — OXYCODONE HCL 5 MG PO TABS: 2.5000 mg | ORAL_TABLET | Freq: Two times a day (BID) | ORAL | 0 refills | Status: DC

## 2018-03-27 MED ORDER — GABAPENTIN 600 MG PO TABS: 600.0000 mg | ORAL_TABLET | Freq: Three times a day (TID) | ORAL | 0 refills | Status: DC

## 2018-03-27 MED ORDER — BACLOFEN 10 MG PO TABS: 10.0000 mg | ORAL_TABLET | Freq: Three times a day (TID) | ORAL | 0 refills | Status: DC

## 2018-03-27 NOTE — Progress Notes (Signed)
Nursing Pain Medication Assessment:  Safety precautions to be maintained throughout the outpatient stay will include: orient to surroundings, keep bed in low position, maintain call bell within reach at all times, provide assistance with transfer out of bed and ambulation.  Medication Inspection Compliance: Pill count conducted under aseptic conditions, in front of the patient. Neither the pills nor the bottle was removed from the patient's sight at any time. Once count was completed pills were immediately returned to the patient in their original bottle.  Medication: Oxycodone IR Pill/Patch Count: 26 1/2 of 30 pills remain Pill/Patch Appearance: Markings consistent with prescribed medication Bottle Appearance: Standard pharmacy container. Clearly labeled. Filled Date: 7/17 / 2018 Last Medication intake:  Today

## 2018-03-27 NOTE — Patient Instructions (Signed)
____________________________________________________________________________________________  Medication Rules  Applies to: All patients receiving prescriptions (written or electronic).  Pharmacy of record: Pharmacy where electronic prescriptions will be sent. If written prescriptions are taken to a different pharmacy, please inform the nursing staff. The pharmacy listed in the electronic medical record should be the one where you would like electronic prescriptions to be sent.  Prescription refills: Only during scheduled appointments. Applies to both, written and electronic prescriptions.  NOTE: The following applies primarily to controlled substances (Opioid* Pain Medications).   Patient's responsibilities: 1. Pain Pills: Bring all pain pills to every appointment (except for procedure appointments). 2. Pill Bottles: Bring pills in original pharmacy bottle. Always bring newest bottle. Bring bottle, even if empty. 3. Medication refills: You are responsible for knowing and keeping track of what medications you need refilled. The day before your appointment, write a list of all prescriptions that need to be refilled. Bring that list to your appointment and give it to the admitting nurse. Prescriptions will be written only during appointments. If you forget a medication, it will not be "Called in", "Faxed", or "electronically sent". You will need to get another appointment to get these prescribed. 4. Prescription Accuracy: You are responsible for carefully inspecting your prescriptions before leaving our office. Have the discharge nurse carefully go over each prescription with you, before taking them home. Make sure that your name is accurately spelled, that your address is correct. Check the name and dose of your medication to make sure it is accurate. Check the number of pills, and the written instructions to make sure they are clear and accurate. Make sure that you are given enough medication to last  until your next medication refill appointment. 5. Taking Medication: Take medication as prescribed. Never take more pills than instructed. Never take medication more frequently than prescribed. Taking less pills or less frequently is permitted and encouraged, when it comes to controlled substances (written prescriptions).  6. Inform other Doctors: Always inform, all of your healthcare providers, of all the medications you take. 7. Pain Medication from other Providers: You are not allowed to accept any additional pain medication from any other Doctor or Healthcare provider. There are two exceptions to this rule. (see below) In the event that you require additional pain medication, you are responsible for notifying us, as stated below. 8. Medication Agreement: You are responsible for carefully reading and following our Medication Agreement. This must be signed before receiving any prescriptions from our practice. Safely store a copy of your signed Agreement. Violations to the Agreement will result in no further prescriptions. (Additional copies of our Medication Agreement are available upon request.) 9. Laws, Rules, & Regulations: All patients are expected to follow all Federal and State Laws, Statutes, Rules, & Regulations. Ignorance of the Laws does not constitute a valid excuse. The use of any illegal substances is prohibited. 10. Adopted CDC guidelines & recommendations: Target dosing levels will be at or below 60 MME/day. Use of benzodiazepines** is not recommended.  Exceptions: There are only two exceptions to the rule of not receiving pain medications from other Healthcare Providers. 1. Exception #1 (Emergencies): In the event of an emergency (i.e.: accident requiring emergency care), you are allowed to receive additional pain medication. However, you are responsible for: As soon as you are able, call our office [phone], at any time of the day or night, and leave a message stating your name, the  date and nature of the emergency, and the name and dose of the medication   prescribed. In the event that your call is answered by a member of our staff, make sure to document and save the date, time, and the name of the person that took your information.  2. Exception #2 (Planned Surgery): In the event that you are scheduled by another doctor or dentist to have any type of surgery or procedure, you are allowed (for a period no longer than 30 days), to receive additional pain medication, for the acute post-op pain. However, in this case, you are responsible for picking up a copy of our "Post-op Pain Management for Surgeons" handout, and giving it to your surgeon or dentist. This document is available at our office, and does not require an appointment to obtain it. Simply go to our office during business hours (Monday-Thursday from 8:00 AM to 4:00 PM) (Friday 8:00 AM to 12:00 Noon) or if you have a scheduled appointment with us, prior to your surgery, and ask for it by name. In addition, you will need to provide us with your name, name of your surgeon, type of surgery, and date of procedure or surgery.  *Opioid medications include: morphine, codeine, oxycodone, oxymorphone, hydrocodone, hydromorphone, meperidine, tramadol, tapentadol, buprenorphine, fentanyl, methadone. **Benzodiazepine medications include: diazepam (Valium), alprazolam (Xanax), clonazepam (Klonopine), lorazepam (Ativan), clorazepate (Tranxene), chlordiazepoxide (Librium), estazolam (Prosom), oxazepam (Serax), temazepam (Restoril), triazolam (Halcion) (Last updated: 11/03/2017) ____________________________________________________________________________________________    

## 2018-03-27 NOTE — Progress Notes (Signed)
Patient's Name: John [name]  MRN: [id number]  Referring Provider: Cletis Athens, MD  DOB: 04-21-1956  PCP: Cletis Athens, MD  DOS: 03/27/2018  Note by: Vevelyn Francois NP  Service setting: Ambulatory outpatient  Specialty: Interventional Pain Management  Location: [facility] (AMB) Pain Management Facility    Patient type: Established    Primary Reason(s) for Visit: Encounter for prescription drug management. (Level of risk: moderate)  CC: Back Pain (lower)  HPI  Mr. Mehaffey is a 62 y.o. year old, male patient, who comes today for a medication management evaluation. He has Encounter for therapeutic drug level monitoring; Long term current use of opiate analgesic; Opiate use (7.5 MME/Day); Lumbar spondylosis; Chronic low back pain (Location of Primary Source of Pain) (Bilateral) (L>R); IVP/radiological dye Allergy; Thrombocytopenia ([place]); History of alcoholism; Carpal tunnel syndrome; Depression; Difficulty in walking; Dystonia; Gastric ulcer; HCV (hepatitis C virus); H/O neoplasm; BP (high blood pressure); Decreased leukocytes; Hepatic cirrhosis ([place]); Loss of feeling or sensation; Absence of sensation; Current tobacco use; Has a tremor; Osteoarthritis of spine with radiculopathy, lumbosacral region; Peripheral neuropathy (Alcoholic); Chronic neck pain (Location of Tertiary source of pain) (Bilateral) (R>L); Cervical spondylosis; Obstructive sleep apnea; History of panic attacks; GERD (gastroesophageal reflux disease); Chronic constipation; 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[place]); Leukopenia; Lumbar facet syndrome (Location of Primary Source of Pain) (Bilateral) (L>R); Chronic lumbar radicular pain (Right S1 and Left L5) (Location of Secondary source of pain) (Bilateral) (L>R); Cervical radicular pain (Location of Tertiary source of pain) (Bilateral) (R>L); Chronic obstructive pulmonary disease (COPD) ([place]); Head injury; Epileptic disorder ([place]); Long term prescription opiate use; Encounter for chronic pain  management; Radicular pain of shoulder (Bilateral) (R>L); Opioid-induced constipation ([place]); Personal history of tobacco use, presenting hazards to health; Neurogenic pain; Musculoskeletal pain; Chronic lower extremity pain (Location of Secondary source of pain) (Bilateral) (L>R); Chronic upper extremity pain (Location of Tertiary source of pain) (Bilateral) (R>L); Abnormal MRI, lumbar spine (2015); Lumbar central spinal stenosis (Severe at L4-5; L5-S1); Lumbar lateral recess stenosis (Bilateral L>R at L4-5; Left sided at L5-S1); Lumbar foraminal stenosis (Left L5-S1); Lumbar facet hypertrophy (L4-5 & L5-S1 L>R); Abnormal MRI, cervical spine (2014); Cervical foraminal stenosis (Right sided C3-4 & C4-5) (Left-sided C5-6 and C7-T1) (Bilateral C6-7); Splenomegaly; Chronic pain syndrome; Chronic thumb pain (Left); Bilateral carpal tunnel syndrome; Foot pain, right; History of brain tumor; De Quervain's tenosynovitis, left; Nodule of upper lobe of left lung; Goals of care, counseling/discussion; and Back pain on their problem list. His primarily concern today is the Back Pain (lower)  Pain Assessment: Location: Left, Right, Lower Back Radiating: Pain radiates down both legs to thigh and down to foot Onset: More than a month ago Duration: Chronic pain Quality: Aching, Throbbing Severity: 3 /10 (subjective, self-reported pain score)  Note: Reported level is compatible with observation.                          Effect on ADL: slows me down Timing: Constant Modifying factors: sit down or lay down, medications BP: 115/80  HR: 89  Mr. Chapa was last scheduled for an appointment on 12/28/2017 for medication management. During today's appointment we reviewed Mr. Mash chronic pain status, as well as his outpatient medication regimen. He has completed radiation for lung cancer.  He states that he has a area of his liver. He is concern because he has not spoken with the oncologist. He is going to have an MRI. He  admits that he has not been sleeping  secondary to anxiety. He has not taken any of the alprazolam. He admits that his back pain is worse in the morning. He admits that the pain is okay when he is up moving. He admits that the pain does return during the evening hours. He admits that he was told not to use [employer] in the past. He was told to use an occasional APAP. He is not taking anything OTC.   The patient  reports that he does not use drugs. His body mass index is 26.31 kg/m.  Further details on both, my assessment(s), as well as the proposed treatment plan, please see below.  Controlled Substance Pharmacotherapy Assessment REMS (Risk Evaluation and Mitigation Strategy)   Analgesic:Oxycodone 2.5 one every 12 hours (5 mg/day of oxycodone) MME/day:7.5 mg/day   [name], RN  03/27/2018  9:53 AM  Sign at close encounter Nursing Pain Medication Assessment:  Safety precautions to be maintained throughout the outpatient stay will include: orient to surroundings, keep bed in low position, maintain call bell within reach at all times, provide assistance with transfer out of bed and ambulation.  Medication Inspection Compliance: Pill count conducted under aseptic conditions, in front of the patient. Neither the pills nor the bottle was removed from the patient's sight at any time. Once count was completed pills were immediately returned to the patient in their original bottle.  Medication: Oxycodone IR Pill/Patch Count: 26 1/2 of 30 pills remain Pill/Patch Appearance: Markings consistent with prescribed medication Bottle Appearance: Standard pharmacy container. Clearly labeled. Filled Date: 7/17 / 2018 Last Medication intake:  Today   Pharmacokinetics: Liberation and absorption (onset of action): WNL Distribution (time to peak effect): WNL Metabolism and excretion (duration of action): WNL         Pharmacodynamics: Desired effects: Analgesia: Mr. Frix reports >50% benefit. Functional  ability: Patient reports that medication allows him to accomplish basic ADLs Clinically meaningful improvement in function (CMIF): Sustained CMIF goals met Perceived effectiveness: Described as relatively effective, allowing for increase in activities of daily living (ADL) Undesirable effects: Side-effects or Adverse reactions: None reported Monitoring: [place] PMP: Online review of the past 28-monthperiod conducted. Compliant with practice rules and regulations Last UDS on record: Summary  Date Value Ref Range Status  12/28/2017 FINAL  Final    Comment:    ==================================================================== TOXASSURE SELECT 13 (MW) ==================================================================== Test                             Result       Flag       Units Drug Present and Declared for Prescription Verification   Oxycodone                      405          EXPECTED   ng/mg creat   Noroxycodone                   690          EXPECTED   ng/mg creat    Sources of oxycodone include scheduled prescription medications.    Noroxycodone is an expected metabolite of oxycodone. Drug Absent but Declared for Prescription Verification   Alprazolam                     Not Detected UNEXPECTED ng/mg creat ==================================================================== Test  Result    Flag   Units      Ref Range   Creatinine              20               mg/dL      >=20 ==================================================================== Declared Medications:  The flagging and interpretation on this report are based on the  following declared medications.  Unexpected results may arise from  inaccuracies in the declared medications.  **Note: The testing scope of this panel includes these medications:  Alprazolam  Oxycodone  **Note: The testing scope of this panel does not include following  reported medications:  Baclofen  Cetirizine  Folic acid   Gabapentin  Magnesium Oxide ==================================================================== For clinical consultation, please call [phone]. ====================================================================    UDS interpretation: Compliant          Medication Assessment Form: Reviewed. Patient indicates being compliant with therapy Treatment compliance: Compliant Risk Assessment Profile: Aberrant behavior: See prior evaluations. None observed or detected today Comorbid factors increasing risk of overdose: See prior notes. No additional risks detected today Risk of substance use disorder (SUD): Low Opioid Risk Tool - 03/27/18 0949      Family History of Substance Abuse   Alcohol  Positive Male    Illegal Drugs  Positive Male    Rx Drugs  Negative      Personal History of Substance Abuse   Alcohol  Positive Male or Male    Illegal Drugs  Negative    Rx Drugs  Negative      History of Preadolescent Sexual Abuse   History of Preadolescent Sexual Abuse  Negative or Male      Psychological Disease   Psychological Disease  Negative    Depression  Negative      Total Score   Opioid Risk Tool Scoring  9    Opioid Risk Interpretation  High Risk      ORT Scoring interpretation table:  Score <3 = Low Risk for SUD  Score between 4-7 = Moderate Risk for SUD  Score >8 = High Risk for Opioid Abuse   Risk Mitigation Strategies:  Patient Counseling: Covered Patient-Prescriber Agreement (PPA): Present and active  Notification to other healthcare providers: Done  Pharmacologic Plan: No change in therapy, at this time.             Laboratory Chemistry  Inflammation Markers (CRP: Acute Phase) (ESR: Chronic Phase) Lab Results  Component Value Date   CRP 0.5 09/22/2015   ESRSEDRATE 15 09/22/2015                         Rheumatology Markers No results found for: RF, ANA, LABURIC, URICUR, LYMEIGGIGMAB, LYMEABIGMQN, HLAB27                      Renal Function  Markers Lab Results  Component Value Date   BUN 11 03/02/2018   CREATININE 0.80 03/02/2018   GFRAA >60 03/02/2018   GFRNONAA >60 03/02/2018                             Hepatic Function Markers Lab Results  Component Value Date   AST 23 03/02/2018   ALT 17 03/02/2018   ALBUMIN 4.5 03/02/2018   ALKPHOS 67 03/02/2018   HCVAB <0.1 11/06/2015  Electrolytes Lab Results  Component Value Date   NA 139 03/02/2018   K 4.2 03/02/2018   CL 103 03/02/2018   CALCIUM 9.3 03/02/2018   MG 1.9 09/22/2015                        Neuropathy Markers Lab Results  Component Value Date   JOINOMVE72 094 08/08/2015   FOLATE 68.8 08/08/2015                        Bone Pathology Markers No results found for: VD25OH, BS962EZ6OQH, UT6546TK3, TW6568LE7, 25OHVITD1, 25OHVITD2, 25OHVITD3, TESTOFREE, TESTOSTERONE                       Coagulation Parameters Lab Results  Component Value Date   INR 1.02 05/19/2017   LABPROT 13.3 05/19/2017   APTT 33 08/08/2015   PLT 76 (L) 03/02/2018                        Cardiovascular Markers Lab Results  Component Value Date   CKTOTAL 45 04/17/2012   CKMB 0.8 04/17/2012   TROPONINI < 0.02 04/17/2012   HGB 13.1 03/02/2018   HCT 37.9 (L) 03/02/2018                         CA Markers No results found for: CEA, CA125, LABCA2                      Note: Lab results reviewed.  Recent Diagnostic Imaging Results  US Abdomen Complete CLINICAL DATA:  Cirrhosis.  Additional history of lung cancer.  EXAM: ABDOMEN ULTRASOUND COMPLETE  COMPARISON:  Abdomen ultrasound dated 05/25/2017.  FINDINGS: Gallbladder: Multiple gallstones. No gallbladder wall thickening, pericholecystic fluid or other signs of acute cholecystitis. Largest gallstone measures 8 mm.  Common bile duct: Diameter: 6 mm  Liver: Liver echotexture is coarsened throughout and there are nodular peripheral contours indicating underlying cirrhosis. Possible hypoechoic  mass along the periphery of the RIGHT liver lobe, measuring 2.3 cm.  Portal vein is patent on color Doppler imaging with normal direction of blood flow towards the liver.  IVC: No abnormality visualized.  Pancreas: Visualized portion unremarkable.  Spleen: Mild splenomegaly.  Right Kidney: Length: 11.3 cm. Echogenicity within normal limits. No mass or hydronephrosis visualized.  Left Kidney: Length: 10.9 cm. Echogenicity within normal limits. No mass or hydronephrosis visualized.  Abdominal aorta: No aneurysm visualized.  Other findings: None.  IMPRESSION: 1. Possible mass within the RIGHT liver lobe, measuring 2.3 cm. Given the underlying cirrhosis, recommend further characterization with hepatic MRI to exclude neoplastic lesion. 2. Cirrhotic liver. 3. Mild splenomegaly. 4. Cholelithiasis without evidence of acute cholecystitis.  These results will be called to the ordering clinician or representative by the Radiologist Assistant, and communication documented in the PACS or zVision Dashboard.  Electronically Signed   By: Franki Cabot M.D.   On: 03/17/2018 13:48  Complexity Note: Imaging results reviewed. Results shared with Mr. Pember, using Layman's terms.                         Meds   Current Outpatient Medications:  .  ALPRAZolam (XANAX) 0.25 MG tablet, Take 0.25 mg by mouth at bedtime as needed for anxiety., Disp: , Rfl:  .  [START ON 04/21/2018] baclofen (LIORESAL) 10 MG tablet, Take  1 tablet (10 mg total) by mouth 3 (three) times daily., Disp: 270 tablet, Rfl: 0 .  cetirizine (ZYRTEC) 10 MG tablet, Take 10 mg by mouth daily., Disp: , Rfl: 3 .  folic acid (FOLVITE) 1 MG tablet, Take 1 mg by mouth daily. , Disp: , Rfl:  .  [START ON 04/21/2018] gabapentin (NEURONTIN) 600 MG tablet, Take 1 tablet (600 mg total) by mouth 3 (three) times daily., Disp: 270 tablet, Rfl: 0 .  [START ON 06/20/2018] oxyCODONE (OXY IR/ROXICODONE) 5 MG immediate release tablet, Take 0.5  tablets (2.5 mg total) by mouth 2 (two) times daily., Disp: 30 tablet, Rfl: 0 .  Magnesium Oxide 500 MG CAPS, Take 1 capsule (500 mg total) by mouth 2 (two) times daily at 8 am and 10 pm., Disp: 180 capsule, Rfl: 0 .  [START ON 05/21/2018] oxyCODONE (OXY IR/ROXICODONE) 5 MG immediate release tablet, Take 0.5 tablets (2.5 mg total) by mouth 2 (two) times daily., Disp: 30 tablet, Rfl: 0 .  [START ON 04/21/2018] oxyCODONE (OXY IR/ROXICODONE) 5 MG immediate release tablet, Take 0.5 tablets (2.5 mg total) by mouth 2 (two) times daily., Disp: 30 tablet, Rfl: 0  ROS  Constitutional: Denies any fever or chills Gastrointestinal: No reported hemesis, hematochezia, vomiting, or acute GI distress Musculoskeletal: Denies any acute onset joint swelling, redness, loss of ROM, or weakness Neurological: No reported episodes of acute onset apraxia, aphasia, dysarthria, agnosia, amnesia, paralysis, loss of coordination, or loss of consciousness  Allergies  Mr. Niehoff is allergic to codeine; codeine sulfate; morphine; and duloxetine.  [place]  Drug: Mr. Maddy  reports that he does not use drugs. Alcohol:  reports that he does not drink alcohol. Tobacco:  reports that he has quit smoking. His smoking use included cigarettes. He started smoking about 5 months ago. He has a 17.50 pack-year smoking history. He has never used smokeless tobacco. 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[place]) (06/23/2015), COPD (chronic obstructive pulmonary disease) ([place]), De Quervain's tenosynovitis, left (07/07/2017), Depression, Dystonia, Epileptic disorder ([place]) (06/23/2015), Extremity pain (05/06/2014), Febrile seizures ([place]), Foot pain (09/11/2014), Gastric ulcer (06/23/2015), Gout, H/O neoplasm (06/23/2015), Head injury (06/23/2015), Hemorrhoids, Hepatic cirrhosis ([place])  (06/23/2015), Hepatitis B, History of brain tumor (07/07/2017), History of GI bleed, Hypertension, Leukopenia, Neurogenic pain (01/21/2016), Nodule of upper lobe of left lung (12/02/2017), Obstructive sleep apnea (06/23/2015), Osteoarthritis of spine with radiculopathy, lumbar region (06/23/2015), Osteoarthritis of spine with radiculopathy, lumbosacral region (06/23/2015), Peripheral neuropathy (06/23/2015), Peripheral neuropathy (Alcoholic) (32/44/0102), Personal history of tobacco use, presenting hazards to health (01/09/2016), Pulse irregularity, Seizures ([place]), Spinal stenosis, Splenomegaly (05/30/2013), Thrombocytopenia ([place]) (72/53/6644), and Uncomplicated opioid dependence ([place]) (06/23/2015). Surgical: Mr. Prochnow  has a past surgical history that includes surgery for cervical neck fracture; Esophagogastroduodenoscopy (11/2013); and Tooth Extraction (Right, 09/28/2016). Family: family history includes Cancer in his father; Cancer (age of onset: 40) in his brother; Heart disease in his father; Stroke in his father and mother.  Constitutional Exam  General appearance: Well nourished, well developed, and well hydrated. In no apparent acute distress Vitals:   03/27/18 0937  BP: 115/80  Pulse: 89  Temp: (!) 97.5 F (36.4 C)  SpO2: 97%  Weight: 194 lb (88 kg)  Height: 6' (1.829 m)   BMI Assessment: Estimated body mass index is 26.31 kg/m as calculated from the following:   Height as of this encounter: 6' (1.829 m).  Weight as of this encounter: 194 lb (88 kg). Psych/Mental status: Alert, oriented x 3 (person, place, & time)       Eyes: PERLA Respiratory: No evidence of acute respiratory distress   Thoracic Spine Area Exam  Skin & Axial Inspection: No masses, redness, or swelling Alignment: Symmetrical Functional ROM: Unrestricted ROM Stability: No instability detected Muscle Tone/Strength: Functionally intact. No obvious neuro-muscular anomalies detected. Sensory (Neurological):  Unimpaired Muscle strength & Tone: No palpable anomalies  Lumbar Spine Area Exam  Skin & Axial Inspection: No masses, redness, or swelling Alignment: Symmetrical Functional ROM: Unrestricted ROM       Stability: No instability detected Muscle Tone/Strength: Functionally intact. No obvious neuro-muscular anomalies detected. Sensory (Neurological): Unimpaired Palpation: Complains of area being tender to palpation       Provocative Tests: Lumbar Hyperextension/rotation test: deferred today       Lumbar quadrant test (Kemp's test): deferred today       Lumbar Lateral bending test: deferred today       Patrick's Maneuver: deferred today                   FABER test: deferred today                   Thigh-thrust test: deferred today       S-I compression test: deferred today       S-I distraction test: deferred today        Gait & Posture Assessment  Ambulation: Unassisted Gait: Relatively normal for age and body habitus Posture: WNL   Lower Extremity Exam    Side: Right lower extremity  Side: Left lower extremity  Stability: No instability observed          Stability: No instability observed          Skin & Extremity Inspection: Skin color, temperature, and hair growth are WNL. No peripheral edema or cyanosis. No masses, redness, swelling, asymmetry, or associated skin lesions. No contractures.  Skin & Extremity Inspection: Skin color, temperature, and hair growth are WNL. No peripheral edema or cyanosis. No masses, redness, swelling, asymmetry, or associated skin lesions. No contractures.  Functional ROM: Unrestricted ROM                  Functional ROM: Unrestricted ROM                  Muscle Tone/Strength: Functionally intact. No obvious neuro-muscular anomalies detected.  Muscle Tone/Strength: Functionally intact. No obvious neuro-muscular anomalies detected.  Sensory (Neurological): Unimpaired  Sensory (Neurological): Unimpaired  Palpation: No palpable anomalies  Palpation: No  palpable anomalies   Assessment  Primary Diagnosis & Pertinent Problem List: The primary encounter diagnosis was Lumbar spondylosis. Diagnoses of Chronic lumbar radicular pain (Right S1 and Left L5) (Location of Secondary source of pain) (Bilateral) (L>R), Musculoskeletal pain, Cervical spondylosis, and Chronic pain syndrome were also pertinent to this visit.  Status Diagnosis  Controlled Controlled Controlled 1. Lumbar spondylosis   2. Chronic lumbar radicular pain (Right S1 and Left L5) (Location of Secondary source of pain) (Bilateral) (L>R)   3. Musculoskeletal pain   4. Cervical spondylosis   5. Chronic pain syndrome     Problems updated and reviewed during this visit: Problem  Depression  Back Pain   Plan of Care  Pharmacotherapy (Medications Ordered): Meds ordered this encounter  Medications  . oxyCODONE (OXY IR/ROXICODONE) 5 MG immediate release tablet    Sig: Take 0.5 tablets (2.5 mg total) by  mouth 2 (two) times daily.    Dispense:  30 tablet    Refill:  0    Do not place this medication, or any other prescription from our practice, on "Automatic Refill". Patient may have prescription filled one day early if pharmacy is closed on scheduled refill date. Do not fill until:06/20/2018 To last until:07/20/2018    Order Specific Question:   Supervising Provider    Answer:   Milinda Pointer [phone]  . baclofen (LIORESAL) 10 MG tablet    Sig: Take 1 tablet (10 mg total) by mouth 3 (three) times daily.    Dispense:  270 tablet    Refill:  0    Do not place this medication, or any other prescription from our practice, on "Automatic Refill". Patient may have prescription filled one day early if pharmacy is closed on scheduled refill date.    Order Specific Question:   Supervising Provider    Answer:   Milinda Pointer [phone]  . gabapentin (NEURONTIN) 600 MG tablet    Sig: Take 1 tablet (600 mg total) by mouth 3 (three) times daily.    Dispense:  270 tablet    Refill:   0    Order Specific Question:   Supervising Provider    Answer:   Milinda Pointer [phone]  . oxyCODONE (OXY IR/ROXICODONE) 5 MG immediate release tablet    Sig: Take 0.5 tablets (2.5 mg total) by mouth 2 (two) times daily.    Dispense:  30 tablet    Refill:  0    Do not place this medication, or any other prescription from our practice, on "Automatic Refill". Patient may have prescription filled one day early if pharmacy is closed on scheduled refill date. Do not fill until:05/21/2018 To last until: 06/20/2018    Order Specific Question:   Supervising Provider    Answer:   Milinda Pointer [phone]  . oxyCODONE (OXY IR/ROXICODONE) 5 MG immediate release tablet    Sig: Take 0.5 tablets (2.5 mg total) by mouth 2 (two) times daily.    Dispense:  30 tablet    Refill:  0    Do not place this medication, or any other prescription from our practice, on "Automatic Refill". Patient may have prescription filled one day early if pharmacy is closed on scheduled refill date. Do not fill until: 04/21/2018 To last until: 05/21/2018    Order Specific Question:   Supervising Provider    Answer:   Milinda Pointer [phone]   New Prescriptions   No medications on file   Medications administered today: Jaquavion Mccannon. Mccarry had no medications administered during this visit. Lab-work, procedure(s), and/or referral(s): No orders of the defined types were placed in this encounter.  Imaging and/or referral(s): None  Interventional therapies: Planned, scheduled, and/or pending: Not at this time.   Considering:  Diagnostic left L5-S1 lumbar epidural steroid injection  Diagnostic bilateral L4-5 transforaminal epidural steroid injection  Diagnostic bilateral lumbar facet block  Diagnostic right-sided cervical epidural steroid injection    Palliative PRN treatment(s):  Not at this time.   Provider-requested follow-up: Return in about 4 months (around 07/12/2018) for MedMgmt with Me Donella Stade  Edison Pace).  Future Appointments  Date Time Provider [place]  04/04/2018 11:15 AM Lequita Asal, MD [facility] None  05/29/2018  9:00 AM [facility]  06/01/2018 10:15 AM [facility] None  06/01/2018 10:45 AM Lequita Asal, MD [facility] None  06/05/2018  9:00 AM Noreene Filbert, MD [facility] None  07/10/2018  9:00  AM Vevelyn Francois, NP [facility] None   Primary Care Physician: Cletis Athens, MD Location: [facility] Outpatient Pain Management Facility Note by: Vevelyn Francois NP Date: 03/27/2018; Time: 11:30 AM  Pain Score Disclaimer: We use the NRS-11 scale. This is a self-reported, subjective measurement of pain severity with only modest accuracy. It is used primarily to identify changes within a particular patient. It must be understood that outpatient pain scales are significantly less accurate that those used for research, where they can be applied under ideal controlled circumstances with minimal exposure to variables. In reality, the score is likely to be a combination of pain intensity and pain affect, where pain affect describes the degree of emotional arousal or changes in action readiness caused by the sensory experience of pain. Factors such as social and work situation, setting, emotional state, anxiety levels, expectation, and prior pain experience may influence pain perception and show large inter-individual differences that may also be affected by time variables.  Patient instructions provided during this appointment: Patient Instructions  ____________________________________________________________________________________________  Medication Rules  Applies to: All patients receiving prescriptions (written or electronic).  Pharmacy of record: Pharmacy where electronic prescriptions will be sent. If written prescriptions are taken to a different pharmacy, please inform the nursing staff. The pharmacy listed in the electronic medical record  should be the one where you would like electronic prescriptions to be sent.  Prescription refills: Only during scheduled appointments. Applies to both, written and electronic prescriptions.  NOTE: The following applies primarily to controlled substances (Opioid* Pain Medications).   Patient's responsibilities: 1. Pain Pills: Bring all pain pills to every appointment (except for procedure appointments). 2. Pill Bottles: Bring pills in original pharmacy bottle. Always bring newest bottle. Bring bottle, even if empty. 3. Medication refills: You are responsible for knowing and keeping track of what medications you need refilled. The day before your appointment, write a list of all prescriptions that need to be refilled. Bring that list to your appointment and give it to the admitting nurse. Prescriptions will be written only during appointments. If you forget a medication, it will not be "Called in", "Faxed", or "electronically sent". You will need to get another appointment to get these prescribed. 4. Prescription Accuracy: You are responsible for carefully inspecting your prescriptions before leaving our office. Have the discharge nurse carefully go over each prescription with you, before taking them home. Make sure that your name is accurately spelled, that your address is correct. Check the name and dose of your medication to make sure it is accurate. Check the number of pills, and the written instructions to make sure they are clear and accurate. Make sure that you are given enough medication to last until your next medication refill appointment. 5. Taking Medication: Take medication as prescribed. Never take more pills than instructed. Never take medication more frequently than prescribed. Taking less pills or less frequently is permitted and encouraged, when it comes to controlled substances (written prescriptions).  6. Inform other Doctors: Always inform, all of your healthcare providers, of all the  medications you take. 7. Pain Medication from other Providers: You are not allowed to accept any additional pain medication from any other Doctor or Healthcare provider. There are two exceptions to this rule. (see below) In the event that you require additional pain medication, you are responsible for notifying us, as stated below. 8. Medication Agreement: You are responsible for carefully reading and following our Medication Agreement. This must be signed before receiving any prescriptions from our  practice. Safely store a copy of your signed Agreement. Violations to the Agreement will result in no further prescriptions. (Additional copies of our Medication Agreement are available upon request.) 9. Laws, Rules, & Regulations: All patients are expected to follow all Federal and [employer], [employer], Rules, [employer]. Ignorance of the Laws does not constitute a valid excuse. The use of any illegal substances is prohibited. 10. Adopted CDC guidelines & recommendations: Target dosing levels will be at or below 60 MME/day. Use of benzodiazepines** is not recommended.  Exceptions: There are only two exceptions to the rule of not receiving pain medications from other Healthcare Providers. 1. Exception #1 (Emergencies): In the event of an emergency (i.e.: accident requiring emergency care), you are allowed to receive additional pain medication. However, you are responsible for: As soon as you are able, call our office [phone], at any time of the day or night, and leave a message stating your name, the date and nature of the emergency, and the name and dose of the medication prescribed. In the event that your call is answered by a member of our staff, make sure to document and save the date, time, and the name of the person that took your information.  2. Exception #2 (Planned Surgery): In the event that you are scheduled by another doctor or dentist to have any type of surgery or procedure, you are  allowed (for a period no longer than 30 days), to receive additional pain medication, for the acute post-op pain. However, in this case, you are responsible for picking up a copy of our "Post-op Pain Management for Surgeons" handout, and giving it to your surgeon or dentist. This document is available at our office, and does not require an appointment to obtain it. Simply go to our office during business hours (Monday-Thursday from 8:00 AM to 4:00 PM) (Friday 8:00 AM to 12:00 Noon) or if you have a scheduled appointment with [place], prior to your surgery, and ask for it by name. In addition, you will need to provide [place] with your name, name of your surgeon, type of surgery, and date of procedure or surgery.  *Opioid medications include: morphine, codeine, oxycodone, oxymorphone, hydrocodone, hydromorphone, meperidine, tramadol, tapentadol, buprenorphine, fentanyl, methadone. **Benzodiazepine medications include: diazepam (Valium), alprazolam (Xanax), clonazepam (Klonopine), lorazepam (Ativan), clorazepate (Tranxene), chlordiazepoxide (Librium), estazolam (Prosom), oxazepam (Serax), temazepam (Restoril), triazolam (Halcion) (Last updated: 11/03/2017) ____________________________________________________________________________________________

## 2018-03-29 ENCOUNTER — Telehealth: Payer: Self-pay | Admitting: *Deleted

## 2018-03-29 NOTE — Telephone Encounter (Signed)
Patient called and stated he wants to proceed with his MRI.  He also wants to talk with Dr. Mike Gip about this situation but said he has decided it is best to go ahead and look into it.

## 2018-04-04 ENCOUNTER — Other Ambulatory Visit: Payer: Self-pay

## 2018-04-04 ENCOUNTER — Inpatient Hospital Stay: Payer: Medicare PPO | Attending: Hematology and Oncology | Admitting: Hematology and Oncology

## 2018-04-04 VITALS — BP 120/70 | HR 91 | Temp 95.8°F | Resp 18 | Wt 197.1 lb

## 2018-04-04 DIAGNOSIS — K7460 Unspecified cirrhosis of liver: Secondary | ICD-10-CM | POA: Diagnosis not present

## 2018-04-04 DIAGNOSIS — R160 Hepatomegaly, not elsewhere classified: Secondary | ICD-10-CM | POA: Insufficient documentation

## 2018-04-04 NOTE — Progress Notes (Signed)
[facility] day:  04/04/18   Chief Complaint: John [name] is a 62 y.o. 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  HPI:  The patient was last seen in the medical oncology clinic on 03/02/2018.  At that time, he was seen for 1 month assessment following completion of SBRT to the left apical lung nodule. Symptomatically, he was doing well. He denied B symptoms. He denied  any bruising or bleeding.  He noted some upper chest pain that Dr. Baruch Gouty felt was likely related to recent therapy. Exam was stable.   Abdominal ultrasound on 03/17/2018 revealed a possible 2.3 cm mass in the right liver lobe.  Liver was cirrhotic.  There was mild splenomegaly.  There was cholelithiasis without cholecystitis.  Liver MRI was scheduled.  He declined imaging.  Symptomatically, patient continues to have pain in his upper chest related to his recent radiation therapy treatments. He presents today concerned about recent ultrasound findings suggesting a new hepatic mass. Patient states, "It is that same place that has been there, or it is scar tissue. I had that negative PET scan earlier this year. I don't think it is anything".   Patient denies acute symptoms today. Patient denies that he has experienced any B symptoms. He denies any interval infections. Patient denies bleeding; no hematochezia, melena, or gross hematuria. Patient advises that he maintains an adequate appetite. He is eating well. Weight today is 197 lb 1.6 oz (89.4 kg), which compared to his last visit to the clinic, represents a 3 pound increase.   Patient complaints of pain rated 4/10 in the clinic today.   Past Medical History:  Diagnosis Date  . Abnormal MRI, lumbar spine (2015) 01/21/2016   FINDINGS:   L3-4: Tiny right foraminal and extra foraminal disc bulge adjacent to but not compressing the L3 nerve lateral to the neural foramen. L4-5: There is a small broad-based disc bulge. There is also hypertrophy of the ligamentum flavum and facet joints, left more than right creating moderately severe spinal stenosis and bilateral lateral recess stenosis, left greater than right. This appe  . Anxiety   . Back ache 05/06/2014  . BP (high blood pressure) 06/23/2015  . Cervical pain 05/06/2014  . 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[place]) 06/23/2015  . COPD (chronic obstructive pulmonary disease) ([place])   . De Quervain's tenosynovitis, left 07/07/2017  . Depression   . Dystonia   . Epileptic disorder ([place]) 06/23/2015  . Extremity pain 05/06/2014  . Febrile seizures ([place])    child  . Foot pain 09/11/2014  . Gastric ulcer 06/23/2015  . Gout   . H/O neoplasm 06/23/2015  . Head injury 06/23/2015  . Hemorrhoids   . Hepatic cirrhosis ([place]) 06/23/2015  . Hepatitis B   . History of brain tumor 07/07/2017  . History of GI bleed   . Hypertension   . Leukopenia   . Neurogenic pain 01/21/2016  . Nodule of upper lobe of left lung 12/02/2017  . Obstructive sleep apnea 06/23/2015  . 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 Osteoarthritis of spine with radiculopathy, lumbosacral region 06/23/2015  . Peripheral neuropathy 06/23/2015  . Peripheral neuropathy (Alcoh[place]) 35/36/1443  . Personal history of tobacco use, presenting hazards to health 01/09/2016  . Pulse irregularity   . Seizures ([place])   .  Spinal stenosis   . Splenomegaly 05/30/2013  . Thrombocytopenia ([place]) 06/23/2015  . Uncomplicated opioid dependence ([place]) 06/23/2015    Past Surgical History:  Procedure Laterality Date  . ESOPHAGOGASTRODUODENOSCOPY  11/2013  . surgery for cervical neck fracture    . TOOTH EXTRACTION Right 09/28/2016    Family History  Problem Relation Age of Onset  . Stroke Mother   . Cancer Father    . Heart disease Father   . Stroke Father   . Cancer Brother 28       Lung Cancer    Social History:  reports that he has quit smoking. His smoking use included cigarettes. He started smoking about 5 months ago. He has a 17.50 pack-year smoking history. He has never used smokeless tobacco. He reports that he does not drink alcohol or use drugs.  He has not smoked in 20 days.  The patient lives in [place].  The patient is alone today.  Allergies:  Allergies  Allergen Reactions  . Codeine Nausea Only  . Codeine Sulfate Nausea Only  . Morphine Nausea Only  . Duloxetine Rash    Made me sick    Current Medications: Current Outpatient Medications  Medication Sig Dispense Refill  . [START ON 04/21/2018] baclofen (LIORESAL) 10 MG tablet Take 1 tablet (10 mg total) by mouth 3 (three) times daily. 270 tablet 0  . cetirizine (ZYRTEC) 10 MG tablet Take 10 mg by mouth daily.  3  . folic acid (FOLVITE) 1 MG tablet Take 1 mg by mouth daily.     Derrill Memo ON 04/21/2018] gabapentin (NEURONTIN) 600 MG tablet Take 1 tablet (600 mg total) by mouth 3 (three) times daily. 270 tablet 0  . [START ON 04/21/2018] oxyCODONE (OXY IR/ROXICODONE) 5 MG immediate release tablet Take 0.5 tablets (2.5 mg total) by mouth 2 (two) times daily. 30 tablet 0  . ALPRAZolam (XANAX) 0.25 MG tablet Take 0.25 mg by mouth at bedtime as needed for anxiety.    . Magnesium Oxide 500 MG CAPS Take 1 capsule (500 mg total) by mouth 2 (two) times daily at 8 am and 10 pm. (Patient not taking: Reported on 04/04/2018) 180 capsule 0   No current facility-administered medications for this visit.     Review of Systems  Constitutional: Positive for malaise/fatigue. Negative for diaphoresis, fever and weight loss (3 pound increase).  HENT: Negative.   Eyes: Negative.   Respiratory: Negative for cough, hemoptysis, sputum production and shortness of breath.        Upper chest wall pain related to recent XRT  Cardiovascular: Negative for chest  pain, palpitations, orthopnea, leg swelling and PND.  Gastrointestinal: Positive for constipation (occassional). Negative for abdominal pain, blood in stool, diarrhea, melena, nausea and vomiting.  Genitourinary: Negative for dysuria, frequency, hematuria and urgency.  Musculoskeletal: Negative for back pain (chronic lower back), falls, joint pain and myalgias.  Skin: Negative for itching and rash.  Neurological: Positive for tremors. Negative for dizziness, weakness and headaches.  Endo/Heme/Allergies: Does not bruise/bleed easily.  Psychiatric/Behavioral: Negative for depression, memory loss and suicidal ideas. The patient is not nervous/anxious and does not have insomnia.   All other systems reviewed and are negative.  Performance status (ECOG): 1 - Symptomatic but completely ambulatory  Vital Signs BP 120/70 (BP Location: Left Arm, Patient Position: Sitting)   Pulse 91   Temp (!) 95.8 F (35.4 C) (Tympanic)   Resp 18   Wt 197 lb 1.6 oz (89.4 kg)   BMI 26.73 kg/m  Physical Exam  Constitutional: He is oriented to person, place, and time and well-developed, well-nourished, and in no distress.  HENT:  Head: Normocephalic and atraumatic.  Dark hair  Eyes: Right eye exhibits no discharge. Left eye exhibits no discharge. No scleral icterus.  Hazel eyes  Neurological: He is alert and oriented to person, place, and time.  Psychiatric: Mood, affect and judgment normal.  Nursing note and vitals reviewed.   Imaging studies: 11/13/2015:  RUQ ultrasound revealed stable cirrhotic changes within the liver.  There were no solid appearing hepatic mass.   09/17/2016:  RUQ ultrasound revealed cirrhotic changes within the liver and no focal solid hepatic mass.   05/25/2017:  Abdominal ultrasound revealed a cirrhotic liver without a focal hepatic mass.  There was mild splenomegaly.  There was abdominal aortic ectasia. 11/01/2017:  Chest CT nodule follow-up revealed Lung-RADS 4B, suspicious.  Interval growth of irregular solid apical left upper lobe pulmonary nodule measuring 9.0 mm in volume derived mean diameter, suspicious for primary bronchogenic carcinoma. 11/24/2017:  PET scan revealed a hypermetabolic 1.0 cm left apical pulmonary nodule favoring malignancy. Assuming non-small cell lung cancer this represents T1a N0 M0 disease (stage IA). 03/17/2018:  Abdominal ultrasound  revealed a possible 2.3 cm mass in the right liver lobe.  Liver was cirrhotic.  There was mild splenomegaly.  There was cholelithiasis without cholecystitis.    No visits with results within 3 Day(s) from this visit.  Latest known visit with results is:  Appointment on 03/02/2018  Component Date Value Ref Range Status  . Sodium 03/02/2018 139  135 - 145 mmol/L Final  . Potassium 03/02/2018 4.2  3.5 - 5.1 mmol/L Final  . Chloride 03/02/2018 103  98 - 111 mmol/L Final   Please note change in reference range.  . CO2 03/02/2018 28  22 - 32 mmol/L Final  . Glucose, Bld 03/02/2018 87  70 - 99 mg/dL Final   Please note change in reference range.  . BUN 03/02/2018 11  8 - 23 mg/dL Final   Please note change in reference range.  . Creatinine, Ser 03/02/2018 0.80  0.61 - 1.24 mg/dL Final  . Calcium 03/02/2018 9.3  8.9 - 10.3 mg/dL Final  . Total Protein 03/02/2018 7.7  6.5 - 8.1 g/dL Final  . Albumin 03/02/2018 4.5  3.5 - 5.0 g/dL Final  . AST 03/02/2018 23  15 - 41 U/L Final  . ALT 03/02/2018 17  0 - 44 U/L Final   Please note change in reference range.  . Alkaline Phosphatase 03/02/2018 67  38 - 126 U/L Final  . Total Bilirubin 03/02/2018 0.8  0.3 - 1.2 mg/dL Final  . GFR calc non Af Amer 03/02/2018 >60  >60 mL/min Final  . GFR calc Af Amer 03/02/2018 >60  >60 mL/min Final   Comment: (NOTE) The eGFR has been calculated using the CKD EPI equation. This calculation has not been validated in all clinical situations. eGFR's persistently <60 mL/min signify possible Chronic Kidney Disease.   Georgiann Hahn gap  03/02/2018 8  5 - 15 Final   Performed at [facility], [address] [place], [place] [address]  . WBC 03/02/2018 3.0* 3.8 - 10.6 K/uL Final  . RBC 03/02/2018 3.99* 4.40 - 5.90 MIL/uL Final  . Hemoglobin 03/02/2018 13.1  13.0 - 18.0 g/dL Final  . HCT 03/02/2018 37.9* 40.0 - 52.0 % Final  . MCV 03/02/2018 95.0  80.0 - 100.0 fL Final  . MCH 03/02/2018 33.0  26.0 -  34.0 pg Final  . MCHC 03/02/2018 34.7  32.0 - 36.0 g/dL Final  . RDW 03/02/2018 14.6* 11.5 - 14.5 % Final  . Platelets 03/02/2018 76* 150 - 440 K/uL Final  . Neutrophils Relative % 03/02/2018 60  % Final  . Neutro Abs 03/02/2018 1.8  1.4 - 6.5 K/uL Final  . Lymphocytes Relative 03/02/2018 28  % Final  . Lymphs Abs 03/02/2018 0.8* 1.0 - 3.6 K/uL Final  . Monocytes Relative 03/02/2018 8  % Final  . Monocytes Absolute 03/02/2018 0.2  0.2 - 1.0 K/uL Final  . Eosinophils Relative 03/02/2018 3  % Final  . Eosinophils Absolute 03/02/2018 0.1  0 - 0.7 K/uL Final  . Basophils Relative 03/02/2018 1  % Final  . Basophils Absolute 03/02/2018 0.0  0 - 0.1 K/uL Final   Performed at [facility], [address], [place], [place] [address]  . AFP, Serum, Tumor Marker 03/02/2018 7.4  0.0 - 8.3 ng/mL Final   Comment: (NOTE) Roche [employer]ostics Electrochemiluminescence Immunoassay (ECLIA) Values obtained with different assay methods or kits cannot be used interchangeably.  Results cannot be interpreted as absolute evidence of the presence or absence of malignant disease. This test is not interpretable in pregnant females. Performed At: [facility] [place], [place] [id number] Rush Farmer MD [id number] Performed at [facility], [address] [place], [place] [address]     Assessment:  John [name] is a 62 y.o. male with chronic thrombocytopenia since 2013.  He is hepatitis B positive.  He has cirrhosis.  Platelet count has ranged between 55,000 and 72,000 from 11/09/2012 until  10/28/2014. He previously drank a significant amount of alcohol beginning in his teens until 03/23/2012.  Diet is modest.  He denies any new medications or herbal products.  Work-up in 2014 revealed the following normal labs:  [place], B12, Hepatitis B surface antigen, hepatitis C, and HIV testing. ANA was positive with RNP of 1.6 on 12/21/2012. Work-up on 08/08/2015 revealed a normal PTT, B12, folate, liver function tests, and creatinine.  Hepatitis B core antibody total was positive on 08/08/2015.  AFP has been followed:  6.4 on 03/20/2012, 4.7 on 11/09/2012, 3.2 on 12/13/2013, 3.6 on 11/06/2015, 3.1 on 09/14/2016, 3.5 on 05/19/2017, and 7.4 on 03/02/2018.  Patient has had serial abdominal ultrasounds.  Spleen was 13 cm on 12/26/2012, 14.6 cm on 05/30/2013, and 11.2 cm on 12/11/2013. Abdominal ultrasound revealed splenic volume of 506 cm3 on 12/11/2013 and 718.8 cm3 on 12/23/2014.  Abdominal ultrasound on 05/25/2017 revealed a cirrhotic liver without a focal hepatic mass.  There was mild splenomegaly.  There was abdominal aortic ectasia.  Abdominal ultrasound on 03/17/2018 revealed a possible 2.3 cm mass in the right liver lobe.  Liver was cirrhotic.  There was mild splenomegaly.  There was cholelithiasis without cholecystitis.  PET scan on 11/24/2017 revealed a hypermetabolic 1.0 cm left apical pulmonary nodule favoring malignancy. Assuming non-small cell lung cancer this represents T1a N0 M0 disease (stage IA).  PFTs on 11/29/2017 revealed severe obstruction with probable bronchodilator effect. FEV1 was 1.62 liter (49% predicted) and post FEV1 was 1.91 liters (57%).  DLCO was 60% and DLCO/VA was 88%.  He is felt to be a poor surgical candidate.  He received SBRT from 01/17/2018 - 02/01/2018.  Symptomatically, he is doing well.  He has intermittent chest wall pain.  He denies any RUQ symptoms.  Exam is stable.    Plan: 1. Review interval labs.  Counts and liver function  studies stable.  AFP is  normal. 2. Discuss interval abdominal ultrasound.  Ultrasound personally reviewed and images showed to patient.  There is a 2.3 cm lesion in the right lobe of the liver.  Etiology is unclear.  AFP is normal, but above baseline. Discuss MRI of the liver for further clarification.  PET scan not helpful given false negative scans.  Discuss management of [place] if diagnosis is suggested by liver MRI.  3. Patient agrees to proceeding with liver MRI. Scan is scheduled for 04/11/2018. 4. Add patient to tumor board on 04/13/2018. 5. RTC after MRI and tumor board for MD assessment and review of plans.   A total of (15) minutes of face-to-face time was spent with the patient with greater than 50% of that time in counseling and care-coordination.    Honor Loh, NP 04/04/2018, 4:37 PM   I saw and evaluated the patient, participating in the key portions of the service and reviewing pertinent diagnostic studies and records.  I reviewed the nurse practitioner's note and agree with the findings and the plan.  The assessment and plan were discussed with the patient.  Several questions were asked by the patient and answered.   Lequita Asal, MD  04/04/2018, 4:37 PM

## 2018-04-04 NOTE — Progress Notes (Signed)
Here for follow up.  Stated " I need to know about this spot on my liver " stated overall feels" ok I guess "

## 2018-04-05 ENCOUNTER — Encounter: Payer: Self-pay | Admitting: Hematology and Oncology

## 2018-04-11 ENCOUNTER — Ambulatory Visit: Payer: Medicare PPO

## 2018-04-19 ENCOUNTER — Ambulatory Visit
Admission: RE | Admit: 2018-04-19 | Discharge: 2018-04-19 | Disposition: A | Discharge status: Home / Self Care | Payer: Medicare PPO | Source: Ambulatory Visit | Attending: Urgent Care | Admitting: Urgent Care

## 2018-04-19 DIAGNOSIS — K7460 Unspecified cirrhosis of liver: Secondary | ICD-10-CM

## 2018-04-19 DIAGNOSIS — K8020 Calculus of gallbladder without cholecystitis without obstruction: Secondary | ICD-10-CM | POA: Insufficient documentation

## 2018-04-19 DIAGNOSIS — R160 Hepatomegaly, not elsewhere classified: Secondary | ICD-10-CM | POA: Diagnosis present

## 2018-04-19 MED ORDER — GADOBENATE DIMEGLUMINE 529 MG/ML IV SOLN
20.0000 mL | Freq: Once | INTRAVENOUS | Status: AC | PRN
  Administered 2018-04-19: 18 mL via INTRAVENOUS

## 2018-04-25 ENCOUNTER — Encounter: Payer: Self-pay | Admitting: Hematology and Oncology

## 2018-04-25 ENCOUNTER — Other Ambulatory Visit: Payer: Self-pay

## 2018-04-25 ENCOUNTER — Inpatient Hospital Stay: Payer: Medicare PPO | Attending: Hematology and Oncology | Admitting: Hematology and Oncology

## 2018-04-25 VITALS — BP 120/75 | HR 96 | Temp 98.0°F | Resp 18 | Wt 195.6 lb

## 2018-04-25 DIAGNOSIS — R911 Solitary pulmonary nodule: Secondary | ICD-10-CM | POA: Diagnosis not present

## 2018-04-25 DIAGNOSIS — D696 Thrombocytopenia, unspecified: Secondary | ICD-10-CM | POA: Diagnosis not present

## 2018-04-25 DIAGNOSIS — R0789 Other chest pain: Secondary | ICD-10-CM

## 2018-04-25 DIAGNOSIS — Z79899 Other long term (current) drug therapy: Secondary | ICD-10-CM | POA: Diagnosis not present

## 2018-04-25 DIAGNOSIS — K7460 Unspecified cirrhosis of liver: Secondary | ICD-10-CM | POA: Diagnosis not present

## 2018-04-25 DIAGNOSIS — I77811 Abdominal aortic ectasia: Secondary | ICD-10-CM

## 2018-04-25 DIAGNOSIS — K8020 Calculus of gallbladder without cholecystitis without obstruction: Secondary | ICD-10-CM

## 2018-04-25 DIAGNOSIS — Z87891 Personal history of nicotine dependence: Secondary | ICD-10-CM

## 2018-04-25 DIAGNOSIS — K769 Liver disease, unspecified: Secondary | ICD-10-CM

## 2018-04-25 DIAGNOSIS — R161 Splenomegaly, not elsewhere classified: Secondary | ICD-10-CM

## 2018-04-25 DIAGNOSIS — R160 Hepatomegaly, not elsewhere classified: Secondary | ICD-10-CM

## 2018-04-25 NOTE — Progress Notes (Signed)
[facility] day:  04/25/2018    Chief Complaint: John [name] is a 62 y.o. 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  HPI:  The patient was last seen in the medical oncology clinic on 04/04/2018.  At that time, he was doing well.  He had intermittent chest wall pain.  He denied any RUQ symptoms.  Exam was stable.  Ultrasound revealed a possible 2.3 cm mass in the right lobe of the liver.  We discussed plans for MRI.  Liver MRI on 04/19/2018 revealed a 2.5 cm lesion in the medial segment left liver with no central enhancement and irregular peripheral hypervascularity. Imaging features were highly suspicious for malignancy although not specific for hepatocellular carcinoma. Metastatic disease is also considered.  There was an 8 mm non-cystic lesion the anterior liver cannot be definitively characterized but was concerning for second neoplastic lesion.  There were hepatic cysts and cholelithiasis.  He is scheduled for chest CT on 05/29/2018.  During the interim, patient is fatigued following his radiation therapy. Patient continues to have upper chest wall pain. He states, "it only really hurts when I pick up something heavy, so I have just stopped picking up heavy things". He has continued lower back pain and constipation. Patient denies any significant abdominal pain or swelling.   Patient denies that he has experienced any B symptoms. He denies any interval infections. Patient advises that he maintains an adequate appetite. He is eating well. Weight today is 195 lb 9.6 oz (88.7 kg), which compared to his last visit to the clinic, represents a  2 pound decrease.   Patient denies pain in the clinic today.   Past Medical History:  Diagnosis Date  . Abnormal MRI, lumbar  spine (2015) 01/21/2016   FINDINGS:  L3-4: Tiny right foraminal and extra foraminal disc bulge adjacent to but not compressing the L3 nerve lateral to the neural foramen. L4-5: There is a small broad-based disc bulge. There is also hypertrophy of the ligamentum flavum and facet joints, left more than right creating moderately severe spinal stenosis and bilateral lateral recess stenosis, left greater than right. This appe  . Anxiety   . Back ache 05/06/2014  . BP (high blood pressure) 06/23/2015  . Cervical pain 05/06/2014  . 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[place]) 06/23/2015  . COPD (chronic obstructive pulmonary disease) ([place])   . De Quervain's tenosynovitis, left 07/07/2017  . Depression   . Dystonia   . Epileptic disorder ([place]) 06/23/2015  . Extremity pain 05/06/2014  . Febrile seizures ([place])    child  . Foot pain 09/11/2014  . Gastric ulcer 06/23/2015  . Gout   . H/O neoplasm 06/23/2015  . Head injury 06/23/2015  . Hemorrhoids   . Hepatic cirrhosis ([place]) 06/23/2015  . Hepatitis B   . History of brain tumor 07/07/2017  . History of GI bleed   . Hypertension   . Leukopenia   . Neurogenic pain 01/21/2016  . Nodule of upper lobe of left lung 12/02/2017  . Obstructive sleep apnea 06/23/2015  . 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 Osteoarthritis of spine with radiculopathy, lumbosacral region 06/23/2015  . Peripheral neuropathy 06/23/2015  . Peripheral neuropathy ([place]) 33/29/5188  . Personal history of tobacco use, presenting hazards to health 01/09/2016  .  Pulse irregularity   . Seizures ([place])   . Spinal stenosis   . Splenomegaly 05/30/2013  . Thrombocytopenia ([place]) 06/23/2015  . Uncomplicated opioid dependence ([place]) 06/23/2015    Past Surgical History:  Procedure Laterality Date  . ESOPHAGOGASTRODUODENOSCOPY  11/2013  . surgery for cervical neck fracture    . TOOTH EXTRACTION Right 09/28/2016    Family History  Problem Relation Age of Onset   . Stroke Mother   . Cancer Father   . Heart disease Father   . Stroke Father   . Cancer Brother 52       Lung Cancer    Social History:  reports that he has quit smoking. His smoking use included cigarettes. He started smoking about 6 months ago. He has a 17.50 pack-year smoking history. He has never used smokeless tobacco. He reports that he does not drink alcohol or use drugs.  He has not smoked in 20 days.  The patient lives in [place].  The patient is alone today.  Allergies:  Allergies  Allergen Reactions  . Codeine Nausea Only  . Codeine Sulfate Nausea Only  . Morphine Nausea Only  . Duloxetine Rash    Made me sick    Current Medications: Current Outpatient Medications  Medication Sig Dispense Refill  . baclofen (LIORESAL) 10 MG tablet Take 1 tablet (10 mg total) by mouth 3 (three) times daily. (Patient taking differently: Take 10 mg by mouth 2 (two) times daily. ) 270 tablet 0  . cetirizine (ZYRTEC) 10 MG tablet Take 10 mg by mouth daily.  3  . folic acid (FOLVITE) 1 MG tablet Take 1 mg by mouth daily.     Marland Kitchen gabapentin (NEURONTIN) 600 MG tablet Take 1 tablet (600 mg total) by mouth 3 (three) times daily. 270 tablet 0  . Magnesium Oxide 500 MG CAPS Take 1 capsule (500 mg total) by mouth 2 (two) times daily at 8 am and 10 pm. 180 capsule 0  . oxyCODONE (OXY IR/ROXICODONE) 5 MG immediate release tablet Take 0.5 tablets (2.5 mg total) by mouth 2 (two) times daily. 30 tablet 0  . ALPRAZolam (XANAX) 0.25 MG tablet Take 0.25 mg by mouth at bedtime as needed for anxiety.     No current facility-administered medications for this visit.     Review of Systems  Constitutional: Positive for malaise/fatigue and weight loss (down 2 pounds). Negative for diaphoresis and fever.       "I'm doing ok".   HENT: Negative.   Eyes: Negative.   Respiratory: Negative for cough, hemoptysis, sputum production and shortness of breath.   Cardiovascular: Negative for chest pain, palpitations,  orthopnea, leg swelling and PND.  Gastrointestinal: Positive for constipation (occassional). Negative for abdominal pain, blood in stool, diarrhea, melena, nausea and vomiting.  Genitourinary: Negative for dysuria, frequency, hematuria and urgency.  Musculoskeletal: Positive for back pain (chronic). Negative for falls, joint pain and myalgias.       Upper chest wall pain.   Skin: Negative for itching and rash.  Neurological: Positive for tremors. Negative for dizziness, weakness and headaches.  Endo/Heme/Allergies: Does not bruise/bleed easily.  Psychiatric/Behavioral: Negative for depression, memory loss and suicidal ideas. The patient is not nervous/anxious and does not have insomnia.   All other systems reviewed and are negative.  Performance status (ECOG): 1 - Symptomatic but completely ambulatory  Vital Signs BP 120/75 (BP Location: Left Arm, Patient Position: Sitting)   Pulse 96   Temp 98 F (36.7 C) (Tympanic)   Resp  18   Wt 195 lb 9.6 oz (88.7 kg)   BMI 26.53 kg/m   Physical Exam  Constitutional: He is oriented to person, place, and time and well-developed, well-nourished, and in no distress.  HENT:  Head: Normocephalic and atraumatic.  Dark hair with graying.  Goatee.  Eyes: Conjunctivae and EOM are normal. No scleral icterus.  Hazel eyes.   Neurological: He is alert and oriented to person, place, and time.  Psychiatric: Mood, affect and judgment normal.  Nursing note and vitals reviewed.   Imaging studies: 11/13/2015:  RUQ ultrasound revealed stable cirrhotic changes within the liver.  There were no solid appearing hepatic mass.   09/17/2016:  RUQ ultrasound revealed cirrhotic changes within the liver and no focal solid hepatic mass.   05/25/2017:  Abdominal ultrasound revealed a cirrhotic liver without a focal hepatic mass.  There was mild splenomegaly.  There was abdominal aortic ectasia. 11/01/2017:  Chest CT nodule follow-up revealed Lung-RADS 4B, suspicious.  Interval growth of irregular solid apical left upper lobe pulmonary nodule measuring 9.0 mm in volume derived mean diameter, suspicious for primary bronchogenic carcinoma. 11/24/2017:  PET scan revealed a hypermetabolic 1.0 cm left apical pulmonary nodule favoring malignancy. Assuming non-small cell lung cancer this represents T1a N0 M0 disease (stage IA). 03/17/2018:  Abdominal ultrasound  revealed a possible 2.3 cm mass in the right liver lobe.  Liver was cirrhotic.  There was mild splenomegaly.  There was cholelithiasis without cholecystitis.    No visits with results within 3 Day(s) from this visit.  Latest known visit with results is:  Appointment on 03/02/2018  Component Date Value Ref Range Status  . Sodium 03/02/2018 139  135 - 145 mmol/L Final  . Potassium 03/02/2018 4.2  3.5 - 5.1 mmol/L Final  . Chloride 03/02/2018 103  98 - 111 mmol/L Final   Please note change in reference range.  . CO2 03/02/2018 28  22 - 32 mmol/L Final  . Glucose, Bld 03/02/2018 87  70 - 99 mg/dL Final   Please note change in reference range.  . BUN 03/02/2018 11  8 - 23 mg/dL Final   Please note change in reference range.  . Creatinine, Ser 03/02/2018 0.80  0.61 - 1.24 mg/dL Final  . Calcium 03/02/2018 9.3  8.9 - 10.3 mg/dL Final  . Total Protein 03/02/2018 7.7  6.5 - 8.1 g/dL Final  . Albumin 03/02/2018 4.5  3.5 - 5.0 g/dL Final  . AST 03/02/2018 23  15 - 41 U/L Final  . ALT 03/02/2018 17  0 - 44 U/L Final   Please note change in reference range.  . Alkaline Phosphatase 03/02/2018 67  38 - 126 U/L Final  . Total Bilirubin 03/02/2018 0.8  0.3 - 1.2 mg/dL Final  . GFR calc non Af Amer 03/02/2018 >60  >60 mL/min Final  . GFR calc Af Amer 03/02/2018 >60  >60 mL/min Final   Comment: (NOTE) The eGFR has been calculated using the CKD EPI equation. This calculation has not been validated in all clinical situations. eGFR's persistently <60 mL/min signify possible Chronic Kidney Disease.   Georgiann Hahn gap  03/02/2018 8  5 - 15 Final   Performed at [facility], [address] [place],  [address]  . WBC 03/02/2018 3.0* 3.8 - 10.6 K/uL Final  . RBC 03/02/2018 3.99* 4.40 - 5.90 MIL/uL Final  . Hemoglobin 03/02/2018 13.1  13.0 - 18.0 g/dL Final  . HCT 03/02/2018 37.9* 40.0 - 52.0 % Final  .  MCV 03/02/2018 95.0  80.0 - 100.0 fL Final  . MCH 03/02/2018 33.0  26.0 - 34.0 pg Final  . MCHC 03/02/2018 34.7  32.0 - 36.0 g/dL Final  . RDW 03/02/2018 14.6* 11.5 - 14.5 % Final  . Platelets 03/02/2018 76* 150 - 440 K/uL Final  . Neutrophils Relative % 03/02/2018 60  % Final  . Neutro Abs 03/02/2018 1.8  1.4 - 6.5 K/uL Final  . Lymphocytes Relative 03/02/2018 28  % Final  . Lymphs Abs 03/02/2018 0.8* 1.0 - 3.6 K/uL Final  . Monocytes Relative 03/02/2018 8  % Final  . Monocytes Absolute 03/02/2018 0.2  0.2 - 1.0 K/uL Final  . Eosinophils Relative 03/02/2018 3  % Final  . Eosinophils Absolute 03/02/2018 0.1  0 - 0.7 K/uL Final  . Basophils Relative 03/02/2018 1  % Final  . Basophils Absolute 03/02/2018 0.0  0 - 0.1 K/uL Final   Performed at [facility], [address], [place], [place] [address]  . AFP, Serum, Tumor Marker 03/02/2018 7.4  0.0 - 8.3 ng/mL Final   Comment: (NOTE) Roche Diagnostics Electrochemiluminescence Immunoassay (ECLIA) Values obtained with different assay methods or kits cannot be used interchangeably.  Results cannot be interpreted as absolute evidence of the presence or absence of malignant disease. This test is not interpretable in pregnant females. Performed At: [facility] [place], [place] [id number] Rush Farmer MD LK:[id number] Performed at [facility], [address] [place], [place] [address]     Assessment:  RAEVON BROOM is a 62 y.o. male with chronic thrombocytopenia since 2013.  He is hepatitis B positive.  He has cirrhosis.  Platelet count has ranged between 55,000 and 72,000 from 11/09/2012 until  10/28/2014. He previously drank a significant amount of alcohol beginning in his teens until 03/23/2012.  Diet is modest.  He denies any new medications or herbal products.  Work-up in 2014 revealed the following normal labs:  [place], B12, Hepatitis B surface antigen, hepatitis C, and HIV testing. ANA was positive with RNP of 1.6 on 12/21/2012. Work-up on 08/08/2015 revealed a normal PTT, B12, folate, liver function tests, and creatinine.  Hepatitis B core antibody total was positive on 08/08/2015.  AFP has been followed:  6.4 on 03/20/2012, 4.7 on 11/09/2012, 3.2 on 12/13/2013, 3.6 on 11/06/2015, 3.1 on 09/14/2016, 3.5 on 05/19/2017, and 7.4 on 03/02/2018.  Patient has had serial abdominal ultrasounds.  Spleen was 13 cm on 12/26/2012, 14.6 cm on 05/30/2013, and 11.2 cm on 12/11/2013. Abdominal ultrasound revealed splenic volume of 506 cm3 on 12/11/2013 and 718.8 cm3 on 12/23/2014.  Abdominal ultrasound on 05/25/2017 revealed a cirrhotic liver without a focal hepatic mass.  There was mild splenomegaly.  There was abdominal aortic ectasia.  Abdominal ultrasound on 03/17/2018 revealed a possible 2.3 cm mass in the right liver lobe.  Liver was cirrhotic.  There was mild splenomegaly.  There was cholelithiasis without cholecystitis.  PET scan on 11/24/2017 revealed a hypermetabolic 1.0 cm left apical pulmonary nodule favoring malignancy. Assuming non-small cell lung cancer this represents T1a N0 M0 disease (stage IA).  PFTs on 11/29/2017 revealed severe obstruction with probable bronchodilator effect. FEV1 was 1.62 liter (49% predicted) and post FEV1 was 1.91 liters (57%).  DLCO was 60% and DLCO/VA was 88%.  He is felt to be a poor surgical candidate.  He received SBRT from 01/17/2018 - 02/01/2018.  Liver MRI on 04/19/2018 revealed a 2.5 cm lesion in the medial segment left liver with no central  enhancement and irregular peripheral hypervascularity. Imaging features were highly suspicious for malignancy  (hepatocellular carcinoma or metastatic disease).  There was an 8 mm non-cystic lesion the anterior liver cannot be definitively characterized but was concerning for second neoplastic lesion.  Symptomatically, patient is fatigued. He denies abdominal pain. He continues to have upper chest wall pain associated mainly with heavy lifting.  Exam is grossly stable.   Plan: 1. Liver mass:  Discuss interval abdominal MRI.  Imaging personally reviewed.  Imaging not definitive for hepatocellular carcinoma.    Differential includes metastatic disease.    Suspect etiology is unrelated to left apical pulmonary nodule s/p SBRT.  Discuss consideration of biopsy.  Procedure reviewed. 2. Present at tumor board. 3. Follow-up pending chest CT on 05/29/2018. 4. RTC based on tumor board discussions.  A total of (15) minutes of face-to-face time was spent with the patient with greater than 50% of that time in counseling and care-coordination.    Honor Loh, NP 04/25/2018, 11:30 AM   I saw and evaluated the patient, participating in the key portions of the service and reviewing pertinent diagnostic studies and records.  I reviewed the nurse practitioner's note and agree with the findings and the plan.  The assessment and plan were discussed with the patient.  Several questions were asked by the patient and answered.   Lequita Asal, MD  04/25/2018, 11:30 AM

## 2018-04-25 NOTE — Progress Notes (Signed)
Here for follow up " I feel ok " pt stated. ( relayed he falls asleep 12 mn -and awake by 2 am   Diff falling back to sleep he stated )  Encouraged to relay concerns to PCP.

## 2018-04-28 ENCOUNTER — Telehealth: Payer: Self-pay | Admitting: *Deleted

## 2018-04-28 ENCOUNTER — Other Ambulatory Visit: Payer: Self-pay | Admitting: Urgent Care

## 2018-04-28 DIAGNOSIS — K7030 Alcoholic cirrhosis of liver without ascites: Secondary | ICD-10-CM

## 2018-04-28 DIAGNOSIS — D696 Thrombocytopenia, unspecified: Secondary | ICD-10-CM

## 2018-04-28 DIAGNOSIS — K769 Liver disease, unspecified: Secondary | ICD-10-CM

## 2018-04-28 NOTE — Telephone Encounter (Signed)
Call patient to review tumor board discussions. Questions fielded. Patient aware that concern remains for [facility] due to size and morphological appearance of hepatic lesion.   Patient agrees to proceeding with the biopsy discussed. Will send paperwork to centralized scheduling today. Patient informed that he will be contacted with a date and time for the actual procedure. Verbalized understanding and consent.   In the interim, patient encouraged to return a call to the clinic with any questions or concerns.   Honor Loh, MSN, APRN, FNP-C, CEN Oncology/Hematology Nurse Practitioner  [facility] 04/28/18, 12:13 PM

## 2018-04-28 NOTE — Telephone Encounter (Signed)
   Please call patient.  CT guided liver biopsy discussed.  We will see him after the biopsy.  M

## 2018-04-28 NOTE — Telephone Encounter (Signed)
Please notify the scheduling pool once Biopsy has been ordered and scheduled and we will schedule pt for follow-up with your team.

## 2018-04-28 NOTE — Telephone Encounter (Signed)
Patient called and reports that someone was to call him after Case Conference yesterday, but no one called him and he wants a return call [phone]

## 2018-05-04 ENCOUNTER — Other Ambulatory Visit: Payer: Self-pay | Admitting: Radiology

## 2018-05-05 ENCOUNTER — Ambulatory Visit: Payer: Medicare PPO

## 2018-05-05 ENCOUNTER — Ambulatory Visit
Admission: RE | Admit: 2018-05-05 | Discharge: 2018-05-05 | Disposition: A | Discharge status: Home / Self Care | Payer: Medicare PPO | Source: Ambulatory Visit | Attending: Urgent Care | Admitting: Urgent Care

## 2018-05-05 DIAGNOSIS — M109 Gout, unspecified: Secondary | ICD-10-CM | POA: Diagnosis not present

## 2018-05-05 DIAGNOSIS — Z885 Allergy status to narcotic agent status: Secondary | ICD-10-CM | POA: Diagnosis not present

## 2018-05-05 DIAGNOSIS — Z8711 Personal history of peptic ulcer disease: Secondary | ICD-10-CM | POA: Insufficient documentation

## 2018-05-05 DIAGNOSIS — Z823 Family history of stroke: Secondary | ICD-10-CM | POA: Diagnosis not present

## 2018-05-05 DIAGNOSIS — K769 Liver disease, unspecified: Secondary | ICD-10-CM

## 2018-05-05 DIAGNOSIS — B1910 Unspecified viral hepatitis B without hepatic coma: Secondary | ICD-10-CM | POA: Diagnosis not present

## 2018-05-05 DIAGNOSIS — Z923 Personal history of irradiation: Secondary | ICD-10-CM | POA: Insufficient documentation

## 2018-05-05 DIAGNOSIS — Z8249 Family history of ischemic heart disease and other diseases of the circulatory system: Secondary | ICD-10-CM | POA: Diagnosis not present

## 2018-05-05 DIAGNOSIS — K860 Alcohol-induced chronic pancreatitis: Secondary | ICD-10-CM | POA: Insufficient documentation

## 2018-05-05 DIAGNOSIS — D696 Thrombocytopenia, unspecified: Secondary | ICD-10-CM | POA: Diagnosis not present

## 2018-05-05 DIAGNOSIS — Z8719 Personal history of other diseases of the digestive system: Secondary | ICD-10-CM | POA: Insufficient documentation

## 2018-05-05 DIAGNOSIS — Z87891 Personal history of nicotine dependence: Secondary | ICD-10-CM | POA: Diagnosis not present

## 2018-05-05 DIAGNOSIS — K7030 Alcoholic cirrhosis of liver without ascites: Secondary | ICD-10-CM | POA: Insufficient documentation

## 2018-05-05 DIAGNOSIS — J449 Chronic obstructive pulmonary disease, unspecified: Secondary | ICD-10-CM | POA: Diagnosis not present

## 2018-05-05 DIAGNOSIS — Z79899 Other long term (current) drug therapy: Secondary | ICD-10-CM | POA: Diagnosis not present

## 2018-05-05 DIAGNOSIS — R030 Elevated blood-pressure reading, without diagnosis of hypertension: Secondary | ICD-10-CM | POA: Insufficient documentation

## 2018-05-05 DIAGNOSIS — R0789 Other chest pain: Secondary | ICD-10-CM | POA: Insufficient documentation

## 2018-05-05 DIAGNOSIS — K7689 Other specified diseases of liver: Secondary | ICD-10-CM | POA: Insufficient documentation

## 2018-05-05 LAB — CBC
HEMATOCRIT: 38.6 % — AB (ref 40.0–52.0)
Hemoglobin: 13.1 g/dL (ref 13.0–18.0)
MCH: 32.2 pg (ref 26.0–34.0)
MCHC: 34 g/dL (ref 32.0–36.0)
MCV: 94.9 fL (ref 80.0–100.0)
Platelets: 81 10*3/uL — ABNORMAL LOW (ref 150–440)
RBC: 4.07 MIL/uL — AB (ref 4.40–5.90)
RDW: 14.3 % (ref 11.5–14.5)
WBC: 2.9 10*3/uL — ABNORMAL LOW (ref 3.8–10.6)

## 2018-05-05 LAB — PROTIME-INR
INR: 0.9
Prothrombin Time: 12.1 seconds (ref 11.4–15.2)

## 2018-05-05 MED ORDER — MIDAZOLAM HCL 5 MG/5ML IJ SOLN
INTRAMUSCULAR | Status: AC | PRN
  Administered 2018-05-05: 0.5 mg via INTRAVENOUS
  Administered 2018-05-05 (×2): 1 mg via INTRAVENOUS

## 2018-05-05 MED ORDER — FENTANYL CITRATE (PF) 100 MCG/2ML IJ SOLN
INTRAMUSCULAR | Status: AC
  Filled 2018-05-05: qty 4

## 2018-05-05 MED ORDER — SODIUM CHLORIDE 0.9 % IV SOLN
INTRAVENOUS | Status: DC
  Administered 2018-05-05: 11:00:00 via INTRAVENOUS

## 2018-05-05 MED ORDER — MIDAZOLAM HCL 5 MG/5ML IJ SOLN
INTRAMUSCULAR | Status: AC
  Filled 2018-05-05: qty 5

## 2018-05-05 MED ORDER — FENTANYL CITRATE (PF) 100 MCG/2ML IJ SOLN
INTRAMUSCULAR | Status: AC | PRN
  Administered 2018-05-05: 25 ug via INTRAVENOUS
  Administered 2018-05-05: 50 ug via INTRAVENOUS

## 2018-05-05 NOTE — Progress Notes (Signed)
Patient post liver biopsy per Dr Pascal Lux, vitals stable at this time. Friend present at bedside. Denies complaints at this time. Discharge instructions given with questions answered.

## 2018-05-05 NOTE — Procedures (Signed)
Pre Procedure Dx: Liver lesion Post Procedural Dx: Same  Technically successful US guided biopsy of dominant worrisome lesions within the left lobe of the liver.  EBL: None  No immediate complications.   Ronny Bacon, MD Pager #: [phone]

## 2018-05-05 NOTE — Discharge Instructions (Signed)
Moderate Conscious Sedation, Adult, Care After °These instructions provide you with information about caring for yourself after your procedure. Your health care provider may also give you more specific instructions. Your treatment has been planned according to current medical practices, but problems sometimes occur. Call your health care provider if you have any problems or questions after your procedure. °What can I expect after the procedure? °After your procedure, it is common: °· To feel sleepy for several hours. °· To feel clumsy and have poor balance for several hours. °· To have poor judgment for several hours. °· To vomit if you eat too soon. ° °Follow these instructions at home: °For at least 24 hours after the procedure: ° °· Do not: °? Participate in activities where you could fall or become injured. °? Drive. °? Use heavy machinery. °? Drink alcohol. °? Take sleeping pills or medicines that cause drowsiness. °? Make important decisions or sign legal documents. °? Take care of children on your own. °· Rest. °Eating and drinking °· Follow the diet recommended by your health care provider. °· If you vomit: °? Drink water, juice, or soup when you can drink without vomiting. °? Make sure you have little or no nausea before eating solid foods. °General instructions °· Have a responsible adult stay with you until you are awake and alert. °· Take over-the-counter and prescription medicines only as told by your health care provider. °· If you smoke, do not smoke without supervision. °· Keep all follow-up visits as told by your health care provider. This is important. °Contact a health care provider if: °· You keep feeling nauseous or you keep vomiting. °· You feel light-headed. °· You develop a rash. °· You have a fever. °Get help right away if: °· You have trouble breathing. °This information is not intended to replace advice given to you by your health care provider. Make sure you discuss any questions you have  with your health care provider. °Document Released: 06/13/2013 Document Revised: 01/26/2016 Document Reviewed: 12/13/2015 °Elsevier Interactive Patient Education © 2018 [employer] Inc. ° ° °Liver Biopsy, Care After °These instructions give you information on caring for yourself after your procedure. Your doctor may also give you more specific instructions. Call your doctor if you have any problems or questions after your procedure. °Follow these instructions at home: °· Rest at home for 1-2 days or as told by your doctor. °· Have someone stay with you for at least 24 hours. °· Do not do these things in the first 24 hours: °? Drive. °? Use machinery. °? Take care of other people. °? Sign legal documents. °? Take a bath or shower. °· There are many different ways to close and cover a cut (incision). For example, a cut can be closed with stitches, skin glue, or adhesive strips. Follow your doctor's instructions on: °? Taking care of your cut. °? Changing and removing your bandage (dressing). °? Removing whatever was used to close your cut. °· Do not drink alcohol in the first week. °· Do not lift more than 5 pounds or play contact sports for the first 2 weeks. °· Take medicines only as told by your doctor. For 1 week, do not take medicine that has aspirin in it or medicines like ibuprofen. °· Get your test results. °Contact a doctor if: °· A cut bleeds and leaves more than just a small spot of blood. °· A cut is red, puffs up (swells), or hurts more than before. °· Fluid or something else   comes from a cut. °· A cut smells bad. °· You have a fever or chills. °Get help right away if: °· You have swelling, bloating, or pain in your belly (abdomen). °· You get dizzy or faint. °· You have a rash. °· You feel sick to your stomach (nauseous) or throw up (vomit). °· You have trouble breathing, feel short of breath, or feel faint. °· Your chest hurts. °· You have problems talking or seeing. °· You have trouble balancing or moving  your arms or legs. °This information is not intended to replace advice given to you by your health care provider. Make sure you discuss any questions you have with your health care provider. °Document Released: 06/01/2008 Document Revised: 01/29/2016 Document Reviewed: 10/19/2013 °Elsevier Interactive Patient Education © 2018 Elsevier Inc. ° ° °

## 2018-05-05 NOTE — Consult Note (Signed)
Chief Complaint: Liver lesion  Referring Physician(s): Irving Burton  Patient Status: [facility] - Out-pt  History of Present Illness: John [name] is a 62 y.o. 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  The patient is accompanied by his wife though serves his own historian.  Patient reports chronic mild baseline shortness of breath.  He denies fever or cough.  He reports mild intermittent chest pain which she attributes to completing recent course of radiation therapy.  Patient currently denies chest pain.  No change in bowel or bladder functions.  No change in energy level.    Past Medical History:  Diagnosis Date  . Abnormal MRI, lumbar spine (2015) 01/21/2016   FINDINGS:  L3-4: Tiny right foraminal and extra foraminal disc bulge adjacent to but not compressing the L3 nerve lateral to the neural foramen. L4-5: There is a small broad-based disc bulge. There is also hypertrophy of the ligamentum flavum and facet joints, left more than right creating moderately severe spinal stenosis and bilateral lateral recess stenosis, left greater than right. This appe  . Anxiety   . Back ache 05/06/2014  . BP (high blood pressure) 06/23/2015  . Cervical pain 05/06/2014  . 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[place]) 06/23/2015  . COPD (chronic obstructive pulmonary disease) ([place])   . De Quervain's tenosynovitis, left 07/07/2017  . Depression   . Dystonia   . Epileptic disorder ([place]) 06/23/2015  . Extremity pain 05/06/2014  . Febrile seizures ([place])    child  . Foot pain 09/11/2014  . Gastric ulcer 06/23/2015  . Gout   . H/O neoplasm 06/23/2015  . Head injury 06/23/2015  . Hemorrhoids   . Hepatic cirrhosis ([place]) 06/23/2015  . Hepatitis B   . History of  brain tumor 07/07/2017  . History of GI bleed   . Hypertension   . Leukopenia   . Neurogenic pain 01/21/2016  . Nodule of upper lobe of left lung 12/02/2017  . Obstructive sleep apnea 06/23/2015  . 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 Osteoarthritis of spine with radiculopathy, lumbosacral region 06/23/2015  . Peripheral neuropathy 06/23/2015  . Peripheral neuropathy ([place]) 54/05/8118  . Personal history of tobacco use, presenting hazards to health 01/09/2016  . Pulse irregularity   . Seizures ([place])   . Spinal stenosis   . Splenomegaly 05/30/2013  . Thrombocytopenia ([place]) 06/23/2015  . Uncomplicated opioid dependence ([place]) 06/23/2015    Past Surgical History:  Procedure Laterality Date  . ESOPHAGOGASTRODUODENOSCOPY  11/2013  . surgery for cervical neck fracture    . TOOTH EXTRACTION Right 09/28/2016    Allergies: Codeine; Codeine sulfate; Morphine; and Duloxetine  Medications: Prior to Admission medications   Medication Sig Start Date End Date Taking? Authorizing Provider  ALPRAZolam Duanne Moron) 0.25 MG tablet Take 0.25 mg by mouth at bedtime as needed for anxiety.   Yes [provider]  baclofen (LIORESAL) 10 MG tablet Take 1 tablet (10 mg total) by mouth 3 (three) times daily. Patient taking differently: Take 10 mg by mouth 2 (two) times daily.  04/21/18 07/20/18 Yes Vevelyn Francois, NP  cetirizine (ZYRTEC) 10 MG tablet Take 10 mg by mouth daily. 11/09/17  Yes [provider]  folic acid (FOLVITE) 1 MG tablet Take 1 mg by mouth daily.    Yes [provider]  gabapentin (NEURONTIN) 600 MG tablet Take 1 tablet (600 mg total) by mouth 3 (three) times daily. 04/21/18 07/20/18 Yes Vevelyn Francois, NP  oxyCODONE (OXY IR/ROXICODONE) 5 MG immediate release tablet Take 0.5 tablets (2.5 mg total) by mouth 2 (two) times daily. 04/21/18 05/21/18 Yes Vevelyn Francois, NP  Magnesium Oxide 500 MG CAPS Take 1 capsule (500 mg total) by mouth 2  (two) times daily at 8 am and 10 pm. 04/19/17 04/25/18  Vevelyn Francois, NP     Family History  Problem Relation Age of Onset  . Stroke Mother   . Cancer Father   . Heart disease Father   . Stroke Father   . Cancer Brother 61       Lung Cancer    Social History   Socioeconomic History  . Marital status: Single    Spouse name: Not on file  . Number of children: Not on file  . Years of education: Not on file  . Highest education level: Not on file  Occupational History  . Not on file  Social Needs  . Financial resource strain: Not on file  . Food insecurity:    Worry: Not on file    Inability: Not on file  . Transportation needs:    Medical: Not on file    Non-medical: Not on file  Tobacco Use  . Smoking status: Former Smoker    Packs/day: 0.50    Years: 35.00    Pack years: 17.50    Types: Cigarettes    Start date: 10/07/2017  . Smokeless tobacco: Current User    Types: Snuff  Substance and Sexual Activity  . Alcohol use: No    Alcohol/week: 0.0 standard drinks    Comment: Pt states that he has been alcohol free since July 2013...  . Drug use: No  . Sexual activity: Not on file  Lifestyle  . Physical activity:    Days per week: Not on file    Minutes per session: Not on file  . Stress: Not on file  Relationships  . Social connections:    Talks on phone: Not on file    Gets together: Not on file    Attends religious service: Not on file    Active member of club or organization: Not on file    Attends meetings of clubs or organizations: Not on file    Relationship status: Not on file  Other Topics Concern  . Not on file  Social History Narrative  . Not on file    ECOG Status: 1 - Symptomatic but completely ambulatory  Review of Systems: A 12 point ROS discussed and pertinent positives are indicated in the HPI above.  All other systems are negative.  Review of Systems  Constitutional: Negative for activity change, appetite change, fatigue and fever.    Respiratory: Positive for shortness of breath.        Mild shortness of breath.  Cardiovascular: Positive for chest pain.       Patient reports intermittent mild chest pain which he attributes to completing recent course of radiation therapy.  He currently denies chest pain  Skin: Negative.   Psychiatric/Behavioral: Negative.     Vital Signs: BP 112/63   Pulse 60   Temp 98 F (36.7 C) (Oral)   Resp 18   Ht 6' (1.829 m)   Wt 88.5 kg   SpO2 96%   BMI 26.45 kg/m   Physical Exam  Constitutional: He appears well-developed  and well-nourished.  Cardiovascular: Normal rate and regular rhythm.  Pulmonary/Chest: Effort normal and breath sounds normal.  Nursing note and vitals reviewed.   Imaging: Mr Liver W Wo Contrast  Result Date: 04/19/2018 CLINICAL DATA:  Hepatic mass.  Cirrhosis.  Lung cancer. EXAM: MRI ABDOMEN WITHOUT AND WITH CONTRAST TECHNIQUE: Multiplanar multisequence MR imaging of the abdomen was performed both before and after the administration of intravenous contrast. CONTRAST:  8mL MULTIHANCE GADOBENATE DIMEGLUMINE 529 MG/ML IV SOLN COMPARISON:  Ultrasound 03/17/2018 FINDINGS: Lower chest: Unremarkable Hepatobiliary: Lesion in question from recent ultrasound is in the medial segment left liver by MRI, measuring 2.5 x 2.5 cm. This shows subtle increased signal intensity on precontrast T1 weighted imaging with irregular peripheral enhancement after IV contrast administration. No definite central enhancement in this lesion. Perfusion anomaly peripheral to the nodule is noted. Another small 8 mm lesion is identified in the right liver (14:22), in compatible with simple cyst. 14 mm simple cyst in the dome of the liver is stable since CT chest of 01/24/2017. 10 mm cyst in the lateral segment left liver also appears stable since 01/24/2017. 8 mm simple cyst in the inferior right liver (10:18) was not included on prior CT chest. Nodular liver contour compatible with cirrhosis. Numerous  tiny gallstones evident. No intrahepatic or extrahepatic biliary dilation. Pancreas: No focal mass lesion. No dilatation of the main duct. No intraparenchymal cyst. No peripancreatic edema. Spleen:  Spleen is 12.2 cm craniocaudal length, upper normal. Adrenals/Urinary Tract: No adrenal nodule or mass. Kidneys unremarkable. Stomach/Bowel: Stomach is nondistended. No gastric wall thickening. No evidence of outlet obstruction. Duodenum is normally positioned as is the ligament of Treitz. Vascular/Lymphatic: No abdominal aortic aneurysm. No abdominal lymphadenopathy. Other:  No intraperitoneal free fluid. Musculoskeletal: No abnormal marrow enhancement within the visualized bony anatomy IMPRESSION: 1. 2.5 cm lesion in the medial segment left liver shows no central enhancement and irregular peripheral hypervascularity. Imaging features are highly suspicious for malignancy although not specific for hepatocellular carcinoma (LR-M). As such, metastatic disease is also consideration. 2. 8 mm non-cystic lesion the anterior liver cannot be definitively characterized but is concerning for second neoplastic lesion. 3. Hepatic cysts. 4. Cholelithiasis. Electronically Signed   By: Misty Stanley M.D.   On: 04/19/2018 11:00    Labs:  CBC: Recent Labs    05/19/17 1335 03/02/18 1328 05/05/18 1034  WBC 3.0* 3.0* 2.9*  HGB 13.2 13.1 13.1  HCT 37.4* 37.9* 38.6*  PLT 76* 76* 81*    COAGS: Recent Labs    05/19/17 1335 05/05/18 1034  INR 1.02 0.90    BMP: Recent Labs    05/19/17 1335 03/02/18 1328  NA 137 139  K 4.1 4.2  CL 100* 103  CO2 29 28  GLUCOSE 100* 87  BUN 11 11  CALCIUM 8.9 9.3  CREATININE 0.68 0.80  GFRNONAA >60 >60  GFRAA >60 >60    LIVER FUNCTION TESTS: Recent Labs    05/19/17 1335 03/02/18 1328  BILITOT 0.6 0.8  AST 27 23  ALT 17 17  ALKPHOS 58 67  PROT 7.1 7.7  ALBUMIN 4.4 4.5    TUMOR MARKERS: No results for input(s): AFPTM, CEA, CA199, CHROMGRNA in the last 8760  hours.  Assessment and Plan:  SAMAJ WESSELLS is a 62 y.o. 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    Risks and benefits of US guided liver lesion biopsy was discussed with the patient including, but not limited to bleeding, infection, damage to adjacent structures or low yield requiring additional tests.  All of the patient's questions were answered, patient is agreeable to proceed. Consent signed and in chart.  Thank you for this interesting consult.  I greatly enjoyed meeting John [name] and look forward to participating in their care.  A copy of this report was sent to the requesting provider on this date.  Electronically Signed: Sandi Mariscal, MD 05/05/2018, 11:25 AM   I spent a total of 15 Minutes in face to face in clinical consultation, greater than 50% of which was counseling/coordinating care for US guided liver lesion biopsy.

## 2018-05-10 ENCOUNTER — Telehealth: Payer: Self-pay | Admitting: *Deleted

## 2018-05-10 NOTE — Telephone Encounter (Signed)
-----   Message from Lequita Asal, MD sent at 05/10/2018 12:28 PM EDT -----  Await for biopsy results before we schedule.  M  ----- Message ----- From: Shirlean Kelly, RN Sent: 05/10/2018  10:41 AM EDT To: Lequita Asal, MD  Patient does not come back to see you until 9-23.  Do you want me to call with results or schedule  him to see you for results?  Thanks! ----- Message ----- From: Donnita Falls, RN Sent: 05/09/2018   3:47 PM EDT To: Shirlean Kelly, RN  patient called and left a message and would like the results of his biopsy, please call  Thanks,

## 2018-05-10 NOTE — Telephone Encounter (Signed)
Called and lvm for patient that his biopsy results are not back yet.  Will call him when they are in.

## 2018-05-11 LAB — SURGICAL PATHOLOGY

## 2018-05-19 ENCOUNTER — Other Ambulatory Visit: Payer: Self-pay | Admitting: Urgent Care

## 2018-05-19 DIAGNOSIS — R160 Hepatomegaly, not elsewhere classified: Secondary | ICD-10-CM

## 2018-05-23 ENCOUNTER — Telehealth: Payer: Self-pay | Admitting: *Deleted

## 2018-05-23 NOTE — Telephone Encounter (Addendum)
Called patient and LVM that instead of him having a CT,  MD has ordered a PET scan which has been approved and scheduled for 05-30-18 @ 8:30 AM.  Patient to follow up with lab/MD on 06-01-18 @ 10:15 for lab and 10:45 for MD.  Advised patient that I will call back to confirm he got this message.

## 2018-05-24 ENCOUNTER — Telehealth: Payer: Self-pay | Admitting: *Deleted

## 2018-05-24 NOTE — Telephone Encounter (Signed)
Called patient and offered him an appointment to come to [facility] today to discuss biopsy report with Dr. Mike Gip.  Patient states he cannot come today.  He further stated in reference to my call to him yesterday regarding his PET scan that he cannot do it on Tuesday, however, states he can on Monday.  Informed him that I will have scheduling call him to reschedule.

## 2018-05-25 ENCOUNTER — Other Ambulatory Visit: Payer: Self-pay | Admitting: *Deleted

## 2018-05-29 ENCOUNTER — Ambulatory Visit: Payer: Medicare PPO

## 2018-05-29 ENCOUNTER — Ambulatory Visit: Payer: 59

## 2018-05-30 ENCOUNTER — Other Ambulatory Visit: Payer: Self-pay

## 2018-06-01 ENCOUNTER — Encounter: Payer: Self-pay | Admitting: Hematology and Oncology

## 2018-06-01 ENCOUNTER — Ambulatory Visit: Payer: Self-pay | Admitting: Hematology and Oncology

## 2018-06-01 ENCOUNTER — Inpatient Hospital Stay: Payer: Medicare PPO | Attending: Hematology and Oncology | Admitting: Hematology and Oncology

## 2018-06-01 ENCOUNTER — Other Ambulatory Visit: Payer: Self-pay

## 2018-06-01 VITALS — BP 144/68 | HR 50 | Temp 97.9°F | Resp 18 | Wt 199.6 lb

## 2018-06-01 DIAGNOSIS — R162 Hepatomegaly with splenomegaly, not elsewhere classified: Secondary | ICD-10-CM

## 2018-06-01 DIAGNOSIS — R160 Hepatomegaly, not elsewhere classified: Secondary | ICD-10-CM

## 2018-06-01 DIAGNOSIS — D696 Thrombocytopenia, unspecified: Secondary | ICD-10-CM | POA: Diagnosis not present

## 2018-06-01 DIAGNOSIS — C229 Malignant neoplasm of liver, not specified as primary or secondary: Secondary | ICD-10-CM | POA: Diagnosis present

## 2018-06-01 DIAGNOSIS — K7460 Unspecified cirrhosis of liver: Secondary | ICD-10-CM | POA: Diagnosis not present

## 2018-06-01 DIAGNOSIS — R7989 Other specified abnormal findings of blood chemistry: Secondary | ICD-10-CM | POA: Diagnosis not present

## 2018-06-01 DIAGNOSIS — C801 Malignant (primary) neoplasm, unspecified: Secondary | ICD-10-CM | POA: Diagnosis present

## 2018-06-01 DIAGNOSIS — R5383 Other fatigue: Secondary | ICD-10-CM

## 2018-06-01 NOTE — Progress Notes (Signed)
[facility] day:  06/01/2018    Chief Complaint: John [name] is a 62 y.o. 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  HPI:  The patient was last seen in the medical oncology clinic on 04/25/2018.  At that time, he was fatigued.  He denied abdominal pain. He continued to have upper chest wall pain associated mainly with heavy lifting. Exam was grossly stable.   At last visit, we discussed his abdominal MRI.  Imaging was not definitive for hepatocellular carcinoma.  Differential included metastatic disease.  We discussed consideration of biopsy.  Chest CT was scheduled for 05/29/2018.  He was presented at tumor board on 04/27/2018.  Liver biopsy on 05/05/2018 revealed adenocarcinoma with abundant necrosis.  IHC stains were positive for cytokeratin 7 and negative for TTF-1, Napsin, CDX-2, HepPar1. Differential diagnosis included metastatic adenocarcinoma versus primary intrahepatic cholangiocarcinoma. Possible extrahepatic sites of tumororigin include lung, upper GI, pancreas, and prostate.  Recommend correlation with follow-up imaging.  He has a PET scan scheduled for 06/02/2018.  During the interim, patient continues to be fatigued. He denies any acute concerns. No abdominal pain or distention. Patient denies bleeding; no hematochezia, melena, or gross hematuria. He denies an areas of unexplained bruising. Patient denies that he has experienced any B symptoms. He denies any interval infections. Patient here today to review biopsy results from recent liver sampling.   Patient advises that he maintains an adequate appetite. He is eating well. Weight today is 199 lb 9 oz (90.5 kg), which compared to his last visit to the clinic, represents a 4 pound increase.  Patient  denies pain in the clinic today.   Past Medical History:  Diagnosis Date  . Abnormal MRI, lumbar spine (2015) 01/21/2016   FINDINGS:  L3-4: Tiny right foraminal and extra foraminal disc bulge adjacent to but not compressing the L3 nerve lateral to the neural foramen. L4-5: There is a small broad-based disc bulge. There is also hypertrophy of the ligamentum flavum and facet joints, left more than right creating moderately severe spinal stenosis and bilateral lateral recess stenosis, left greater than right. This appe  . Anxiety   . Back ache 05/06/2014  . BP (high blood pressure) 06/23/2015  . Cervical pain 05/06/2014  . 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[place]) 06/23/2015  . COPD (chronic obstructive pulmonary disease) ([place])   . De Quervain's tenosynovitis, left 07/07/2017  . Depression   . Dystonia   . Epileptic disorder ([place]) 06/23/2015  . Extremity pain 05/06/2014  . Febrile seizures ([place])    child  . Foot pain 09/11/2014  . Gastric ulcer 06/23/2015  . Gout   . H/O neoplasm 06/23/2015  . Head injury 06/23/2015  . Hemorrhoids   . Hepatic cirrhosis ([place]) 06/23/2015  . Hepatitis B   . History of brain tumor 07/07/2017  . History of GI bleed   . Hypertension   . Leukopenia   . Neurogenic pain 01/21/2016  . Nodule of upper lobe of left lung 12/02/2017  . Obstructive sleep apnea 06/23/2015  . 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 Osteoarthritis of spine with radiculopathy, lumbosacral region 06/23/2015  . Peripheral neuropathy 06/23/2015  . Peripheral neuropathy (Al[place]) 44/11/4740  . Personal history of tobacco use, presenting hazards to health 01/09/2016  . Pulse irregularity   .  Seizures ([place])   . Spinal stenosis   . Splenomegaly 05/30/2013  . Thrombocytopenia ([place]) 06/23/2015  . Uncomplicated opioid dependence ([place]) 06/23/2015    Past Surgical History:  Procedure Laterality Date  . ESOPHAGOGASTRODUODENOSCOPY  11/2013  . surgery for cervical neck  fracture    . TOOTH EXTRACTION Right 09/28/2016    Family History  Problem Relation Age of Onset  . Stroke Mother   . Cancer Father   . Heart disease Father   . Stroke Father   . Cancer Brother 56       Lung Cancer    Social History:  reports that he has quit smoking. His smoking use included cigarettes. He started smoking about 7 months ago. He has a 17.50 pack-year smoking history. His smokeless tobacco use includes snuff. He reports that he does not drink alcohol or use drugs.  He has not smoked in 20 days.  The patient lives in [place].  The patient is accompanied by friend Erline Levine) today.  Allergies:  Allergies  Allergen Reactions  . Codeine Nausea Only  . Codeine Sulfate Nausea Only  . Morphine Nausea Only  . Duloxetine Rash    Made me sick    Current Medications: Current Outpatient Medications  Medication Sig Dispense Refill  . ALPRAZolam (XANAX) 0.25 MG tablet Take 0.25 mg by mouth at bedtime as needed for anxiety.    . baclofen (LIORESAL) 10 MG tablet Take 1 tablet (10 mg total) by mouth 3 (three) times daily. (Patient taking differently: Take 10 mg by mouth 2 (two) times daily. ) 270 tablet 0  . cetirizine (ZYRTEC) 10 MG tablet Take 10 mg by mouth daily.  3  . folic acid (FOLVITE) 1 MG tablet Take 1 mg by mouth daily.     Marland Kitchen gabapentin (NEURONTIN) 600 MG tablet Take 1 tablet (600 mg total) by mouth 3 (three) times daily. 270 tablet 0  . Magnesium Oxide 500 MG CAPS Take 1 capsule (500 mg total) by mouth 2 (two) times daily at 8 am and 10 pm. 180 capsule 0  . oxyCODONE (OXY IR/ROXICODONE) 5 MG immediate release tablet Take 0.5 tablets (2.5 mg total) by mouth 2 (two) times daily. 30 tablet 0   No current facility-administered medications for this visit.     Review of Systems  Constitutional: Positive for malaise/fatigue. Negative for diaphoresis, fever and weight loss (up 4 pounds).       "I am doing ok".   HENT: Negative.   Eyes: Negative.   Respiratory: Negative  for cough, hemoptysis, sputum production and shortness of breath.   Cardiovascular: Negative for chest pain, palpitations, orthopnea, leg swelling and PND.  Gastrointestinal: Positive for constipation (occassional). Negative for abdominal pain, blood in stool, diarrhea, melena, nausea and vomiting.  Genitourinary: Negative for dysuria, frequency, hematuria and urgency.  Musculoskeletal: Positive for back pain (chronic). Negative for falls, joint pain and myalgias.  Skin: Negative for itching and rash.  Neurological: Positive for tremors. Negative for dizziness, weakness and headaches.  Endo/Heme/Allergies: Does not bruise/bleed easily.  Psychiatric/Behavioral: Negative for depression, memory loss and suicidal ideas. The patient is not nervous/anxious and does not have insomnia.   All other systems reviewed and are negative.  Performance status (ECOG): 1 - Symptomatic but completely ambulatory  Vital Signs BP (!) 144/68 (BP Location: Left Arm, Patient Position: Sitting)   Pulse (!) 50   Temp 97.9 F (36.6 C) (Tympanic)   Resp 18   Wt 199 lb 9 oz (90.5 kg)  BMI 27.07 kg/m   Physical Exam  Constitutional: He is well-developed, well-nourished, and in no distress.  HENT:  Head: Normocephalic and atraumatic.  Dark hair with graying.  Eyes: Pupils are equal, round, and reactive to light. EOM are normal. No scleral icterus.  Hazel eyes  Neck: Neck supple.  Psychiatric: Mood, affect and judgment normal.  Nursing note and vitals reviewed.   Imaging studies: 11/13/2015:  RUQ ultrasound revealed stable cirrhotic changes within the liver.  There were no solid appearing hepatic mass.   09/17/2016:  RUQ ultrasound revealed cirrhotic changes within the liver and no focal solid hepatic mass.   05/25/2017:  Abdominal ultrasound revealed a cirrhotic liver without a focal hepatic mass.  There was mild splenomegaly.  There was abdominal aortic ectasia. 11/01/2017:  Chest CT nodule follow-up  revealed Lung-RADS 4B, suspicious. Interval growth of irregular solid apical left upper lobe pulmonary nodule measuring 9.0 mm in volume derived mean diameter, suspicious for primary bronchogenic carcinoma. 11/24/2017:  PET scan revealed a hypermetabolic 1.0 cm left apical pulmonary nodule favoring malignancy. Assuming non-small cell lung cancer this represents T1a N0 M0 disease (stage IA). 03/17/2018:  Abdominal ultrasound  revealed a possible 2.3 cm mass in the right liver lobe.  Liver was cirrhotic.  There was mild splenomegaly.  There was cholelithiasis without cholecystitis.    No visits with results within 3 Day(s) from this visit.  Latest known visit with results is:  Hospital Outpatient Visit on 05/05/2018  Component Date Value Ref Range Status  . WBC 05/05/2018 2.9* 3.8 - 10.6 K/uL Final  . RBC 05/05/2018 4.07* 4.40 - 5.90 MIL/uL Final  . Hemoglobin 05/05/2018 13.1  13.0 - 18.0 g/dL Final  . HCT 05/05/2018 38.6* 40.0 - 52.0 % Final  . MCV 05/05/2018 94.9  80.0 - 100.0 fL Final  . MCH 05/05/2018 32.2  26.0 - 34.0 pg Final  . MCHC 05/05/2018 34.0  32.0 - 36.0 g/dL Final  . RDW 05/05/2018 14.3  11.5 - 14.5 % Final  . Platelets 05/05/2018 81* 150 - 440 K/uL Final   Performed at [facility], [address], [place], [place] [address]  . Prothrombin Time 05/05/2018 12.1  11.4 - 15.2 seconds Final  . INR 05/05/2018 0.90   Final   Performed at [facility], [address] [place], [place] [address]  . SURGICAL PATHOLOGY 05/05/2018    Final                   Value:Surgical Pathology CASE: [id number] PATIENT: John [name] Surgical Pathology Report     SPECIMEN SUBMITTED: A. Liver; bx  CLINICAL HISTORY: History of left apical lung nodule s/p SBRT and hepatitis B with cirrhosis; now with new 2.5 cm liver mass  PRE-OPERATIVE DIAGNOSIS: Metastasis versus HCC  POST-OPERATIVE DIAGNOSIS: Same as above     DIAGNOSIS: A. LIVER MASS; ULTRASOUND-GUIDED  BIOPSY: - CYTOKERATIN 7 POSITIVE ADENOCARCINOMA WITH ABUNDANT NECROSIS, SEE COMMENT.  Comment: A panel of immunohistochemical stains was performed and the neoplastic cells demonstrate the following pattern of immunoreactivity: Cytokeratin 7: Positive TTF-1: Negative Napsin: Negative CDX 2: Negative HepPar1: Negative A CD34 stain highlights normal endothelium; characteristic staining pattern seen in [facility] is not identified. The differential diagnosis includes metastatic adenocarcinoma versus primary intrahepatic cholangiocarcinoma. Possible extrahepatic sites of tumor                          origin include lung, upper GI, pancreas, and prostate. Recommend correlation  with follow-up imaging. These findings were discussed with Dr. Mike Gip on 05/11/2018.  There is sufficient material in block A2 for ancillary molecular tests if needed.   IHC slides were prepared by [facility] for Molecular Biology and Pathology, [facility], [place]. All controls stained appropriately.  This test was developed and its performance characteristics determined by [facility]. It has not been cleared or approved by the [place] Food and Drug Administration. The FDA does not require this test to go through premarket FDA review. This test is used for clinical purposes. It should not be regarded as investigational or for research. This laboratory is certified under the Clinical Laboratory Improvement Amendments (CLIA) as qualified to perform high complexity clinical laboratory testing.   GROSS DESCRIPTION: A. Labeled: Liver lesion biopsy Received: In formalin Tissue fragment(s): Multiple Size:                          Aggregate, 4.7 x 0.1 x 0.1 cm Description: Pink-tan cores and tissue fragments Entirely submitted in two cassettes.    Final Diagnosis performed by Quay Burow, MD.   Electronically signed 05/11/2018 2:40:03PM The electronic signature indicates that the named Attending Pathologist has evaluated the  specimen  Technical component performed at [facility], [address] [place], [place] [address] Lab: [phone] Dir: Rush Farmer, MD, MMM  Professional component performed at [facility], [facility], [place], [place], [place] [address] Lab: [phone] Dir: Dellia Nims. Reuel Derby, MD     Assessment:  KESHAN REHA is a 62 y.o. male with chronic thrombocytopenia since 2013.  He is hepatitis B positive.  He has cirrhosis.  Platelet count has ranged between 55,000 and 72,000 from 11/09/2012 until 10/28/2014. He previously drank a significant amount of alcohol beginning in his teens until 03/23/2012.  Diet is modest.  He denies any new medications or herbal products.  Work-up in 2014 revealed the following normal labs:  Coom[place], B12, Hepatitis B surface antigen, hepatitis C, and HIV testing. ANA was positive with RNP of 1.6 on 12/21/2012. Work-up on 08/08/2015 revealed a normal PTT, B12, folate, liver function tests, and creatinine.  Hepatitis B core antibody total was positive on 08/08/2015.  AFP has been followed:  6.4 on 03/20/2012, 4.7 on 11/09/2012, 3.2 on 12/13/2013, 3.6 on 11/06/2015, 3.1 on 09/14/2016, 3.5 on 05/19/2017, and 7.4 on 03/02/2018.  Patient has had serial abdominal ultrasounds.  Spleen was 13 cm on 12/26/2012, 14.6 cm on 05/30/2013, and 11.2 cm on 12/11/2013. Abdominal ultrasound revealed splenic volume of 506 cm3 on 12/11/2013 and 718.8 cm3 on 12/23/2014.  Abdominal ultrasound on 05/25/2017 revealed a cirrhotic liver without a focal hepatic mass.  There was mild splenomegaly.  There was abdominal aortic ectasia.  Abdominal ultrasound on 03/17/2018 revealed a possible 2.3 cm mass in the right liver lobe.  Liver was cirrhotic.  There was mild splenomegaly.  There was cholelithiasis without cholecystitis.  PET scan on 11/24/2017 revealed a hypermetabolic 1.0 cm left apical pulmonary nodule favoring malignancy. Assuming non-small cell lung cancer this represents T1a  N0 M0 disease (stage IA).  PFTs on 11/29/2017 revealed severe obstruction with probable bronchodilator effect. FEV1 was 1.62 liter (49% predicted) and post FEV1 was 1.91 liters (57%).  DLCO was 60% and DLCO/VA was 88%.  He is felt to be a poor surgical candidate.  He received SBRT from 01/17/2018 - 02/01/2018.  Liver MRI on 04/19/2018 revealed a 2.5 cm lesion in the medial segment left liver with no central enhancement and  irregular peripheral hypervascularity. Imaging features were highly suspicious for malignancy (hepatocellular carcinoma or metastatic disease).  There was an 8 mm non-cystic lesion the anterior liver cannot be definitively characterized but was concerning for second neoplastic lesion.  Liver biopsy on 05/05/2018 revealed adenocarcinoma with abundant necrosis.  IHC stains were positive for cytokeratin 7 and negative for TTF-1, Napsin, CDX-2, HepPar1. Differential diagnosis included metastatic adenocarcinoma versus primary intrahepatic cholangiocarcinoma. Possible extrahepatic sites of tumororigin include lung, upper GI, pancreas, and prostate.  Symptomatically, he remains fatigued. He denies any acute concerns. Patient denies abdominal pain or distention. Exam remains grossly stable.   Plan: 1. Adenocarcinoma of unknown origin  Review results of liver biopsy. Pathology (+) for adenocarcinoma, however primary site is unknown.   Differentials include metastatic adenocarcinoma versus primary metastatic intrahepatic cholangiocarcinoma.   Extrahepatic sites of tumor origin include lung, UGI, pancreas, and prostate.   Discuss need to proceed with PET imaging to determine areas of hypermetabolic activity that may suggest a site of primary metastasis.   PET scan scheduled for 06/02/2018.  If PET imaging inconclusive, discuss sending Cancer TYPE ID testing through [facility] and +/- upper endoscopy.   Patient to be discussed at multidisciplinary tumor board on 06/08/2018.   2. Thrombocytopenia  Chronic. Platelets on 05/05/2018 were 81,000.  No bruising or bleeding.   Etiology related to underlying pathology (cirrhosis and splenomegaly).  3. Fatigue  Recent labs reviewed. WBC 2900, hemoglobin 13.1, hematocrit 38.6, MCV 94.9.  Multifactorial. Likely related to underlying malignancy, and chronic co-morbidities (HTN, COPD, OSAH, cirrhosis, chronic pain, depression) 4. Patient wishes to cancel appoint appointments on 06/06/2018, both with medical and radiation oncology. 5. RTC on 06/08/2018 for MD assessment to review PET results and tumor board discussions.  6. Reschedule radiation oncology appt per patient request.  Make both appointments AFTER tumor board.    Honor Loh, NP 06/01/2018, 11:03 AM   I saw and evaluated the patient, participating in the key portions of the service and reviewing pertinent diagnostic studies and records.  I reviewed the nurse practitioner's note and agree with the findings and the plan.  The assessment and plan were discussed with the patient.  Several questions were asked by the patient and answered.   Lequita Asal, MD  06/01/2018, 11:03 AM

## 2018-06-01 NOTE — Progress Notes (Signed)
Patient states he has been having headaches a lot recently.  Patient here today to consult with MD prior to PET scan.  Patient accompanied by friend, Erline Levine.

## 2018-06-02 ENCOUNTER — Ambulatory Visit
Admission: RE | Admit: 2018-06-02 | Discharge: 2018-06-02 | Disposition: A | Discharge status: Home / Self Care | Payer: Medicare PPO | Source: Ambulatory Visit | Attending: Urgent Care | Admitting: Urgent Care

## 2018-06-02 DIAGNOSIS — K7460 Unspecified cirrhosis of liver: Secondary | ICD-10-CM | POA: Insufficient documentation

## 2018-06-02 DIAGNOSIS — R160 Hepatomegaly, not elsewhere classified: Secondary | ICD-10-CM

## 2018-06-02 DIAGNOSIS — K8020 Calculus of gallbladder without cholecystitis without obstruction: Secondary | ICD-10-CM | POA: Insufficient documentation

## 2018-06-02 DIAGNOSIS — I700 Atherosclerosis of aorta: Secondary | ICD-10-CM | POA: Diagnosis not present

## 2018-06-02 LAB — GLUCOSE, CAPILLARY: Glucose-Capillary: 109 mg/dL — ABNORMAL HIGH (ref 70–99)

## 2018-06-02 MED ORDER — FLUDEOXYGLUCOSE F - 18 (FDG) INJECTION
10.8300 | Freq: Once | INTRAVENOUS | Status: AC | PRN
  Administered 2018-06-02: 10.83 via INTRAVENOUS

## 2018-06-05 ENCOUNTER — Ambulatory Visit: Payer: Self-pay | Admitting: Radiation Oncology

## 2018-06-06 ENCOUNTER — Inpatient Hospital Stay: Payer: Medicare PPO | Admitting: Hematology and Oncology

## 2018-06-06 ENCOUNTER — Inpatient Hospital Stay: Payer: Medicare PPO

## 2018-06-06 ENCOUNTER — Ambulatory Visit: Payer: Medicare PPO | Admitting: Radiation Oncology

## 2018-06-08 ENCOUNTER — Ambulatory Visit: Admission: RE | Admit: 2018-06-08 | Payer: Medicare PPO | Source: Ambulatory Visit | Admitting: Radiation Oncology

## 2018-06-08 ENCOUNTER — Encounter: Payer: Self-pay | Admitting: Hematology and Oncology

## 2018-06-08 ENCOUNTER — Inpatient Hospital Stay: Payer: Medicare PPO | Attending: Hematology and Oncology | Admitting: Hematology and Oncology

## 2018-06-08 VITALS — BP 122/67 | HR 55 | Temp 98.7°F | Resp 18 | Wt 200.4 lb

## 2018-06-08 DIAGNOSIS — Z79899 Other long term (current) drug therapy: Secondary | ICD-10-CM | POA: Diagnosis not present

## 2018-06-08 DIAGNOSIS — D6959 Other secondary thrombocytopenia: Secondary | ICD-10-CM | POA: Diagnosis not present

## 2018-06-08 DIAGNOSIS — Z87891 Personal history of nicotine dependence: Secondary | ICD-10-CM | POA: Insufficient documentation

## 2018-06-08 DIAGNOSIS — C787 Secondary malignant neoplasm of liver and intrahepatic bile duct: Secondary | ICD-10-CM | POA: Diagnosis not present

## 2018-06-08 DIAGNOSIS — D696 Thrombocytopenia, unspecified: Secondary | ICD-10-CM | POA: Diagnosis not present

## 2018-06-08 DIAGNOSIS — K7460 Unspecified cirrhosis of liver: Secondary | ICD-10-CM | POA: Diagnosis not present

## 2018-06-08 DIAGNOSIS — R5383 Other fatigue: Secondary | ICD-10-CM

## 2018-06-08 DIAGNOSIS — J449 Chronic obstructive pulmonary disease, unspecified: Secondary | ICD-10-CM

## 2018-06-08 DIAGNOSIS — C221 Intrahepatic bile duct carcinoma: Secondary | ICD-10-CM | POA: Diagnosis present

## 2018-06-08 DIAGNOSIS — C801 Malignant (primary) neoplasm, unspecified: Secondary | ICD-10-CM

## 2018-06-08 DIAGNOSIS — R161 Splenomegaly, not elsewhere classified: Secondary | ICD-10-CM

## 2018-06-08 DIAGNOSIS — B1910 Unspecified viral hepatitis B without hepatic coma: Secondary | ICD-10-CM

## 2018-06-08 DIAGNOSIS — R160 Hepatomegaly, not elsewhere classified: Secondary | ICD-10-CM

## 2018-06-08 DIAGNOSIS — R918 Other nonspecific abnormal finding of lung field: Secondary | ICD-10-CM

## 2018-06-08 DIAGNOSIS — I10 Essential (primary) hypertension: Secondary | ICD-10-CM

## 2018-06-08 NOTE — Progress Notes (Signed)
[facility] day:  06/08/2018    Chief Complaint: John [name] is a 62 y.o. 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  HPI:  The patient was last seen in the medical oncology clinic on 06/01/2018.  At that time,  he remained fatigued. He denied any acute concerns. Patient denied abdominal pain or distention. Exam remained grossly stable.   PET scan on 06/02/2018 revealed a solitary hypermetabolic lesion in the central left hepatic lobe c/w malignancy. There was no metabolic activity in the left apical pulmonary nodule, which was reduced in size. There was no evidence of metabolic adenopathy. Liver was cirrhotic. There was cholestasis.  Patient discussed at multidisciplinary tumor board today. Discussions were held regarding further molecular testing to determine site of primary origin and consideration of localized therapy (? radiofrequency ablation).    Symptomatically, patient denies any acute changes from visit on 06/01/2018. Fatigue persists. Patient denies that he has experienced any B symptoms. He denies any interval infections.   Patient advises that he maintains an adequate appetite. He is eating well. Weight today is 200 lb 6 oz (90.9 kg), which compared to his last visit to the clinic, represents a 1 pound increase.    Patient complains of pain rated 5/10 in the clinic today.   Past Medical History:  Diagnosis Date  . Abnormal MRI, lumbar spine (2015) 01/21/2016   FINDINGS:  L3-4: Tiny right foraminal and extra foraminal disc bulge adjacent to but not compressing the L3 nerve lateral to the neural foramen. L4-5: There is a small broad-based disc bulge. There is also hypertrophy of the ligamentum flavum and facet joints, left more than right creating  moderately severe spinal stenosis and bilateral lateral recess stenosis, left greater than right. This appe  . Anxiety   . Back ache 05/06/2014  . BP (high blood pressure) 06/23/2015  . Cervical pain 05/06/2014  . 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[place]) 06/23/2015  . COPD (chronic obstructive pulmonary disease) ([place])   . De Quervain's tenosynovitis, left 07/07/2017  . Depression   . Dystonia   . Epileptic disorder ([place]) 06/23/2015  . Extremity pain 05/06/2014  . Febrile seizures ([place])    child  . Foot pain 09/11/2014  . Gastric ulcer 06/23/2015  . Gout   . H/O neoplasm 06/23/2015  . Head injury 06/23/2015  . Hemorrhoids   . Hepatic cirrhosis ([place]) 06/23/2015  . Hepatitis B   . History of brain tumor 07/07/2017  . History of GI bleed   . Hypertension   . Leukopenia   . Neurogenic pain 01/21/2016  . Nodule of upper lobe of left lung 12/02/2017  . Obstructive sleep apnea 06/23/2015  . 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 Osteoarthritis of spine with radiculopathy, lumbosacral region 06/23/2015  . Peripheral neuropathy 06/23/2015  . Peripheral neuropathy ([place]) 81/44/8185  . Personal history of tobacco use, presenting hazards to health 01/09/2016  . Pulse irregularity   . Seizures ([place])   . Spinal stenosis   . Splenomegaly 05/30/2013  . Thrombocytopenia ([place]) 06/23/2015  . Uncomplicated opioid dependence ([place]) 06/23/2015    Past Surgical History:  Procedure Laterality Date  . ESOPHAGOGASTRODUODENOSCOPY  11/2013  . surgery for cervical neck fracture    . TOOTH EXTRACTION Right 09/28/2016    Family History  Problem Relation Age of Onset  . Stroke Mother   . Cancer Father   . Heart disease Father   . Stroke Father   . Cancer Brother 4       Lung Cancer    Social History:  reports that he has quit smoking. His smoking use included cigarettes. He started smoking about 8 months ago. He has a 17.50 pack-year smoking history. His smokeless  tobacco use includes snuff. He reports that he does not drink alcohol or use drugs.  He has not smoked in 20 days.  The patient lives in [place].  The patient is accompanied by friend Erline Levine) today.  Allergies:  Allergies  Allergen Reactions  . Codeine Nausea Only  . Codeine Sulfate Nausea Only  . Morphine Nausea Only  . Duloxetine Rash    Made me sick    Current Medications: Current Outpatient Medications  Medication Sig Dispense Refill  . ALPRAZolam (XANAX) 0.25 MG tablet Take 0.25 mg by mouth at bedtime as needed for anxiety.    . baclofen (LIORESAL) 10 MG tablet Take 1 tablet (10 mg total) by mouth 3 (three) times daily. (Patient taking differently: Take 10 mg by mouth 2 (two) times daily. ) 270 tablet 0  . cetirizine (ZYRTEC) 10 MG tablet Take 10 mg by mouth daily.  3  . folic acid (FOLVITE) 1 MG tablet Take 1 mg by mouth daily.     Marland Kitchen gabapentin (NEURONTIN) 600 MG tablet Take 1 tablet (600 mg total) by mouth 3 (three) times daily. 270 tablet 0  . Magnesium Oxide 500 MG CAPS Take 1 capsule (500 mg total) by mouth 2 (two) times daily at 8 am and 10 pm. 180 capsule 0  . oxyCODONE (OXY IR/ROXICODONE) 5 MG immediate release tablet Take 0.5 tablets (2.5 mg total) by mouth 2 (two) times daily. 30 tablet 0   No current facility-administered medications for this visit.     Review of Systems  Constitutional: Positive for malaise/fatigue. Negative for diaphoresis, fever and weight loss (up 1 pound).  HENT: Negative.   Eyes: Negative.   Respiratory: Negative for cough, hemoptysis, sputum production and shortness of breath.   Cardiovascular: Negative for chest pain, palpitations, orthopnea, leg swelling and PND.  Gastrointestinal: Positive for constipation (occassional). Negative for abdominal pain, blood in stool, diarrhea, melena, nausea and vomiting.  Genitourinary: Negative for dysuria, frequency, hematuria and urgency.  Musculoskeletal: Positive for back pain (chronic). Negative for  falls, joint pain and myalgias.  Skin: Negative for itching and rash.  Neurological: Positive for tremors. Negative for dizziness, weakness and headaches.  Endo/Heme/Allergies: Does not bruise/bleed easily.  Psychiatric/Behavioral: Negative for depression, memory loss and suicidal ideas. The patient is not nervous/anxious and does not have insomnia.   All other systems reviewed and are negative.  Performance status (ECOG): 1 - Symptomatic but completely ambulatory  Vital Signs BP 122/67 (BP Location: Left Arm, Patient Position: Sitting)   Pulse (!) 55   Temp 98.7 F (37.1 C) (Tympanic)   Resp 18   Wt 200 lb 6 oz (90.9 kg)   BMI 27.18 kg/m   Physical Exam  Constitutional: He is well-developed, well-nourished, and in no distress.  HENT:  Head: Normocephalic and atraumatic.  Dark hair with graying.  Eyes: Pupils are equal, round, and reactive to light. EOM are normal. No scleral icterus.  Hazel eyes  Neck: Normal range of motion.  Psychiatric: Mood, affect and judgment normal.  Nursing note and vitals reviewed.   Imaging studies:  11/13/2015:  RUQ ultrasound revealed stable cirrhotic changes within the liver.  There were no solid appearing hepatic mass.   09/17/2016:  RUQ ultrasound revealed cirrhotic changes within the liver and no focal solid hepatic mass.   05/25/2017:  Abdominal ultrasound revealed a cirrhotic liver without a focal hepatic mass.  There was mild splenomegaly.  There was abdominal aortic ectasia. 11/01/2017:  Chest CT nodule follow-up revealed Lung-RADS 4B, suspicious. Interval growth of irregular solid apical left upper lobe pulmonary nodule measuring 9.0 mm in volume derived mean diameter, suspicious for primary bronchogenic carcinoma. 11/24/2017:  PET scan revealed a hypermetabolic 1.0 cm left apical pulmonary nodule favoring malignancy. Assuming non-small cell lung cancer this represents T1a N0 M0 disease (stage IA). 03/17/2018:  Abdominal ultrasound  revealed  a possible 2.3 cm mass in the right liver lobe.  Liver was cirrhotic.  There was mild splenomegaly.  There was cholelithiasis without cholecystitis.    Hospital Outpatient Visit on 06/02/2018  Component Date Value Ref Range Status  . Glucose-Capillary 06/02/2018 109* 70 - 99 mg/dL Final    Assessment:  John [name] is a 62 y.o. male with chronic thrombocytopenia since 2013.  He is hepatitis B positive.  He has cirrhosis.  Platelet count has ranged between 55,000 and 72,000 from 11/09/2012 until 10/28/2014. He previously drank a significant amount of alcohol beginning in his teens until 03/23/2012.  Diet is modest.  He denies any new medications or herbal products.  Work-up in 2014 revealed the following normal labs:  [place], B12, Hepatitis B surface antigen, hepatitis C, and HIV testing. ANA was positive with RNP of 1.6 on 12/21/2012. Work-up on 08/08/2015 revealed a normal PTT, B12, folate, liver function tests, and creatinine.  Hepatitis B core antibody total was positive on 08/08/2015.  AFP has been followed:  6.4 on 03/20/2012, 4.7 on 11/09/2012, 3.2 on 12/13/2013, 3.6 on 11/06/2015, 3.1 on 09/14/2016, 3.5 on 05/19/2017, and 7.4 on 03/02/2018.  Patient has had serial abdominal ultrasounds.  Spleen was 13 cm on 12/26/2012, 14.6 cm on 05/30/2013, and 11.2 cm on 12/11/2013. Abdominal ultrasound revealed splenic volume of 506 cm3 on 12/11/2013 and 718.8 cm3 on 12/23/2014.  Abdominal ultrasound on 05/25/2017 revealed a cirrhotic liver without a focal hepatic mass.  There was mild splenomegaly.  There was abdominal aortic ectasia.  Abdominal ultrasound on 03/17/2018 revealed a possible 2.3 cm mass in the right liver lobe.  Liver was cirrhotic.  There was mild splenomegaly.  There was cholelithiasis without cholecystitis.  PET scan on 11/24/2017 revealed a hypermetabolic 1.0 cm left apical pulmonary nodule favoring malignancy. Assuming non-small cell lung cancer this represented a T1a N0 M0 disease  (stage IA).  PFTs on 11/29/2017 revealed severe obstruction with probable bronchodilator effect. FEV1 was 1.62 liter (49% predicted) and post FEV1 was 1.91 liters (57%).  DLCO was 60% and DLCO/VA was 88%.  He is felt to be a poor surgical candidate.  He received SBRT from 01/17/2018 - 02/01/2018.  Liver MRI on 04/19/2018 revealed a 2.5 cm lesion in the medial segment left liver with no central enhancement and irregular peripheral hypervascularity. Imaging features were highly suspicious for malignancy (hepatocellular carcinoma or metastatic disease).  There was an 8 mm non-cystic lesion the anterior liver cannot be definitively characterized but was concerning for second neoplastic lesion.  Liver biopsy on 05/05/2018 revealed adenocarcinoma with abundant necrosis.  IHC stains were positive for cytokeratin 7 and negative for TTF-1, Napsin, CDX-2, HepPar1. Differential diagnosis included metastatic adenocarcinoma versus primary intrahepatic cholangiocarcinoma. Possible  extrahepatic sites of tumororigin include lung, upper GI, pancreas, and prostate.  PET scan on 06/02/2018 revealed a solitary hypermetabolic lesion in the central left hepatic lobe c/w malignancy.  There was no metabolic activity in the left apical pulmonary nodule, which was reduced in size.  There was no evidence of metabolic adenopathy.  Liver was cirrhotic.  There was cholestasis.  Symptomatically, patient remains fatigued. He has no acute concerns today. He is here to review testing results. No abdominal pain. Exam is stable.   Plan: 1. Adenocarcinoma of unknown origin  Review PET scan. Imaging personally reviewed and felt to be consistent with the dictated radiology report. Imaging reviewed with patient.   Review results of liver biopsy.  Pathology (+) for adenocarcinoma, however primary site is unknown.  Differentials include metastatic adenocarcinoma versus primary metastatic intrahepatic cholangiocarcinoma.  Extrahepatic  sites of tumor origin include lung, UGI, pancreas, and prostate.  Review tumor board discussions.  Recommendations included referral to interventional radiology for consideration of local therapy.  Discuss need for further molecular testing to determine tumor primary.    Tumor of unknown origin (Cancer TYPE ID through [facility]) testing was sent on 06/02/2018. Results are currently pending.  Patient was offered to be referred to another institution for second opinion.  Patient agrees.    Undecided between [facility].  Patient to return to call to the clinic after discussing with family. 2. Thrombocytopenia  Chronic.  Platelets on 05/05/2018 were 81,000.  No bruising or bleeding.  Etiology related to underlying pathology (cirrhosis, splenomegaly). 3. Fatigue  Multifactorial issue is likely related to underlying malignancy and chronic comorbidities (hypertension, COPD, OSAH, cirrhosis, chronic pain, depression). 4. RTC will be based on patient's decision to pursue treatment here, [facility], or at [facility].  Will await patient's direction prior to making referral.   Honor Loh, NP 06/08/2018, 3:46 PM   I saw and evaluated the patient, participating in the key portions of the service and reviewing pertinent diagnostic studies and records.  I reviewed the nurse practitioner's note and agree with the findings and the plan.  The assessment and plan were discussed with the patient.  Several questions were asked by the patient and answered.   Lequita Asal, MD  06/08/2018, 3:46 PM

## 2018-06-08 NOTE — Progress Notes (Signed)
Pt in for follow up and scan results.

## 2018-06-12 ENCOUNTER — Other Ambulatory Visit: Payer: Self-pay | Admitting: Urgent Care

## 2018-06-12 DIAGNOSIS — R160 Hepatomegaly, not elsewhere classified: Secondary | ICD-10-CM

## 2018-06-15 ENCOUNTER — Telehealth: Payer: Self-pay

## 2018-06-15 ENCOUNTER — Encounter: Payer: Self-pay | Admitting: Hematology and Oncology

## 2018-06-15 ENCOUNTER — Telehealth: Payer: Self-pay | Admitting: Hematology

## 2018-06-15 NOTE — Telephone Encounter (Signed)
Pt referred to our office from [facility] for second opinion for liver mass. I cld and spoke to the pt to schedule an appt. Pt has been scheduled to see Dr. Burr Medico on 10/14 at 230pm. Pt aware to arrive 30 minutes early. Letter mailed.

## 2018-06-15 NOTE — Telephone Encounter (Signed)
received a call from the patient asking for a referral / looking in the chart the referral has already been done as of 06/12/18. I spoke with NP Aaron Edelman and he has called the [facility] cancer [facility] office. They have reported they have been trying to get in touch with the patient. The patient states his father has passed away and has not been by his phone. The patient was understanding and agreeable to wait on the call today for the referral.

## 2018-06-19 ENCOUNTER — Encounter: Payer: Self-pay | Admitting: Hematology

## 2018-06-19 ENCOUNTER — Inpatient Hospital Stay: Payer: Medicare PPO

## 2018-06-19 ENCOUNTER — Inpatient Hospital Stay: Payer: Medicare PPO | Attending: Hematology | Admitting: Hematology

## 2018-06-19 VITALS — BP 138/68 | HR 59 | Temp 98.5°F | Resp 17 | Ht 73.0 in | Wt 198.8 lb

## 2018-06-19 DIAGNOSIS — C229 Malignant neoplasm of liver, not specified as primary or secondary: Secondary | ICD-10-CM

## 2018-06-19 DIAGNOSIS — D6959 Other secondary thrombocytopenia: Secondary | ICD-10-CM

## 2018-06-19 DIAGNOSIS — R160 Hepatomegaly, not elsewhere classified: Secondary | ICD-10-CM

## 2018-06-19 DIAGNOSIS — Z79899 Other long term (current) drug therapy: Secondary | ICD-10-CM

## 2018-06-19 DIAGNOSIS — B1910 Unspecified viral hepatitis B without hepatic coma: Secondary | ICD-10-CM | POA: Diagnosis not present

## 2018-06-19 DIAGNOSIS — R918 Other nonspecific abnormal finding of lung field: Secondary | ICD-10-CM

## 2018-06-19 DIAGNOSIS — K7460 Unspecified cirrhosis of liver: Secondary | ICD-10-CM

## 2018-06-19 DIAGNOSIS — C221 Intrahepatic bile duct carcinoma: Secondary | ICD-10-CM | POA: Diagnosis not present

## 2018-06-19 DIAGNOSIS — Z87891 Personal history of nicotine dependence: Secondary | ICD-10-CM

## 2018-06-19 LAB — CBC WITH DIFFERENTIAL (CANCER CENTER ONLY)
Abs Immature Granulocytes: 0.01 10*3/uL (ref 0.00–0.07)
Basophils Absolute: 0 10*3/uL (ref 0.0–0.1)
Basophils Relative: 0 %
EOS PCT: 4 %
Eosinophils Absolute: 0.1 10*3/uL (ref 0.0–0.5)
HEMATOCRIT: 41.2 % (ref 39.0–52.0)
HEMOGLOBIN: 13.5 g/dL (ref 13.0–17.0)
Immature Granulocytes: 0 %
LYMPHS ABS: 0.6 10*3/uL — AB (ref 0.7–4.0)
LYMPHS PCT: 22 %
MCH: 31.6 pg (ref 26.0–34.0)
MCHC: 32.8 g/dL (ref 30.0–36.0)
MCV: 96.5 fL (ref 80.0–100.0)
Monocytes Absolute: 0.3 10*3/uL (ref 0.1–1.0)
Monocytes Relative: 9 %
Neutro Abs: 1.8 10*3/uL (ref 1.7–7.7)
Neutrophils Relative %: 65 %
Platelet Count: 84 10*3/uL — ABNORMAL LOW (ref 150–400)
RBC: 4.27 MIL/uL (ref 4.22–5.81)
RDW: 13.5 % (ref 11.5–15.5)
WBC: 2.9 10*3/uL — AB (ref 4.0–10.5)
nRBC: 0 % (ref 0.0–0.2)

## 2018-06-19 LAB — CMP (CANCER CENTER ONLY)
ALT: 64 U/L — ABNORMAL HIGH (ref 0–44)
AST: 67 U/L — ABNORMAL HIGH (ref 15–41)
Albumin: 4.4 g/dL (ref 3.5–5.0)
Alkaline Phosphatase: 84 U/L (ref 38–126)
Anion gap: 10 (ref 5–15)
BILIRUBIN TOTAL: 0.8 mg/dL (ref 0.3–1.2)
BUN: 8 mg/dL (ref 8–23)
CHLORIDE: 103 mmol/L (ref 98–111)
CO2: 30 mmol/L (ref 22–32)
CREATININE: 0.83 mg/dL (ref 0.61–1.24)
Calcium: 9.9 mg/dL (ref 8.9–10.3)
Glucose, Bld: 81 mg/dL (ref 70–99)
POTASSIUM: 4.3 mmol/L (ref 3.5–5.1)
Sodium: 143 mmol/L (ref 135–145)
Total Protein: 8.3 g/dL — ABNORMAL HIGH (ref 6.5–8.1)

## 2018-06-19 NOTE — Progress Notes (Signed)
[name]  [phone] Fax:([phone] [place] Note   Patient Care Team: Cletis Athens, MD as PCP - General (Internal Medicine) Manya Silvas, MD (Gastroenterology)   Date of Service:  06/19/2018   Referring Physician:  Dr. Mike Gip.  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  HISTORY OF PRESENTING ILLNESS:  John [name] 62 y.o. male is a here because of concerning liver mass in right lobe. The patient was referred by Dr. Mike Gip. The patient presents to the clinic today accompanied by his friend. He states that he had hepatitis B and cirrhosis. He states doesn't see a hepatologist nor an infectious disease physician.  He states that he experiences dyspnea after radiation. He is avoiding climbing stairs. He also experiences intermittent RUQ dull pain, that sometimes radiates inferiorly and to his right side. He sometimes experiences bloating, but denies nausea. He doesn't have blood in stool He has black stools for 2 days a few weeks ago, but has resolved now. He had a colonoscopy 3-5 years ago.  He experiences intermittent headaches, but denies blurry vision.   He was a heavy drinker for 30 years, but has quit in 2013. He also quit smoking in 10/2017.  He is a case of depression and anxiety.   MEDICAL HISTORY:  Past Medical History:  Diagnosis Date  . Abnormal MRI, lumbar spine (2015) 01/21/2016   FINDINGS:  L3-4: Tiny right foraminal and extra foraminal disc bulge adjacent to but not compressing the L3 nerve lateral to the neural foramen. L4-5: There is a small broad-based disc bulge. There is also hypertrophy of the ligamentum flavum and facet joints, left more than right creating moderately severe spinal stenosis and bilateral lateral recess stenosis, left greater than right. This appe  . Anxiety   . Back ache 05/06/2014  . Cervical  pain 05/06/2014  . 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[place]) 06/23/2015  . COPD (chronic obstructive pulmonary disease) ([place])   . De Quervain's tenosynovitis, left 07/07/2017  . Depression   . Dystonia   . Epileptic disorder ([place]) 06/23/2015  . Extremity pain 05/06/2014  . Febrile seizures ([place])    child  . Foot pain 09/11/2014  . Gastric ulcer 06/23/2015  . Gout   . H/O neoplasm 06/23/2015  . Head injury 06/23/2015  . Hemorrhoids   . Hepatic cirrhosis ([place]) 06/23/2015  . Hepatitis B   . History of GI bleed   . Leukopenia   . Neurogenic pain 01/21/2016  . Nodule of upper lobe of left lung 12/02/2017  . Obstructive sleep apnea 06/23/2015  . 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 Osteoarthritis of spine with radiculopathy, lumbosacral region 06/23/2015  . Peripheral neuropathy 06/23/2015  . Peripheral neuropathy (Al[place]) 41/74/0814  . Personal history of tobacco use, presenting hazards to health 01/09/2016  . Pulse irregularity   . Seizures ([place])   . Spinal stenosis   . Splenomegaly 05/30/2013  . Thrombocytopenia ([place]) 06/23/2015  . Uncomplicated opioid dependence ([place]) 06/23/2015    SURGICAL HISTORY: Past Surgical History:  Procedure Laterality Date  . ESOPHAGOGASTRODUODENOSCOPY  11/2013  . surgery for cervical neck fracture    . TOOTH EXTRACTION Right 09/28/2016    SOCIAL HISTORY: Social History   Socioeconomic History  . Marital status: Single    Spouse name: Not on file  . Number of children: Not on file  . Years of education: Not on file  . Highest education level: Not on  file  Occupational History  . Not on file  Social Needs  . Financial resource strain: Not on file  . Food insecurity:    Worry: Not on file    Inability: Not on file  . Transportation needs:    Medical: Not on file    Non-medical: Not on file  Tobacco Use  . Smoking status: Former Smoker    Packs/day: 0.50    Years: 35.00    Pack years: 17.50    Types:  Cigarettes    Start date: 10/07/2017    Last attempt to quit: 10/07/2017    Years since quitting: 0.6  . Smokeless tobacco: Current User    Types: Snuff  Substance and Sexual Activity  . Alcohol use: No    Alcohol/week: 0.0 standard drinks    Comment: Pt used to drink alcohol for 30 years, states that he has been alcohol free since July 2013...  . Drug use: No  . Sexual activity: Not on file  Lifestyle  . Physical activity:    Days per week: Not on file    Minutes per session: Not on file  . Stress: Not on file  Relationships  . Social connections:    Talks on phone: Not on file    Gets together: Not on file    Attends religious service: Not on file    Active member of club or organization: Not on file    Attends meetings of clubs or organizations: Not on file    Relationship status: Not on file  . Intimate partner violence:    Fear of current or ex partner: Not on file    Emotionally abused: Not on file    Physically abused: Not on file    Forced sexual activity: Not on file  Other Topics Concern  . Not on file  Social History Narrative  . Not on file    FAMILY HISTORY: Family History  Problem Relation Age of Onset  . Stroke Mother   . Cancer Father 55       prostate cancer  . Heart disease Father   . Stroke Father   . Cancer Brother 60       Lung Cancer  . Cancer Maternal Aunt        breast cancer   . Cancer Cousin        breast cancer    ALLERGIES:  is allergic to codeine; codeine sulfate; morphine; and duloxetine.  MEDICATIONS:  Current Outpatient Medications  Medication Sig Dispense Refill  . ALPRAZolam (XANAX) 0.25 MG tablet Take 0.25 mg by mouth at bedtime as needed for anxiety.    . baclofen (LIORESAL) 10 MG tablet Take 1 tablet (10 mg total) by mouth 3 (three) times daily. (Patient taking differently: Take 10 mg by mouth 2 (two) times daily. ) 270 tablet 0  . cetirizine (ZYRTEC) 10 MG tablet Take 10 mg by mouth daily.  3  . folic acid (FOLVITE) 1 MG  tablet Take 1 mg by mouth daily.     Marland Kitchen gabapentin (NEURONTIN) 600 MG tablet Take 1 tablet (600 mg total) by mouth 3 (three) times daily. 270 tablet 0  . Magnesium Oxide 500 MG CAPS Take 1 capsule (500 mg total) by mouth 2 (two) times daily at 8 am and 10 pm. 180 capsule 0  . oxyCODONE (OXY IR/ROXICODONE) 5 MG immediate release tablet Take 0.5 tablets (2.5 mg total) by mouth 2 (two) times daily. 30 tablet 0   No current facility-administered  medications for this visit.     REVIEW OF SYSTEMS:   Constitutional: Denies fevers, chills or abnormal night sweats Eyes: Denies blurriness of vision, double vision or watery eyes Ears, nose, mouth, throat, and face: Denies mucositis or sore throat Respiratory: Denies cough or wheezes (+) dyspnea Cardiovascular: Denies palpitation, chest discomfort or lower extremity swelling Gastrointestinal:  Denies nausea, heartburn or change in bowel habits (+) black stools a few weeks ago (+) RUQ intermittent dull pain Skin: Denies abnormal skin rashes Lymphatics: Denies new lymphadenopathy or easy bruising Neurological:Denies numbness, tingling or new weaknesses Behavioral/Psych: Mood is stable, no new changes  All other systems were reviewed with the patient and are negative.  PHYSICAL EXAMINATION: ECOG PERFORMANCE STATUS: 1 - Symptomatic but completely ambulatory  Vitals:   06/19/18 1441  BP: 138/68  Pulse: (!) 59  Resp: 17  Temp: 98.5 F (36.9 C)  SpO2: 99%   Filed Weights   06/19/18 1441  Weight: 198 lb 12.8 oz (90.2 kg)    GENERAL:alert, no distress and comfortable SKIN: skin color, texture, turgor are normal, no rashes or significant lesions (+) multiple spider angioma EYES: normal, conjunctiva are pink and non-injected, sclera clear OROPHARYNX:no exudate, no erythema and lips, buccal mucosa, and tongue normal  NECK: supple, thyroid normal size, non-tender, without nodularity LYMPH:  no palpable lymphadenopathy in the cervical, axillary or  inguinal LUNGS: clear to auscultation and percussion with normal breathing effort HEART: regular rate & rhythm and no lower extremity edema (+) heart murmur  ABDOMEN:abdomen soft, non-tender and normal bowel sounds Musculoskeletal:no cyanosis of digits and no clubbing  PSYCH: alert & oriented x 3 with fluent speech NEURO: no focal motor/sensory deficits (+) tremor of neck  LABORATORY DATA:  I have reviewed the data as listed CBC Latest Ref Rng & Units 05/05/2018 03/02/2018 05/19/2017  WBC 3.8 - 10.6 K/uL 2.9(L) 3.0(L) 3.0(L)  Hemoglobin 13.0 - 18.0 g/dL 13.1 13.1 13.2  Hematocrit 40.0 - 52.0 % 38.6(L) 37.9(L) 37.4(L)  Platelets 150 - 440 K/uL 81(L) 76(L) 76(L)    CMP Latest Ref Rng & Units 03/02/2018 05/19/2017 09/14/2016  Glucose 70 - 99 mg/dL 87 100(H) 108(H)  BUN 8 - 23 mg/dL 11 11 14   Creatinine 0.61 - 1.24 mg/dL 0.80 0.68 0.62  Sodium 135 - 145 mmol/L 139 137 136  Potassium 3.5 - 5.1 mmol/L 4.2 4.1 4.0  Chloride 98 - 111 mmol/L 103 100(L) 102  CO2 22 - 32 mmol/L 28 29 29   Calcium 8.9 - 10.3 mg/dL 9.3 8.9 8.9  Total Protein 6.5 - 8.1 g/dL 7.7 7.1 7.7  Total Bilirubin 0.3 - 1.2 mg/dL 0.8 0.6 0.8  Alkaline Phos 38 - 126 U/L 67 58 64  AST 15 - 41 U/L 23 27 20   ALT 0 - 44 U/L 17 17 13(L)    AFP has been followed:  6.4 on 03/20/2012, 4.7 on 11/09/2012, 3.2 on 12/13/2013, 3.6 on 11/06/2015, 3.1 on 09/14/2016, 3.5 on 05/19/2017, and 7.4 on 03/02/2018.  PATHOLOGY  Surgical Pathology Report 05/05/18 DIAGNOSIS:  A. LIVER MASS; ULTRASOUND-GUIDED BIOPSY:  - CYTOKERATIN 7 POSITIVE ADENOCARCINOMA WITH ABUNDANT NECROSIS, SEE  COMMENT.  Comment:  A panel of immunohistochemical stains was performed and the neoplastic  cells demonstrate the following pattern of immunoreactivity:  Cytokeratin 7: Positive  TTF-1: Negative  Napsin: Negative  CDX 2: Negative  HepPar1: Negative  A CD34 stain highlights normal endothelium; characteristic staining  pattern seen in [facility] is not identified. The  differential diagnosis  includes metastatic adenocarcinoma versus  primary intrahepatic  cholangiocarcinoma. Possible extrahepatic sites of tumororigin include  lung, upper GI, pancreas, and prostate. Recommend correlation with  follow-up imaging. These findings were discussed with Dr. Mike Gip on  05/11/2018.  -There is sufficient material in block A2 for ancillary molecular tests  if needed.  -IHC slides were prepared by [facility], [place], [place]. All controls stained appropriately.  -This test was developed and its performance characteristics determined  by [facility]. It has not been cleared or approved by the [place] Food and Drug  Administration. The FDA does not require this test to go through  premarket FDA review. This test is used for clinical purposes. It should  not be regarded as investigational or for research. This laboratory is  certified under the Clinical Laboratory Improvement Amendments (CLIA) as  qualified to perform high complexity clinical laboratory testing.  GROSS DESCRIPTION:  A. Labeled: Liver lesion biopsy  Received: In formalin  Tissue fragment(s): Multiple  Size: Aggregate, 4.7 x 0.1 x 0.1 cm  Description: Pink-tan cores and tissue fragments  Entirely submitted in two cassettes.  Final Diagnosis performed by Quay Burow, MD.  Electronically signed  05/11/2018 2:40:03PM        RADIOGRAPHIC STUDIES: I have personally reviewed the radiological images as listed and agreed with the findings in the report. Nm Pet Image Restag (ps) Skull Base To Thigh  Result Date: 06/02/2018 CLINICAL DATA:  Initial treatment strategy for liver mass. EXAM: NUCLEAR MEDICINE PET SKULL BASE TO THIGH TECHNIQUE: 10.8 mCi F-18 FDG was injected intravenously. Full-ring PET imaging was performed from the skull base to thigh after the radiotracer. CT data was obtained and used for attenuation correction and anatomic localization. Fasting blood glucose: 109  mg/dl COMPARISON:  Hepatic MRI 04/19/2018, PET-CT 11/24/2017 FINDINGS: Mediastinal blood pool activity: SUV max 1.84 NECK: No hypermetabolic lymph nodes in the neck. Incidental CT findings: none CHEST: LEFT apical nodule measures 6 mm decreased from 10 mm. No residual metabolic activity. No additional pulmonary nodularity. No hypermetabolic mediastinal lymph nodes. Incidental CT findings: Coronary artery calcification and aortic atherosclerotic calcification. ABDOMEN/PELVIS: Lesion in the central LEFT hepatic lobe (segment 4B) is hypermetabolic with SUV max equal 6.2. This corresponds to enhancing lesion on comparison MRI and hypodensity on comparison CT today. Lesion measures approximately 2.9 cm (image 145/3). Metabolic activity is relatively intense with SUV max equal 6.2. Activity clearly above background metabolic liver activity. Additional hypodense lesion in the dome of the liver measuring 12 mm is not hypermetabolic and corresponds to cyst on comparison MRI. Liver has a nodular contour consistent with cirrhosis. Gallstones noted. No hypermetabolic lymph nodes in the abdomen pelvis. Adrenal glands are normal. No abnormal activity in the bowel. No abnormal activity in the pancreas. Normal prostate. Incidental CT findings: Atherosclerotic calcification of the aorta. SKELETON: No focal hypermetabolic activity to suggest skeletal metastasis. Incidental CT findings: none IMPRESSION: 1. Solitary hypermetabolic lesion in the central LEFT hepatic lobe consistent with malignancy. 2. No metabolic activity remaining in LEFT apical pulmonary nodule which is reduced in size. 3. No evidence of metastatic adenopathy on skull base to thigh FDG PET scan. 4. Cirrhotic liver.  Cholelithiasis 5.  Aortic Atherosclerosis (ICD10-I70.0). Electronically Signed   By: Suzy Bouchard M.D.   On: 06/02/2018 14:15    PET 06/02/18 IMPRESSION: 1. Solitary hypermetabolic lesion in the central LEFT hepatic lobe consistent with  malignancy. 2. No metabolic activity remaining in LEFT apical pulmonary nodule which is reduced in size. 3. No evidence of  metastatic adenopathy on skull base to thigh FDG PET scan. 4. Cirrhotic liver.  Cholelithiasis 5.  Aortic Atherosclerosis (ICD10-I70.0).   MRI Liver 04/19/18 IMPRESSION: 1. 2.5 cm lesion in the medial segment left liver shows no central enhancement and irregular peripheral hypervascularity. Imaging features are highly suspicious for malignancy although not specific for hepatocellular carcinoma (LR-M). As such, metastatic disease is also consideration. 2. 8 mm non-cystic lesion the anterior liver cannot be definitively characterized but is concerning for second neoplastic lesion. 3. Hepatic cysts. 4. Cholelithiasis.   US Abdomen 03/17/18 IMPRESSION: 1. Possible mass within the RIGHT liver lobe, measuring 2.3 cm. Given the underlying cirrhosis, recommend further characterization with hepatic MRI to exclude neoplastic lesion. 2. Cirrhotic liver. 3. Mild splenomegaly. 4. Cholelithiasis without evidence of acute cholecystitis.   ASSESSMENT & PLAN:  John [name] is a 62 y.o. male with a history of Hep B, Liver Cirrhosis, Anxiety/depression, HTN, Epileptic/Seizures, COPD, and Peripheral Neuropathy, presented with screening discovered liver mass.   1.  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 -I reviewed imaging myself, and discussed the image findings and liver biopsy pathology results with him today in great details -Liver biopsy confirmed CK7 positive adenocarcinoma, as her IHC studies were negative, overall fever cholangiocarcinoma, however is a metastatic adenocarcinoma, especially upper GI, is not ruled out -He has not had EGD for 4 to 5 years, due to his recent melena, liver cirrhosis, and in the liver, I strongly encouraged him to have a repeated EGD in the next few weeks -he had LLL lung  cancer in 11/2017, which was discovered on CT scan, but never biopsied, he underwent SBRT treatment and had complete response on PET scan.  We discussed that although the biopsy of liver lesion was negative for TTF-1 and Naprosyn, metastatic lung cancer is also not completely ruled out, but it feels to be less likely. -Dr. Celedonio Miyamoto has ordered cancer tyoe ID molecular testing, which showed 90% possibility of pancreatobiliary origin, [place] cannot be ruled out, as a type of cancer including lung cancer are ruled out by 95% confidence. -I had discussed with our pathologist Dr. Saralyn Pilar today, he will request his liver biopsy slides to be sent to [facility], for second pathology review. -I will present his case in our GI tumor board this week. -I discussed the treatment for intrahepatic cholangiocarcinoma, with may need surgery.  I reviewed with our liver surgeon Dr. Barry Dienes, due to the patient's comorbidities, especially liver cirrhosis, moderate thrombocytopenia, he is certainly at high risk for surgical complications. I discussed that we have medication which can improve his pancytopenia before surgery to reduce his risk for bleeding. -If surgery is not an option, will refer him to IR for ablation  -Discussed the role of adjuvant Xeloda after surgery for cholangiocarcinoma -I will call him after the tumor board discussion and path review at [facility], then decide if will refer him to surgery and IR    2. hepatitis B and liver cirrhosis -He doesn't see a hepatologist nor an infectious disease physician, he has never received hepatitis B treatment. -will repeat Hep B S antigen, S antibody, core antibody, and a viral load  -I will likely refer him to liver clinic down the road    3. 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  -CBC from 05/05/2018 showed platelets ok 81K.   4. Left apical lung cancer stage IA, s/p  SBRT, COPD -PET scan on  11/24/2017 revealed a hypermetabolic 1.0 cm left apical pulmonary nodule favoring malignancy. Assuming non-small cell lung cancer this represents T1a N0 M0 disease (stage IA). -Underwent SBRT 01/05/18-529/19 -Managed by Dr. Mike Gip   PLAN:  -will request path review at [facility] -will discuss his case in our GI tumor conference this week -will contact his GI Dr. Vira Agar to get him in for EGD -lab today  -I will call him after the above, to finalize his treatment plan     No orders of the defined types were placed in this encounter.   All questions were answered. The patient knows to call the clinic with any problems, questions or concerns. I spent 50 minutes counseling the patient face to face. The total time spent in the appointment was 60 minutes and more than 50% was on counseling.     [name], MD 06/19/2018 3:12 PM   I, Carmon Sails am acting as scribe for Dr. [name].  I have reviewed the above documentation for accuracy and completeness, and I agree with the above.

## 2018-06-20 ENCOUNTER — Encounter: Payer: Self-pay | Admitting: Hematology

## 2018-06-20 LAB — HEPATITIS B DNA, ULTRAQUANTITATIVE, PCR
HBV DNA SERPL PCR-ACNC: NOT DETECTED IU/mL
HBV DNA SERPL PCR-LOG IU: UNDETERMINED {Log_IU}/mL

## 2018-06-20 LAB — HEPATITIS B CORE ANTIBODY, TOTAL: Hep B Core Total Ab: POSITIVE — AB

## 2018-06-20 LAB — AFP TUMOR MARKER: AFP, SERUM, TUMOR MARKER: 60 ng/mL — AB (ref 0.0–8.3)

## 2018-06-20 LAB — HEPATITIS B SURFACE ANTIGEN: HEP B S AG: NEGATIVE

## 2018-06-20 LAB — HEPATITIS B SURFACE ANTIBODY,QUALITATIVE: HEP B S AB: REACTIVE

## 2018-06-20 LAB — CANCER ANTIGEN 19-9: CA 19-9: 47 U/mL — ABNORMAL HIGH (ref 0–35)

## 2018-06-21 ENCOUNTER — Telehealth: Payer: Self-pay

## 2018-06-21 NOTE — Telephone Encounter (Signed)
Faxed OV note to Dr. Vira Agar at [facility] requested he call Dr. Burr Medico on her cell.

## 2018-06-21 NOTE — Addendum Note (Signed)
Addended by: [name] on: 06/21/2018 03:32 PM   Modules accepted: Orders

## 2018-06-22 ENCOUNTER — Encounter: Payer: Self-pay | Admitting: *Deleted

## 2018-06-22 NOTE — Progress Notes (Signed)
  Oncology Nurse Navigator Documentation  Called patient to introduce myself and the role of nurse navigator. I advised patient that he would be getting a call from [facility] for surgical apt with Dr. Barry Dienes. No barriers to care identified. Will continue to follow as needed.

## 2018-06-23 ENCOUNTER — Encounter
Admission: RE | Disposition: A | Discharge status: Home / Self Care | Payer: Self-pay | Source: Ambulatory Visit | Attending: Unknown Physician Specialty

## 2018-06-23 ENCOUNTER — Encounter: Payer: Self-pay | Admitting: Anesthesiology

## 2018-06-23 ENCOUNTER — Ambulatory Visit: Payer: Medicare PPO | Admitting: Anesthesiology

## 2018-06-23 ENCOUNTER — Ambulatory Visit
Admission: RE | Admit: 2018-06-23 | Discharge: 2018-06-23 | Disposition: A | Discharge status: Home / Self Care | Payer: Medicare PPO | Source: Ambulatory Visit | Attending: Unknown Physician Specialty | Admitting: Unknown Physician Specialty

## 2018-06-23 DIAGNOSIS — J449 Chronic obstructive pulmonary disease, unspecified: Secondary | ICD-10-CM | POA: Diagnosis not present

## 2018-06-23 DIAGNOSIS — K7460 Unspecified cirrhosis of liver: Secondary | ICD-10-CM | POA: Diagnosis not present

## 2018-06-23 DIAGNOSIS — Z885 Allergy status to narcotic agent status: Secondary | ICD-10-CM | POA: Diagnosis not present

## 2018-06-23 DIAGNOSIS — F329 Major depressive disorder, single episode, unspecified: Secondary | ICD-10-CM | POA: Diagnosis not present

## 2018-06-23 DIAGNOSIS — G621 Alcoholic polyneuropathy: Secondary | ICD-10-CM | POA: Diagnosis not present

## 2018-06-23 DIAGNOSIS — Z87891 Personal history of nicotine dependence: Secondary | ICD-10-CM | POA: Insufficient documentation

## 2018-06-23 DIAGNOSIS — M109 Gout, unspecified: Secondary | ICD-10-CM | POA: Diagnosis not present

## 2018-06-23 DIAGNOSIS — K3189 Other diseases of stomach and duodenum: Secondary | ICD-10-CM | POA: Diagnosis not present

## 2018-06-23 DIAGNOSIS — Z79899 Other long term (current) drug therapy: Secondary | ICD-10-CM | POA: Insufficient documentation

## 2018-06-23 DIAGNOSIS — G40909 Epilepsy, unspecified, not intractable, without status epilepticus: Secondary | ICD-10-CM | POA: Insufficient documentation

## 2018-06-23 DIAGNOSIS — K2970 Gastritis, unspecified, without bleeding: Secondary | ICD-10-CM | POA: Diagnosis not present

## 2018-06-23 DIAGNOSIS — Z8619 Personal history of other infectious and parasitic diseases: Secondary | ICD-10-CM | POA: Diagnosis not present

## 2018-06-23 DIAGNOSIS — K766 Portal hypertension: Secondary | ICD-10-CM | POA: Insufficient documentation

## 2018-06-23 DIAGNOSIS — K921 Melena: Secondary | ICD-10-CM | POA: Insufficient documentation

## 2018-06-23 DIAGNOSIS — K219 Gastro-esophageal reflux disease without esophagitis: Secondary | ICD-10-CM | POA: Insufficient documentation

## 2018-06-23 DIAGNOSIS — Z8711 Personal history of peptic ulcer disease: Secondary | ICD-10-CM | POA: Diagnosis not present

## 2018-06-23 DIAGNOSIS — Z8505 Personal history of malignant neoplasm of liver: Secondary | ICD-10-CM | POA: Diagnosis not present

## 2018-06-23 DIAGNOSIS — M4726 Other spondylosis with radiculopathy, lumbar region: Secondary | ICD-10-CM | POA: Diagnosis not present

## 2018-06-23 DIAGNOSIS — Z888 Allergy status to other drugs, medicaments and biological substances status: Secondary | ICD-10-CM | POA: Insufficient documentation

## 2018-06-23 DIAGNOSIS — Z8249 Family history of ischemic heart disease and other diseases of the circulatory system: Secondary | ICD-10-CM | POA: Insufficient documentation

## 2018-06-23 HISTORY — PX: ESOPHAGOGASTRODUODENOSCOPY (EGD) WITH PROPOFOL: SHX5813

## 2018-06-23 SURGERY — ESOPHAGOGASTRODUODENOSCOPY (EGD) WITH PROPOFOL
Anesthesia: General

## 2018-06-23 MED ORDER — PROPOFOL 500 MG/50ML IV EMUL
INTRAVENOUS | Status: DC | PRN
  Administered 2018-06-23: 50 ug/kg/min via INTRAVENOUS

## 2018-06-23 MED ORDER — GLYCOPYRROLATE 0.2 MG/ML IJ SOLN
INTRAMUSCULAR | Status: DC | PRN
  Administered 2018-06-23: 0.2 mg via INTRAVENOUS

## 2018-06-23 MED ORDER — FENTANYL CITRATE (PF) 100 MCG/2ML IJ SOLN
INTRAMUSCULAR | Status: AC
  Filled 2018-06-23: qty 2

## 2018-06-23 MED ORDER — GLYCOPYRROLATE 0.2 MG/ML IJ SOLN
INTRAMUSCULAR | Status: AC
  Filled 2018-06-23: qty 1

## 2018-06-23 MED ORDER — MIDAZOLAM HCL 5 MG/5ML IJ SOLN
INTRAMUSCULAR | Status: DC | PRN
  Administered 2018-06-23: 2 mg via INTRAVENOUS

## 2018-06-23 MED ORDER — MIDAZOLAM HCL 2 MG/2ML IJ SOLN
INTRAMUSCULAR | Status: AC
  Filled 2018-06-23: qty 2

## 2018-06-23 MED ORDER — SODIUM CHLORIDE 0.9 % IV SOLN: INTRAVENOUS | Status: DC

## 2018-06-23 MED ORDER — SODIUM CHLORIDE 0.9 % IV SOLN
INTRAVENOUS | Status: DC
  Administered 2018-06-23: 1000 mL via INTRAVENOUS

## 2018-06-23 MED ORDER — FENTANYL CITRATE (PF) 100 MCG/2ML IJ SOLN
INTRAMUSCULAR | Status: DC | PRN
  Administered 2018-06-23: 25 ug via INTRAVENOUS

## 2018-06-23 MED ORDER — LIDOCAINE HCL (PF) 2 % IJ SOLN
INTRAMUSCULAR | Status: DC | PRN
  Administered 2018-06-23: 100 mg

## 2018-06-23 MED ORDER — PROPOFOL 10 MG/ML IV BOLUS
INTRAVENOUS | Status: DC | PRN
  Administered 2018-06-23 (×2): 20 mg via INTRAVENOUS

## 2018-06-23 MED ORDER — LIDOCAINE HCL (PF) 2 % IJ SOLN
INTRAMUSCULAR | Status: AC
  Filled 2018-06-23: qty 10

## 2018-06-23 MED ORDER — PROPOFOL 500 MG/50ML IV EMUL
INTRAVENOUS | Status: AC
  Filled 2018-06-23: qty 50

## 2018-06-23 NOTE — Op Note (Signed)
[facility] Gastroenterology Patient Name: John [name] Procedure Date: 06/23/2018 9:06 AM MRN: [id number] Account #: [id number] Date of Birth: 1955/11/29 Admit Type: Outpatient Age: 62 Room: [facility] ENDO ROOM 1 Gender: Male Note Status: Finalized Procedure:            Upper GI endoscopy Indications:          Melena, Suspected portal hypertensive gastropathy Providers:            Manya Silvas, MD Referring MD:         Cletis Athens, MD (Referring MD) Medicines:            Propofol per Anesthesia Complications:        No immediate complications. Procedure:            Pre-Anesthesia Assessment:                       - After reviewing the risks and benefits, the patient                        was deemed in satisfactory condition to undergo the                        procedure.                       After obtaining informed consent, the endoscope was                        passed under direct vision. Throughout the procedure,                        the patient's blood pressure, pulse, and oxygen                        saturations were monitored continuously. The Endoscope                        was introduced through the mouth, and advanced to the                        second part of duodenum. The upper GI endoscopy was                        accomplished without difficulty. The patient tolerated                        the procedure well. Findings:      There were esophageal mucosal changes suspicious for short-segment       Barrett's esophagus present in the lower third of the esophagus. The       maximum longitudinal extent of these mucosal changes was 2 cm in length.       Due to cirrhosis no biopsdies done as the mucosa looks reasonable good.      Mild portal hypertensive gastropathy was found in the proximal stomach.      Localized mild inflammation characterized by erythema and granularity       was found in the gastric antrum.      The examined duodenum  was normal. Impression:           - Esophageal mucosal changes suspicious for  short-segment Barrett's esophagus.                       - Portal hypertensive gastropathy.                       - Gastritis.                       - Normal examined duodenum.                       - No specimens collected. Recommendation:       - The findings and recommendations were discussed with                        the patient's family. Manya Silvas, MD 06/23/2018 9:28:46 AM This report has been signed electronically. Number of Addenda: 0 Note Initiated On: 06/23/2018 9:06 AM      [facility]

## 2018-06-23 NOTE — Anesthesia Preprocedure Evaluation (Addendum)
Anesthesia Evaluation  Patient identified by MRN, date of birth, ID band Patient awake    Reviewed: Allergy & Precautions, NPO status , Patient's Chart, lab work & pertinent test results, reviewed documented beta blocker date and time   Airway Mallampati: III  TM Distance: >3 FB     Dental  (+) Chipped, Poor Dentition, Missing   Pulmonary sleep apnea , COPD, former smoker,           Cardiovascular hypertension,      Neuro/Psych Seizures -,  PSYCHIATRIC DISORDERS Anxiety Depression  Neuromuscular disease    GI/Hepatic PUD, GERD  ,(+) Hepatitis -  Endo/Other    Renal/GU      Musculoskeletal  (+) Arthritis ,   Abdominal   Peds  Hematology   Anesthesia Other Findings  ETOH. Smokes. Gout. Liver Ca. Decreased plat. EKG ok. Denies brain tumor hx. Denies sleep apnea.  Reproductive/Obstetrics                            Anesthesia Physical Anesthesia Plan  ASA: III  Anesthesia Plan: General   Post-op Pain Management:    Induction: Intravenous  PONV Risk Score and Plan:   Airway Management Planned:   Additional Equipment:   Intra-op Plan:   Post-operative Plan:   Informed Consent: I have reviewed the patients History and Physical, chart, labs and discussed the procedure including the risks, benefits and alternatives for the proposed anesthesia with the patient or authorized representative who has indicated his/her understanding and acceptance.     Plan Discussed with: CRNA  Anesthesia Plan Comments:         Anesthesia Quick Evaluation

## 2018-06-23 NOTE — Transfer of Care (Signed)
Immediate Anesthesia Transfer of Care Note  Patient: John [name]  Procedure(s) Performed: ESOPHAGOGASTRODUODENOSCOPY (EGD) WITH PROPOFOL (N/A )  Patient Location: PACU  Anesthesia Type:General  Level of Consciousness: sedated  Airway & Oxygen Therapy: Patient Spontanous Breathing and Patient connected to nasal cannula oxygen  Post-op Assessment: Report given to RN and Post -op Vital signs reviewed and stable  Post vital signs: Reviewed and stable  Last Vitals:  Vitals Value Taken Time  BP    Temp    Pulse 90 06/23/2018  9:26 AM  Resp 12 06/23/2018  9:26 AM  SpO2 100 % 06/23/2018  9:26 AM  Vitals shown include unvalidated device data.  Last Pain:  Vitals:   06/23/18 0825  TempSrc: Tympanic  PainSc: 0-No pain         Complications: No apparent anesthesia complications

## 2018-06-23 NOTE — Anesthesia Postprocedure Evaluation (Signed)
Anesthesia Post Note  Patient: John [name]  Procedure(s) Performed: ESOPHAGOGASTRODUODENOSCOPY (EGD) WITH PROPOFOL (N/A )  Patient location during evaluation: Endoscopy Anesthesia Type: General Level of consciousness: awake and alert Pain management: pain level controlled Vital Signs Assessment: post-procedure vital signs reviewed and stable Respiratory status: spontaneous breathing, nonlabored ventilation, respiratory function stable and patient connected to nasal cannula oxygen Cardiovascular status: blood pressure returned to baseline and stable Postop Assessment: no apparent nausea or vomiting Anesthetic complications: no     Last Vitals:  Vitals:   06/23/18 0955 06/23/18 1005  BP: 128/84 (!) 123/98  Pulse: (!) 49 (!) 52  Resp: 14 11  Temp:    SpO2: 97% 100%    Last Pain:  Vitals:   06/23/18 1005  TempSrc:   PainSc: 0-No pain                 Hildred Mollica S

## 2018-06-23 NOTE — Anesthesia Post-op Follow-up Note (Signed)
Anesthesia QCDR form completed.        

## 2018-06-23 NOTE — H&P (Signed)
Primary Care Physician:  Cletis Athens, MD Primary Gastroenterologist:  Dr. Vira Agar  Pre-Procedure History & Physical: HPI:  John [name] is a 62 y.o. male is here for a spell of melena 2 weeks ago and cirrhosis of the liver. endoscopy.   Past Medical History:  Diagnosis Date  . Abnormal MRI, lumbar spine (2015) 01/21/2016   FINDINGS:  L3-4: Tiny right foraminal and extra foraminal disc bulge adjacent to but not compressing the L3 nerve lateral to the neural foramen. L4-5: There is a small broad-based disc bulge. There is also hypertrophy of the ligamentum flavum and facet joints, left more than right creating moderately severe spinal stenosis and bilateral lateral recess stenosis, left greater than right. This appe  . Anxiety   . Back ache 05/06/2014  . Cervical pain 05/06/2014  . 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[place]) 06/23/2015  . COPD (chronic obstructive pulmonary disease) ([place])   . De Quervain's tenosynovitis, left 07/07/2017  . Depression   . Dystonia   . Epileptic disorder ([place]) 06/23/2015  . Extremity pain 05/06/2014  . Febrile seizures ([place])    child  . Foot pain 09/11/2014  . Gastric ulcer 06/23/2015  . Gout   . H/O neoplasm 06/23/2015  . Head injury 06/23/2015  . Hemorrhoids   . Hepatic cirrhosis ([place]) 06/23/2015  . Hepatitis B   . History of GI bleed   . Leukopenia   . Neurogenic pain 01/21/2016  . Nodule of upper lobe of left lung 12/02/2017  . Obstructive sleep apnea 06/23/2015  . 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 Osteoarthritis of spine with radiculopathy, lumbosacral region 06/23/2015  . Peripheral neuropathy 06/23/2015  . Peripheral neuropathy (Al[place]) 16/09/930  . Personal history of tobacco use, presenting hazards to health 01/09/2016  . Pulse irregularity   . Seizures ([place])   . Spinal stenosis   . Splenomegaly 05/30/2013  . Thrombocytopenia ([place]) 06/23/2015  . Uncomplicated opioid dependence ([place]) 06/23/2015     Past Surgical History:  Procedure Laterality Date  . ESOPHAGOGASTRODUODENOSCOPY  11/2013  . surgery for cervical neck fracture    . TOOTH EXTRACTION Right 09/28/2016    Prior to Admission medications   Medication Sig Start Date End Date Taking? Authorizing Provider  ALPRAZolam Duanne Moron) 0.25 MG tablet Take 0.25 mg by mouth at bedtime as needed for anxiety.   Yes [provider]  baclofen (LIORESAL) 10 MG tablet Take 1 tablet (10 mg total) by mouth 3 (three) times daily. Patient taking differently: Take 10 mg by mouth 2 (two) times daily.  04/21/18 07/20/18 Yes Vevelyn Francois, NP  cetirizine (ZYRTEC) 10 MG tablet Take 10 mg by mouth daily. 11/09/17  Yes [provider]  folic acid (FOLVITE) 1 MG tablet Take 1 mg by mouth daily.    Yes [provider]  gabapentin (NEURONTIN) 600 MG tablet Take 1 tablet (600 mg total) by mouth 3 (three) times daily. 04/21/18 07/20/18 Yes Vevelyn Francois, NP  Magnesium Oxide 500 MG CAPS Take 1 capsule (500 mg total) by mouth 2 (two) times daily at 8 am and 10 pm. 04/19/17 06/08/18  Vevelyn Francois, NP  oxyCODONE (OXY IR/ROXICODONE) 5 MG immediate release tablet Take 0.5 tablets (2.5 mg total) by mouth 2 (two) times daily. 04/21/18 06/08/18  Vevelyn Francois, NP    Allergies as of 06/22/2018 - Review Complete 06/22/2018  Allergen Reaction Noted  . Codeine Nausea Only 05/06/2014  . Codeine sulfate Nausea Only 03/20/2015  . Morphine Nausea Only  03/20/2015  . Duloxetine Rash 03/20/2015    Family History  Problem Relation Age of Onset  . Stroke Mother   . Cancer Father 74       prostate cancer  . Heart disease Father   . Stroke Father   . Cancer Brother 60       Lung Cancer  . Cancer Maternal Aunt        breast cancer   . Cancer Cousin        breast cancer    Social History   Socioeconomic History  . Marital status: Single    Spouse name: Not on file  . Number of children: Not on file  . Years of education: Not on file  .  Highest education level: Not on file  Occupational History  . Not on file  Social Needs  . Financial resource strain: Not on file  . Food insecurity:    Worry: Not on file    Inability: Not on file  . Transportation needs:    Medical: Not on file    Non-medical: Not on file  Tobacco Use  . Smoking status: Former Smoker    Packs/day: 0.50    Years: 35.00    Pack years: 17.50    Types: Cigarettes    Start date: 10/07/2017    Last attempt to quit: 10/07/2017    Years since quitting: 0.7  . Smokeless tobacco: Current User    Types: Snuff  Substance and Sexual Activity  . Alcohol use: No    Alcohol/week: 0.0 standard drinks    Comment: Pt used to drink alcohol for 30 years, states that he has been alcohol free since July 2013...  . Drug use: No  . Sexual activity: Not on file  Lifestyle  . Physical activity:    Days per week: Not on file    Minutes per session: Not on file  . Stress: Not on file  Relationships  . Social connections:    Talks on phone: Not on file    Gets together: Not on file    Attends religious service: Not on file    Active member of club or organization: Not on file    Attends meetings of clubs or organizations: Not on file    Relationship status: Not on file  . Intimate partner violence:    Fear of current or ex partner: Not on file    Emotionally abused: Not on file    Physically abused: Not on file    Forced sexual activity: Not on file  Other Topics Concern  . Not on file  Social History Narrative  . Not on file    Review of Systems: See HPI, otherwise negative ROS  Physical Exam: BP (!) 114/46   Pulse (!) 54   Temp (!) 96.6 F (35.9 C) (Tympanic)   Resp 20   Ht 6' (1.829 m)   Wt 88.5 kg   SpO2 99%   BMI 26.45 kg/m  General:   Alert,  pleasant and cooperative in NAD Head:  Normocephalic and atraumatic. Neck:  Supple; no masses or thyromegaly. Lungs:  Clear throughout to auscultation.    Heart:  Regular rate and rhythm. Abdomen:   Soft, nontender and nondistended. Normal bowel sounds, without guarding, and without rebound.   Neurologic:  Alert and  oriented x4;  grossly normal neurologically.  Impression/Plan: John [name] is here for an endoscopy to be performed for Melena and metastatic cancer.  Risks, benefits, limitations, and  alternatives regarding  endoscopy have been reviewed with the patient.  Questions have been answered.  All parties agreeable.   Gaylyn Cheers, MD  06/23/2018, 9:06 AM

## 2018-06-26 ENCOUNTER — Encounter: Payer: Self-pay | Admitting: Unknown Physician Specialty

## 2018-06-28 ENCOUNTER — Telehealth: Payer: Self-pay

## 2018-06-28 NOTE — Progress Notes (Signed)
  Oncology Nurse Navigator Documentation  Navigator Location: [facility] (06/28/18 1323)   )Navigator Encounter Type: Telephone (06/28/18 1323) Telephone: Lahoma Crocker Call;Appt Confirmation/Clarification (06/28/18 1323)   Wandra Feinstein and [facility] for soonest appointment for surgical referral.  Patient is scheduled with Dr. Shon Hough at [facility] on 07/07/18 @ 11:30. They will send him a welcome packet with directions. Called patient to let him know. He voiced appreciation.                                      Acuity: Level 3 (06/28/18 1323)         Time Spent with Patient: 15 (06/28/18 1323)

## 2018-06-28 NOTE — Progress Notes (Signed)
Sent request to radiology to push imaging to [facility] for [facility] referral.

## 2018-06-28 NOTE — Telephone Encounter (Signed)
Patient calls stating that his appointment with [facility] is not until 11/4.  He is upset because he feels like it needs to be sooner.  Requesting Dr. Burr Medico to call him.

## 2018-07-07 DIAGNOSIS — C221 Intrahepatic bile duct carcinoma: Secondary | ICD-10-CM | POA: Insufficient documentation

## 2018-07-08 ENCOUNTER — Telehealth: Payer: Self-pay | Admitting: Hematology

## 2018-07-08 NOTE — Telephone Encounter (Signed)
Pt had an appointment with surgeon Dr. Zenia Resides at [facility] yesterday. I called him today to f/u his treatment plan, left a VM. His biopsy path was reviewed by pathologist Dr. 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 I encouraged him to call me back if he has any questions, or if surgery is not offered.   [name]  07/08/2018

## 2018-07-10 ENCOUNTER — Other Ambulatory Visit: Payer: Self-pay

## 2018-07-10 ENCOUNTER — Ambulatory Visit: Payer: Medicare PPO | Attending: Nurse Practitioner | Admitting: Nurse Practitioner

## 2018-07-10 ENCOUNTER — Encounter: Payer: Self-pay | Admitting: Nurse Practitioner

## 2018-07-10 VITALS — BP 118/60 | HR 65 | Temp 97.6°F | Ht 72.0 in | Wt 198.0 lb

## 2018-07-10 DIAGNOSIS — J449 Chronic obstructive pulmonary disease, unspecified: Secondary | ICD-10-CM | POA: Diagnosis not present

## 2018-07-10 DIAGNOSIS — M48061 Spinal stenosis, lumbar region without neurogenic claudication: Secondary | ICD-10-CM | POA: Insufficient documentation

## 2018-07-10 DIAGNOSIS — M47812 Spondylosis without myelopathy or radiculopathy, cervical region: Secondary | ICD-10-CM

## 2018-07-10 DIAGNOSIS — M549 Dorsalgia, unspecified: Secondary | ICD-10-CM | POA: Diagnosis not present

## 2018-07-10 DIAGNOSIS — Z87891 Personal history of nicotine dependence: Secondary | ICD-10-CM | POA: Insufficient documentation

## 2018-07-10 DIAGNOSIS — Z8249 Family history of ischemic heart disease and other diseases of the circulatory system: Secondary | ICD-10-CM | POA: Diagnosis not present

## 2018-07-10 DIAGNOSIS — F419 Anxiety disorder, unspecified: Secondary | ICD-10-CM | POA: Diagnosis not present

## 2018-07-10 DIAGNOSIS — G5600 Carpal tunnel syndrome, unspecified upper limb: Secondary | ICD-10-CM | POA: Insufficient documentation

## 2018-07-10 DIAGNOSIS — M7918 Myalgia, other site: Secondary | ICD-10-CM | POA: Diagnosis not present

## 2018-07-10 DIAGNOSIS — G4733 Obstructive sleep apnea (adult) (pediatric): Secondary | ICD-10-CM | POA: Diagnosis not present

## 2018-07-10 DIAGNOSIS — Z885 Allergy status to narcotic agent status: Secondary | ICD-10-CM | POA: Insufficient documentation

## 2018-07-10 DIAGNOSIS — G894 Chronic pain syndrome: Secondary | ICD-10-CM | POA: Diagnosis not present

## 2018-07-10 DIAGNOSIS — M4727 Other spondylosis with radiculopathy, lumbosacral region: Secondary | ICD-10-CM | POA: Diagnosis not present

## 2018-07-10 DIAGNOSIS — M4722 Other spondylosis with radiculopathy, cervical region: Secondary | ICD-10-CM | POA: Insufficient documentation

## 2018-07-10 DIAGNOSIS — K219 Gastro-esophageal reflux disease without esophagitis: Secondary | ICD-10-CM | POA: Diagnosis not present

## 2018-07-10 DIAGNOSIS — Z5181 Encounter for therapeutic drug level monitoring: Secondary | ICD-10-CM | POA: Diagnosis present

## 2018-07-10 DIAGNOSIS — M47816 Spondylosis without myelopathy or radiculopathy, lumbar region: Secondary | ICD-10-CM | POA: Insufficient documentation

## 2018-07-10 DIAGNOSIS — F329 Major depressive disorder, single episode, unspecified: Secondary | ICD-10-CM | POA: Diagnosis not present

## 2018-07-10 DIAGNOSIS — Z79899 Other long term (current) drug therapy: Secondary | ICD-10-CM | POA: Diagnosis not present

## 2018-07-10 DIAGNOSIS — Z8719 Personal history of other diseases of the digestive system: Secondary | ICD-10-CM | POA: Insufficient documentation

## 2018-07-10 MED ORDER — OXYCODONE HCL 5 MG PO TABS: 2.5000 mg | ORAL_TABLET | Freq: Two times a day (BID) | ORAL | 0 refills | Status: DC

## 2018-07-10 MED ORDER — BACLOFEN 10 MG PO TABS: 10.0000 mg | ORAL_TABLET | Freq: Three times a day (TID) | ORAL | 0 refills | Status: DC

## 2018-07-10 MED ORDER — GABAPENTIN 600 MG PO TABS: 600.0000 mg | ORAL_TABLET | Freq: Three times a day (TID) | ORAL | 0 refills | Status: DC

## 2018-07-10 MED ORDER — MAGNESIUM OXIDE -MG SUPPLEMENT 500 MG PO CAPS: 1.0000 | ORAL_CAPSULE | Freq: Two times a day (BID) | ORAL | 0 refills | Status: DC

## 2018-07-10 NOTE — Progress Notes (Signed)
Nursing Pain Medication Assessment:  Safety precautions to be maintained throughout the outpatient stay will include: orient to surroundings, keep bed in low position, maintain call bell within reach at all times, provide assistance with transfer out of bed and ambulation.  Medication Inspection Compliance: Pill count conducted under aseptic conditions, in front of the patient. Neither the pills nor the bottle was removed from the patient's sight at any time. Once count was completed pills were immediately returned to the patient in their original bottle.  Medication: Oxycodone IR Pill/Patch Count: 16 of 30 pills remain Pill/Patch Appearance: Markings consistent with prescribed medication Bottle Appearance: Standard pharmacy container. Clearly labeled. Filled Date: 10 / 19 / 2019 Last Medication intake:  Today

## 2018-07-10 NOTE — Patient Instructions (Signed)
____________________________________________________________________________________________  Medication Rules  Applies to: All patients receiving prescriptions (written or electronic).  Pharmacy of record: Pharmacy where electronic prescriptions will be sent. If written prescriptions are taken to a different pharmacy, please inform the nursing staff. The pharmacy listed in the electronic medical record should be the one where you would like electronic prescriptions to be sent.  Prescription refills: Only during scheduled appointments. Applies to both, written and electronic prescriptions.  NOTE: The following applies primarily to controlled substances (Opioid* Pain Medications).   Patient's responsibilities: 1. Pain Pills: Bring all pain pills to every appointment (except for procedure appointments). 2. Pill Bottles: Bring pills in original pharmacy bottle. Always bring newest bottle. Bring bottle, even if empty. 3. Medication refills: You are responsible for knowing and keeping track of what medications you need refilled. The day before your appointment, write a list of all prescriptions that need to be refilled. Bring that list to your appointment and give it to the admitting nurse. Prescriptions will be written only during appointments. If you forget a medication, it will not be "Called in", "Faxed", or "electronically sent". You will need to get another appointment to get these prescribed. 4. Prescription Accuracy: You are responsible for carefully inspecting your prescriptions before leaving our office. Have the discharge nurse carefully go over each prescription with you, before taking them home. Make sure that your name is accurately spelled, that your address is correct. Check the name and dose of your medication to make sure it is accurate. Check the number of pills, and the written instructions to make sure they are clear and accurate. Make sure that you are given enough medication to last  until your next medication refill appointment. 5. Taking Medication: Take medication as prescribed. Never take more pills than instructed. Never take medication more frequently than prescribed. Taking less pills or less frequently is permitted and encouraged, when it comes to controlled substances (written prescriptions).  6. Inform other Doctors: Always inform, all of your healthcare providers, of all the medications you take. 7. Pain Medication from other Providers: You are not allowed to accept any additional pain medication from any other Doctor or Healthcare provider. There are two exceptions to this rule. (see below) In the event that you require additional pain medication, you are responsible for notifying us, as stated below. 8. Medication Agreement: You are responsible for carefully reading and following our Medication Agreement. This must be signed before receiving any prescriptions from our practice. Safely store a copy of your signed Agreement. Violations to the Agreement will result in no further prescriptions. (Additional copies of our Medication Agreement are available upon request.) 9. Laws, Rules, & Regulations: All patients are expected to follow all Federal and State Laws, Statutes, Rules, & Regulations. Ignorance of the Laws does not constitute a valid excuse. The use of any illegal substances is prohibited. 10. Adopted CDC guidelines & recommendations: Target dosing levels will be at or below 60 MME/day. Use of benzodiazepines** is not recommended.  Exceptions: There are only two exceptions to the rule of not receiving pain medications from other Healthcare Providers. 1. Exception #1 (Emergencies): In the event of an emergency (i.e.: accident requiring emergency care), you are allowed to receive additional pain medication. However, you are responsible for: As soon as you are able, call our office [phone], at any time of the day or night, and leave a message stating your name, the  date and nature of the emergency, and the name and dose of the medication   prescribed. In the event that your call is answered by a member of our staff, make sure to document and save the date, time, and the name of the person that took your information.  2. Exception #2 (Planned Surgery): In the event that you are scheduled by another doctor or dentist to have any type of surgery or procedure, you are allowed (for a period no longer than 30 days), to receive additional pain medication, for the acute post-op pain. However, in this case, you are responsible for picking up a copy of our "Post-op Pain Management for Surgeons" handout, and giving it to your surgeon or dentist. This document is available at our office, and does not require an appointment to obtain it. Simply go to our office during business hours (Monday-Thursday from 8:00 AM to 4:00 PM) (Friday 8:00 AM to 12:00 Noon) or if you have a scheduled appointment with us, prior to your surgery, and ask for it by name. In addition, you will need to provide us with your name, name of your surgeon, type of surgery, and date of procedure or surgery.  *Opioid medications include: morphine, codeine, oxycodone, oxymorphone, hydrocodone, hydromorphone, meperidine, tramadol, tapentadol, buprenorphine, fentanyl, methadone. **Benzodiazepine medications include: diazepam (Valium), alprazolam (Xanax), clonazepam (Klonopine), lorazepam (Ativan), clorazepate (Tranxene), chlordiazepoxide (Librium), estazolam (Prosom), oxazepam (Serax), temazepam (Restoril), triazolam (Halcion) (Last updated: 11/03/2017) ____________________________________________________________________________________________    

## 2018-07-10 NOTE — Progress Notes (Signed)
Patient's Name: John [name]  MRN: [id number]  Referring Provider: Cletis Athens, MD  DOB: 06/04/1956  PCP: Cletis Athens, MD  DOS: 07/10/2018  Note by: Vevelyn Francois NP  Service setting: Ambulatory outpatient  Specialty: Interventional Pain Management  Location: [facility] (AMB) Pain Management Facility    Patient type: Established    Primary Reason(s) for Visit: Encounter for prescription drug management. (Level of risk: moderate)  CC: Back Pain  HPI  John [name] is a 62 y.o. year old, male patient, who comes today for a medication management evaluation. He has Encounter for therapeutic drug level monitoring; Long term current use of opiate analgesic; Opiate use (7.5 MME/Day); Lumbar spondylosis; Chronic low back pain (Location of Primary Source of Pain) (Bilateral) (L>R); IVP/radiological dye Allergy; Thrombocytopenia ([place]); History of alcoholism; Carpal tunnel syndrome; Depression; Difficulty in walking; Dystonia; Gastric ulcer; HCV (hepatitis C virus); H/O neoplasm; BP (high blood pressure); Decreased leukocytes; Hepatic cirrhosis ([place]); Loss of feeling or sensation; Absence of sensation; Current tobacco use; Has a tremor; Osteoarthritis of spine with radiculopathy, lumbosacral region; Peripheral neuropathy (Alcoholic); Chronic neck pain (Location of Tertiary source of pain) (Bilateral) (R>L); Cervical spondylosis; Obstructive sleep apnea; History of panic attacks; GERD (gastroesophageal reflux disease); Chronic constipation; 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[place]); Leukopenia; Lumbar facet syndrome (Location of Primary Source of Pain) (Bilateral) (L>R); Chronic lumbar radicular pain (Right S1 and Left L5) (Location of Secondary source of pain) (Bilateral) (L>R); Cervical radicular pain (Location of Tertiary source of pain) (Bilateral) (R>L); Chronic obstructive pulmonary disease (COPD) ([place]); Head injury; Epileptic disorder ([place]); Long term prescription opiate use; Encounter for chronic pain management;  Radicular pain of shoulder (Bilateral) (R>L); Opioid-induced constipation ([place]); Personal history of tobacco use, presenting hazards to health; Neurogenic pain; Musculoskeletal pain; Chronic lower extremity pain (Location of Secondary source of pain) (Bilateral) (L>R); Chronic upper extremity pain (Location of Tertiary source of pain) (Bilateral) (R>L); Abnormal MRI, lumbar spine (2015); Lumbar central spinal stenosis (Severe at L4-5; L5-S1); Lumbar lateral recess stenosis (Bilateral L>R at L4-5; Left sided at L5-S1); Lumbar foraminal stenosis (Left L5-S1); Lumbar facet hypertrophy (L4-5 & L5-S1 L>R); Abnormal MRI, cervical spine (2014); Cervical foraminal stenosis (Right sided C3-4 & C4-5) (Left-sided C5-6 and C7-T1) (Bilateral C6-7); Splenomegaly; Chronic pain syndrome; Chronic thumb pain (Left); Bilateral carpal tunnel syndrome; Foot pain, right; History of brain tumor; De Quervain's tenosynovitis, left; Nodule of upper lobe of left lung; Goals of care, counseling/discussion; Back pain; Liver mass, right lobe; and Cholangiocarcinoma (HCC) on their problem list. His primarily concern today is the Back Pain  Pain Assessment: Location: Lower Back Radiating: pain radiaties down buttock and legs Onset: More than a month ago Duration: Chronic pain Quality: Dull, Constant Severity: 3 /10 (subjective, self-reported pain score)  Note: Reported level is compatible with observation.                          Effect on ADL: my daily activities is limited due to my pain Timing: Constant Modifying factors: medications, heat BP: 118/60  HR: 65  John [name] was last scheduled for an appointment on 03/27/2018 for medication management. During today's appointment we reviewed John [name] chronic pain status, as well as his outpatient medication regimen.  He admits that he was diagnosed with liver cancer.  He has a history of cirrhosis and hepatitis C.  He states that he was told that the lung cancer may have been  metastasis from the liver cancer.  He will be following up  with Dr. Zenia Resides for further treatment options surgery is pending.  He denies any concerns or changes with his current lower back pain.  The patient  reports that he does not use drugs. His body mass index is 26.85 kg/m.  Further details on both, my assessment(s), as well as the proposed treatment plan, please see below.  Controlled Substance Pharmacotherapy Assessment REMS (Risk Evaluation and Mitigation Strategy)  Analgesic:Oxycodone 2.5 one every 12 hours (5 mg/day of oxycodone) MME/day:7.5 mg/day John Fischer, RN  07/10/2018  9:03 AM  Sign at close encounter Nursing Pain Medication Assessment:  Safety precautions to be maintained throughout the outpatient stay will include: orient to surroundings, keep bed in low position, maintain call bell within reach at all times, provide assistance with transfer out of bed and ambulation.  Medication Inspection Compliance: Pill count conducted under aseptic conditions, in front of the patient. Neither the pills nor the bottle was removed from the patient's sight at any time. Once count was completed pills were immediately returned to the patient in their original bottle.  Medication: Oxycodone IR Pill/Patch Count: 16 of 30 pills remain Pill/Patch Appearance: Markings consistent with prescribed medication Bottle Appearance: Standard pharmacy container. Clearly labeled. Filled Date: 10 / 19 / 2019 Last Medication intake:  Today   Pharmacokinetics: Liberation and absorption (onset of action): WNL Distribution (time to peak effect): WNL Metabolism and excretion (duration of action): WNL         Pharmacodynamics: Desired effects: Analgesia: John [name] reports >50% benefit. Functional ability: Patient reports that medication allows him to accomplish basic ADLs Clinically meaningful improvement in function (CMIF): Sustained CMIF goals met Perceived effectiveness: Described as relatively  effective, allowing for increase in activities of daily living (ADL) Undesirable effects: Side-effects or Adverse reactions: None reported Monitoring: [place] PMP: Online review of the past 84-monthperiod conducted. Compliant with practice rules and regulations Last UDS on record: Summary  Date Value Ref Range Status  12/28/2017 FINAL  Final    Comment:    ==================================================================== TOXASSURE SELECT 13 (MW) ==================================================================== Test                             Result       Flag       Units Drug Present and Declared for Prescription Verification   Oxycodone                      405          EXPECTED   ng/mg creat   Noroxycodone                   690          EXPECTED   ng/mg creat    Sources of oxycodone include scheduled prescription medications.    Noroxycodone is an expected metabolite of oxycodone. Drug Absent but Declared for Prescription Verification   Alprazolam                     Not Detected UNEXPECTED ng/mg creat ==================================================================== Test                      Result    Flag   Units      Ref Range   Creatinine              20  mg/dL      >=20 ==================================================================== Declared Medications:  The flagging and interpretation on this report are based on the  following declared medications.  Unexpected results may arise from  inaccuracies in the declared medications.  **Note: The testing scope of this panel includes these medications:  Alprazolam  Oxycodone  **Note: The testing scope of this panel does not include following  reported medications:  Baclofen  Cetirizine  Folic acid  Gabapentin  Magnesium Oxide ==================================================================== For clinical consultation, please call [phone]. ====================================================================    UDS interpretation: Compliant          Medication Assessment Form: Reviewed. Patient indicates being compliant with therapy Treatment compliance: Compliant Risk Assessment Profile: Aberrant behavior: See prior evaluations. None observed or detected today Comorbid factors increasing risk of overdose: See prior notes. No additional risks detected today Opioid risk tool (ORT) (Total Score): 7 Personal History of Substance Abuse (SUD-Substance use disorder):  Alcohol: Positive Male or Male  Illegal Drugs: Negative  Rx Drugs: Negative  ORT Risk Level calculation: Moderate Risk Risk of substance use disorder (SUD): Low Opioid Risk Tool - 07/10/18 0912      Family History of Substance Abuse   Alcohol  Positive Male    Illegal Drugs  Negative    Rx Drugs  Negative      Personal History of Substance Abuse   Alcohol  Positive Male or Male    Illegal Drugs  Negative    Rx Drugs  Negative      Age   Age between 22-45 years   No      History of Preadolescent Sexual Abuse   History of Preadolescent Sexual Abuse  Negative or Male      Psychological Disease   Psychological Disease  Negative    Depression  Positive      Total Score   Opioid Risk Tool Scoring  7    Opioid Risk Interpretation  Moderate Risk      ORT Scoring interpretation table:  Score <3 = Low Risk for SUD  Score between 4-7 = Moderate Risk for SUD  Score >8 = High Risk for Opioid Abuse   Risk Mitigation Strategies:  Patient Counseling: Covered Patient-Prescriber Agreement (PPA): Present and active  Notification to other healthcare providers: Done  Pharmacologic Plan: No change in therapy, at this time.             Laboratory Chemistry  Inflammation Markers (CRP: Acute Phase) (ESR: Chronic Phase) Lab Results  Component Value Date   CRP 0.5 09/22/2015   ESRSEDRATE 15 09/22/2015                         Rheumatology  Markers No results found for: RF, ANA, LABURIC, URICUR, LYMEIGGIGMAB, LYMEABIGMQN, HLAB27                      Renal Function Markers Lab Results  Component Value Date   BUN 8 06/19/2018   CREATININE 0.83 06/19/2018   GFRAA >60 06/19/2018   GFRNONAA >60 06/19/2018                             Hepatic Function Markers Lab Results  Component Value Date   AST 67 (H) 06/19/2018   ALT 64 (H) 06/19/2018   ALBUMIN 4.4 06/19/2018   ALKPHOS 84 06/19/2018   HCVAB <0.1 11/06/2015  Electrolytes Lab Results  Component Value Date   NA 143 06/19/2018   K 4.3 06/19/2018   CL 103 06/19/2018   CALCIUM 9.9 06/19/2018   MG 1.9 09/22/2015                        Neuropathy Markers Lab Results  Component Value Date   WIOXBDZH29 924 08/08/2015   FOLATE 68.8 08/08/2015                        CNS Tests No results found for: COLORCSF, APPEARCSF, RBCCOUNTCSF, WBCCSF, POLYSCSF, LYMPHSCSF, EOSCSF, PROTEINCSF, GLUCCSF, JCVIRUS, CSFOLI, IGGCSF                      Bone Pathology Markers No results found for: VD25OH, VD125OH2TOT, [id number], [id number], 25OHVITD1, 25OHVITD2, 25OHVITD3, TESTOFREE, TESTOSTERONE                       Coagulation Parameters Lab Results  Component Value Date   INR 0.90 05/05/2018   LABPROT 12.1 05/05/2018   APTT 33 08/08/2015   PLT 84 (L) 06/19/2018                        Cardiovascular Markers Lab Results  Component Value Date   CKTOTAL 45 04/17/2012   CKMB 0.8 04/17/2012   TROPONINI < 0.02 04/17/2012   HGB 13.5 06/19/2018   HCT 41.2 06/19/2018                         CA Markers No results found for: CEA, CA125, LABCA2                      Note: Lab results reviewed.  Recent Diagnostic Imaging Results  NM PET Image Restag (PS) Skull Base To Thigh CLINICAL DATA:  Initial treatment strategy for liver mass.  EXAM: NUCLEAR MEDICINE PET SKULL BASE TO THIGH  TECHNIQUE: 10.8 mCi F-18 FDG was injected intravenously. Full-ring PET  imaging was performed from the skull base to thigh after the radiotracer. CT data was obtained and used for attenuation correction and anatomic localization.  Fasting blood glucose: 109 mg/dl  COMPARISON:  Hepatic MRI 04/19/2018, PET-CT 11/24/2017  FINDINGS: Mediastinal blood pool activity: SUV max 1.84  NECK: No hypermetabolic lymph nodes in the neck.  Incidental CT findings: none  CHEST: LEFT apical nodule measures 6 mm decreased from 10 mm. No residual metabolic activity.  No additional pulmonary nodularity. No hypermetabolic mediastinal lymph nodes.  Incidental CT findings: Coronary artery calcification and aortic atherosclerotic calcification.  ABDOMEN/PELVIS: Lesion in the central LEFT hepatic lobe (segment 4B) is hypermetabolic with SUV max equal 6.2. This corresponds to enhancing lesion on comparison MRI and hypodensity on comparison CT today. Lesion measures approximately 2.9 cm (image 145/3). Metabolic activity is relatively intense with SUV max equal 6.2. Activity clearly above background metabolic liver activity.  Additional hypodense lesion in the dome of the liver measuring 12 mm is not hypermetabolic and corresponds to cyst on comparison MRI.  Liver has a nodular contour consistent with cirrhosis. Gallstones noted.  No hypermetabolic lymph nodes in the abdomen pelvis. Adrenal glands are normal. No abnormal activity in the bowel. No abnormal activity in the pancreas. Normal prostate.  Incidental CT findings: Atherosclerotic calcification of the aorta.  SKELETON: No focal hypermetabolic activity to suggest skeletal metastasis.  Incidental CT findings:  none  IMPRESSION: 1. Solitary hypermetabolic lesion in the central LEFT hepatic lobe consistent with malignancy. 2. No metabolic activity remaining in LEFT apical pulmonary nodule which is reduced in size. 3. No evidence of metastatic adenopathy on skull base to thigh FDG PET scan. 4. Cirrhotic liver.   Cholelithiasis 5.  Aortic Atherosclerosis (ICD10-I70.0).  Electronically Signed   By: Suzy Bouchard M.D.   On: 06/02/2018 14:15  Complexity Note: Imaging results reviewed. Results shared with John [name], using Layman's terms.                         Meds   Current Outpatient Medications:  .  ALPRAZolam (XANAX) 0.25 MG tablet, Take 0.25 mg by mouth at bedtime as needed for anxiety., Disp: , Rfl:  .  baclofen (LIORESAL) 10 MG tablet, Take 1 tablet (10 mg total) by mouth 3 (three) times daily., Disp: 270 tablet, Rfl: 0 .  cetirizine (ZYRTEC) 10 MG tablet, Take 10 mg by mouth daily., Disp: , Rfl: 3 .  folic acid (FOLVITE) 1 MG tablet, Take 1 mg by mouth daily. , Disp: , Rfl:  .  gabapentin (NEURONTIN) 600 MG tablet, Take 1 tablet (600 mg total) by mouth 3 (three) times daily., Disp: 270 tablet, Rfl: 0 .  pravastatin (PRAVACHOL) 20 MG tablet, Take 20 mg by mouth 2 (two) times daily., Disp: , Rfl:  .  Magnesium Oxide 500 MG CAPS, Take 1 capsule (500 mg total) by mouth 2 (two) times daily at 8 am and 10 pm., Disp: 180 capsule, Rfl: 0 .  [START ON 09/18/2018] oxyCODONE (OXY IR/ROXICODONE) 5 MG immediate release tablet, Take 0.5 tablets (2.5 mg total) by mouth 2 (two) times daily., Disp: 30 tablet, Rfl: 0 .  [START ON 08/19/2018] oxyCODONE (OXY IR/ROXICODONE) 5 MG immediate release tablet, Take 0.5 tablets (2.5 mg total) by mouth 2 (two) times daily., Disp: 30 tablet, Rfl: 0 .  [START ON 07/20/2018] oxyCODONE (OXY IR/ROXICODONE) 5 MG immediate release tablet, Take 0.5 tablets (2.5 mg total) by mouth 2 (two) times daily., Disp: 30 tablet, Rfl: 0  ROS  Constitutional: Denies any fever or chills Gastrointestinal: No reported hemesis, hematochezia, vomiting, or acute GI distress Musculoskeletal: Denies any acute onset joint swelling, redness, loss of ROM, or weakness Neurological: No reported episodes of acute onset apraxia, aphasia, dysarthria, agnosia, amnesia, paralysis, loss of coordination, or  loss of consciousness  Allergies  John [name] is allergic to codeine; codeine sulfate; morphine; and duloxetine.  [place]  Drug: John [name]  reports that he does not use drugs. Alcohol:  reports that he does not drink alcohol. Tobacco:  reports that he quit smoking about 9 months ago. His smoking use included cigarettes. He started smoking about 9 months ago. He has a 17.50 pack-year smoking history. His smokeless tobacco use includes snuff. 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[place]) (06/23/2015), COPD (chronic obstructive pulmonary disease) ([place]), De Quervain's tenosynovitis, left (07/07/2017), Depression, Dystonia, Epileptic disorder ([place]) (06/23/2015), Extremity pain (05/06/2014), Febrile seizures ([place]), Foot pain (09/11/2014), Gastric ulcer (06/23/2015), Gout, H/O neoplasm (06/23/2015), Head injury (06/23/2015), Hemorrhoids, Hepatic cirrhosis ([place]) (06/23/2015), Hepatitis B, History of GI bleed, Leukopenia, Neurogenic pain (01/21/2016), Nodule of upper lobe of left lung (12/02/2017), Obstructive sleep apnea (06/23/2015), Osteoarthritis of spine with radiculopathy, lumbar region (06/23/2015), Osteoarthritis of spine with radiculopathy, lumbosacral region (06/23/2015), Peripheral neuropathy (06/23/2015), Peripheral neuropathy (Alcoholic) (43/15/4008),  Personal history of tobacco use, presenting hazards to health (01/09/2016), Pulse irregularity, Seizures ([place]), Spinal stenosis, Splenomegaly (05/30/2013), Thrombocytopenia ([place]) (30/86/5784), and Uncomplicated opioid dependence ([place]) (06/23/2015). Surgical: John [name]  has a past surgical history that includes surgery for cervical neck fracture; Esophagogastroduodenoscopy (11/2013); Tooth Extraction (Right, 09/28/2016); and Esophagogastroduodenoscopy (egd) with propofol (N/A, 06/23/2018). Family: family history includes Cancer in  his cousin and maternal aunt; Cancer (age of onset: 72) in his brother; Cancer (age of onset: 13) in his father; Heart disease in his father; Stroke in his father and mother.  Constitutional Exam  General appearance: Well nourished, well developed, and well hydrated. In no apparent acute distress Vitals:   07/10/18 0903  BP: 118/60  Pulse: 65  Temp: 97.6 F (36.4 C)  SpO2: 97%  Weight: 198 lb (89.8 kg)  Height: 6' (1.829 m)  Psych/Mental status: Alert, oriented x 3 (person, place, & time)       Eyes: PERLA Respiratory: No evidence of acute respiratory distress  Lumbar Spine Area Exam  Skin & Axial Inspection: No masses, redness, or swelling Alignment: Symmetrical Functional ROM: Unrestricted ROM       Stability: No instability detected Muscle Tone/Strength: Functionally intact. No obvious neuro-muscular anomalies detected. Sensory (Neurological): Unimpaired Palpation: No palpable anomalies        Gait & Posture Assessment  Ambulation: Unassisted Gait: Relatively normal for age and body habitus Posture: WNL   Lower Extremity Exam    Side: Right lower extremity  Side: Left lower extremity  Stability: No instability observed          Stability: No instability observed          Skin & Extremity Inspection: Skin color, temperature, and hair growth are WNL. No peripheral edema or cyanosis. No masses, redness, swelling, asymmetry, or associated skin lesions. No contractures.  Skin & Extremity Inspection: Skin color, temperature, and hair growth are WNL. No peripheral edema or cyanosis. No masses, redness, swelling, asymmetry, or associated skin lesions. No contractures.  Functional ROM: Unrestricted ROM                  Functional ROM: Unrestricted ROM                  Muscle Tone/Strength: Functionally intact. No obvious neuro-muscular anomalies detected.  Muscle Tone/Strength: Functionally intact. No obvious neuro-muscular anomalies detected.  Sensory (Neurological): Unimpaired   Sensory (Neurological): Unimpaired  Palpation: No palpable anomalies  Palpation: No palpable anomalies   Assessment  Primary Diagnosis & Pertinent Problem List: The primary encounter diagnosis was Osteoarthritis of spine with radiculopathy, lumbosacral region. Diagnoses of Lumbar spondylosis, Cervical spondylosis, Musculoskeletal pain, and Chronic pain syndrome were also pertinent to this visit.  Status Diagnosis  Controlled Controlled Controlled 1. Osteoarthritis of spine with radiculopathy, lumbosacral region   2. Lumbar spondylosis   3. Cervical spondylosis   4. Musculoskeletal pain   5. Chronic pain syndrome     Problems updated and reviewed during this visit: Problem  Cholangiocarcinoma (Hcc)   Plan of Care  Pharmacotherapy (Medications Ordered): Meds ordered this encounter  Medications  . baclofen (LIORESAL) 10 MG tablet    Sig: Take 1 tablet (10 mg total) by mouth 3 (three) times daily.    Dispense:  270 tablet    Refill:  0    Do not place this medication, or any other prescription from our practice, on "Automatic Refill". Patient may have prescription filled one day early if pharmacy is closed on scheduled refill date.  Order Specific Question:   Supervising Provider    Answer:   Milinda Pointer [phone]  . gabapentin (NEURONTIN) 600 MG tablet    Sig: Take 1 tablet (600 mg total) by mouth 3 (three) times daily.    Dispense:  270 tablet    Refill:  0    Order Specific Question:   Supervising Provider    Answer:   Milinda Pointer [phone]  . Magnesium Oxide 500 MG CAPS    Sig: Take 1 capsule (500 mg total) by mouth 2 (two) times daily at 8 am and 10 pm.    Dispense:  180 capsule    Refill:  0    Do not place this medication, or any other prescription from our practice, on "Automatic Refill". Patient may have prescription filled one day early if pharmacy is closed on scheduled refill date.    Order Specific Question:   Supervising Provider    Answer:    Milinda Pointer [phone]  . oxyCODONE (OXY IR/ROXICODONE) 5 MG immediate release tablet    Sig: Take 0.5 tablets (2.5 mg total) by mouth 2 (two) times daily.    Dispense:  30 tablet    Refill:  0    Do not place this medication, or any other prescription from our practice, on "Automatic Refill". Patient may have prescription filled one day early if pharmacy is closed on scheduled refill date.    Order Specific Question:   Supervising Provider    Answer:   Milinda Pointer [phone]  . oxyCODONE (OXY IR/ROXICODONE) 5 MG immediate release tablet    Sig: Take 0.5 tablets (2.5 mg total) by mouth 2 (two) times daily.    Dispense:  30 tablet    Refill:  0    Do not place this medication, or any other prescription from our practice, on "Automatic Refill". Patient may have prescription filled one day early if pharmacy is closed on scheduled refill date.    Order Specific Question:   Supervising Provider    Answer:   Milinda Pointer [phone]  . oxyCODONE (OXY IR/ROXICODONE) 5 MG immediate release tablet    Sig: Take 0.5 tablets (2.5 mg total) by mouth 2 (two) times daily.    Dispense:  30 tablet    Refill:  0    Do not place this medication, or any other prescription from our practice, on "Automatic Refill". Patient may have prescription filled one day early if pharmacy is closed on scheduled refill date.    Order Specific Question:   Supervising Provider    Answer:   Milinda Pointer [[id number]]   New Prescriptions   No medications on file   Medications administered today: John [name]. John [name] had no medications administered during this visit. Lab-work, procedure(s), and/or referral(s): No orders of the defined types were placed in this encounter.  Imaging and/or referral(s): None  Interventional therapies: Planned, scheduled, and/or pending: Not at this time.   Considering:  Diagnostic left L5-S1 lumbar epidural steroid injection  Diagnostic bilateral L4-5 transforaminal  epidural steroid injection  Diagnostic bilateral lumbar facet block  Diagnostic right-sided cervical epidural steroid injection    Palliative PRN treatment(s):  Not at this time.    Provider-requested follow-up: Return in about 3 months (around 10/10/2018) for MedMgmt.  Future Appointments  Date Time Provider [place]  10/09/2018  9:15 AM Vevelyn Francois, NP [facility] None   Primary Care Physician: Cletis Athens, MD Location: [facility] Note by: Vevelyn Francois NP Date: 07/10/2018; Time:  1:36 PM  Pain Score Disclaimer: We use the NRS-11 scale. This is a self-reported, subjective measurement of pain severity with only modest accuracy. It is used primarily to identify changes within a particular patient. It must be understood that outpatient pain scales are significantly less accurate that those used for research, where they can be applied under ideal controlled circumstances with minimal exposure to variables. In reality, the score is likely to be a combination of pain intensity and pain affect, where pain affect describes the degree of emotional arousal or changes in action readiness caused by the sensory experience of pain. Factors such as social and work situation, setting, emotional state, anxiety levels, expectation, and prior pain experience may influence pain perception and show large inter-individual differences that may also be affected by time variables.  Patient instructions provided during this appointment: Patient Instructions  ____________________________________________________________________________________________  Medication Rules  Applies to: All patients receiving prescriptions (written or electronic).  Pharmacy of record: Pharmacy where electronic prescriptions will be sent. If written prescriptions are taken to a different pharmacy, please inform the nursing staff. The pharmacy listed in the electronic medical record should be  the one where you would like electronic prescriptions to be sent.  Prescription refills: Only during scheduled appointments. Applies to both, written and electronic prescriptions.  NOTE: The following applies primarily to controlled substances (Opioid* Pain Medications).   Patient's responsibilities: 1. Pain Pills: Bring all pain pills to every appointment (except for procedure appointments). 2. Pill Bottles: Bring pills in original pharmacy bottle. Always bring newest bottle. Bring bottle, even if empty. 3. Medication refills: You are responsible for knowing and keeping track of what medications you need refilled. The day before your appointment, write a list of all prescriptions that need to be refilled. Bring that list to your appointment and give it to the admitting nurse. Prescriptions will be written only during appointments. If you forget a medication, it will not be "Called in", "Faxed", or "electronically sent". You will need to get another appointment to get these prescribed. 4. Prescription Accuracy: You are responsible for carefully inspecting your prescriptions before leaving our office. Have the discharge nurse carefully go over each prescription with you, before taking them home. Make sure that your name is accurately spelled, that your address is correct. Check the name and dose of your medication to make sure it is accurate. Check the number of pills, and the written instructions to make sure they are clear and accurate. Make sure that you are given enough medication to last until your next medication refill appointment. 5. Taking Medication: Take medication as prescribed. Never take more pills than instructed. Never take medication more frequently than prescribed. Taking less pills or less frequently is permitted and encouraged, when it comes to controlled substances (written prescriptions).  6. Inform other Doctors: Always inform, all of your healthcare providers, of all the  medications you take. 7. Pain Medication from other Providers: You are not allowed to accept any additional pain medication from any other Doctor or Healthcare provider. There are two exceptions to this rule. (see below) In the event that you require additional pain medication, you are responsible for notifying us, as stated below. 8. Medication Agreement: You are responsible for carefully reading and following our Medication Agreement. This must be signed before receiving any prescriptions from our practice. Safely store a copy of your signed Agreement. Violations to the Agreement will result in no further prescriptions. (Additional copies of our Medication Agreement are available upon request.) 9.  Laws, Rules, & Regulations: All patients are expected to follow all Federal and [employer], [employer], Rules, [employer]. Ignorance of the Laws does not constitute a valid excuse. The use of any illegal substances is prohibited. 10. Adopted CDC guidelines & recommendations: Target dosing levels will be at or below 60 MME/day. Use of benzodiazepines** is not recommended.  Exceptions: There are only two exceptions to the rule of not receiving pain medications from other Healthcare Providers. 1. Exception #1 (Emergencies): In the event of an emergency (i.e.: accident requiring emergency care), you are allowed to receive additional pain medication. However, you are responsible for: As soon as you are able, call our office [phone], at any time of the day or night, and leave a message stating your name, the date and nature of the emergency, and the name and dose of the medication prescribed. In the event that your call is answered by a member of our staff, make sure to document and save the date, time, and the name of the person that took your information.  2. Exception #2 (Planned Surgery): In the event that you are scheduled by another doctor or dentist to have any type of surgery or procedure, you are  allowed (for a period no longer than 30 days), to receive additional pain medication, for the acute post-op pain. However, in this case, you are responsible for picking up a copy of our "Post-op Pain Management for Surgeons" handout, and giving it to your surgeon or dentist. This document is available at our office, and does not require an appointment to obtain it. Simply go to our office during business hours (Monday-Thursday from 8:00 AM to 4:00 PM) (Friday 8:00 AM to 12:00 Noon) or if you have a scheduled appointment with [place], prior to your surgery, and ask for it by name. In addition, you will need to provide [place] with your name, name of your surgeon, type of surgery, and date of procedure or surgery.  *Opioid medications include: morphine, codeine, oxycodone, oxymorphone, hydrocodone, hydromorphone, meperidine, tramadol, tapentadol, buprenorphine, fentanyl, methadone. **Benzodiazepine medications include: diazepam (Valium), alprazolam (Xanax), clonazepam (Klonopine), lorazepam (Ativan), clorazepate (Tranxene), chlordiazepoxide (Librium), estazolam (Prosom), oxazepam (Serax), temazepam (Restoril), triazolam (Halcion) (Last updated: 11/03/2017) ____________________________________________________________________________________________     Surgical stop pain treatment form signed and given

## 2018-07-11 DIAGNOSIS — C220 Liver cell carcinoma: Secondary | ICD-10-CM

## 2018-07-12 ENCOUNTER — Telehealth: Payer: Self-pay | Admitting: Hematology

## 2018-07-12 NOTE — Telephone Encounter (Signed)
I called pt yesterday to f/u after his surgical consultation with Dr. Zenia Resides at [facility].  Surgery was not referred, he was referred to see a interventional radiologist to consider liver targeted therapy.  Patient with with the plan.  He would like to follow-up with me after his treatment at [facility], and I encouraged him to call me after his treatment. He appreciated the call.   [name]  07/12/2018

## 2018-07-20 ENCOUNTER — Encounter: Payer: Self-pay | Admitting: Hematology and Oncology

## 2018-08-06 DIAGNOSIS — C229 Malignant neoplasm of liver, not specified as primary or secondary: Secondary | ICD-10-CM

## 2018-08-06 HISTORY — DX: Malignant neoplasm of liver, not specified as primary or secondary: C22.9

## 2018-09-07 DIAGNOSIS — M4807 Spinal stenosis, lumbosacral region: Secondary | ICD-10-CM | POA: Diagnosis not present

## 2018-09-07 DIAGNOSIS — K7460 Unspecified cirrhosis of liver: Secondary | ICD-10-CM | POA: Diagnosis not present

## 2018-09-07 DIAGNOSIS — R918 Other nonspecific abnormal finding of lung field: Secondary | ICD-10-CM | POA: Diagnosis not present

## 2018-09-07 DIAGNOSIS — C3430 Malignant neoplasm of lower lobe, unspecified bronchus or lung: Secondary | ICD-10-CM | POA: Diagnosis not present

## 2018-09-13 ENCOUNTER — Other Ambulatory Visit: Payer: Self-pay | Admitting: Nurse Practitioner

## 2018-09-13 DIAGNOSIS — M7918 Myalgia, other site: Secondary | ICD-10-CM

## 2018-09-13 DIAGNOSIS — C221 Intrahepatic bile duct carcinoma: Secondary | ICD-10-CM | POA: Diagnosis not present

## 2018-09-29 ENCOUNTER — Telehealth: Payer: Self-pay | Admitting: *Deleted

## 2018-09-29 NOTE — Telephone Encounter (Signed)
I have attempted several times to complete the PA for Oxycodone, without success. I called the patient to verify the insurance information, and called the insurance [facility]), and they patient cannot be located.

## 2018-10-02 ENCOUNTER — Telehealth: Payer: Self-pay | Admitting: *Deleted

## 2018-10-02 ENCOUNTER — Telehealth: Payer: Self-pay

## 2018-10-02 NOTE — Telephone Encounter (Signed)
Patient notified that Oxycodone has been approved through 09-06-19.

## 2018-10-02 NOTE — Telephone Encounter (Signed)
Last week you were working on getting prior authorization for his meds. He spoke to [employer] and they said they want [place] to send in a prescription to [facility] in [place] for a one week supply. Then after that you need to submit another script for a month supply. It is so they can get it into their system. He switched insurance companies and they need [place] to do this the first time.

## 2018-10-09 ENCOUNTER — Ambulatory Visit: Payer: Medicare Other | Attending: Nurse Practitioner | Admitting: Nurse Practitioner

## 2018-10-09 ENCOUNTER — Encounter: Payer: Self-pay | Admitting: Nurse Practitioner

## 2018-10-09 ENCOUNTER — Other Ambulatory Visit: Payer: Self-pay

## 2018-10-09 VITALS — BP 113/56 | HR 88 | Temp 97.8°F | Ht 72.0 in | Wt 198.0 lb

## 2018-10-09 DIAGNOSIS — M7918 Myalgia, other site: Secondary | ICD-10-CM | POA: Diagnosis not present

## 2018-10-09 DIAGNOSIS — M4727 Other spondylosis with radiculopathy, lumbosacral region: Secondary | ICD-10-CM

## 2018-10-09 DIAGNOSIS — G894 Chronic pain syndrome: Secondary | ICD-10-CM | POA: Diagnosis not present

## 2018-10-09 DIAGNOSIS — M79605 Pain in left leg: Secondary | ICD-10-CM | POA: Diagnosis not present

## 2018-10-09 DIAGNOSIS — M79604 Pain in right leg: Secondary | ICD-10-CM

## 2018-10-09 DIAGNOSIS — M47816 Spondylosis without myelopathy or radiculopathy, lumbar region: Secondary | ICD-10-CM | POA: Diagnosis not present

## 2018-10-09 DIAGNOSIS — Z79891 Long term (current) use of opiate analgesic: Secondary | ICD-10-CM

## 2018-10-09 DIAGNOSIS — G8929 Other chronic pain: Secondary | ICD-10-CM

## 2018-10-09 MED ORDER — GABAPENTIN 600 MG PO TABS: 600.0000 mg | ORAL_TABLET | Freq: Three times a day (TID) | ORAL | 0 refills | Status: DC

## 2018-10-09 MED ORDER — OXYCODONE HCL 5 MG PO TABS: 2.5000 mg | ORAL_TABLET | Freq: Two times a day (BID) | ORAL | 0 refills | Status: DC

## 2018-10-09 MED ORDER — OMEPRAZOLE 20 MG PO CPDR: 20.0000 mg | DELAYED_RELEASE_CAPSULE | Freq: Every day | ORAL | 0 refills | Status: DC

## 2018-10-09 MED ORDER — BISACODYL 5 MG PO TBEC: 5.0000 mg | DELAYED_RELEASE_TABLET | Freq: Every day | ORAL | 0 refills | Status: DC | PRN

## 2018-10-09 MED ORDER — BACLOFEN 10 MG PO TABS: 10.0000 mg | ORAL_TABLET | Freq: Three times a day (TID) | ORAL | 0 refills | Status: DC

## 2018-10-09 NOTE — Progress Notes (Signed)
Nursing Pain Medication Assessment:  Safety precautions to be maintained throughout the outpatient stay will include: orient to surroundings, keep bed in low position, maintain call bell within reach at all times, provide assistance with transfer out of bed and ambulation.  Medication Inspection Compliance: Pill count conducted under aseptic conditions, in front of the patient. Neither the pills nor the bottle was removed from the patient's sight at any time. Once count was completed pills were immediately returned to the patient in their original bottle.  Medication: Oxycodone IR Pill/Patch Count: 23 of 30 pills remain Pill/Patch Appearance: Markings consistent with prescribed medication Bottle Appearance: Standard pharmacy container. Clearly labeled. Filled Date: 1 / 15 / 2020 Last Medication intake:  Today

## 2018-10-09 NOTE — Progress Notes (Signed)
Patient's Name: John [name]  MRN: [id number]  Referring Provider: Cletis Athens, MD  DOB: 06-24-1956  PCP: Cletis Athens, MD  DOS: 10/09/2018  Note by: Vevelyn Francois NP  Service setting: Ambulatory outpatient  Specialty: Interventional Pain Management  Location: [facility] (AMB) Pain Management Facility    Patient type: Established    Primary Reason(s) for Visit: Encounter for prescription drug management. (Level of risk: moderate)  CC: Back Pain  HPI  Mr. Stauffer is a 63 y.o. year old, male patient, who comes today for a medication management evaluation. He has Encounter for therapeutic drug level monitoring; Long term current use of opiate analgesic; Opiate use (7.5 MME/Day); Lumbar spondylosis; Chronic low back pain (Location of Primary Source of Pain) (Bilateral) (L>R); IVP/radiological dye Allergy; Thrombocytopenia ([place]); History of alcoholism; Carpal tunnel syndrome; Depression; Difficulty in walking; Dystonia; Gastric ulcer; HCV (hepatitis C virus); H/O neoplasm; BP (high blood pressure); Decreased leukocytes; Hepatic cirrhosis ([place]); Loss of feeling or sensation; Absence of sensation; Current tobacco use; Has a tremor; Osteoarthritis of spine with radiculopathy, lumbosacral region; Peripheral neuropathy (Alcoholic); Chronic neck pain (Location of Tertiary source of pain) (Bilateral) (R>L); Cervical spondylosis; Obstructive sleep apnea; History of panic attacks; GERD (gastroesophageal reflux disease); Chronic constipation; 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[place]); Leukopenia; Lumbar facet syndrome (Location of Primary Source of Pain) (Bilateral) (L>R); Chronic lumbar radicular pain (Right S1 and Left L5) (Location of Secondary source of pain) (Bilateral) (L>R); Cervical radicular pain (Location of Tertiary source of pain) (Bilateral) (R>L); Chronic obstructive pulmonary disease (COPD) ([place]); Head injury; Epileptic disorder ([place]); Long term prescription opiate use; Encounter for chronic pain management;  Radicular pain of shoulder (Bilateral) (R>L); Opioid-induced constipation (OIC); Personal history of tobacco use, presenting hazards to health; Neurogenic pain; Musculoskeletal pain; Chronic lower extremity pain (Location of Secondary source of pain) (Bilateral) (L>R); Chronic upper extremity pain (Location of Tertiary source of pain) (Bilateral) (R>L); Abnormal MRI, lumbar spine (2015); Lumbar central spinal stenosis (Severe at L4-5; L5-S1); Lumbar lateral recess stenosis (Bilateral L>R at L4-5; Left sided at L5-S1); Lumbar foraminal stenosis (Left L5-S1); Lumbar facet hypertrophy (L4-5 & L5-S1 L>R); Abnormal MRI, cervical spine (2014); Cervical foraminal stenosis (Right sided C3-4 & C4-5) (Left-sided C5-6 and C7-T1) (Bilateral C6-7); Splenomegaly; Chronic pain syndrome; Chronic thumb pain (Left); Bilateral carpal tunnel syndrome; Foot pain, right; History of brain tumor; De Quervain's tenosynovitis, left; Nodule of upper lobe of left lung; Goals of care, counseling/discussion; Back pain; Liver mass, right lobe; Cholangiocarcinoma ([place]); and Hepatocellular carcinoma ([place]) on their problem list. His primarily concern today is the Back Pain  Pain Assessment: Location: Lower Back Radiating: pain radiaties down both leg , more at right Onset: More than a month ago Duration: Chronic pain Quality: Aching Severity: 5 /10 (subjective, self-reported pain score)  Note: Reported level is compatible with observation.                          Effect on ADL: limits my daily activities Timing: Constant Modifying factors: medications and rest BP: (!) 113/56  HR: 88  Mr. Brobeck was last scheduled for an appointment on 09/13/2018 for medication management. During today's appointment we reviewed Mr. Kirker chronic pain status, as well as his outpatient medication regimen.  He admits that he did have seed implants for his liver cancer at [facility].  He has had increased pain on the right side since  the surgery.  He admits that the pain goes around to the back on  the right.  The patient  reports no history of drug use. His body mass index is 26.85 kg/m.  Further details on both, my assessment(s), as well as the proposed treatment plan, please see below.  Controlled Substance Pharmacotherapy Assessment REMS (Risk Evaluation and Mitigation Strategy)  Analgesic:Oxycodone 2.5 one every 12 hours (5 mg/day of oxycodone) MME/day:7.5 mg/day [name], RN  10/09/2018  9:16 AM  Sign when Signing Visit Nursing Pain Medication Assessment:  Safety precautions to be maintained throughout the outpatient stay will include: orient to surroundings, keep bed in low position, maintain call bell within reach at all times, provide assistance with transfer out of bed and ambulation.  Medication Inspection Compliance: Pill count conducted under aseptic conditions, in front of the patient. Neither the pills nor the bottle was removed from the patient's sight at any time. Once count was completed pills were immediately returned to the patient in their original bottle.  Medication: Oxycodone IR Pill/Patch Count: 23 of 30 pills remain Pill/Patch Appearance: Markings consistent with prescribed medication Bottle Appearance: Standard pharmacy container. Clearly labeled. Filled Date: 1 / 9 / 2020 Last Medication intake:  Today   Pharmacokinetics: Liberation and absorption (onset of action): WNL Distribution (time to peak effect): WNL Metabolism and excretion (duration of action): WNL         Pharmacodynamics: Desired effects: Analgesia: Mr. Kessinger reports >50% benefit. Functional ability: Patient reports that medication allows him to accomplish basic ADLs Clinically meaningful improvement in function (CMIF): Sustained CMIF goals met Perceived effectiveness: Described as relatively effective, allowing for increase in activities of daily living (ADL) Undesirable effects: Side-effects or Adverse  reactions: Constipation  Uses Bisacophyl  Monitoring: [place] PMP: Online review of the past 49-monthperiod conducted. Compliant with practice rules and regulations Last UDS on record: Summary  Date Value Ref Range Status  12/28/2017 FINAL  Final    Comment:    ==================================================================== TOXASSURE SELECT 13 (MW) ==================================================================== Test                             Result       Flag       Units Drug Present and Declared for Prescription Verification   Oxycodone                      405          EXPECTED   ng/mg creat   Noroxycodone                   690          EXPECTED   ng/mg creat    Sources of oxycodone include scheduled prescription medications.    Noroxycodone is an expected metabolite of oxycodone. Drug Absent but Declared for Prescription Verification   Alprazolam                     Not Detected UNEXPECTED ng/mg creat ==================================================================== Test                      Result    Flag   Units      Ref Range   Creatinine              20               mg/dL      >=20 ==================================================================== Declared Medications:  The flagging and interpretation on this report  are based on the  following declared medications.  Unexpected results may arise from  inaccuracies in the declared medications.  **Note: The testing scope of this panel includes these medications:  Alprazolam  Oxycodone  **Note: The testing scope of this panel does not include following  reported medications:  Baclofen  Cetirizine  Folic acid  Gabapentin  Magnesium Oxide ==================================================================== For clinical consultation, please call [phone]. ====================================================================    UDS interpretation: Compliant          Medication Assessment Form: Reviewed.  Patient indicates being compliant with therapy Treatment compliance: Compliant Risk Assessment Profile: Aberrant behavior: See prior evaluations. None observed or detected today Comorbid factors increasing risk of overdose: See prior notes. No additional risks detected today Opioid risk tool (ORT) (Total Score): 7 Personal History of Substance Abuse (SUD-Substance use disorder):  Alcohol: Positive Male or Male  Illegal Drugs: Negative  Rx Drugs: Negative  ORT Risk Level calculation: Moderate Risk Risk of substance use disorder (SUD): Moderate Opioid Risk Tool - 10/09/18 0929      Family History of Substance Abuse   Alcohol  Positive Male    Illegal Drugs  Negative    Rx Drugs  Negative      Personal History of Substance Abuse   Alcohol  Positive Male or Male    Illegal Drugs  Negative    Rx Drugs  Negative      Age   Age between 36-45 years   No      History of Preadolescent Sexual Abuse   History of Preadolescent Sexual Abuse  Negative or Male      Psychological Disease   Psychological Disease  Negative    Depression  Positive      Total Score   Opioid Risk Tool Scoring  7    Opioid Risk Interpretation  Moderate Risk      ORT Scoring interpretation table:  Score <3 = Low Risk for SUD  Score between 4-7 = Moderate Risk for SUD  Score >8 = High Risk for Opioid Abuse   Risk Mitigation Strategies:  Patient Counseling: Covered Patient-Prescriber Agreement (PPA): Present and active  Notification to other healthcare providers: Done  Pharmacologic Plan: No change in therapy, at this time.             Laboratory Chemistry  Inflammation Markers (CRP: Acute Phase) (ESR: Chronic Phase) Lab Results  Component Value Date   CRP 0.5 09/22/2015   ESRSEDRATE 15 09/22/2015                         Rheumatology Markers No results found for: RF, ANA, LABURIC, URICUR, LYMEIGGIGMAB, LYMEABIGMQN, HLAB27                      Renal Function Markers Lab Results  Component  Value Date   BUN 8 06/19/2018   CREATININE 0.83 06/19/2018   GFRAA >60 06/19/2018   GFRNONAA >60 06/19/2018                             Hepatic Function Markers Lab Results  Component Value Date   AST 67 (H) 06/19/2018   ALT 64 (H) 06/19/2018   ALBUMIN 4.4 06/19/2018   ALKPHOS 84 06/19/2018   HCVAB <0.1 11/06/2015                        Electrolytes Lab  Results  Component Value Date   NA 143 06/19/2018   K 4.3 06/19/2018   CL 103 06/19/2018   CALCIUM 9.9 06/19/2018   MG 1.9 09/22/2015                        Neuropathy Markers Lab Results  Component Value Date   FIEPPIRJ18 841 08/08/2015   FOLATE 68.8 08/08/2015                        CNS Tests No results found for: COLORCSF, APPEARCSF, RBCCOUNTCSF, WBCCSF, POLYSCSF, LYMPHSCSF, EOSCSF, PROTEINCSF, GLUCCSF, JCVIRUS, CSFOLI, IGGCSF                      Bone Pathology Markers No results found for: VD25OH, VD125OH2TOT, [id number], [id number], 25OHVITD1, 25OHVITD2, 25OHVITD3, TESTOFREE, TESTOSTERONE                       Coagulation Parameters Lab Results  Component Value Date   INR 0.90 05/05/2018   LABPROT 12.1 05/05/2018   APTT 33 08/08/2015   PLT 84 (L) 06/19/2018                        Cardiovascular Markers Lab Results  Component Value Date   CKTOTAL 45 04/17/2012   CKMB 0.8 04/17/2012   TROPONINI < 0.02 04/17/2012   HGB 13.5 06/19/2018   HCT 41.2 06/19/2018                         CA Markers No results found for: CEA, CA125, LABCA2                      Endocrine Markers No results found for: TSH, FREET4                      Note: Lab results reviewed.  Recent Diagnostic Imaging Results  NM PET Image Restag (PS) Skull Base To Thigh CLINICAL DATA:  Initial treatment strategy for liver mass.  EXAM: NUCLEAR MEDICINE PET SKULL BASE TO THIGH  TECHNIQUE: 10.8 mCi F-18 FDG was injected intravenously. Full-ring PET imaging was performed from the skull base to thigh after the radiotracer. CT data was  obtained and used for attenuation correction and anatomic localization.  Fasting blood glucose: 109 mg/dl  COMPARISON:  Hepatic MRI 04/19/2018, PET-CT 11/24/2017  FINDINGS: Mediastinal blood pool activity: SUV max 1.84  NECK: No hypermetabolic lymph nodes in the neck.  Incidental CT findings: none  CHEST: LEFT apical nodule measures 6 mm decreased from 10 mm. No residual metabolic activity.  No additional pulmonary nodularity. No hypermetabolic mediastinal lymph nodes.  Incidental CT findings: Coronary artery calcification and aortic atherosclerotic calcification.  ABDOMEN/PELVIS: Lesion in the central LEFT hepatic lobe (segment 4B) is hypermetabolic with SUV max equal 6.2. This corresponds to enhancing lesion on comparison MRI and hypodensity on comparison CT today. Lesion measures approximately 2.9 cm (image 145/3). Metabolic activity is relatively intense with SUV max equal 6.2. Activity clearly above background metabolic liver activity.  Additional hypodense lesion in the dome of the liver measuring 12 mm is not hypermetabolic and corresponds to cyst on comparison MRI.  Liver has a nodular contour consistent with cirrhosis. Gallstones noted.  No hypermetabolic lymph nodes in the abdomen pelvis. Adrenal glands are normal. No abnormal activity in the bowel. No abnormal activity in  the pancreas. Normal prostate.  Incidental CT findings: Atherosclerotic calcification of the aorta.  SKELETON: No focal hypermetabolic activity to suggest skeletal metastasis.  Incidental CT findings: none  IMPRESSION: 1. Solitary hypermetabolic lesion in the central LEFT hepatic lobe consistent with malignancy. 2. No metabolic activity remaining in LEFT apical pulmonary nodule which is reduced in size. 3. No evidence of metastatic adenopathy on skull base to thigh FDG PET scan. 4. Cirrhotic liver.  Cholelithiasis 5.  Aortic Atherosclerosis (ICD10-I70.0).  Electronically Signed    By: Suzy Bouchard M.D.   On: 06/02/2018 14:15  Complexity Note: Imaging results reviewed. Results shared with Mr. Sanon, using Layman's terms.                         Meds   Current Outpatient Medications:  .  ALPRAZolam (XANAX) 0.25 MG tablet, Take 0.25 mg by mouth at bedtime as needed for anxiety., Disp: , Rfl:  .  cetirizine (ZYRTEC) 10 MG tablet, Take 10 mg by mouth daily., Disp: , Rfl: 3 .  folic acid (FOLVITE) 1 MG tablet, Take 1 mg by mouth daily. , Disp: , Rfl:  .  baclofen (LIORESAL) 10 MG tablet, Take 1 tablet (10 mg total) by mouth 3 (three) times daily., Disp: 270 tablet, Rfl: 0 .  bisacodyl (DULCOLAX) 5 MG EC tablet, Take 1 tablet (5 mg total) by mouth daily as needed for moderate constipation., Disp: 90 tablet, Rfl: 0 .  gabapentin (NEURONTIN) 600 MG tablet, Take 1 tablet (600 mg total) by mouth 3 (three) times daily., Disp: 270 tablet, Rfl: 0 .  Magnesium Oxide 500 MG CAPS, Take 1 capsule (500 mg total) by mouth 2 (two) times daily at 8 am and 10 pm., Disp: 180 capsule, Rfl: 0 .  omeprazole (PRILOSEC) 20 MG capsule, Take 1 capsule (20 mg total) by mouth daily., Disp: 90 capsule, Rfl: 0 .  [START ON 12/31/2018] oxyCODONE (OXY IR/ROXICODONE) 5 MG immediate release tablet, Take 0.5 tablets (2.5 mg total) by mouth 2 (two) times daily for 30 days., Disp: 30 tablet, Rfl: 0 .  [START ON 12/01/2018] oxyCODONE (OXY IR/ROXICODONE) 5 MG immediate release tablet, Take 0.5 tablets (2.5 mg total) by mouth 2 (two) times daily for 30 days., Disp: 30 tablet, Rfl: 0 .  [START ON 11/01/2018] oxyCODONE (OXY IR/ROXICODONE) 5 MG immediate release tablet, Take 0.5 tablets (2.5 mg total) by mouth 2 (two) times daily for 30 days., Disp: 30 tablet, Rfl: 0  ROS  Constitutional: Denies any fever or chills Gastrointestinal: No reported hemesis, hematochezia, vomiting, or acute GI distress Musculoskeletal: Denies any acute onset joint swelling, redness, loss of ROM, or weakness Neurological: No reported  episodes of acute onset apraxia, aphasia, dysarthria, agnosia, amnesia, paralysis, loss of coordination, or loss of consciousness  Allergies  Mr. Tejada is allergic to codeine; codeine sulfate; morphine; and duloxetine.  [place]  Drug: Mr. Blixt  reports no history of drug use. Alcohol:  reports no history of alcohol use. Tobacco:  reports that he quit smoking about a year ago. His smoking use included cigarettes. He started smoking about a year ago. He has a 17.50 pack-year smoking history. His smokeless tobacco use includes snuff. 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[place]) (06/23/2015), COPD (chronic obstructive pulmonary disease) ([place]), De Quervain's tenosynovitis, left (07/07/2017), Depression, Dystonia, Epileptic disorder ([place]) (06/23/2015), Extremity pain (05/06/2014), Febrile seizures ([place]), Foot  pain (09/11/2014), Gastric ulcer (06/23/2015), Gout, H/O neoplasm (06/23/2015), Head injury (06/23/2015), Hemorrhoids, Hepatic cirrhosis ([place]) (06/23/2015), Hepatitis B, History of GI bleed, Leukopenia, Neurogenic pain (01/21/2016), Nodule of upper lobe of left lung (12/02/2017), Obstructive sleep apnea (06/23/2015), Osteoarthritis of spine with radiculopathy, lumbar region (06/23/2015), Osteoarthritis of spine with radiculopathy, lumbosacral region (06/23/2015), Peripheral neuropathy (06/23/2015), Peripheral neuropathy (Alcoholic) (24/58/0998), Personal history of tobacco use, presenting hazards to health (01/09/2016), Pulse irregularity, Seizures ([place]), Spinal stenosis, Splenomegaly (05/30/2013), Thrombocytopenia ([place]) (33/82/5053), and Uncomplicated opioid dependence ([place]) (06/23/2015). Surgical: Mr. Mcgreal  has a past surgical history that includes surgery for cervical neck fracture; Esophagogastroduodenoscopy (11/2013); Tooth Extraction (Right, 09/28/2016); and  Esophagogastroduodenoscopy (egd) with propofol (N/A, 06/23/2018). Family: family history includes Cancer in his cousin and maternal aunt; Cancer (age of onset: 11) in his brother; Cancer (age of onset: 59) in his father; Heart disease in his father; Stroke in his father and mother.  Constitutional Exam  General appearance: Well nourished, well developed, and well hydrated. In no apparent acute distress Vitals:   10/09/18 0916  BP: (!) 113/56  Pulse: 88  Temp: 97.8 F (36.6 C)  SpO2: 97%  Weight: 198 lb (89.8 kg)  Height: 6' (1.829 m)  Psych/Mental status: Alert, oriented x 3 (person, place, & time)       Eyes: PERLA Respiratory: No evidence of acute respiratory distress  Lumbar Spine Area Exam  Skin & Axial Inspection: No masses, redness, or swelling Alignment: Symmetrical Functional ROM: Unrestricted ROM       Stability: No instability detected Muscle Tone/Strength: Functionally intact. No obvious neuro-muscular anomalies detected. Sensory (Neurological): Unimpaired Palpation: No palpable anomalies       Provocative Tests: Hyperextension/rotation test: deferred today       Lumbar quadrant test (Kemp's test): deferred today       Lateral bending test: deferred today       Patrick's Maneuver: deferred today                     Gait & Posture Assessment  Ambulation: Unassisted Gait: Relatively normal for age and body habitus Posture: WNL   Lower Extremity Exam    Side: Right lower extremity  Side: Left lower extremity  Stability: No instability observed          Stability: No instability observed          Skin & Extremity Inspection: Skin color, temperature, and hair growth are WNL. No peripheral edema or cyanosis. No masses, redness, swelling, asymmetry, or associated skin lesions. No contractures.  Skin & Extremity Inspection: Skin color, temperature, and hair growth are WNL. No peripheral edema or cyanosis. No masses, redness, swelling, asymmetry, or associated skin lesions.  No contractures.  Functional ROM: Unrestricted ROM                  Functional ROM: Unrestricted ROM                  Muscle Tone/Strength: Functionally intact. No obvious neuro-muscular anomalies detected.  Muscle Tone/Strength: Functionally intact. No obvious neuro-muscular anomalies detected.  Sensory (Neurological): Unimpaired        Sensory (Neurological): Unimpaired            Palpation: No palpable anomalies  Palpation: No palpable anomalies   Assessment  Primary Diagnosis & Pertinent Problem List: The primary encounter diagnosis was Lumbar spondylosis. Diagnoses of Osteoarthritis of spine with radiculopathy, lumbosacral region, Chronic lower extremity pain (Location of Secondary source of pain) (Bilateral) (L>R), Musculoskeletal  pain, Chronic pain syndrome, and Long term current use of opiate analgesic were also pertinent to this visit.  Status Diagnosis  Controlled Controlled Controlled 1. Lumbar spondylosis   2. Osteoarthritis of spine with radiculopathy, lumbosacral region   3. Chronic lower extremity pain (Location of Secondary source of pain) (Bilateral) (L>R)   4. Musculoskeletal pain   5. Chronic pain syndrome   6. Long term current use of opiate analgesic     Problems updated and reviewed during this visit: Problem  Hepatocellular Carcinoma (H[facility])   Plan of Care  Pharmacotherapy (Medications Ordered): Meds ordered this encounter  Medications  . baclofen (LIORESAL) 10 MG tablet    Sig: Take 1 tablet (10 mg total) by mouth 3 (three) times daily.    Dispense:  270 tablet    Refill:  0    Do not place this medication, or any other prescription from our practice, on "Automatic Refill". Patient may have prescription filled one day early if pharmacy is closed on scheduled refill date.  Marland Kitchen oxyCODONE (OXY IR/ROXICODONE) 5 MG immediate release tablet    Sig: Take 0.5 tablets (2.5 mg total) by mouth 2 (two) times daily for 30 days.    Dispense:  30 tablet    Refill:  0     Do not place this medication, or any other prescription from our practice, on "Automatic Refill". Patient may have prescription filled one day early if pharmacy is closed on scheduled refill date.  Marland Kitchen oxyCODONE (OXY IR/ROXICODONE) 5 MG immediate release tablet    Sig: Take 0.5 tablets (2.5 mg total) by mouth 2 (two) times daily for 30 days.    Dispense:  30 tablet    Refill:  0    Do not place this medication, or any other prescription from our practice, on "Automatic Refill". Patient may have prescription filled one day early if pharmacy is closed on scheduled refill date.  Marland Kitchen oxyCODONE (OXY IR/ROXICODONE) 5 MG immediate release tablet    Sig: Take 0.5 tablets (2.5 mg total) by mouth 2 (two) times daily for 30 days.    Dispense:  30 tablet    Refill:  0    Do not place this medication, or any other prescription from our practice, on "Automatic Refill". Patient may have prescription filled one day early if pharmacy is closed on scheduled refill date.  . gabapentin (NEURONTIN) 600 MG tablet    Sig: Take 1 tablet (600 mg total) by mouth 3 (three) times daily.    Dispense:  270 tablet    Refill:  0  . bisacodyl (DULCOLAX) 5 MG EC tablet    Sig: Take 1 tablet (5 mg total) by mouth daily as needed for moderate constipation.    Dispense:  90 tablet    Refill:  0    Order Specific Question:   Supervising Provider    Answer:   Milinda Pointer [phone]  . omeprazole (PRILOSEC) 20 MG capsule    Sig: Take 1 capsule (20 mg total) by mouth daily.    Dispense:  90 capsule    Refill:  0    Order Specific Question:   Supervising Provider    Answer:   Milinda Pointer [phone]   New Prescriptions   BISACODYL (DULCOLAX) 5 MG EC TABLET    Take 1 tablet (5 mg total) by mouth daily as needed for moderate constipation.   OMEPRAZOLE (PRILOSEC) 20 MG CAPSULE    Take 1 capsule (20 mg total) by mouth daily.  Medications administered today: Paschal Blanton. We[place] had no medications administered during this  visit. Lab-work, procedure(s), and/or referral(s): Orders Placed This Encounter  Procedures  . ToxASSURE Select 13 (MW), Urine   Imaging and/or referral(s): None  Interventional therapies: Planned, scheduled, and/or pending: Not at this time.   Considering:  Diagnostic left L5-S1 lumbar epidural steroid injection  Diagnostic bilateral L4-5 transforaminal epidural steroid injection  Diagnostic bilateral lumbar facet block  Diagnostic right-sided cervical epidural steroid injection    Palliative PRN treatment(s):  Not at this time.     Provider-requested follow-up: Return in about 3 months (around 01/07/2019) for MedMgmt.  Future Appointments  Date Time Provider [place]  01/04/2019  9:45 AM Vevelyn Francois, NP [facility] None   Primary Care Physician: Cletis Athens, MD [facility]: [facility] [facility] Note by: Vevelyn Francois NP Date: 10/09/2018; Time: 2:40 PM  Pain Score Disclaimer: We use the NRS-11 scale. This is a self-reported, subjective measurement of pain severity with only modest accuracy. It is used primarily to identify changes within a particular patient. It must be understood that outpatient pain scales are significantly less accurate that those used for research, where they can be applied under ideal controlled circumstances with minimal exposure to variables. In reality, the score is likely to be a combination of pain intensity and pain affect, where pain affect describes the degree of emotional arousal or changes in action readiness caused by the sensory experience of pain. Factors such as social and work situation, setting, emotional state, anxiety levels, expectation, and prior pain experience may influence pain perception and show large inter-individual differences that may also be affected by time variables.  Patient instructions provided during this appointment: Patient Instructions   ____________________________________________________________________________________________  Medication Rules  Purpose: To inform patients, and their family members, of our rules and regulations.  Applies to: All patients receiving prescriptions (written or electronic).  Pharmacy of record: Pharmacy where electronic prescriptions will be sent. If written prescriptions are taken to a different pharmacy, please inform the nursing staff. The pharmacy listed in the electronic medical record should be the one where you would like electronic prescriptions to be sent.  Electronic prescriptions: In compliance with the [place] (STOP) Act of 2017 (Session Lanny Cramp [phone]), effective September 06, 2018, all controlled substances must be electronically prescribed. Calling prescriptions to the pharmacy will cease to exist.  Prescription refills: Only during scheduled appointments. Applies to all prescriptions.  NOTE: The following applies primarily to controlled substances (Opioid* Pain Medications).   Patient's responsibilities: 1. Pain Pills: Bring all pain pills to every appointment (except for procedure appointments). 2. Pill Bottles: Bring pills in original pharmacy bottle. Always bring the newest bottle. Bring bottle, even if empty. 3. Medication refills: You are responsible for knowing and keeping track of what medications you take and those you need refilled. The day before your appointment: write a list of all prescriptions that need to be refilled. The day of the appointment: give the list to the admitting nurse. Prescriptions will be written only during appointments. If you forget a medication: it will not be "Called in", "Faxed", or "electronically sent". You will need to get another appointment to get these prescribed. No early refills. Do not call asking to have your prescription filled early. 4. Prescription Accuracy: You are responsible for  carefully inspecting your prescriptions before leaving our office. Have the discharge nurse carefully go over each prescription with you, before taking them home. Make sure that  your name is accurately spelled, that your address is correct. Check the name and dose of your medication to make sure it is accurate. Check the number of pills, and the written instructions to make sure they are clear and accurate. Make sure that you are given enough medication to last until your next medication refill appointment. 5. Taking Medication: Take medication as prescribed. When it comes to controlled substances, taking less pills or less frequently than prescribed is permitted and encouraged. Never take more pills than instructed. Never take medication more frequently than prescribed.  6. Inform other Doctors: Always inform, all of your healthcare providers, of all the medications you take. 7. Pain Medication from other Providers: You are not allowed to accept any additional pain medication from any other Doctor or Healthcare provider. There are two exceptions to this rule. (see below) In the event that you require additional pain medication, you are responsible for notifying us, as stated below. 8. Medication Agreement: You are responsible for carefully reading and following our Medication Agreement. This must be signed before receiving any prescriptions from our practice. Safely store a copy of your signed Agreement. Violations to the Agreement will result in no further prescriptions. (Additional copies of our Medication Agreement are available upon request.) 9. Laws, Rules, & Regulations: All patients are expected to follow all Federal and [employer], Rules, [employer]. Ignorance of the Laws does not constitute a valid excuse. The use of any illegal substances is prohibited. 10. Adopted CDC guidelines & recommendations: Target dosing levels will be at or below 60 MME/day. Use of benzodiazepines** is not  recommended.  Exceptions: There are only two exceptions to the rule of not receiving pain medications from other Healthcare Providers. 1. Exception #1 (Emergencies): In the event of an emergency (i.e.: accident requiring emergency care), you are allowed to receive additional pain medication. However, you are responsible for: As soon as you are able, call our office [phone], at any time of the day or night, and leave a message stating your name, the date and nature of the emergency, and the name and dose of the medication prescribed. In the event that your call is answered by a member of our staff, make sure to document and save the date, time, and the name of the person that took your information.  2. Exception #2 (Planned Surgery): In the event that you are scheduled by another doctor or dentist to have any type of surgery or procedure, you are allowed (for a period no longer than 30 days), to receive additional pain medication, for the acute post-op pain. However, in this case, you are responsible for picking up a copy of our "Post-op Pain Management for Surgeons" handout, and giving it to your surgeon or dentist. This document is available at our office, and does not require an appointment to obtain it. Simply go to our office during business hours (Monday-Thursday from 8:00 AM to 4:00 PM) (Friday 8:00 AM to 12:00 Noon) or if you have a scheduled appointment with [place], prior to your surgery, and ask for it by name. In addition, you will need to provide [place] with your name, name of your surgeon, type of surgery, and date of procedure or surgery.  *Opioid medications include: morphine, codeine, oxycodone, oxymorphone, hydrocodone, hydromorphone, meperidine, tramadol, tapentadol, buprenorphine, fentanyl, methadone. **Benzodiazepine medications include: diazepam (Valium), alprazolam (Xanax), clonazepam (Klonopine), lorazepam (Ativan), clorazepate (Tranxene), chlordiazepoxide (Librium), estazolam (Prosom),  oxazepam (Serax), temazepam (Restoril), triazolam (Halcion) (Last updated: 11/03/2017) ____________________________________________________________________________________________

## 2018-10-09 NOTE — Patient Instructions (Signed)
____________________________________________________________________________________________  Medication Rules  Purpose: To inform patients, and their family members, of our rules and regulations.  Applies to: All patients receiving prescriptions (written or electronic).  Pharmacy of record: Pharmacy where electronic prescriptions will be sent. If written prescriptions are taken to a different pharmacy, please inform the nursing staff. The pharmacy listed in the electronic medical record should be the one where you would like electronic prescriptions to be sent.  Electronic prescriptions: In compliance with the [place] [employer]reng[employer] Opioid Misuse Prevention (STOP) Act of 2017 (Session Law 2017-74/H243), effective September 06, 2018, all controlled substances must be electronically prescribed. Calling prescriptions to the pharmacy will cease to exist.  Prescription refills: Only during scheduled appointments. Applies to all prescriptions.  NOTE: The following applies primarily to controlled substances (Opioid* Pain Medications).   Patient's responsibilities: 1. Pain Pills: Bring all pain pills to every appointment (except for procedure appointments). 2. Pill Bottles: Bring pills in original pharmacy bottle. Always bring the newest bottle. Bring bottle, even if empty. 3. Medication refills: You are responsible for knowing and keeping track of what medications you take and those you need refilled. The day before your appointment: write a list of all prescriptions that need to be refilled. The day of the appointment: give the list to the admitting nurse. Prescriptions will be written only during appointments. If you forget a medication: it will not be "Called in", "Faxed", or "electronically sent". You will need to get another appointment to get these prescribed. No early refills. Do not call asking to have your prescription filled early. 4. Prescription Accuracy: You are responsible for  carefully inspecting your prescriptions before leaving our office. Have the discharge nurse carefully go over each prescription with you, before taking them home. Make sure that your name is accurately spelled, that your address is correct. Check the name and dose of your medication to make sure it is accurate. Check the number of pills, and the written instructions to make sure they are clear and accurate. Make sure that you are given enough medication to last until your next medication refill appointment. 5. Taking Medication: Take medication as prescribed. When it comes to controlled substances, taking less pills or less frequently than prescribed is permitted and encouraged. Never take more pills than instructed. Never take medication more frequently than prescribed.  6. Inform other Doctors: Always inform, all of your healthcare providers, of all the medications you take. 7. Pain Medication from other Providers: You are not allowed to accept any additional pain medication from any other Doctor or Healthcare provider. There are two exceptions to this rule. (see below) In the event that you require additional pain medication, you are responsible for notifying us, as stated below. 8. Medication Agreement: You are responsible for carefully reading and following our Medication Agreement. This must be signed before receiving any prescriptions from our practice. Safely store a copy of your signed Agreement. Violations to the Agreement will result in no further prescriptions. (Additional copies of our Medication Agreement are available upon request.) 9. Laws, Rules, & Regulations: All patients are expected to follow all Federal and State Laws, Statutes, Rules, & Regulations. Ignorance of the Laws does not constitute a valid excuse. The use of any illegal substances is prohibited. 10. Adopted CDC guidelines & recommendations: Target dosing levels will be at or below 60 MME/day. Use of benzodiazepines** is not  recommended.  Exceptions: There are only two exceptions to the rule of not receiving pain medications from other Healthcare Providers. 1.   Exception #1 (Emergencies): In the event of an emergency (i.e.: accident requiring emergency care), you are allowed to receive additional pain medication. However, you are responsible for: As soon as you are able, call our office [phone], at any time of the day or night, and leave a message stating your name, the date and nature of the emergency, and the name and dose of the medication prescribed. In the event that your call is answered by a member of our staff, make sure to document and save the date, time, and the name of the person that took your information.  2. Exception #2 (Planned Surgery): In the event that you are scheduled by another doctor or dentist to have any type of surgery or procedure, you are allowed (for a period no longer than 30 days), to receive additional pain medication, for the acute post-op pain. However, in this case, you are responsible for picking up a copy of our "Post-op Pain Management for Surgeons" handout, and giving it to your surgeon or dentist. This document is available at our office, and does not require an appointment to obtain it. Simply go to our office during business hours (Monday-Thursday from 8:00 AM to 4:00 PM) (Friday 8:00 AM to 12:00 Noon) or if you have a scheduled appointment with us, prior to your surgery, and ask for it by name. In addition, you will need to provide us with your name, name of your surgeon, type of surgery, and date of procedure or surgery.  *Opioid medications include: morphine, codeine, oxycodone, oxymorphone, hydrocodone, hydromorphone, meperidine, tramadol, tapentadol, buprenorphine, fentanyl, methadone. **Benzodiazepine medications include: diazepam (Valium), alprazolam (Xanax), clonazepam (Klonopine), lorazepam (Ativan), clorazepate (Tranxene), chlordiazepoxide (Librium), estazolam (Prosom),  oxazepam (Serax), temazepam (Restoril), triazolam (Halcion) (Last updated: 11/03/2017) ____________________________________________________________________________________________    

## 2018-10-10 ENCOUNTER — Other Ambulatory Visit: Payer: Self-pay | Admitting: Nurse Practitioner

## 2018-10-10 DIAGNOSIS — M7918 Myalgia, other site: Secondary | ICD-10-CM

## 2018-10-12 LAB — TOXASSURE SELECT 13 (MW), URINE

## 2018-10-13 ENCOUNTER — Other Ambulatory Visit: Payer: Self-pay | Admitting: Physician Assistant

## 2018-10-13 DIAGNOSIS — R1011 Right upper quadrant pain: Secondary | ICD-10-CM

## 2018-10-13 DIAGNOSIS — C221 Intrahepatic bile duct carcinoma: Secondary | ICD-10-CM

## 2018-10-18 ENCOUNTER — Ambulatory Visit
Admission: RE | Admit: 2018-10-18 | Discharge: 2018-10-18 | Disposition: A | Discharge status: Home / Self Care | Payer: Medicare Other | Source: Ambulatory Visit | Attending: Physician Assistant | Admitting: Physician Assistant

## 2018-10-18 DIAGNOSIS — C221 Intrahepatic bile duct carcinoma: Secondary | ICD-10-CM | POA: Diagnosis not present

## 2018-10-18 DIAGNOSIS — K8020 Calculus of gallbladder without cholecystitis without obstruction: Secondary | ICD-10-CM | POA: Diagnosis not present

## 2018-10-18 DIAGNOSIS — K7460 Unspecified cirrhosis of liver: Secondary | ICD-10-CM | POA: Diagnosis not present

## 2018-10-18 DIAGNOSIS — R1011 Right upper quadrant pain: Secondary | ICD-10-CM | POA: Diagnosis not present

## 2018-11-09 DIAGNOSIS — Z87891 Personal history of nicotine dependence: Secondary | ICD-10-CM | POA: Diagnosis not present

## 2018-11-09 DIAGNOSIS — K7460 Unspecified cirrhosis of liver: Secondary | ICD-10-CM | POA: Diagnosis not present

## 2018-11-09 DIAGNOSIS — D6959 Other secondary thrombocytopenia: Secondary | ICD-10-CM | POA: Diagnosis not present

## 2018-11-09 DIAGNOSIS — R162 Hepatomegaly with splenomegaly, not elsewhere classified: Secondary | ICD-10-CM | POA: Diagnosis not present

## 2018-11-09 DIAGNOSIS — C220 Liver cell carcinoma: Secondary | ICD-10-CM | POA: Diagnosis not present

## 2018-11-09 DIAGNOSIS — C221 Intrahepatic bile duct carcinoma: Secondary | ICD-10-CM | POA: Diagnosis not present

## 2018-11-16 ENCOUNTER — Telehealth: Payer: Self-pay | Admitting: Hematology

## 2018-11-16 NOTE — Telephone Encounter (Signed)
Scheduled appt per 3/12 sch message - pt aware of appt date and time

## 2018-11-18 ENCOUNTER — Other Ambulatory Visit: Payer: Self-pay | Admitting: Hematology

## 2018-11-18 DIAGNOSIS — K7460 Unspecified cirrhosis of liver: Secondary | ICD-10-CM

## 2018-11-18 DIAGNOSIS — C229 Malignant neoplasm of liver, not specified as primary or secondary: Secondary | ICD-10-CM

## 2018-11-20 NOTE — Progress Notes (Signed)
John [name]   [phone] Fax:(336) [phone]   Clinic Follow up Note   Patient Care Team: John Athens, MD as PCP - General (Internal Medicine) John Silvas, MD (Gastroenterology)  Date of Service:  11/21/2018  CHIEF COMPLAINT: F/u of adenocarcinoma in liver   SUMMARY OF ONCOLOGIC HISTORY:   Cholangiocarcinoma ([place])   05/05/2018 Initial Biopsy    DIAGNOSIS: 05/05/18 A. LIVER MASS; ULTRASOUND-GUIDED BIOPSY:  - CYTOKERATIN 7 POSITIVE ADENOCARCINOMA WITH ABUNDANT NECROSIS, SEE  COMMENT.  Diagnosis 06/28/18 Consult- Comprehensive, [phone] Liver Bx - POORLY DIFFERENTIATED CARCINOMA INVOLVING LIVER PARENCHYMA. SEE NOTE. Diagnosis Note Most of the tumor shows a abortive glad formation, consistent with an adenocarcinoma. However, in some areas, the tumor cells show increased cytoplasm with a nested architecture, giving them a hepatoid appearance. Immunohistochemical stains performed at the outside institution show that the tumor cells are positive for CK7 and negative for TTF-1, Napsin-A, CDX-2, Hepar-1 and CD34. Immunostains performed at [facility] show that glypican-3 has patchy positive staining in the hepatoid-appearing tumor cells. Arginase and AFP stains show very focal, weak staining. Immunostain of pCEA shows focal canalicular-like staining pattern in the tumor cells. The findings are consistent with a cholangiocarcinoma but the the tumor histomorphology and staining pattern are suggestive of a hepatocellular carcinoma component, raising possibility of a combined hepatocellular cholangiocarcinoma. Additional biopsies or re-evaluation on the resection specimen is suggested for confirmation.    06/02/2018 PET scan    PET 06/02/18  IMPRESSION: 1. Solitary hypermetabolic lesion in the central LEFT hepatic lobe consistent with malignancy. 2. No metabolic activity remaining in LEFT apical pulmonary nodule which is reduced in size. 3. No evidence of  metastatic adenopathy on skull base to thigh FDG PET scan. 4. Cirrhotic liver.  Cholelithiasis 5.  Aortic Atherosclerosis (ICD10-I70.0).    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 Jearld [name] at [facility]  Estimated dose to liver target: 117 Gy Estimated dose to lung: 5.56 Gy Impression: Successful radioembolization with administration of intravascular yttrium-90 Theraspheres as described above.    10/18/2018 Imaging    US Abdomen 10/18/18 IMPRESSION: 1. Cholelithiasis without evidence of acute cholecystitis. 2. 2.2 cm left hepatic lobe mass consistent with known malignancy. 3. Cirrhosis and splenomegaly.    11/09/2018 Imaging    MRI Abdomen 11/09/18 at [facility]  Impression: Marked increased size of the mass centered in segment 4A with new satellite lesions seen in segments 2 and 3, likely representing multifocal mass-forming cholangiocarcinoma. New tumor invasion seen within the right anterior portal vein and middle hepatic vein to the level of the IVC, which is a pattern more commonly seen with hepatocellular carcinoma.      CURRENT THERAPY:  PENDING Chemotherapy   INTERVAL HISTORY:  REVIS [name] is here for a follow up of liver cancer. He was last seen by me 5 months ago. He presents to the clinic today by himself. He notes his y90 procedure went well but afterward he had URQ pain that effected him movement, coughing, sneezing or contraction of abdominal muscles. This last 1 week and resolved. This pain has been recurrent. He currently has not had pain for the last month.    He notes he has been gaining weight but is not sure why. He denies any new swelling of body. He notes his appetite is adequate. He notes he is able to be active with house and yard work. He notes he lives by  himself. He notes he has 2 older brothers but one does not live near by.     John [name]:    Constitutional: Denies fevers, chills or abnormal weight loss Eyes: Denies blurriness of vision Ears, nose, mouth, throat, and face: Denies mucositis or sore throat Respiratory: Denies cough, dyspnea or wheezes Cardiovascular: Denies palpitation, chest discomfort or lower extremity swelling Gastrointestinal:  Denies nausea, heartburn or change in bowel habitat (+) right flank/URQ pain, intermittent  Skin: Denies abnormal skin rashes Lymphatics: Denies new lymphadenopathy or easy bruising Neurological:Denies numbness, tingling or new weaknesses Behavioral/Psych: Mood is stable, no new changes  All other [name] were reviewed with the patient and are negative.   MEDICAL HISTORY:  Past Medical History:  Diagnosis Date  . Abnormal MRI, lumbar spine (2015) 01/21/2016   FINDINGS:  L3-4: Tiny right foraminal and extra foraminal disc bulge adjacent to but not compressing the L3 nerve lateral to the neural foramen. L4-5: There is a small broad-based disc bulge. There is also hypertrophy of the ligamentum flavum and facet joints, left more than right creating moderately severe spinal stenosis and bilateral lateral recess stenosis, left greater than right. This appe  . Anxiety   . Back ache 05/06/2014  . Cervical pain 05/06/2014  . 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[place]) 06/23/2015  . COPD (chronic obstructive pulmonary disease) ([place])   . De Quervain's tenosynovitis, left 07/07/2017  . Depression   . Dystonia   . Epileptic disorder ([place]) 06/23/2015  . Extremity pain 05/06/2014  . Febrile seizures ([place])    child  . Foot pain 09/11/2014  . Gastric ulcer 06/23/2015  . Gout   . H/O neoplasm 06/23/2015  . Head injury 06/23/2015  . Hemorrhoids   . Hepatic cirrhosis ([place]) 06/23/2015  . Hepatitis B   . History of GI bleed   . Leukopenia   . Neurogenic pain 01/21/2016  . Nodule of upper lobe of left lung 12/02/2017  . Obstructive sleep apnea 06/23/2015  . Osteoarthritis of spine with radiculopathy,  lumbar region 06/23/2015  . Osteoarthritis of spine with radiculopathy, lumbosacral region 06/23/2015  . Peripheral neuropathy 06/23/2015  . Peripheral neuropathy ([place]) 21/30/8657  . Personal history of tobacco use, presenting hazards to health 01/09/2016  . Pulse irregularity   . Seizures ([place])   . Spinal stenosis   . Splenomegaly 05/30/2013  . Thrombocytopenia ([place]) 06/23/2015  . Uncomplicated opioid dependence ([place]) 06/23/2015    SURGICAL HISTORY: Past Surgical History:  Procedure Laterality Date  . ESOPHAGOGASTRODUODENOSCOPY  11/2013  . ESOPHAGOGASTRODUODENOSCOPY (EGD) WITH PROPOFOL N/A 06/23/2018   Procedure: ESOPHAGOGASTRODUODENOSCOPY (EGD) WITH PROPOFOL;  Surgeon: John Silvas, MD;  Location: [facility] ENDOSCOPY;  Service: Endoscopy;  Laterality: N/A;  . surgery for cervical neck fracture    . TOOTH EXTRACTION Right 09/28/2016    I have reviewed the social history and family history with the patient and they are unchanged from previous note.  ALLERGIES:  is allergic to codeine; codeine sulfate; morphine; and duloxetine.  MEDICATIONS:  Current Outpatient Medications  Medication Sig Dispense Refill  . ALPRAZolam (XANAX) 0.25 MG tablet Take 0.25 mg by mouth at bedtime as needed for anxiety.    . baclofen (LIORESAL) 10 MG tablet Take 1 tablet (10 mg total) by mouth 3 (three) times daily. 270 tablet 0  . bisacodyl (DULCOLAX) 5 MG EC tablet Take 1 tablet (5 mg total) by mouth daily as needed for moderate constipation. 90 tablet 0  . cetirizine (ZYRTEC) 10 MG tablet Take 10 mg by mouth daily.  3  . folic acid (FOLVITE) 1 MG tablet Take 1 mg by mouth daily.     Marland Kitchen gabapentin (NEURONTIN) 600 MG tablet Take 1 tablet (600 mg total) by mouth 3 (three) times daily. 270 tablet 0  . omeprazole (PRILOSEC) 20 MG capsule Take 1 capsule (20 mg total) by mouth daily. 90 capsule 0  . [START ON 12/31/2018] oxyCODONE (OXY IR/ROXICODONE) 5 MG immediate release tablet Take 0.5 tablets (2.5 mg  total) by mouth 2 (two) times daily for 30 days. 30 tablet 0  . [START ON 12/01/2018] oxyCODONE (OXY IR/ROXICODONE) 5 MG immediate release tablet Take 0.5 tablets (2.5 mg total) by mouth 2 (two) times daily for 30 days. 30 tablet 0  . oxyCODONE (OXY IR/ROXICODONE) 5 MG immediate release tablet Take 0.5 tablets (2.5 mg total) by mouth 2 (two) times daily for 30 days. 30 tablet 0  . Magnesium Oxide 500 MG CAPS Take 1 capsule (500 mg total) by mouth 2 (two) times daily at 8 am and 10 pm. 180 capsule 0   No current facility-administered medications for this visit.     PHYSICAL EXAMINATION: ECOG PERFORMANCE STATUS: 1 - Symptomatic but completely ambulatory  Vitals:   11/21/18 1511  BP: 130/77  Pulse: 70  Resp: 18  Temp: 97.8 F (36.6 C)  SpO2: 100%   Filed Weights   11/21/18 1511  Weight: 211 lb 6.4 oz (95.9 kg)    GENERAL:alert, no distress and comfortable SKIN: skin color, texture, turgor are normal, no rashes or significant lesions EYES: normal, Conjunctiva are pink and non-injected, sclera clear OROPHARYNX:no exudate, no erythema and lips, buccal mucosa, and tongue normal  NECK: supple, thyroid normal size, non-tender, without nodularity LYMPH:  no palpable lymphadenopathy in the cervical, axillary or inguinal LUNGS: clear to auscultation and percussion with normal breathing effort HEART: regular rate & rhythm and no murmurs and no lower extremity edema ABDOMEN:abdomen soft and normal bowel sounds (+) Mild URQ tenderness with very mild hepatomegaly  Musculoskeletal:no cyanosis of digits and no clubbing  NEURO: alert & oriented x 3 with fluent speech, no focal motor/sensory deficits  LABORATORY DATA:  I have reviewed the data as listed CBC Latest Ref Rng & Units 11/21/2018 06/19/2018 05/05/2018  WBC 4.0 - 10.5 K/uL 1.9(L) 2.9(L) 2.9(L)  Hemoglobin 13.0 - 17.0 g/dL 11.2(L) 13.5 13.1  Hematocrit 39.0 - 52.0 % 37.5(L) 41.2 38.6(L)  Platelets 150 - 400 K/uL 65(L) 84(L) 81(L)      CMP Latest Ref Rng & Units 11/21/2018 06/19/2018 03/02/2018  Glucose 70 - 99 mg/dL 91 81 87  BUN 8 - 23 mg/dL 8 8 11   Creatinine 0.61 - 1.24 mg/dL 0.81 0.83 0.80  Sodium 135 - 145 mmol/L 142 143 139  Potassium 3.5 - 5.1 mmol/L 4.4 4.3 4.2  Chloride 98 - 111 mmol/L 102 103 103  CO2 22 - 32 mmol/L 29 30 28   Calcium 8.9 - 10.3 mg/dL 9.0 9.9 9.3  Total Protein 6.5 - 8.1 g/dL 8.0 8.3(H) 7.7  Total Bilirubin 0.3 - 1.2 mg/dL 0.6 0.8 0.8  Alkaline Phos 38 - 126 U/L 92 84 67  AST 15 - 41 U/L 24 67(H) 23  ALT 0 - 44 U/L 16 64(H) 17   Upper Endoscopy by Dr Tiffany Kocher 06/23/18  IMPRESSION - Esophageal mucosal changes suspicious for short-segment Barrett's esophagus. - Portal hypertensive gastropathy. - Gastritis. - Normal examined duodenum. - No specimens collected.   RADIOGRAPHIC STUDIES: I have personally reviewed the radiological images as listed and agreed with  the findings in the report. No results found.   ASSESSMENT & PLAN:  John [name] is a 63 y.o. male with   1.   Poorly differentiated adenocarcinoma in liver, likely cholangiocarcinoma, unresectable -He was diagnosed in 04/2018.  His liver biopsy showed poorly differentiated adenocarcinoma, CK7 positive, other markers were negative.  Upper endoscopy was negative for malignancy.  PET scan otherwise negative for other primary tumor. -His biopsy was evaluated by our pathologist Dr. Rogue Jury here, who feels this is most consistent with cholangiocarcinoma, with possible component of hepatocellular carcinoma. -Patient did have a 1 cm hypermetabolic lung lesion in early 2019, which was treated with SBRT.  Metastatic lung cancer to liver is also a possibility, although the immunostain was not typical. -He was seen by surgeon Dr. Zenia Resides at [facility] in 07/2018 who did not recommend surgery but referred him to IR for target liver therapy. -He underwent y90 radio embolization on 08/24/18 with Dr. Jearld [name] at [facility].  -Surveillance Liver MRI from  11/08/28 at [facility] showed increase of liver mass in segment 4A representing mass-forming cholangiocarcinoma along with new tumor invasion with a pattern commonly seen with hepatocellular carcinoma. I reviewed in great detail with patient.  -To rule out distant metastasis, I will get CT Chest in the next 1-2 weeks. Per patient, it will be done at [facility].   -I will John his imaging in GI Tumor Board.  -Given his tumor is non-resectable, it's incurable at this stage.  I discussed the systemic treatment option for cholangiocarcinoma, traditionally will be chemotherapy such as cisplatin and gemcitabine. Goal of therapy is palliative to prolong his life. He will think about it.  -will request genomic testing of his tumor [facility].  If the initial biopsy tissue was not sufficient, I would recommend to repeat liver biopsy. -Labs reviewed, CBC shows WBC at 1.9, Hg at 11.2, PLT at 65K, ANC at 1.3, CMP and pt/inr WNL. Ca 19.9 is still pending.  -f/u in 2 weeks   2. Hepatitis B and liver cirrhosis -He doesn't see a hepatologist nor an infectious disease physician, he has never received hepatitis B treatment. -will repeat Hep B S antigen, S antibody, core antibody, and a viral load  -No varices on 06/2018 Endoscopy.  -Has moderate pancytopenia on lab today (11/21/18). Will monitor, especially with treatment.  -I encouraged him to return to GI Dr. Tiffany Kocher for management.   3. 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  -CBC shows PLT at 65K today (11/21/18)  4. Left apical lung cancer stage IA,s/p SBRT, COPD -PET scanon 11/24/2017 revealed a hypermetabolic 1.0 cm left apical pulmonary nodulefavoring malignancy. Assuming non-small cell lung cancer this represents T1a N0 M0 disease (stage IA). Biopsy was not obtained -Underwent SBRT 01/05/18-529/19 -will repeat CT chest   5. Social  Support  -He lives by himself.  -He has 2 brothers but 1 does not live very close.    PLAN:  -I reviewed his recent abdominal MRI from [facility] which shows disease progression -CT Chest in 1-2 weeks at [facility]  -F/u in 2 weeks  -I have called path and request FO on his initial biopsy, if not sufficient tumor, I will recommend repeating liver biopsy    No problem-specific Assessment & Plan notes found for this encounter.   Orders Placed This Encounter  Procedures  . CT Chest Wo Contrast    Standing Status:   Future  Standing Expiration Date:   11/21/2019    Order Specific Question:   Preferred imaging location?    Answer:    [facility]    Order Specific Question:   Radiology Contrast Protocol - do NOT remove file path    Answer:   [email or web address]   All questions were answered. The patient knows to call the clinic with any problems, questions or concerns. No barriers to learning was detected. I spent 30 minutes counseling the patient face to face. The total time spent in the appointment was 40 minutes and more than 50% was on counseling and John of test results     [name], MD 11/21/2018   I, Joslyn Devon, am acting as scribe for [name], MD.   I have reviewed the above documentation for accuracy and completeness, and I agree with the above.

## 2018-11-21 ENCOUNTER — Other Ambulatory Visit: Payer: Self-pay

## 2018-11-21 ENCOUNTER — Inpatient Hospital Stay: Payer: Medicare Other | Attending: Hematology | Admitting: Hematology

## 2018-11-21 ENCOUNTER — Telehealth: Payer: Self-pay | Admitting: Hematology

## 2018-11-21 ENCOUNTER — Encounter: Payer: Self-pay | Admitting: Hematology

## 2018-11-21 ENCOUNTER — Inpatient Hospital Stay: Payer: Medicare Other

## 2018-11-21 VITALS — BP 130/77 | HR 70 | Temp 97.8°F | Resp 18 | Ht 72.0 in | Wt 211.4 lb

## 2018-11-21 DIAGNOSIS — Z79899 Other long term (current) drug therapy: Secondary | ICD-10-CM | POA: Diagnosis not present

## 2018-11-21 DIAGNOSIS — D61818 Other pancytopenia: Secondary | ICD-10-CM

## 2018-11-21 DIAGNOSIS — K7460 Unspecified cirrhosis of liver: Secondary | ICD-10-CM | POA: Diagnosis not present

## 2018-11-21 DIAGNOSIS — B1910 Unspecified viral hepatitis B without hepatic coma: Secondary | ICD-10-CM | POA: Diagnosis not present

## 2018-11-21 DIAGNOSIS — C221 Intrahepatic bile duct carcinoma: Secondary | ICD-10-CM

## 2018-11-21 DIAGNOSIS — R911 Solitary pulmonary nodule: Secondary | ICD-10-CM | POA: Diagnosis not present

## 2018-11-21 DIAGNOSIS — D6959 Other secondary thrombocytopenia: Secondary | ICD-10-CM

## 2018-11-21 DIAGNOSIS — C229 Malignant neoplasm of liver, not specified as primary or secondary: Secondary | ICD-10-CM

## 2018-11-21 LAB — CBC WITH DIFFERENTIAL (CANCER CENTER ONLY)
Abs Immature Granulocytes: 0 10*3/uL (ref 0.00–0.07)
Basophils Absolute: 0 10*3/uL (ref 0.0–0.1)
Basophils Relative: 1 %
Eosinophils Absolute: 0.1 10*3/uL (ref 0.0–0.5)
Eosinophils Relative: 3 %
HEMATOCRIT: 37.5 % — AB (ref 39.0–52.0)
Hemoglobin: 11.2 g/dL — ABNORMAL LOW (ref 13.0–17.0)
Immature Granulocytes: 0 %
LYMPHS ABS: 0.4 10*3/uL — AB (ref 0.7–4.0)
Lymphocytes Relative: 19 %
MCH: 28.2 pg (ref 26.0–34.0)
MCHC: 29.9 g/dL — ABNORMAL LOW (ref 30.0–36.0)
MCV: 94.5 fL (ref 80.0–100.0)
Monocytes Absolute: 0.2 10*3/uL (ref 0.1–1.0)
Monocytes Relative: 8 %
Neutro Abs: 1.3 10*3/uL — ABNORMAL LOW (ref 1.7–7.7)
Neutrophils Relative %: 69 %
Platelet Count: 65 10*3/uL — ABNORMAL LOW (ref 150–400)
RBC: 3.97 MIL/uL — ABNORMAL LOW (ref 4.22–5.81)
RDW: 16.5 % — ABNORMAL HIGH (ref 11.5–15.5)
WBC Count: 1.9 10*3/uL — ABNORMAL LOW (ref 4.0–10.5)
nRBC: 0 % (ref 0.0–0.2)

## 2018-11-21 LAB — CMP (CANCER CENTER ONLY)
ALK PHOS: 92 U/L (ref 38–126)
ALT: 16 U/L (ref 0–44)
AST: 24 U/L (ref 15–41)
Albumin: 3.9 g/dL (ref 3.5–5.0)
Anion gap: 11 (ref 5–15)
BILIRUBIN TOTAL: 0.6 mg/dL (ref 0.3–1.2)
BUN: 8 mg/dL (ref 8–23)
CALCIUM: 9 mg/dL (ref 8.9–10.3)
CO2: 29 mmol/L (ref 22–32)
CREATININE: 0.81 mg/dL (ref 0.61–1.24)
Chloride: 102 mmol/L (ref 98–111)
GFR, Est AFR Am: >60 mL/min (ref >60)
GFR, Estimated: >60 mL/min (ref >60)
Glucose, Bld: 91 mg/dL (ref 70–99)
Potassium: 4.4 mmol/L (ref 3.5–5.1)
Sodium: 142 mmol/L (ref 135–145)
Total Protein: 8 g/dL (ref 6.5–8.1)

## 2018-11-21 LAB — PROTIME-INR
INR: 1.1 (ref 0.8–1.2)
Prothrombin Time: 13.9 seconds (ref 11.4–15.2)

## 2018-11-21 NOTE — Telephone Encounter (Signed)
Scheduled appt per 3/17 los.  Printed calendar and avs.  Will call [facility] 3/18 to schedule CT appt, they did not answer 3/17.

## 2018-11-22 LAB — CANCER ANTIGEN 19-9: CA 19-9: 21 U/mL (ref 0–35)

## 2018-11-28 ENCOUNTER — Other Ambulatory Visit: Payer: Self-pay

## 2018-11-28 ENCOUNTER — Ambulatory Visit
Admission: RE | Admit: 2018-11-28 | Discharge: 2018-11-28 | Disposition: A | Discharge status: Home / Self Care | Payer: Medicare Other | Source: Ambulatory Visit | Attending: Hematology | Admitting: Hematology

## 2018-11-28 DIAGNOSIS — C221 Intrahepatic bile duct carcinoma: Secondary | ICD-10-CM | POA: Insufficient documentation

## 2018-11-28 DIAGNOSIS — C3492 Malignant neoplasm of unspecified part of left bronchus or lung: Secondary | ICD-10-CM | POA: Diagnosis not present

## 2018-11-28 HISTORY — DX: Malignant neoplasm of liver, not specified as primary or secondary: C22.9

## 2018-11-28 HISTORY — DX: Malignant neoplasm of unspecified part of left bronchus or lung: C34.92

## 2018-12-05 NOTE — Progress Notes (Signed)
John [name]   Telephone:(336) [phone] Fax:(336) [phone]   Clinic Follow up Note   Patient Care Team: Cletis Athens, MD as PCP - General (Internal Medicine) Manya Silvas, MD (Gastroenterology)  Date of Service:  12/07/2018  CHIEF COMPLAINT: F/u of adenocarcinoma in liver  SUMMARY OF ONCOLOGIC HISTORY:   Cholangiocarcinoma ([place])   05/05/2018 Initial Biopsy    DIAGNOSIS: 05/05/18 A. LIVER MASS; ULTRASOUND-GUIDED BIOPSY:  - CYTOKERATIN 7 POSITIVE ADENOCARCINOMA WITH ABUNDANT NECROSIS, SEE  COMMENT.  Diagnosis 06/28/18 Consult- Comprehensive, [phone] Liver Bx - POORLY DIFFERENTIATED CARCINOMA INVOLVING LIVER PARENCHYMA. SEE NOTE. Diagnosis Note Most of the tumor shows a abortive glad formation, consistent with an adenocarcinoma. However, in some areas, the tumor cells show increased cytoplasm with a nested architecture, giving them a hepatoid appearance. Immunohistochemical stains performed at the outside institution show that the tumor cells are positive for CK7 and negative for TTF-1, Napsin-A, CDX-2, Hepar-1 and CD34. Immunostains performed at [facility] show that glypican-3 has patchy positive staining in the hepatoid-appearing tumor cells. Arginase and AFP stains show very focal, weak staining. Immunostain of pCEA shows focal canalicular-like staining pattern in the tumor cells. The findings are consistent with a cholangiocarcinoma but the the tumor histomorphology and staining pattern are suggestive of a hepatocellular carcinoma component, raising possibility of a combined hepatocellular cholangiocarcinoma. Additional biopsies or re-evaluation on the resection specimen is suggested for confirmation.    06/02/2018 PET scan    PET 06/02/18  IMPRESSION: 1. Solitary hypermetabolic lesion in the central LEFT hepatic lobe consistent with malignancy. 2. No metabolic activity remaining in LEFT apical pulmonary nodule which is reduced in size. 3. No evidence of  metastatic adenopathy on skull base to thigh FDG PET scan. 4. Cirrhotic liver.  Cholelithiasis 5.  Aortic Atherosclerosis (ICD10-I70.0).    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 Jearld Shines at [facility]  Estimated dose to liver target: 117 Gy Estimated dose to lung: 5.56 Gy Impression: Successful radioembolization with administration of intravascular yttrium-90 Theraspheres as described above.    10/18/2018 Imaging    US Abdomen 10/18/18 IMPRESSION: 1. Cholelithiasis without evidence of acute cholecystitis. 2. 2.2 cm left hepatic lobe mass consistent with known malignancy. 3. Cirrhosis and splenomegaly.    11/09/2018 Imaging    MRI Abdomen 11/09/18 at [facility]  Impression: Marked increased size of the mass centered in segment 4A with new satellite lesions seen in segments 2 and 3, likely representing multifocal mass-forming cholangiocarcinoma. New tumor invasion seen within the right anterior portal vein and middle hepatic vein to the level of the IVC, which is a pattern more commonly seen with hepatocellular carcinoma.    11/28/2018 Imaging    CT Chest 11/28/18  IMPRESSION: 1. Further improvement in left apical nodule, now nearly resolved. Mild surrounding radiation changes. 2. No new or enlarging pulmonary nodules. 3. Treated lesion in the left hepatic lobe is not optimally visualized on this noncontrast study, although appears slightly larger. 4. Progressive adenopathy in the upper abdomen. 5. Additional incidental findings are stable, including cirrhosis, splenomegaly, aortic valvular calcifications, Aortic Atherosclerosis (ICD10-I70.0) and Emphysema (ICD10-J43.9).      CURRENT THERAPY:  PENDING Chemo Gemcitabine and Cisplatin   INTERVAL HISTORY:  John [name] is here for a follow up of liver cancer. He presents to the clinic today by himself. He notes he had abdominal pain  yesterday which has since resolved. He uses Doculax as needed for bowel  movements. BMs are regular overall. He notes his frustration with conflicting information he was receiving from other physicians. He prefers I personally review his MRI imaging or he get another MRI.  He notes one of his brothers helps him with transportation.    REVIEW OF SYSTEMS:   Constitutional: Denies fevers, chills or abnormal weight loss Eyes: Denies blurriness of vision Ears, nose, mouth, throat, and face: Denies mucositis or sore throat Respiratory: Denies cough, dyspnea or wheezes Cardiovascular: Denies palpitation, chest discomfort or lower extremity swelling Gastrointestinal:  Denies nausea, heartburn or change in bowel habits Skin: Denies abnormal skin rashes Lymphatics: Denies new lymphadenopathy or easy bruising Neurological:Denies numbness, tingling or new weaknesses Behavioral/Psych: Mood is stable, no new changes  All other systems were reviewed with the patient and are negative.  MEDICAL HISTORY:  Past Medical History:  Diagnosis Date   Abnormal MRI, lumbar spine (2015) 01/21/2016   FINDINGS:  L3-4: Tiny right foraminal and extra foraminal disc bulge adjacent to but not compressing the L3 nerve lateral to the neural foramen. L4-5: There is a small broad-based disc bulge. There is also hypertrophy of the ligamentum flavum and facet joints, left more than right creating moderately severe spinal stenosis and bilateral lateral recess stenosis, left greater than right. This appe   Anxiety    Back ache 05/06/2014   Cervical pain 05/06/2014   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[place]) 06/23/2015   COPD (chronic obstructive pulmonary disease) ([place])    De Quervain's tenosynovitis, left 07/07/2017   Depression    Dystonia    Epileptic disorder ([place]) 06/23/2015   Extremity pain 05/06/2014   Febrile seizures ([place])    child   Foot pain 09/11/2014   Gastric ulcer 06/23/2015   Gout    H/O neoplasm  06/23/2015   Head injury 06/23/2015   Hemorrhoids    Hepatic cirrhosis ([place]) 06/23/2015   Hepatitis B    History of GI bleed    Leukopenia    Liver cancer ([place]) 08/2018   Neurogenic pain 01/21/2016   Nodule of upper lobe of left lung 12/02/2017   Non-small cell cancer of left lung ([place]) 2019   Rad tx's.    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   Osteoarthritis of spine with radiculopathy, lumbosacral region 06/23/2015   Peripheral neuropathy 06/23/2015   Peripheral neuropathy ([place]) 40/98/1191   Personal history of tobacco use, presenting hazards to health 01/09/2016   Pulse irregularity    Seizures (H[facility])    Spinal stenosis    Splenomegaly 05/30/2013   Thrombocytopenia ([place]) 47/82/9562   Uncomplicated opioid dependence ([place]) 06/23/2015    SURGICAL HISTORY: Past Surgical History:  Procedure Laterality Date   ESOPHAGOGASTRODUODENOSCOPY  11/2013   ESOPHAGOGASTRODUODENOSCOPY (EGD) WITH PROPOFOL N/A 06/23/2018   Procedure: ESOPHAGOGASTRODUODENOSCOPY (EGD) WITH PROPOFOL;  Surgeon: Manya Silvas, MD;  Location: [facility] ENDOSCOPY;  Service: Endoscopy;  Laterality: N/A;   surgery for cervical neck fracture     TOOTH EXTRACTION Right 09/28/2016    I have reviewed the social history and family history with the patient and they are unchanged from previous note.  ALLERGIES:  is allergic to codeine; codeine sulfate; morphine; and duloxetine.  MEDICATIONS:  Current Outpatient Medications  Medication Sig Dispense Refill   ALPRAZolam (XANAX) 0.25 MG tablet Take 0.25 mg by mouth at bedtime as needed for anxiety.     baclofen (LIORESAL) 10 MG tablet Take 1 tablet (10 mg total) by mouth 3 (three) times daily. 270 tablet  0   bisacodyl (DULCOLAX) 5 MG EC tablet Take 1 tablet (5 mg total) by mouth daily as needed for moderate constipation. 90 tablet 0   cetirizine (ZYRTEC) 10 MG tablet Take 10 mg  by mouth daily.  3   folic acid (FOLVITE) 1 MG tablet Take 1 mg by mouth daily.      gabapentin (NEURONTIN) 600 MG tablet Take 1 tablet (600 mg total) by mouth 3 (three) times daily. 270 tablet 0   omeprazole (PRILOSEC) 20 MG capsule Take 1 capsule (20 mg total) by mouth daily. 90 capsule 0   [START ON 12/31/2018] oxyCODONE (OXY IR/ROXICODONE) 5 MG immediate release tablet Take 0.5 tablets (2.5 mg total) by mouth 2 (two) times daily for 30 days. 30 tablet 0   oxyCODONE (OXY IR/ROXICODONE) 5 MG immediate release tablet Take 0.5 tablets (2.5 mg total) by mouth 2 (two) times daily for 30 days. 30 tablet 0   Magnesium Oxide 500 MG CAPS Take 1 capsule (500 mg total) by mouth 2 (two) times daily at 8 am and 10 pm. 180 capsule 0   oxyCODONE (OXY IR/ROXICODONE) 5 MG immediate release tablet Take 0.5 tablets (2.5 mg total) by mouth 2 (two) times daily for 30 days. 30 tablet 0   No current facility-administered medications for this visit.     PHYSICAL EXAMINATION: ECOG PERFORMANCE STATUS: 1 - Symptomatic but completely ambulatory  Vitals:   12/07/18 1036  BP: (!) 145/85  Pulse: 85  Resp: 18  Temp: 97.7 F (36.5 C)  SpO2: 100%   Filed Weights   12/07/18 1036  Weight: 209 lb 14.4 oz (95.2 kg)    GENERAL:alert, no distress and comfortable SKIN: skin color, texture, turgor are normal, no rashes or significant lesions EYES: normal, Conjunctiva are pink and non-injected, sclera clear OROPHARYNX:no exudate, no erythema and lips, buccal mucosa, and tongue normal  NECK: supple, thyroid normal size, non-tender, without nodularity LYMPH:  no palpable lymphadenopathy in the cervical, axillary or inguinal LUNGS: clear to auscultation and percussion with normal breathing effort HEART: regular rate & rhythm and no murmurs and no lower extremity edema ABDOMEN:abdomen soft, non-tender and normal bowel sounds Musculoskeletal:no cyanosis of digits and no clubbing  NEURO: alert & oriented x 3 with  fluent speech, no focal motor/sensory deficits  LABORATORY DATA:  I have reviewed the data as listed CBC Latest Ref Rng & Units 11/21/2018 06/19/2018 05/05/2018  WBC 4.0 - 10.5 K/uL 1.9(L) 2.9(L) 2.9(L)  Hemoglobin 13.0 - 17.0 g/dL 11.2(L) 13.5 13.1  Hematocrit 39.0 - 52.0 % 37.5(L) 41.2 38.6(L)  Platelets 150 - 400 K/uL 65(L) 84(L) 81(L)     CMP Latest Ref Rng & Units 11/21/2018 06/19/2018 03/02/2018  Glucose 70 - 99 mg/dL 91 81 87  BUN 8 - 23 mg/dL 8 8 11   Creatinine 0.61 - 1.24 mg/dL 0.81 0.83 0.80  Sodium 135 - 145 mmol/L 142 143 139  Potassium 3.5 - 5.1 mmol/L 4.4 4.3 4.2  Chloride 98 - 111 mmol/L 102 103 103  CO2 22 - 32 mmol/L 29 30 28   Calcium 8.9 - 10.3 mg/dL 9.0 9.9 9.3  Total Protein 6.5 - 8.1 g/dL 8.0 8.3(H) 7.7  Total Bilirubin 0.3 - 1.2 mg/dL 0.6 0.8 0.8  Alkaline Phos 38 - 126 U/L 92 84 67  AST 15 - 41 U/L 24 67(H) 23  ALT 0 - 44 U/L 16 64(H) 17      RADIOGRAPHIC STUDIES: I have personally reviewed the radiological images as listed and agreed with  the findings in the report. No results found.   ASSESSMENT & PLAN:  MARQUET FAIRCLOTH is a 63 y.o. 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 His liver biopsy showed poorly differentiated adenocarcinoma, CK7 positive, other markers were negative.  Upper endoscopy was negative for malignancy.  PET scan otherwise negative for other primary tumor. -His biopsy was evaluated by our pathologist Dr. Rogue Jury here, who feels this is most consistent with cholangiocarcinoma, with possible component of hepatocellular carcinoma. -Cancer type ID test showed 90% chance of pancreaticobiliary primary -Patient did have a 1 cm hypermetabolic lung lesion in early 2019, which was treated with SBRT. No biopsy was obtained.  Metastatic lung cancer to liver is also a possibility, although the immunostain was not typical. -He was seen by surgeon Dr. Zenia Resides  at [facility] in 07/2018 who did not recommend surgery but referred him to IR for target liver therapy. -He underwent Y90 radio embolization on 08/24/18 with Dr. Jearld Shines at [facility].  -Surveillance Liver MRI from 11/08/28 at [facility] showed increase of liver mass in segment 4A representing mass-forming cholangiocarcinoma along with new tumor invasion with a pattern commonly seen with hepatocellular carcinoma. I reviewed in great detail with patient.  -Pt is still upset becase Dr. Zenia Resides told him his cancer was stable during the office visit but his NP called him and told him that his cancer has progressed after discussion in their tumor board. I will speak to Dr. Zenia Resides  -We discussed his CT Chest from 11/28/18 shows good response to RTand no new nodules.  -His Foundation One results are still pending. If there is not enough tissue for testing he is willing to do another biopsy.  -Given his liver tumor is non-resectable, it's incurable at this stage but still treatable.If target therapy is not an option I recommend standard treatment of chemo with cisplatin and gemcitabine. Plan to continue for as long as he can tolerate or disease progresses. Goal of therapy is palliative to prolong his life. Given COVID-19 I will start him on chemo every 2 weeks and then change him to 2 weeks on/1 week off later on.   --Chemotherapy consent: Side effects including but does not limited to, fatigue, nausea, vomiting, diarrhea, hair loss, neuropathy, fluid retention, renal and kidney dysfunction, neutropenic fever, needed for blood transfusion, bleeding, were discussed with patient in great detail. He agrees to proceed. -the gaol of chemo is palliative to prolong his life and preserve his quality of life  -F/u open, will decide after FO result come back    2. Hepatitis B and liver cirrhosis -He doesn't see a hepatologist nor an infectious disease physician,he has never received hepatitis B treatment. -will repeat Hep BS antigen,  Santibody, core antibody, and a viral load  -No varices on 06/2018 Endoscopy.  -I encouraged him to return to GI Dr. Tiffany Kocher for management.   3. 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  4. Left apical lungcancer stage IA,s/p SBRT, COPD -PET scanon 11/24/2017 revealed a hypermetabolic 1.0 cm left apical pulmonary nodulefavoring malignancy. Assuming non-small cell lung cancer this represents T1a N0 M0 disease (stage IA). Biopsy was not obtained -Underwent SBRT 01/05/18-02/01/18 -CT chest from 11/28/18 shows good improvement and no new nodules.   5. Social Support  -He lives by himself.  -He has 2 brothers but 1 does not live very close.  -I discussed our resources to help with  transportation if needed.    PLAN: -I will call Dr. Zenia Resides at [facility] -I will request his MRI scan images to be sent to our radiology and reviewed  -I will call pt after his FO results returns, and will schedule his f/u and chemo after that    No problem-specific Assessment & Plan notes found for this encounter.   No orders of the defined types were placed in this encounter.  All questions were answered. The patient knows to call the clinic with any problems, questions or concerns. No barriers to learning was detected. I spent 20 minutes counseling the patient face to face. The total time spent in the appointment was 25 minutes and more than 50% was on counseling and review of test results     [name], MD 12/07/2018   I, Joslyn Devon, am acting as scribe for [name], MD.   I have reviewed the above documentation for accuracy and completeness, and I agree with the above.

## 2018-12-07 ENCOUNTER — Other Ambulatory Visit: Payer: Self-pay

## 2018-12-07 ENCOUNTER — Inpatient Hospital Stay: Payer: Medicare Other | Attending: Hematology | Admitting: Hematology

## 2018-12-07 ENCOUNTER — Ambulatory Visit
Admission: RE | Admit: 2018-12-07 | Discharge: 2018-12-07 | Disposition: A | Discharge status: Home / Self Care | Payer: Self-pay | Source: Ambulatory Visit | Attending: Hematology | Admitting: Hematology

## 2018-12-07 ENCOUNTER — Encounter: Payer: Self-pay | Admitting: Hematology

## 2018-12-07 ENCOUNTER — Telehealth: Payer: Self-pay | Admitting: Hematology

## 2018-12-07 VITALS — BP 145/85 | HR 85 | Temp 97.7°F | Resp 18 | Ht 72.0 in | Wt 209.9 lb

## 2018-12-07 DIAGNOSIS — Z79899 Other long term (current) drug therapy: Secondary | ICD-10-CM | POA: Diagnosis not present

## 2018-12-07 DIAGNOSIS — D6959 Other secondary thrombocytopenia: Secondary | ICD-10-CM | POA: Insufficient documentation

## 2018-12-07 DIAGNOSIS — B1910 Unspecified viral hepatitis B without hepatic coma: Secondary | ICD-10-CM | POA: Insufficient documentation

## 2018-12-07 DIAGNOSIS — J449 Chronic obstructive pulmonary disease, unspecified: Secondary | ICD-10-CM | POA: Diagnosis not present

## 2018-12-07 DIAGNOSIS — C221 Intrahepatic bile duct carcinoma: Secondary | ICD-10-CM | POA: Insufficient documentation

## 2018-12-07 DIAGNOSIS — K7460 Unspecified cirrhosis of liver: Secondary | ICD-10-CM | POA: Insufficient documentation

## 2018-12-07 DIAGNOSIS — C220 Liver cell carcinoma: Secondary | ICD-10-CM | POA: Diagnosis not present

## 2018-12-07 NOTE — Telephone Encounter (Signed)
No los per 4/2 °

## 2018-12-12 ENCOUNTER — Encounter: Payer: Self-pay | Admitting: Hematology

## 2018-12-20 ENCOUNTER — Other Ambulatory Visit: Payer: Self-pay | Admitting: Hematology

## 2018-12-20 ENCOUNTER — Telehealth: Payer: Self-pay | Admitting: Hematology

## 2018-12-20 DIAGNOSIS — C229 Malignant neoplasm of liver, not specified as primary or secondary: Secondary | ICD-10-CM

## 2018-12-20 NOTE — Telephone Encounter (Signed)
Per pt's request, I have requested his outside MRI scan from 11/09/2018 at [facility] to be reviewed and read by our radiologist,and I finally got the report from radiology today. It confirmed his cancer progression in liver. I called pt and discussed the result with him I also told pt that his initial biopsy tissue was insufficient for Foundation One Molecular testing. I recommend him to have a second liver biopsy for further diagnosis (cholangiocarcinoma vs HCC) and FO. He agrees. I will set up his biopsy at [facility] or [place]. We also discussed initiating chemo vs immunotherapy the week after his liver biopsy, based on the biopsy path result. He voiced good understanding and agreed with the plan.   [name]  12/20/2018

## 2018-12-25 ENCOUNTER — Other Ambulatory Visit: Payer: Self-pay | Admitting: Radiology

## 2018-12-25 ENCOUNTER — Telehealth: Payer: Self-pay | Admitting: Hematology

## 2018-12-25 NOTE — Telephone Encounter (Signed)
Scheduled appt per 4/20 sch message - unable to reach patient . Left message with appt date and time .

## 2018-12-26 ENCOUNTER — Other Ambulatory Visit: Payer: Self-pay

## 2018-12-26 ENCOUNTER — Ambulatory Visit (HOSPITAL_COMMUNITY)
Admission: RE | Admit: 2018-12-26 | Discharge: 2018-12-26 | Disposition: A | Discharge status: Home / Self Care | Payer: Medicare Other | Source: Ambulatory Visit | Attending: Hematology | Admitting: Hematology

## 2018-12-26 ENCOUNTER — Encounter (HOSPITAL_COMMUNITY): Payer: Self-pay

## 2018-12-26 DIAGNOSIS — C220 Liver cell carcinoma: Secondary | ICD-10-CM | POA: Diagnosis not present

## 2018-12-26 DIAGNOSIS — M109 Gout, unspecified: Secondary | ICD-10-CM | POA: Insufficient documentation

## 2018-12-26 DIAGNOSIS — J449 Chronic obstructive pulmonary disease, unspecified: Secondary | ICD-10-CM | POA: Diagnosis not present

## 2018-12-26 DIAGNOSIS — F329 Major depressive disorder, single episode, unspecified: Secondary | ICD-10-CM | POA: Insufficient documentation

## 2018-12-26 DIAGNOSIS — F419 Anxiety disorder, unspecified: Secondary | ICD-10-CM | POA: Diagnosis not present

## 2018-12-26 DIAGNOSIS — R160 Hepatomegaly, not elsewhere classified: Secondary | ICD-10-CM | POA: Diagnosis present

## 2018-12-26 DIAGNOSIS — Z87891 Personal history of nicotine dependence: Secondary | ICD-10-CM | POA: Diagnosis not present

## 2018-12-26 DIAGNOSIS — C229 Malignant neoplasm of liver, not specified as primary or secondary: Secondary | ICD-10-CM | POA: Diagnosis not present

## 2018-12-26 DIAGNOSIS — B1910 Unspecified viral hepatitis B without hepatic coma: Secondary | ICD-10-CM | POA: Diagnosis not present

## 2018-12-26 DIAGNOSIS — G629 Polyneuropathy, unspecified: Secondary | ICD-10-CM | POA: Insufficient documentation

## 2018-12-26 DIAGNOSIS — G4733 Obstructive sleep apnea (adult) (pediatric): Secondary | ICD-10-CM | POA: Diagnosis not present

## 2018-12-26 DIAGNOSIS — C227 Other specified carcinomas of liver: Secondary | ICD-10-CM | POA: Diagnosis not present

## 2018-12-26 DIAGNOSIS — C3492 Malignant neoplasm of unspecified part of left bronchus or lung: Secondary | ICD-10-CM | POA: Diagnosis not present

## 2018-12-26 DIAGNOSIS — K7460 Unspecified cirrhosis of liver: Secondary | ICD-10-CM | POA: Diagnosis not present

## 2018-12-26 DIAGNOSIS — K769 Liver disease, unspecified: Secondary | ICD-10-CM | POA: Diagnosis not present

## 2018-12-26 DIAGNOSIS — C221 Intrahepatic bile duct carcinoma: Secondary | ICD-10-CM | POA: Diagnosis not present

## 2018-12-26 DIAGNOSIS — K7689 Other specified diseases of liver: Secondary | ICD-10-CM | POA: Diagnosis not present

## 2018-12-26 LAB — PROTIME-INR
INR: 1.1 (ref 0.8–1.2)
Prothrombin Time: 14.5 seconds (ref 11.4–15.2)

## 2018-12-26 LAB — CBC
HCT: 38.6 % — ABNORMAL LOW (ref 39.0–52.0)
Hemoglobin: 11.5 g/dL — ABNORMAL LOW (ref 13.0–17.0)
MCH: 27.8 pg (ref 26.0–34.0)
MCHC: 29.8 g/dL — ABNORMAL LOW (ref 30.0–36.0)
MCV: 93.5 fL (ref 80.0–100.0)
Platelets: UNDETERMINED 10*3/uL (ref 150–400)
RBC: 4.13 MIL/uL — ABNORMAL LOW (ref 4.22–5.81)
RDW: 16.8 % — ABNORMAL HIGH (ref 11.5–15.5)
WBC: 2.2 10*3/uL — ABNORMAL LOW (ref 4.0–10.5)
nRBC: 0 % (ref 0.0–0.2)

## 2018-12-26 MED ORDER — GELATIN ABSORBABLE 12-7 MM EX MISC
CUTANEOUS | Status: AC
  Filled 2018-12-26: qty 1

## 2018-12-26 MED ORDER — MIDAZOLAM HCL 2 MG/2ML IJ SOLN
INTRAMUSCULAR | Status: AC | PRN
  Administered 2018-12-26: 0.5 mg via INTRAVENOUS
  Administered 2018-12-26: 1 mg via INTRAVENOUS
  Administered 2018-12-26: 0.5 mg via INTRAVENOUS

## 2018-12-26 MED ORDER — SODIUM CHLORIDE 0.9 % IV SOLN
INTRAVENOUS | Status: AC | PRN
  Administered 2018-12-26: 10 mL/h via INTRAVENOUS

## 2018-12-26 MED ORDER — LIDOCAINE HCL (PF) 1 % IJ SOLN
INTRAMUSCULAR | Status: AC
  Filled 2018-12-26: qty 30

## 2018-12-26 MED ORDER — MIDAZOLAM HCL 2 MG/2ML IJ SOLN
INTRAMUSCULAR | Status: AC
  Filled 2018-12-26: qty 2

## 2018-12-26 MED ORDER — FENTANYL CITRATE (PF) 100 MCG/2ML IJ SOLN
INTRAMUSCULAR | Status: AC | PRN
  Administered 2018-12-26 (×3): 25 ug via INTRAVENOUS

## 2018-12-26 MED ORDER — SODIUM CHLORIDE 0.9 % IV SOLN: INTRAVENOUS | Status: DC

## 2018-12-26 MED ORDER — FENTANYL CITRATE (PF) 100 MCG/2ML IJ SOLN
INTRAMUSCULAR | Status: AC
  Filled 2018-12-26: qty 2

## 2018-12-26 NOTE — Procedures (Signed)
Interventional Radiology Procedure Note  Procedure: US Guided Biopsy of Liver Mass  Complications: None  Estimated Blood Loss: < 10 mL  Findings: 10 G core biopsy of left lobe hepatic mass performed under US guidance.  Three core samples obtained and sent to Pathology.  Venetia Night. Kathlene Cote, M.D Pager:  [phone]

## 2018-12-26 NOTE — Progress Notes (Signed)
Gave d/c instructions to [employer] via telephone due to [place] restrictions.  All questions answered and Amber verbalized understanding of instructions

## 2018-12-26 NOTE — Discharge Instructions (Addendum)
Liver Biopsy, Care After °These instructions give you information on caring for yourself after your procedure. Your doctor may also give you more specific instructions. Call your doctor if you have any problems or questions after your procedure. °What can I expect after the procedure? °After the procedure, it is common to have: °· Pain and soreness where the biopsy was done. °· Bruising around the area where the biopsy was done. °· Sleepiness and be tired for a few days. °Follow these instructions at home: °Medicines °· Take over-the-counter and prescription medicines only as told by your doctor. °· If you were prescribed an antibiotic medicine, take it as told by your doctor. Do not stop taking the antibiotic even if you start to feel better. °· Do not take medicines such as aspirin and ibuprofen. These medicines can thin your blood. Do not take these medicines unless your doctor tells you to take them. °· If you are taking prescription pain medicine, take actions to prevent or treat constipation. Your doctor may recommend that you: °? Drink enough fluid to keep your pee (urine) clear or pale yellow. °? Take over-the-counter or prescription medicines. °? Eat foods that are high in fiber, such as fresh fruits and vegetables, whole grains, and beans. °? Limit foods that are high in fat and processed sugars, such as fried and sweet foods. °Caring for your cut °· Follow instructions from your doctor about how to take care of your cuts from surgery (incisions). Make sure you: °? Wash your hands with soap and water before you change your bandage (dressing). If you cannot use soap and water, use hand sanitizer. °? Change your bandage as told by your doctor. °? Leave stitches (sutures), skin glue, or skin tape (adhesive) strips in place. They may need to stay in place for 2 weeks or longer. If tape strips get loose and curl up, you may trim the loose edges. Do not remove tape strips completely unless your doctor says it is  okay. °· Check your cuts every day for signs of infection. Check for: °? Redness, swelling, or more pain. °? Fluid or blood. °? Pus or a bad smell. °? Warmth. °· Do not take baths, swim, or use a hot tub until your doctor says it is okay to do so. °Activity ° °· Rest at home for 1-2 days or as told by your doctor. °? Avoid sitting for a long time without moving. Get up to take short walks every 1-2 hours. °· Return to your normal activities as told by your doctor. Ask what activities are safe for you. °· Do not do these things in the first 24 hours: °? Drive. °? Use machinery. °? Take a bath or shower. °· Do not lift more than 10 pounds (4.5 kg) or play contact sports for the first 2 weeks. °General instructions ° °· Do not drink alcohol in the first week after the procedure. °· Have someone stay with you for at least 24 hours after the procedure. °· Get your test results. Ask your doctor or the department that is doing the test: °? When will my results be ready? °? How will I get my results? °? What are my treatment options? °? What other tests do I need? °? What are my next steps? °· Keep all follow-up visits as told by your doctor. This is important. °Contact a doctor if: °· A cut bleeds and leaves more than just a small spot of blood. °· A cut is red, puffs up (  swells), or hurts more than before. °· Fluid or something else comes from a cut. °· A cut smells bad. °· You have a fever or chills. °Get help right away if: °· You have swelling, bloating, or pain in your belly (abdomen). °· You get dizzy or faint. °· You have a rash. °· You feel sick to your stomach (nauseous) or throw up (vomit). °· You have trouble breathing, feel short of breath, or feel faint. °· Your chest hurts. °· You have problems talking or seeing. °· You have trouble with your balance or moving your arms or legs. °Summary °· After the procedure, it is common to have pain, soreness, bruising, and tiredness. °· Your doctor will tell you how to  take care of yourself at home. Change your bandage, take your medicines, and limit your activities as told by your doctor. °· Call your doctor if you have symptoms of infection. Get help right away if your belly swells, your cut bleeds a lot, or you have trouble talking or breathing. °This information is not intended to replace advice given to you by your health care provider. Make sure you discuss any questions you have with your health care provider. °Document Released: 06/01/2008 Document Revised: 09/02/2017 Document Reviewed: 09/02/2017 °Elsevier Interactive Patient Education © 2019 [employer] °Moderate Conscious Sedation, Adult, Care After °These instructions provide you with information about caring for yourself after your procedure. Your health care provider may also give you more specific instructions. Your treatment has been planned according to current medical practices, but problems sometimes occur. Call your health care provider if you have any problems or questions after your procedure. °What can I expect after the procedure? °After your procedure, it is common: °· To feel sleepy for several hours. °· To feel clumsy and have poor balance for several hours. °· To have poor judgment for several hours. °· To vomit if you eat too soon. °Follow these instructions at home: °For at least 24 hours after the procedure: ° °· Do not: °? Participate in activities where you could fall or become injured. °? Drive. °? Use heavy machinery. °? Drink alcohol. °? Take sleeping pills or medicines that cause drowsiness. °? Make important decisions or sign legal documents. °? Take care of children on your own. °· Rest. °Eating and drinking °· Follow the diet recommended by your health care provider. °· If you vomit: °? Drink water, juice, or soup when you can drink without vomiting. °? Make sure you have little or no nausea before eating solid foods. °General instructions °· Have a responsible adult stay with you until  you are awake and alert. °· Take over-the-counter and prescription medicines only as told by your health care provider. °· If you smoke, do not smoke without supervision. °· Keep all follow-up visits as told by your health care provider. This is important. °Contact a health care provider if: °· You keep feeling nauseous or you keep vomiting. °· You feel light-headed. °· You develop a rash. °· You have a fever. °Get help right away if: °· You have trouble breathing. °This information is not intended to replace advice given to you by your health care provider. Make sure you discuss any questions you have with your health care provider. °Document Released: 06/13/2013 Document Revised: 01/26/2016 Document Reviewed: 12/13/2015 °Elsevier Interactive Patient Education © 2019 [employer] Inc. ° °

## 2018-12-26 NOTE — H&P (Signed)
Chief Complaint: Patient was seen in consultation today for liver lesion/biopsy.  Referring Physician(s): Feng,Yan  Supervising Physician: Aletta Edouard  Patient Status: [facility] - Out-pt  History of Present Illness: John [name] is a 63 y.o. male with a past medical history of seizures, COPD, lung cancer, liver cancer, hepatitis B, cirrhosis, hemorrhoids, chronic alcoholic pancreatitis, thrombocytopenia, spinal stenosis, gout, OA, peripheral neuropathy, OSA, alcohol and tobacco abuse, depression, and anxiety. 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 His cancer is managed by Dr. Burr Medico. He underwent IR embolization of liver lesion 08/24/2018 by Dr. Jearld Shines at [facility]. His follow-up imaging scan revealed an increase in size of liver mass, along with new satellite lesions.  IR requested by Dr. Burr Medico for possible image-guided liver lesion biopsy. Patient awake and alert laying in bed. Complains of abdominal pain, stable at this time. Denies fever, chills, chest pain, dyspnea, or headache.   Past Medical History:  Diagnosis Date   Abnormal MRI, lumbar spine (2015) 01/21/2016   FINDINGS:  L3-4: Tiny right foraminal and extra foraminal disc bulge adjacent to but not compressing the L3 nerve lateral to the neural foramen. L4-5: There is a small broad-based disc bulge. There is also hypertrophy of the ligamentum flavum and facet joints, left more than right creating moderately severe spinal stenosis and bilateral lateral recess stenosis, left greater than right. This appe   Anxiety    Back ache 05/06/2014   Cervical pain 05/06/2014   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[place]) 06/23/2015   COPD (chronic obstructive pulmonary disease) ([place])    De Quervain's tenosynovitis, left 07/07/2017   Depression    Dystonia    Epileptic disorder ([place]) 06/23/2015   Extremity pain 05/06/2014   Febrile seizures ([place])    child   Foot pain 09/11/2014    Gastric ulcer 06/23/2015   Gout    H/O neoplasm 06/23/2015   Head injury 06/23/2015   Hemorrhoids    Hepatic cirrhosis ([place]) 06/23/2015   Hepatitis B    History of GI bleed    Leukopenia    Liver cancer ([place]) 08/2018   Neurogenic pain 01/21/2016   Nodule of upper lobe of left lung 12/02/2017   Non-small cell cancer of left lung ([place]) 2019   Rad tx's.    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   Osteoarthritis of spine with radiculopathy, lumbosacral region 06/23/2015   Peripheral neuropathy 06/23/2015   Peripheral neuropathy ([facility]) 55/73/2202   Personal history of tobacco use, presenting hazards to health 01/09/2016   Pulse irregularity    Seizures (H[facility])    Spinal stenosis    Splenomegaly 05/30/2013   Thrombocytopenia ([place]) 54/27/0623   Uncomplicated opioid dependence ([place]) 06/23/2015    Past Surgical History:  Procedure Laterality Date   ESOPHAGOGASTRODUODENOSCOPY  11/2013   ESOPHAGOGASTRODUODENOSCOPY (EGD) WITH PROPOFOL N/A 06/23/2018   Procedure: ESOPHAGOGASTRODUODENOSCOPY (EGD) WITH PROPOFOL;  Surgeon: Manya Silvas, MD;  Location: [facility] ENDOSCOPY;  Service: Endoscopy;  Laterality: N/A;   surgery for cervical neck fracture     TOOTH EXTRACTION Right 09/28/2016    Allergies: Codeine; Codeine sulfate; Morphine; and Duloxetine  Medications: Prior to Admission medications   Medication Sig Start Date End Date Taking? Authorizing Provider  ALPRAZolam Duanne Moron) 0.25 MG tablet Take 0.25 mg by mouth at bedtime as needed for anxiety or sleep.    Yes [provider]  baclofen (LIORESAL) 10 MG tablet Take 1 tablet (10 mg total) by mouth 3 (three) times  daily. Patient taking differently: Take 10 mg by mouth 2 (two) times daily.  10/09/18 01/07/19 Yes King, Diona Foley, NP  bisacodyl (DULCOLAX) 5 MG EC tablet Take 1 tablet (5 mg total) by mouth daily as needed for moderate  constipation. 10/09/18 01/07/19 Yes King, Diona Foley, NP  cetirizine (ZYRTEC) 10 MG tablet Take 10 mg by mouth daily. 11/09/17  Yes [provider]  folic acid (FOLVITE) 1 MG tablet Take 1 mg by mouth daily.    Yes [provider]  gabapentin (NEURONTIN) 600 MG tablet Take 1 tablet (600 mg total) by mouth 3 (three) times daily. 10/09/18 01/07/19 Yes Vevelyn Francois, NP  omeprazole (PRILOSEC) 20 MG capsule Take 1 capsule (20 mg total) by mouth daily. 10/09/18 01/07/19 Yes King, Diona Foley, NP  oxyCODONE (OXY IR/ROXICODONE) 5 MG immediate release tablet Take 0.5 tablets (2.5 mg total) by mouth 2 (two) times daily for 30 days. 12/31/18 01/30/19 Yes Vevelyn Francois, NP  oxyCODONE (OXY IR/ROXICODONE) 5 MG immediate release tablet Take 0.5 tablets (2.5 mg total) by mouth 2 (two) times daily for 30 days. 12/01/18 12/31/18 Yes Vevelyn Francois, NP  oxyCODONE (OXY IR/ROXICODONE) 5 MG immediate release tablet Take 0.5 tablets (2.5 mg total) by mouth 2 (two) times daily for 30 days. 11/01/18 12/01/18  Vevelyn Francois, NP     Family History  Problem Relation Age of Onset   Stroke Mother    Cancer Father 85       prostate cancer   Heart disease Father    Stroke Father    Cancer Brother 48       Lung Cancer   Cancer Maternal Aunt        breast cancer    Cancer Cousin        breast cancer    Social History   Socioeconomic History   Marital status: Single    Spouse name: Not on file   Number of children: Not on file   Years of education: Not on file   Highest education level: Not on file  Occupational History   Not on file  Social Needs   Financial resource strain: Not on file   Food insecurity:    Worry: Not on file    Inability: Not on file   Transportation needs:    Medical: Not on file    Non-medical: Not on file  Tobacco Use   Smoking status: Former Smoker    Packs/day: 0.50    Years: 35.00    Pack years: 17.50    Types: Cigarettes    Start date: 10/07/2017    Last  attempt to quit: 10/07/2017    Years since quitting: 1.2   Smokeless tobacco: Current User    Types: Snuff  Substance and Sexual Activity   Alcohol use: No    Alcohol/week: 0.0 standard drinks    Comment: Pt used to drink alcohol for 30 years, states that he has been alcohol free since July 2013...   Drug use: No   Sexual activity: Not on file  Lifestyle   Physical activity:    Days per week: Not on file    Minutes per session: Not on file   Stress: Not on file  Relationships   Social connections:    Talks on phone: Not on file    Gets together: Not on file    Attends religious service: Not on file    Active member of club or organization: Not on file  Attends meetings of clubs or organizations: Not on file    Relationship status: Not on file  Other Topics Concern   Not on file  Social History Narrative   Not on file     Review of Systems: A 12 point ROS discussed and pertinent positives are indicated in the HPI above.  All other systems are negative.  Review of Systems  Constitutional: Negative for chills and fever.  Respiratory: Negative for shortness of breath and wheezing.   Cardiovascular: Negative for chest pain and palpitations.  Gastrointestinal: Positive for abdominal pain.  Neurological: Negative for headaches.  Psychiatric/Behavioral: Negative for behavioral problems and confusion.    Vital Signs: BP (!) 148/70    Pulse 89    Temp 97.6 F (36.4 C) (Oral)    Resp 18    Ht 6' (1.829 m)    Wt 205 lb (93 kg)    SpO2 99%    BMI 27.80 kg/m   Physical Exam Vitals signs and nursing note reviewed.  Constitutional:      General: He is not in acute distress.    Appearance: Normal appearance.  Cardiovascular:     Rate and Rhythm: Normal rate and regular rhythm.     Heart sounds: Normal heart sounds. No murmur.  Pulmonary:     Effort: Pulmonary effort is normal. No respiratory distress.     Breath sounds: Normal breath sounds. No wheezing.  Skin:     General: Skin is warm and dry.  Neurological:     Mental Status: He is alert and oriented to person, place, and time.  Psychiatric:        Mood and Affect: Mood normal.        Behavior: Behavior normal.        Thought Content: Thought content normal.        Judgment: Judgment normal.      MD Evaluation Airway: WNL Heart: WNL Abdomen: WNL Chest/ Lungs: WNL ASA  Classification: 3 Mallampati/Airway Score: Two   Imaging: Ct Chest Wo Contrast  Result Date: 11/28/2018 CLINICAL DATA:  History of left lung cancer with SBRT in 2019. Hepatic adenocarcinoma (likely cholangiocarcinoma) on liver biopsy 05/05/2018. Patient underwent Yttrium-90 radioembolization 08/24/2018. EXAM: CT CHEST WITHOUT CONTRAST TECHNIQUE: Multidetector CT imaging of the chest was performed following the standard protocol without IV contrast. COMPARISON:  Chest CT 11/01/2017. PET-CT 11/24/2017 and 06/02/2018. Abdominal MRI 04/19/2018. FINDINGS: Cardiovascular: Atherosclerosis of the aorta, great vessels and coronary arteries. There are aortic valvular calcifications. The heart size is normal. There is no pericardial effusion. Mediastinum/Nodes: There are no enlarged mediastinal, hilar or axillary lymph nodes.Right pericardiac nodularity measuring up to 3.0 x 2.2 cm on image 116/2 is stable, not hypermetabolic on PET-CT. Mild distal esophageal wall thickening without focal abnormality. The thyroid gland and trachea appear unremarkable. Lungs/Pleura: There is no pleural effusion or pneumothorax. Mild centrilobular and paraseptal emphysema. The previously demonstrated left apical nodule has significantly decreased in size, now measuring 5 x 4 mm on image 23/3 (previously 9 mm derived mean diameter on chest CT 11/01/2017 and 6 mm on PET-CT 06/02/2018). There is mild surrounding ground-glass opacity attributed to radiation therapy. No new or enlarging pulmonary nodules. Upper abdomen: Morphologic changes of advanced hepatic cirrhosis  are again noted. There is a cystic lesion in the dome of the right lobe measuring 14 mm on image 116/2. The treated lesion in the left lobe is not well seen on this noncontrast study, although measures approximately 15 mm on image 135/2. Cholelithiasis  and splenomegaly are noted. There is increasing adenopathy in the porta hepatis with a node measuring up to 2.1 cm on image 147/2. Musculoskeletal/Chest wall: There is no chest wall mass or suspicious osseous finding. Bilateral gynecomastia. IMPRESSION: 1. Further improvement in left apical nodule, now nearly resolved. Mild surrounding radiation changes. 2. No new or enlarging pulmonary nodules. 3. Treated lesion in the left hepatic lobe is not optimally visualized on this noncontrast study, although appears slightly larger. 4. Progressive adenopathy in the upper abdomen. 5. Additional incidental findings are stable, including cirrhosis, splenomegaly, aortic valvular calcifications, Aortic Atherosclerosis (ICD10-I70.0) and Emphysema (ICD10-J43.9). Electronically Signed   By: Richardean Sale M.D.   On: 11/28/2018 14:21    Labs:  CBC: Recent Labs    05/05/18 1034 06/19/18 1548 11/21/18 1435 12/26/18 1030  WBC 2.9* 2.9* 1.9* 2.2*  HGB 13.1 13.5 11.2* 11.5*  HCT 38.6* 41.2 37.5* 38.6*  PLT 81* 84* 65* PLATELET CLUMPS NOTED ON SMEAR, UNABLE TO ESTIMATE    COAGS: Recent Labs    05/05/18 1034 11/21/18 1435 12/26/18 1030  INR 0.90 1.1 1.1    BMP: Recent Labs    03/02/18 1328 06/19/18 1548 11/21/18 1435  NA 139 143 142  K 4.2 4.3 4.4  CL 103 103 102  CO2 28 30 29   GLUCOSE 87 81 91  BUN 11 8 8   CALCIUM 9.3 9.9 9.0  CREATININE 0.80 0.83 0.81  GFRNONAA >60 >60 >60  GFRAA >60 >60 >60    LIVER FUNCTION TESTS: Recent Labs    03/02/18 1328 06/19/18 1548 11/21/18 1435  BILITOT 0.8 0.8 0.6  AST 23 67* 24  ALT 17 64* 16  ALKPHOS 67 84 92  PROT 7.7 8.3* 8.0  ALBUMIN 4.5 4.4 3.9     Assessment and Plan:  Liver lesion. Plan for  image-guided liver lesion biopsy today with Dr. Kathlene Cote. Patient is NPO. Afebrile. He does not take blood thinners. INR 1.1 seconds today.  Risks and benefits discussed with the patient including, but not limited to bleeding, infection, damage to adjacent structures or low yield requiring additional tests. All of the patient's questions were answered, patient is agreeable to proceed. Consent signed and in chart.   Thank you for this interesting consult.  I greatly enjoyed meeting John [name] and look forward to participating in their care.  A copy of this report was sent to the requesting provider on this date.  Electronically Signed: Earley Abide, PA-C 12/26/2018, 12:56 PM   I spent a total of 40 Minutes in face to face in clinical consultation, greater than 50% of which was counseling/coordinating care for liver lesion/biopsy.

## 2018-12-28 ENCOUNTER — Telehealth: Payer: Self-pay | Admitting: Hematology

## 2018-12-28 ENCOUNTER — Inpatient Hospital Stay: Payer: Medicare Other | Admitting: Hematology

## 2018-12-28 ENCOUNTER — Other Ambulatory Visit: Payer: Self-pay | Admitting: Hematology

## 2018-12-28 DIAGNOSIS — C221 Intrahepatic bile duct carcinoma: Secondary | ICD-10-CM

## 2018-12-28 NOTE — Progress Notes (Signed)
START OFF PATHWAY REGIMEN - [Other Dx]   [id number]:Cisplatin 25 mg/m2 D1,8 + Gemcitabine 1,000 mg/m2 D1,8 q21 Days:   A cycle is every 21 days:     Gemcitabine      Cisplatin   **Always confirm dose/schedule in your pharmacy ordering system**  Patient Characteristics: Intent of Therapy: Non-Curative / Palliative Intent, Discussed with Patient 

## 2018-12-28 NOTE — Telephone Encounter (Signed)
I called pt and discussed his liver biopsy results from 2 days ago, which showed poorly differentiated adenocarcinoma with necrosis, favor cholangiocarcinoma.  I have requested the biopsy tissue to be sent out for foundation one test.   I discussed the option of systemic therapy for cholangiocarcinoma, including chemotherapy, such as gemcitabine and cisplatin, versus targeted therapy, such as FGFR inhibitor or IDH inhibitor. Due to his liver cirrhosis and cytopenias, palliative care would be preferred.  If not available, patient is willing to take chemotherapy.  I will repeat abdomen MRI in 2-3 weeks, and set up chemo class, lab, f/u and chemo gemcitabine and cisplatin in 3 weeks. He agrees with the plan.  John [name]  12/28/2018

## 2018-12-29 ENCOUNTER — Inpatient Hospital Stay: Payer: Medicare Other | Admitting: Hematology

## 2018-12-29 ENCOUNTER — Other Ambulatory Visit: Payer: Self-pay

## 2019-01-02 ENCOUNTER — Telehealth: Payer: Self-pay | Admitting: *Deleted

## 2019-01-02 NOTE — Telephone Encounter (Signed)
Received message from [facility] that pt requested a call back to review his upcoming appt dates and times. TCT patient. No answer but was able to leave detailed vm message on identified phone with appt dates and times. Advised pt he could call back @ [phone] if he had any further questions.

## 2019-01-04 ENCOUNTER — Encounter: Payer: Self-pay | Admitting: Nurse Practitioner

## 2019-01-04 ENCOUNTER — Other Ambulatory Visit: Payer: Self-pay

## 2019-01-04 ENCOUNTER — Ambulatory Visit: Payer: Medicare Other | Attending: Nurse Practitioner | Admitting: Nurse Practitioner

## 2019-01-04 DIAGNOSIS — G894 Chronic pain syndrome: Secondary | ICD-10-CM | POA: Diagnosis not present

## 2019-01-04 DIAGNOSIS — M47816 Spondylosis without myelopathy or radiculopathy, lumbar region: Secondary | ICD-10-CM | POA: Diagnosis not present

## 2019-01-04 DIAGNOSIS — G893 Neoplasm related pain (acute) (chronic): Secondary | ICD-10-CM | POA: Diagnosis not present

## 2019-01-04 DIAGNOSIS — M4727 Other spondylosis with radiculopathy, lumbosacral region: Secondary | ICD-10-CM | POA: Diagnosis not present

## 2019-01-04 DIAGNOSIS — M7918 Myalgia, other site: Secondary | ICD-10-CM

## 2019-01-04 MED ORDER — OMEPRAZOLE 20 MG PO CPDR: 20.0000 mg | DELAYED_RELEASE_CAPSULE | Freq: Every day | ORAL | 0 refills | Status: DC

## 2019-01-04 MED ORDER — BACLOFEN 10 MG PO TABS: 10.0000 mg | ORAL_TABLET | Freq: Three times a day (TID) | ORAL | 0 refills | Status: DC

## 2019-01-04 MED ORDER — OXYCODONE HCL 5 MG PO TABS: 2.5000 mg | ORAL_TABLET | Freq: Two times a day (BID) | ORAL | 0 refills | Status: DC

## 2019-01-04 MED ORDER — GABAPENTIN 600 MG PO TABS: 600.0000 mg | ORAL_TABLET | Freq: Three times a day (TID) | ORAL | 0 refills | Status: DC

## 2019-01-04 MED ORDER — BISACODYL 5 MG PO TBEC: 5.0000 mg | DELAYED_RELEASE_TABLET | Freq: Every day | ORAL | 0 refills | Status: DC | PRN

## 2019-01-04 NOTE — Patient Instructions (Signed)
____________________________________________________________________________________________  Medication Rules  Purpose: To inform patients, and their family members, of our rules and regulations.  Applies to: All patients receiving prescriptions (written or electronic).  Pharmacy of record: Pharmacy where electronic prescriptions will be sent. If written prescriptions are taken to a different pharmacy, please inform the nursing staff. The pharmacy listed in the electronic medical record should be the one where you would like electronic prescriptions to be sent.  Electronic prescriptions: In compliance with the [place] Strengthen Opioid Misuse Prevention (STOP) Act of 2017 (Session Law 2017-74/H243), effective September 06, 2018, all controlled substances must be electronically prescribed. Calling prescriptions to the pharmacy will cease to exist.  Prescription refills: Only during scheduled appointments. Applies to all prescriptions.  NOTE: The following applies primarily to controlled substances (Opioid* Pain Medications).   Patient's responsibilities: 1. Pain Pills: Bring all pain pills to every appointment (except for procedure appointments). 2. Pill Bottles: Bring pills in original pharmacy bottle. Always bring the newest bottle. Bring bottle, even if empty. 3. Medication refills: You are responsible for knowing and keeping track of what medications you take and those you need refilled. The day before your appointment: write a list of all prescriptions that need to be refilled. The day of the appointment: give the list to the admitting nurse. Prescriptions will be written only during appointments. No prescriptions will be written on procedure days. If you forget a medication: it will not be "Called in", "Faxed", or "electronically sent". You will need to get another appointment to get these prescribed. No early refills. Do not call asking to have your prescription filled  early. 4. Prescription Accuracy: You are responsible for carefully inspecting your prescriptions before leaving our office. Have the discharge nurse carefully go over each prescription with you, before taking them home. Make sure that your name is accurately spelled, that your address is correct. Check the name and dose of your medication to make sure it is accurate. Check the number of pills, and the written instructions to make sure they are clear and accurate. Make sure that you are given enough medication to last until your next medication refill appointment. 5. Taking Medication: Take medication as prescribed. When it comes to controlled substances, taking less pills or less frequently than prescribed is permitted and encouraged. Never take more pills than instructed. Never take medication more frequently than prescribed.  6. Inform other Doctors: Always inform, all of your healthcare providers, of all the medications you take. 7. Pain Medication from other Providers: You are not allowed to accept any additional pain medication from any other Doctor or Healthcare provider. There are two exceptions to this rule. (see below) In the event that you require additional pain medication, you are responsible for notifying us, as stated below. 8. Medication Agreement: You are responsible for carefully reading and following our Medication Agreement. This must be signed before receiving any prescriptions from our practice. Safely store a copy of your signed Agreement. Violations to the Agreement will result in no further prescriptions. (Additional copies of our Medication Agreement are available upon request.) 9. Laws, Rules, & Regulations: All patients are expected to follow all Federal and State Laws, Statutes, Rules, & Regulations. Ignorance of the Laws does not constitute a valid excuse. The use of any illegal substances is prohibited. 10. Adopted CDC guidelines & recommendations: Target dosing levels will be  at or below 60 MME/day. Use of benzodiazepines** is not recommended.  Exceptions: There are only two exceptions to the rule of not   receiving pain medications from other Healthcare Providers. 1. Exception #1 (Emergencies): In the event of an emergency (i.e.: accident requiring emergency care), you are allowed to receive additional pain medication. However, you are responsible for: As soon as you are able, call our office [phone], at any time of the day or night, and leave a message stating your name, the date and nature of the emergency, and the name and dose of the medication prescribed. In the event that your call is answered by a member of our staff, make sure to document and save the date, time, and the name of the person that took your information.  2. Exception #2 (Planned Surgery): In the event that you are scheduled by another doctor or dentist to have any type of surgery or procedure, you are allowed (for a period no longer than 30 days), to receive additional pain medication, for the acute post-op pain. However, in this case, you are responsible for picking up a copy of our "Post-op Pain Management for Surgeons" handout, and giving it to your surgeon or dentist. This document is available at our office, and does not require an appointment to obtain it. Simply go to our office during business hours (Monday-Thursday from 8:00 AM to 4:00 PM) (Friday 8:00 AM to 12:00 Noon) or if you have a scheduled appointment with us, prior to your surgery, and ask for it by name. In addition, you will need to provide us with your name, name of your surgeon, type of surgery, and date of procedure or surgery.  *Opioid medications include: morphine, codeine, oxycodone, oxymorphone, hydrocodone, hydromorphone, meperidine, tramadol, tapentadol, buprenorphine, fentanyl, methadone. **Benzodiazepine medications include: diazepam (Valium), alprazolam (Xanax), clonazepam (Klonopine), lorazepam (Ativan), clorazepate  (Tranxene), chlordiazepoxide (Librium), estazolam (Prosom), oxazepam (Serax), temazepam (Restoril), triazolam (Halcion) (Last updated: 11/03/2017) ____________________________________________________________________________________________    

## 2019-01-04 NOTE — Progress Notes (Signed)
Pain Management Encounter Note - Virtual Visit via Telephone Telehealth (real-time audio visits between healthcare provider and patient).  Patient's Phone No. & Preferred Pharmacy:  [phone] (home); [phone] (mobile); (Preferred) [phone]  [id number] [address], [place] [address] Phone: [phone] Fax: [phone]  [place], [place] [facility] [address] [place] [address] [place] [address] Phone: [phone] Fax: [phone]   Pre-screening note:  Our staff contacted Mr. Staples and offered him an "in person", "face-to-face" appointment versus a telephone encounter. He indicated preferring the telephone encounter, at this time.  Reason for Virtual Visit: COVID-19*  Social distancing based on CDC and AMA recommendations.   I contacted Nonie Hoyer on 01/04/2019 at 9:46 AM by telephone and clearly identified myself as Dionisio David, NP. I verified that I was speaking with the correct person using two identifiers (Name and date of birth: 09/21/1955).  Advanced Informed Consent I sought verbal advanced consent from Nonie Hoyer for telemedicine interactions and virtual visit. I informed Mr. Fabiano of the security and privacy concerns, risks, and limitations associated with performing an evaluation and management service by telephone. I also informed Mr. Pavich of the availability of "in person" appointments and I informed him of the possibility of a patient responsible charge related to this service. Mr. Kissoon expressed understanding and agreed to proceed.   Historic Elements   Mr. TIMARION AGCAOILI is a 63 y.o. year old, male patient evaluated today after his last encounter by our practice on 10/10/2018. Mr. 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[place]) (06/23/2015), COPD (chronic obstructive pulmonary disease) ([place]), De Quervain's tenosynovitis, left (07/07/2017), Depression, Dystonia, Epileptic disorder ([place]) (06/23/2015), Extremity pain (05/06/2014), Febrile seizures ([place]), Foot pain (09/11/2014), Gastric ulcer (06/23/2015), Gout, H/O neoplasm (06/23/2015), Head injury (06/23/2015), Hemorrhoids, Hepatic cirrhosis ([place]) (06/23/2015), Hepatitis B, History of GI bleed, Leukopenia, Liver cancer ([place]) (08/2018), Neurogenic pain (01/21/2016), Nodule of upper lobe of left lung (12/02/2017), Non-small cell cancer of left lung ([place]) (2019), Obstructive sleep apnea (06/23/2015), Osteoarthritis of spine with radiculopathy, lumbar region (06/23/2015), Osteoarthritis of spine with radiculopathy, lumbosacral region (06/23/2015), Peripheral neuropathy (06/23/2015), Peripheral neuropathy ([place]) (71/02/2693), Personal history of tobacco use, presenting hazards to health (01/09/2016), Pulse irregularity, Seizures ([place]), Spinal stenosis, Splenomegaly (05/30/2013), Thrombocytopenia ([place]) (85/46/2703), and Uncomplicated opioid dependence ([place]) (06/23/2015). He also  has a past surgical history that includes surgery for cervical neck fracture; Esophagogastroduodenoscopy (11/2013); Tooth Extraction (Right, 09/28/2016); and Esophagogastroduodenoscopy (egd) with propofol (N/A, 06/23/2018). Mr. Achor has a current medication list which includes the following prescription(s): alprazolam, baclofen, bisacodyl, cetirizine, folic acid, gabapentin, omeprazole, oxycodone, oxycodone, and oxycodone. He  reports that he quit smoking about 14 months ago. His smoking use included cigarettes. He started smoking about 14 months ago. He has a 17.50 pack-year smoking history. His smokeless tobacco use includes snuff. He reports that he does not drink alcohol or use drugs. Mr. Adinolfi is allergic to codeine; codeine sulfate; morphine; and duloxetine.   HPI  I last saw him on  10/10/2018. He is being evaluated for medication management. He has 6/10 low back pain. He is also having right sided abdominal pain. He had to have a second liver biopsy. He is waiting to see the results. He is having swelling in his abdomen. He does feel like his pain is  getting worse. He was active on yesterday which causes pain. He denies any side effects of his current medication. He admits that he no longer uses the Magnesium because it cause him to feel different.   Pharmacotherapy Assessment  Analgesic:Oxycodone 2.5 one every 12 hours (5 mg/day of oxycodone) MME/day:7.5 mg/day  Monitoring: Pharmacotherapy: No side-effects or adverse reactions reported. [place] PMP: PDMP not reviewed this encounter.       Compliance: No problems identified. Plan: Refer to "POC".  Review of recent tests  [place] CORE BIOPSY (LIVER) INDICATION: Mass of the left lobe of the liver with previous biopsy on 05/05/2018 demonstrating adenocarcinoma and necrosis. There was not sufficient tissue for Foundation One testing. The mass has demonstrated significant progression in the liver and a second biopsy has been requested for molecular tests to guide treatment.  EXAM: ULTRASOUND GUIDED CORE BIOPSY OF LIVER  MEDICATIONS: None.  ANESTHESIA/SEDATION: Fentanyl 75 mcg IV; Versed 2.0 mg IV  Moderate Sedation Time:  17 minutes.  The patient was continuously monitored during the procedure by the interventional radiology nurse under my direct supervision.  PROCEDURE: The procedure, risks, benefits, and alternatives were explained to the patient. Questions regarding the procedure were encouraged and answered. The patient understands and consents to the procedure. A time-out was performed prior to initiating the procedure.  The abdominal wall was prepped with chlorhexidine in a sterile fashion, and a sterile drape was applied covering the operative field. A sterile gown and sterile gloves were used for  the procedure. Local anesthesia was provided with 1% Lidocaine.  Ultrasound was performed to localize a hepatic mass. Under ultrasound guidance, a 17 gauge trocar needle was advanced into the mass. Three separate coaxial 18 gauge core biopsy samples were then obtained and submitted in formalin.  COMPLICATIONS: None immediate.  FINDINGS: Large, lobulated hypoechoic mass of the left lobe of the liver was visualized by ultrasound. Solid tissue was obtained.  IMPRESSION: Ultrasound-guided core biopsy performed a large mass in the left lobe of the liver.  Electronically Signed   By: Aletta Edouard M.D.   On: 12/26/2018 15:21   Hospital Outpatient Visit on 12/26/2018  Component Date Value Ref Range Status  . WBC 12/26/2018 2.2* 4.0 - 10.5 K/uL Final  . RBC 12/26/2018 4.13* 4.22 - 5.81 MIL/uL Final  . Hemoglobin 12/26/2018 11.5* 13.0 - 17.0 g/dL Final  . HCT 12/26/2018 38.6* 39.0 - 52.0 % Final  . MCV 12/26/2018 93.5  80.0 - 100.0 fL Final  . MCH 12/26/2018 27.8  26.0 - 34.0 pg Final  . MCHC 12/26/2018 29.8* 30.0 - 36.0 g/dL Final  . RDW 12/26/2018 16.8* 11.5 - 15.5 % Final  . Platelets 12/26/2018 PLATELET CLUMPS NOTED ON SMEAR, UNABLE TO ESTIMATE  150 - 400 K/uL Final  . nRBC 12/26/2018 0.0  0.0 - 0.2 % Final   Performed at [facility], [place] [address] [place], [place] [address]  . Prothrombin Time 12/26/2018 14.5  11.4 - 15.2 seconds Final  . INR 12/26/2018 1.1  0.8 - 1.2 Final   Comment: (NOTE) INR goal varies based on device and disease states. Performed at [facility], [place] [address] [place], [place] [address]    Assessment  The primary encounter diagnosis was Lumbar spondylosis. Diagnoses of Musculoskeletal pain, Chronic pain syndrome, Osteoarthritis of spine with radiculopathy, lumbosacral region, and Cancer associated pain were also pertinent to this visit.  Plan of Care  I am having Pavan Bring. Alabi maintain his folic acid, ALPRAZolam, cetirizine,  oxyCODONE, oxyCODONE, oxyCODONE, baclofen, bisacodyl, gabapentin, and omeprazole.  Pharmacotherapy (Medications Ordered): Meds ordered this encounter  Medications  . oxyCODONE (OXY IR/ROXICODONE) 5 MG immediate release tablet    Sig: Take 0.5 tablets (2.5 mg total) by mouth 2 (two) times daily for 30 days.    Dispense:  30 tablet    Refill:  0    Do not place this medication, or any other prescription from our practice, on "Automatic Refill". Patient may have prescription filled one day early if pharmacy is closed on scheduled refill date.    Order Specific Question:   Supervising Provider    Answer:   Milinda Pointer [phone]  . oxyCODONE (OXY IR/ROXICODONE) 5 MG immediate release tablet    Sig: Take 0.5 tablets (2.5 mg total) by mouth 2 (two) times daily for 30 days.    Dispense:  30 tablet    Refill:  0    Do not place this medication, or any other prescription from our practice, on "Automatic Refill". Patient may have prescription filled one day early if pharmacy is closed on scheduled refill date.    Order Specific Question:   Supervising Provider    Answer:   Milinda Pointer [phone]  . oxyCODONE (OXY IR/ROXICODONE) 5 MG immediate release tablet    Sig: Take 0.5 tablets (2.5 mg total) by mouth 2 (two) times daily for 30 days.    Dispense:  30 tablet    Refill:  0    Do not place this medication, or any other prescription from our practice, on "Automatic Refill". Patient may have prescription filled one day early if pharmacy is closed on scheduled refill date.    Order Specific Question:   Supervising Provider    Answer:   Milinda Pointer [phone]  . baclofen (LIORESAL) 10 MG tablet    Sig: Take 1 tablet (10 mg total) by mouth 3 (three) times daily.    Dispense:  270 tablet    Refill:  0    Do not place this medication, or any other prescription from our practice, on "Automatic Refill". Patient may have prescription filled one day early if pharmacy is closed on scheduled  refill date.    Order Specific Question:   Supervising Provider    Answer:   Milinda Pointer [phone]  . bisacodyl (DULCOLAX) 5 MG EC tablet    Sig: Take 1 tablet (5 mg total) by mouth daily as needed for moderate constipation.    Dispense:  90 tablet    Refill:  0    Order Specific Question:   Supervising Provider    Answer:   Milinda Pointer [phone]  . gabapentin (NEURONTIN) 600 MG tablet    Sig: Take 1 tablet (600 mg total) by mouth 3 (three) times daily.    Dispense:  270 tablet    Refill:  0    Order Specific Question:   Supervising Provider    Answer:   Milinda Pointer [phone]  . omeprazole (PRILOSEC) 20 MG capsule    Sig: Take 1 capsule (20 mg total) by mouth daily.    Dispense:  90 capsule    Refill:  0    Order Specific Question:   Supervising Provider    Answer:   Milinda Pointer [phone]   Orders:  No orders of the defined types were placed in this encounter.  Follow-up plan:   Return in about 3 months (around 04/11/2019) for MedMgmt.   I discussed the assessment and treatment plan with the patient. The  patient was provided an opportunity to ask questions and all were answered. The patient agreed with the plan and demonstrated an understanding of the instructions.  Patient advised to call back or seek an in-person evaluation if the symptoms or condition worsens.  Total duration of non-face-to-face encounter: 15 minutes.  Note by: Dionisio David, NP Date: 01/04/2019; Time: 1:16 PM  Disclaimer:  * Given the special circumstances of the COVID-19 pandemic, the federal government has announced that the Office for Civil Rights (OCR) will exercise its enforcement discretion and will not impose penalties on physicians using telehealth in the event of noncompliance with regulatory requirements under the [place] (HIPAA) in connection with the good faith provision of telehealth during the OITGP-49 national public health  emergency. ([place])

## 2019-01-08 DIAGNOSIS — C221 Intrahepatic bile duct carcinoma: Secondary | ICD-10-CM | POA: Diagnosis not present

## 2019-01-15 ENCOUNTER — Ambulatory Visit
Admission: RE | Admit: 2019-01-15 | Discharge: 2019-01-15 | Disposition: A | Discharge status: Home / Self Care | Payer: Medicare Other | Source: Ambulatory Visit | Attending: Hematology | Admitting: Hematology

## 2019-01-15 ENCOUNTER — Other Ambulatory Visit: Payer: Self-pay

## 2019-01-15 DIAGNOSIS — C221 Intrahepatic bile duct carcinoma: Secondary | ICD-10-CM | POA: Diagnosis not present

## 2019-01-15 DIAGNOSIS — K7689 Other specified diseases of liver: Secondary | ICD-10-CM | POA: Diagnosis not present

## 2019-01-15 DIAGNOSIS — R590 Localized enlarged lymph nodes: Secondary | ICD-10-CM | POA: Diagnosis not present

## 2019-01-15 LAB — POCT I-STAT CREATININE: Creatinine, Ser: 0.8 mg/dL (ref 0.61–1.24)

## 2019-01-15 MED ORDER — GADOBUTROL 1 MMOL/ML IV SOLN
9.0000 mL | Freq: Once | INTRAVENOUS | Status: AC | PRN
  Administered 2019-01-15: 9 mL via INTRAVENOUS

## 2019-01-15 NOTE — Progress Notes (Signed)
John [name]   Telephone:(336) [phone] Fax:(336) [phone]   Clinic Follow up Note   Patient Care Team: Cletis Athens, MD as PCP - General (Internal Medicine) Manya Silvas, MD (Gastroenterology)  Date of Service:  01/17/2019  CHIEF COMPLAINT: F/u ofadenocarcinoma in liver  SUMMARY OF ONCOLOGIC HISTORY:   Cholangiocarcinoma ([place])   05/05/2018 Initial Biopsy    DIAGNOSIS: 05/05/18 A. LIVER MASS; ULTRASOUND-GUIDED BIOPSY:  - CYTOKERATIN 7 POSITIVE ADENOCARCINOMA WITH ABUNDANT NECROSIS, SEE  COMMENT.  Diagnosis 06/28/18 Consult- Comprehensive, [phone] Liver Bx - POORLY DIFFERENTIATED CARCINOMA INVOLVING LIVER PARENCHYMA. SEE NOTE. Diagnosis Note Most of the tumor shows a abortive glad formation, consistent with an adenocarcinoma. However, in some areas, the tumor cells show increased cytoplasm with a nested architecture, giving them a hepatoid appearance. Immunohistochemical stains performed at the outside institution show that the tumor cells are positive for CK7 and negative for TTF-1, Napsin-A, CDX-2, Hepar-1 and CD34. Immunostains performed at [facility] show that glypican-3 has patchy positive staining in the hepatoid-appearing tumor cells. Arginase and AFP stains show very focal, weak staining. Immunostain of pCEA shows focal canalicular-like staining pattern in the tumor cells. The findings are consistent with a cholangiocarcinoma but the the tumor histomorphology and staining pattern are suggestive of a hepatocellular carcinoma component, raising possibility of a combined hepatocellular cholangiocarcinoma. Additional biopsies or re-evaluation on the resection specimen is suggested for confirmation.    06/02/2018 PET scan    PET 06/02/18  IMPRESSION: 1. Solitary hypermetabolic lesion in the central LEFT hepatic lobe consistent with malignancy. 2. No metabolic activity remaining in LEFT apical pulmonary nodule which is reduced in size. 3. No evidence of  metastatic adenopathy on skull base to thigh FDG PET scan. 4. Cirrhotic liver.  Cholelithiasis 5.  Aortic Atherosclerosis (ICD10-I70.0).    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 Jearld Shines at [facility]  Estimated dose to liver target: 117 Gy Estimated dose to lung: 5.56 Gy Impression: Successful radioembolization with administration of intravascular yttrium-90 Theraspheres as described above.    10/18/2018 Imaging    US Abdomen 10/18/18 IMPRESSION: 1. Cholelithiasis without evidence of acute cholecystitis. 2. 2.2 cm left hepatic lobe mass consistent with known malignancy. 3. Cirrhosis and splenomegaly.    11/09/2018 Imaging    MRI Abdomen 11/09/18 at [facility]  Impression: Marked increased size of the mass centered in segment 4A with new satellite lesions seen in segments 2 and 3, likely representing multifocal mass-forming cholangiocarcinoma. New tumor invasion seen within the right anterior portal vein and middle hepatic vein to the level of the IVC, which is a pattern more commonly seen with hepatocellular carcinoma.    11/25/2018 Miscellaneous    Foundation One 3/21.2020  MSI -Stable - cannot be determined  Tumor Mutation Burden - cannot be determined  TERT promoter 124C>T TP53 [id number]    11/28/2018 Imaging    CT Chest 11/28/18  IMPRESSION: 1. Further improvement in left apical nodule, now nearly resolved. Mild surrounding radiation changes. 2. No new or enlarging pulmonary nodules. 3. Treated lesion in the left hepatic lobe is not optimally visualized on this noncontrast study, although appears slightly larger. 4. Progressive adenopathy in the upper abdomen. 5. Additional incidental findings are stable, including cirrhosis, splenomegaly, aortic valvular calcifications, Aortic Atherosclerosis (ICD10-I70.0) and Emphysema (ICD10-J43.9).    12/25/2018 Cancer Staging    Staging form:  Intrahepatic Bile Duct, AJCC 8th Edition - Clinical stage from 12/25/2018: Stage II (cT2(m),  cN0, cM0) - Signed by [name], MD on 12/27/2018    01/18/2019 -  Chemotherapy    First line Cisplatin and Gemcitabine every 2 weeks starting next week.       CURRENT THERAPY:  PENDING Chemo Gemcitabine and Cisplatin starting next week   INTERVAL HISTORY:  John [name] is here for a follow up of liver cancer. She presents to the clinic today by herself. He notes he is mostly stable. He notes he has constipation which is controlled with Doculax. He notes [facility] contacted him about another follow up and not sure what about. He is not sure what help he can get for transportation with the start of chemo.     REVIEW OF SYSTEMS:   Constitutional: Denies fevers, chills or abnormal weight loss Eyes: Denies blurriness of vision Ears, nose, mouth, throat, and face: Denies mucositis or sore throat Respiratory: Denies cough, dyspnea or wheezes Cardiovascular: Denies palpitation, chest discomfort or lower extremity swelling Gastrointestinal:  Denies nausea, heartburn  (+) Constipation  Skin: Denies abnormal skin rashes Lymphatics: Denies new lymphadenopathy or easy bruising Neurological:Denies numbness, tingling or new weaknesses Behavioral/Psych: Mood is stable, no new changes  All other systems were reviewed with the patient and are negative.  MEDICAL HISTORY:  Past Medical History:  Diagnosis Date  . Abnormal MRI, lumbar spine (2015) 01/21/2016   FINDINGS:  L3-4: Tiny right foraminal and extra foraminal disc bulge adjacent to but not compressing the L3 nerve lateral to the neural foramen. L4-5: There is a small broad-based disc bulge. There is also hypertrophy of the ligamentum flavum and facet joints, left more than right creating moderately severe spinal stenosis and bilateral lateral recess stenosis, left greater than right. This appe  . Anxiety   . Back ache 05/06/2014  . Cervical pain  05/06/2014  . 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[place]) 06/23/2015  . COPD (chronic obstructive pulmonary disease) ([place])   . De Quervain's tenosynovitis, left 07/07/2017  . Depression   . Dystonia   . Epileptic disorder ([place]) 06/23/2015  . Extremity pain 05/06/2014  . Febrile seizures ([place])    child  . Foot pain 09/11/2014  . Gastric ulcer 06/23/2015  . Gout   . H/O neoplasm 06/23/2015  . Head injury 06/23/2015  . Hemorrhoids   . Hepatic cirrhosis ([place]) 06/23/2015  . Hepatitis B   . History of GI bleed   . Leukopenia   . Liver cancer ([place]) 08/2018  . Neurogenic pain 01/21/2016  . Nodule of upper lobe of left lung 12/02/2017  . Non-small cell cancer of left lung ([place]) 2019   Rad tx's.   . Obstructive sleep apnea 06/23/2015  . 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 Osteoarthritis of spine with radiculopathy, lumbosacral region 06/23/2015  . Peripheral neuropathy 06/23/2015  . Peripheral neuropathy ([place]) 75/17/0017  . Personal history of tobacco use, presenting hazards to health 01/09/2016  . Pulse irregularity   . Seizures ([place])   . Spinal stenosis   . Splenomegaly 05/30/2013  . Thrombocytopenia ([place]) 06/23/2015  . Uncomplicated opioid dependence ([place]) 06/23/2015    SURGICAL HISTORY: Past Surgical History:  Procedure Laterality Date  . ESOPHAGOGASTRODUODENOSCOPY  11/2013  . ESOPHAGOGASTRODUODENOSCOPY (EGD) WITH PROPOFOL N/A 06/23/2018   Procedure: ESOPHAGOGASTRODUODENOSCOPY (EGD) WITH PROPOFOL;  Surgeon: Manya Silvas, MD;  Location[facility] ENDOSCOPY;  Service: Endoscopy;  Laterality: N/A;  . surgery for cervical neck fracture    . TOOTH EXTRACTION Right 09/28/2016    I have reviewed the social  history and family history with the patient and they are unchanged from previous note.  ALLERGIES:  is allergic to codeine; codeine sulfate; morphine; and duloxetine.  MEDICATIONS:  Current Outpatient Medications  Medication Sig Dispense Refill  .  ALPRAZolam (XANAX) 0.25 MG tablet Take 0.25 mg by mouth at bedtime as needed for anxiety or sleep.     . baclofen (LIORESAL) 10 MG tablet Take 1 tablet (10 mg total) by mouth 3 (three) times daily. 270 tablet 0  . bisacodyl (DULCOLAX) 5 MG EC tablet Take 1 tablet (5 mg total) by mouth daily as needed for moderate constipation. 90 tablet 0  . cetirizine (ZYRTEC) 10 MG tablet Take 10 mg by mouth daily.  3  . folic acid (FOLVITE) 1 MG tablet Take 1 mg by mouth daily.     Marland Kitchen gabapentin (NEURONTIN) 600 MG tablet Take 1 tablet (600 mg total) by mouth 3 (three) times daily. 270 tablet 0  . omeprazole (PRILOSEC) 20 MG capsule Take 1 capsule (20 mg total) by mouth daily. 90 capsule 0  . [START ON 04/03/2019] oxyCODONE (OXY IR/ROXICODONE) 5 MG immediate release tablet Take 0.5 tablets (2.5 mg total) by mouth 2 (two) times daily for 30 days. 30 tablet 0  . [START ON 03/04/2019] oxyCODONE (OXY IR/ROXICODONE) 5 MG immediate release tablet Take 0.5 tablets (2.5 mg total) by mouth 2 (two) times daily for 30 days. 30 tablet 0  . [START ON 02/02/2019] oxyCODONE (OXY IR/ROXICODONE) 5 MG immediate release tablet Take 0.5 tablets (2.5 mg total) by mouth 2 (two) times daily for 30 days. 30 tablet 0   No current facility-administered medications for this visit.     PHYSICAL EXAMINATION: ECOG PERFORMANCE STATUS: 1 - Symptomatic but completely ambulatory  Vitals:   01/17/19 0934  BP: (!) 125/58  Pulse: 93  Resp: 18  Temp: (!) 97.4 F (36.3 C)  SpO2: 98%   Filed Weights   01/17/19 0934  Weight: 211 lb 3.2 oz (95.8 kg)    GENERAL:alert, no distress and comfortable SKIN: skin color, texture, turgor are normal, no rashes or significant lesions EYES: normal, Conjunctiva are pink and non-injected, sclera clear OROPHARYNX:no exudate, no erythema and lips, buccal mucosa, and tongue normal  NECK: supple, thyroid normal size, non-tender, without nodularity LYMPH:  no palpable lymphadenopathy in the cervical, axillary  or inguinal LUNGS: clear to auscultation and percussion with normal breathing effort HEART: regular rate & rhythm and no murmurs and no lower extremity edema ABDOMEN:abdomen soft, non-tender and normal bowel sounds Musculoskeletal:no cyanosis of digits and no clubbing  NEURO: alert & oriented x 3 with fluent speech, no focal motor/sensory deficits  LABORATORY DATA:  I have reviewed the data as listed CBC Latest Ref Rng & Units 01/17/2019 12/26/2018 11/21/2018  WBC 4.0 - 10.5 K/uL 2.6(L) 2.2(L) 1.9(L)  Hemoglobin 13.0 - 17.0 g/dL 11.3(L) 11.5(L) 11.2(L)  Hematocrit 39.0 - 52.0 % 37.2(L) 38.6(L) 37.5(L)  Platelets 150 - 400 K/uL 62(L) PLATELET CLUMPS NOTED ON SMEAR, UNABLE TO ESTIMATE 65(L)     CMP Latest Ref Rng & Units 01/17/2019 01/15/2019 11/21/2018  Glucose 70 - 99 mg/dL 127(H) - 91  BUN 8 - 23 mg/dL 10 - 8  Creatinine 0.61 - 1.24 mg/dL 0.85 0.80 0.81  Sodium 135 - 145 mmol/L 141 - 142  Potassium 3.5 - 5.1 mmol/L 3.8 - 4.4  Chloride 98 - 111 mmol/L 101 - 102  CO2 22 - 32 mmol/L 31 - 29  Calcium 8.9 - 10.3 mg/dL 9.0 - 9.0  Total Protein 6.5 - 8.1 g/dL 8.2(H) - 8.0  Total Bilirubin 0.3 - 1.2 mg/dL 0.7 - 0.6  Alkaline Phos 38 - 126 U/L 97 - 92  AST 15 - 41 U/L 31 - 24  ALT 0 - 44 U/L 14 - 16      RADIOGRAPHIC STUDIES: I have personally reviewed the radiological images as listed and agreed with the findings in the report. No results found.   ASSESSMENT & PLAN:  John [name] is a 63 y.o. male with   1.Poorly differentiated adenocarcinoma in liver, probable cholangiocarcinoma, unresectable, MSI-pending  -He was diagnosed in 04/2018. His liver biopsy showed poorly differentiated adenocarcinoma, CK7 positive, other markers were negative. Upper endoscopy was negative for malignancy.PET scan otherwise negative for other primary tumor. -His biopsy was evaluated by our pathologistDr. Rogue Jury here, who feels this ismost consistent with cholangiocarcinoma, with possible  component of hepatocellular carcinoma. -Cancer type ID test showed 90% chance of pancreaticobiliary primary --He underwentY90 radio embolization on12/19/19with Dr. Jearld Shines at [facility].  -SurveillanceLiver MRI [facility] at [facility] showed disease progression in liver  -I have personally review his MRI Abdomen from 01/16/19 images which shows 10-15% reduce size of liver lesions from 11/28/18 CT, I discussed with pt.  -He underwent second liver biopsy which confirmed moderate to poorly differentiated adenocarcinoma. His Foundation One results show TP53 and TERT mutations which are not targetable. MSI was not able to done  -Given he is not eligible for target therapy, I recommend standard treatment of chemo with cisplatin and gemcitabine.  --Chemotherapy consent: Side effects including but does not not limited to, fatigue, nausea, vomiting, diarrhea, hair loss, neuropathy, hemorrhagic cystitis, fluid retention, renal and kidney dysfunction, neutropenic fever, needed for blood transfusion, bleeding, were discussed with patient in great detail. He agrees to proceed. -the goal of therapy is palliative to prolong his life  -Due to his moderate thrombocytopenia from liver cirrhosis, I will reduce his gemcitabine dose by 50% for first cycle, and give chemotherapy every 2 weeks, to reduce the risk of severe thrombosis and complications. -Will proceed with chemo education class today.  -Labs reviewed today, CBC and CMP WNL except, WBC 2.6, Hg 11.3, PLT 62K, BG 127. CA 19.9 still pending.  -[facility] can help with transportation if needed for treatment.  -first cycle chemo next week, lab and F/u in 2 weeks   2.Hepatitis B and liver cirrhosis with portal hypertension -He doesn't see a hepatologist nor an infectious disease physician,he has never received hepatitis B treatment. -will repeat Hep BS antigen, Santibody, core antibody, and a viral load -No varices on 06/2018 Endoscopy.  -His MRI abdomen from 01/16/19  shows Cirrhosis with portal hypertension, Varices and splenomegaly. Also shows thrombosis of middle hepatic vein.  -Given his thrombocytopenia I do not recommend anticoagulant given risk of bleeding.  -I encouraged him to return to GI Dr. Tiffany Kocher for management.  3. 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 -I discussed chemo therapy can further reduce his blood counts. Will start him on oral Promacta 42m daily to improve his thrombocytopenia. He is agreeable. We discussed potential side effects, especially small risk of thrombosis.  4. Left apical lungcancer stage IA,s/p SBRT, COPD -PET scanon 11/24/2017 revealed a hypermetabolic 1.0 cm left apical pulmonary nodulefavoring malignancy. Assuming non-small cell lung cancer this represents T1a N0 M0 disease (stage IA).Biopsy was not obtained -Underwent SBRT 01/05/18-02/01/18 -CT chest from 11/28/18 shows good improvement and no new nodules.  5. 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 Social Support  -He lives by himself.  -He has 2 brothers but 1 does not live very close. -I discussed our resources to help with transportation if needed.    PLAN: -I called in [place] today  -Proceed with chemo education class today  -Lab and chemo cisplatin and gemcitabine in 1 and 3 week  -Lab and f/u in 2 weeks  -will request MMR or MSI on his previous biopsy sample    No problem-specific Assessment & Plan notes found for this encounter.   No orders of the defined types were placed in this encounter.  All questions were answered. The patient knows to call the clinic with any problems, questions or concerns. No barriers to learning was detected. I spent 20 minutes counseling the patient face to face. The total time spent in the appointment was  25 minutes and more than 50% was on counseling and review of test results     [name], MD 01/17/2019   I, Joslyn Devon, am acting as scribe for [name], MD.   I have reviewed the above documentation for accuracy and completeness, and I agree with the above.

## 2019-01-17 ENCOUNTER — Other Ambulatory Visit: Payer: Self-pay

## 2019-01-17 ENCOUNTER — Telehealth: Payer: Self-pay | Admitting: Hematology

## 2019-01-17 ENCOUNTER — Inpatient Hospital Stay: Payer: Medicare Other | Attending: Hematology | Admitting: Hematology

## 2019-01-17 ENCOUNTER — Other Ambulatory Visit (HOSPITAL_COMMUNITY)
Admission: RE | Admit: 2019-01-17 | Discharge: 2019-01-17 | Disposition: A | Discharge status: Home / Self Care | Payer: Medicare Other | Source: Ambulatory Visit | Attending: Hematology | Admitting: Hematology

## 2019-01-17 ENCOUNTER — Encounter: Payer: Self-pay | Admitting: Hematology

## 2019-01-17 ENCOUNTER — Inpatient Hospital Stay: Payer: Medicare Other

## 2019-01-17 VITALS — BP 125/58 | HR 93 | Temp 97.4°F | Resp 18 | Ht 72.0 in | Wt 211.2 lb

## 2019-01-17 DIAGNOSIS — K7460 Unspecified cirrhosis of liver: Secondary | ICD-10-CM | POA: Insufficient documentation

## 2019-01-17 DIAGNOSIS — B1910 Unspecified viral hepatitis B without hepatic coma: Secondary | ICD-10-CM | POA: Diagnosis not present

## 2019-01-17 DIAGNOSIS — J449 Chronic obstructive pulmonary disease, unspecified: Secondary | ICD-10-CM | POA: Insufficient documentation

## 2019-01-17 DIAGNOSIS — D6959 Other secondary thrombocytopenia: Secondary | ICD-10-CM | POA: Insufficient documentation

## 2019-01-17 DIAGNOSIS — Z5111 Encounter for antineoplastic chemotherapy: Secondary | ICD-10-CM | POA: Diagnosis not present

## 2019-01-17 DIAGNOSIS — C229 Malignant neoplasm of liver, not specified as primary or secondary: Secondary | ICD-10-CM | POA: Insufficient documentation

## 2019-01-17 DIAGNOSIS — C220 Liver cell carcinoma: Secondary | ICD-10-CM | POA: Insufficient documentation

## 2019-01-17 DIAGNOSIS — C221 Intrahepatic bile duct carcinoma: Secondary | ICD-10-CM | POA: Diagnosis not present

## 2019-01-17 DIAGNOSIS — Z79899 Other long term (current) drug therapy: Secondary | ICD-10-CM | POA: Insufficient documentation

## 2019-01-17 DIAGNOSIS — K7030 Alcoholic cirrhosis of liver without ascites: Secondary | ICD-10-CM

## 2019-01-17 DIAGNOSIS — D696 Thrombocytopenia, unspecified: Secondary | ICD-10-CM

## 2019-01-17 LAB — CBC WITH DIFFERENTIAL (CANCER CENTER ONLY)
Abs Immature Granulocytes: 0.01 10*3/uL (ref 0.00–0.07)
Basophils Absolute: 0 10*3/uL (ref 0.0–0.1)
Basophils Relative: 0 %
Eosinophils Absolute: 0.1 10*3/uL (ref 0.0–0.5)
Eosinophils Relative: 2 %
HCT: 37.2 % — ABNORMAL LOW (ref 39.0–52.0)
Hemoglobin: 11.3 g/dL — ABNORMAL LOW (ref 13.0–17.0)
Immature Granulocytes: 0 %
Lymphocytes Relative: 13 %
Lymphs Abs: 0.3 10*3/uL — ABNORMAL LOW (ref 0.7–4.0)
MCH: 28.5 pg (ref 26.0–34.0)
MCHC: 30.4 g/dL (ref 30.0–36.0)
MCV: 93.9 fL (ref 80.0–100.0)
Monocytes Absolute: 0.2 10*3/uL (ref 0.1–1.0)
Monocytes Relative: 9 %
Neutro Abs: 2 10*3/uL (ref 1.7–7.7)
Neutrophils Relative %: 76 %
Platelet Count: 62 10*3/uL — ABNORMAL LOW (ref 150–400)
RBC: 3.96 MIL/uL — ABNORMAL LOW (ref 4.22–5.81)
RDW: 16.2 % — ABNORMAL HIGH (ref 11.5–15.5)
WBC Count: 2.6 10*3/uL — ABNORMAL LOW (ref 4.0–10.5)
nRBC: 0 % (ref 0.0–0.2)

## 2019-01-17 LAB — CMP (CANCER CENTER ONLY)
ALT: 14 U/L (ref 0–44)
AST: 31 U/L (ref 15–41)
Albumin: 3.8 g/dL (ref 3.5–5.0)
Alkaline Phosphatase: 97 U/L (ref 38–126)
Anion gap: 9 (ref 5–15)
BUN: 10 mg/dL (ref 8–23)
CO2: 31 mmol/L (ref 22–32)
Calcium: 9 mg/dL (ref 8.9–10.3)
Chloride: 101 mmol/L (ref 98–111)
Creatinine: 0.85 mg/dL (ref 0.61–1.24)
GFR, Est AFR Am: >60 mL/min (ref >60)
GFR, Estimated: >60 mL/min (ref >60)
Glucose, Bld: 127 mg/dL — ABNORMAL HIGH (ref 70–99)
Potassium: 3.8 mmol/L (ref 3.5–5.1)
Sodium: 141 mmol/L (ref 135–145)
Total Bilirubin: 0.7 mg/dL (ref 0.3–1.2)
Total Protein: 8.2 g/dL — ABNORMAL HIGH (ref 6.5–8.1)

## 2019-01-17 MED ORDER — ELTROMBOPAG OLAMINE 50 MG PO TABS: 50.0000 mg | ORAL_TABLET | Freq: Every day | ORAL | 2 refills | Status: DC

## 2019-01-17 NOTE — Telephone Encounter (Signed)
Scheduled appt per 5/13 los.  Added treatment to the book for approval.

## 2019-01-18 ENCOUNTER — Inpatient Hospital Stay: Payer: Medicare Other

## 2019-01-18 ENCOUNTER — Telehealth: Payer: Self-pay | Admitting: Pharmacist

## 2019-01-18 ENCOUNTER — Telehealth: Payer: Self-pay

## 2019-01-18 LAB — CANCER ANTIGEN 19-9: CA 19-9: 26 U/mL (ref 0–35)

## 2019-01-18 NOTE — Telephone Encounter (Signed)
Oral Oncology Pharmacist Encounter  Received new prescription for Promacta (eltrombopag) for the treatment of chronic hepatitis B infection and alcoholic hepatic cirrhosis associated thrombocytopenia, planned duration until adequate platelet response or unacceptable toxicity.  Labs from 01/17/2019 assessed, OK for treatment initiation.  Current medication list in Epic reviewed, no DDIs with Promacta identified.  Prescription has been e-scribed to the [facility] for benefits analysis and approval by MD.  Oral Oncology Clinic will continue to follow for insurance authorization, copayment issues, initial counseling and start date.  Johny Drilling, PharmD, BCPS, BCOP  01/18/2019 9:09 AM Oral Oncology Clinic [phone]

## 2019-01-18 NOTE — Telephone Encounter (Signed)
Oral Oncology Patient Advocate Encounter  Received notification from [employer] that prior authorization for Promacta is required.  PA submitted on CoverMyMeds Key [id number] Status is pending  Oral Oncology Clinic will continue to follow.  [place] Patient [place] Phone [phone] Fax [phone] 01/18/2019    12:17 PM

## 2019-01-18 NOTE — Telephone Encounter (Signed)
Oral Oncology Pharmacist Encounter  Received new referral for Doptelet (avatrombopag) for the treatment of chronic liver disease-associated thrombocytopenia, planned duration until adequate platelet response or unacceptable toxicity.  Doptelet will likely be initiated at 20mg  daily and may be increased to 60mg  once daily based on platelet response  Labs from 01/17/2019 assessed, OK for treatment initiation.  Current medication list in Epic reviewed, no DDIs with Doptelet identified.  Prescription will be e-scribed to the [facility] for benefits analysis and approval.  Oral Oncology Clinic will continue to follow for insurance authorization, copayment issues, initial counseling and start date.  Johny Drilling, PharmD, BCPS, BCOP  01/18/2019 1:22 PM Oral Oncology Clinic [phone]

## 2019-01-19 ENCOUNTER — Other Ambulatory Visit: Payer: Self-pay | Admitting: Hematology

## 2019-01-19 ENCOUNTER — Telehealth: Payer: Self-pay

## 2019-01-19 MED ORDER — AVATROMBOPAG MALEATE 20 MG PO TABS: 20.0000 mg | ORAL_TABLET | Freq: Every day | ORAL | 0 refills | Status: DC

## 2019-01-19 NOTE — Telephone Encounter (Signed)
Oral Oncology Patient Advocate Encounter  Received notification from [employer] that prior authorization for Doptelet is required.  PA submitted on CoverMyMeds Key [id number] Status is pending  Oral Oncology Clinic will continue to follow.  [place] Patient [place] Phone [phone] Fax [phone] 01/19/2019    10:23 AM

## 2019-01-19 NOTE — Telephone Encounter (Signed)
Oral Oncology Patient Advocate Encounter  Prior Authorization for Doptelet has been approved.    PA# [id number] Effective dates: 01/19/19 through 09/06/19  Oral Oncology Clinic will continue to follow.   [place] Patient [place] Phone [phone] Fax [phone] 01/19/2019    11:59 AM

## 2019-01-19 NOTE — Telephone Encounter (Signed)
Oral Oncology Patient Advocate Encounter  Received notification from [employer] that the request for prior authorization for Promacta has been denied due to not being on label.    Changed to Doptelet. Will put this PA in separate encounter.  [place] Patient [place] Phone [phone] Fax [phone] 01/19/2019    10:24 AM

## 2019-01-19 NOTE — Telephone Encounter (Signed)
Oral Oncology Patient Advocate Encounter  Doptelet copay is [phone], this is not affordable for the patient. I completed an application for [employer] in an effort to reduce patient's out of pocket expense for Doptelet to $0.    Application completed and faxed to [phone].   [employer] patient assistance phone number for follow up is [phone].   I spoke with Broadus John at [place] and they are verifying income and permission for application to be submitted with the patient over the phone.  I called the patient and he agreed to submit [employer] application. He verbalized understanding and great appreciation.  This encounter will be updated until final determination.   [place] Patient [place] Phone [phone] Fax [phone] 01/19/2019    3:32 PM

## 2019-01-22 NOTE — Telephone Encounter (Signed)
Oral Oncology Pharmacist [employer] authorization for Promacta denied. This decision will not be appealed as insurance has approved use of Doptelet (avatrombopag). Doptelet information will be documented in a separate encounter.  Johny Drilling, PharmD, BCPS, BCOP  01/22/2019 2:37 PM Oral Oncology Clinic [phone]

## 2019-01-23 NOTE — Telephone Encounter (Signed)
Oral Oncology Patient Advocate Encounter  I called [employer] to follow up on the [place] patient assistance application. The representative stated that they were able to get verbal consent from the patient. The application is now in the final step of processing and should have a decision by tomorrow.  This encounter will be updated until final determination.  [place] Patient [place] Phone [phone] Fax [phone] 01/23/2019   1:57 PM

## 2019-01-24 ENCOUNTER — Other Ambulatory Visit: Payer: Medicare Other

## 2019-01-24 ENCOUNTER — Inpatient Hospital Stay: Payer: Medicare Other

## 2019-01-24 ENCOUNTER — Other Ambulatory Visit: Payer: Self-pay | Admitting: Medical

## 2019-01-24 ENCOUNTER — Ambulatory Visit: Payer: Medicare Other

## 2019-01-24 ENCOUNTER — Other Ambulatory Visit: Payer: Self-pay

## 2019-01-24 ENCOUNTER — Ambulatory Visit (HOSPITAL_BASED_OUTPATIENT_CLINIC_OR_DEPARTMENT_OTHER): Payer: Medicare Other | Admitting: Medical

## 2019-01-24 VITALS — BP 112/57 | HR 55 | Temp 97.8°F | Resp 15

## 2019-01-24 DIAGNOSIS — D6959 Other secondary thrombocytopenia: Secondary | ICD-10-CM | POA: Diagnosis not present

## 2019-01-24 DIAGNOSIS — C220 Liver cell carcinoma: Secondary | ICD-10-CM

## 2019-01-24 DIAGNOSIS — Z79899 Other long term (current) drug therapy: Secondary | ICD-10-CM

## 2019-01-24 DIAGNOSIS — C221 Intrahepatic bile duct carcinoma: Secondary | ICD-10-CM

## 2019-01-24 DIAGNOSIS — B1910 Unspecified viral hepatitis B without hepatic coma: Secondary | ICD-10-CM

## 2019-01-24 DIAGNOSIS — K7460 Unspecified cirrhosis of liver: Secondary | ICD-10-CM

## 2019-01-24 DIAGNOSIS — C229 Malignant neoplasm of liver, not specified as primary or secondary: Secondary | ICD-10-CM

## 2019-01-24 DIAGNOSIS — J449 Chronic obstructive pulmonary disease, unspecified: Secondary | ICD-10-CM | POA: Diagnosis not present

## 2019-01-24 DIAGNOSIS — Z5111 Encounter for antineoplastic chemotherapy: Secondary | ICD-10-CM | POA: Diagnosis not present

## 2019-01-24 LAB — CBC WITH DIFFERENTIAL (CANCER CENTER ONLY)
Abs Immature Granulocytes: 0.01 10*3/uL (ref 0.00–0.07)
Basophils Absolute: 0 10*3/uL (ref 0.0–0.1)
Basophils Relative: 0 %
Eosinophils Absolute: 0.1 10*3/uL (ref 0.0–0.5)
Eosinophils Relative: 3 %
HCT: 37.1 % — ABNORMAL LOW (ref 39.0–52.0)
Hemoglobin: 11.6 g/dL — ABNORMAL LOW (ref 13.0–17.0)
Immature Granulocytes: 0 %
Lymphocytes Relative: 16 %
Lymphs Abs: 0.4 10*3/uL — ABNORMAL LOW (ref 0.7–4.0)
MCH: 28.3 pg (ref 26.0–34.0)
MCHC: 31.3 g/dL (ref 30.0–36.0)
MCV: 90.5 fL (ref 80.0–100.0)
Monocytes Absolute: 0.3 10*3/uL (ref 0.1–1.0)
Monocytes Relative: 12 %
Neutro Abs: 1.8 10*3/uL (ref 1.7–7.7)
Neutrophils Relative %: 69 %
Platelet Count: 71 10*3/uL — ABNORMAL LOW (ref 150–400)
RBC: 4.1 MIL/uL — ABNORMAL LOW (ref 4.22–5.81)
RDW: 15.8 % — ABNORMAL HIGH (ref 11.5–15.5)
WBC Count: 2.7 10*3/uL — ABNORMAL LOW (ref 4.0–10.5)
nRBC: 0 % (ref 0.0–0.2)

## 2019-01-24 LAB — CMP (CANCER CENTER ONLY)
ALT: 14 U/L (ref 0–44)
AST: 20 U/L (ref 15–41)
Albumin: 3.9 g/dL (ref 3.5–5.0)
Alkaline Phosphatase: 99 U/L (ref 38–126)
Anion gap: 10 (ref 5–15)
BUN: 7 mg/dL — ABNORMAL LOW (ref 8–23)
CO2: 27 mmol/L (ref 22–32)
Calcium: 9.1 mg/dL (ref 8.9–10.3)
Chloride: 103 mmol/L (ref 98–111)
Creatinine: 0.83 mg/dL (ref 0.61–1.24)
GFR, Est AFR Am: >60 mL/min (ref >60)
GFR, Estimated: >60 mL/min (ref >60)
Glucose, Bld: 92 mg/dL (ref 70–99)
Potassium: 4.1 mmol/L (ref 3.5–5.1)
Sodium: 140 mmol/L (ref 135–145)
Total Bilirubin: 0.6 mg/dL (ref 0.3–1.2)
Total Protein: 8 g/dL (ref 6.5–8.1)

## 2019-01-24 MED ORDER — DEXAMETHASONE SODIUM PHOSPHATE 10 MG/ML IJ SOLN
10.0000 mg | Freq: Once | INTRAMUSCULAR | Status: AC
  Administered 2019-01-24: 10 mg via INTRAVENOUS

## 2019-01-24 MED ORDER — POTASSIUM CHLORIDE 2 MEQ/ML IV SOLN
Freq: Once | INTRAVENOUS | Status: AC
  Administered 2019-01-24: 10:00:00 via INTRAVENOUS
  Filled 2019-01-24: qty 10

## 2019-01-24 MED ORDER — PALONOSETRON HCL INJECTION 0.25 MG/5ML
INTRAVENOUS | Status: AC
  Filled 2019-01-24: qty 5

## 2019-01-24 MED ORDER — SODIUM CHLORIDE 0.9 % IV SOLN
600.0000 mg/m2 | Freq: Once | INTRAVENOUS | Status: AC
  Administered 2019-01-24: 1292 mg via INTRAVENOUS
  Filled 2019-01-24: qty 33.98

## 2019-01-24 MED ORDER — DEXAMETHASONE SODIUM PHOSPHATE 10 MG/ML IJ SOLN
INTRAMUSCULAR | Status: AC
  Filled 2019-01-24: qty 1

## 2019-01-24 MED ORDER — SODIUM CHLORIDE 0.9 % IV SOLN
Freq: Once | INTRAVENOUS | Status: AC
  Administered 2019-01-24: 10:00:00 via INTRAVENOUS
  Filled 2019-01-24: qty 250

## 2019-01-24 MED ORDER — SODIUM CHLORIDE 0.9 % IV SOLN
25.0000 mg/m2 | Freq: Once | INTRAVENOUS | Status: AC
  Administered 2019-01-24: 54 mg via INTRAVENOUS
  Filled 2019-01-24: qty 54

## 2019-01-24 MED ORDER — PALONOSETRON HCL INJECTION 0.25 MG/5ML
0.2500 mg | Freq: Once | INTRAVENOUS | Status: AC
  Administered 2019-01-24: 0.25 mg via INTRAVENOUS

## 2019-01-24 MED ORDER — SODIUM CHLORIDE 0.9 % IV SOLN
Freq: Once | INTRAVENOUS | Status: AC
  Administered 2019-01-24: 12:00:00 via INTRAVENOUS
  Filled 2019-01-24: qty 5

## 2019-01-24 MED ORDER — FAMOTIDINE IN NACL 20-0.9 MG/50ML-% IV SOLN
20.0000 mg | Freq: Once | INTRAVENOUS | Status: AC
  Administered 2019-01-24: 20 mg via INTRAVENOUS

## 2019-01-24 MED ORDER — DIPHENHYDRAMINE HCL 50 MG/ML IJ SOLN
25.0000 mg | Freq: Once | INTRAMUSCULAR | Status: AC
  Administered 2019-01-24: 25 mg via INTRAVENOUS

## 2019-01-24 MED ORDER — SODIUM CHLORIDE 0.9 % IV SOLN: 10.0000 mg | Freq: Once | INTRAVENOUS | Status: DC

## 2019-01-24 NOTE — Progress Notes (Signed)
Per Dr. Burr Medico, ok to tx with platelets 71 today.  At 1202 Emend + Decadron paused r/t patient complaint of lower back pain, and hot flash. Patient was flushed and noticeably SOB. Hypersensitivity protocol started with NS open to gravity, and [employer], PA-C to tx area. Benadryl, Pepcid, and Decadron given; see MAR. See vital sign flowsheet. Per Lucianne Lei, okay to proceed with tx.

## 2019-01-24 NOTE — Progress Notes (Signed)
    DATE:  01/24/2019                                          X  CHEMO/IMMUNOTHERAPY REACTION             MD:  Dr. [name]   AGENT/BLOOD PRODUCT RECEIVING TODAY:               Aloxi, Emend and dexamethasone   AGENT/BLOOD PRODUCT RECEIVING IMMEDIATELY PRIOR TO REACTION:           Emend and dexamethasone (approximately 20 mL)   VS: BP:      162/94   P:        75       SPO2:        95% on room air                BP:      126/57   P:        63       SPO2:        100% on room air     REACTION(S):           Back pain, shortness of breath, and hot flashes   PREMEDS:      Aloxi, Emend, and dexamethasone  INTERVENTION: Emend and dexamethasone were stopped.  The patient was given Benadryl 25 mg IV x1, Pepcid 20 mg IV x1, and dexamethasone 10 mg IV x1.   Review of Systems  Review of Systems  Constitutional: Negative for chills, diaphoresis and fever.       Hot flashes  HENT: Negative for trouble swallowing and voice change.   Respiratory: Positive for shortness of breath. Negative for cough, chest tightness and wheezing.   Cardiovascular: Negative for chest pain and palpitations.  Gastrointestinal: Negative for abdominal pain, constipation, diarrhea, nausea and vomiting.  Musculoskeletal: Positive for back pain. Negative for myalgias.  Neurological: Negative for dizziness, light-headedness and headaches.     Physical Exam  Physical Exam Constitutional:      General: He is not in acute distress.    Appearance: He is not diaphoretic.  HENT:     Head: Normocephalic and atraumatic.  Cardiovascular:     Rate and Rhythm: Normal rate and regular rhythm.     Heart sounds: Normal heart sounds. No murmur. No friction rub. No gallop.   Pulmonary:     Effort: Pulmonary effort is normal. No respiratory distress.     Breath sounds: Normal breath sounds. No wheezing or rales.  Skin:    General: Skin is warm and dry.     Findings: No erythema or rash.  Neurological:     Mental Status: He is  alert.     OUTCOME:                 The patient's symptoms resolved.  He was able to then begin chemotherapy with cisplatin and gemcitabine.   Sandi Mealy, MHS, PA-C  This case was discussed with Dr. Burr Medico. She expressed agreement with my management of this patient.

## 2019-01-24 NOTE — Patient Instructions (Signed)
[facility] Discharge Instructions for Patients Receiving Chemotherapy  Today you received the following chemotherapy agents: Gemzar and Cisplatin.  To help prevent nausea and vomiting after your treatment, we encourage you to take your nausea medication as prescribed.   If you develop nausea and vomiting that is not controlled by your nausea medication, call the clinic.   BELOW ARE SYMPTOMS THAT SHOULD BE REPORTED IMMEDIATELY:  *FEVER GREATER THAN 100.5 F  *CHILLS WITH OR WITHOUT FEVER  NAUSEA AND VOMITING THAT IS NOT CONTROLLED WITH YOUR NAUSEA MEDICATION  *UNUSUAL SHORTNESS OF BREATH  *UNUSUAL BRUISING OR BLEEDING  TENDERNESS IN MOUTH AND THROAT WITH OR WITHOUT PRESENCE OF ULCERS  *URINARY PROBLEMS  *BOWEL PROBLEMS  UNUSUAL RASH Items with * indicate a potential emergency and should be followed up as soon as possible.  Feel free to call the clinic should you have any questions or concerns. The clinic phone number is [phone].  Please show the [facility] at check-in to the Emergency Department and triage nurse.    Gemcitabine injection (Gemzar) What is this medicine? GEMCITABINE (jem SYE ta been) is a chemotherapy drug. This medicine is used to treat many types of cancer like breast cancer, lung cancer, pancreatic cancer, and ovarian cancer. This medicine may be used for other purposes; ask your health care provider or pharmacist if you have questions. COMMON BRAND NAME(S): Gemzar, Infugem What should I tell my health care provider before I take this medicine? They need to know if you have any of these conditions: -blood disorders -infection -kidney disease -liver disease -lung or breathing disease, like asthma -recent or ongoing radiation therapy -an unusual or allergic reaction to gemcitabine, other chemotherapy, other medicines, foods, dyes, or preservatives -pregnant or trying to get pregnant -breast-feeding How should I use this  medicine? This drug is given as an infusion into a vein. It is administered in a hospital or clinic by a specially trained health care professional. Talk to your pediatrician regarding the use of this medicine in children. Special care may be needed. Overdosage: If you think you have taken too much of this medicine contact a poison control center or emergency room at once. NOTE: This medicine is only for you. Do not share this medicine with others. What if I miss a dose? It is important not to miss your dose. Call your doctor or health care professional if you are unable to keep an appointment. What may interact with this medicine? -medicines to increase blood counts like filgrastim, pegfilgrastim, sargramostim -some other chemotherapy drugs like cisplatin -vaccines Talk to your doctor or health care professional before taking any of these medicines: -acetaminophen -aspirin -ibuprofen -ketoprofen -naproxen This list may not describe all possible interactions. Give your health care provider a list of all the medicines, herbs, non-prescription drugs, or dietary supplements you use. Also tell them if you smoke, drink alcohol, or use illegal drugs. Some items may interact with your medicine. What should I watch for while using this medicine? Visit your doctor for checks on your progress. This drug may make you feel generally unwell. This is not uncommon, as chemotherapy can affect healthy cells as well as cancer cells. Report any side effects. Continue your course of treatment even though you feel ill unless your doctor tells you to stop. In some cases, you may be given additional medicines to help with side effects. Follow all directions for their use. Call your doctor or health care professional for advice if you get a  fever, chills or sore throat, or other symptoms of a cold or flu. Do not treat yourself. This drug decreases your body's ability to fight infections. Try to avoid being around  people who are sick. This medicine may increase your risk to bruise or bleed. Call your doctor or health care professional if you notice any unusual bleeding. Be careful brushing and flossing your teeth or using a toothpick because you may get an infection or bleed more easily. If you have any dental work done, tell your dentist you are receiving this medicine. Avoid taking products that contain aspirin, acetaminophen, ibuprofen, naproxen, or ketoprofen unless instructed by your doctor. These medicines may hide a fever. Do not become pregnant while taking this medicine or for 6 months after stopping it. Women should inform their doctor if they wish to become pregnant or think they might be pregnant. Men should not father a child while taking this medicine and for 3 months after stopping it. There is a potential for serious side effects to an unborn child. Talk to your health care professional or pharmacist for more information. Do not breast-feed an infant while taking this medicine or for at least 1 week after stopping it. Men should inform their doctors if they wish to father a child. This medicine may lower sperm counts. Talk with your doctor or health care professional if you are concerned about your fertility. What side effects may I notice from receiving this medicine? Side effects that you should report to your doctor or health care professional as soon as possible: -allergic reactions like skin rash, itching or hives, swelling of the face, lips, or tongue -breathing problems -pain, redness, or irritation at site where injected -signs and symptoms of a dangerous change in heartbeat or heart rhythm like chest pain; dizziness; fast or irregular heartbeat; palpitations; feeling faint or lightheaded, falls; breathing problems -signs of decreased platelets or bleeding - bruising, pinpoint red spots on the skin, black, tarry stools, blood in the urine -signs of decreased red blood cells - unusually  weak or tired, feeling faint or lightheaded, falls -signs of infection - fever or chills, cough, sore throat, pain or difficulty passing urine -signs and symptoms of kidney injury like trouble passing urine or change in the amount of urine -signs and symptoms of liver injury like dark yellow or brown urine; general ill feeling or flu-like symptoms; light-colored stools; loss of appetite; nausea; right upper belly pain; unusually weak or tired; yellowing of the eyes or skin -swelling of ankles, feet, hands Side effects that usually do not require medical attention (report to your doctor or health care professional if they continue or are bothersome): -constipation -diarrhea -hair loss -loss of appetite -nausea -rash -vomiting This list may not describe all possible side effects. Call your doctor for medical advice about side effects. You may report side effects to FDA at 1-800-FDA-1088. Where should I keep my medicine? This drug is given in a hospital or clinic and will not be stored at home. NOTE: This sheet is a summary. It may not cover all possible information. If you have questions about this medicine, talk to your doctor, pharmacist, or health care provider.  2019 Elsevier/Gold Standard (2017-11-16 18:06:11)  Cisplatin injection What is this medicine? CISPLATIN (SIS pla tin) is a chemotherapy drug. It targets fast dividing cells, like cancer cells, and causes these cells to die. This medicine is used to treat many types of cancer like bladder, ovarian, and testicular cancers. This medicine may be used  for other purposes; ask your health care provider or pharmacist if you have questions. COMMON BRAND NAME(S): Platinol, Platinol -AQ What should I tell my health care provider before I take this medicine? They need to know if you have any of these conditions: -blood disorders -hearing problems -kidney disease -recent or ongoing radiation therapy -an unusual or allergic reaction to  cisplatin, carboplatin, other chemotherapy, other medicines, foods, dyes, or preservatives -pregnant or trying to get pregnant -breast-feeding How should I use this medicine? This drug is given as an infusion into a vein. It is administered in a hospital or clinic by a specially trained health care professional. Talk to your pediatrician regarding the use of this medicine in children. Special care may be needed. Overdosage: If you think you have taken too much of this medicine contact a poison control center or emergency room at once. NOTE: This medicine is only for you. Do not share this medicine with others. What if I miss a dose? It is important not to miss a dose. Call your doctor or health care professional if you are unable to keep an appointment. What may interact with this medicine? -dofetilide -foscarnet -medicines for seizures -medicines to increase blood counts like filgrastim, pegfilgrastim, sargramostim -probenecid -pyridoxine used with altretamine -rituximab -some antibiotics like amikacin, gentamicin, neomycin, polymyxin B, streptomycin, tobramycin -sulfinpyrazone -vaccines -zalcitabine Talk to your doctor or health care professional before taking any of these medicines: -acetaminophen -aspirin -ibuprofen -ketoprofen -naproxen This list may not describe all possible interactions. Give your health care provider a list of all the medicines, herbs, non-prescription drugs, or dietary supplements you use. Also tell them if you smoke, drink alcohol, or use illegal drugs. Some items may interact with your medicine. What should I watch for while using this medicine? Your condition will be monitored carefully while you are receiving this medicine. You will need important blood work done while you are taking this medicine. This drug may make you feel generally unwell. This is not uncommon, as chemotherapy can affect healthy cells as well as cancer cells. Report any side effects.  Continue your course of treatment even though you feel ill unless your doctor tells you to stop. In some cases, you may be given additional medicines to help with side effects. Follow all directions for their use. Call your doctor or health care professional for advice if you get a fever, chills or sore throat, or other symptoms of a cold or flu. Do not treat yourself. This drug decreases your body's ability to fight infections. Try to avoid being around people who are sick. This medicine may increase your risk to bruise or bleed. Call your doctor or health care professional if you notice any unusual bleeding. Be careful brushing and flossing your teeth or using a toothpick because you may get an infection or bleed more easily. If you have any dental work done, tell your dentist you are receiving this medicine. Avoid taking products that contain aspirin, acetaminophen, ibuprofen, naproxen, or ketoprofen unless instructed by your doctor. These medicines may hide a fever. Do not become pregnant while taking this medicine. Women should inform their doctor if they wish to become pregnant or think they might be pregnant. There is a potential for serious side effects to an unborn child. Talk to your health care professional or pharmacist for more information. Do not breast-feed an infant while taking this medicine. Drink fluids as directed while you are taking this medicine. This will help protect your kidneys. Call your doctor  or health care professional if you get diarrhea. Do not treat yourself. What side effects may I notice from receiving this medicine? Side effects that you should report to your doctor or health care professional as soon as possible: -allergic reactions like skin rash, itching or hives, swelling of the face, lips, or tongue -signs of infection - fever or chills, cough, sore throat, pain or difficulty passing urine -signs of decreased platelets or bleeding - bruising, pinpoint red spots  on the skin, black, tarry stools, nosebleeds -signs of decreased red blood cells - unusually weak or tired, fainting spells, lightheadedness -breathing problems -changes in hearing -gout pain -low blood counts - This drug may decrease the number of white blood cells, red blood cells and platelets. You may be at increased risk for infections and bleeding. -nausea and vomiting -pain, swelling, redness or irritation at the injection site -pain, tingling, numbness in the hands or feet -problems with balance, movement -trouble passing urine or change in the amount of urine Side effects that usually do not require medical attention (report to your doctor or health care professional if they continue or are bothersome): -changes in vision -loss of appetite -metallic taste in the mouth or changes in taste This list may not describe all possible side effects. Call your doctor for medical advice about side effects. You may report side effects to FDA at 1-800-FDA-1088. Where should I keep my medicine? This drug is given in a hospital or clinic and will not be stored at home. NOTE: This sheet is a summary. It may not cover all possible information. If you have questions about this medicine, talk to your doctor, pharmacist, or health care provider.  2019 Elsevier/Gold Standard (2007-11-28 14:40:54)

## 2019-01-25 ENCOUNTER — Other Ambulatory Visit: Payer: Self-pay

## 2019-01-25 ENCOUNTER — Telehealth: Payer: Self-pay

## 2019-01-25 DIAGNOSIS — C221 Intrahepatic bile duct carcinoma: Secondary | ICD-10-CM

## 2019-01-25 MED ORDER — PROCHLORPERAZINE MALEATE 10 MG PO TABS: 10.0000 mg | ORAL_TABLET | Freq: Four times a day (QID) | ORAL | 0 refills | Status: DC | PRN

## 2019-01-25 MED ORDER — ONDANSETRON HCL 8 MG PO TABS: 8.0000 mg | ORAL_TABLET | Freq: Three times a day (TID) | ORAL | 0 refills | Status: AC | PRN

## 2019-01-25 NOTE — Progress Notes (Signed)
comn

## 2019-01-25 NOTE — Telephone Encounter (Signed)
Oral Oncology Patient Advocate Encounter  I received a call from [place] stating that in order for the patient to be approved the patient would need to provide more information.  They need either proof of income or a refill on the application in order to help the "max out of pocket cost" based on how they calculate this. The representative was having a difficult time explaining this to me but she did say that if the refill was on the application, proof of income is not needed. Dr. Burr Medico said it was ok for me to put a refill on the application.  They also need a print out of the patients list of medications and how much he spent in the last year. This will need to be over $200 in order to be approved. After speaking with the patient I called [facility] in [place] to see if they could fax me a copy of his medicines and the cost. They could not do that even with his approval but they could tell me that he has paid $212.98 for the year.   I called the patient and went over this information with him. The patient is not interested in leaving his house to get this information or bring it to me, he said that he had a reaction to his infusion yesterday and is still not feeling well today. He does not have a way to fax or email me the information. [facility] can mail the list of medicines to him but he would still need to get it to me. He said he would be coming in on 5/27, if he cannot get the information to me before then he would bring it that day.   The patient stated he would figure out what he wanted to do and call me back.  This encounter will be updated until final determination.   [facility] [place] Phone [phone] Fax [phone] 01/25/2019    11:54 AM

## 2019-01-25 NOTE — Telephone Encounter (Signed)
Spoke with patient as a chemotherapy follow up call.  He states is is eating and drinking, denies any vomiting, no diarrhea, mild nausea.  He does not have any nausea medication at home.  I have sent in scripts for compazine and Zofran and advised him on how to take.  He denies any other symptoms.  I have instructed him to call if he has concerns or worsening side effects, he verbalized an understanding and was appreciative of the call.

## 2019-01-26 ENCOUNTER — Other Ambulatory Visit: Payer: Medicare Other

## 2019-01-26 ENCOUNTER — Ambulatory Visit: Payer: Medicare Other

## 2019-01-26 NOTE — Progress Notes (Signed)
[place]   Telephone:([phone] Fax:(336) [phone]   Clinic Follow up Note   Patient Care Team: Cletis Athens, MD as PCP - General (Internal Medicine) Manya Silvas, MD (Gastroenterology)  Date of Service:  01/31/2019  CHIEF COMPLAINT: F/u ofadenocarcinoma in liver  SUMMARY OF ONCOLOGIC HISTORY:   Cholangiocarcinoma ([place])   05/05/2018 Initial Biopsy    DIAGNOSIS: 05/05/18 A. LIVER MASS; ULTRASOUND-GUIDED BIOPSY:  - CYTOKERATIN 7 POSITIVE ADENOCARCINOMA WITH ABUNDANT NECROSIS, SEE  COMMENT.  Diagnosis 06/28/18 Consult- Comprehensive, [phone] Liver Bx - POORLY DIFFERENTIATED CARCINOMA INVOLVING LIVER PARENCHYMA. SEE NOTE. Diagnosis Note Most of the tumor shows a abortive glad formation, consistent with an adenocarcinoma. However, in some areas, the tumor cells show increased cytoplasm with a nested architecture, giving them a hepatoid appearance. Immunohistochemical stains performed at the outside institution show that the tumor cells are positive for CK7 and negative for TTF-1, Napsin-A, CDX-2, Hepar-1 and CD34. Immunostains performed at [facility] show that glypican-3 has patchy positive staining in the hepatoid-appearing tumor cells. Arginase and AFP stains show very focal, weak staining. Immunostain of pCEA shows focal canalicular-like staining pattern in the tumor cells. The findings are consistent with a cholangiocarcinoma but the the tumor histomorphology and staining pattern are suggestive of a hepatocellular carcinoma component, raising possibility of a combined hepatocellular cholangiocarcinoma. Additional biopsies or re-evaluation on the resection specimen is suggested for confirmation.    06/02/2018 PET scan    PET 06/02/18  IMPRESSION: 1. Solitary hypermetabolic lesion in the central LEFT hepatic lobe consistent with malignancy. 2. No metabolic activity remaining in LEFT apical pulmonary nodule which is reduced in size. 3. No evidence of  metastatic adenopathy on skull base to thigh FDG PET scan. 4. Cirrhotic liver.  Cholelithiasis 5.  Aortic Atherosclerosis (ICD10-I70.0).    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 Jearld Shines at [facility]  Estimated dose to liver target: 117 Gy Estimated dose to lung: 5.56 Gy Impression: Successful radioembolization with administration of intravascular yttrium-90 Theraspheres as described above.    10/18/2018 Imaging    US Abdomen 10/18/18 IMPRESSION: 1. Cholelithiasis without evidence of acute cholecystitis. 2. 2.2 cm left hepatic lobe mass consistent with known malignancy. 3. Cirrhosis and splenomegaly.    11/09/2018 Imaging    MRI Abdomen 11/09/18 at [facility]  Impression: Marked increased size of the mass centered in segment 4A with new satellite lesions seen in segments 2 and 3, likely representing multifocal mass-forming cholangiocarcinoma. New tumor invasion seen within the right anterior portal vein and middle hepatic vein to the level of the IVC, which is a pattern more commonly seen with hepatocellular carcinoma.    11/25/2018 Miscellaneous    Foundation One 3/21.2020  MSI -Stable - cannot be determined  Tumor Mutation Burden - cannot be determined  TERT promoter 124C>T TP53 [id number]    11/28/2018 Imaging    CT Chest 11/28/18  IMPRESSION: 1. Further improvement in left apical nodule, now nearly resolved. Mild surrounding radiation changes. 2. No new or enlarging pulmonary nodules. 3. Treated lesion in the left hepatic lobe is not optimally visualized on this noncontrast study, although appears slightly larger. 4. Progressive adenopathy in the upper abdomen. 5. Additional incidental findings are stable, including cirrhosis, splenomegaly, aortic valvular calcifications, Aortic Atherosclerosis (ICD10-I70.0) and Emphysema (ICD10-J43.9).    12/25/2018 Cancer Staging    Staging form:  Intrahepatic Bile Duct, AJCC 8th Edition - Clinical stage from 12/25/2018: Stage II (cT2(m),  cN0, cM0) - Signed by [name], MD on 12/27/2018    01/18/2019 -  Chemotherapy    First line Cisplatin and Gemcitabine every 2 weeks starting next week.       CURRENT THERAPY:  Chemo Gemcitabine and Cisplatin every 2 weeks starting 01/24/19  INTERVAL HISTORY:  John [name] is here for a follow up of treatment. He is here alone. He notes he had mild nausea with first week but no need for antiemetics because this resolved on its own. He had constipation before chemo so he takes laxative nightly to control it. He notes he has not received Doptelet yet. He notes last week during the chemo infusion he had trouble breathing during the beginning. He is OK to continue steroids.  He notes he has hemorrhoids which will occasionally bleed. He is looking for suppositories for this.    REVIEW OF SYSTEMS:   Constitutional: Denies fevers, chills or abnormal weight loss Eyes: Denies blurriness of vision Ears, nose, mouth, throat, and face: Denies mucositis or sore throat Respiratory: Denies cough, dyspnea or wheezes Cardiovascular: Denies palpitation, chest discomfort or lower extremity swelling Gastrointestinal:  Denies nausea, heartburn (+) constipation controlled (+) Hemorrhoids with occasional bleeding.  Skin: Denies abnormal skin rashes Lymphatics: Denies new lymphadenopathy or easy bruising Neurological:Denies numbness, tingling or new weaknesses Behavioral/Psych: Mood is stable, no new changes  All other systems were reviewed with the patient and are negative.  MEDICAL HISTORY:  Past Medical History:  Diagnosis Date  . Abnormal MRI, lumbar spine (2015) 01/21/2016   FINDINGS:  L3-4: Tiny right foraminal and extra foraminal disc bulge adjacent to but not compressing the L3 nerve lateral to the neural foramen. L4-5: There is a small broad-based disc bulge. There is also hypertrophy of the ligamentum  flavum and facet joints, left more than right creating moderately severe spinal stenosis and bilateral lateral recess stenosis, left greater than right. This appe  . Anxiety   . Back ache 05/06/2014  . Cervical pain 05/06/2014  . 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[place]) 06/23/2015  . COPD (chronic obstructive pulmonary disease) ([place])   . De Quervain's tenosynovitis, left 07/07/2017  . Depression   . Dystonia   . Epileptic disorder ([place]) 06/23/2015  . Extremity pain 05/06/2014  . Febrile seizures ([place])    child  . Foot pain 09/11/2014  . Gastric ulcer 06/23/2015  . Gout   . H/O neoplasm 06/23/2015  . Head injury 06/23/2015  . Hemorrhoids   . Hepatic cirrhosis ([place]) 06/23/2015  . Hepatitis B   . History of GI bleed   . Leukopenia   . Liver cancer ([place]) 08/2018  . Neurogenic pain 01/21/2016  . Nodule of upper lobe of left lung 12/02/2017  . Non-small cell cancer of left lung ([place]) 2019   Rad tx's.   . Obstructive sleep apnea 06/23/2015  . 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 Osteoarthritis of spine with radiculopathy, lumbosacral region 06/23/2015  . Peripheral neuropathy 06/23/2015  . Peripheral neuropathy ([place]) 29/56/2130  . Personal history of tobacco use, presenting hazards to health 01/09/2016  . Pulse irregularity   . Seizures ([place])   . Spinal stenosis   . Splenomegaly 05/30/2013  . Thrombocytopenia ([place]) 06/23/2015  . Uncomplicated opioid dependence ([place]) 06/23/2015    SURGICAL HISTORY: Past Surgical History:  Procedure Laterality Date  . ESOPHAGOGASTRODUODENOSCOPY  11/2013  . ESOPHAGOGASTRODUODENOSCOPY (EGD) WITH PROPOFOL N/A 06/23/2018   Procedure: ESOPHAGOGASTRODUODENOSCOPY (EGD) WITH PROPOFOL;  Surgeon: Manya Silvas, MD;  Location:  [facility];  Service: Endoscopy;  Laterality: N/A;  . surgery for cervical neck fracture    . TOOTH EXTRACTION Right 09/28/2016    I have reviewed the social history and family history with the  patient and they are unchanged from previous note.  ALLERGIES:  is allergic to codeine; codeine sulfate; morphine; and duloxetine.  MEDICATIONS:  Current Outpatient Medications  Medication Sig Dispense Refill  . ALPRAZolam (XANAX) 0.25 MG tablet Take 0.25 mg by mouth at bedtime as needed for anxiety or sleep.     . Avatrombopag Maleate (DOPTELET) 20 MG TABS Take 20 mg by mouth daily. 30 tablet 0  . baclofen (LIORESAL) 10 MG tablet Take 1 tablet (10 mg total) by mouth 3 (three) times daily. 270 tablet 0  . bisacodyl (DULCOLAX) 5 MG EC tablet Take 1 tablet (5 mg total) by mouth daily as needed for moderate constipation. 90 tablet 0  . cetirizine (ZYRTEC) 10 MG tablet Take 10 mg by mouth daily.  3  . eltrombopag (PROMACTA) 50 MG tablet Take 1 tablet (50 mg total) by mouth daily. Take on an empty stomach 1 hour before a meal or 2 hours after 30 tablet 2  . folic acid (FOLVITE) 1 MG tablet Take 1 mg by mouth daily.     Marland Kitchen gabapentin (NEURONTIN) 600 MG tablet Take 1 tablet (600 mg total) by mouth 3 (three) times daily. 270 tablet 0  . omeprazole (PRILOSEC) 20 MG capsule Take 1 capsule (20 mg total) by mouth daily. 90 capsule 0  . ondansetron (ZOFRAN) 8 MG tablet Take 1 tablet (8 mg total) by mouth every 8 (eight) hours as needed for nausea or vomiting. 20 tablet 0  . [START ON 04/03/2019] oxyCODONE (OXY IR/ROXICODONE) 5 MG immediate release tablet Take 0.5 tablets (2.5 mg total) by mouth 2 (two) times daily for 30 days. 30 tablet 0  . [START ON 03/04/2019] oxyCODONE (OXY IR/ROXICODONE) 5 MG immediate release tablet Take 0.5 tablets (2.5 mg total) by mouth 2 (two) times daily for 30 days. 30 tablet 0  . [START ON 02/02/2019] oxyCODONE (OXY IR/ROXICODONE) 5 MG immediate release tablet Take 0.5 tablets (2.5 mg total) by mouth 2 (two) times daily for 30 days. 30 tablet 0  . prochlorperazine (COMPAZINE) 10 MG tablet Take 1 tablet (10 mg total) by mouth every 6 (six) hours as needed for nausea or vomiting. 30  tablet 0   No current facility-administered medications for this visit.     PHYSICAL EXAMINATION: ECOG PERFORMANCE STATUS: 1 - Symptomatic but completely ambulatory  Vitals:   01/31/19 1356  BP: (!) 120/58  Pulse: 88  Resp: 18  Temp: 98.5 F (36.9 C)  SpO2: 98%   Filed Weights   01/31/19 1356  Weight: 207 lb 3.2 oz (94 kg)    GENERAL:alert, no distress and comfortable SKIN: skin color, texture, turgor are normal, no rashes or significant lesions EYES: normal, Conjunctiva are pink and non-injected, sclera clear  NECK: supple, thyroid normal size, non-tender, without nodularity LYMPH:  no palpable lymphadenopathy in the cervical, axillary  LUNGS: clear to auscultation and percussion with normal breathing effort HEART: regular rate & rhythm and no murmurs and no lower extremity edema ABDOMEN:abdomen soft, non-tender and normal bowel sounds Musculoskeletal:no cyanosis of digits and no clubbing  NEURO: alert & oriented x 3 with fluent speech, no focal motor/sensory deficits  LABORATORY DATA:  I have reviewed the data as listed CBC Latest Ref Rng & Units 01/31/2019 01/24/2019 01/17/2019  WBC 4.0 -  10.5 K/uL 1.8(L) 2.7(L) 2.6(L)  Hemoglobin 13.0 - 17.0 g/dL 11.3(L) 11.6(L) 11.3(L)  Hematocrit 39.0 - 52.0 % 36.9(L) 37.1(L) 37.2(L)  Platelets 150 - 400 K/uL 49(L) 71(L) 62(L)     CMP Latest Ref Rng & Units 01/31/2019 01/24/2019 01/17/2019  Glucose 70 - 99 mg/dL 83 92 127(H)  BUN 8 - 23 mg/dL 10 7(L) 10  Creatinine 0.61 - 1.24 mg/dL 0.85 0.83 0.85  Sodium 135 - 145 mmol/L 138 140 141  Potassium 3.5 - 5.1 mmol/L 4.4 4.1 3.8  Chloride 98 - 111 mmol/L 98 103 101  CO2 22 - 32 mmol/L 33(H) 27 31  Calcium 8.9 - 10.3 mg/dL 9.3 9.1 9.0  Total Protein 6.5 - 8.1 g/dL 8.3(H) 8.0 8.2(H)  Total Bilirubin 0.3 - 1.2 mg/dL 0.5 0.6 0.7  Alkaline Phos 38 - 126 U/L 100 99 97  AST 15 - 41 U/L '28 20 31  ' ALT 0 - 44 U/L '28 14 14      ' RADIOGRAPHIC STUDIES: I have personally reviewed the  radiological images as listed and agreed with the findings in the report. No results found.   ASSESSMENT & PLAN:  KESHON MARKOVITZ is a 63 y.o. 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 His liver biopsy showed poorly differentiated adenocarcinoma, CK7 positive, other markers were negative. Upper endoscopy was negative for malignancy.PET scan otherwise negative for other primary tumor. -His biopsy was evaluated by our pathologistDr. Rogue Jury here, who feels this ismost consistent with cholangiocarcinoma, with possible component of hepatocellular carcinoma. -Cancer type ID test showed 90% chance of pancreaticobiliary primary -Patient did have a 1 cm hypermetabolic lung lesion in early 2019, which was treated with SBRT. No biopsy was obtained. Metastatic lung cancer to liver is also a possibility,although the immunostain was not typical. -He was seen by surgeon Dr. Zenia Resides at [facility] in 07/2018 who did not recommend surgery butreferredhim to IR for target liver therapy. -He underwentY90 radio embolization on12/19/19with Dr. Jearld Shines at [facility].  --Given his liver tumor is non-resectable, it's incurable at this stage but still treatable. I started him on first line chemo cisplatin and gemcitabine on 01/24/19 with dose reduction. Given COVID-19 and his thrombocytopenia, I will start him on chemo every 2 weeks. He tolerated first week well with mild nausea, controlled constipation. -He did have reaction to IV Emend and dexa with SOB, chest pain and flushing, will d/c and will switch dexa to Solu-Medrol on next cycle -Labs reviewed today, CBC, CMP and Mag WNL except WBC 1.8, Hg 11.3, PLT 49K, ANC 1.3, lymphocytes 0.4, Protein 8.3. He will continue oral Magnesium.  -F/u next week    2.Hepatitis B and liver cirrhosis -He doesn't see a hepatologist nor an infectious disease physician,he has never received  hepatitis B treatment. -will repeat Hep BS antigen, Santibody, core antibody, and a viral load -No varices on 06/2018 Endoscopy.  -I encouraged him to return to GI Dr. Tiffany Kocher for management.  3. 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 -PLT at 49K today (01/31/19) -Given he is on chemotherapy, I previously prescribed him Doptelet 51m daily to maintain his PLT. Will start as soon as he receives it.    4. Left apical lungcancer stage IA,s/p SBRT, COPD -PET scanon 11/24/2017 revealed a hypermetabolic 1.0 cm left apical pulmonary nodulefavoring malignancy. Assuming non-small cell lung cancer this represents T1a N0 M0 disease (stage IA).Biopsy was not obtained -Underwent  SBRT 01/05/18-02/01/18 -CT chest from 11/28/18 shows good improvement and no new nodules.   5. Social Support  -He lives by himself.  -He has 2 brothers but 1 does not live very close. -I discussed our resources to help with transportation if needed.   6. Hemorrhoids  -He does have hemorrhoids which are more likely to bleed due to thrombocytopenia. -I recommend he avoid contraption by taking Laxative daily and OTC cream/suppository. If not enough I discussed GI can do banding, he declined for now. I encouraged him to follow up with him.  PLAN: -Labs reviewed, he is clinically doing well.  -Will set up dietician consult  -Lab, f/u and chemo on 6/3   No problem-specific Assessment & Plan notes found for this encounter.   No orders of the defined types were placed in this encounter.  All questions were answered. The patient knows to call the clinic with any problems, questions or concerns. No barriers to learning was detected. I spent 20 minutes counseling the patient face to face. The total time spent in the appointment was 25 minutes and more than 50% was on counseling and review of test results     [name], MD  01/31/2019   I, Joslyn Devon, am acting as scribe for [name], MD.   I have reviewed the above documentation for accuracy and completeness, and I agree with the above.

## 2019-01-26 NOTE — Telephone Encounter (Signed)
Oral Oncology Patient Advocate Encounter  With my supervisors permission, I met the patient at [facility] pharmacy. The patient provided his income and medication price list.  I called [email or web address] and asked if there was an email that I could send the information to since I am working from home today.   I emailed everything they needed to [email or web address] today.  This encounter will be updated until final determination.  John [name] Patient [place] Phone [phone] Fax [phone] 01/26/2019   11:21 AM

## 2019-01-26 NOTE — Telephone Encounter (Signed)
Oral Oncology Patient Advocate Encounter  Received notification from [employer] Patient Assistance program that patient has been successfully enrolled into their program to receive Doptelet from the manufacturer at $0 out of pocket until 09/06/19.    I called and left a voicemail for the patient. He will need to call [phone] to schedule shipment of the medicine.  Patient knows to call the office with questions or concerns.   Oral Oncology Clinic will continue to follow.  Farina Patient [place] Phone [phone] Fax [phone] 01/26/2019    3:52 PM

## 2019-01-30 ENCOUNTER — Other Ambulatory Visit: Payer: Self-pay | Admitting: Physician Assistant

## 2019-01-31 ENCOUNTER — Encounter: Payer: Self-pay | Admitting: Hematology

## 2019-01-31 ENCOUNTER — Inpatient Hospital Stay (HOSPITAL_BASED_OUTPATIENT_CLINIC_OR_DEPARTMENT_OTHER): Payer: Medicare Other | Admitting: Hematology

## 2019-01-31 ENCOUNTER — Inpatient Hospital Stay: Payer: Medicare Other

## 2019-01-31 ENCOUNTER — Other Ambulatory Visit: Payer: Self-pay

## 2019-01-31 ENCOUNTER — Encounter (HOSPITAL_COMMUNITY): Payer: Self-pay | Admitting: Hematology

## 2019-01-31 VITALS — BP 120/58 | HR 88 | Temp 98.5°F | Resp 18 | Ht 72.0 in | Wt 207.2 lb

## 2019-01-31 DIAGNOSIS — C220 Liver cell carcinoma: Secondary | ICD-10-CM

## 2019-01-31 DIAGNOSIS — D6959 Other secondary thrombocytopenia: Secondary | ICD-10-CM

## 2019-01-31 DIAGNOSIS — K7460 Unspecified cirrhosis of liver: Secondary | ICD-10-CM | POA: Diagnosis not present

## 2019-01-31 DIAGNOSIS — Z79899 Other long term (current) drug therapy: Secondary | ICD-10-CM

## 2019-01-31 DIAGNOSIS — B1910 Unspecified viral hepatitis B without hepatic coma: Secondary | ICD-10-CM

## 2019-01-31 DIAGNOSIS — D696 Thrombocytopenia, unspecified: Secondary | ICD-10-CM

## 2019-01-31 DIAGNOSIS — J449 Chronic obstructive pulmonary disease, unspecified: Secondary | ICD-10-CM | POA: Diagnosis not present

## 2019-01-31 DIAGNOSIS — C221 Intrahepatic bile duct carcinoma: Secondary | ICD-10-CM | POA: Diagnosis not present

## 2019-01-31 DIAGNOSIS — K7030 Alcoholic cirrhosis of liver without ascites: Secondary | ICD-10-CM

## 2019-01-31 DIAGNOSIS — C229 Malignant neoplasm of liver, not specified as primary or secondary: Secondary | ICD-10-CM

## 2019-01-31 DIAGNOSIS — Z5111 Encounter for antineoplastic chemotherapy: Secondary | ICD-10-CM | POA: Diagnosis not present

## 2019-01-31 LAB — CMP (CANCER CENTER ONLY)
ALT: 28 U/L (ref 0–44)
AST: 28 U/L (ref 15–41)
Albumin: 4.1 g/dL (ref 3.5–5.0)
Alkaline Phosphatase: 100 U/L (ref 38–126)
Anion gap: 7 (ref 5–15)
BUN: 10 mg/dL (ref 8–23)
CO2: 33 mmol/L — ABNORMAL HIGH (ref 22–32)
Calcium: 9.3 mg/dL (ref 8.9–10.3)
Chloride: 98 mmol/L (ref 98–111)
Creatinine: 0.85 mg/dL (ref 0.61–1.24)
GFR, Est AFR Am: >60 mL/min (ref >60)
GFR, Estimated: >60 mL/min (ref >60)
Glucose, Bld: 83 mg/dL (ref 70–99)
Potassium: 4.4 mmol/L (ref 3.5–5.1)
Sodium: 138 mmol/L (ref 135–145)
Total Bilirubin: 0.5 mg/dL (ref 0.3–1.2)
Total Protein: 8.3 g/dL — ABNORMAL HIGH (ref 6.5–8.1)

## 2019-01-31 LAB — CBC WITH DIFFERENTIAL (CANCER CENTER ONLY)
Abs Immature Granulocytes: 0.01 10*3/uL (ref 0.00–0.07)
Basophils Absolute: 0 10*3/uL (ref 0.0–0.1)
Basophils Relative: 1 %
Eosinophils Absolute: 0 10*3/uL (ref 0.0–0.5)
Eosinophils Relative: 2 %
HCT: 36.9 % — ABNORMAL LOW (ref 39.0–52.0)
Hemoglobin: 11.3 g/dL — ABNORMAL LOW (ref 13.0–17.0)
Immature Granulocytes: 1 %
Lymphocytes Relative: 21 %
Lymphs Abs: 0.4 10*3/uL — ABNORMAL LOW (ref 0.7–4.0)
MCH: 28.4 pg (ref 26.0–34.0)
MCHC: 30.6 g/dL (ref 30.0–36.0)
MCV: 92.7 fL (ref 80.0–100.0)
Monocytes Absolute: 0.1 10*3/uL (ref 0.1–1.0)
Monocytes Relative: 5 %
Neutro Abs: 1.3 10*3/uL — ABNORMAL LOW (ref 1.7–7.7)
Neutrophils Relative %: 70 %
Platelet Count: 49 10*3/uL — ABNORMAL LOW (ref 150–400)
RBC: 3.98 MIL/uL — ABNORMAL LOW (ref 4.22–5.81)
RDW: 15 % (ref 11.5–15.5)
WBC Count: 1.8 10*3/uL — ABNORMAL LOW (ref 4.0–10.5)
nRBC: 0 % (ref 0.0–0.2)

## 2019-01-31 LAB — MAGNESIUM: Magnesium: 2 mg/dL (ref 1.7–2.4)

## 2019-01-31 NOTE — Telephone Encounter (Signed)
Oral Chemotherapy Pharmacist Encounter   I spoke with patient for overview of new oral chemotherapy medication: Doptelet (avatrombopag) for the treatment of chronic liver disease-associated thrombocytopenia, planned durationuntil adequate platelet response or unacceptable toxicity.  We discussed that patient is receiving cisplatin and gemcitabine for the treatment of cholangiocarcinoma. He has pre-existing thrombocytopenia due to his chronic liver disease, and Doptelet is being administered for platelet support to prevent chemotherapy dose reductions or delays secondary to platelet count.  Counseled patient on administration, dosing, side effects, monitoring, drug-food interactions, safe handling, storage, and disposal.  Patient will take Doptelet 20mg  tablets, 1 tablet (20 mg) by mouth once daily, taken with food.  Patient informed that his Doptelet dose may be increased to 2 tablets (40mg ) by mouth once daily with food for a few days prior to chemotherapy administration if 20mg  daily does not adequately support his platelet count.  Doptelet start date: 02/02/19  Side effects include but not limited to: headache, arthralgias, fatigue, bruising, petechia, gingival hemorrhage, epistaxis, upper respiratory tract infection, or fever.   Thrombotic and thromboembolic complications with Doptelet have occurred (<10% incidence).  Patient with pre-existing unilateral thumb pain as well as back pain, managed with oxycodone. Patient instructed to alert the office with increase in pain or increase in oxycodone usage.  Reviewed with patient importance of keeping a medication schedule and plan for any missed doses.  Medication reconciliation performed and medication/allergy list updated.  Insurance authorization for Doptelet has been obtained. Test claim at the pharmacy revealed copayment $1630.69 for 1st fill of Doptelet. This is not affordable to patient. Oral oncology patient advocate was successful  in enrolling patient in compassionate use program with manufacturer, [employer], to receive Doptelet at $0 out-of-pocket cost to patient until 09/06/2019. Patient's first fill of Doptelet shipped from manufacturer on 01/30/2019.  It is coming Tuesday air from [facility]. Patient provided with tracking number that we had received from manufacturer.  Patient instructed to contact manufacturer assistance program at [phone] when he has 10 days of medication remaining to order his next fill of Doptelet.  All questions answered.  John [name] voiced understanding and appreciation.   Patient knows to call the office with questions or concerns.  John [name], PharmD, BCPS, BCOP  01/31/2019  9:31 AM Oral Oncology Clinic [phone]

## 2019-02-01 ENCOUNTER — Telehealth: Payer: Self-pay | Admitting: Hematology

## 2019-02-01 NOTE — Telephone Encounter (Signed)
Scheduled appt per 5/27 los.  Added treatment to the book for approval.  Will contact patient once treatment has been added.

## 2019-02-02 ENCOUNTER — Telehealth: Payer: Self-pay | Admitting: Pharmacist

## 2019-02-02 NOTE — Telephone Encounter (Signed)
Jess, could you let him take some pictures of his rash and show me when he come in next week? Thanks   [name] MD

## 2019-02-02 NOTE — Telephone Encounter (Signed)
Oral Oncology Pharmacist Encounter  Received call from patient this morning with complaints of rash on trunk and upper legs.  Patient took his first dose of Doptelet yesterday, 02/01/2019.  He states he also took some pain medication about 30 minutes after Doptelet administration.  He states 3 hours after that he experienced itchiness that moved from sides to arms, and resolved after a dose of cetirizine. He states this morning he woke up and noticed a Arash of small red dots on his chest, a few on his stomach, some on his sides, and some on his upper legs. Patient states they are not painful or itchy. He denies any rash in his mouth, lower back, or arms.  Patient instructed to hold further Doptelet. I will follow-up with patient on Monday, 02/05/2019, to assess state of rash. If it has resolved, he will continue taking Doptelet and alert the office if rash recurs.  Patient is scheduled for another infusion of cisplatin and gemcitabine on 02/07/2019. Platelet count from 01/31/2019 was reduced to 49k.  Patient states he will not be agreeable to continued chemotherapy administration if his platelets remain in the 40s on lab recheck on 02/07/2019.  MD will be notified.  Johny Drilling, PharmD, BCPS, BCOP  02/02/2019 9:51 AM Oral Oncology Clinic [phone]

## 2019-02-02 NOTE — Telephone Encounter (Signed)
I LVM for patient to take pictures to bring to OV next week. I will keep you posted about follow-up on Monday 02/05/19. Johny Drilling, PharmD, BCPS, BCOP

## 2019-02-05 ENCOUNTER — Telehealth: Payer: Self-pay | Admitting: Hematology

## 2019-02-05 NOTE — Telephone Encounter (Signed)
Called to inform patient that his treatment has been added.  Left a voice message of appt date and time,

## 2019-02-05 NOTE — Telephone Encounter (Signed)
Oral Oncology Pharmacist Encounter  I called patient this morning to follow-up on rash complaints from this past Friday. Patient stated he decided to go on and take his Doptelet on Friday afternoon. Patient stated he has taken his Doptelet everyday without a break. His rash has resolved and not recurred. No other needs identified at this time. Conformed office visits on 02/07/19 with patient.  Johny Drilling, PharmD, BCPS, BCOP  02/05/2019 9:09 AM Oral Oncology Clinic [phone]

## 2019-02-07 ENCOUNTER — Other Ambulatory Visit: Payer: Self-pay

## 2019-02-07 ENCOUNTER — Inpatient Hospital Stay: Payer: Medicare Other

## 2019-02-07 ENCOUNTER — Encounter: Payer: Self-pay | Admitting: Nurse Practitioner

## 2019-02-07 ENCOUNTER — Inpatient Hospital Stay: Payer: Medicare Other | Attending: Hematology

## 2019-02-07 ENCOUNTER — Inpatient Hospital Stay: Payer: Medicare Other | Admitting: Nurse Practitioner

## 2019-02-07 VITALS — BP 148/76 | HR 92 | Temp 97.6°F | Resp 18 | Ht 72.0 in | Wt 207.8 lb

## 2019-02-07 DIAGNOSIS — D696 Thrombocytopenia, unspecified: Secondary | ICD-10-CM

## 2019-02-07 DIAGNOSIS — K649 Unspecified hemorrhoids: Secondary | ICD-10-CM | POA: Insufficient documentation

## 2019-02-07 DIAGNOSIS — D61818 Other pancytopenia: Secondary | ICD-10-CM | POA: Diagnosis not present

## 2019-02-07 DIAGNOSIS — J449 Chronic obstructive pulmonary disease, unspecified: Secondary | ICD-10-CM | POA: Diagnosis not present

## 2019-02-07 DIAGNOSIS — Z5111 Encounter for antineoplastic chemotherapy: Secondary | ICD-10-CM | POA: Diagnosis not present

## 2019-02-07 DIAGNOSIS — C221 Intrahepatic bile duct carcinoma: Secondary | ICD-10-CM | POA: Diagnosis not present

## 2019-02-07 DIAGNOSIS — Z79899 Other long term (current) drug therapy: Secondary | ICD-10-CM

## 2019-02-07 DIAGNOSIS — C229 Malignant neoplasm of liver, not specified as primary or secondary: Secondary | ICD-10-CM

## 2019-02-07 LAB — CBC WITH DIFFERENTIAL (CANCER CENTER ONLY)
Abs Immature Granulocytes: 0 10*3/uL (ref 0.00–0.07)
Basophils Absolute: 0 10*3/uL (ref 0.0–0.1)
Basophils Relative: 1 %
Eosinophils Absolute: 0 10*3/uL (ref 0.0–0.5)
Eosinophils Relative: 3 %
HCT: 36 % — ABNORMAL LOW (ref 39.0–52.0)
Hemoglobin: 11.2 g/dL — ABNORMAL LOW (ref 13.0–17.0)
Immature Granulocytes: 0 %
Lymphocytes Relative: 18 %
Lymphs Abs: 0.3 10*3/uL — ABNORMAL LOW (ref 0.7–4.0)
MCH: 28.8 pg (ref 26.0–34.0)
MCHC: 31.1 g/dL (ref 30.0–36.0)
MCV: 92.5 fL (ref 80.0–100.0)
Monocytes Absolute: 0.2 10*3/uL (ref 0.1–1.0)
Monocytes Relative: 12 %
Neutro Abs: 1.1 10*3/uL — ABNORMAL LOW (ref 1.7–7.7)
Neutrophils Relative %: 66 %
Platelet Count: 44 10*3/uL — ABNORMAL LOW (ref 150–400)
RBC: 3.89 MIL/uL — ABNORMAL LOW (ref 4.22–5.81)
RDW: 16 % — ABNORMAL HIGH (ref 11.5–15.5)
WBC Count: 1.6 10*3/uL — ABNORMAL LOW (ref 4.0–10.5)
nRBC: 0 % (ref 0.0–0.2)

## 2019-02-07 LAB — CMP (CANCER CENTER ONLY)
ALT: 15 U/L (ref 0–44)
AST: 23 U/L (ref 15–41)
Albumin: 3.9 g/dL (ref 3.5–5.0)
Alkaline Phosphatase: 94 U/L (ref 38–126)
Anion gap: 9 (ref 5–15)
BUN: 7 mg/dL — ABNORMAL LOW (ref 8–23)
CO2: 27 mmol/L (ref 22–32)
Calcium: 9 mg/dL (ref 8.9–10.3)
Chloride: 105 mmol/L (ref 98–111)
Creatinine: 0.82 mg/dL (ref 0.61–1.24)
GFR, Est AFR Am: >60 mL/min (ref >60)
GFR, Estimated: >60 mL/min (ref >60)
Glucose, Bld: 86 mg/dL (ref 70–99)
Potassium: 3.8 mmol/L (ref 3.5–5.1)
Sodium: 141 mmol/L (ref 135–145)
Total Bilirubin: 0.5 mg/dL (ref 0.3–1.2)
Total Protein: 7.8 g/dL (ref 6.5–8.1)

## 2019-02-07 LAB — MAGNESIUM: Magnesium: 2 mg/dL (ref 1.7–2.4)

## 2019-02-07 MED ORDER — PALONOSETRON HCL INJECTION 0.25 MG/5ML
INTRAVENOUS | Status: AC
  Filled 2019-02-07: qty 5

## 2019-02-07 MED ORDER — AVATROMBOPAG MALEATE 20 MG PO TABS: 40.0000 mg | ORAL_TABLET | Freq: Every day | ORAL | 0 refills | Status: DC

## 2019-02-07 MED ORDER — AVATROMBOPAG MALEATE 20 MG PO TABS: 40.0000 mg | ORAL_TABLET | Freq: Once | ORAL | 0 refills | Status: DC

## 2019-02-07 MED ORDER — METHYLPREDNISOLONE SODIUM SUCC 125 MG IJ SOLR
INTRAMUSCULAR | Status: AC
  Filled 2019-02-07: qty 2

## 2019-02-07 NOTE — Addendum Note (Signed)
Addended by: Alla Feeling on: 02/07/2019 02:06 PM   Modules accepted: Orders

## 2019-02-07 NOTE — Progress Notes (Signed)
[place]   Telephone:([phone]) [phone] Fax:(336) [phone]   Clinic Follow up Note   Patient Care Team: Cletis Athens, MD as PCP - General (Internal Medicine) Manya Silvas, MD (Gastroenterology) 02/07/2019  CHIEF COMPLAINT: f/u adenocarcinoma in liver    SUMMARY OF ONCOLOGIC HISTORY:   Cholangiocarcinoma ([place])   05/05/2018 Initial Biopsy    DIAGNOSIS: 05/05/18 A. LIVER MASS; ULTRASOUND-GUIDED BIOPSY:  - CYTOKERATIN 7 POSITIVE ADENOCARCINOMA WITH ABUNDANT NECROSIS, SEE  COMMENT.  Diagnosis 06/28/18 Consult- Comprehensive, [phone] Liver Bx - POORLY DIFFERENTIATED CARCINOMA INVOLVING LIVER PARENCHYMA. SEE NOTE. Diagnosis Note Most of the tumor shows a abortive glad formation, consistent with an adenocarcinoma. However, in some areas, the tumor cells show increased cytoplasm with a nested architecture, giving them a hepatoid appearance. Immunohistochemical stains performed at the outside institution show that the tumor cells are positive for CK7 and negative for TTF-1, Napsin-A, CDX-2, Hepar-1 and CD34. Immunostains performed at [facility] show that glypican-3 has patchy positive staining in the hepatoid-appearing tumor cells. Arginase and AFP stains show very focal, weak staining. Immunostain of pCEA shows focal canalicular-like staining pattern in the tumor cells. The findings are consistent with a cholangiocarcinoma but the the tumor histomorphology and staining pattern are suggestive of a hepatocellular carcinoma component, raising possibility of a combined hepatocellular cholangiocarcinoma. Additional biopsies or re-evaluation on the resection specimen is suggested for confirmation.    06/02/2018 PET scan    PET 06/02/18  IMPRESSION: 1. Solitary hypermetabolic lesion in the central LEFT hepatic lobe consistent with malignancy. 2. No metabolic activity remaining in LEFT apical pulmonary nodule which is reduced in size. 3. No evidence of metastatic adenopathy on  skull base to thigh FDG PET scan. 4. Cirrhotic liver.  Cholelithiasis 5.  Aortic Atherosclerosis (ICD10-I70.0).    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 Jearld Shines at [facility]  Estimated dose to liver target: 117 Gy Estimated dose to lung: 5.56 Gy Impression: Successful radioembolization with administration of intravascular yttrium-90 Theraspheres as described above.    10/18/2018 Imaging    US Abdomen 10/18/18 IMPRESSION: 1. Cholelithiasis without evidence of acute cholecystitis. 2. 2.2 cm left hepatic lobe mass consistent with known malignancy. 3. Cirrhosis and splenomegaly.    11/09/2018 Imaging    MRI Abdomen 11/09/18 at [facility]  Impression: Marked increased size of the mass centered in segment 4A with new satellite lesions seen in segments 2 and 3, likely representing multifocal mass-forming cholangiocarcinoma. New tumor invasion seen within the right anterior portal vein and middle hepatic vein to the level of the IVC, which is a pattern more commonly seen with hepatocellular carcinoma.    11/25/2018 Miscellaneous    Foundation One 3/21.2020  MSI -Stable - cannot be determined  Tumor Mutation Burden - cannot be determined  TERT promoter 124C>T [id number]    11/28/2018 Imaging    CT Chest 11/28/18  IMPRESSION: 1. Further improvement in left apical nodule, now nearly resolved. Mild surrounding radiation changes. 2. No new or enlarging pulmonary nodules. 3. Treated lesion in the left hepatic lobe is not optimally visualized on this noncontrast study, although appears slightly larger. 4. Progressive adenopathy in the upper abdomen. 5. Additional incidental findings are stable, including cirrhosis, splenomegaly, aortic valvular calcifications, Aortic Atherosclerosis (ICD10-I70.0) and Emphysema (ICD10-J43.9).    12/25/2018 Cancer Staging    Staging form: Intrahepatic Bile Duct, AJCC  8th Edition - Clinical stage from 12/25/2018: Stage II (cT2(m), cN0, cM0) -  Signed by [name], MD on 12/27/2018    01/18/2019 -  Chemotherapy    First line Cisplatin and Gemcitabine every 2 weeks starting next week.      CURRENT THERAPY: ChemoGemcitabine and Cisplatinevery 2 weeks starting 01/24/19  INTERVAL HISTORY: Mr. Bergdoll returns for f/u and cycle 2 chemo as scheduled. He completed cycle 1 gemcitabine and cisplatin on 01/24/19. He returned for toxicity check at 1 week. Today he feels OK. Appetite is good. Denies mucositis. Fatigue fluctuates and improves with rest, able to complete ADLs. Takes laxative at night for constipation. Denies n/v/d. Denies bleeding in stool. Has intermittent RUQ pain, controlled with oxycodone. Has occasional shortness of breath he was told is related to effect on diaphragm from liver swelling. Denies cough, chest pain. Neuropathy to hands present since 2013 is unchanged on chemo. Takes gabapentin. No functional difficulties. He developed a itching rash on legs, arms, and abdomen the day he began Doptelet, though he questions if it relates to something he ate. Rash resolved spontaneously. Denies fever, chills, leg swelling.      MEDICAL HISTORY:  Past Medical History:  Diagnosis Date  . Abnormal MRI, lumbar spine (2015) 01/21/2016   FINDINGS:  L3-4: Tiny right foraminal and extra foraminal disc bulge adjacent to but not compressing the L3 nerve lateral to the neural foramen. L4-5: There is a small broad-based disc bulge. There is also hypertrophy of the ligamentum flavum and facet joints, left more than right creating moderately severe spinal stenosis and bilateral lateral recess stenosis, left greater than right. This appe  . Anxiety   . Back ache 05/06/2014  . Cervical pain 05/06/2014  . 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[place]) 06/23/2015  . COPD (chronic obstructive pulmonary disease) ([place])   . De Quervain's tenosynovitis, left 07/07/2017  . Depression   .  Dystonia   . Epileptic disorder ([place]) 06/23/2015  . Extremity pain 05/06/2014  . Febrile seizures ([place])    child  . Foot pain 09/11/2014  . Gastric ulcer 06/23/2015  . Gout   . H/O neoplasm 06/23/2015  . Head injury 06/23/2015  . Hemorrhoids   . Hepatic cirrhosis ([place]) 06/23/2015  . Hepatitis B   . History of GI bleed   . Leukopenia   . Liver cancer ([place]) 08/2018  . Neurogenic pain 01/21/2016  . Nodule of upper lobe of left lung 12/02/2017  . Non-small cell cancer of left lung ([place]) 2019   Rad tx's.   . Obstructive sleep apnea 06/23/2015  . 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 Osteoarthritis of spine with radiculopathy, lumbosacral region 06/23/2015  . Peripheral neuropathy 06/23/2015  . Peripheral neuropathy ([place]) 17/51/0258  . Personal history of tobacco use, presenting hazards to health 01/09/2016  . Pulse irregularity   . Seizures ([place])   . Spinal stenosis   . Splenomegaly 05/30/2013  . Thrombocytopenia ([place]) 06/23/2015  . Uncomplicated opioid dependence ([place]) 06/23/2015    SURGICAL HISTORY: Past Surgical History:  Procedure Laterality Date  . ESOPHAGOGASTRODUODENOSCOPY  11/2013  . ESOPHAGOGASTRODUODENOSCOPY (EGD) WITH PROPOFOL N/A 06/23/2018   Procedure: ESOPHAGOGASTRODUODENOSCOPY (EGD) WITH PROPOFOL;  Surgeon: Manya Silvas, MD;  [facility] END[facility]COPY;  Service: Endoscopy;  Laterality: N/A;  . surgery for cervical neck fracture    . TOOTH EXTRACTION Right 09/28/2016    I have reviewed the social history and family history with the patient and they are unchanged from previous note.  ALLERGIES:  is allergic to codeine; codeine sulfate; morphine; and duloxetine.  MEDICATIONS:  Current  Outpatient Medications  Medication Sig Dispense Refill  . ALPRAZolam (XANAX) 0.25 MG tablet Take 0.25 mg by mouth at bedtime as needed for anxiety or sleep.     . Avatrombopag Maleate (DOPTELET) 20 MG TABS Take 20 mg by mouth daily. 30 tablet  0  . baclofen (LIORESAL) 10 MG tablet Take 1 tablet (10 mg total) by mouth 3 (three) times daily. 270 tablet 0  . bisacodyl (DULCOLAX) 5 MG EC tablet Take 1 tablet (5 mg total) by mouth daily as needed for moderate constipation. 90 tablet 0  . cetirizine (ZYRTEC) 10 MG tablet Take 10 mg by mouth daily.  3  . folic acid (FOLVITE) 1 MG tablet Take 1 mg by mouth daily.     Marland Kitchen gabapentin (NEURONTIN) 600 MG tablet Take 1 tablet (600 mg total) by mouth 3 (three) times daily. 270 tablet 0  . omeprazole (PRILOSEC) 20 MG capsule Take 1 capsule (20 mg total) by mouth daily. 90 capsule 0  . ondansetron (ZOFRAN) 8 MG tablet Take 1 tablet (8 mg total) by mouth every 8 (eight) hours as needed for nausea or vomiting. 20 tablet 0  . [START ON 04/03/2019] oxyCODONE (OXY IR/ROXICODONE) 5 MG immediate release tablet Take 0.5 tablets (2.5 mg total) by mouth 2 (two) times daily for 30 days. 30 tablet 0  . [START ON 03/04/2019] oxyCODONE (OXY IR/ROXICODONE) 5 MG immediate release tablet Take 0.5 tablets (2.5 mg total) by mouth 2 (two) times daily for 30 days. 30 tablet 0  . oxyCODONE (OXY IR/ROXICODONE) 5 MG immediate release tablet Take 0.5 tablets (2.5 mg total) by mouth 2 (two) times daily for 30 days. 30 tablet 0  . prochlorperazine (COMPAZINE) 10 MG tablet Take 1 tablet (10 mg total) by mouth every 6 (six) hours as needed for nausea or vomiting. 30 tablet 0   No current facility-administered medications for this visit.     PHYSICAL EXAMINATION: ECOG PERFORMANCE STATUS: 1 - Symptomatic but completely ambulatory  Vitals:   02/07/19 0945  BP: (!) 148/76  Pulse: 92  Resp: 18  Temp: 97.6 F (36.4 C)  SpO2: 97%   Filed Weights   02/07/19 0945  Weight: 207 lb 12.8 oz (94.3 kg)    GENERAL:alert, no distress and comfortable SKIN: no obvious rash  EYES: sclera clear LUNGS: respirations even and unlabored  HEART: no lower extremity edema ABDOMEN: abdomen round, mild tenderness to RUQ palpation   Musculoskeletal:no cyanosis of digits NEURO: alert & oriented x 3 with fluent speech Limited exam for covid19 outbreak   LABORATORY DATA:  I have reviewed the data as listed CBC Latest Ref Rng & Units 02/07/2019 01/31/2019 01/24/2019  WBC 4.0 - 10.5 K/uL 1.6(L) 1.8(L) 2.7(L)  Hemoglobin 13.0 - 17.0 g/dL 11.2(L) 11.3(L) 11.6(L)  Hematocrit 39.0 - 52.0 % 36.0(L) 36.9(L) 37.1(L)  Platelets 150 - 400 K/uL 44(L) 49(L) 71(L)     CMP Latest Ref Rng & Units 02/07/2019 01/31/2019 01/24/2019  Glucose 70 - 99 mg/dL 86 83 92  BUN 8 - 23 mg/dL 7(L) 10 7(L)  Creatinine 0.61 - 1.24 mg/dL 0.82 0.85 0.83  Sodium 135 - 145 mmol/L 141 138 140  Potassium 3.5 - 5.1 mmol/L 3.8 4.4 4.1  Chloride 98 - 111 mmol/L 105 98 103  CO2 22 - 32 mmol/L 27 33(H) 27  Calcium 8.9 - 10.3 mg/dL 9.0 9.3 9.1  Total Protein 6.5 - 8.1 g/dL 7.8 8.3(H) 8.0  Total Bilirubin 0.3 - 1.2 mg/dL 0.5 0.5 0.6  Alkaline Phos 38 -  126 U/L 94 100 99  AST 15 - 41 U/L _0 ALT 0 - 44 U/L _1 RADIOGRAPHIC STUDIES: I have personally reviewed the radiological images as listed and agreed with the findings in the report. No results found.   ASSESSMENT & PLAN: NILO FALLIN is a 63 y.o. 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 His liver biopsy showed poorly differentiated adenocarcinoma, CK7 positive, other markers were negative. Upper endoscopy was negative for malignancy.PET scan otherwise negative for other primary tumor. -His biopsy was evaluated by our pathologistDr. Rogue Jury here, who feels this ismost consistent with cholangiocarcinoma, with possible component of hepatocellular carcinoma. -CancertypeIDtestshowed 92% chance ofpancreaticobiliary primary -Patient did have a 1 cm hypermetabolic lung lesion in early 2019, which was treated with SBRT.No biopsy was obtained.Metastatic lung cancer to liver is also a  possibility,although the immunostain was not typical. -He was seen by surgeon Dr. Zenia Resides at [facility] in 07/2018 who did not recommend surgery butreferredhim to IR for target liver therapy. -He underwentY90 radio embolization on12/19/19with Dr. Jearld Shines at [facility].  -GIven his unresectable disease, Dr. Burr Medico started him on first line chemocisplatin and gemcitabine on 01/24/19 with dose reduction. Given COVID-19 and his thrombocytopenia, he gets chemo every 2 weeks. He tolerated first week well with mild nausea, controlled constipation. -He did have reaction to IV Emend and dexa with SOB, chest pain and flushing; from cycle 2 will get solu-medrol and aloxi   2.Hepatitis B and liver cirrhosis without varices on EGD 06/2018  -per labs 06/2018: hep B surface antibody - reactive; hep B surface antigen - negative; Hep B core antibody - detected  -He did not have treatment for hep B -previously instructed to f/u with GI Dr. Tiffany Kocher   3. 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 -began Doptelet 21m daily to maintain his PLT on 02/01/19.  -instructed to double his dose, total 40 mg, from today for plt 44K -denies bleeding   4. Left apical lungcancer stage IA,s/p SBRT, COPD -PET scanon 11/24/2017 revealed a hypermetabolic 1.0 cm left apical pulmonary nodulefavoring malignancy. Assuming non-small cell lung cancer this represents T1a N0 M0 disease (stage IA).Biopsy was not obtained -Underwent SBRT 01/05/18-02/01/18 -CT chest from 11/28/18 shows good improvement and no new nodules.this was previously reviewed   5. Social Support  -He lives by himself. Has 2 brothers but 1 does not live very close. -has utilized our tBuilding surveyor  6. Hemorrhoids  -denies bleeding -takes laxative daily to prevent constipation  Mr. DMaggioappears stable. He completed cycle 1 cisplatin and gemcitabine. He  experienced mild nausea and constipation. He has recovered well today. His RUQ pain is stable, well controlled. Labs reviewed, CMP is unremarkable. Mg is normal. CBC shows he is pancytopenic. ANC 1.1, PLT 44K, Hgb 11. We reviewed infection and bleeding precautions. Will hold chemotherapy today. I instructed him to increase doptelet to 40 mg daily starting today. Will refill. He will return next week for lab, f/u, and potential chemotherapy if his counts improve. I reviewed with Dr. FBurr Medico Patient understands the plan.    PLAN -Labs reviewed, hold chemotherapy due to thrombocytopenia and neutropenia  -Increase doptelet to 40 mg daily, will refill  -Return in 1 week for lab, f/u, and potential treatment if counts improved   All questions were answered. The patient knows to call the clinic with any problems,  questions or concerns. No barriers to learning was detected.     Alla Feeling, NP 02/07/19

## 2019-02-08 ENCOUNTER — Telehealth: Payer: Self-pay | Admitting: Nurse Practitioner

## 2019-02-08 NOTE — Telephone Encounter (Signed)
Scheduled appt per 6/3 los.  Spoke with patient and he is aware of his appt date and time.

## 2019-02-13 NOTE — Progress Notes (Signed)
[name]   Telephone:(336) [phone] Fax:(336) [phone]   Clinic Follow up Note   Patient Care Team: Cletis Athens, MD as PCP - General (Internal Medicine) Manya Silvas, MD (Gastroenterology) 02/14/2019  CHIEF COMPLAINT: f/u adenocarcinoma in liver   SUMMARY OF ONCOLOGIC HISTORY:   Cholangiocarcinoma ([place])   05/05/2018 Initial Biopsy    DIAGNOSIS: 05/05/18 A. LIVER MASS; ULTRASOUND-GUIDED BIOPSY:  - CYTOKERATIN 7 POSITIVE ADENOCARCINOMA WITH ABUNDANT NECROSIS, SEE  COMMENT.  Diagnosis 06/28/18 Consult- Comprehensive, [id number] Liver Bx - POORLY DIFFERENTIATED CARCINOMA INVOLVING LIVER PARENCHYMA. SEE NOTE. Diagnosis Note Most of the tumor shows a abortive glad formation, consistent with an adenocarcinoma. However, in some areas, the tumor cells show increased cytoplasm with a nested architecture, giving them a hepatoid appearance. Immunohistochemical stains performed at the outside institution show that the tumor cells are positive for CK7 and negative for TTF-1, Napsin-A, CDX-2, Hepar-1 and CD34. Immunostains performed at [facility] show that glypican-3 has patchy positive staining in the hepatoid-appearing tumor cells. Arginase and AFP stains show very focal, weak staining. Immunostain of pCEA shows focal canalicular-like staining pattern in the tumor cells. The findings are consistent with a cholangiocarcinoma but the the tumor histomorphology and staining pattern are suggestive of a hepatocellular carcinoma component, raising possibility of a combined hepatocellular cholangiocarcinoma. Additional biopsies or re-evaluation on the resection specimen is suggested for confirmation.    06/02/2018 PET scan    PET 06/02/18  IMPRESSION: 1. Solitary hypermetabolic lesion in the central LEFT hepatic lobe consistent with malignancy. 2. No metabolic activity remaining in LEFT apical pulmonary nodule which is reduced in size. 3. No evidence of metastatic adenopathy on  skull base to thigh FDG PET scan. 4. Cirrhotic liver.  Cholelithiasis 5.  Aortic Atherosclerosis (ICD10-I70.0).    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 Jearld Shines at [facility]  Estimated dose to liver target: 117 Gy Estimated dose to lung: 5.56 Gy Impression: Successful radioembolization with administration of intravascular yttrium-90 Theraspheres as described above.    10/18/2018 Imaging    US Abdomen 10/18/18 IMPRESSION: 1. Cholelithiasis without evidence of acute cholecystitis. 2. 2.2 cm left hepatic lobe mass consistent with known malignancy. 3. Cirrhosis and splenomegaly.    11/09/2018 Imaging    MRI Abdomen 11/09/18 at [facility]  Impression: Marked increased size of the mass centered in segment 4A with new satellite lesions seen in segments 2 and 3, likely representing multifocal mass-forming cholangiocarcinoma. New tumor invasion seen within the right anterior portal vein and middle hepatic vein to the level of the IVC, which is a pattern more commonly seen with hepatocellular carcinoma.    11/25/2018 Miscellaneous    Foundation One 3/21.2020  MSI -Stable - cannot be determined  Tumor Mutation Burden - cannot be determined  TERT promoter 124C>T TP53 [id number]    11/28/2018 Imaging    CT Chest 11/28/18  IMPRESSION: 1. Further improvement in left apical nodule, now nearly resolved. Mild surrounding radiation changes. 2. No new or enlarging pulmonary nodules. 3. Treated lesion in the left hepatic lobe is not optimally visualized on this noncontrast study, although appears slightly larger. 4. Progressive adenopathy in the upper abdomen. 5. Additional incidental findings are stable, including cirrhosis, splenomegaly, aortic valvular calcifications, Aortic Atherosclerosis (ICD10-I70.0) and Emphysema (ICD10-J43.9).    12/25/2018 Cancer Staging    Staging form: Intrahepatic Bile Duct, AJCC  8th Edition - Clinical stage from 12/25/2018: Stage II (cT2(m), cN0, cM0) - Signed  by [name], MD on 12/27/2018    01/18/2019 -  Chemotherapy    First line Cisplatin and Gemcitabine every 2 weeks starting next week.      CURRENT THERAPY: ChemoGemcitabine and Cisplatinevery 2 weeks starting 01/24/19 S/p cycle 1 on 5/20, cycle 2 delayed for neutropenia and thrombocytopenia   INTERVAL HISTORY: Mr. Jeffries returns for follow up and potential chemo. S/p cycle 1 on 5/20. He was seen in f/u on 6/3 for cycle 2 which was delayed for neutropenia ANC 1.1 and thrombocytopenia plt 44K. He increased doptelet to 40 mg daily. He attributes increased constipation to this. No BM in 2 days then went after increasing laxative. No blood in stool. Denies n/v. Had 1 mouth sore that resolved with salt water rinse. Eating and drinking well. He has intermittent left upper chest/shoulder pain he thinks is muscular. He has been lifting and used tiller recently. Hands tingle occasionally, related to pinched nerve, especially when they "fall asleep" at night and while driving. No other neuropathy. Denies fever, chills, cough, chest pain, leg edema.    MEDICAL HISTORY:  Past Medical History:  Diagnosis Date  . Abnormal MRI, lumbar spine (2015) 01/21/2016   FINDINGS:  L3-4: Tiny right foraminal and extra foraminal disc bulge adjacent to but not compressing the L3 nerve lateral to the neural foramen. L4-5: There is a small broad-based disc bulge. There is also hypertrophy of the ligamentum flavum and facet joints, left more than right creating moderately severe spinal stenosis and bilateral lateral recess stenosis, left greater than right. This appe  . Anxiety   . Back ache 05/06/2014  . Cervical pain 05/06/2014  . 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[place]) 06/23/2015  . COPD (chronic obstructive pulmonary disease) ([place])   . De Quervain's tenosynovitis, left 07/07/2017  . Depression   . Dystonia   . Epileptic disorder ([place])  06/23/2015  . Extremity pain 05/06/2014  . Febrile seizures ([place])    child  . Foot pain 09/11/2014  . Gastric ulcer 06/23/2015  . Gout   . H/O neoplasm 06/23/2015  . Head injury 06/23/2015  . Hemorrhoids   . Hepatic cirrhosis ([place]) 06/23/2015  . Hepatitis B   . History of GI bleed   . Leukopenia   . Liver cancer ([place]) 08/2018  . Neurogenic pain 01/21/2016  . Nodule of upper lobe of left lung 12/02/2017  . Non-small cell cancer of left lung ([place]) 2019   Rad tx's.   . Obstructive sleep apnea 06/23/2015  . 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 Osteoarthritis of spine with radiculopathy, lumbosacral region 06/23/2015  . Peripheral neuropathy 06/23/2015  . Peripheral neuropathy ([place]) 29/47/6546  . Personal history of tobacco use, presenting hazards to health 01/09/2016  . Pulse irregularity   . Seizures ([place])   . Spinal stenosis   . Splenomegaly 05/30/2013  . Thrombocytopenia ([place]) 06/23/2015  . Uncomplicated opioid dependence ([place]) 06/23/2015    SURGICAL HISTORY: Past Surgical History:  Procedure Laterality Date  . ESOPHAGOGASTRODUODENOSCOPY  11/2013  . ESOPHAGOGASTRODUODENOSCOPY (EGD) WITH PROPOFOL N/A 06/23/2018   Procedure: ESOPHAGOGASTRODUODENOSCOPY (EGD) WITH PROPOFOL;  Surgeon: Manya Silvas, MD;  Location: [facility] ENDOSCOPY;  Service: Endoscopy;  Laterality: N/A;  . surgery for cervical neck fracture    . TOOTH EXTRACTION Right 09/28/2016    I have reviewed the social history and family history with the patient and they are unchanged from previous note.  ALLERGIES:  is allergic to codeine; codeine sulfate; morphine; and duloxetine.  MEDICATIONS:  Current  Outpatient Medications  Medication Sig Dispense Refill  . ALPRAZolam (XANAX) 0.25 MG tablet Take 0.25 mg by mouth at bedtime as needed for anxiety or sleep.     . Avatrombopag Maleate 20 MG TABS Take 40 mg by mouth daily. Take 2 tabs daily 60 tablet 0  . baclofen (LIORESAL) 10 MG  tablet Take 1 tablet (10 mg total) by mouth 3 (three) times daily. 270 tablet 0  . bisacodyl (DULCOLAX) 5 MG EC tablet Take 1 tablet (5 mg total) by mouth daily as needed for moderate constipation. 90 tablet 0  . cetirizine (ZYRTEC) 10 MG tablet Take 10 mg by mouth daily.  3  . folic acid (FOLVITE) 1 MG tablet Take 1 mg by mouth daily.     Marland Kitchen gabapentin (NEURONTIN) 600 MG tablet Take 1 tablet (600 mg total) by mouth 3 (three) times daily. 270 tablet 0  . omeprazole (PRILOSEC) 20 MG capsule Take 1 capsule (20 mg total) by mouth daily. 90 capsule 0  . ondansetron (ZOFRAN) 8 MG tablet Take 1 tablet (8 mg total) by mouth every 8 (eight) hours as needed for nausea or vomiting. 20 tablet 0  . [START ON 04/03/2019] oxyCODONE (OXY IR/ROXICODONE) 5 MG immediate release tablet Take 0.5 tablets (2.5 mg total) by mouth 2 (two) times daily for 30 days. 30 tablet 0  . [START ON 03/04/2019] oxyCODONE (OXY IR/ROXICODONE) 5 MG immediate release tablet Take 0.5 tablets (2.5 mg total) by mouth 2 (two) times daily for 30 days. 30 tablet 0  . oxyCODONE (OXY IR/ROXICODONE) 5 MG immediate release tablet Take 0.5 tablets (2.5 mg total) by mouth 2 (two) times daily for 30 days. 30 tablet 0  . prochlorperazine (COMPAZINE) 10 MG tablet Take 1 tablet (10 mg total) by mouth every 6 (six) hours as needed for nausea or vomiting. 30 tablet 0   No current facility-administered medications for this visit.    Facility-Administered Medications Ordered in Other Visits  Medication Dose Route Frequency Provider Last Rate Last Dose  . heparin lock flush 100 unit/mL  500 Units Intracatheter Once PRN [name], MD      . sodium chloride flush (NS) 0.9 % injection 10 mL  10 mL Intracatheter PRN [name], MD        PHYSICAL EXAMINATION: ECOG PERFORMANCE STATUS: 1 - Symptomatic but completely ambulatory  Vitals:   02/14/19 0950  BP: 124/79  Pulse: 86  Resp: 18  Temp: 98.9 F (37.2 C)  SpO2: 95%   Filed Weights   02/14/19 0950   Weight: 208 lb 4.8 oz (94.5 kg)    GENERAL:alert, no distress and comfortable SKIN: no obvious rash  EYES:  sclera clear LUNGS: respirations even and unlabored  HEART:  no lower extremity edema ABDOMEN:abdomen round Musculoskeletal:no cyanosis of digits  NEURO: alert & oriented x 3 with fluent speech, normal gait. No vibratory deficit per tuning fork exam Limited exam for covid19 outbreak   LABORATORY DATA:  I have reviewed the data as listed CBC Latest Ref Rng & Units 02/14/2019 02/07/2019 01/31/2019  WBC 4.0 - 10.5 K/uL 2.0(L) 1.6(L) 1.8(L)  Hemoglobin 13.0 - 17.0 g/dL 10.8(L) 11.2(L) 11.3(L)  Hematocrit 39.0 - 52.0 % 35.4(L) 36.0(L) 36.9(L)  Platelets 150 - 400 K/uL 211 44(L) 49(L)     CMP Latest Ref Rng & Units 02/14/2019 02/07/2019 01/31/2019  Glucose 70 - 99 mg/dL 113(H) 86 83  BUN 8 - 23 mg/dL 7(L) 7(L) 10  Creatinine 0.61 - 1.24 mg/dL 0.84 0.82 0.85  Sodium 135 - 145 mmol/L 140 141 138  Potassium 3.5 - 5.1 mmol/L 4.0 3.8 4.4  Chloride 98 - 111 mmol/L 104 105 98  CO2 22 - 32 mmol/L 26 27 33(H)  Calcium 8.9 - 10.3 mg/dL 9.0 9.0 9.3  Total Protein 6.5 - 8.1 g/dL 7.7 7.8 8.3(H)  Total Bilirubin 0.3 - 1.2 mg/dL 0.6 0.5 0.5  Alkaline Phos 38 - 126 U/L 86 94 100  AST 15 - 41 U/L '24 23 28  ' ALT 0 - 44 U/L '15 15 28      ' RADIOGRAPHIC STUDIES: I have personally reviewed the radiological images as listed and agreed with the findings in the report. No results found.   ASSESSMENT & PLAN: Kaylob Wallen Dodsonis a 63 y.o.malewith   1.Poorly differentiated adenocarcinoma in liver,probablecholangiocarcinoma, unresectable -He was diagnosed in 04/2018. His liver biopsy showed poorly differentiated adenocarcinoma, CK7 positive, other markers were negative. Upper endoscopy was negative for malignancy.PET scan otherwise negative for other primary tumor. -His biopsy was evaluated by our pathologistDr. Vic Ripper here, who feels this ismost consistent with cholangiocarcinoma, with  possible component of hepatocellular carcinoma. -CancertypeIDtestshowed 94% chance ofpancreaticobiliary primary -Patient did have a 1 cm hypermetabolic lung lesion in early 2019, which was treated with SBRT.No biopsy was obtained.Metastatic lung cancer to liver is also a possibility,although the immunostain was not typical. -He was seen by surgeon Dr. Zenia Resides at [facility] in 07/2018 who did not recommend surgery butreferredhim to IR for target liver therapy. -He underwentY90 radio embolization on12/19/19with Dr. Jearld [facility].  -GIven his unresectable disease, Dr. Burr Medico started him onfirst linechemocisplatin and gemcitabineon 5/20/20with dose reduction.Given COVID-19and his thrombocytopenia,he gets chemo every 2 weeks.He tolerated first week well with mild nausea, controlled constipation. -He did have reaction to IV Emendand dexawith SOB, chest pain and flushing; from cycle 2 will get solu-medrol and aloxi  -Cycle 2 delayed for neutropenia ANC 1.1 and thrombocytopenia PLT 44K. Instructed to increase doptelet to 40 mg daily   2.Hepatitis B and liver cirrhosis without varices on EGD 06/2018  -per labs 06/2018: hep B surface antibody - reactive; hep B surface antigen - negative; Hep B core antibody - detected  -He did not have treatment for hep B -previously instructed to f/u with GI Dr. Tiffany Kocher   3. 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 -began Doptelet 45m daily to maintain his PLT on 02/01/19.  -instructed to double his dose, total 40 mg, from 02/07/19 - platelet count responded well (211K on 6/10) -denies bleeding   4. Left apical lungcancer stage IA,s/p SBRT, COPD -PET scanon 11/24/2017 revealed a hypermetabolic 1.0 cm left apical pulmonary nodulefavoring malignancy. Assuming non-small cell lung cancer this represents T1a N0 M0 disease (stage IA).Biopsy was not obtained  -Underwent SBRT 01/05/18-02/01/18 -CT chest from 11/28/18 shows good improvement and no new nodules.this was previously reviewed   5. Social Support  -He lives by himself. Has 2 brothers but 1 does not live very close. -has utilized our tBuilding surveyor  6. Hemorrhoids  -denies bleeding -takes laxative 1-2x daily for constipation  Mr. DJafriappears well today. Labs reviewed; CMP stable. ANC improved to 1.3 which is close to his baseline. He increased doptelet to 40 mg daily on 02/07/19, PLT normalized to 211K; he responded well. He will continue this dose. My nurse resent refill today.   I reviewed his treatment plan with Dr. FBurr Medicowho will increase gemcitabine to 800 mg/m2. Proceed with cisplatin and gemcitabine  today. I reviewed potential increased toxicities with higher chemo dose. If his counts drop again, he may require granix in the future. I reviewed potential side effects including bone pain and possible splenic rupture, for which he is at increased risk d/t splenomegaly. He is reluctant, but will consider. I will get insurance approval.   I reviewed infection and bleeding precautions, he knows to call clinic with new or worsening concerns. He will return in 2 weeks for follow up and cycle 3.  PLAN: -Labs reviewed, proceed with gemcitabine 800 mg/m2 and cisplatin 25 mg/m2 today - patient aware of dose increase  -Continue doptelet 40 mg daily, refill sent -Insurance approval for granix for potential use in the future  -F/u in 2 weeks with cycle 3  All questions were answered. The patient knows to call the clinic with any problems, questions or concerns. No barriers to learning was detected.     Alla Feeling, NP 02/14/19

## 2019-02-14 ENCOUNTER — Telehealth: Payer: Self-pay | Admitting: Nurse Practitioner

## 2019-02-14 ENCOUNTER — Inpatient Hospital Stay (HOSPITAL_BASED_OUTPATIENT_CLINIC_OR_DEPARTMENT_OTHER): Payer: Medicare Other | Admitting: Nurse Practitioner

## 2019-02-14 ENCOUNTER — Other Ambulatory Visit: Payer: Self-pay

## 2019-02-14 ENCOUNTER — Encounter: Payer: Self-pay | Admitting: Nurse Practitioner

## 2019-02-14 ENCOUNTER — Inpatient Hospital Stay: Payer: Medicare Other

## 2019-02-14 VITALS — BP 124/79 | HR 86 | Temp 98.9°F | Resp 18 | Ht 72.0 in | Wt 208.3 lb

## 2019-02-14 DIAGNOSIS — Z79899 Other long term (current) drug therapy: Secondary | ICD-10-CM

## 2019-02-14 DIAGNOSIS — J449 Chronic obstructive pulmonary disease, unspecified: Secondary | ICD-10-CM | POA: Diagnosis not present

## 2019-02-14 DIAGNOSIS — C221 Intrahepatic bile duct carcinoma: Secondary | ICD-10-CM | POA: Diagnosis not present

## 2019-02-14 DIAGNOSIS — D61818 Other pancytopenia: Secondary | ICD-10-CM | POA: Diagnosis not present

## 2019-02-14 DIAGNOSIS — K649 Unspecified hemorrhoids: Secondary | ICD-10-CM | POA: Diagnosis not present

## 2019-02-14 DIAGNOSIS — C229 Malignant neoplasm of liver, not specified as primary or secondary: Secondary | ICD-10-CM

## 2019-02-14 DIAGNOSIS — Z5111 Encounter for antineoplastic chemotherapy: Secondary | ICD-10-CM | POA: Diagnosis not present

## 2019-02-14 LAB — CBC WITH DIFFERENTIAL (CANCER CENTER ONLY)
Abs Immature Granulocytes: 0 10*3/uL (ref 0.00–0.07)
Basophils Absolute: 0 10*3/uL (ref 0.0–0.1)
Basophils Relative: 1 %
Eosinophils Absolute: 0.1 10*3/uL (ref 0.0–0.5)
Eosinophils Relative: 4 %
HCT: 35.4 % — ABNORMAL LOW (ref 39.0–52.0)
Hemoglobin: 10.8 g/dL — ABNORMAL LOW (ref 13.0–17.0)
Immature Granulocytes: 0 %
Lymphocytes Relative: 18 %
Lymphs Abs: 0.4 10*3/uL — ABNORMAL LOW (ref 0.7–4.0)
MCH: 28.9 pg (ref 26.0–34.0)
MCHC: 30.5 g/dL (ref 30.0–36.0)
MCV: 94.7 fL (ref 80.0–100.0)
Monocytes Absolute: 0.3 10*3/uL (ref 0.1–1.0)
Monocytes Relative: 15 %
Neutro Abs: 1.3 10*3/uL — ABNORMAL LOW (ref 1.7–7.7)
Neutrophils Relative %: 62 %
Platelet Count: 211 10*3/uL (ref 150–400)
RBC: 3.74 MIL/uL — ABNORMAL LOW (ref 4.22–5.81)
RDW: 17.3 % — ABNORMAL HIGH (ref 11.5–15.5)
WBC Count: 2 10*3/uL — ABNORMAL LOW (ref 4.0–10.5)
nRBC: 0 % (ref 0.0–0.2)

## 2019-02-14 LAB — CMP (CANCER CENTER ONLY)
ALT: 15 U/L (ref 0–44)
AST: 24 U/L (ref 15–41)
Albumin: 3.8 g/dL (ref 3.5–5.0)
Alkaline Phosphatase: 86 U/L (ref 38–126)
Anion gap: 10 (ref 5–15)
BUN: 7 mg/dL — ABNORMAL LOW (ref 8–23)
CO2: 26 mmol/L (ref 22–32)
Calcium: 9 mg/dL (ref 8.9–10.3)
Chloride: 104 mmol/L (ref 98–111)
Creatinine: 0.84 mg/dL (ref 0.61–1.24)
GFR, Est AFR Am: >60 mL/min (ref >60)
GFR, Estimated: >60 mL/min (ref >60)
Glucose, Bld: 113 mg/dL — ABNORMAL HIGH (ref 70–99)
Potassium: 4 mmol/L (ref 3.5–5.1)
Sodium: 140 mmol/L (ref 135–145)
Total Bilirubin: 0.6 mg/dL (ref 0.3–1.2)
Total Protein: 7.7 g/dL (ref 6.5–8.1)

## 2019-02-14 LAB — MAGNESIUM: Magnesium: 2.1 mg/dL (ref 1.7–2.4)

## 2019-02-14 MED ORDER — SODIUM CHLORIDE 0.9 % IV SOLN
Freq: Once | INTRAVENOUS | Status: AC
  Administered 2019-02-14: 11:00:00 via INTRAVENOUS
  Filled 2019-02-14: qty 250

## 2019-02-14 MED ORDER — METHYLPREDNISOLONE SODIUM SUCC 40 MG IJ SOLR
INTRAMUSCULAR | Status: AC
  Filled 2019-02-14: qty 1

## 2019-02-14 MED ORDER — PALONOSETRON HCL INJECTION 0.25 MG/5ML
0.2500 mg | Freq: Once | INTRAVENOUS | Status: AC
  Administered 2019-02-14: 0.25 mg via INTRAVENOUS

## 2019-02-14 MED ORDER — POTASSIUM CHLORIDE 2 MEQ/ML IV SOLN
Freq: Once | INTRAVENOUS | Status: AC
  Administered 2019-02-14: 11:00:00 via INTRAVENOUS
  Filled 2019-02-14: qty 10

## 2019-02-14 MED ORDER — SODIUM CHLORIDE 0.9 % IV SOLN
800.0000 mg/m2 | Freq: Once | INTRAVENOUS | Status: AC
  Administered 2019-02-14: 1748 mg via INTRAVENOUS
  Filled 2019-02-14: qty 45.97

## 2019-02-14 MED ORDER — PALONOSETRON HCL INJECTION 0.25 MG/5ML
INTRAVENOUS | Status: AC
  Filled 2019-02-14: qty 5

## 2019-02-14 MED ORDER — METHYLPREDNISOLONE SODIUM SUCC 40 MG IJ SOLR
60.0000 mg | Freq: Once | INTRAMUSCULAR | Status: AC
  Administered 2019-02-14: 60 mg via INTRAVENOUS

## 2019-02-14 MED ORDER — SODIUM CHLORIDE 0.9% FLUSH
10.0000 mL | INTRAVENOUS | Status: DC | PRN
  Filled 2019-02-14: qty 10

## 2019-02-14 MED ORDER — SODIUM CHLORIDE 0.9 % IV SOLN
25.0000 mg/m2 | Freq: Once | INTRAVENOUS | Status: AC
  Administered 2019-02-14: 54 mg via INTRAVENOUS
  Filled 2019-02-14: qty 54

## 2019-02-14 MED ORDER — HEPARIN SOD (PORK) LOCK FLUSH 100 UNIT/ML IV SOLN
500.0000 [IU] | Freq: Once | INTRAVENOUS | Status: DC | PRN
  Filled 2019-02-14: qty 5

## 2019-02-14 MED ORDER — SODIUM CHLORIDE 0.9 % IV SOLN
Freq: Once | INTRAVENOUS | Status: AC
  Administered 2019-02-14: 14:00:00 via INTRAVENOUS
  Filled 2019-02-14: qty 250

## 2019-02-14 NOTE — Addendum Note (Signed)
Addended by: Alla Feeling on: 02/14/2019 01:46 PM   Modules accepted: Orders

## 2019-02-14 NOTE — Patient Instructions (Signed)
[facility] Discharge Instructions for Patients Receiving Chemotherapy  Today you received the following chemotherapy agents: Gemzar and Cisplatin.  To help prevent nausea and vomiting after your treatment, we encourage you to take your nausea medication as prescribed.   If you develop nausea and vomiting that is not controlled by your nausea medication, call the clinic.   BELOW ARE SYMPTOMS THAT SHOULD BE REPORTED IMMEDIATELY:  *FEVER GREATER THAN 100.5 F  *CHILLS WITH OR WITHOUT FEVER  NAUSEA AND VOMITING THAT IS NOT CONTROLLED WITH YOUR NAUSEA MEDICATION  *UNUSUAL SHORTNESS OF BREATH  *UNUSUAL BRUISING OR BLEEDING  TENDERNESS IN MOUTH AND THROAT WITH OR WITHOUT PRESENCE OF ULCERS  *URINARY PROBLEMS  *BOWEL PROBLEMS  UNUSUAL RASH Items with * indicate a potential emergency and should be followed up as soon as possible.  Feel free to call the clinic should you have any questions or concerns. The clinic phone number is [phone].  Please show the [place] at check-in to the Emergency Department and triage nurse.

## 2019-02-14 NOTE — Telephone Encounter (Signed)
Scheduled appt per 6/10 los.

## 2019-02-14 NOTE — Progress Notes (Signed)
Cira Rue, NP ok to treat with ANC 1.3.

## 2019-02-19 ENCOUNTER — Telehealth: Payer: Self-pay

## 2019-02-19 NOTE — Telephone Encounter (Signed)
Left message for the patient after consulting with Dr. Burr Medico regarding his neck pain.  Per Dr. Burr Medico patient can take Tylenol and apply heat to the neck.  If he feels it is not much better she is willing to send in a muscle relaxer (Flexeril).  I asked that he call back and let me know if he would like that sent into his pharmacy.

## 2019-02-19 NOTE — Telephone Encounter (Signed)
Left voice message for patient as a follow up from the after hours message center regarding onset of neck pain over the weekend.  I asked the patient to call us back to let us know how he is doing.

## 2019-02-21 ENCOUNTER — Ambulatory Visit: Payer: Medicare Other | Admitting: Nurse Practitioner

## 2019-02-21 ENCOUNTER — Ambulatory Visit: Payer: Medicare Other

## 2019-02-21 ENCOUNTER — Other Ambulatory Visit: Payer: Medicare Other

## 2019-02-23 NOTE — Progress Notes (Signed)
[place]   [phone] Fax:([phone]   Clinic Follow up Note   Patient Care Team: Cletis Athens, MD as PCP - General (Internal Medicine) Manya Silvas, MD (Gastroenterology)  Date of Service:  03/01/2019  CHIEF COMPLAINT: F/u ofadenocarcinoma in liver   SUMMARY OF ONCOLOGIC HISTORY: Oncology History  Cholangiocarcinoma ([place])  05/05/2018 Initial Biopsy   DIAGNOSIS: 05/05/18 A. LIVER MASS; ULTRASOUND-GUIDED BIOPSY:  - CYTOKERATIN 7 POSITIVE ADENOCARCINOMA WITH ABUNDANT NECROSIS, SEE  COMMENT.  Diagnosis 06/28/18 Consult- Comprehensive, [phone] Liver Bx - POORLY DIFFERENTIATED CARCINOMA INVOLVING LIVER PARENCHYMA. SEE NOTE. Diagnosis Note Most of the tumor shows a abortive glad formation, consistent with an adenocarcinoma. However, in some areas, the tumor cells show increased cytoplasm with a nested architecture, giving them a hepatoid appearance. Immunohistochemical stains performed at the outside institution show that the tumor cells are positive for CK7 and negative for TTF-1, Napsin-A, CDX-2, Hepar-1 and CD34. Immunostains performed at [facility] show that glypican-3 has patchy positive staining in the hepatoid-appearing tumor cells. Arginase and AFP stains show very focal, weak staining. Immunostain of pCEA shows focal canalicular-like staining pattern in the tumor cells. The findings are consistent with a cholangiocarcinoma but the the tumor histomorphology and staining pattern are suggestive of a hepatocellular carcinoma component, raising possibility of a combined hepatocellular cholangiocarcinoma. Additional biopsies or re-evaluation on the resection specimen is suggested for confirmation.   06/02/2018 PET scan   PET 06/02/18  IMPRESSION: 1. Solitary hypermetabolic lesion in the central LEFT hepatic lobe consistent with malignancy. 2. No metabolic activity remaining in LEFT apical pulmonary nodule which is reduced in size. 3. No  evidence of metastatic adenopathy on skull base to thigh FDG PET scan. 4. Cirrhotic liver.  Cholelithiasis 5.  Aortic Atherosclerosis (ICD10-I70.0).   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 Jearld Shines at [facility]  Estimated dose to liver target: 117 Gy Estimated dose to lung: 5.56 Gy Impression: Successful radioembolization with administration of intravascular yttrium-90 Theraspheres as described above.   10/18/2018 Imaging   US Abdomen 10/18/18 IMPRESSION: 1. Cholelithiasis without evidence of acute cholecystitis. 2. 2.2 cm left hepatic lobe mass consistent with known malignancy. 3. Cirrhosis and splenomegaly.   11/09/2018 Imaging   MRI Abdomen 11/09/18 at [facility]  Impression: Marked increased size of the mass centered in segment 4A with new satellite lesions seen in segments 2 and 3, likely representing multifocal mass-forming cholangiocarcinoma. New tumor invasion seen within the right anterior portal vein and middle hepatic vein to the level of the IVC, which is a pattern more commonly seen with hepatocellular carcinoma.   11/25/2018 Miscellaneous   Foundation One 3/21.2020  MSI -Stable - cannot be determined  Tumor Mutation Burden - cannot be determined  TERT promoter 124C>T TP53 [id number]   11/28/2018 Imaging   CT Chest 11/28/18  IMPRESSION: 1. Further improvement in left apical nodule, now nearly resolved. Mild surrounding radiation changes. 2. No new or enlarging pulmonary nodules. 3. Treated lesion in the left hepatic lobe is not optimally visualized on this noncontrast study, although appears slightly larger. 4. Progressive adenopathy in the upper abdomen. 5. Additional incidental findings are stable, including cirrhosis, splenomegaly, aortic valvular calcifications, Aortic Atherosclerosis (ICD10-I70.0) and Emphysema (ICD10-J43.9).   12/25/2018 Cancer Staging   Staging form:  Intrahepatic Bile Duct, AJCC 8th Edition - Clinical stage from 12/25/2018: Stage II (cT2(m), cN0, cM0) - Signed by [name], MD on 12/27/2018   01/24/2019 -  Chemotherapy  First line Cisplatin and Gemcitabine every 2 weeks starting 01/24/19  -He did have reaction to IV Emend and dexa with SOB, chest pain and flushing, will d/c and will switch dexa to Solu-Medrol on cycle 2.       CURRENT THERAPY:  ChemoGemcitabine and Cisplatinevery 2 weeks starting 01/24/19  INTERVAL HISTORY:  John [name] is here for a follow up and treatment. He presents to the clinic alone. He notes he had fatigue with last cycle chemo that lasted for 2 days. This did improve. He was able to manage his appetite and eating. He overall was able to tolerate well. He notes abdominal bloating is mostly stable. His abdominal pain is intermittent. He notes the last 2 days he was taking care of his mother with recurrent falls and has not been eating much. He notes she is back home after being discharged last night. She lives by herself as her husband passed last 06/2018.  He notes his pain specialist NP Edison Pace has left the practice and plans to be seen by [place]. I reviewed his medication list with him.     REVIEW OF SYSTEMS:   Constitutional: Denies fevers, chills (+) Mild weight loss due to stress  Eyes: Denies blurriness of vision Ears, nose, mouth, throat, and face: Denies mucositis or sore throat Respiratory: Denies cough, dyspnea or wheezes Cardiovascular: Denies palpitation, chest discomfort or lower extremity swelling Gastrointestinal:  Denies nausea, heartburn or change in bowel habits (+) abdominal bloating (+) intermittent abdominal pain Skin: Denies abnormal skin rashes Lymphatics: Denies new lymphadenopathy or easy bruising Neurological:Denies numbness, tingling or new weaknesses Behavioral/Psych: Mood is stable, no new changes  All other systems were reviewed with the patient and are negative.  MEDICAL  HISTORY:  Past Medical History:  Diagnosis Date  . Abnormal MRI, lumbar spine (2015) 01/21/2016   FINDINGS:  L3-4: Tiny right foraminal and extra foraminal disc bulge adjacent to but not compressing the L3 nerve lateral to the neural foramen. L4-5: There is a small broad-based disc bulge. There is also hypertrophy of the ligamentum flavum and facet joints, left more than right creating moderately severe spinal stenosis and bilateral lateral recess stenosis, left greater than right. This appe  . Anxiety   . Back ache 05/06/2014  . Cervical pain 05/06/2014  . 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[place]) 06/23/2015  . COPD (chronic obstructive pulmonary disease) ([place])   . De Quervain's tenosynovitis, left 07/07/2017  . Depression   . Dystonia   . Epileptic disorder ([place]) 06/23/2015  . Extremity pain 05/06/2014  . Febrile seizures ([place])    child  . Foot pain 09/11/2014  . Gastric ulcer 06/23/2015  . Gout   . H/O neoplasm 06/23/2015  . Head injury 06/23/2015  . Hemorrhoids   . Hepatic cirrhosis ([place]) 06/23/2015  . Hepatitis B   . History of GI bleed   . Leukopenia   . Liver cancer ([place]) 08/2018  . Neurogenic pain 01/21/2016  . Nodule of upper lobe of left lung 12/02/2017  . Non-small cell cancer of left lung ([place]) 2019   Rad tx's.   . Obstructive sleep apnea 06/23/2015  . 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 Osteoarthritis of spine with radiculopathy, lumbosacral region 06/23/2015  . Peripheral neuropathy 06/23/2015  . Peripheral neuropathy ([place]) 90/30/0923  . Personal history of tobacco use, presenting hazards to health 01/09/2016  . Pulse irregularity   . Seizures ([place])   . Spinal stenosis   . Splenomegaly 05/30/2013  . Thrombocytopenia (  [place]) 06/23/2015  . Uncomplicated opioid dependence ([place]) 06/23/2015    SURGICAL HISTORY: Past Surgical History:  Procedure Laterality Date  . ESOPHAGOGASTRODUODENOSCOPY  11/2013  . ESOPHAGOGASTRODUODENOSCOPY (EGD)  WITH PROPOFOL N/A 06/23/2018   Procedure: ESOPHAGOGASTRODUODENOSCOPY (EGD) WITH PROPOFOL;  Surgeon: Manya Silvas, MD;  Location[facility] ENDOSCOPY;  Service: Endoscopy;  Laterality: N/A;  . surgery for cervical neck fracture    . TOOTH EXTRACTION Right 09/28/2016    I have reviewed the social history and family history with the patient and they are unchanged from previous note.  ALLERGIES:  is allergic to codeine; codeine sulfate; morphine; and duloxetine.  MEDICATIONS:  Current Outpatient Medications  Medication Sig Dispense Refill  . ALPRAZolam (XANAX) 0.25 MG tablet Take 0.25 mg by mouth at bedtime as needed for anxiety or sleep.     . Avatrombopag Maleate 20 MG TABS Take 40 mg by mouth daily. Take 2 tabs daily 60 tablet 0  . baclofen (LIORESAL) 10 MG tablet Take 1 tablet (10 mg total) by mouth 3 (three) times daily. 270 tablet 0  . bisacodyl (DULCOLAX) 5 MG EC tablet Take 1 tablet (5 mg total) by mouth daily as needed for moderate constipation. 90 tablet 0  . cetirizine (ZYRTEC) 10 MG tablet Take 10 mg by mouth daily.  3  . folic acid (FOLVITE) 1 MG tablet Take 1 mg by mouth daily.     Marland Kitchen gabapentin (NEURONTIN) 600 MG tablet Take 1 tablet (600 mg total) by mouth 3 (three) times daily. 270 tablet 0  . omeprazole (PRILOSEC) 20 MG capsule Take 1 capsule (20 mg total) by mouth daily. 90 capsule 1  . ondansetron (ZOFRAN) 8 MG tablet Take 1 tablet (8 mg total) by mouth every 8 (eight) hours as needed for nausea or vomiting. 20 tablet 0  . [START ON 04/03/2019] oxyCODONE (OXY IR/ROXICODONE) 5 MG immediate release tablet Take 0.5 tablets (2.5 mg total) by mouth 2 (two) times daily for 30 days. 30 tablet 0  . [START ON 03/04/2019] oxyCODONE (OXY IR/ROXICODONE) 5 MG immediate release tablet Take 0.5 tablets (2.5 mg total) by mouth 2 (two) times daily for 30 days. 30 tablet 0  . oxyCODONE (OXY IR/ROXICODONE) 5 MG immediate release tablet Take 0.5 tablets (2.5 mg total) by mouth 2 (two) times daily  for 30 days. 30 tablet 0  . prochlorperazine (COMPAZINE) 10 MG tablet Take 1 tablet (10 mg total) by mouth every 6 (six) hours as needed for nausea or vomiting. 30 tablet 0   No current facility-administered medications for this visit.     PHYSICAL EXAMINATION: ECOG PERFORMANCE STATUS: 1 - Symptomatic but completely ambulatory  Vitals:   03/01/19 0854 03/01/19 0857  BP: 115/66 115/66  Pulse: 65 65  Resp: 18 18  Temp: 97.7 F (36.5 C) 97.7 F (36.5 C)  SpO2: 99% 99%   Filed Weights   03/01/19 0854 03/01/19 0857  Weight: 205 lb 3.2 oz (93.1 kg) 205 lb 3.2 oz (93.1 kg)    GENERAL:alert, no distress and comfortable SKIN: skin color, texture, turgor are normal, no rashes or significant lesions EYES: normal, Conjunctiva are pink and non-injected, sclera clear  NECK: supple, thyroid normal size, non-tender, without nodularity LYMPH:  no palpable lymphadenopathy in the cervical, axillary  LUNGS: clear to auscultation and percussion with normal breathing effort HEART: regular rate & rhythm and no murmurs and no lower extremity edema ABDOMEN:abdomen soft, non-tender and normal bowel sounds (+) Hepatomegaly, palpable 2-3cm below ribcage with tenderness  Musculoskeletal:no cyanosis of  digits and no clubbing  NEURO: alert & oriented x 3 with fluent speech, no focal motor/sensory deficits  LABORATORY DATA:  I have reviewed the data as listed CBC Latest Ref Rng & Units 03/01/2019 02/14/2019 02/07/2019  WBC 4.0 - 10.5 K/uL 2.7(L) 2.0(L) 1.6(L)  Hemoglobin 13.0 - 17.0 g/dL 10.8(L) 10.8(L) 11.2(L)  Hematocrit 39.0 - 52.0 % 34.1(L) 35.4(L) 36.0(L)  Platelets 150 - 400 K/uL 130(L) 211 44(L)     CMP Latest Ref Rng & Units 03/01/2019 02/14/2019 02/07/2019  Glucose 70 - 99 mg/dL 131(H) 113(H) 86  BUN 8 - 23 mg/dL 7(L) 7(L) 7(L)  Creatinine 0.61 - 1.24 mg/dL 0.84 0.84 0.82  Sodium 135 - 145 mmol/L 140 140 141  Potassium 3.5 - 5.1 mmol/L 3.7 4.0 3.8  Chloride 98 - 111 mmol/L 105 104 105  CO2 22 -  32 mmol/L _0 Calcium 8.9 - 10.3 mg/dL 8.9 9.0 9.0  Total Protein 6.5 - 8.1 g/dL 7.7 7.7 7.8  Total Bilirubin 0.3 - 1.2 mg/dL 0.6 0.6 0.5  Alkaline Phos 38 - 126 U/L 83 86 94  AST 15 - 41 U/L _1 ALT 0 - 44 U/L _2 RADIOGRAPHIC STUDIES: I have personally reviewed the radiological images as listed and agreed with the findings in the report. No results found.   ASSESSMENT & PLAN:  LOWELL MAKARA is a 63 y.o. 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 His liver biopsy showed poorly differentiated adenocarcinoma, CK7 positive, other markers were negative. Upper endoscopy was negative for malignancy.PET scan otherwise negative for other primary tumor. -His biopsy was evaluated by our pathologistDr. Rogue Jury here, who feels this ismost consistent with cholangiocarcinoma, with possible component of hepatocellular carcinoma. -CancertypeIDtestshowed 27% chance ofpancreaticobiliary primary -Patient did have a 1 cm hypermetabolic lung lesion in early 2019, which was treated with SBRT.No biopsy was obtained.Metastatic lung cancer to liver is also a possibility,although the immunostain was not typical. -He was seen by surgeon Dr. Zenia Resides at [facility] in 07/2018 who did not recommend surgery butreferredhim to IR for target liver therapy. -He underwentY90 radio embolization on12/19/19with Dr. Jearld Shines at [facility].  -Given hislivertumor is non-resectable, it's incurable at this stagebut still treatable. I started him on first line chemocisplatin and gemcitabine on 01/24/19 with dose reduction. Given COVID-19 and his thrombocytopenia, I will start him on chemo every 2 weeks. Will scan him after 2-3 months treatment.  -He is tolerating chemo well with fatigue with increased dose.  -He has mild hepatomegaly with tenderness from tumor. He notes mild intermittent pain, but controlled.   -Labs reviewed, CBC WNL except WBC 2.7, Hg 10.8, PLT 130K, CMP WNL, Mag 2.1. Ca 19.9 still pending. Overall adequate to proceed with Cisplatin/Gemcitabine at same dose.  -F/u in 2 weeks with NP Lacie and 4 week with me.  -repeat scan after cycle 6    2.Hepatitis B and liver cirrhosis -He doesn't see a hepatologist nor an infectious disease physician,he has never received hepatitis B treatment. -will repeat Hep BS antigen, Santibody, core antibody, and a viral load -No varices on 06/2018 Endoscopy.  -I encouraged him to return to GI Dr. Tiffany Kocher for management.  3. 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 -Given he is on chemotherapy, I previously prescribed him Doptelet. Currently on 52m daily.  -PLT improved to 130K today (03/01/19). Continue Doptelet BID.  4. Left apical lungcancer stage IA,s/p SBRT, COPD -PET scanon 11/24/2017 revealed a hypermetabolic 1.0 cm left apical pulmonary nodulefavoring malignancy. Assuming non-small cell lung cancer this represents T1a N0 M0 disease (stage IA).Biopsy was not obtained -Underwent SBRT 01/05/18-02/01/18 -CT chest from 11/28/18 shows good improvement and no new nodules.  5. Social Support  -He lives by himself.  -He has 2 brothers but 1 does not live very close. -I discussed our resources to help with transportation if needed.  6. Hemorrhoids  -He does have hemorrhoids which are more likely to bleed due to thrombocytopenia. -I recommend he avoid constipation by taking Laxative daily and OTC cream/suppository. If not enough I discussed GI can do banding, he declined for now. I encouraged him to follow up with him. -No bleeding indicated.   PLAN: -I refilled Doptelet, Prilosec today  -Labs reviewed and adequate to proceed with Cisplatin and Gemcitabine today at same dose  -Lab, f/u, and chemo in 2 weeks    No problem-specific  Assessment & Plan notes found for this encounter.   No orders of the defined types were placed in this encounter.  All questions were answered. The patient knows to call the clinic with any problems, questions or concerns. No barriers to learning was detected. I spent 20 minutes counseling the patient face to face. The total time spent in the appointment was 25 minutes and more than 50% was on counseling and review of test results     [name], MD 03/01/2019   I, Joslyn Devon, am acting as scribe for [name], MD.   I have reviewed the above documentation for accuracy and completeness, and I agree with the above.

## 2019-02-28 ENCOUNTER — Other Ambulatory Visit: Payer: Self-pay | Admitting: Hematology

## 2019-02-28 DIAGNOSIS — C221 Intrahepatic bile duct carcinoma: Secondary | ICD-10-CM

## 2019-03-01 ENCOUNTER — Inpatient Hospital Stay: Payer: Medicare Other

## 2019-03-01 ENCOUNTER — Telehealth: Payer: Self-pay | Admitting: Hematology

## 2019-03-01 ENCOUNTER — Inpatient Hospital Stay (HOSPITAL_BASED_OUTPATIENT_CLINIC_OR_DEPARTMENT_OTHER): Payer: Medicare Other | Admitting: Hematology

## 2019-03-01 ENCOUNTER — Other Ambulatory Visit: Payer: Self-pay

## 2019-03-01 ENCOUNTER — Other Ambulatory Visit: Payer: Self-pay | Admitting: *Deleted

## 2019-03-01 ENCOUNTER — Encounter: Payer: Self-pay | Admitting: Hematology

## 2019-03-01 VITALS — BP 115/66 | HR 65 | Temp 97.7°F | Resp 18 | Ht 72.0 in | Wt 205.2 lb

## 2019-03-01 DIAGNOSIS — Z79899 Other long term (current) drug therapy: Secondary | ICD-10-CM | POA: Diagnosis not present

## 2019-03-01 DIAGNOSIS — J449 Chronic obstructive pulmonary disease, unspecified: Secondary | ICD-10-CM

## 2019-03-01 DIAGNOSIS — D61818 Other pancytopenia: Secondary | ICD-10-CM

## 2019-03-01 DIAGNOSIS — K649 Unspecified hemorrhoids: Secondary | ICD-10-CM | POA: Diagnosis not present

## 2019-03-01 DIAGNOSIS — D696 Thrombocytopenia, unspecified: Secondary | ICD-10-CM

## 2019-03-01 DIAGNOSIS — C221 Intrahepatic bile duct carcinoma: Secondary | ICD-10-CM

## 2019-03-01 DIAGNOSIS — C229 Malignant neoplasm of liver, not specified as primary or secondary: Secondary | ICD-10-CM

## 2019-03-01 DIAGNOSIS — K7030 Alcoholic cirrhosis of liver without ascites: Secondary | ICD-10-CM

## 2019-03-01 DIAGNOSIS — Z5111 Encounter for antineoplastic chemotherapy: Secondary | ICD-10-CM | POA: Diagnosis not present

## 2019-03-01 LAB — CMP (CANCER CENTER ONLY)
ALT: 18 U/L (ref 0–44)
AST: 28 U/L (ref 15–41)
Albumin: 3.8 g/dL (ref 3.5–5.0)
Alkaline Phosphatase: 83 U/L (ref 38–126)
Anion gap: 11 (ref 5–15)
BUN: 7 mg/dL — ABNORMAL LOW (ref 8–23)
CO2: 24 mmol/L (ref 22–32)
Calcium: 8.9 mg/dL (ref 8.9–10.3)
Chloride: 105 mmol/L (ref 98–111)
Creatinine: 0.84 mg/dL (ref 0.61–1.24)
GFR, Est AFR Am: >60 mL/min (ref >60)
GFR, Estimated: >60 mL/min (ref >60)
Glucose, Bld: 131 mg/dL — ABNORMAL HIGH (ref 70–99)
Potassium: 3.7 mmol/L (ref 3.5–5.1)
Sodium: 140 mmol/L (ref 135–145)
Total Bilirubin: 0.6 mg/dL (ref 0.3–1.2)
Total Protein: 7.7 g/dL (ref 6.5–8.1)

## 2019-03-01 LAB — CBC WITH DIFFERENTIAL (CANCER CENTER ONLY)
Abs Immature Granulocytes: 0 10*3/uL (ref 0.00–0.07)
Basophils Absolute: 0 10*3/uL (ref 0.0–0.1)
Basophils Relative: 0 %
Eosinophils Absolute: 0.1 10*3/uL (ref 0.0–0.5)
Eosinophils Relative: 3 %
HCT: 34.1 % — ABNORMAL LOW (ref 39.0–52.0)
Hemoglobin: 10.8 g/dL — ABNORMAL LOW (ref 13.0–17.0)
Immature Granulocytes: 0 %
Lymphocytes Relative: 13 %
Lymphs Abs: 0.3 10*3/uL — ABNORMAL LOW (ref 0.7–4.0)
MCH: 29.7 pg (ref 26.0–34.0)
MCHC: 31.7 g/dL (ref 30.0–36.0)
MCV: 93.7 fL (ref 80.0–100.0)
Monocytes Absolute: 0.4 10*3/uL (ref 0.1–1.0)
Monocytes Relative: 13 %
Neutro Abs: 2 10*3/uL (ref 1.7–7.7)
Neutrophils Relative %: 71 %
Platelet Count: 130 10*3/uL — ABNORMAL LOW (ref 150–400)
RBC: 3.64 MIL/uL — ABNORMAL LOW (ref 4.22–5.81)
RDW: 18.3 % — ABNORMAL HIGH (ref 11.5–15.5)
WBC Count: 2.7 10*3/uL — ABNORMAL LOW (ref 4.0–10.5)
nRBC: 0 % (ref 0.0–0.2)

## 2019-03-01 LAB — MAGNESIUM: Magnesium: 2.1 mg/dL (ref 1.7–2.4)

## 2019-03-01 MED ORDER — OMEPRAZOLE 20 MG PO CPDR: 20.0000 mg | DELAYED_RELEASE_CAPSULE | Freq: Every day | ORAL | 1 refills | Status: DC

## 2019-03-01 MED ORDER — METHYLPREDNISOLONE SODIUM SUCC 125 MG IJ SOLR
INTRAMUSCULAR | Status: AC
  Filled 2019-03-01: qty 2

## 2019-03-01 MED ORDER — SODIUM CHLORIDE 0.9 % IV SOLN
25.0000 mg/m2 | Freq: Once | INTRAVENOUS | Status: AC
  Administered 2019-03-01: 14:00:00 54 mg via INTRAVENOUS
  Filled 2019-03-01: qty 54

## 2019-03-01 MED ORDER — SODIUM CHLORIDE 0.9 % IV SOLN
Freq: Once | INTRAVENOUS | Status: AC
  Administered 2019-03-01: 10:00:00 via INTRAVENOUS
  Filled 2019-03-01: qty 250

## 2019-03-01 MED ORDER — PALONOSETRON HCL INJECTION 0.25 MG/5ML
0.2500 mg | Freq: Once | INTRAVENOUS | Status: AC
  Administered 2019-03-01: 12:00:00 0.25 mg via INTRAVENOUS

## 2019-03-01 MED ORDER — METHYLPREDNISOLONE SODIUM SUCC 125 MG IJ SOLR
60.0000 mg | Freq: Once | INTRAMUSCULAR | Status: AC
  Administered 2019-03-01: 12:00:00 60 mg via INTRAVENOUS

## 2019-03-01 MED ORDER — PALONOSETRON HCL INJECTION 0.25 MG/5ML
INTRAVENOUS | Status: AC
  Filled 2019-03-01: qty 5

## 2019-03-01 MED ORDER — SODIUM CHLORIDE 0.9 % IV SOLN
800.0000 mg/m2 | Freq: Once | INTRAVENOUS | Status: AC
  Administered 2019-03-01: 13:00:00 1748 mg via INTRAVENOUS
  Filled 2019-03-01: qty 45.97

## 2019-03-01 MED ORDER — POTASSIUM CHLORIDE 2 MEQ/ML IV SOLN
Freq: Once | INTRAVENOUS | Status: AC
  Administered 2019-03-01: 10:00:00 via INTRAVENOUS
  Filled 2019-03-01: qty 10

## 2019-03-01 MED ORDER — AVATROMBOPAG MALEATE 20 MG PO TABS: 40.0000 mg | ORAL_TABLET | Freq: Every day | ORAL | 2 refills | Status: DC

## 2019-03-01 NOTE — Telephone Encounter (Signed)
Scheduled appt per 6/25 los.

## 2019-03-01 NOTE — Progress Notes (Signed)
Faxed script for Avatrombopag to  [facility]       Fax     [phone].       Phone      [phone].

## 2019-03-01 NOTE — Patient Instructions (Signed)
[place] Discharge Instructions for Patients Receiving Chemotherapy  Today you received the following chemotherapy agents: Gemzar and Cisplatin.  To help prevent nausea and vomiting after your treatment, we encourage you to take your nausea medication as prescribed.   If you develop nausea and vomiting that is not controlled by your nausea medication, call the clinic.   BELOW ARE SYMPTOMS THAT SHOULD BE REPORTED IMMEDIATELY:  *FEVER GREATER THAN 100.5 F  *CHILLS WITH OR WITHOUT FEVER  NAUSEA AND VOMITING THAT IS NOT CONTROLLED WITH YOUR NAUSEA MEDICATION  *UNUSUAL SHORTNESS OF BREATH  *UNUSUAL BRUISING OR BLEEDING  TENDERNESS IN MOUTH AND THROAT WITH OR WITHOUT PRESENCE OF ULCERS  *URINARY PROBLEMS  *BOWEL PROBLEMS  UNUSUAL RASH Items with * indicate a potential emergency and should be followed up as soon as possible.  Feel free to call the clinic should you have any questions or concerns. The clinic phone number is [phone].  Please show the [place] at check-in to the Emergency Department and triage nurse.

## 2019-03-02 LAB — CANCER ANTIGEN 19-9: CA 19-9: 26 U/mL (ref 0–35)

## 2019-03-07 ENCOUNTER — Ambulatory Visit: Payer: Medicare Other | Admitting: Hematology

## 2019-03-07 ENCOUNTER — Ambulatory Visit: Payer: Medicare Other

## 2019-03-07 ENCOUNTER — Other Ambulatory Visit: Payer: Medicare Other

## 2019-03-14 ENCOUNTER — Other Ambulatory Visit: Payer: Self-pay

## 2019-03-14 ENCOUNTER — Encounter: Payer: Self-pay | Admitting: Nurse Practitioner

## 2019-03-14 ENCOUNTER — Inpatient Hospital Stay: Payer: Medicare Other | Attending: Hematology

## 2019-03-14 ENCOUNTER — Inpatient Hospital Stay (HOSPITAL_BASED_OUTPATIENT_CLINIC_OR_DEPARTMENT_OTHER): Payer: Medicare Other | Admitting: Nurse Practitioner

## 2019-03-14 ENCOUNTER — Inpatient Hospital Stay: Payer: Medicare Other

## 2019-03-14 VITALS — BP 124/65 | HR 62 | Temp 98.6°F | Resp 17 | Ht 72.0 in | Wt 206.1 lb

## 2019-03-14 DIAGNOSIS — K649 Unspecified hemorrhoids: Secondary | ICD-10-CM | POA: Insufficient documentation

## 2019-03-14 DIAGNOSIS — Z79899 Other long term (current) drug therapy: Secondary | ICD-10-CM

## 2019-03-14 DIAGNOSIS — K5900 Constipation, unspecified: Secondary | ICD-10-CM | POA: Diagnosis not present

## 2019-03-14 DIAGNOSIS — D6959 Other secondary thrombocytopenia: Secondary | ICD-10-CM

## 2019-03-14 DIAGNOSIS — C221 Intrahepatic bile duct carcinoma: Secondary | ICD-10-CM

## 2019-03-14 DIAGNOSIS — Z5111 Encounter for antineoplastic chemotherapy: Secondary | ICD-10-CM | POA: Diagnosis not present

## 2019-03-14 DIAGNOSIS — K7460 Unspecified cirrhosis of liver: Secondary | ICD-10-CM | POA: Insufficient documentation

## 2019-03-14 DIAGNOSIS — C3492 Malignant neoplasm of unspecified part of left bronchus or lung: Secondary | ICD-10-CM | POA: Diagnosis not present

## 2019-03-14 DIAGNOSIS — C229 Malignant neoplasm of liver, not specified as primary or secondary: Secondary | ICD-10-CM

## 2019-03-14 DIAGNOSIS — Z923 Personal history of irradiation: Secondary | ICD-10-CM | POA: Diagnosis not present

## 2019-03-14 LAB — MAGNESIUM: Magnesium: 2.1 mg/dL (ref 1.7–2.4)

## 2019-03-14 LAB — CMP (CANCER CENTER ONLY)
ALT: 13 U/L (ref 0–44)
AST: 26 U/L (ref 15–41)
Albumin: 3.8 g/dL (ref 3.5–5.0)
Alkaline Phosphatase: 89 U/L (ref 38–126)
Anion gap: 9 (ref 5–15)
BUN: 7 mg/dL — ABNORMAL LOW (ref 8–23)
CO2: 25 mmol/L (ref 22–32)
Calcium: 8.9 mg/dL (ref 8.9–10.3)
Chloride: 105 mmol/L (ref 98–111)
Creatinine: 0.8 mg/dL (ref 0.61–1.24)
GFR, Est AFR Am: >60 mL/min (ref >60)
GFR, Estimated: >60 mL/min (ref >60)
Glucose, Bld: 99 mg/dL (ref 70–99)
Potassium: 4 mmol/L (ref 3.5–5.1)
Sodium: 139 mmol/L (ref 135–145)
Total Bilirubin: 0.7 mg/dL (ref 0.3–1.2)
Total Protein: 7.7 g/dL (ref 6.5–8.1)

## 2019-03-14 LAB — CBC WITH DIFFERENTIAL (CANCER CENTER ONLY)
Abs Immature Granulocytes: 0 10*3/uL (ref 0.00–0.07)
Basophils Absolute: 0 10*3/uL (ref 0.0–0.1)
Basophils Relative: 0 %
Eosinophils Absolute: 0 10*3/uL (ref 0.0–0.5)
Eosinophils Relative: 1 %
HCT: 31.4 % — ABNORMAL LOW (ref 39.0–52.0)
Hemoglobin: 9.9 g/dL — ABNORMAL LOW (ref 13.0–17.0)
Immature Granulocytes: 0 %
Lymphocytes Relative: 10 %
Lymphs Abs: 0.3 10*3/uL — ABNORMAL LOW (ref 0.7–4.0)
MCH: 29.6 pg (ref 26.0–34.0)
MCHC: 31.5 g/dL (ref 30.0–36.0)
MCV: 93.7 fL (ref 80.0–100.0)
Monocytes Absolute: 0.4 10*3/uL (ref 0.1–1.0)
Monocytes Relative: 15 %
Neutro Abs: 2 10*3/uL (ref 1.7–7.7)
Neutrophils Relative %: 74 %
Platelet Count: 82 10*3/uL — ABNORMAL LOW (ref 150–400)
RBC: 3.35 MIL/uL — ABNORMAL LOW (ref 4.22–5.81)
RDW: 18.5 % — ABNORMAL HIGH (ref 11.5–15.5)
WBC Count: 2.7 10*3/uL — ABNORMAL LOW (ref 4.0–10.5)
nRBC: 0 % (ref 0.0–0.2)

## 2019-03-14 MED ORDER — PALONOSETRON HCL INJECTION 0.25 MG/5ML
0.2500 mg | Freq: Once | INTRAVENOUS | Status: AC
  Administered 2019-03-14: 13:00:00 0.25 mg via INTRAVENOUS

## 2019-03-14 MED ORDER — SODIUM CHLORIDE 0.9 % IV SOLN
600.0000 mg/m2 | Freq: Once | INTRAVENOUS | Status: AC
  Administered 2019-03-14: 1292 mg via INTRAVENOUS
  Filled 2019-03-14: qty 33.98

## 2019-03-14 MED ORDER — SODIUM CHLORIDE 0.9% FLUSH
10.0000 mL | INTRAVENOUS | Status: DC | PRN
  Filled 2019-03-14: qty 10

## 2019-03-14 MED ORDER — METHYLPREDNISOLONE SODIUM SUCC 40 MG IJ SOLR
60.0000 mg | Freq: Once | INTRAMUSCULAR | Status: AC
  Administered 2019-03-14: 60 mg via INTRAVENOUS

## 2019-03-14 MED ORDER — SODIUM CHLORIDE 0.9 % IV SOLN
Freq: Once | INTRAVENOUS | Status: AC
  Administered 2019-03-14: 11:00:00 via INTRAVENOUS
  Filled 2019-03-14: qty 250

## 2019-03-14 MED ORDER — SODIUM CHLORIDE 0.9 % IV SOLN
Freq: Once | INTRAVENOUS | Status: AC
  Administered 2019-03-14: 13:00:00 via INTRAVENOUS
  Filled 2019-03-14: qty 250

## 2019-03-14 MED ORDER — SODIUM CHLORIDE 0.9 % IV SOLN
25.0000 mg/m2 | Freq: Once | INTRAVENOUS | Status: AC
  Administered 2019-03-14: 54 mg via INTRAVENOUS
  Filled 2019-03-14: qty 54

## 2019-03-14 MED ORDER — METHYLPREDNISOLONE SODIUM SUCC 40 MG IJ SOLR
INTRAMUSCULAR | Status: AC
  Filled 2019-03-14: qty 1

## 2019-03-14 MED ORDER — POTASSIUM CHLORIDE 2 MEQ/ML IV SOLN
Freq: Once | INTRAVENOUS | Status: AC
  Administered 2019-03-14: 11:00:00 via INTRAVENOUS
  Filled 2019-03-14: qty 10

## 2019-03-14 MED ORDER — PALONOSETRON HCL INJECTION 0.25 MG/5ML
INTRAVENOUS | Status: AC
  Filled 2019-03-14: qty 5

## 2019-03-14 MED ORDER — HEPARIN SOD (PORK) LOCK FLUSH 100 UNIT/ML IV SOLN
500.0000 [IU] | Freq: Once | INTRAVENOUS | Status: DC | PRN
  Filled 2019-03-14: qty 5

## 2019-03-14 MED ORDER — METHYLPREDNISOLONE SODIUM SUCC 125 MG IJ SOLR
INTRAMUSCULAR | Status: AC
  Filled 2019-03-14: qty 2

## 2019-03-14 NOTE — Progress Notes (Signed)
Per Cira Rue, NP, ok to treat with Platelets of 82.

## 2019-03-14 NOTE — Progress Notes (Signed)
[place]   Telephone:(336) [phone] Fax:(336) [phone]   Clinic Follow up Note   Patient Care Team: Cletis Athens, MD as PCP - General (Internal Medicine) Manya Silvas, MD (Gastroenterology) 03/14/2019  CHIEF COMPLAINT: f/u adenocarcinoma in liver    SUMMARY OF ONCOLOGIC HISTORY: Oncology History  Cholangiocarcinoma ([place])  05/05/2018 Initial Biopsy   DIAGNOSIS: 05/05/18 A. LIVER MASS; ULTRASOUND-GUIDED BIOPSY:  - CYTOKERATIN 7 POSITIVE ADENOCARCINOMA WITH ABUNDANT NECROSIS, SEE  COMMENT.  Diagnosis 06/28/18 Consult- Comprehensive, [phone] Liver Bx - POORLY DIFFERENTIATED CARCINOMA INVOLVING LIVER PARENCHYMA. SEE NOTE. Diagnosis Note Most of the tumor shows a abortive glad formation, consistent with an adenocarcinoma. However, in some areas, the tumor cells show increased cytoplasm with a nested architecture, giving them a hepatoid appearance. Immunohistochemical stains performed at the outside institution show that the tumor cells are positive for CK7 and negative for TTF-1, Napsin-A, CDX-2, Hepar-1 and CD34. Immunostains performed at [facility] show that glypican-3 has patchy positive staining in the hepatoid-appearing tumor cells. Arginase and AFP stains show very focal, weak staining. Immunostain of pCEA shows focal canalicular-like staining pattern in the tumor cells. The findings are consistent with a cholangiocarcinoma but the the tumor histomorphology and staining pattern are suggestive of a hepatocellular carcinoma component, raising possibility of a combined hepatocellular cholangiocarcinoma. Additional biopsies or re-evaluation on the resection specimen is suggested for confirmation.   06/02/2018 PET scan   PET 06/02/18  IMPRESSION: 1. Solitary hypermetabolic lesion in the central LEFT hepatic lobe consistent with malignancy. 2. No metabolic activity remaining in LEFT apical pulmonary nodule which is reduced in size. 3. No evidence of metastatic  adenopathy on skull base to thigh FDG PET scan. 4. Cirrhotic liver.  Cholelithiasis 5.  Aortic Atherosclerosis (ICD10-I70.0).   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 Jearld Shines at [facility]  Estimated dose to liver target: 117 Gy Estimated dose to lung: 5.56 Gy Impression: Successful radioembolization with administration of intravascular yttrium-90 Theraspheres as described above.   10/18/2018 Imaging   US Abdomen 10/18/18 IMPRESSION: 1. Cholelithiasis without evidence of acute cholecystitis. 2. 2.2 cm left hepatic lobe mass consistent with known malignancy. 3. Cirrhosis and splenomegaly.   11/09/2018 Imaging   MRI Abdomen 11/09/18 at [facility]  Impression: Marked increased size of the mass centered in segment 4A with new satellite lesions seen in segments 2 and 3, likely representing multifocal mass-forming cholangiocarcinoma. New tumor invasion seen within the right anterior portal vein and middle hepatic vein to the level of the IVC, which is a pattern more commonly seen with hepatocellular carcinoma.   11/25/2018 Miscellaneous   Foundation One 3/21.2020  MSI -Stable - cannot be determined  Tumor Mutation Burden - cannot be determined  TERT promoter 124C>T TP53 [id number]   11/28/2018 Imaging   CT Chest 11/28/18  IMPRESSION: 1. Further improvement in left apical nodule, now nearly resolved. Mild surrounding radiation changes. 2. No new or enlarging pulmonary nodules. 3. Treated lesion in the left hepatic lobe is not optimally visualized on this noncontrast study, although appears slightly larger. 4. Progressive adenopathy in the upper abdomen. 5. Additional incidental findings are stable, including cirrhosis, splenomegaly, aortic valvular calcifications, Aortic Atherosclerosis (ICD10-I70.0) and Emphysema (ICD10-J43.9).   12/25/2018 Cancer Staging   Staging form: Intrahepatic Bile Duct, AJCC  8th Edition - Clinical stage from 12/25/2018: Stage II (cT2(m), cN0, cM0) - Signed by [name], MD on 12/27/2018   01/24/2019 -  Chemotherapy   First line  Cisplatin and Gemcitabine every 2 weeks starting 01/24/19  -He did have reaction to IV Emend and dexa with SOB, chest pain and flushing, will d/c and will switch dexa to Solu-Medrol on cycle 2.      CURRENT THERAPY: ChemoGemcitabine and Cisplatinevery 2 weeks starting 01/24/19  INTERVAL HISTORY: Mr. Moccia returns for f/u and chemotherapy as scheduled. He completed cycle 3 gemcitabine and cisplatin on 03/01/19. He is fatigued after chemo, requiring more effort to complete ADLs. Appetite is fair. After cycles 2 and 3 he developed neck pain lasting 4 days after chemo. He notes pain is on whichever side the IV is placed during treatment. Has difficulty turning his head. He has h/o pinched nerve causing peripheral neuropathy, which is stable since starting chemo. He is on gabapentin. Bruises to arms from IV sites. He had low grade fever 99 over the weekend with "upset stomach" during BM, denies n/v/c/d. Stools were lose. The episode resolved. Denies dysuria. Denies bleeding. denies cough, dyspnea. Has intermittent left chest pains that began after left lung radiation. Abdominal pain is intermittent, stable on current pain regimen.     MEDICAL HISTORY:  Past Medical History:  Diagnosis Date  . Abnormal MRI, lumbar spine (2015) 01/21/2016   FINDINGS:  L3-4: Tiny right foraminal and extra foraminal disc bulge adjacent to but not compressing the L3 nerve lateral to the neural foramen. L4-5: There is a small broad-based disc bulge. There is also hypertrophy of the ligamentum flavum and facet joints, left more than right creating moderately severe spinal stenosis and bilateral lateral recess stenosis, left greater than right. This appe  . Anxiety   . Back ache 05/06/2014  . Cervical pain 05/06/2014  . 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[place]) 06/23/2015  .  COPD (chronic obstructive pulmonary disease) ([place])   . De Quervain's tenosynovitis, left 07/07/2017  . Depression   . Dystonia   . Epileptic disorder ([place]) 06/23/2015  . Extremity pain 05/06/2014  . Febrile seizures ([place])    child  . Foot pain 09/11/2014  . Gastric ulcer 06/23/2015  . Gout   . H/O neoplasm 06/23/2015  . Head injury 06/23/2015  . Hemorrhoids   . Hepatic cirrhosis ([place]) 06/23/2015  . Hepatitis B   . History of GI bleed   . Leukopenia   . Liver cancer ([place]) 08/2018  . Neurogenic pain 01/21/2016  . Nodule of upper lobe of left lung 12/02/2017  . Non-small cell cancer of left lung ([place]) 2019   Rad tx's.   . Obstructive sleep apnea 06/23/2015  . 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 Osteoarthritis of spine with radiculopathy, lumbosacral region 06/23/2015  . Peripheral neuropathy 06/23/2015  . Peripheral neuropathy ([place]) 11/94/1740  . Personal history of tobacco use, presenting hazards to health 01/09/2016  . Pulse irregularity   . Seizures ([place])   . Spinal stenosis   . Splenomegaly 05/30/2013  . Thrombocytopenia ([place]) 06/23/2015  . Uncomplicated opioid dependence ([place]) 06/23/2015    SURGICAL HISTORY: Past Surgical History:  Procedure Laterality Date  . ESOPHAGOGASTRODUODENOSCOPY  11/2013  . ESOPHAGOGASTRODUODENOSCOPY (EGD) WITH PROPOFOL N/A 06/23/2018   Procedure: ESOPHAGOGASTRODUODENOSCOPY (EGD) WITH PROPOFOL;  Surgeon: Manya Silvas, MD;  Location: [facility] ENDOSCOPY;  Service: Endoscopy;  Laterality: N/A;  . surgery for cervical neck fracture    . TOOTH EXTRACTION Right 09/28/2016    I have reviewed the social history and family history with the patient and they are unchanged from previous note.  ALLERGIES:  is allergic to codeine; codeine  sulfate; morphine; and duloxetine.  MEDICATIONS:  Current Outpatient Medications  Medication Sig Dispense Refill  . ALPRAZolam (XANAX) 0.25 MG tablet Take 0.25 mg by mouth at bedtime  as needed for anxiety or sleep.     . Avatrombopag Maleate 20 MG TABS Take 40 mg by mouth daily. Take 2 tabs = 40 mg   daily 60 tablet 2  . baclofen (LIORESAL) 10 MG tablet Take 1 tablet (10 mg total) by mouth 3 (three) times daily. 270 tablet 0  . bisacodyl (DULCOLAX) 5 MG EC tablet Take 1 tablet (5 mg total) by mouth daily as needed for moderate constipation. 90 tablet 0  . cetirizine (ZYRTEC) 10 MG tablet Take 10 mg by mouth daily.  3  . folic acid (FOLVITE) 1 MG tablet Take 1 mg by mouth daily.     Marland Kitchen gabapentin (NEURONTIN) 600 MG tablet Take 1 tablet (600 mg total) by mouth 3 (three) times daily. 270 tablet 0  . omeprazole (PRILOSEC) 20 MG capsule Take 1 capsule (20 mg total) by mouth daily. 90 capsule 1  . ondansetron (ZOFRAN) 8 MG tablet Take 1 tablet (8 mg total) by mouth every 8 (eight) hours as needed for nausea or vomiting. 20 tablet 0  . [START ON 04/03/2019] oxyCODONE (OXY IR/ROXICODONE) 5 MG immediate release tablet Take 0.5 tablets (2.5 mg total) by mouth 2 (two) times daily for 30 days. 30 tablet 0  . prochlorperazine (COMPAZINE) 10 MG tablet Take 1 tablet (10 mg total) by mouth every 6 (six) hours as needed for nausea or vomiting. 30 tablet 0   No current facility-administered medications for this visit.    Facility-Administered Medications Ordered in Other Visits  Medication Dose Route Frequency Provider Last Rate Last Dose  . 0.9 %  sodium chloride infusion   Intravenous Once [name], MD      . CISplatin (PLATINOL) 54 mg in sodium chloride 0.9 % 250 mL chemo infusion  25 mg/m2 (Treatment Plan Recorded) Intravenous Once [name], MD      . gemcitabine (GEMZAR) 1,292 mg in sodium chloride 0.9 % 250 mL chemo infusion  600 mg/m2 (Treatment Plan Recorded) Intravenous Once [name], MD      . heparin lock flush 100 unit/mL  500 Units Intracatheter Once PRN [name], MD      . methylPREDNISolone sodium succinate (SOLU-MEDROL) 40 mg/mL injection 60 mg  60 mg Intravenous Once [name], MD      . palonosetron (ALOXI) injection 0.25 mg  0.25 mg Intravenous Once [name], MD      . sodium chloride flush (NS) 0.9 % injection 10 mL  10 mL Intracatheter PRN [name], MD        PHYSICAL EXAMINATION: ECOG PERFORMANCE STATUS: 1 - Symptomatic but completely ambulatory  Vitals:   03/14/19 0911  BP: 124/65  Pulse: 62  Resp: 17  Temp: 98.6 F (37 C)  SpO2: 98%   Filed Weights   03/14/19 0911  Weight: 206 lb 1.6 oz (93.5 kg)    GENERAL:alert, no distress and comfortable SKIN: no obvious rash. Generalized bruising to arms bilaterally  EYES: sclera clear LUNGS: clear to auscultation with normal breathing effort HEART: regular rate & rhythm, no lower extremity edema ABDOMEN:abdomen round, scattered cherry angiomas to abdomen Musculoskeletal:no cyanosis of digits  NEURO: alert & oriented x 3 with fluent speech, normal gait   LABORATORY DATA:  I have reviewed the data as listed CBC Latest Ref Rng & Units 03/14/2019 03/01/2019 02/14/2019  WBC 4.0 - 10.5 K/uL 2.7(L) 2.7(L) 2.0(L)  Hemoglobin 13.0 - 17.0 g/dL 9.9(L) 10.8(L) 10.8(L)  Hematocrit 39.0 - 52.0 % 31.4(L) 34.1(L) 35.4(L)  Platelets 150 - 400 K/uL 82(L) 130(L) 211     CMP Latest Ref Rng & Units 03/14/2019 03/01/2019 02/14/2019  Glucose 70 - 99 mg/dL 99 131(H) 113(H)  BUN 8 - 23 mg/dL 7(L) 7(L) 7(L)  Creatinine 0.61 - 1.24 mg/dL 0.80 0.84 0.84  Sodium 135 - 145 mmol/L 139 140 140  Potassium 3.5 - 5.1 mmol/L 4.0 3.7 4.0  Chloride 98 - 111 mmol/L 105 105 104  CO2 22 - 32 mmol/L '25 24 26  ' Calcium 8.9 - 10.3 mg/dL 8.9 8.9 9.0  Total Protein 6.5 - 8.1 g/dL 7.7 7.7 7.7  Total Bilirubin 0.3 - 1.2 mg/dL 0.7 0.6 0.6  Alkaline Phos 38 - 126 U/L 89 83 86  AST 15 - 41 U/L '26 28 24  ' ALT 0 - 44 U/L '13 18 15      ' RADIOGRAPHIC STUDIES: I have personally reviewed the radiological images as listed and agreed with the findings in the report. No results found.   ASSESSMENT & PLAN: Taryn Nave Dodsonis a 63 y.o.malewith    1.Poorly differentiated adenocarcinoma in liver,probablecholangiocarcinoma, unresectable -He was diagnosed in 04/2018. His liver biopsy showed poorly differentiated adenocarcinoma, CK7 positive, other markers were negative. Upper endoscopy was negative for malignancy.PET scan otherwise negative for other primary tumor. -His biopsy was evaluated by our pathologistDr. Vic Ripper here, who feels this ismost consistent with cholangiocarcinoma, with possible component of hepatocellular carcinoma. -CancertypeIDtestshowed 81% chance ofpancreaticobiliary primary -Patient did have a 1 cm hypermetabolic lung lesion in early 2019, which was treated with SBRT.No biopsy was obtained.Metastatic lung cancer to liver is also a possibility,although the immunostain was not typical. -He was seen by surgeon Dr. Zenia Resides at [facility] in 07/2018 who did not recommend surgery butreferredhim to IR for target liver therapy. -He underwentY90 radio embolization on12/19/19with Dr. Jearld Shines at [facility].  -GIven his unresectable disease, Dr. Verline Lema him onfirst linechemocisplatin and gemcitabineon 5/20/20with dose reduction.Given COVID-19and his thrombocytopenia,he getschemo every 2 weeks.He tolerated first week well with mild nausea, controlled constipation. -He did have reaction to IV Emendand dexawith SOB, chest pain and flushing; from cycle 2 will get solu-medrol and aloxi -Cycle 2 delayed for neutropenia ANC 1.1 and thrombocytopenia PLT 44K. Instructed to increase doptelet to 40 mg daily  -s/p cycle 3 on 03/01/19   2.Hepatitis B and liver cirrhosiswithout varices on EGD 06/2018  -per labs 06/2018: hep B surface antibody - reactive; hep B surface antigen - negative; Hep B core antibody - detected  -He did not have treatment for hep B -previously instructed to f/u with GI Dr. Tiffany Kocher  3. 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 -beganDoptelet 37m daily to maintain his PLT on 02/01/19. -platelet count responded to 40 mg daily, then began to decrease; last week was instructed to take 1 tab TID (60 mg)   4. Left apical lungcancer stage IA,s/p SBRT, COPD -PET scanon 11/24/2017 revealed a hypermetabolic 1.0 cm left apical pulmonary nodulefavoring malignancy. Assuming non-small cell lung cancer this represents T1a N0 M0 disease (stage IA).Biopsy was not obtained -Underwent SBRT 01/05/18-02/01/18 -CT chest from 11/28/18 shows good improvement and no new nodules.this was previously reviewed  5. Social Support  -He lives by himself.Has 2 brothers but 1 does not live very close. -has utilized our tBuilding surveyor 6. Hemorrhoids -denies bleeding -takes  laxative 1-2x daily for constipation  Mr. Hyams appears well today. He completed cycle 3 gemcitabine and cisplatin. He tolerates well overall with fatigue. He is able to recover well. Abdominal pain is stable. He developed neck pain after last 2 cycles, lasting 4 days. Pain is on the side of IV with infusing chemotherapy. May be related to nerve irritation. I encouraged him to use heating pad to his arm and neck PRN. He has h/o pinched nerve in his neck. Otherwise he is doing well.   Labs reivewed, PLT 82K. He denies bleeding. Recently he increased Doptelet to 20 mg TID. I encouraged him to continue. I reviewed with Dr. Burr Medico. Will dose-reduce gemcitabine to 600 mg/m2 today and continue cisplatin at current dose. Restage after cycle 4, order placed today. Patient is considering PAC placement, but wants to wait until after scan. F/u in 2 weeks.   PLAN: -Labs reviewed -Continue Doptelet 20 mg TID -Proceed with cycle 4 today, dose-reduce gemcitabine to 600 mg/m2, cisplatin at current dose  -Restaging scan after cycle 4, ordered today -F/u in 2 weeks   No problem-specific Assessment & Plan notes found for this encounter.    Orders Placed This Encounter  Procedures  . MR Abdomen W Wo Contrast    Patient prefers [place] location    Standing Status:   Future    Standing Expiration Date:   05/14/2020    Order Specific Question:   ** REASON FOR EXAM (FREE TEXT)    Answer:   cancer in liver, s/p Y90 currently on chemotherapy; monitor treatment response    Order Specific Question:   If indicated for the ordered procedure, I authorize the administration of contrast media per Radiology protocol    Answer:   Yes    Order Specific Question:   What is the patient's sedation requirement?    Answer:   No Sedation    Order Specific Question:   Does the patient have a pacemaker or implanted devices?    Answer:   No    Order Specific Question:   Radiology Contrast Protocol - do NOT remove file path    Answer:   [email or web address]    Order Specific Question:   Preferred imaging location?    Answer:   [facility] (table limit-400lbs)   All questions were answered. The patient knows to call the clinic with any problems, questions or concerns. No barriers to learning was detected.     Alla Feeling, NP 03/14/19

## 2019-03-14 NOTE — Patient Instructions (Signed)
[place] Discharge Instructions for Patients Receiving Chemotherapy  Today you received the following chemotherapy agents: Gemzar and Cisplatin.  To help prevent nausea and vomiting after your treatment, we encourage you to take your nausea medication as prescribed.   If you develop nausea and vomiting that is not controlled by your nausea medication, call the clinic.   BELOW ARE SYMPTOMS THAT SHOULD BE REPORTED IMMEDIATELY:  *FEVER GREATER THAN 100.5 F  *CHILLS WITH OR WITHOUT FEVER  NAUSEA AND VOMITING THAT IS NOT CONTROLLED WITH YOUR NAUSEA MEDICATION  *UNUSUAL SHORTNESS OF BREATH  *UNUSUAL BRUISING OR BLEEDING  TENDERNESS IN MOUTH AND THROAT WITH OR WITHOUT PRESENCE OF ULCERS  *URINARY PROBLEMS  *BOWEL PROBLEMS  UNUSUAL RASH Items with * indicate a potential emergency and should be followed up as soon as possible.  Feel free to call the clinic should you have any questions or concerns. The clinic phone number is [phone].  Please show the [place] at check-in to the Emergency Department and triage nurse.

## 2019-03-15 ENCOUNTER — Telehealth: Payer: Self-pay | Admitting: Nurse Practitioner

## 2019-03-15 LAB — CANCER ANTIGEN 19-9: CA 19-9: 27 U/mL (ref 0–35)

## 2019-03-15 NOTE — Telephone Encounter (Signed)
No los per 7/8. °

## 2019-03-21 ENCOUNTER — Telehealth: Payer: Self-pay

## 2019-03-21 NOTE — Telephone Encounter (Signed)
Scheduled patient for MR abdomen, first available at [facility], 7/22 at 1:00 pm to arrive at 12:30, NPO for 4 hours before.  Left voice message and encouraged him to call back if he has questions.

## 2019-03-22 ENCOUNTER — Telehealth: Payer: Self-pay | Admitting: *Deleted

## 2019-03-22 NOTE — Telephone Encounter (Signed)
Called to obtain pre visit information. No answer. LVM for him to return call.

## 2019-03-25 NOTE — Patient Instructions (Signed)
____________________________________________________________________________________________  Medication Recommendations and Reminders  Applies to: All patients receiving prescriptions (written and/or electronic).  Medication Rules & Regulations: These rules and regulations exist for your safety and that of others. They are not flexible and neither are we. Dismissing or ignoring them will be considered "non-compliance" with medication therapy, resulting in complete and irreversible termination of such therapy. (See document titled "Medication Rules" for more details.) In all conscience, because of safety reasons, we cannot continue providing a therapy where the patient does not follow instructions.  Pharmacy of record:   Definition: This is the pharmacy where your electronic prescriptions will be sent.   We do not endorse any particular pharmacy.  You are not restricted in your choice of pharmacy.  The pharmacy listed in the electronic medical record should be the one where you want electronic prescriptions to be sent.  If you choose to change pharmacy, simply notify our nursing staff of your choice of new pharmacy.  Recommendations:  Keep all of your pain medications in a safe place, under lock and key, even if you live alone.   After you fill your prescription, take 1 week's worth of pills and put them away in a safe place. You should keep a separate, properly labeled bottle for this purpose. The remainder should be kept in the original bottle. Use this as your primary supply, until it runs out. Once it's gone, then you know that you have 1 week's worth of medicine, and it is time to come in for a prescription refill. If you do this correctly, it is unlikely that you will ever run out of medicine.  To make sure that the above recommendation works, it is very important that you make sure your medication refill appointments are scheduled at least 1 week before you run out of medicine. To do  this in an effective manner, make sure that you do not leave the office without scheduling your next medication management appointment. Always ask the nursing staff to show you in your prescription , when your medication will be running out. Then arrange for the receptionist to get you a return appointment, at least 7 days before you run out of medicine. Do not wait until you have 1 or 2 pills left, to come in. This is very poor planning and does not take into consideration that we may need to cancel appointments due to bad weather, sickness, or emergencies affecting our staff.  "Partial Fill": If for any reason your pharmacy does not have enough pills/tablets to completely fill or refill your prescription, do not allow for a "partial fill". You will need a separate prescription to fill the remaining amount, which we will not provide. If the reason for the partial fill is your insurance, you will need to talk to the pharmacist about payment alternatives for the remaining tablets, but again, do not accept a partial fill.  Prescription refills and/or changes in medication(s):   Prescription refills, and/or changes in dose or medication, will be conducted only during scheduled medication management appointments. (Applies to both, written and electronic prescriptions.)  No refills on procedure days. No medication will be changed or started on procedure days. No changes, adjustments, and/or refills will be conducted on a procedure day. Doing so will interfere with the diagnostic portion of the procedure.  No phone refills. No medications will be "called into the pharmacy".  No Fax refills.  No weekend refills.  No Holliday refills.  No after hours refills.  Remember:  Business   hours are:  Monday to Thursday 8:00 AM to 4:00 PM Provider's Schedule: Crystal King, NP - Appointments are:  Medication management: Monday to Thursday 8:00 AM to 4:00 PM Jacquise Rarick, MD - Appointments are:   Medication management: Monday and Wednesday 8:00 AM to 4:00 PM Procedure day: Tuesday and Thursday 7:30 AM to 4:00 PM Bilal Lateef, MD - Appointments are:  Medication management: Tuesday and Thursday 8:00 AM to 4:00 PM Procedure day: Monday and Wednesday 7:30 AM to 4:00 PM (Last update: 11/03/2017) ____________________________________________________________________________________________    

## 2019-03-25 NOTE — Progress Notes (Signed)
Pain Management Virtual Encounter Note - Virtual Visit via Telephone Telehealth (real-time audio visits between healthcare provider and patient).   Patient's Phone No. & Preferred Pharmacy:  [phone] (home); [phone] (mobile); (Preferred) [phone] No e-mail address on record  [id number] - [place], [place] [address] Phone: [phone] Fax: [phone]    Pre-screening note:  Our staff contacted John [name] and offered him an "in person", "face-to-face" appointment versus a telephone encounter. He indicated preferring the telephone encounter, at this time.   Reason for Virtual Visit: COVID-19*  Social distancing based on CDC and AMA recommendations.   I contacted John [name] on 03/26/2019 via telephone.      I clearly identified myself as Gaspar Cola, MD. I verified that I was speaking with the correct person using two identifiers (Name: John [name], and date of birth: October 29, 1955).  Advanced Informed Consent I sought verbal advanced consent from John [name] for virtual visit interactions. I informed John [name] of possible security and privacy concerns, risks, and limitations associated with providing "not-in-person" medical evaluation and management services. I also informed John [name] of the availability of "in-person" appointments. Finally, I informed him that there would be a charge for the virtual visit and that he could be  personally, fully or partially, financially responsible for it. John [name] expressed understanding and agreed to proceed.   Historic Elements   John [name] is a 63 y.o. year old, male patient evaluated today after his last encounter by our practice on 03/22/2019. John [name]  has a past medical history of Abnormal MRI, lumbar spine (2015) (01/21/2016), Anxiety, Back ache (05/06/2014), Cervical pain (05/06/2014), 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[place])  (06/23/2015), COPD (chronic obstructive pulmonary disease) ([place]), De Quervain's tenosynovitis, left (07/07/2017), Depression, Dystonia, Epileptic disorder ([place]) (06/23/2015), Extremity pain (05/06/2014), Febrile seizures ([place]), Foot pain (09/11/2014), Gastric ulcer (06/23/2015), Gout, H/O neoplasm (06/23/2015), Head injury (06/23/2015), Hemorrhoids, Hepatic cirrhosis ([place]) (06/23/2015), Hepatitis B, History of GI bleed, Leukopenia, Liver cancer ([place]) (08/2018), Neurogenic pain (01/21/2016), Nodule of upper lobe of left lung (12/02/2017), Non-small cell cancer of left lung ([place]) (2019), Obstructive sleep apnea (06/23/2015), Osteoarthritis of spine with radiculopathy, lumbar region (06/23/2015), Osteoarthritis of spine with radiculopathy, lumbosacral region (06/23/2015), Peripheral neuropathy (06/23/2015), Peripheral neuropathy ([place]) (48/09/6551), Personal history of tobacco use, presenting hazards to health (01/09/2016), Pulse irregularity, Seizures ([place]), Spinal stenosis, Splenomegaly (05/30/2013), Thrombocytopenia ([place]) (74/82/7078), and Uncomplicated opioid dependence ([place]) (06/23/2015). He also  has a past surgical history that includes surgery for cervical neck fracture; Esophagogastroduodenoscopy (11/2013); Tooth Extraction (Right, 09/28/2016); and Esophagogastroduodenoscopy (egd) with propofol (N/A, 06/23/2018). John [name] has a current medication list which includes the following prescription(s): alprazolam, avatrombopag maleate, cetirizine, folic acid, magnesium, omeprazole, ondansetron, prochlorperazine, baclofen, dulcolax, gabapentin, oxycodone, oxycodone, and oxycodone. He  reports that he quit smoking about 17 months ago. His smoking use included cigarettes. He started smoking about 17 months ago. He has a 17.50 pack-year smoking history. His smokeless tobacco use includes snuff. He reports that he does not drink alcohol or use drugs. John [name] is allergic to codeine; morphine; and duloxetine.   HPI   Today, he is being contacted for medication management.  Pharmacotherapy Assessment  Analgesic:  Oxycodone 2.5 one every 12 hours (5 mg/day of oxycodone) MME/day:7.5 mg/day.   Monitoring: Pharmacotherapy: No side-effects or adverse reactions reported. [place] PMP: PDMP reviewed during this encounter.       Compliance: No problems identified. Effectiveness: Clinically  acceptable. Plan: Refer to "POC".  Pertinent Labs   SAFETY SCREENING Profile Lab Results  Component Value Date   HCVAB <0.1 11/06/2015   Renal Function Lab Results  Component Value Date   BUN 7 (L) 03/14/2019   CREATININE 0.80 03/14/2019   GFRAA >60 03/14/2019   GFRNONAA >60 03/14/2019   Hepatic Function Lab Results  Component Value Date   AST 26 03/14/2019   ALT 13 03/14/2019   ALBUMIN 3.8 03/14/2019   UDS Summary  Date Value Ref Range Status  10/09/2018 FINAL  Final    Comment:    ==================================================================== TOXASSURE SELECT 13 (MW) ==================================================================== Test                             Result       Flag       Units Drug Present and Declared for Prescription Verification   Oxycodone                      754          EXPECTED   ng/mg creat   Oxymorphone                    232          EXPECTED   ng/mg creat   Noroxycodone                   986          EXPECTED   ng/mg creat    Sources of oxycodone include scheduled prescription medications.    Oxymorphone and noroxycodone are expected metabolites of    oxycodone. Oxymorphone is also available as a scheduled    prescription medication. Drug Absent but Declared for Prescription Verification   Alprazolam                     Not Detected UNEXPECTED ng/mg creat ==================================================================== Test                      Result    Flag   Units      Ref Range   Creatinine              37               mg/dL       >=20 ==================================================================== Declared Medications:  The flagging and interpretation on this report are based on the  following declared medications.  Unexpected results may arise from  inaccuracies in the declared medications.  **Note: The testing scope of this panel includes these medications:  Alprazolam (Xanax)  Oxycodone  **Note: The testing scope of this panel does not include following  reported medications:  Baclofen (Lioresal)  Cetirizine (Zyrtec)  Docusate (Dulcolax)  Folic acid (Folvite)  Gabapentin  Magnesium Oxide  Omeprazole (Prilosec) ==================================================================== For clinical consultation, please call [phone]. ====================================================================    Note: Above Lab results reviewed.  Recent imaging  MR Abdomen W Wo Contrast CLINICAL DATA:  Abdominal pain. Status post liver biopsy 3 weeks ago.  EXAM: MRI ABDOMEN WITHOUT AND WITH CONTRAST  TECHNIQUE: Multiplanar multisequence MR imaging of the abdomen was performed both before and after the administration of intravenous contrast.  CONTRAST:  9 cc of Gadavist  COMPARISON:  11/09/2018  FINDINGS: Lower chest: No acute abnormality identified.  Hepatobiliary: Again seen are morphologic features of the liver compatible with cirrhosis.  Multiple peripherally enhancing liver lesions are again identified.  -Index lesion within segment 4 a measures 7.0 x 4.0 cm, image 30/15. Previously 8.2 x 4.4 cm. 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  -The previous index lesion within segment 2 measuring 3.6 x 3.3 cm (image 17/18 of the previous study) is no longer measurable.  Index lesion within the lateral aspect segment 2 measures 1.3 by 1.1 cm, image 29/18. Previously 1.6 x 1.6 cm.  No new or  progressive disease identified.  Pancreas: No mass, inflammatory changes, or other parenchymal abnormality identified.  Spleen: The spleen is enlarged measuring 14.4 cm in length. Previously 13.5 cm.  Adrenals/Urinary Tract: Normal appearance of the adrenal glands. No kidney mass or hydronephrosis.  Stomach/Bowel: Visualized portions within the abdomen are unremarkable.  Vascular/Lymphatic: Aortic atherosclerosis. No aneurysm. Gastric, splenic and esophageal varices noted. Recanalization of the umbilical vein. The main portal vein remains patent. 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  Upper abdominal adenopathy:  -porta hepatic lymph node measures 3.5 cm, image 36/15. Previously 1.6 cm.  Porto caval lymph node measures 1.7 cm, image 51/15. Previously 1 cm.  -new left retroperitoneal node measures 1.6 cm, image 53/15.  Other: Trace abdominal ascites.  Musculoskeletal: No suspicious bone lesions identified.  IMPRESSION: 1. No complications status post liver biopsy to explain patient's abdominal pain. 2. Multifocal liver lesions are again identified. When compared with previous exam these appear mildly improved in the interval. 3. Upper abdominal adenopathy has increased in the interval. 4. 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 5. Cirrhosis with stigmata of portal venous hypertension including varices and splenomegaly.  Electronically Signed   By: Kerby Moors M.D.   On: 01/16/2019 08:51  Assessment  The primary encounter diagnosis was Chronic pain syndrome. Diagnoses of Chronic low back pain (Primary Area of Pain) (Bilateral) (L>R), Chronic lower extremity pain (Secondary area of Pain) (Bilateral) (L>R), Chronic upper extremity pain (Third area of Pain) (Bilateral) (R>L), Musculoskeletal pain, Neurogenic pain, and Opioid-induced  constipation (OIC) were also pertinent to this visit.  Plan of Care  I have discontinued John [name]. Kiene's bisacodyl. I have also changed his oxyCODONE. Additionally, I am having him start on oxyCODONE and oxyCODONE. Lastly, I am having him maintain his folic acid, ALPRAZolam, cetirizine, prochlorperazine, ondansetron, omeprazole, Avatrombopag Maleate, Magnesium, baclofen, gabapentin, and Dulcolax.  Pharmacotherapy (Medications Ordered): Meds ordered this encounter  Medications  . oxyCODONE (OXY IR/ROXICODONE) 5 MG immediate release tablet    Sig: Take 0.5 tablets (2.5 mg total) by mouth 2 (two) times daily. Must last 30 days    Dispense:  30 tablet    Refill:  0    Chronic Pain: STOP Act (Not applicable) Fill 1 day early if closed on refill date. Do not fill until: 05/03/2019. To last until: 06/02/2019. Avoid benzodiazepines within 8 hours of opioids  . oxyCODONE (OXY IR/ROXICODONE) 5 MG immediate release tablet    Sig: Take 0.5 tablets (2.5 mg total) by mouth 2 (two) times daily. Must last 30 days    Dispense:  30 tablet    Refill:  0    Chronic Pain: STOP Act (Not applicable) Fill 1 day early if closed on refill date. Do not fill until: 06/02/2019. To last until: 07/02/2019. Avoid benzodiazepines within 8 hours of opioids  . oxyCODONE (OXY IR/ROXICODONE) 5  MG immediate release tablet    Sig: Take 0.5 tablets (2.5 mg total) by mouth 2 (two) times daily. Must last 30 days    Dispense:  30 tablet    Refill:  0    Chronic Pain: STOP Act (Not applicable) Fill 1 day early if closed on refill date. Do not fill until: 07/02/2019. To last until: 08/01/2019. Avoid benzodiazepines within 8 hours of opioids  . baclofen (LIORESAL) 10 MG tablet    Sig: Take 1 tablet (10 mg total) by mouth 3 (three) times daily.    Dispense:  270 tablet    Refill:  0    Fill one day early if pharmacy is closed on scheduled refill date. May substitute for generic if available.  . gabapentin (NEURONTIN) 600 MG tablet     Sig: Take 1 tablet (600 mg total) by mouth 3 (three) times daily.    Dispense:  270 tablet    Refill:  0    Fill one day early if pharmacy is closed on scheduled refill date. May substitute for generic if available.  . bisacodyl (DULCOLAX) 5 MG EC tablet    Sig: Take 1 tablet (5 mg total) by mouth daily as needed for moderate constipation.    Dispense:  90 tablet    Refill:  0    Fill one day early if pharmacy is closed on scheduled refill date. May substitute for generic if available.   Orders:  No orders of the defined types were placed in this encounter.  Follow-up plan:   Return in about 4 months (around 08/01/2019) for (VV), E/M (MM).      Interventional therapies:  Considering: Diagnostic left L5-S1 LESI  Diagnostic bilateral L4-5 TFESI  Diagnostic bilateral lumbar facet block  Diagnostic right-sided CESI    Palliative PRN treatment(s): Palliative left De Quervain's tendon injection     Recent Visits Date Type Provider Dept  01/04/19 Office Visit Vevelyn Francois, NP [facility] recent visits within past 90 days and meeting all other requirements   Today's Visits Date Type Provider Dept  03/26/19 Office Visit Milinda Pointer, MD [facility]  Showing today's visits and meeting all other requirements   Future Appointments Date Type Provider Dept  05/30/19 Appointment Milinda Pointer, MD [facility]  Showing future appointments within next 90 days and meeting all other requirements   I discussed the assessment and treatment plan with the patient. The patient was provided an opportunity to ask questions and all were answered. The patient agreed with the plan and demonstrated an understanding of the instructions.  Patient advised to call back or seek an in-person evaluation if the symptoms or condition worsens.  Total duration of non-face-to-face encounter: 12 minutes.  Note by: Gaspar Cola, MD Date: 03/26/2019;  Time: 1:44 PM  Note: This dictation was prepared with Dragon dictation. Any transcriptional errors that may result from this process are unintentional.  Disclaimer:  * Given the special circumstances of the COVID-19 pandemic, the federal government has announced that the Office for Civil Rights (OCR) will exercise its enforcement discretion and will not impose penalties on physicians using telehealth in the event of noncompliance with regulatory requirements under the [place] (HIPAA) in connection with the good faith provision of telehealth during the PPJKD-32 national public health emergency. ([place])

## 2019-03-26 ENCOUNTER — Ambulatory Visit: Payer: Medicare Other | Attending: Pain Medicine | Admitting: Pain Medicine

## 2019-03-26 ENCOUNTER — Other Ambulatory Visit: Payer: Self-pay

## 2019-03-26 ENCOUNTER — Encounter: Payer: Self-pay | Admitting: Pain Medicine

## 2019-03-26 DIAGNOSIS — K5903 Drug induced constipation: Secondary | ICD-10-CM

## 2019-03-26 DIAGNOSIS — M79604 Pain in right leg: Secondary | ICD-10-CM

## 2019-03-26 DIAGNOSIS — G894 Chronic pain syndrome: Secondary | ICD-10-CM

## 2019-03-26 DIAGNOSIS — M79601 Pain in right arm: Secondary | ICD-10-CM | POA: Diagnosis not present

## 2019-03-26 DIAGNOSIS — M5441 Lumbago with sciatica, right side: Secondary | ICD-10-CM

## 2019-03-26 DIAGNOSIS — M5442 Lumbago with sciatica, left side: Secondary | ICD-10-CM | POA: Diagnosis not present

## 2019-03-26 DIAGNOSIS — M7918 Myalgia, other site: Secondary | ICD-10-CM | POA: Diagnosis not present

## 2019-03-26 DIAGNOSIS — G8929 Other chronic pain: Secondary | ICD-10-CM

## 2019-03-26 DIAGNOSIS — M792 Neuralgia and neuritis, unspecified: Secondary | ICD-10-CM

## 2019-03-26 DIAGNOSIS — T402X5A Adverse effect of other opioids, initial encounter: Secondary | ICD-10-CM

## 2019-03-26 DIAGNOSIS — M79605 Pain in left leg: Secondary | ICD-10-CM

## 2019-03-26 MED ORDER — DULCOLAX 5 MG PO TBEC: 5.0000 mg | DELAYED_RELEASE_TABLET | Freq: Every day | ORAL | 0 refills | Status: DC | PRN

## 2019-03-26 MED ORDER — GABAPENTIN 600 MG PO TABS: 600.0000 mg | ORAL_TABLET | Freq: Three times a day (TID) | ORAL | 0 refills | Status: DC

## 2019-03-26 MED ORDER — OXYCODONE HCL 5 MG PO TABS: 2.5000 mg | ORAL_TABLET | Freq: Two times a day (BID) | ORAL | 0 refills | Status: DC

## 2019-03-26 MED ORDER — BACLOFEN 10 MG PO TABS: 10.0000 mg | ORAL_TABLET | Freq: Three times a day (TID) | ORAL | 0 refills | Status: DC

## 2019-03-27 ENCOUNTER — Telehealth: Payer: Self-pay | Admitting: *Deleted

## 2019-03-27 ENCOUNTER — Other Ambulatory Visit: Payer: Self-pay | Admitting: *Deleted

## 2019-03-27 DIAGNOSIS — D696 Thrombocytopenia, unspecified: Secondary | ICD-10-CM

## 2019-03-27 MED ORDER — AVATROMBOPAG MALEATE 20 MG PO TABS: 40.0000 mg | ORAL_TABLET | Freq: Every day | ORAL | 2 refills | Status: DC

## 2019-03-27 NOTE — Telephone Encounter (Signed)
Yes I did intend for him to take TID. His dose is 2-3 per day depending on platelet level. He said he had enough to take TID so I didn't refill at the time.  Regan Rakers

## 2019-03-27 NOTE — Telephone Encounter (Signed)
Received vm call from [facility] stating that pt called for refill on his Promacta & that he was instructed to take 3/d.  They are asking for new script so they can give pt a months supply.  Reviewed chart & saw note from Cira Rue NP 03/15/19 OV to cont Doptelet three times daily per change one week ago.  Unable to find order for increased dose.  Will f/u with Lacie to make sure this is what pt needs.

## 2019-03-27 NOTE — Telephone Encounter (Signed)
New script sent to [facility]

## 2019-03-28 ENCOUNTER — Other Ambulatory Visit: Payer: Self-pay

## 2019-03-28 ENCOUNTER — Ambulatory Visit
Admission: RE | Admit: 2019-03-28 | Discharge: 2019-03-28 | Disposition: A | Discharge status: Home / Self Care | Payer: Medicare Other | Source: Ambulatory Visit | Attending: Nurse Practitioner | Admitting: Nurse Practitioner

## 2019-03-28 DIAGNOSIS — C221 Intrahepatic bile duct carcinoma: Secondary | ICD-10-CM | POA: Insufficient documentation

## 2019-03-28 MED ORDER — GADOBUTROL 1 MMOL/ML IV SOLN
9.0000 mL | Freq: Once | INTRAVENOUS | Status: AC | PRN
  Administered 2019-03-28: 9 mL via INTRAVENOUS

## 2019-03-28 NOTE — Progress Notes (Addendum)
[place]   [phone] [phone]   Clinic Follow up Note   Patient Care Team: John Athens, MD as PCP - General (Internal Medicine) John Silvas, MD (Gastroenterology) 03/29/2019  CHIEF COMPLAINT: f/u adenocarcinoma in liver   SUMMARY OF ONCOLOGIC HISTORY: Oncology History  Cholangiocarcinoma ([place])  05/05/2018 Initial Biopsy   DIAGNOSIS: 05/05/18 A. LIVER MASS; ULTRASOUND-GUIDED BIOPSY:  - CYTOKERATIN 7 POSITIVE ADENOCARCINOMA WITH ABUNDANT NECROSIS, SEE  COMMENT.  Diagnosis 06/28/18 Consult- Comprehensive, [phone] Liver Bx - POORLY DIFFERENTIATED CARCINOMA INVOLVING LIVER PARENCHYMA. SEE NOTE. Diagnosis Note Most of the tumor shows a abortive glad formation, consistent with an adenocarcinoma. However, in some areas, the tumor cells show increased cytoplasm with a nested architecture, giving them a hepatoid appearance. Immunohistochemical stains performed at the outside institution show that the tumor cells are positive for CK7 and negative for TTF-1, Napsin-A, CDX-2, Hepar-1 and CD34. Immunostains performed at [facility] show that glypican-3 has patchy positive staining in the hepatoid-appearing tumor cells. Arginase and AFP stains show very focal, weak staining. Immunostain of pCEA shows focal canalicular-like staining pattern in the tumor cells. The findings are consistent with a cholangiocarcinoma but the the tumor histomorphology and staining pattern are suggestive of a hepatocellular carcinoma component, raising possibility of a combined hepatocellular cholangiocarcinoma. Additional biopsies or re-evaluation on the resection specimen is suggested for confirmation.   06/02/2018 PET scan   PET 06/02/18  IMPRESSION: 1. Solitary hypermetabolic lesion in the central LEFT hepatic lobe consistent with malignancy. 2. No metabolic activity remaining in LEFT apical pulmonary nodule which is reduced in size. 3. No evidence of metastatic  adenopathy on skull base to thigh FDG PET scan. 4. Cirrhotic liver.  Cholelithiasis 5.  Aortic Atherosclerosis (ICD10-I70.0).   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 Jearld [name] at [facility]  Estimated dose to liver target: 117 Gy Estimated dose to lung: 5.56 Gy Impression: Successful radioembolization with administration of intravascular yttrium-90 Theraspheres as described above.   10/18/2018 Imaging   US Abdomen 10/18/18 IMPRESSION: 1. Cholelithiasis without evidence of acute cholecystitis. 2. 2.2 cm left hepatic lobe mass consistent with known malignancy. 3. Cirrhosis and splenomegaly.   11/09/2018 Imaging   MRI Abdomen 11/09/18 at [facility]  Impression: Marked increased size of the mass centered in segment 4A with new satellite lesions seen in segments 2 and 3, likely representing multifocal mass-forming cholangiocarcinoma. New tumor invasion seen within the right anterior portal vein and middle hepatic vein to the level of the IVC, which is a pattern more commonly seen with hepatocellular carcinoma.   11/25/2018 Miscellaneous   Foundation One 3/21.2020  MSI -Stable - cannot be determined  Tumor Mutation Burden - cannot be determined  TERT promoter 124C>T TP53 [id number]   11/28/2018 Imaging   CT Chest 11/28/18  IMPRESSION: 1. Further improvement in left apical nodule, now nearly resolved. Mild surrounding radiation changes. 2. No new or enlarging pulmonary nodules. 3. Treated lesion in the left hepatic lobe is not optimally visualized on this noncontrast study, although appears slightly larger. 4. Progressive adenopathy in the upper abdomen. 5. Additional incidental findings are stable, including cirrhosis, splenomegaly, aortic valvular calcifications, Aortic Atherosclerosis (ICD10-I70.0) and Emphysema (ICD10-J43.9).   12/25/2018 Cancer Staging   Staging form: Intrahepatic Bile Duct, AJCC  8th Edition - Clinical stage from 12/25/2018: Stage II (cT2(m), cN0, cM0) - Signed by John Merle, MD on 12/27/2018   01/24/2019 -  Chemotherapy   First line Cisplatin  and Gemcitabine every 2 weeks starting 01/24/19  -He did have reaction to IV Emend and dexa with SOB, chest pain and flushing, will d/c and will switch dexa to Solu-Medrol on cycle 2.    03/28/2019 Imaging   MRI IMPRESSION: 1. Slight interval increase in size of the large necrotic central hepatic neoplasm. 2. Stable celiac axis lymph node and slightly enlarged left para-aortic, right hepatoduodenal ligament and epicardial lymph nodes. 3. 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 4. Stable splenomegaly.     CURRENT THERAPY: ChemoGemcitabine and Cisplatinevery 2 weeks starting 01/24/19  INTERVAL HISTORY: John [name] returns for f/u as scheduled. He completed cycle 4 gemcitabine and cisplatin on 03/14/19. He underwent restaging MRI on 03/28/19. Treatment went well. He has fatigue for 3-4 days after chemo. Appetite is normal. Mild intermittent nausea is controlled with anti-emetics. He was nauseated after MRI [name] yesterday. No vomiting. Takes laxative PRN for constipation. No blood in stool. Has mild low abdominal pain before starting urination, no burning or hematuria. Has been going on a few weeks.  Mild dyspnea on exertion is stable. Denies cough, chest pain. Denies fever, chills. Back pain and neuropathy are stable, controlled.    MEDICAL HISTORY:  Past Medical History:  Diagnosis Date   Abnormal MRI, lumbar spine (2015) 01/21/2016   FINDINGS:  L3-4: Tiny right foraminal and extra foraminal disc bulge adjacent to but not compressing the L3 nerve lateral to the neural foramen. L4-5: There is a small broad-based disc bulge. There is also hypertrophy of the ligamentum flavum and facet joints, left more than right creating moderately severe spinal stenosis and bilateral lateral recess  stenosis, left greater than right. This appe   Anxiety    Back ache 05/06/2014   Cervical pain 05/06/2014   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[place]) 06/23/2015   COPD (chronic obstructive pulmonary disease) ([place])    De Quervain's tenosynovitis, left 07/07/2017   Depression    Dystonia    Epileptic disorder ([place]) 06/23/2015   Extremity pain 05/06/2014   Febrile seizures ([place])    child   Foot pain 09/11/2014   Gastric ulcer 06/23/2015   Gout    H/O neoplasm 06/23/2015   Head injury 06/23/2015   Hemorrhoids    Hepatic cirrhosis ([place]) 06/23/2015   Hepatitis B    History of GI bleed    Leukopenia    Liver cancer ([place]) 08/2018   Neurogenic pain 01/21/2016   Nodule of upper lobe of left lung 12/02/2017   Non-small cell cancer of left lung ([place]) 2019   Rad tx's.    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   Osteoarthritis of spine with radiculopathy, lumbosacral region 06/23/2015   Peripheral neuropathy 06/23/2015   Peripheral neuropathy ([place]) 54/56/2563   Personal history of tobacco use, presenting hazards to health 01/09/2016   Pulse irregularity    Seizures ([facility])    Spinal stenosis    Splenomegaly 05/30/2013   Thrombocytopenia ([place]) 89/37/3428   Uncomplicated opioid dependence ([place]) 06/23/2015    SURGICAL HISTORY: Past Surgical History:  Procedure Laterality Date   ESOPHAGOGASTRODUODENOSCOPY  11/2013   ESOPHAGOGASTRODUODENOSCOPY (EGD) WITH PROPOFOL N/A 06/23/2018   Procedure: ESOPHAGOGASTRODUODENOSCOPY (EGD) WITH PROPOFOL;  Surgeon: John Silvas, MD;  Location[facility] ENDOSCOPY;  Service: Endoscopy;  Laterality: N/A;   surgery for cervical neck fracture     TOOTH EXTRACTION Right 09/28/2016    I have reviewed the social history and family  history with the patient and they are unchanged from previous note.  ALLERGIES:  is allergic to codeine; morphine; and  duloxetine.  MEDICATIONS:  Current Outpatient Medications  Medication Sig Dispense Refill   ALPRAZolam (XANAX) 0.25 MG tablet Take 0.25 mg by mouth at bedtime as needed for anxiety or sleep.      Avatrombopag Maleate 20 MG TABS Take 40 mg by mouth daily. Take 3 tabs = 60 mg   daily 60 tablet 2   [START ON 04/01/2019] baclofen (LIORESAL) 10 MG tablet Take 1 tablet (10 mg total) by mouth 3 (three) times daily. 270 tablet 0   [START ON 04/04/2019] bisacodyl (DULCOLAX) 5 MG EC tablet Take 1 tablet (5 mg total) by mouth daily as needed for moderate constipation. 90 tablet 0   cetirizine (ZYRTEC) 10 MG tablet Take 10 mg by mouth daily.  3   folic acid (FOLVITE) 1 MG tablet Take 1 mg by mouth daily.      [START ON 04/04/2019] gabapentin (NEURONTIN) 600 MG tablet Take 1 tablet (600 mg total) by mouth 3 (three) times daily. 270 tablet 0   Magnesium 500 MG CAPS Take 500 mg by mouth 2 (two) times a day.     omeprazole (PRILOSEC) 20 MG capsule Take 1 capsule (20 mg total) by mouth daily. 90 capsule 1   ondansetron (ZOFRAN) 8 MG tablet Take 1 tablet (8 mg total) by mouth every 8 (eight) hours as needed for nausea or vomiting. 20 tablet 0   [START ON 05/03/2019] oxyCODONE (OXY IR/ROXICODONE) 5 MG immediate release tablet Take 0.5 tablets (2.5 mg total) by mouth 2 (two) times daily. Must last 30 days 30 tablet 0   [START ON 06/02/2019] oxyCODONE (OXY IR/ROXICODONE) 5 MG immediate release tablet Take 0.5 tablets (2.5 mg total) by mouth 2 (two) times daily. Must last 30 days 30 tablet 0   [START ON 07/02/2019] oxyCODONE (OXY IR/ROXICODONE) 5 MG immediate release tablet Take 0.5 tablets (2.5 mg total) by mouth 2 (two) times daily. Must last 30 days 30 tablet 0   prochlorperazine (COMPAZINE) 10 MG tablet Take 1 tablet (10 mg total) by mouth every 6 (six) hours as needed for nausea or vomiting. 30 tablet 0   No current facility-administered medications for this visit.     PHYSICAL EXAMINATION: ECOG  PERFORMANCE STATUS: 1 - Symptomatic but completely ambulatory  Vitals:   03/29/19 0907 03/29/19 0910  BP: (!) 141/51 (!) 121/58  Pulse: (!) 58   Resp: 17   Temp: 97.8 F (36.6 C)   SpO2: 98%    Filed Weights   03/29/19 0907  Weight: 208 lb 14.4 oz (94.8 kg)    GENERAL:alert, no distress and comfortable SKIN: no obvious rash  EYES: sclera clear LUNGS: respirations even and unlabored  ABDOMEN:abdomen soft, round; hepatomegaly with mild tenderness to palpation.  Musculoskeletal:no cyanosis of digits, no edema  NEURO: alert & oriented x 3 with fluent speech, slight sensory deficit over fingertips per tuning fork exam Limited exam for covid19 outbreak   LABORATORY DATA:  I have reviewed the data as listed CBC Latest Ref Rng & Units 03/29/2019 03/14/2019 03/01/2019  WBC 4.0 - 10.5 K/uL 3.0(L) 2.7(L) 2.7(L)  Hemoglobin 13.0 - 17.0 g/dL 9.5(L) 9.9(L) 10.8(L)  Hematocrit 39.0 - 52.0 % 30.7(L) 31.4(L) 34.1(L)  Platelets 150 - 400 K/uL 126(L) 82(L) 130(L)     CMP Latest Ref Rng & Units 03/29/2019 03/14/2019 03/01/2019  Glucose 70 - 99 mg/dL 97 99 131(H)  BUN 8 -  23 mg/dL 7(L) 7(L) 7(L)  Creatinine 0.61 - 1.24 mg/dL 0.79 0.80 0.84  Sodium 135 - 145 mmol/L 139 139 140  Potassium 3.5 - 5.1 mmol/L 4.4 4.0 3.7  Chloride 98 - 111 mmol/L 104 105 105  CO2 22 - 32 mmol/L '28 25 24  ' Calcium 8.9 - 10.3 mg/dL 9.0 8.9 8.9  Total Protein 6.5 - 8.1 g/dL 7.6 7.7 7.7  Total Bilirubin 0.3 - 1.2 mg/dL 0.6 0.7 0.6  Alkaline Phos 38 - 126 U/L 90 89 83  AST 15 - 41 U/L '24 26 28  ' ALT 0 - 44 U/L '11 13 18      ' RADIOGRAPHIC STUDIES: I have personally reviewed the radiological images as listed and agreed with the findings in the report. John Abdomen W Wo [name]  Result Date: 03/29/2019 CLINICAL DATA:  Follow-up cholangiocarcinoma. History of Y 90 procedure. Patient currently undergoing chemotherapy. EXAM: MRI ABDOMEN WITHOUT AND WITH [name] TECHNIQUE: Multiplanar multisequence John imaging of the abdomen  was performed both before and after the administration of intravenous [name]. [name]:  9 cc Gadavist COMPARISON:  MRI 01/15/2019 and 04/19/2018 FINDINGS: Lower chest: The lung bases are clear of an acute process. No worrisome pulmonary lesions. No pericardial effusion. Enlarged epicardial lymph node is again demonstrated. This measures 2.0 cm and previously measured 1.3 cm. Hepatobiliary: The large central necrotic hepatic mass measures 8.1 x 5.7 x 7.0 cm. It previously measured 7.0 x 5.3 x 6.4 cm. Adjacent necrotic adenopathy in the celiac axis measures a maximum of 3.5 cm and previously measured 3.5 cm. Necrotic appearing hepatic duodenum ligament lymph node measures 2.1 cm and previously measured 1.8 cm. Left para-aortic lymph node measures a maximum of 19.5 mm and previously measured 16 mm. A few simple nonenhancing cysts are stable. 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 The gallbladder is contracted and thick walled. Pancreas:  No mass, inflammation or ductal dilatation. Spleen:  Stable splenomegaly with maximum measurement of 17 cm. Adrenals/Urinary Tract: The adrenal glands and kidneys are unremarkable and stable. Stomach/Bowel: The stomach, duodenum, visualized small bowel loops and visualized colon are grossly normal. Vascular/Lymphatic: Stable atherosclerotic changes involving the aorta but no aneurysm or dissection. The branch vessels are patent. The major venous structures are patent except for the hepatic veins as discussed above. Other:  No abdominal ascites. Musculoskeletal: No significant bony findings. IMPRESSION: 1. Slight interval increase in size of the large necrotic central hepatic neoplasm. 2. Stable celiac axis lymph node and slightly enlarged left para-aortic, right hepatoduodenal ligament and epicardial lymph nodes. 3. 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 4.  Stable splenomegaly. Electronically Signed   By: Marijo Sanes M.D.   On: 03/29/2019 11:15     ASSESSMENT & PLAN: John [name] a 63 y.o.malewith   1.Poorly differentiated adenocarcinoma in liver,probablecholangiocarcinoma, unresectable -He was diagnosed in 04/2018. His liver biopsy showed poorly differentiated adenocarcinoma, CK7 positive, other markers were negative. Upper endoscopy was negative for malignancy.PET scan otherwise negative for other primary tumor. -His biopsy was evaluated by our pathologistDr. Vic Ripper here, who feels this ismost consistent with cholangiocarcinoma, with possible component of hepatocellular carcinoma. -CancertypeIDtestshowed 46% chance ofpancreaticobiliary primary -Patient did have a 1 cm hypermetabolic lung lesion in early 2019, which was treated with SBRT.No biopsy was obtained.Metastatic lung cancer to liver is also a possibility,although the immunostain was not typical. -He was seen by surgeon Dr. Zenia Resides at [facility]  in 07/2018 who did not recommend surgery butreferredhim to IR for target liver therapy. -He underwentY90 radio embolization on12/19/19with Dr. Jearld [name] at [facility].  -GIven his unresectable disease, Dr. Verline Lema him onfirst linechemocisplatin and gemcitabineon 5/20/20with dose reduction.Given COVID-19and his thrombocytopenia,he getschemo every 2 weeks.He tolerated first week well with mild nausea, controlled constipation. -He did have reaction to IV Emendand dexawith SOB, chest pain and flushing; from cycle 2 will get solu-medrol and aloxi -Cycle 2 delayed for neutropenia ANC 1.1 and thrombocytopenia PLT 44K. Instructed to increase doptelet to 40 mg daily -s/p cycle 3 on 03/01/19  -s/p cycle 4 on 03/14/19   2.Hepatitis B and liver cirrhosiswithout varices on EGD 06/2018  -per labs 06/2018: hep B surface antibody - reactive; hep B surface antigen - negative; Hep B core antibody - detected  -He did not have  treatment for hep B -previously instructed to f/u with GI Dr. Tiffany Kocher  3. 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 -beganDoptelet 68m daily to maintain his PLT on 02/01/19. -platelet count responded to 40 mg daily, then began to decrease; last week was instructed to take 1 tab TID (60 mg)   4. Left apical lungcancer stage IA,s/p SBRT, COPD -PET scanon 11/24/2017 revealed a hypermetabolic 1.0 cm left apical pulmonary nodulefavoring malignancy. Assuming non-small cell lung cancer this represents T1a N0 M0 disease (stage IA).Biopsy was not obtained -Underwent SBRT 01/05/18-02/01/18 -CT chest from 11/28/18 shows good improvement and no new nodules.this was previously reviewed  5. Social Support  -He lives by himself.Has 2 brothers but 1 does not live very close. -has utilized our tBuilding surveyor 6. Hemorrhoids -denies bleeding -takes laxative1-2x daily forconstipation  Dispo: John. DBossiappears well. He completed 4 cycles gemcitabine and cisplatin. He tolerates well with mild fatigue and intermittent nausea. Labs reviewed, PLT improved to 126K. CBC and CMP otherwise stable. Mg normal. He was seen with Dr. FBurr Medicowho reviewed restaging MRI which shows mild progression in the liver, less than 10%. Overall stable disease. Dr. FBurr Medicorecommends to continue treatment. Patient requests chemo break today. He will return in 2 weeks for follow up and treatment. He is considering PAC placement, will let uKoreaknow. He will decrease doptelet to 40 mg daily. For painful urination UA shows bacteriuria, culture is pending. Will f/u.    Orders Placed This Encounter  Procedures   Urine Culture    Standing Status:   Future    Number of Occurrences:   1    Standing Expiration Date:   03/28/2020   Urinalysis, Complete w Microscopic    Standing Status:   Future    Number of Occurrences:   1     Standing Expiration Date:   03/28/2020   All questions were answered. The patient knows to call the clinic with any problems, questions or concerns. No barriers to learning was detected.     LAlla Feeling NP 03/29/19   Addendum  I have seen the patient, examined him. I agree with the assessment and and plan and have edited the notes.   John DVenhuizenis clinically stable, tolerating chemo moderately well. I personally reviewed his restaging liver MRI and discussed with radiologist.  His cholangiocarcinoma and abdominal dominant liver lesion is slightly in creased in size, but still in SD range. I recommend him to continue current chemo cisplatin and gemcitabine, with a close f/u scan in 2-3 months. He was not very happy about the scan results, but  voiced good understanding, and finally agreed to continue chemo after lengthy discussion.  He wants to take a chemo break, for cancer treatment today, return in 2 weeks.  We also discussed port placement due to his poor peripheral IV access, he will think about it, and he may agree a port after next treatment. Lab reviewed, will continue Doptelet at 71m daily. He had many questions and I answered all the best I can.  At the end of the conversation, he voiced good understanding and was in agreement with the plan.  YTruitt [name] 03/29/2019

## 2019-03-29 ENCOUNTER — Other Ambulatory Visit: Payer: Self-pay

## 2019-03-29 ENCOUNTER — Encounter: Payer: Self-pay | Admitting: Nurse Practitioner

## 2019-03-29 ENCOUNTER — Inpatient Hospital Stay: Payer: Medicare Other

## 2019-03-29 ENCOUNTER — Inpatient Hospital Stay (HOSPITAL_BASED_OUTPATIENT_CLINIC_OR_DEPARTMENT_OTHER): Payer: Medicare Other | Admitting: Nurse Practitioner

## 2019-03-29 VITALS — BP 121/58 | HR 58 | Temp 97.8°F | Resp 17 | Ht 72.0 in | Wt 208.9 lb

## 2019-03-29 DIAGNOSIS — K5900 Constipation, unspecified: Secondary | ICD-10-CM | POA: Diagnosis not present

## 2019-03-29 DIAGNOSIS — Z79899 Other long term (current) drug therapy: Secondary | ICD-10-CM | POA: Diagnosis not present

## 2019-03-29 DIAGNOSIS — C221 Intrahepatic bile duct carcinoma: Secondary | ICD-10-CM

## 2019-03-29 DIAGNOSIS — K649 Unspecified hemorrhoids: Secondary | ICD-10-CM | POA: Diagnosis not present

## 2019-03-29 DIAGNOSIS — D6959 Other secondary thrombocytopenia: Secondary | ICD-10-CM | POA: Diagnosis not present

## 2019-03-29 DIAGNOSIS — R309 Painful micturition, unspecified: Secondary | ICD-10-CM

## 2019-03-29 DIAGNOSIS — C3492 Malignant neoplasm of unspecified part of left bronchus or lung: Secondary | ICD-10-CM | POA: Diagnosis not present

## 2019-03-29 DIAGNOSIS — Z923 Personal history of irradiation: Secondary | ICD-10-CM | POA: Diagnosis not present

## 2019-03-29 DIAGNOSIS — C229 Malignant neoplasm of liver, not specified as primary or secondary: Secondary | ICD-10-CM

## 2019-03-29 DIAGNOSIS — K7460 Unspecified cirrhosis of liver: Secondary | ICD-10-CM

## 2019-03-29 DIAGNOSIS — Z5111 Encounter for antineoplastic chemotherapy: Secondary | ICD-10-CM | POA: Diagnosis not present

## 2019-03-29 LAB — URINALYSIS, COMPLETE (UACMP) WITH MICROSCOPIC
Bilirubin Urine: NEGATIVE
Glucose, UA: NEGATIVE mg/dL
Hgb urine dipstick: NEGATIVE
Ketones, ur: NEGATIVE mg/dL
Leukocytes,Ua: NEGATIVE
Nitrite: NEGATIVE
Protein, ur: NEGATIVE mg/dL
Specific Gravity, Urine: 1.005 (ref 1.005–1.030)
pH: 7 (ref 5.0–8.0)

## 2019-03-29 LAB — CBC WITH DIFFERENTIAL (CANCER CENTER ONLY)
Abs Immature Granulocytes: 0 10*3/uL (ref 0.00–0.07)
Basophils Absolute: 0 10*3/uL (ref 0.0–0.1)
Basophils Relative: 0 %
Eosinophils Absolute: 0.1 10*3/uL (ref 0.0–0.5)
Eosinophils Relative: 2 %
HCT: 30.7 % — ABNORMAL LOW (ref 39.0–52.0)
Hemoglobin: 9.5 g/dL — ABNORMAL LOW (ref 13.0–17.0)
Immature Granulocytes: 0 %
Lymphocytes Relative: 10 %
Lymphs Abs: 0.3 10*3/uL — ABNORMAL LOW (ref 0.7–4.0)
MCH: 29.9 pg (ref 26.0–34.0)
MCHC: 30.9 g/dL (ref 30.0–36.0)
MCV: 96.5 fL (ref 80.0–100.0)
Monocytes Absolute: 0.4 10*3/uL (ref 0.1–1.0)
Monocytes Relative: 14 %
Neutro Abs: 2.3 10*3/uL (ref 1.7–7.7)
Neutrophils Relative %: 74 %
Platelet Count: 126 10*3/uL — ABNORMAL LOW (ref 150–400)
RBC: 3.18 MIL/uL — ABNORMAL LOW (ref 4.22–5.81)
RDW: 19.3 % — ABNORMAL HIGH (ref 11.5–15.5)
WBC Count: 3 10*3/uL — ABNORMAL LOW (ref 4.0–10.5)
nRBC: 0 % (ref 0.0–0.2)

## 2019-03-29 LAB — CMP (CANCER CENTER ONLY)
ALT: 11 U/L (ref 0–44)
AST: 24 U/L (ref 15–41)
Albumin: 3.7 g/dL (ref 3.5–5.0)
Alkaline Phosphatase: 90 U/L (ref 38–126)
Anion gap: 7 (ref 5–15)
BUN: 7 mg/dL — ABNORMAL LOW (ref 8–23)
CO2: 28 mmol/L (ref 22–32)
Calcium: 9 mg/dL (ref 8.9–10.3)
Chloride: 104 mmol/L (ref 98–111)
Creatinine: 0.79 mg/dL (ref 0.61–1.24)
GFR, Est AFR Am: >60 mL/min (ref >60)
GFR, Estimated: >60 mL/min (ref >60)
Glucose, Bld: 97 mg/dL (ref 70–99)
Potassium: 4.4 mmol/L (ref 3.5–5.1)
Sodium: 139 mmol/L (ref 135–145)
Total Bilirubin: 0.6 mg/dL (ref 0.3–1.2)
Total Protein: 7.6 g/dL (ref 6.5–8.1)

## 2019-03-29 LAB — MAGNESIUM: Magnesium: 2.2 mg/dL (ref 1.7–2.4)

## 2019-03-30 ENCOUNTER — Telehealth: Payer: Self-pay | Admitting: Nurse Practitioner

## 2019-03-30 LAB — URINE CULTURE: Culture: NO GROWTH

## 2019-03-30 NOTE — Telephone Encounter (Signed)
Scheduled appt per 7/23 los.

## 2019-04-02 ENCOUNTER — Telehealth: Payer: Self-pay

## 2019-04-02 ENCOUNTER — Encounter: Payer: Medicare Other | Admitting: Pain Medicine

## 2019-04-02 NOTE — Telephone Encounter (Signed)
TC per Lacie to patient to let him know urine culture was negative. also Encouraged him to hydrate well. Patient verbalized understanding. No further problems or concerns at this time.

## 2019-04-09 NOTE — Progress Notes (Signed)
John [name]   Telephone:(336) [phone] Fax:(336) [phone]   Clinic Follow up Note   Patient Care Team: Cletis Athens, MD as PCP - General (Internal Medicine) Manya Silvas, MD (Gastroenterology)  Date of Service:  04/12/2019  CHIEF COMPLAINT: F/u ofadenocarcinoma in liver  SUMMARY OF ONCOLOGIC HISTORY: Oncology History  Cholangiocarcinoma ([place])  05/05/2018 Initial Biopsy   DIAGNOSIS: 05/05/18 A. LIVER MASS; ULTRASOUND-GUIDED BIOPSY:  - CYTOKERATIN 7 POSITIVE ADENOCARCINOMA WITH ABUNDANT NECROSIS, SEE  COMMENT.  Diagnosis 06/28/18 Consult- Comprehensive, [phone] Liver Bx - POORLY DIFFERENTIATED CARCINOMA INVOLVING LIVER PARENCHYMA. SEE NOTE. Diagnosis Note Most of the tumor shows a abortive glad formation, consistent with an adenocarcinoma. However, in some areas, the tumor cells show increased cytoplasm with a nested architecture, giving them a hepatoid appearance. Immunohistochemical stains performed at the outside institution show that the tumor cells are positive for CK7 and negative for TTF-1, Napsin-A, CDX-2, Hepar-1 and CD34. Immunostains performed at [facility] show that glypican-3 has patchy positive staining in the hepatoid-appearing tumor cells. Arginase and AFP stains show very focal, weak staining. Immunostain of pCEA shows focal canalicular-like staining pattern in the tumor cells. The findings are consistent with a cholangiocarcinoma but the the tumor histomorphology and staining pattern are suggestive of a hepatocellular carcinoma component, raising possibility of a combined hepatocellular cholangiocarcinoma. Additional biopsies or re-evaluation on the resection specimen is suggested for confirmation.   06/02/2018 PET scan   PET 06/02/18  IMPRESSION: 1. Solitary hypermetabolic lesion in the central LEFT hepatic lobe consistent with malignancy. 2. No metabolic activity remaining in LEFT apical pulmonary nodule which is reduced in size. 3. No  evidence of metastatic adenopathy on skull base to thigh FDG PET scan. 4. Cirrhotic liver.  Cholelithiasis 5.  Aortic Atherosclerosis (ICD10-I70.0).   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 Jearld Shines at [facility]  Estimated dose to liver target: 117 Gy Estimated dose to lung: 5.56 Gy Impression: Successful radioembolization with administration of intravascular yttrium-90 Theraspheres as described above.   10/18/2018 Imaging   US Abdomen 10/18/18 IMPRESSION: 1. Cholelithiasis without evidence of acute cholecystitis. 2. 2.2 cm left hepatic lobe mass consistent with known malignancy. 3. Cirrhosis and splenomegaly.   11/09/2018 Imaging   MRI Abdomen 11/09/18 at [facility]  Impression: Marked increased size of the mass centered in segment 4A with new satellite lesions seen in segments 2 and 3, likely representing multifocal mass-forming cholangiocarcinoma. New tumor invasion seen within the right anterior portal vein and middle hepatic vein to the level of the IVC, which is a pattern more commonly seen with hepatocellular carcinoma.   11/25/2018 Miscellaneous   Foundation One 3/21.2020  MSI -Stable - cannot be determined  Tumor Mutation Burden - cannot be determined  TERT promoter 124C>T TP53 [id number]   11/28/2018 Imaging   CT Chest 11/28/18  IMPRESSION: 1. Further improvement in left apical nodule, now nearly resolved. Mild surrounding radiation changes. 2. No new or enlarging pulmonary nodules. 3. Treated lesion in the left hepatic lobe is not optimally visualized on this noncontrast study, although appears slightly larger. 4. Progressive adenopathy in the upper abdomen. 5. Additional incidental findings are stable, including cirrhosis, splenomegaly, aortic valvular calcifications, Aortic Atherosclerosis (ICD10-I70.0) and Emphysema (ICD10-J43.9).   12/25/2018 Cancer Staging   Staging form:  Intrahepatic Bile Duct, AJCC 8th Edition - Clinical stage from 12/25/2018: Stage II (cT2(m), cN0, cM0) - Signed by [name], MD on 12/27/2018   01/24/2019 -  Chemotherapy  First line Cisplatin and Gemcitabine every 2 weeks starting 01/24/19  -He did have reaction to IV Emend and dexa with SOB, chest pain and flushing, will d/c and will switch dexa to Solu-Medrol on cycle 2.    03/28/2019 Imaging   MRI IMPRESSION: 1. Slight interval increase in size of the large necrotic central hepatic neoplasm. 2. Stable celiac axis lymph node and slightly enlarged left para-aortic, right hepatoduodenal ligament and epicardial lymph nodes. 3. 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 4. Stable splenomegaly.      CURRENT THERAPY:  ChemoGemcitabine and Cisplatinevery 2 weeks starting 01/24/19   INTERVAL HISTORY:  John [name] is here for a follow up and treatment. He presents to the clinic alone. He notes yesterday he has upper right abdominal pain, but that has resolved. He feels better today. He takes laxatives to make sure he has a BM regularly. He feels no different than before his chemo 3 weeks break. He brought banana nut bread for the clinic.  He notes his lab access is doing well but when he is accessed higher up on forearm he will have neck pain afterward. He is still deciding on getting PAC or not. He notes the cost of his treatment increased and not sure why.    REVIEW OF SYSTEMS:   Constitutional: Denies fevers, chills or abnormal weight loss Eyes: Denies blurriness of vision Ears, nose, mouth, throat, and face: Denies mucositis or sore throat Respiratory: Denies cough, dyspnea or wheezes Cardiovascular: Denies palpitation, chest discomfort or lower extremity swelling Gastrointestinal:  Denies nausea, heartburn or change in bowel habits Skin: Denies abnormal skin rashes Lymphatics: Denies new lymphadenopathy or easy bruising  Neurological:Denies numbness, tingling or new weaknesses Behavioral/Psych: Mood is stable, no new changes  All other systems were reviewed with the patient and are negative.  MEDICAL HISTORY:  Past Medical History:  Diagnosis Date  . Abnormal MRI, lumbar spine (2015) 01/21/2016   FINDINGS:  L3-4: Tiny right foraminal and extra foraminal disc bulge adjacent to but not compressing the L3 nerve lateral to the neural foramen. L4-5: There is a small broad-based disc bulge. There is also hypertrophy of the ligamentum flavum and facet joints, left more than right creating moderately severe spinal stenosis and bilateral lateral recess stenosis, left greater than right. This appe  . Anxiety   . Back ache 05/06/2014  . Cervical pain 05/06/2014  . 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[place]) 06/23/2015  . COPD (chronic obstructive pulmonary disease) ([place])   . De Quervain's tenosynovitis, left 07/07/2017  . Depression   . Dystonia   . Epileptic disorder ([place]) 06/23/2015  . Extremity pain 05/06/2014  . Febrile seizures ([place])    child  . Foot pain 09/11/2014  . Gastric ulcer 06/23/2015  . Gout   . H/O neoplasm 06/23/2015  . Head injury 06/23/2015  . Hemorrhoids   . Hepatic cirrhosis ([place]) 06/23/2015  . Hepatitis B   . History of GI bleed   . Leukopenia   . Liver cancer ([place]) 08/2018  . Neurogenic pain 01/21/2016  . Nodule of upper lobe of left lung 12/02/2017  . Non-small cell cancer of left lung ([place]) 2019   Rad tx's.   . Obstructive sleep apnea 06/23/2015  . 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 Osteoarthritis of spine with radiculopathy, lumbosacral region 06/23/2015  . Peripheral neuropathy 06/23/2015  . Peripheral neuropathy ([place]) 03/88/8280  . Personal history of tobacco use, presenting hazards to  health 01/09/2016  . Pulse irregularity   . Seizures ([place])   . Spinal stenosis   . Splenomegaly 05/30/2013  . Thrombocytopenia ([place]) 06/23/2015  . Uncomplicated  opioid dependence ([place]) 06/23/2015    SURGICAL HISTORY: Past Surgical History:  Procedure Laterality Date  . ESOPHAGOGASTRODUODENOSCOPY  11/2013  . ESOPHAGOGASTRODUODENOSCOPY (EGD) WITH PROPOFOL N/A 06/23/2018   Procedure: ESOPHAGOGASTRODUODENOSCOPY (EGD) WITH PROPOFOL;  Surgeon: Manya Silvas, MD;  Location: [facility] ENDOSCOPY;  Service: Endoscopy;  Laterality: N/A;  . surgery for cervical neck fracture    . TOOTH EXTRACTION Right 09/28/2016    I have reviewed the social history and family history with the patient and they are unchanged from previous note.  ALLERGIES:  is allergic to codeine; morphine; and duloxetine.  MEDICATIONS:  Current Outpatient Medications  Medication Sig Dispense Refill  . ALPRAZolam (XANAX) 0.25 MG tablet Take 0.25 mg by mouth at bedtime as needed for anxiety or sleep.     . Avatrombopag Maleate 20 MG TABS Take 40 mg by mouth daily. Take 3 tabs = 60 mg   daily 60 tablet 2  . baclofen (LIORESAL) 10 MG tablet Take 1 tablet (10 mg total) by mouth 3 (three) times daily. 270 tablet 0  . bisacodyl (DULCOLAX) 5 MG EC tablet Take 1 tablet (5 mg total) by mouth daily as needed for moderate constipation. 90 tablet 0  . cetirizine (ZYRTEC) 10 MG tablet Take 10 mg by mouth daily.  3  . folic acid (FOLVITE) 1 MG tablet Take 1 mg by mouth daily.     Marland Kitchen gabapentin (NEURONTIN) 600 MG tablet Take 1 tablet (600 mg total) by mouth 3 (three) times daily. 270 tablet 0  . Magnesium 500 MG CAPS Take 500 mg by mouth 2 (two) times a day.    Marland Kitchen omeprazole (PRILOSEC) 20 MG capsule Take 1 capsule (20 mg total) by mouth daily. 90 capsule 1  . ondansetron (ZOFRAN) 8 MG tablet Take 1 tablet (8 mg total) by mouth every 8 (eight) hours as needed for nausea or vomiting. 20 tablet 0  . [START ON 05/03/2019] oxyCODONE (OXY IR/ROXICODONE) 5 MG immediate release tablet Take 0.5 tablets (2.5 mg total) by mouth 2 (two) times daily. Must last 30 days 30 tablet 0  . [START ON 06/02/2019] oxyCODONE (OXY  IR/ROXICODONE) 5 MG immediate release tablet Take 0.5 tablets (2.5 mg total) by mouth 2 (two) times daily. Must last 30 days 30 tablet 0  . [START ON 07/02/2019] oxyCODONE (OXY IR/ROXICODONE) 5 MG immediate release tablet Take 0.5 tablets (2.5 mg total) by mouth 2 (two) times daily. Must last 30 days 30 tablet 0  . prochlorperazine (COMPAZINE) 10 MG tablet Take 1 tablet (10 mg total) by mouth every 6 (six) hours as needed for nausea or vomiting. 30 tablet 0   No current facility-administered medications for this visit.     PHYSICAL EXAMINATION: ECOG PERFORMANCE STATUS: 1 - Symptomatic but completely ambulatory  Vitals:   04/12/19 0910  BP: (!) 126/53  Pulse: (!) 52  Resp: 17  Temp: 98.2 F (36.8 C)  SpO2: 99%   Filed Weights   04/12/19 0910  Weight: 204 lb 4.8 oz (92.7 kg)    GENERAL:alert, no distress and comfortable SKIN: skin color, texture, turgor are normal, no rashes or significant lesions EYES: normal, Conjunctiva are pink and non-injected, sclera clear  NECK: supple, thyroid normal size, non-tender, without nodularity LYMPH:  no palpable lymphadenopathy in the cervical, axillary  LUNGS: clear to auscultation and percussion with  normal breathing effort HEART: regular rate & rhythm and no murmurs and no lower extremity edema ABDOMEN:abdomen soft, non-tender and normal bowel sounds (+) Diffuse lower abdominal tenderness (+) significant of RUQ tenderness Musculoskeletal:no cyanosis of digits and no clubbing  NEURO: alert & oriented x 3 with fluent speech, no focal motor/sensory deficits  LABORATORY DATA:  I have reviewed the data as listed CBC Latest Ref Rng & Units 04/12/2019 03/29/2019 03/14/2019  WBC 4.0 - 10.5 K/uL 3.3(L) 3.0(L) 2.7(L)  Hemoglobin 13.0 - 17.0 g/dL 9.9(L) 9.5(L) 9.9(L)  Hematocrit 39.0 - 52.0 % 32.3(L) 30.7(L) 31.4(L)  Platelets 150 - 400 K/uL 150 126(L) 82(L)     CMP Latest Ref Rng & Units 04/12/2019 03/29/2019 03/14/2019  Glucose 70 - 99 mg/dL 101(H) 97  99  BUN 8 - 23 mg/dL 6(L) 7(L) 7(L)  Creatinine 0.61 - 1.24 mg/dL 0.80 0.79 0.80  Sodium 135 - 145 mmol/L 138 139 139  Potassium 3.5 - 5.1 mmol/L 4.1 4.4 4.0  Chloride 98 - 111 mmol/L 102 104 105  CO2 22 - 32 mmol/L _0 Calcium 8.9 - 10.3 mg/dL 9.0 9.0 8.9  Total Protein 6.5 - 8.1 g/dL 7.6 7.6 7.7  Total Bilirubin 0.3 - 1.2 mg/dL 0.5 0.6 0.7  Alkaline Phos 38 - 126 U/L 91 90 89  AST 15 - 41 U/L _1 ALT 0 - 44 U/L _2 RADIOGRAPHIC STUDIES: I have personally reviewed the radiological images as listed and agreed with the findings in the report. No results found.   ASSESSMENT & PLAN:  John [name] is a 63 y.o. 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 His liver biopsy showed poorly differentiated adenocarcinoma, CK7 positive, other markers were negative. Upper endoscopy was negative for malignancy.PET scan otherwise negative for other primary tumor. -His biopsy was evaluated by our pathologistDr. Rogue Jury here, who feels this ismost consistent with cholangiocarcinoma, with possible component of hepatocellular carcinoma. -CancertypeIDtestshowed 73% chance ofpancreaticobiliary primary -He was seen by surgeon Dr. Zenia Resides [facility] in 07/2018 who did not recommend surgery butreferredhim to IR for target liver therapy. -He underwentY90 radio embolization on12/19/19with Dr. Jearld Shines at [facility].  -Given hislivertumor is non-resectable, it's incurable at this stagebut still treatable.I started him onfirst linechemocisplatin and gemcitabineon 5/20/20with dose reduction.Given COVID-19and his thrombocytopenia,I will start him on chemo every 2 weeks. -03/2019 liver MRI shows slightly (10%) increase of liver mass, still consider stable disease. Plan to repeat scan after cycle 6 in 05/2019. If he has further growth of his tumor, I will change his treatment regimen. May  revisit the option of target therapy to liver if responding well.  -He is tolerating chemo well with fatigue with increased dose. If he is no longer able to tolerate peripheral access, he can request a PAC to be placed.  -On exam today he has RUQ tenderness which is expected from cancer in liver.  -Labs reviewed, CBC and CMP WNL except WBC 3.3, Hg 9.9, BG 101, BUN 6. Mag normal. Ca 19.9 still pending. Overall adequate to proceed with Cisplatin/Gemcitabine at increased dose of 884m/m2.  -F/u in 2 weeks, he may want to take short chemo break in 1-2 months  2.Hepatitis B and liver cirrhosis -He doesn't see a hepatologist nor an infectious disease physician,he has never received hepatitis B treatment. -will repeat Hep BS antigen, Santibody, core antibody, and a viral load -No varices on 06/2018 Endoscopy.  -I  encouraged him to return to GI Dr. Tiffany Kocher for management.  3. 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 -Given he is on chemotherapy, Ipreviouslyprescribed him Doptelet.   -PLT normalized to 150K today (04/12/19). Continue Doptelet 94m BID.   4. Left apical lungcancer stage IA,s/p SBRT, COPD -PET scanon 11/24/2017 revealed a hypermetabolic 1.0 cm left apical pulmonary nodulefavoring malignancy. Assuming non-small cell lung cancer this represents T1a N0 M0 disease (stage IA).Biopsy was not obtained -Underwent SBRT 01/05/18-02/01/18 -CT chest from 11/28/18 shows good improvement and no new nodules.  5. Social Support, FAcupuncturist -He lives by himself.  -He has 2 brothers but 1 does not live very close. -I discussed our resources to help with transportation if needed. -He notes increases in billing of his current treatment. I recommend he consult with his financial advocate SMarguarite Arbourabout this for more help. He is agreeable.     PLAN: -Labs reviewed and adequate to  proceed with Cisplatin and Gemcitabine today with increase gemcitabine back to 8048mm2 -Lab, f/u, and chemo in 2 weeks  -continue Avatrombopag [id number]    No problem-specific Assessment & Plan notes found for this encounter.   No orders of the defined types were placed in this encounter.  All questions were answered. The patient knows to call the clinic with any problems, questions or concerns. No barriers to learning was detected. I spent 20 minutes counseling the patient face to face. The total time spent in the appointment was 25 minutes and more than 50% was on counseling and review of test results     YanTruitt MerleD 04/12/2019   I, AmoJoslyn Devonm acting as scribe for YanTruitt MerleD.   I have reviewed the above documentation for accuracy and completeness, and I agree with the above.

## 2019-04-12 ENCOUNTER — Inpatient Hospital Stay: Payer: Medicare Other

## 2019-04-12 ENCOUNTER — Other Ambulatory Visit: Payer: Self-pay

## 2019-04-12 ENCOUNTER — Inpatient Hospital Stay (HOSPITAL_BASED_OUTPATIENT_CLINIC_OR_DEPARTMENT_OTHER): Payer: Medicare Other | Admitting: Hematology

## 2019-04-12 ENCOUNTER — Encounter: Payer: Self-pay | Admitting: Hematology

## 2019-04-12 ENCOUNTER — Inpatient Hospital Stay: Payer: Medicare Other | Attending: Hematology

## 2019-04-12 ENCOUNTER — Telehealth: Payer: Self-pay | Admitting: Hematology

## 2019-04-12 VITALS — BP 126/53 | HR 52 | Temp 98.2°F | Resp 17 | Ht 72.0 in | Wt 204.3 lb

## 2019-04-12 DIAGNOSIS — C221 Intrahepatic bile duct carcinoma: Secondary | ICD-10-CM | POA: Insufficient documentation

## 2019-04-12 DIAGNOSIS — K7030 Alcoholic cirrhosis of liver without ascites: Secondary | ICD-10-CM

## 2019-04-12 DIAGNOSIS — D696 Thrombocytopenia, unspecified: Secondary | ICD-10-CM | POA: Diagnosis not present

## 2019-04-12 DIAGNOSIS — Z5111 Encounter for antineoplastic chemotherapy: Secondary | ICD-10-CM | POA: Insufficient documentation

## 2019-04-12 DIAGNOSIS — C229 Malignant neoplasm of liver, not specified as primary or secondary: Secondary | ICD-10-CM

## 2019-04-12 LAB — CMP (CANCER CENTER ONLY)
ALT: 11 U/L (ref 0–44)
AST: 25 U/L (ref 15–41)
Albumin: 3.6 g/dL (ref 3.5–5.0)
Alkaline Phosphatase: 91 U/L (ref 38–126)
Anion gap: 11 (ref 5–15)
BUN: 6 mg/dL — ABNORMAL LOW (ref 8–23)
CO2: 25 mmol/L (ref 22–32)
Calcium: 9 mg/dL (ref 8.9–10.3)
Chloride: 102 mmol/L (ref 98–111)
Creatinine: 0.8 mg/dL (ref 0.61–1.24)
GFR, Est AFR Am: >60 mL/min (ref >60)
GFR, Estimated: >60 mL/min (ref >60)
Glucose, Bld: 101 mg/dL — ABNORMAL HIGH (ref 70–99)
Potassium: 4.1 mmol/L (ref 3.5–5.1)
Sodium: 138 mmol/L (ref 135–145)
Total Bilirubin: 0.5 mg/dL (ref 0.3–1.2)
Total Protein: 7.6 g/dL (ref 6.5–8.1)

## 2019-04-12 LAB — CBC WITH DIFFERENTIAL (CANCER CENTER ONLY)
Abs Immature Granulocytes: 0.01 10*3/uL (ref 0.00–0.07)
Basophils Absolute: 0 10*3/uL (ref 0.0–0.1)
Basophils Relative: 1 %
Eosinophils Absolute: 0.1 10*3/uL (ref 0.0–0.5)
Eosinophils Relative: 3 %
HCT: 32.3 % — ABNORMAL LOW (ref 39.0–52.0)
Hemoglobin: 9.9 g/dL — ABNORMAL LOW (ref 13.0–17.0)
Immature Granulocytes: 0 %
Lymphocytes Relative: 12 %
Lymphs Abs: 0.4 10*3/uL — ABNORMAL LOW (ref 0.7–4.0)
MCH: 29.8 pg (ref 26.0–34.0)
MCHC: 30.7 g/dL (ref 30.0–36.0)
MCV: 97.3 fL (ref 80.0–100.0)
Monocytes Absolute: 0.4 10*3/uL (ref 0.1–1.0)
Monocytes Relative: 12 %
Neutro Abs: 2.4 10*3/uL (ref 1.7–7.7)
Neutrophils Relative %: 72 %
Platelet Count: 150 10*3/uL (ref 150–400)
RBC: 3.32 MIL/uL — ABNORMAL LOW (ref 4.22–5.81)
RDW: 19.2 % — ABNORMAL HIGH (ref 11.5–15.5)
WBC Count: 3.3 10*3/uL — ABNORMAL LOW (ref 4.0–10.5)
nRBC: 0 % (ref 0.0–0.2)

## 2019-04-12 LAB — MAGNESIUM: Magnesium: 2 mg/dL (ref 1.7–2.4)

## 2019-04-12 MED ORDER — SODIUM CHLORIDE 0.9 % IV SOLN
25.0000 mg/m2 | Freq: Once | INTRAVENOUS | Status: AC
  Administered 2019-04-12: 54 mg via INTRAVENOUS
  Filled 2019-04-12: qty 54

## 2019-04-12 MED ORDER — PALONOSETRON HCL INJECTION 0.25 MG/5ML
0.2500 mg | Freq: Once | INTRAVENOUS | Status: AC
  Administered 2019-04-12: 13:00:00 0.25 mg via INTRAVENOUS

## 2019-04-12 MED ORDER — POTASSIUM CHLORIDE 2 MEQ/ML IV SOLN
Freq: Once | INTRAVENOUS | Status: AC
  Administered 2019-04-12: 10:00:00 via INTRAVENOUS
  Filled 2019-04-12: qty 10

## 2019-04-12 MED ORDER — SODIUM CHLORIDE 0.9 % IV SOLN
Freq: Once | INTRAVENOUS | Status: AC
  Administered 2019-04-12: 10:00:00 via INTRAVENOUS
  Filled 2019-04-12: qty 250

## 2019-04-12 MED ORDER — METHYLPREDNISOLONE SODIUM SUCC 40 MG IJ SOLR
INTRAMUSCULAR | Status: AC
  Filled 2019-04-12: qty 1

## 2019-04-12 MED ORDER — METHYLPREDNISOLONE SODIUM SUCC 40 MG IJ SOLR
60.0000 mg | Freq: Once | INTRAMUSCULAR | Status: AC
  Administered 2019-04-12: 13:00:00 60 mg via INTRAVENOUS

## 2019-04-12 MED ORDER — SODIUM CHLORIDE 0.9 % IV SOLN
800.0000 mg/m2 | Freq: Once | INTRAVENOUS | Status: AC
  Administered 2019-04-12: 1748 mg via INTRAVENOUS
  Filled 2019-04-12: qty 45.97

## 2019-04-12 MED ORDER — SODIUM CHLORIDE 0.9 % IV SOLN
Freq: Once | INTRAVENOUS | Status: AC
  Administered 2019-04-12: 13:00:00 via INTRAVENOUS
  Filled 2019-04-12: qty 250

## 2019-04-12 MED ORDER — PALONOSETRON HCL INJECTION 0.25 MG/5ML
INTRAVENOUS | Status: AC
  Filled 2019-04-12: qty 5

## 2019-04-12 NOTE — Telephone Encounter (Signed)
Scheduled appt per 8/6 los.

## 2019-04-12 NOTE — Patient Instructions (Signed)
Coronavirus (COVID-19) Are you at risk?  Are you at risk for the Coronavirus (COVID-19)?  To be considered HIGH RISK for Coronavirus (COVID-19), you have to meet the following criteria:  . Traveled to [place], [place], [place], [place] or [place]; or in the [place] to [place], [place], [place], or [place]; and have fever, cough, and shortness of breath within the last 2 weeks of travel OR . Been in close contact with a person diagnosed with COVID-19 within the last 2 weeks and have fever, cough, and shortness of breath . IF YOU DO NOT MEET THESE CRITERIA, YOU ARE CONSIDERED LOW RISK FOR COVID-19.  What to do if you are HIGH RISK for COVID-19?  Marland Kitchen If you are having a medical emergency, call 911. . Seek medical care right away. Before you go to a doctor's office, urgent care or emergency department, call ahead and tell them about your recent travel, contact with someone diagnosed with COVID-19, and your symptoms. You should receive instructions from your physician's office regarding next steps of care.  . When you arrive at healthcare provider, tell the healthcare staff immediately you have returned from visiting [place], [place], [place], [place] or [place]; or traveled in the [place] to [place], [place], [place], or [place]; in the last two weeks or you have been in close contact with a person diagnosed with COVID-19 in the last 2 weeks.   . Tell the health care staff about your symptoms: fever, cough and shortness of breath. . After you have been seen by a medical provider, you will be either: o Tested for (COVID-19) and discharged home on quarantine except to seek medical care if symptoms worsen, and asked to  - Stay home and avoid contact with others until you get your results (4-5 days)  - Avoid travel on public transportation if possible (such as bus, train, or airplane) or o Sent to the Emergency Department by EMS for evaluation, COVID-19 testing, and possible  admission depending on your condition and test results.  What to do if you are LOW RISK for COVID-19?  Reduce your risk of any infection by using the same precautions used for avoiding the common cold or flu:  Marland Kitchen Wash your hands often with soap and warm water for at least 20 seconds.  If soap and water are not readily available, use an alcohol-based hand sanitizer with at least 60% alcohol.  . If coughing or sneezing, cover your mouth and nose by coughing or sneezing into the elbow areas of your shirt or coat, into a tissue or into your sleeve (not your hands). . Avoid shaking hands with others and consider head nods or verbal greetings only. . Avoid touching your eyes, nose, or mouth with unwashed hands.  . Avoid close contact with people who are sick. . Avoid places or events with large numbers of people in one location, like concerts or sporting events. . Carefully consider travel plans you have or are making. . If you are planning any travel outside or inside the [place], visit the CDC's Travelers' Health webpage for the latest health notices. . If you have some symptoms but not all symptoms, continue to monitor at home and seek medical attention if your symptoms worsen. . If you are having a medical emergency, call 911.   [place] / e-Visit: [email or web address]         [facility]: [place]  [facility]gent [facility]: [place] [facility]: [place] Discharge Instructions for Patients Receiving Chemotherapy  Today you received the following chemotherapy agents Gemzar and Cisplatin   To help prevent nausea and vomiting after your treatment, we encourage you to take your nausea medication as directed.    If you develop nausea and vomiting that is not controlled by your nausea medication, call the clinic.    BELOW ARE SYMPTOMS THAT SHOULD BE REPORTED IMMEDIATELY:  *FEVER GREATER THAN 100.5 F  *CHILLS WITH OR WITHOUT FEVER  NAUSEA AND VOMITING THAT IS NOT CONTROLLED WITH YOUR NAUSEA MEDICATION  *UNUSUAL SHORTNESS OF BREATH  *UNUSUAL BRUISING OR BLEEDING  TENDERNESS IN MOUTH AND THROAT WITH OR WITHOUT PRESENCE OF ULCERS  *URINARY PROBLEMS  *BOWEL PROBLEMS  UNUSUAL RASH Items with * indicate a potential emergency and should be followed up as soon as possible.  Feel free to call the clinic should you have any questions or concerns. The clinic phone number is [phone].  Please show the [place] at check-in to the Emergency Department and triage nurse.

## 2019-04-13 ENCOUNTER — Encounter: Payer: Self-pay | Admitting: Hematology

## 2019-04-13 LAB — CANCER ANTIGEN 19-9: CA 19-9: 34 U/mL (ref 0–35)

## 2019-04-13 NOTE — Progress Notes (Signed)
Met with patient on 04/12/19 whom had questions regarding his billing. He states he spoke with the billing department and was left on hold so he hung up.   Advised patient I would review and give him a call back. Printed EOBs for the patient and left material at his bedside while sleeping during his treatment along with my card.  Called patient today to discuss EOBs and other resources. Advised patient that I included a Medicaid application in the material that I left for him and he could complete and turn into DSS in his county. Also gave him a [facility] FAA to complete if his balance meets the required discussed amount for the hardship settlement. Provided with material on [employer] where he can combine his bills and make one monthly payment.  Discuussed the one-time $48 [employer] and qualifications to assist with personal household expenses and gas cards while going through treatment. Based on verbal income, he qualifies. Advised to bring proof of income at his next visit. He verbalized understanding.  He has my card for any additional financial questions or concerns.

## 2019-04-25 NOTE — Progress Notes (Signed)
[place]   [phone] Fax:([phone]) [phone]   Clinic Follow up Note   Patient Care Team: Cletis Athens, MD as PCP - General (Internal Medicine) Manya Silvas, MD (Gastroenterology)  Date of Service:  04/26/2019  CHIEF COMPLAINT: F/u ofadenocarcinoma in liver  SUMMARY OF ONCOLOGIC HISTORY: Oncology History  Cholangiocarcinoma ([place])  05/05/2018 Initial Biopsy   DIAGNOSIS: 05/05/18 A. LIVER MASS; ULTRASOUND-GUIDED BIOPSY:  - CYTOKERATIN 7 POSITIVE ADENOCARCINOMA WITH ABUNDANT NECROSIS, SEE  COMMENT.  Diagnosis 06/28/18 Consult- Comprehensive, [id number] Liver Bx - POORLY DIFFERENTIATED CARCINOMA INVOLVING LIVER PARENCHYMA. SEE NOTE. Diagnosis Note Most of the tumor shows a abortive glad formation, consistent with an adenocarcinoma. However, in some areas, the tumor cells show increased cytoplasm with a nested architecture, giving them a hepatoid appearance. Immunohistochemical stains performed at the outside institution show that the tumor cells are positive for CK7 and negative for TTF-1, Napsin-A, CDX-2, Hepar-1 and CD34. Immunostains performed at [facility] show that glypican-3 has patchy positive staining in the hepatoid-appearing tumor cells. Arginase and AFP stains show very focal, weak staining. Immunostain of pCEA shows focal canalicular-like staining pattern in the tumor cells. The findings are consistent with a cholangiocarcinoma but the the tumor histomorphology and staining pattern are suggestive of a hepatocellular carcinoma component, raising possibility of a combined hepatocellular cholangiocarcinoma. Additional biopsies or re-evaluation on the resection specimen is suggested for confirmation.   06/02/2018 PET scan   PET 06/02/18  IMPRESSION: 1. Solitary hypermetabolic lesion in the central LEFT hepatic lobe consistent with malignancy. 2. No metabolic activity remaining in LEFT apical pulmonary nodule which is reduced in size. 3. No  evidence of metastatic adenopathy on skull base to thigh FDG PET scan. 4. Cirrhotic liver.  Cholelithiasis 5.  Aortic Atherosclerosis (ICD10-I70.0).   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 Jearld Shines at [facility]  Estimated dose to liver target: 117 Gy Estimated dose to lung: 5.56 Gy Impression: Successful radioembolization with administration of intravascular yttrium-90 Theraspheres as described above.   10/18/2018 Imaging   US Abdomen 10/18/18 IMPRESSION: 1. Cholelithiasis without evidence of acute cholecystitis. 2. 2.2 cm left hepatic lobe mass consistent with known malignancy. 3. Cirrhosis and splenomegaly.   11/09/2018 Imaging   MRI Abdomen 11/09/18 at [facility]  Impression: Marked increased size of the mass centered in segment 4A with new satellite lesions seen in segments 2 and 3, likely representing multifocal mass-forming cholangiocarcinoma. New tumor invasion seen within the right anterior portal vein and middle hepatic vein to the level of the IVC, which is a pattern more commonly seen with hepatocellular carcinoma.   11/25/2018 Miscellaneous   Foundation One 3/21.2020  MSI -Stable - cannot be determined  Tumor Mutation Burden - cannot be determined  TERT promoter 124C>T TP53 [id number]   11/28/2018 Imaging   CT Chest 11/28/18  IMPRESSION: 1. Further improvement in left apical nodule, now nearly resolved. Mild surrounding radiation changes. 2. No new or enlarging pulmonary nodules. 3. Treated lesion in the left hepatic lobe is not optimally visualized on this noncontrast study, although appears slightly larger. 4. Progressive adenopathy in the upper abdomen. 5. Additional incidental findings are stable, including cirrhosis, splenomegaly, aortic valvular calcifications, Aortic Atherosclerosis (ICD10-I70.0) and Emphysema (ICD10-J43.9).   12/25/2018 Cancer Staging   Staging form:  Intrahepatic Bile Duct, AJCC 8th Edition - Clinical stage from 12/25/2018: Stage II (cT2(m), cN0, cM0) - Signed by [name], MD on 12/27/2018   01/24/2019 -  Chemotherapy  First line Cisplatin and Gemcitabine every 2 weeks starting 01/24/19  -He did have reaction to IV Emend and dexa with SOB, chest pain and flushing, will d/c and will switch dexa to Solu-Medrol on cycle 2.    03/28/2019 Imaging   MRI IMPRESSION: 1. Slight interval increase in size of the large necrotic central hepatic neoplasm. 2. Stable celiac axis lymph node and slightly enlarged left para-aortic, right hepatoduodenal ligament and epicardial lymph nodes. 3. 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 4. Stable splenomegaly.      CURRENT THERAPY:  ChemoGemcitabine and Cisplatinevery 2 weeks starting 01/24/19  INTERVAL HISTORY:  John [name] is here for a follow up and treatment. He presents to the clinic alone. He is clinically stable, with moderate fatigue, but able to function well at home.  He has had occasional abdominal pain, no nausea, diarrhea, or other new symptoms. No fever or bleeding.     REVIEW OF SYSTEMS:   Constitutional: Denies fevers, chills or abnormal weight loss, (+) moderate fatigue  Eyes: Denies blurriness of vision Ears, nose, mouth, throat, and face: Denies mucositis or sore throat Respiratory: Denies cough, dyspnea or wheezes Cardiovascular: Denies palpitation, chest discomfort or lower extremity swelling Gastrointestinal:  Denies nausea, heartburn or change in bowel habits Skin: Denies abnormal skin rashes Lymphatics: Denies new lymphadenopathy or easy bruising Neurological:Denies numbness, tingling or new weaknesses Behavioral/Psych: Mood is stable, no new changes  All other systems were reviewed with the patient and are negative.  MEDICAL HISTORY:  Past Medical History:  Diagnosis Date  . Abnormal MRI, lumbar spine (2015) 01/21/2016    FINDINGS:  L3-4: Tiny right foraminal and extra foraminal disc bulge adjacent to but not compressing the L3 nerve lateral to the neural foramen. L4-5: There is a small broad-based disc bulge. There is also hypertrophy of the ligamentum flavum and facet joints, left more than right creating moderately severe spinal stenosis and bilateral lateral recess stenosis, left greater than right. This appe  . Anxiety   . Back ache 05/06/2014  . Cervical pain 05/06/2014  . 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[place]) 06/23/2015  . COPD (chronic obstructive pulmonary disease) ([place])   . De Quervain's tenosynovitis, left 07/07/2017  . Depression   . Dystonia   . Epileptic disorder ([place]) 06/23/2015  . Extremity pain 05/06/2014  . Febrile seizures ([place])    child  . Foot pain 09/11/2014  . Gastric ulcer 06/23/2015  . Gout   . H/O neoplasm 06/23/2015  . Head injury 06/23/2015  . Hemorrhoids   . Hepatic cirrhosis ([place]) 06/23/2015  . Hepatitis B   . History of GI bleed   . Leukopenia   . Liver cancer ([place]) 08/2018  . Neurogenic pain 01/21/2016  . Nodule of upper lobe of left lung 12/02/2017  . Non-small cell cancer of left lung ([place]) 2019   Rad tx's.   . Obstructive sleep apnea 06/23/2015  . 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 Osteoarthritis of spine with radiculopathy, lumbosacral region 06/23/2015  . Peripheral neuropathy 06/23/2015  . Peripheral neuropathy ([place]) 62/13/0865  . Personal history of tobacco use, presenting hazards to health 01/09/2016  . Pulse irregularity   . Seizures ([place])   . Spinal stenosis   . Splenomegaly 05/30/2013  . Thrombocytopenia ([place]) 06/23/2015  . Uncomplicated opioid dependence ([place]) 06/23/2015    SURGICAL HISTORY: Past Surgical History:  Procedure Laterality Date  . ESOPHAGOGASTRODUODENOSCOPY  11/2013  . ESOPHAGOGASTRODUODENOSCOPY (EGD) WITH PROPOFOL  N/A 06/23/2018   Procedure: ESOPHAGOGASTRODUODENOSCOPY (EGD) WITH PROPOFOL;  Surgeon:  Manya Silvas, MD;  Location: [facility] ENDOSCOPY;  Service: Endoscopy;  Laterality: N/A;  . surgery for cervical neck fracture    . TOOTH EXTRACTION Right 09/28/2016    I have reviewed the social history and family history with the patient and they are unchanged from previous note.  ALLERGIES:  is allergic to codeine; morphine; and duloxetine.  MEDICATIONS:  Current Outpatient Medications  Medication Sig Dispense Refill  . ALPRAZolam (XANAX) 0.25 MG tablet Take 0.25 mg by mouth at bedtime as needed for anxiety or sleep.     . Avatrombopag Maleate 20 MG TABS Take 40 mg by mouth daily. Take 3 tabs = 60 mg   daily 60 tablet 2  . baclofen (LIORESAL) 10 MG tablet Take 1 tablet (10 mg total) by mouth 3 (three) times daily. 270 tablet 0  . bisacodyl (DULCOLAX) 5 MG EC tablet Take 1 tablet (5 mg total) by mouth daily as needed for moderate constipation. 90 tablet 0  . cetirizine (ZYRTEC) 10 MG tablet Take 10 mg by mouth daily.  3  . folic acid (FOLVITE) 1 MG tablet Take 1 mg by mouth daily.     Marland Kitchen gabapentin (NEURONTIN) 600 MG tablet Take 1 tablet (600 mg total) by mouth 3 (three) times daily. 270 tablet 0  . Magnesium 500 MG CAPS Take 500 mg by mouth 2 (two) times a day.    Marland Kitchen omeprazole (PRILOSEC) 20 MG capsule Take 1 capsule (20 mg total) by mouth daily. 90 capsule 1  . ondansetron (ZOFRAN) 8 MG tablet Take 1 tablet (8 mg total) by mouth every 8 (eight) hours as needed for nausea or vomiting. 20 tablet 0  . [START ON 05/03/2019] oxyCODONE (OXY IR/ROXICODONE) 5 MG immediate release tablet Take 0.5 tablets (2.5 mg total) by mouth 2 (two) times daily. Must last 30 days 30 tablet 0  . [START ON 06/02/2019] oxyCODONE (OXY IR/ROXICODONE) 5 MG immediate release tablet Take 0.5 tablets (2.5 mg total) by mouth 2 (two) times daily. Must last 30 days 30 tablet 0  . [START ON 07/02/2019] oxyCODONE (OXY IR/ROXICODONE) 5 MG immediate release tablet Take 0.5 tablets (2.5 mg total) by mouth 2 (two) times daily. Must  last 30 days 30 tablet 0  . prochlorperazine (COMPAZINE) 10 MG tablet Take 1 tablet (10 mg total) by mouth every 6 (six) hours as needed for nausea or vomiting. 30 tablet 0   No current facility-administered medications for this visit.     PHYSICAL EXAMINATION: ECOG PERFORMANCE STATUS: 1 - Symptomatic but completely ambulatory  Vitals:   04/26/19 0909  BP: 135/66  Pulse: 63  Resp: 18  Temp: 98.7 F (37.1 C)  SpO2: 100%   Filed Weights   04/26/19 0909  Weight: 202 lb 4.8 oz (91.8 kg)   GENERAL:alert, no distress and comfortable SKIN: skin color, texture, turgor are normal, no rashes or significant lesions EYES: normal, Conjunctiva are pink and non-injected, sclera clear  NECK: supple, thyroid normal size, non-tender, without nodularity LYMPH:  no palpable lymphadenopathy in the cervical, axillary  LUNGS: clear to auscultation and percussion with normal breathing effort HEART: regular rate & rhythm and no murmurs and no lower extremity edema ABDOMEN:abdomen soft, non-tender and normal bowel sounds Musculoskeletal:no cyanosis of digits and no clubbing  NEURO: alert & oriented x 3 with fluent speech, no focal motor/sensory deficits  LABORATORY DATA:  I have reviewed the data as listed CBC Latest Ref Rng &  Units 04/26/2019 04/12/2019 03/29/2019  WBC 4.0 - 10.5 K/uL 2.6(L) 3.3(L) 3.0(L)  Hemoglobin 13.0 - 17.0 g/dL 9.4(L) 9.9(L) 9.5(L)  Hematocrit 39.0 - 52.0 % 30.3(L) 32.3(L) 30.7(L)  Platelets 150 - 400 K/uL 79(L) 150 126(L)     CMP Latest Ref Rng & Units 04/26/2019 04/12/2019 03/29/2019  Glucose 70 - 99 mg/dL 91 101(H) 97  BUN 8 - 23 mg/dL 9 6(L) 7(L)  Creatinine 0.61 - 1.24 mg/dL 0.77 0.80 0.79  Sodium 135 - 145 mmol/L 137 138 139  Potassium 3.5 - 5.1 mmol/L 4.1 4.1 4.4  Chloride 98 - 111 mmol/L 104 102 104  CO2 22 - 32 mmol/L _0 Calcium 8.9 - 10.3 mg/dL 8.8(L) 9.0 9.0  Total Protein 6.5 - 8.1 g/dL 7.7 7.6 7.6  Total Bilirubin 0.3 - 1.2 mg/dL 0.5 0.5 0.6  Alkaline  Phos 38 - 126 U/L 84 91 90  AST 15 - 41 U/L _1 ALT 0 - 44 U/L _2 RADIOGRAPHIC STUDIES: I have personally reviewed the radiological images as listed and agreed with the findings in the report. No results found.   ASSESSMENT & PLAN:  John [name] is a 63 y.o. 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 His liver biopsy showed poorly differentiated adenocarcinoma, CK7 positive, other markers were negative. Upper endoscopy was negative for malignancy.PET scan otherwise negative for other primary tumor. -His biopsy was evaluated by our pathologistDr. Rogue Jury here, who feels this ismost consistent with cholangiocarcinoma, with possible component of hepatocellular carcinoma. -CancertypeIDtestshowed 12% chance ofpancreaticobiliary primary -He was seen by surgeon Dr. Zenia Resides at [facility] in 07/2018 who did not recommend surgery butreferredhim to IR for target liver therapy. -He underwentY90 radio embolization on12/19/19with Dr. Jearld Shines at [facility].  -Given hislivertumor is non-resectable, it's incurable at this stagebut still treatable.I started him onfirst linechemocisplatin and gemcitabineon 5/20/20q2weeks with dose reduction,given COVID-19and his thrombocytopenia. -03/2019 liver MRI shows stable disease/mild progression. Plan to repeat scan after cycle 6 in 05/2019. If he has further growth of his tumor, I will change his treatment regimen. May revisit the option of target therapy to liver if responding well. pt had repeatedly asked what to do if he has disease progression, I discussed the options again.  -He is tolerating chemo well with fatigue with increased dose. If he is no longer able to tolerate peripheral access, he can request a PAC to be placed. -lab reviewed, plt down to 79K, will decrease gemcitabine dose to 651m/m2  -Return to clinic in 2 weeks, plan to repeat  a scan after 2 more cycle chemo   2.Hepatitis B and liver cirrhosis -He doesn't see a hepatologist nor an infectious disease physician,he has never received hepatitis B treatment. -will repeat Hep BS antigen, Santibody, core antibody, and a viral load -No varices on 06/2018 Endoscopy.  -I encouraged him to return to GI Dr. ETiffany Kocherfor management.  3. 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 -Given he is on chemotherapy, Ipreviouslyprescribed him Doptelet.   -PLT 79K today (04/26/19). Continue Doptelet 410mBID.  4. Left apical lungcancer stage IA,s/p SBRT, COPD -PET scanon 11/24/2017 revealed a hypermetabolic 1.0 cm left apical pulmonary nodulefavoring malignancy. Assuming non-small cell lung cancer this represents T1a N0 M0 disease (stage IA).Biopsy was not obtained -Underwent SBRT 01/05/18-02/01/18 -CT chest from 11/28/18 shows good improvement and no new nodules.  5. Social  Support, Acupuncturist  -He lives by himself.  -He has 2 brothers but 1 does not live very close. -I discussed our resources to help with transportation if needed. -He notes increases in billing of his current treatment. I recommended he consult with his financial advocate Marguarite Arbour about this for more help. He is agreeable.    PLAN: -Labs reviewed and adequate to proceed with Cisplatin and Gemcitabine today with decrease gemcitabine back to 640m/m2 -Lab, f/u, and chemo in 2 weeks -continue Avatrombopag 423mbid   No problem-specific Assessment & Plan notes found for this encounter.   No orders of the defined types were placed in this encounter.  All questions were answered. The patient knows to call the clinic with any problems, questions or concerns. No barriers to learning was detected. I spent 20 minutes counseling the patient face to face. The total time spent in the appointment was 25  minutes and more than 50% was on counseling and review of test results     YaTruitt MerleMD 04/26/2019  I, AmJoslyn Devonam acting as scribe for YaTruitt MerleMD.   I have reviewed the above documentation for accuracy and completeness, and I agree with the above.

## 2019-04-26 ENCOUNTER — Encounter: Payer: Self-pay | Admitting: Hematology

## 2019-04-26 ENCOUNTER — Inpatient Hospital Stay: Payer: Medicare Other

## 2019-04-26 ENCOUNTER — Telehealth: Payer: Self-pay | Admitting: Hematology

## 2019-04-26 ENCOUNTER — Other Ambulatory Visit: Payer: Self-pay

## 2019-04-26 ENCOUNTER — Inpatient Hospital Stay (HOSPITAL_BASED_OUTPATIENT_CLINIC_OR_DEPARTMENT_OTHER): Payer: Medicare Other | Admitting: Hematology

## 2019-04-26 VITALS — BP 135/66 | HR 63 | Temp 98.7°F | Resp 18 | Ht 72.0 in | Wt 202.3 lb

## 2019-04-26 DIAGNOSIS — C229 Malignant neoplasm of liver, not specified as primary or secondary: Secondary | ICD-10-CM

## 2019-04-26 DIAGNOSIS — C221 Intrahepatic bile duct carcinoma: Secondary | ICD-10-CM

## 2019-04-26 DIAGNOSIS — K7030 Alcoholic cirrhosis of liver without ascites: Secondary | ICD-10-CM

## 2019-04-26 DIAGNOSIS — D696 Thrombocytopenia, unspecified: Secondary | ICD-10-CM

## 2019-04-26 DIAGNOSIS — Z5111 Encounter for antineoplastic chemotherapy: Secondary | ICD-10-CM | POA: Diagnosis not present

## 2019-04-26 LAB — CMP (CANCER CENTER ONLY)
ALT: 14 U/L (ref 0–44)
AST: 28 U/L (ref 15–41)
Albumin: 3.7 g/dL (ref 3.5–5.0)
Alkaline Phosphatase: 84 U/L (ref 38–126)
Anion gap: 9 (ref 5–15)
BUN: 9 mg/dL (ref 8–23)
CO2: 24 mmol/L (ref 22–32)
Calcium: 8.8 mg/dL — ABNORMAL LOW (ref 8.9–10.3)
Chloride: 104 mmol/L (ref 98–111)
Creatinine: 0.77 mg/dL (ref 0.61–1.24)
GFR, Est AFR Am: >60 mL/min (ref >60)
GFR, Estimated: >60 mL/min (ref >60)
Glucose, Bld: 91 mg/dL (ref 70–99)
Potassium: 4.1 mmol/L (ref 3.5–5.1)
Sodium: 137 mmol/L (ref 135–145)
Total Bilirubin: 0.5 mg/dL (ref 0.3–1.2)
Total Protein: 7.7 g/dL (ref 6.5–8.1)

## 2019-04-26 LAB — MAGNESIUM: Magnesium: 1.8 mg/dL (ref 1.7–2.4)

## 2019-04-26 LAB — CBC WITH DIFFERENTIAL (CANCER CENTER ONLY)
Abs Immature Granulocytes: 0.01 10*3/uL (ref 0.00–0.07)
Basophils Absolute: 0 10*3/uL (ref 0.0–0.1)
Basophils Relative: 0 %
Eosinophils Absolute: 0.1 10*3/uL (ref 0.0–0.5)
Eosinophils Relative: 2 %
HCT: 30.3 % — ABNORMAL LOW (ref 39.0–52.0)
Hemoglobin: 9.4 g/dL — ABNORMAL LOW (ref 13.0–17.0)
Immature Granulocytes: 0 %
Lymphocytes Relative: 12 %
Lymphs Abs: 0.3 10*3/uL — ABNORMAL LOW (ref 0.7–4.0)
MCH: 30.7 pg (ref 26.0–34.0)
MCHC: 31 g/dL (ref 30.0–36.0)
MCV: 99 fL (ref 80.0–100.0)
Monocytes Absolute: 0.3 10*3/uL (ref 0.1–1.0)
Monocytes Relative: 11 %
Neutro Abs: 2 10*3/uL (ref 1.7–7.7)
Neutrophils Relative %: 75 %
Platelet Count: 79 10*3/uL — ABNORMAL LOW (ref 150–400)
RBC: 3.06 MIL/uL — ABNORMAL LOW (ref 4.22–5.81)
RDW: 19.8 % — ABNORMAL HIGH (ref 11.5–15.5)
WBC Count: 2.6 10*3/uL — ABNORMAL LOW (ref 4.0–10.5)
nRBC: 0 % (ref 0.0–0.2)

## 2019-04-26 MED ORDER — SODIUM CHLORIDE 0.9 % IV SOLN
25.0000 mg/m2 | Freq: Once | INTRAVENOUS | Status: AC
  Administered 2019-04-26: 14:00:00 54 mg via INTRAVENOUS
  Filled 2019-04-26: qty 54

## 2019-04-26 MED ORDER — METHYLPREDNISOLONE SODIUM SUCC 40 MG IJ SOLR
60.0000 mg | Freq: Once | INTRAMUSCULAR | Status: AC
  Administered 2019-04-26: 60 mg via INTRAVENOUS

## 2019-04-26 MED ORDER — METHYLPREDNISOLONE SODIUM SUCC 40 MG IJ SOLR
INTRAMUSCULAR | Status: AC
  Filled 2019-04-26: qty 2

## 2019-04-26 MED ORDER — PALONOSETRON HCL INJECTION 0.25 MG/5ML
0.2500 mg | Freq: Once | INTRAVENOUS | Status: AC
  Administered 2019-04-26: 13:00:00 0.25 mg via INTRAVENOUS

## 2019-04-26 MED ORDER — PALONOSETRON HCL INJECTION 0.25 MG/5ML
INTRAVENOUS | Status: AC
  Filled 2019-04-26: qty 5

## 2019-04-26 MED ORDER — SODIUM CHLORIDE 0.9 % IV SOLN
Freq: Once | INTRAVENOUS | Status: DC
  Filled 2019-04-26: qty 250

## 2019-04-26 MED ORDER — POTASSIUM CHLORIDE 2 MEQ/ML IV SOLN
Freq: Once | INTRAVENOUS | Status: AC
  Administered 2019-04-26: 11:00:00 via INTRAVENOUS
  Filled 2019-04-26: qty 10

## 2019-04-26 MED ORDER — SODIUM CHLORIDE 0.9 % IV SOLN
600.0000 mg/m2 | Freq: Once | INTRAVENOUS | Status: AC
  Administered 2019-04-26: 1292 mg via INTRAVENOUS
  Filled 2019-04-26: qty 33.98

## 2019-04-26 MED ORDER — METHYLPREDNISOLONE SODIUM SUCC 125 MG IJ SOLR
INTRAMUSCULAR | Status: AC
  Filled 2019-04-26: qty 2

## 2019-04-26 MED ORDER — SODIUM CHLORIDE 0.9 % IV SOLN
Freq: Once | INTRAVENOUS | Status: AC
  Administered 2019-04-26: 10:00:00 via INTRAVENOUS
  Filled 2019-04-26: qty 250

## 2019-04-26 MED ORDER — SODIUM CHLORIDE 0.9 % IV SOLN: 600.0000 mg/m2 | Freq: Once | INTRAVENOUS | Status: DC

## 2019-04-26 NOTE — Progress Notes (Signed)
Per Dr. Burr Medico okay to treat with platelets 79, spoke with Leanne Chang RN Infusion

## 2019-04-26 NOTE — Progress Notes (Signed)
Pt is approved for the $700 [facility] grant.  °

## 2019-04-26 NOTE — Patient Instructions (Signed)
[facility] Discharge Instructions for Patients Receiving Chemotherapy  Today you received the following chemotherapy agents: Gemzar, Cisplatin  To help prevent nausea and vomiting after your treatment, we encourage you to take your nausea medication as directed.   If you develop nausea and vomiting that is not controlled by your nausea medication, call the clinic.   BELOW ARE SYMPTOMS THAT SHOULD BE REPORTED IMMEDIATELY:  *FEVER GREATER THAN 100.5 F  *CHILLS WITH OR WITHOUT FEVER  NAUSEA AND VOMITING THAT IS NOT CONTROLLED WITH YOUR NAUSEA MEDICATION  *UNUSUAL SHORTNESS OF BREATH  *UNUSUAL BRUISING OR BLEEDING  TENDERNESS IN MOUTH AND THROAT WITH OR WITHOUT PRESENCE OF ULCERS  *URINARY PROBLEMS  *BOWEL PROBLEMS  UNUSUAL RASH Items with * indicate a potential emergency and should be followed up as soon as possible.  Feel free to call the clinic should you have any questions or concerns. The clinic phone number is [phone].  Please show the [place] at check-in to the Emergency Department and triage nurse.

## 2019-04-26 NOTE — Telephone Encounter (Signed)
Per 8/20 los Abdominal MRI the last week of Sep and CT chest

## 2019-04-27 ENCOUNTER — Encounter: Payer: Self-pay | Admitting: Hematology

## 2019-04-27 LAB — CANCER ANTIGEN 19-9: CA 19-9: 34 U/mL (ref 0–35)

## 2019-05-07 NOTE — Progress Notes (Signed)
John [name]   [phone] Fax:([phone]   Clinic Follow up Note   Patient Care Team: Cletis Athens, MD as PCP - General (Internal Medicine) Manya Silvas, MD (Gastroenterology) Milinda Pointer, MD as Referring Physician (Pain Medicine)  Date of Service:  05/10/2019  CHIEF COMPLAINT: F/u ofadenocarcinoma in liver  SUMMARY OF ONCOLOGIC HISTORY: Oncology History  Cholangiocarcinoma ([place])  05/05/2018 Initial Biopsy   DIAGNOSIS: 05/05/18 A. LIVER MASS; ULTRASOUND-GUIDED BIOPSY:  - CYTOKERATIN 7 POSITIVE ADENOCARCINOMA WITH ABUNDANT NECROSIS, SEE  COMMENT.  Diagnosis 06/28/18 Consult- Comprehensive, [phone] Liver Bx - POORLY DIFFERENTIATED CARCINOMA INVOLVING LIVER PARENCHYMA. SEE NOTE. Diagnosis Note Most of the tumor shows a abortive glad formation, consistent with an adenocarcinoma. However, in some areas, the tumor cells show increased cytoplasm with a nested architecture, giving them a hepatoid appearance. Immunohistochemical stains performed at the outside institution show that the tumor cells are positive for CK7 and negative for TTF-1, Napsin-A, CDX-2, Hepar-1 and CD34. Immunostains performed at [facility] show that glypican-3 has patchy positive staining in the hepatoid-appearing tumor cells. Arginase and AFP stains show very focal, weak staining. Immunostain of pCEA shows focal canalicular-like staining pattern in the tumor cells. The findings are consistent with a cholangiocarcinoma but the the tumor histomorphology and staining pattern are suggestive of a hepatocellular carcinoma component, raising possibility of a combined hepatocellular cholangiocarcinoma. Additional biopsies or re-evaluation on the resection specimen is suggested for confirmation.   06/02/2018 PET scan   PET 06/02/18  IMPRESSION: 1. Solitary hypermetabolic lesion in the central LEFT hepatic lobe consistent with malignancy. 2. No metabolic activity remaining in LEFT  apical pulmonary nodule which is reduced in size. 3. No evidence of metastatic adenopathy on skull base to thigh FDG PET scan. 4. Cirrhotic liver.  Cholelithiasis 5.  Aortic Atherosclerosis (ICD10-I70.0).   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 Jearld Shines at [facility]  Estimated dose to liver target: 117 Gy Estimated dose to lung: 5.56 Gy Impression: Successful radioembolization with administration of intravascular yttrium-90 Theraspheres as described above.   10/18/2018 Imaging   US Abdomen 10/18/18 IMPRESSION: 1. Cholelithiasis without evidence of acute cholecystitis. 2. 2.2 cm left hepatic lobe mass consistent with known malignancy. 3. Cirrhosis and splenomegaly.   11/09/2018 Imaging   MRI Abdomen 11/09/18 at [facility]  Impression: Marked increased size of the mass centered in segment 4A with new satellite lesions seen in segments 2 and 3, likely representing multifocal mass-forming cholangiocarcinoma. New tumor invasion seen within the right anterior portal vein and middle hepatic vein to the level of the IVC, which is a pattern more commonly seen with hepatocellular carcinoma.   11/25/2018 Miscellaneous   Foundation One 3/21.2020  MSI -Stable - cannot be determined  Tumor Mutation Burden - cannot be determined  TERT promoter 124C>T TP53 [id number]   11/28/2018 Imaging   CT Chest 11/28/18  IMPRESSION: 1. Further improvement in left apical nodule, now nearly resolved. Mild surrounding radiation changes. 2. No new or enlarging pulmonary nodules. 3. Treated lesion in the left hepatic lobe is not optimally visualized on this noncontrast study, although appears slightly larger. 4. Progressive adenopathy in the upper abdomen. 5. Additional incidental findings are stable, including cirrhosis, splenomegaly, aortic valvular calcifications, Aortic Atherosclerosis (ICD10-I70.0) and Emphysema  (ICD10-J43.9).   12/25/2018 Cancer Staging   Staging form: Intrahepatic Bile Duct, AJCC 8th Edition - Clinical stage from 12/25/2018: Stage II (cT2(m), cN0, cM0) - Signed by [name], MD on  12/27/2018   01/24/2019 -  Chemotherapy   First line Cisplatin and Gemcitabine every 2 weeks starting 01/24/19  -He did have reaction to IV Emend and dexa with SOB, chest pain and flushing, will d/c and will switch dexa to Solu-Medrol on cycle 2.    03/28/2019 Imaging   MRI IMPRESSION: 1. Slight interval increase in size of the large necrotic central hepatic neoplasm. 2. Stable celiac axis lymph node and slightly enlarged left para-aortic, right hepatoduodenal ligament and epicardial lymph nodes. 3. 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 4. Stable splenomegaly.      CURRENT THERAPY:  ChemoGemcitabine and Cisplatinevery 2 weeks starting 01/24/19  INTERVAL HISTORY:  John [name] is here for a follow up and treatment. He presents to the clinic alone. He notes after cycle 6 he had nausea for 2.5 days. He did have antiemetics which improved his nausea. He feels his fatigue did not improve as time went. He was still abel to do everything for himself. He notes he has not been sleeping well last night. He feels this may be anxiety about coming to the clinic. He feels at times he is depressed. He also notes sharp right flank radiating to right abdominal dull pain. His pain is intermittent. He has been taking oxycodone 2.58m BID by his pain specialist. He feels this is not helping his right abdominal pain. He requested a refill for Baclofen which he takes 1-2 times a day as needed.  He notes after taking 444mof Doptelethe will feel shaky and jittery. He has been taking 2059mn the morning. He also feels this is related to keep him up at night.    REVIEW OF SYSTEMS:   Constitutional: Denies fevers, chills or abnormal weight loss (+) fatigue (+) trouble sleeping   Eyes: Denies blurriness of vision Ears, nose, mouth, throat, and face: Denies mucositis or sore throat Respiratory: Denies cough, dyspnea or wheezes Cardiovascular: Denies palpitation, chest discomfort or lower extremity swelling Gastrointestinal:  Denies nausea, heartburn or change in bowel habits (+) sharp right flank pain radiating to right abdominal dull pain Skin: Denies abnormal skin rashes Lymphatics: Denies new lymphadenopathy or easy bruising Neurological:Denies numbness, tingling or new weaknesses Behavioral/Psych: Mood is stable, no new changes  All other systems were reviewed with the patient and are negative.  MEDICAL HISTORY:  Past Medical History:  Diagnosis Date  . Abnormal MRI, lumbar spine (2015) 01/21/2016   FINDINGS:  L3-4: Tiny right foraminal and extra foraminal disc bulge adjacent to but not compressing the L3 nerve lateral to the neural foramen. L4-5: There is a small broad-based disc bulge. There is also hypertrophy of the ligamentum flavum and facet joints, left more than right creating moderately severe spinal stenosis and bilateral lateral recess stenosis, left greater than right. This appe  . Anxiety   . Back ache 05/06/2014  . Cervical pain 05/06/2014  . 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HCC[place]0/17/2016  . COPD (chronic obstructive pulmonary disease) ([place]CC[place] . De Quervain's tenosynovitis, left 07/07/2017  . Depression   . Dystonia   . Epileptic disorder ([place]CC[place] Gap0/17/2016  . Extremity pain 05/06/2014  . Febrile seizures (HCCSnyder  child  . Foot pain 09/11/2014  . Gastric ulcer 06/23/2015  . Gout   . H/O neoplasm 06/23/2015  . Head injury 06/23/2015  . Hemorrhoids   . Hepatic cirrhosis (HCCWauna0/17/2016  . Hepatitis B   . History of GI bleed   . Leukopenia   .  Liver cancer ([place]) 08/2018  . Neurogenic pain 01/21/2016  . Nodule of upper lobe of left lung 12/02/2017  . Non-small cell cancer of left lung ([place]) 2019   Rad tx's.   . Obstructive sleep apnea  06/23/2015  . 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 Osteoarthritis of spine with radiculopathy, lumbosacral region 06/23/2015  . Peripheral neuropathy 06/23/2015  . Peripheral neuropathy (Al[place]) 17/40/8144  . Personal history of tobacco use, presenting hazards to health 01/09/2016  . Pulse irregularity   . Seizures ([place])   . Spinal stenosis   . Splenomegaly 05/30/2013  . Thrombocytopenia ([place]) 06/23/2015  . Uncomplicated opioid dependence ([place]) 06/23/2015    SURGICAL HISTORY: Past Surgical History:  Procedure Laterality Date  . ESOPHAGOGASTRODUODENOSCOPY  11/2013  . ESOPHAGOGASTRODUODENOSCOPY (EGD) WITH PROPOFOL N/A 06/23/2018   Procedure: ESOPHAGOGASTRODUODENOSCOPY (EGD) WITH PROPOFOL;  Surgeon: Manya Silvas, MD;  Location: [facility] ENDOSCOPY;  Service: Endoscopy;  Laterality: N/A;  . surgery for cervical neck fracture    . TOOTH EXTRACTION Right 09/28/2016    I have reviewed the social history and family history with the patient and they are unchanged from previous note.  ALLERGIES:  is allergic to codeine; morphine; and duloxetine.  MEDICATIONS:  Current Outpatient Medications  Medication Sig Dispense Refill  . ALPRAZolam (XANAX) 0.25 MG tablet Take 0.25 mg by mouth at bedtime as needed for anxiety or sleep.     . Avatrombopag Maleate 20 MG TABS Take 40 mg by mouth daily. Take 3 tabs = 60 mg   daily 60 tablet 2  . baclofen (LIORESAL) 10 MG tablet Take 1 tablet (10 mg total) by mouth 3 (three) times daily. 270 tablet 0  . bisacodyl (DULCOLAX) 5 MG EC tablet Take 1 tablet (5 mg total) by mouth daily as needed for moderate constipation. 90 tablet 0  . cetirizine (ZYRTEC) 10 MG tablet Take 10 mg by mouth daily.  3  . dexamethasone (DECADRON) 4 MG tablet Take 1 tablet (4 mg total) by mouth daily. 10 tablet 0  . folic acid (FOLVITE) 1 MG tablet Take 1 mg by mouth daily.     Marland Kitchen gabapentin (NEURONTIN) 600 MG tablet Take 1 tablet (600 mg total) by  mouth 3 (three) times daily. 270 tablet 0  . Magnesium 500 MG CAPS Take 500 mg by mouth 2 (two) times a day.    Marland Kitchen omeprazole (PRILOSEC) 20 MG capsule Take 1 capsule (20 mg total) by mouth daily. 90 capsule 1  . ondansetron (ZOFRAN) 8 MG tablet Take 1 tablet (8 mg total) by mouth every 8 (eight) hours as needed for nausea or vomiting. 20 tablet 0  . oxyCODONE (OXY IR/ROXICODONE) 5 MG immediate release tablet Take 0.5 tablets (2.5 mg total) by mouth 2 (two) times daily. Must last 30 days 30 tablet 0  . [START ON 06/02/2019] oxyCODONE (OXY IR/ROXICODONE) 5 MG immediate release tablet Take 0.5 tablets (2.5 mg total) by mouth 2 (two) times daily. Must last 30 days 30 tablet 0  . [START ON 07/02/2019] oxyCODONE (OXY IR/ROXICODONE) 5 MG immediate release tablet Take 0.5 tablets (2.5 mg total) by mouth 2 (two) times daily. Must last 30 days 30 tablet 0  . prochlorperazine (COMPAZINE) 10 MG tablet Take 1 tablet (10 mg total) by mouth every 6 (six) hours as needed for nausea or vomiting. 30 tablet 0   No current facility-administered medications for this visit.    Facility-Administered Medications Ordered in Other Visits  Medication Dose Route Frequency Provider  Last Rate Last Dose  . 0.9 %  sodium chloride infusion   Intravenous Once [name], MD      . dextrose 5 % and 0.45% NaCl 1,000 mL with potassium chloride 20 mEq, magnesium sulfate 12 mEq infusion   Intravenous Once [name], MD        PHYSICAL EXAMINATION: ECOG PERFORMANCE STATUS: 2 - Symptomatic, <50% confined to bed  Vitals:   05/10/19 0903  BP: (!) 130/53  Pulse: (!) 56  Resp: 17  Temp: 98.5 F (36.9 C)  SpO2: 100%   Filed Weights   05/10/19 0903  Weight: 204 lb 6.4 oz (92.7 kg)    GENERAL:alert, no distress and comfortable SKIN: skin color, texture, turgor are normal, no rashes or significant lesions EYES: normal, Conjunctiva are pink and non-injected, sclera clear  NECK: supple, thyroid normal size, non-tender, without  nodularity LYMPH:  no palpable lymphadenopathy in the cervical, axillary  LUNGS: clear to auscultation and percussion with normal breathing effort HEART: regular rate & rhythm and no murmurs and no lower extremity edema ABDOMEN:abdomen soft, non-tender and normal bowel sounds Musculoskeletal:no cyanosis of digits and no clubbing  NEURO: alert & oriented x 3 with fluent speech, no focal motor/sensory deficits  LABORATORY DATA:  I have reviewed the data as listed CBC Latest Ref Rng & Units 05/10/2019 04/26/2019 04/12/2019  WBC 4.0 - 10.5 K/uL 2.8(L) 2.6(L) 3.3(L)  Hemoglobin 13.0 - 17.0 g/dL 8.9(L) 9.4(L) 9.9(L)  Hematocrit 39.0 - 52.0 % 29.3(L) 30.3(L) 32.3(L)  Platelets 150 - 400 K/uL 105(L) 79(L) 150     CMP Latest Ref Rng & Units 05/10/2019 04/26/2019 04/12/2019  Glucose 70 - 99 mg/dL 100(H) 91 101(H)  BUN 8 - 23 mg/dL 10 9 6(L)  Creatinine 0.61 - 1.24 mg/dL 0.75 0.77 0.80  Sodium 135 - 145 mmol/L 138 137 138  Potassium 3.5 - 5.1 mmol/L 4.0 4.1 4.1  Chloride 98 - 111 mmol/L 104 104 102  CO2 22 - 32 mmol/L '28 24 25  ' Calcium 8.9 - 10.3 mg/dL 8.8(L) 8.8(L) 9.0  Total Protein 6.5 - 8.1 g/dL 7.4 7.7 7.6  Total Bilirubin 0.3 - 1.2 mg/dL 0.5 0.5 0.5  Alkaline Phos 38 - 126 U/L 83 84 91  AST 15 - 41 U/L '26 28 25  ' ALT 0 - 44 U/L '12 14 11      ' RADIOGRAPHIC STUDIES: I have personally reviewed the radiological images as listed and agreed with the findings in the report. No results found.   ASSESSMENT & PLAN:  SYMEON PULEO is a 63 y.o. 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 His liver biopsy showed poorly differentiated adenocarcinoma, CK7 positive, other markers were negative. Upper endoscopy was negative for malignancy.PET scan otherwise negative for other primary tumor. -His biopsy was evaluated by our pathologistDr. Rogue Jury here, who feels this ismost consistent with cholangiocarcinoma,  with possible component of hepatocellular carcinoma. -CancertypeIDtestshowed 90% chance ofpancreaticobiliary primary -He was seen by surgeon Dr. Zenia Resides at [facility] in 07/2018 who did not recommend surgery butreferredhim to IR for target liver therapy. -He underwentY90 radio embolization on12/19/19with Dr. Jearld Shines at [facility].  -Given hislivertumor is non-resectable, it's incurable at this stagebut still treatable.I started him onfirst linechemocisplatin and gemcitabineon 5/20/20q2weeks with dose reduction,given COVID-19and his thrombocytopenia. He has increased to full dose when tolerable.  -03/2019 liver MRIshows stable disease/mild progression. Plan to repeat scan in 05/2019. If he hasfurther growth of his tumor,I will change his treatment regimen.  -  S/p cycle 6 his fatigue is taking longer to recover. I discussed his symptoms will start to accumulate the longer he is on chemo. For his nausea he will increase antiemetics and I will call in dexamethasone to use once a day for 2-3 days after chemo.  -Labs reviewed, CBC and CMP WNL except WBC 2.8, Hg 8.9, plt 105K, BG 100, Ca 8.8. Magnesium normal. Overall adequate to proceed with C7 Cisplatin and Gemcitabine today.  -F/u in 2 weeks, will repeat CT chest and abd MRI in 3-4 weeks    2.Hepatitis B and liver cirrhosis -He doesn't see a hepatologist nor an infectious disease physician,he has never received hepatitis B treatment. -will repeat Hep BS antigen, Santibody, core antibody, and a viral load -No varices on 06/2018 Endoscopy.  -I encouraged him to return to GI Dr. Tiffany Kocher for management.  3. 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 -Given he is on chemotherapy, Ipreviouslyprescribed him Doptelet (Avatrombopag) 37m BID.  -PNAT557Dtoday(05/10/19). He has feels jittery after taking 412m he has decreased Doptelet to 2067mID  himself   4. Left apical lungcancer stage IA,s/p SBRT, COPD -PET scanon 11/24/2017 revealed a hypermetabolic 1.0 cm left apical pulmonary nodulefavoring malignancy. Assuming non-small cell lung cancer this represents T1a N0 M0 disease (stage IA).Biopsy was not obtained -Underwent SBRT 01/05/18-02/01/18 -CT chest from 11/28/18 shows good improvement and no new nodules.  5. Social Support, FinAcupuncturiste lives by himself.  -He has 2 brothers but 1 does not live very close. -I discussed our resources to help with transportation if needed. -He notes increases in billing of his current treatment. I recommended he consult with his financial advocate ShaMarguarite Arbourout this for more help. He is agreeable.   6. Chronic back pain, right flank and RUQ pain -He sees Dr. NavDossie Arbourr pain management of chronic back pain. He has him on Oxycodone 2.5mg56mD. He is also on muscle relaxant Baclofen 10mg46mThis oxycodone dose is not enough for his abdominal pain related to his liver metastasis.  -I will contact Dr. NaveiDossie Arbourt adjusting dose to also include his cancer related pain.   7. Nausea  -Secondary to chemo  -Will increase antiemetics. I called in Dexamethasone 4mg t72make once in the morning as needed.   PLAN: -I called in dexamethasone today, he will take 4mg da2m for 2-3 days after chemo   -Labs reviewed and adequate to proceed with Gemcitabine/Cisplatin today  -Lab, f/u and Gemcitabine/Cisplatin  In 2 weeks  -Send message to Dr. NaveiraDossie Arbourcreasing his pain meds   No problem-specific Assessment & Plan notes found for this encounter.   Orders Placed This Encounter  Procedures  . CT Chest Wo Contrast    Standing Status:   Future    Standing Expiration Date:   05/09/2020    Order Specific Question:   Preferred imaging location?    Answer:   [facility]    Order Specific Question:   Radiology Contrast Protocol - do NOT remove file path    Answer:    \\[email or web address]\[email or web address].pdf  . MR Abdomen W Wo Contrast    Standing Status:   Future    Standing Expiration Date:   07/09/2020    Order Specific Question:   If indicated for the ordered procedure, I authorize the administration of contrast media per Radiology protocol    Answer:   Yes    Order Specific Question:   What is the  patient's sedation requirement?    Answer:   No Sedation    Order Specific Question:   Does the patient have a pacemaker or implanted devices?    Answer:   No    Order Specific Question:   Radiology Contrast Protocol - do NOT remove file path    Answer:   \\[email or web address]\[email or web address]    Order Specific Question:   Preferred imaging location?    Answer:   [facility] (table limit-350lbs)   All questions were answered. The patient knows to call the clinic with any problems, questions or concerns. No barriers to learning was detected. I spent 20 minutes counseling the patient face to face. The total time spent in the appointment was 25 minutes and more than 50% was on counseling and review of test results     [name], MD 05/10/2019   I, Joslyn Devon, am acting as scribe for [name], MD.   I have reviewed the above documentation for accuracy and completeness, and I agree with the above.

## 2019-05-10 ENCOUNTER — Inpatient Hospital Stay: Payer: Medicare Other

## 2019-05-10 ENCOUNTER — Inpatient Hospital Stay (HOSPITAL_BASED_OUTPATIENT_CLINIC_OR_DEPARTMENT_OTHER): Payer: Medicare Other | Admitting: Hematology

## 2019-05-10 ENCOUNTER — Telehealth: Payer: Self-pay | Admitting: Hematology

## 2019-05-10 ENCOUNTER — Inpatient Hospital Stay: Payer: Medicare Other | Attending: Hematology

## 2019-05-10 ENCOUNTER — Other Ambulatory Visit: Payer: Self-pay

## 2019-05-10 ENCOUNTER — Encounter: Payer: Self-pay | Admitting: Hematology

## 2019-05-10 VITALS — BP 153/51 | HR 67 | Temp 97.9°F | Resp 18

## 2019-05-10 VITALS — BP 130/53 | HR 56 | Temp 98.5°F | Resp 17 | Ht 72.0 in | Wt 204.4 lb

## 2019-05-10 DIAGNOSIS — D696 Thrombocytopenia, unspecified: Secondary | ICD-10-CM

## 2019-05-10 DIAGNOSIS — R5383 Other fatigue: Secondary | ICD-10-CM | POA: Insufficient documentation

## 2019-05-10 DIAGNOSIS — C221 Intrahepatic bile duct carcinoma: Secondary | ICD-10-CM

## 2019-05-10 DIAGNOSIS — R1011 Right upper quadrant pain: Secondary | ICD-10-CM | POA: Diagnosis not present

## 2019-05-10 DIAGNOSIS — Z79899 Other long term (current) drug therapy: Secondary | ICD-10-CM | POA: Insufficient documentation

## 2019-05-10 DIAGNOSIS — K7460 Unspecified cirrhosis of liver: Secondary | ICD-10-CM | POA: Diagnosis not present

## 2019-05-10 DIAGNOSIS — G8929 Other chronic pain: Secondary | ICD-10-CM | POA: Insufficient documentation

## 2019-05-10 DIAGNOSIS — R110 Nausea: Secondary | ICD-10-CM | POA: Insufficient documentation

## 2019-05-10 DIAGNOSIS — Z85118 Personal history of other malignant neoplasm of bronchus and lung: Secondary | ICD-10-CM

## 2019-05-10 DIAGNOSIS — Z23 Encounter for immunization: Secondary | ICD-10-CM | POA: Diagnosis not present

## 2019-05-10 DIAGNOSIS — M549 Dorsalgia, unspecified: Secondary | ICD-10-CM | POA: Insufficient documentation

## 2019-05-10 DIAGNOSIS — D6959 Other secondary thrombocytopenia: Secondary | ICD-10-CM | POA: Diagnosis not present

## 2019-05-10 DIAGNOSIS — K7030 Alcoholic cirrhosis of liver without ascites: Secondary | ICD-10-CM | POA: Diagnosis not present

## 2019-05-10 DIAGNOSIS — Z5111 Encounter for antineoplastic chemotherapy: Secondary | ICD-10-CM | POA: Insufficient documentation

## 2019-05-10 DIAGNOSIS — C229 Malignant neoplasm of liver, not specified as primary or secondary: Secondary | ICD-10-CM

## 2019-05-10 LAB — MAGNESIUM: Magnesium: 1.9 mg/dL (ref 1.7–2.4)

## 2019-05-10 LAB — CBC WITH DIFFERENTIAL (CANCER CENTER ONLY)
Abs Immature Granulocytes: 0.01 10*3/uL (ref 0.00–0.07)
Basophils Absolute: 0 10*3/uL (ref 0.0–0.1)
Basophils Relative: 0 %
Eosinophils Absolute: 0 10*3/uL (ref 0.0–0.5)
Eosinophils Relative: 1 %
HCT: 29.3 % — ABNORMAL LOW (ref 39.0–52.0)
Hemoglobin: 8.9 g/dL — ABNORMAL LOW (ref 13.0–17.0)
Immature Granulocytes: 0 %
Lymphocytes Relative: 10 %
Lymphs Abs: 0.3 10*3/uL — ABNORMAL LOW (ref 0.7–4.0)
MCH: 30.9 pg (ref 26.0–34.0)
MCHC: 30.4 g/dL (ref 30.0–36.0)
MCV: 101.7 fL — ABNORMAL HIGH (ref 80.0–100.0)
Monocytes Absolute: 0.5 10*3/uL (ref 0.1–1.0)
Monocytes Relative: 16 %
Neutro Abs: 2 10*3/uL (ref 1.7–7.7)
Neutrophils Relative %: 73 %
Platelet Count: 105 10*3/uL — ABNORMAL LOW (ref 150–400)
RBC: 2.88 MIL/uL — ABNORMAL LOW (ref 4.22–5.81)
RDW: 19.9 % — ABNORMAL HIGH (ref 11.5–15.5)
WBC Count: 2.8 10*3/uL — ABNORMAL LOW (ref 4.0–10.5)
nRBC: 0 % (ref 0.0–0.2)

## 2019-05-10 LAB — CMP (CANCER CENTER ONLY)
ALT: 12 U/L (ref 0–44)
AST: 26 U/L (ref 15–41)
Albumin: 3.6 g/dL (ref 3.5–5.0)
Alkaline Phosphatase: 83 U/L (ref 38–126)
Anion gap: 6 (ref 5–15)
BUN: 10 mg/dL (ref 8–23)
CO2: 28 mmol/L (ref 22–32)
Calcium: 8.8 mg/dL — ABNORMAL LOW (ref 8.9–10.3)
Chloride: 104 mmol/L (ref 98–111)
Creatinine: 0.75 mg/dL (ref 0.61–1.24)
GFR, Est AFR Am: >60 mL/min (ref >60)
GFR, Estimated: >60 mL/min (ref >60)
Glucose, Bld: 100 mg/dL — ABNORMAL HIGH (ref 70–99)
Potassium: 4 mmol/L (ref 3.5–5.1)
Sodium: 138 mmol/L (ref 135–145)
Total Bilirubin: 0.5 mg/dL (ref 0.3–1.2)
Total Protein: 7.4 g/dL (ref 6.5–8.1)

## 2019-05-10 MED ORDER — SODIUM CHLORIDE 0.9 % IV SOLN
Freq: Once | INTRAVENOUS | Status: AC
  Administered 2019-05-10: 14:00:00 via INTRAVENOUS
  Filled 2019-05-10: qty 250

## 2019-05-10 MED ORDER — DEXAMETHASONE 4 MG PO TABS: 4.0000 mg | ORAL_TABLET | Freq: Every day | ORAL | 0 refills | Status: DC

## 2019-05-10 MED ORDER — SODIUM CHLORIDE 0.9 % IV SOLN
25.0000 mg/m2 | Freq: Once | INTRAVENOUS | Status: AC
  Administered 2019-05-10: 14:00:00 54 mg via INTRAVENOUS
  Filled 2019-05-10: qty 54

## 2019-05-10 MED ORDER — SODIUM CHLORIDE 0.9 % IV SOLN
600.0000 mg/m2 | Freq: Once | INTRAVENOUS | Status: AC
  Administered 2019-05-10: 1292 mg via INTRAVENOUS
  Filled 2019-05-10: qty 33.98

## 2019-05-10 MED ORDER — POTASSIUM CHLORIDE 2 MEQ/ML IV SOLN
Freq: Once | INTRAVENOUS | Status: AC
  Administered 2019-05-10: 11:00:00 via INTRAVENOUS
  Filled 2019-05-10: qty 10

## 2019-05-10 MED ORDER — PALONOSETRON HCL INJECTION 0.25 MG/5ML
INTRAVENOUS | Status: AC
  Filled 2019-05-10: qty 5

## 2019-05-10 MED ORDER — METHYLPREDNISOLONE SODIUM SUCC 40 MG IJ SOLR
60.0000 mg | Freq: Once | INTRAMUSCULAR | Status: AC
  Administered 2019-05-10: 13:00:00 60 mg via INTRAVENOUS

## 2019-05-10 MED ORDER — SODIUM CHLORIDE 0.9 % IV SOLN
Freq: Once | INTRAVENOUS | Status: AC
  Administered 2019-05-10: 10:00:00 via INTRAVENOUS
  Filled 2019-05-10: qty 250

## 2019-05-10 MED ORDER — METHYLPREDNISOLONE SODIUM SUCC 40 MG IJ SOLR
INTRAMUSCULAR | Status: AC
  Filled 2019-05-10: qty 1

## 2019-05-10 MED ORDER — PALONOSETRON HCL INJECTION 0.25 MG/5ML
0.2500 mg | Freq: Once | INTRAVENOUS | Status: AC
  Administered 2019-05-10: 0.25 mg via INTRAVENOUS

## 2019-05-10 NOTE — Telephone Encounter (Signed)
Per 9/3 los CT chest and ABD MRI in 3-4 weeks

## 2019-05-10 NOTE — Patient Instructions (Signed)
[facility] Discharge Instructions for Patients Receiving Chemotherapy  Today you received the following chemotherapy agents Gemcitabine (GEMZAR) & Cisplatin (PLATINOL).  To help prevent nausea and vomiting after your treatment, we encourage you to take your nausea medication as prescribed.   If you develop nausea and vomiting that is not controlled by your nausea medication, call the clinic.   BELOW ARE SYMPTOMS THAT SHOULD BE REPORTED IMMEDIATELY:  *FEVER GREATER THAN 100.5 F  *CHILLS WITH OR WITHOUT FEVER  NAUSEA AND VOMITING THAT IS NOT CONTROLLED WITH YOUR NAUSEA MEDICATION  *UNUSUAL SHORTNESS OF BREATH  *UNUSUAL BRUISING OR BLEEDING  TENDERNESS IN MOUTH AND THROAT WITH OR WITHOUT PRESENCE OF ULCERS  *URINARY PROBLEMS  *BOWEL PROBLEMS  UNUSUAL RASH Items with * indicate a potential emergency and should be followed up as soon as possible.  Feel free to call the clinic should you have any questions or concerns. The clinic phone number is [phone].  Please show the [place] at check-in to the Emergency Department and triage nurse.  Coronavirus (COVID-19) Are you at risk?  Are you at risk for the Coronavirus (COVID-19)?  To be considered HIGH RISK for Coronavirus (COVID-19), you have to meet the following criteria:  . Traveled to [place], [place], [place], [place] or [place]; or in the [place] to [place], [place], [place], or [place]; and have fever, cough, and shortness of breath within the last 2 weeks of travel OR . Been in close contact with a person diagnosed with COVID-19 within the last 2 weeks and have fever, cough, and shortness of breath . IF YOU DO NOT MEET THESE CRITERIA, YOU ARE CONSIDERED LOW RISK FOR COVID-19.  What to do if you are HIGH RISK for COVID-19?  Marland Kitchen If you are having a medical emergency, call 911. . Seek medical care right away. Before you go to a doctor's office, urgent care or emergency department, call  ahead and tell them about your recent travel, contact with someone diagnosed with COVID-19, and your symptoms. You should receive instructions from your physician's office regarding next steps of care.  . When you arrive at healthcare provider, tell the healthcare staff immediately you have returned from visiting [place], [place], [place], [place] or [place]; or traveled in the [place] to [place], [place], [place], or [place]; in the last two weeks or you have been in close contact with a person diagnosed with COVID-19 in the last 2 weeks.   . Tell the health care staff about your symptoms: fever, cough and shortness of breath. . After you have been seen by a medical provider, you will be either: o Tested for (COVID-19) and discharged home on quarantine except to seek medical care if symptoms worsen, and asked to  - Stay home and avoid contact with others until you get your results (4-5 days)  - Avoid travel on public transportation if possible (such as bus, train, or airplane) or o Sent to the Emergency Department by EMS for evaluation, COVID-19 testing, and possible admission depending on your condition and test results.  What to do if you are LOW RISK for COVID-19?  Reduce your risk of any infection by using the same precautions used for avoiding the common cold or flu:  Marland Kitchen Wash your hands often with soap and warm water for at least 20 seconds.  If soap and water are not readily available, use an alcohol-based hand sanitizer with at least 60% alcohol.  Marland Kitchen  If coughing or sneezing, cover your mouth and nose by coughing or sneezing into the elbow areas of your shirt or coat, into a tissue or into your sleeve (not your hands). . Avoid shaking hands with others and consider head nods or verbal greetings only. . Avoid touching your eyes, nose, or mouth with unwashed hands.  . Avoid close contact with people who are sick. . Avoid places or events with large numbers of people in one location,  like concerts or sporting events. . Carefully consider travel plans you have or are making. . If you are planning any travel outside or inside the [place], visit the CDC's Travelers' Health webpage for the latest health notices. . If you have some symptoms but not all symptoms, continue to monitor at home and seek medical attention if your symptoms worsen. . If you are having a medical emergency, call 911.   [place] / e-Visit: [email or web address]         [facility]: [facility]: [phone]                   [facility]: [phone]

## 2019-05-14 ENCOUNTER — Other Ambulatory Visit: Payer: Self-pay | Admitting: Pain Medicine

## 2019-05-14 DIAGNOSIS — M792 Neuralgia and neuritis, unspecified: Secondary | ICD-10-CM

## 2019-05-15 ENCOUNTER — Inpatient Hospital Stay: Payer: Medicare Other

## 2019-05-15 ENCOUNTER — Encounter: Payer: Self-pay | Admitting: Hematology

## 2019-05-15 NOTE — Progress Notes (Signed)
Nutrition Assessment:  Patient identified on Malnutrition Screening report for weight loss and poor appetite  Patient with adenocarcinoma of the liver, proable cholangiocarcinoma.  Past medical history of hepatitis B, liver cirrhosis, left lung cancer, chronic back pain.Patient receiving chemotherapy.   Patient reports that he has a good appetite.  Does report nausea and medications have been adjusted.  Reports typically has oatmeal and coffee or breakfast bar or bacon, egg and cheese biscuit for breakfast then does not eat again until around 4pm.  Intake varies sandwiches (mostly tomato lately).  Does report constipation from pain medications.    Medications: dulcolax, decadron, folic acid, mag, prilosec, zofran,  compazine  Labs: glucose 100  Anthropometrics:   Height: 72 inches Weight: 204 lb 6.4 oz UBW: 211 lb (01/2019) BMI: 27  3% weight loss in the last 4 months, not significant  NUTRITION DIAGNOSIS: Unintentional weight loss related to cancer and cancer related treatment side effects as evidenced by 3% weight loss in the last 4 months    INTERVENTION:  Discussed importance of well balanced nutrition. Encouraged good sources of protein and examples provided Discussed strategies to help with constipation.  Encouraged continuing medication regimen as well.  Contact information provided    MONITORING, EVALUATION, GOAL: Patient will consume adequate calories and protein to maintain lean muscle mass   NEXT VISIT: patient to contact   John [name], [place], [place] Registered Dietitian [phone] (pager)

## 2019-05-15 NOTE — Progress Notes (Signed)
Patient called with some concerns to discuss.  He did not receive gas card from last visit. Advised patient it was left at front desk but I would put in mail to be sent to his home.  He had questions regarding other expenses grant could be used for. Went over them in detail and how they are submitted. Placed a W-9 in mail as well for car repair and advised the invoice would be needed for processing.  He had questions about the [facility] FAA application. Went over what was needed to submit with application and advised the amount of his total balance must be $5000 in order to apply. Advised him to contact the number on the bill to discuss with billing if this is still an option for him.  Asked patient if he completed Medicaid application to see if he qualifies for assistance. He states he has not. Advised he can complete and submit to DSS in his county if he is interested. There is also a number on the application which he can apply via telephone.    He has my card for any additional financial questions or concerns.

## 2019-05-21 NOTE — Progress Notes (Signed)
[place]   Telephone:([phone] Fax:([phone]) [phone]   Clinic Follow up Note   Patient Care Team: Cletis Athens, MD as PCP - General (Internal Medicine) Manya Silvas, MD (Gastroenterology) Milinda Pointer, MD as Referring Physician (Pain Medicine)  Date of Service:  05/24/2019  CHIEF COMPLAINT: F/u ofadenocarcinoma in liver  SUMMARY OF ONCOLOGIC HISTORY: Oncology History  Cholangiocarcinoma ([place])  05/05/2018 Initial Biopsy   DIAGNOSIS: 05/05/18 A. LIVER MASS; ULTRASOUND-GUIDED BIOPSY:  - CYTOKERATIN 7 POSITIVE ADENOCARCINOMA WITH ABUNDANT NECROSIS, SEE  COMMENT.  Diagnosis 06/28/18 Consult- Comprehensive, [id number] Liver Bx - POORLY DIFFERENTIATED CARCINOMA INVOLVING LIVER PARENCHYMA. SEE NOTE. Diagnosis Note Most of the tumor shows a abortive glad formation, consistent with an adenocarcinoma. However, in some areas, the tumor cells show increased cytoplasm with a nested architecture, giving them a hepatoid appearance. Immunohistochemical stains performed at the outside institution show that the tumor cells are positive for CK7 and negative for TTF-1, Napsin-A, CDX-2, Hepar-1 and CD34. Immunostains performed at [facility] show that glypican-3 has patchy positive staining in the hepatoid-appearing tumor cells. Arginase and AFP stains show very focal, weak staining. Immunostain of pCEA shows focal canalicular-like staining pattern in the tumor cells. The findings are consistent with a cholangiocarcinoma but the the tumor histomorphology and staining pattern are suggestive of a hepatocellular carcinoma component, raising possibility of a combined hepatocellular cholangiocarcinoma. Additional biopsies or re-evaluation on the resection specimen is suggested for confirmation.   06/02/2018 PET scan   PET 06/02/18  IMPRESSION: 1. Solitary hypermetabolic lesion in the central LEFT hepatic lobe consistent with malignancy. 2. No metabolic activity remaining in LEFT  apical pulmonary nodule which is reduced in size. 3. No evidence of metastatic adenopathy on skull base to thigh FDG PET scan. 4. Cirrhotic liver.  Cholelithiasis 5.  Aortic Atherosclerosis (ICD10-I70.0).   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 Jearld Shines at [facility]  Estimated dose to liver target: 117 Gy Estimated dose to lung: 5.56 Gy Impression: Successful radioembolization with administration of intravascular yttrium-90 Theraspheres as described above.   10/18/2018 Imaging   US Abdomen 10/18/18 IMPRESSION: 1. Cholelithiasis without evidence of acute cholecystitis. 2. 2.2 cm left hepatic lobe mass consistent with known malignancy. 3. Cirrhosis and splenomegaly.   11/09/2018 Imaging   MRI Abdomen 11/09/18 at [facility]  Impression: Marked increased size of the mass centered in segment 4A with new satellite lesions seen in segments 2 and 3, likely representing multifocal mass-forming cholangiocarcinoma. New tumor invasion seen within the right anterior portal vein and middle hepatic vein to the level of the IVC, which is a pattern more commonly seen with hepatocellular carcinoma.   11/25/2018 Miscellaneous   Foundation One 3/21.2020  MSI -Stable - cannot be determined  Tumor Mutation Burden - cannot be determined  TERT promoter 124C>T TP53 [id number]   11/28/2018 Imaging   CT Chest 11/28/18  IMPRESSION: 1. Further improvement in left apical nodule, now nearly resolved. Mild surrounding radiation changes. 2. No new or enlarging pulmonary nodules. 3. Treated lesion in the left hepatic lobe is not optimally visualized on this noncontrast study, although appears slightly larger. 4. Progressive adenopathy in the upper abdomen. 5. Additional incidental findings are stable, including cirrhosis, splenomegaly, aortic valvular calcifications, Aortic Atherosclerosis (ICD10-I70.0) and Emphysema  (ICD10-J43.9).   12/25/2018 Cancer Staging   Staging form: Intrahepatic Bile Duct, AJCC 8th Edition - Clinical stage from 12/25/2018: Stage II (cT2(m), cN0, cM0) - Signed by [name], MD on  12/27/2018   01/24/2019 -  Chemotherapy   First line Cisplatin and Gemcitabine every 2 weeks starting 01/24/19  -He did have reaction to IV Emend and dexa with SOB, chest pain and flushing, will d/c and will switch dexa to Solu-Medrol on cycle 2.    03/28/2019 Imaging   MRI IMPRESSION: 1. Slight interval increase in size of the large necrotic central hepatic neoplasm. 2. Stable celiac axis lymph node and slightly enlarged left para-aortic, right hepatoduodenal ligament and epicardial lymph nodes. 3. 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 4. Stable splenomegaly.      CURRENT THERAPY:  ChemoGemcitabine and Cisplatinevery 2 weeks starting 01/24/19  INTERVAL HISTORY:  John [name] is here for a follow up and treatment. He presents to the clinic alone. He did not tolerate last cycle chemo well, had mouth sore since a week ago, near resolved now.   Intermittent mild upper abdominal pain, tolerable.   Occasional rectal bleed with hard stool, he has hemoroids. No other bleeding.   Still has moderate fatigue, appetite low, weight stable    REVIEW OF SYSTEMS:   Constitutional: Denies fevers, chills or abnormal weight loss, (+) fatigue  Eyes: Denies blurriness of vision Ears, nose, mouth, throat, and face: (+) resolved mouth sore  Respiratory: Denies cough, dyspnea or wheezes Cardiovascular: Denies palpitation, chest discomfort or lower extremity swelling Gastrointestinal:  Denies nausea, heartburn or change in bowel habits Skin: Denies abnormal skin rashes Lymphatics: Denies new lymphadenopathy or easy bruising Neurological:Denies numbness, tingling or new weaknesses Behavioral/Psych: Mood is stable, no new changes  All other systems were reviewed  with the patient and are negative.  MEDICAL HISTORY:  Past Medical History:  Diagnosis Date  . Abnormal MRI, lumbar spine (2015) 01/21/2016   FINDINGS:  L3-4: Tiny right foraminal and extra foraminal disc bulge adjacent to but not compressing the L3 nerve lateral to the neural foramen. L4-5: There is a small broad-based disc bulge. There is also hypertrophy of the ligamentum flavum and facet joints, left more than right creating moderately severe spinal stenosis and bilateral lateral recess stenosis, left greater than right. This appe  . Anxiety   . Back ache 05/06/2014  . Cervical pain 05/06/2014  . 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[place]) 06/23/2015  . COPD (chronic obstructive pulmonary disease) ([place])   . De Quervain's tenosynovitis, left 07/07/2017  . Depression   . Dystonia   . Epileptic disorder ([place]) 06/23/2015  . Extremity pain 05/06/2014  . Febrile seizures ([place])    child  . Foot pain 09/11/2014  . Gastric ulcer 06/23/2015  . Gout   . H/O neoplasm 06/23/2015  . Head injury 06/23/2015  . Hemorrhoids   . Hepatic cirrhosis ([place]) 06/23/2015  . Hepatitis B   . History of GI bleed   . Leukopenia   . Liver cancer ([place]) 08/2018  . Neurogenic pain 01/21/2016  . Nodule of upper lobe of left lung 12/02/2017  . Non-small cell cancer of left lung ([place]) 2019   Rad tx's.   . Obstructive sleep apnea 06/23/2015  . 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 Osteoarthritis of spine with radiculopathy, lumbosacral region 06/23/2015  . Peripheral neuropathy 06/23/2015  . Peripheral neuropathy ([place]) 38/75/6433  . Personal history of tobacco use, presenting hazards to health 01/09/2016  . Pulse irregularity   . Seizures ([place])   . Spinal stenosis   . Splenomegaly 05/30/2013  . Thrombocytopenia ([place]) 06/23/2015  . Uncomplicated opioid dependence (  [place]) 06/23/2015    SURGICAL HISTORY: Past Surgical History:  Procedure Laterality Date  . ESOPHAGOGASTRODUODENOSCOPY   11/2013  . ESOPHAGOGASTRODUODENOSCOPY (EGD) WITH PROPOFOL N/A 06/23/2018   Procedure: ESOPHAGOGASTRODUODENOSCOPY (EGD) WITH PROPOFOL;  Surgeon: Manya Silvas, MD;  Location[facility] ENDOSCOPY;  Service: Endoscopy;  Laterality: N/A;  . surgery for cervical neck fracture    . TOOTH EXTRACTION Right 09/28/2016    I have reviewed the social history and family history with the patient and they are unchanged from previous note.  ALLERGIES:  is allergic to codeine; morphine; and duloxetine.  MEDICATIONS:  Current Outpatient Medications  Medication Sig Dispense Refill  . ALPRAZolam (XANAX) 0.25 MG tablet Take 0.25 mg by mouth at bedtime as needed for anxiety or sleep.     . Avatrombopag Maleate 20 MG TABS Take 40 mg by mouth daily. Take 3 tabs = 60 mg   daily 60 tablet 2  . baclofen (LIORESAL) 10 MG tablet Take 1 tablet (10 mg total) by mouth 3 (three) times daily. 270 tablet 0  . bisacodyl (DULCOLAX) 5 MG EC tablet Take 1 tablet (5 mg total) by mouth daily as needed for moderate constipation. 90 tablet 0  . cetirizine (ZYRTEC) 10 MG tablet Take 10 mg by mouth daily.  3  . dexamethasone (DECADRON) 4 MG tablet Take 1 tablet (4 mg total) by mouth daily. 10 tablet 0  . folic acid (FOLVITE) 1 MG tablet Take 1 mg by mouth daily.     Marland Kitchen gabapentin (NEURONTIN) 600 MG tablet Take 1 tablet (600 mg total) by mouth 3 (three) times daily. 270 tablet 0  . Magnesium 500 MG CAPS Take 500 mg by mouth 2 (two) times a day.    Marland Kitchen omeprazole (PRILOSEC) 20 MG capsule Take 1 capsule (20 mg total) by mouth daily. 90 capsule 1  . ondansetron (ZOFRAN) 8 MG tablet Take 1 tablet (8 mg total) by mouth every 8 (eight) hours as needed for nausea or vomiting. 20 tablet 0  . oxyCODONE (OXY IR/ROXICODONE) 5 MG immediate release tablet Take 0.5 tablets (2.5 mg total) by mouth 2 (two) times daily. Must last 30 days 30 tablet 0  . [START ON 06/02/2019] oxyCODONE (OXY IR/ROXICODONE) 5 MG immediate release tablet Take 0.5 tablets (2.5 mg  total) by mouth 2 (two) times daily. Must last 30 days 30 tablet 0  . [START ON 07/02/2019] oxyCODONE (OXY IR/ROXICODONE) 5 MG immediate release tablet Take 0.5 tablets (2.5 mg total) by mouth 2 (two) times daily. Must last 30 days 30 tablet 0  . prochlorperazine (COMPAZINE) 10 MG tablet Take 1 tablet (10 mg total) by mouth every 6 (six) hours as needed for nausea or vomiting. 30 tablet 0   No current facility-administered medications for this visit.     PHYSICAL EXAMINATION: ECOG PERFORMANCE STATUS: 2 - Symptomatic, <50% confined to bed  Vitals:   05/24/19 0909 05/24/19 0910  BP: (!) 130/44 115/60  Pulse: 75   Resp: 18   Temp: 98.2 F (36.8 C)   SpO2: 99%    Filed Weights   05/24/19 0909  Weight: 208 lb (94.3 kg)   GENERAL:alert, no distress and comfortable SKIN: skin color, texture, turgor are normal, no rashes or significant lesions EYES: normal, Conjunctiva are pink and non-injected, sclera clear  NECK: supple, thyroid normal size, non-tender, without nodularity LYMPH:  no palpable lymphadenopathy in the cervical, axillary  LUNGS: clear to auscultation and percussion with normal breathing effort HEART: regular rate & rhythm and no murmurs  and no lower extremity edema ABDOMEN:abdomen soft, non-tender and normal bowel sounds NEURO: alert & oriented x 3 with fluent speech, no focal motor/sensory deficits  LABORATORY DATA:  I have reviewed the data as listed CBC Latest Ref Rng & Units 05/24/2019 05/10/2019 04/26/2019  WBC 4.0 - 10.5 K/uL 2.7(L) 2.8(L) 2.6(L)  Hemoglobin 13.0 - 17.0 g/dL 8.7(L) 8.9(L) 9.4(L)  Hematocrit 39.0 - 52.0 % 28.0(L) 29.3(L) 30.3(L)  Platelets 150 - 400 K/uL 70(L) 105(L) 79(L)     CMP Latest Ref Rng & Units 05/24/2019 05/10/2019 04/26/2019  Glucose 70 - 99 mg/dL 102(H) 100(H) 91  BUN 8 - 23 mg/dL '10 10 9  ' Creatinine 0.61 - 1.24 mg/dL 0.76 0.75 0.77  Sodium 135 - 145 mmol/L 139 138 137  Potassium 3.5 - 5.1 mmol/L 4.0 4.0 4.1  Chloride 98 - 111 mmol/L  105 104 104  CO2 22 - 32 mmol/L '28 28 24  ' Calcium 8.9 - 10.3 mg/dL 8.7(L) 8.8(L) 8.8(L)  Total Protein 6.5 - 8.1 g/dL 7.0 7.4 7.7  Total Bilirubin 0.3 - 1.2 mg/dL 0.5 0.5 0.5  Alkaline Phos 38 - 126 U/L 83 83 84  AST 15 - 41 U/L '23 26 28  ' ALT 0 - 44 U/L '12 12 14      ' RADIOGRAPHIC STUDIES: I have personally reviewed the radiological images as listed and agreed with the findings in the report. No results found.   ASSESSMENT & PLAN:  John [name] is a 63 y.o. 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 His liver biopsy showed poorly differentiated adenocarcinoma, CK7 positive, other markers were negative. Upper endoscopy was negative for malignancy.PET scan otherwise negative for other primary tumor. -His biopsy was evaluated by our pathologistDr. Rogue Jury here, who feels this ismost consistent with cholangiocarcinoma, with possible component of hepatocellular carcinoma. -CancertypeIDtestshowed 70% chance ofpancreaticobiliary primary -He was seen by surgeon Dr. Zenia Resides at [facility] in 07/2018 who did not recommend surgery butreferredhim to IR for target liver therapy. -He underwentY90 radio embolization on12/19/19with Dr. Jearld Shines at [facility].  -Given hislivertumor is non-resectable, it's incurable at this stagebut still treatable.I started him onfirst linechemocisplatin and gemcitabineon 5/20/20q2weekswith dose reduction,given COVID-19and his thrombocytopenia. He is tolerating moderately well -03/2019 liver MRIshows stable disease/mild progression. Plan to repeat scan in 05/2019. If he hasfurther growth of his tumor,I will change his treatment regimen.  -he has been having more side effects from chemo.  Lab reviewed, platelet 70K, given his recent muscle, fatigue, and thrombocytopenia, will postpone his chemo to next week. -We will repeat CT chest and abdominal MRI in 2 to 3 weeks  for restaging -f/u after scan    2.Hepatitis B and liver cirrhosis -He doesn't see a hepatologist nor an infectious disease physician,he has never received hepatitis B treatment. -I encouraged him to return to GI Dr. Tiffany Kocher for management.  3. 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 -Given he is on chemotherapy, Ipreviouslyprescribed him Doptelet (Avatrombopag) 88m BID.  -plt overall stable and moderately low    4. Left apical lungcancer stage IA,s/p SBRT, COPD -PET scanon 11/24/2017 revealed a hypermetabolic 1.0 cm left apical pulmonary nodulefavoring malignancy. Assuming non-small cell lung cancer this represents T1a N0 M0 disease (stage IA).Biopsy was not obtained -Underwent SBRT 01/05/18-02/01/18 -CT chest from 11/28/18 shows good improvement and no new nodules. -repeat CT chest in 2-3 weeks   5. 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  -He  has 2 brothers who helps him  -I discussed our resources to help with transportation if needed. -He notes increases in billing of his current treatment. I recommendedhe consult with his financial advocate Marguarite Arbour about this for more help. He is agreeable.   6. Chronic back pain, right flank and RUQ pain -He sees Dr. Dossie Arbour for pain management of chronic back pain. He has him on Oxycodone 2.73m BID. He is also on muscle relaxant Baclofen 141m  -his pain overall has gotten worse lately.  -f/u with Dr. NaDossie Arbour 7. Mouth ulcers  -Secondary to chemo  -I encouraged him to use a mouthwash, if needed I will call in Magic mouthwash.   PLAN: -Lab reviewed, due to thrombocytopenia, and his recent mouth sore, will postpone chemo to next week -[place] -f/u after scans    No problem-specific Assessment & Plan notes found for this encounter.    No orders of the defined types were placed in this encounter.  All questions were answered. The patient knows to call the clinic with any problems, questions or concerns. No barriers to learning was detected. I spent 20 minutes counseling the patient face to face. The total time spent in the appointment was 25 minutes and more than 50% was on counseling and review of test results     YaTruitt MerleMD 05/24/2019   I, AmJoslyn Devonam acting as scribe for YaTruitt MerleMD.   I have reviewed the above documentation for accuracy and completeness, and I agree with the above.

## 2019-05-24 ENCOUNTER — Inpatient Hospital Stay: Payer: Medicare Other

## 2019-05-24 ENCOUNTER — Inpatient Hospital Stay (HOSPITAL_BASED_OUTPATIENT_CLINIC_OR_DEPARTMENT_OTHER): Payer: Medicare Other | Admitting: Hematology

## 2019-05-24 ENCOUNTER — Other Ambulatory Visit: Payer: Self-pay

## 2019-05-24 ENCOUNTER — Telehealth: Payer: Self-pay | Admitting: Hematology

## 2019-05-24 VITALS — BP 115/60 | HR 75 | Temp 98.2°F | Resp 18 | Ht 73.0 in | Wt 208.0 lb

## 2019-05-24 DIAGNOSIS — K7030 Alcoholic cirrhosis of liver without ascites: Secondary | ICD-10-CM

## 2019-05-24 DIAGNOSIS — D696 Thrombocytopenia, unspecified: Secondary | ICD-10-CM

## 2019-05-24 DIAGNOSIS — K7460 Unspecified cirrhosis of liver: Secondary | ICD-10-CM | POA: Diagnosis not present

## 2019-05-24 DIAGNOSIS — M549 Dorsalgia, unspecified: Secondary | ICD-10-CM | POA: Diagnosis not present

## 2019-05-24 DIAGNOSIS — R1011 Right upper quadrant pain: Secondary | ICD-10-CM | POA: Diagnosis not present

## 2019-05-24 DIAGNOSIS — R110 Nausea: Secondary | ICD-10-CM | POA: Diagnosis not present

## 2019-05-24 DIAGNOSIS — R5383 Other fatigue: Secondary | ICD-10-CM | POA: Diagnosis not present

## 2019-05-24 DIAGNOSIS — G8929 Other chronic pain: Secondary | ICD-10-CM | POA: Diagnosis not present

## 2019-05-24 DIAGNOSIS — Z23 Encounter for immunization: Secondary | ICD-10-CM | POA: Diagnosis not present

## 2019-05-24 DIAGNOSIS — C221 Intrahepatic bile duct carcinoma: Secondary | ICD-10-CM

## 2019-05-24 DIAGNOSIS — Z79899 Other long term (current) drug therapy: Secondary | ICD-10-CM | POA: Diagnosis not present

## 2019-05-24 DIAGNOSIS — Z5111 Encounter for antineoplastic chemotherapy: Secondary | ICD-10-CM | POA: Diagnosis not present

## 2019-05-24 DIAGNOSIS — C229 Malignant neoplasm of liver, not specified as primary or secondary: Secondary | ICD-10-CM

## 2019-05-24 DIAGNOSIS — D6959 Other secondary thrombocytopenia: Secondary | ICD-10-CM | POA: Diagnosis not present

## 2019-05-24 LAB — CBC WITH DIFFERENTIAL (CANCER CENTER ONLY)
Abs Immature Granulocytes: 0.01 10*3/uL (ref 0.00–0.07)
Basophils Absolute: 0 10*3/uL (ref 0.0–0.1)
Basophils Relative: 0 %
Eosinophils Absolute: 0 10*3/uL (ref 0.0–0.5)
Eosinophils Relative: 1 %
HCT: 28 % — ABNORMAL LOW (ref 39.0–52.0)
Hemoglobin: 8.7 g/dL — ABNORMAL LOW (ref 13.0–17.0)
Immature Granulocytes: 0 %
Lymphocytes Relative: 9 %
Lymphs Abs: 0.2 10*3/uL — ABNORMAL LOW (ref 0.7–4.0)
MCH: 32.2 pg (ref 26.0–34.0)
MCHC: 31.1 g/dL (ref 30.0–36.0)
MCV: 103.7 fL — ABNORMAL HIGH (ref 80.0–100.0)
Monocytes Absolute: 0.5 10*3/uL (ref 0.1–1.0)
Monocytes Relative: 18 %
Neutro Abs: 1.9 10*3/uL (ref 1.7–7.7)
Neutrophils Relative %: 72 %
Platelet Count: 70 10*3/uL — ABNORMAL LOW (ref 150–400)
RBC: 2.7 MIL/uL — ABNORMAL LOW (ref 4.22–5.81)
RDW: 20.2 % — ABNORMAL HIGH (ref 11.5–15.5)
WBC Count: 2.7 10*3/uL — ABNORMAL LOW (ref 4.0–10.5)
nRBC: 0 % (ref 0.0–0.2)

## 2019-05-24 LAB — CMP (CANCER CENTER ONLY)
ALT: 12 U/L (ref 0–44)
AST: 23 U/L (ref 15–41)
Albumin: 3.6 g/dL (ref 3.5–5.0)
Alkaline Phosphatase: 83 U/L (ref 38–126)
Anion gap: 6 (ref 5–15)
BUN: 10 mg/dL (ref 8–23)
CO2: 28 mmol/L (ref 22–32)
Calcium: 8.7 mg/dL — ABNORMAL LOW (ref 8.9–10.3)
Chloride: 105 mmol/L (ref 98–111)
Creatinine: 0.76 mg/dL (ref 0.61–1.24)
GFR, Est AFR Am: >60 mL/min (ref >60)
GFR, Estimated: >60 mL/min (ref >60)
Glucose, Bld: 102 mg/dL — ABNORMAL HIGH (ref 70–99)
Potassium: 4 mmol/L (ref 3.5–5.1)
Sodium: 139 mmol/L (ref 135–145)
Total Bilirubin: 0.5 mg/dL (ref 0.3–1.2)
Total Protein: 7 g/dL (ref 6.5–8.1)

## 2019-05-24 LAB — MAGNESIUM: Magnesium: 1.9 mg/dL (ref 1.7–2.4)

## 2019-05-24 MED ORDER — OMEPRAZOLE 20 MG PO CPDR: 20.0000 mg | DELAYED_RELEASE_CAPSULE | Freq: Every day | ORAL | 1 refills | Status: DC

## 2019-05-24 NOTE — Telephone Encounter (Signed)
Gave avs and calendar ° °

## 2019-05-25 LAB — CANCER ANTIGEN 19-9: CA 19-9: 48 U/mL — ABNORMAL HIGH (ref 0–35)

## 2019-05-26 ENCOUNTER — Encounter: Payer: Self-pay | Admitting: Hematology

## 2019-05-30 ENCOUNTER — Telehealth: Payer: Self-pay

## 2019-05-30 ENCOUNTER — Encounter: Payer: Self-pay | Admitting: Pain Medicine

## 2019-05-30 ENCOUNTER — Ambulatory Visit: Payer: Medicare Other | Attending: Pain Medicine | Admitting: Pain Medicine

## 2019-05-30 ENCOUNTER — Other Ambulatory Visit: Payer: Self-pay

## 2019-05-30 ENCOUNTER — Telehealth: Payer: Self-pay | Admitting: *Deleted

## 2019-05-30 DIAGNOSIS — M79601 Pain in right arm: Secondary | ICD-10-CM

## 2019-05-30 DIAGNOSIS — M542 Cervicalgia: Secondary | ICD-10-CM | POA: Diagnosis not present

## 2019-05-30 DIAGNOSIS — M792 Neuralgia and neuritis, unspecified: Secondary | ICD-10-CM

## 2019-05-30 DIAGNOSIS — K5903 Drug induced constipation: Secondary | ICD-10-CM

## 2019-05-30 DIAGNOSIS — G894 Chronic pain syndrome: Secondary | ICD-10-CM

## 2019-05-30 DIAGNOSIS — M79605 Pain in left leg: Secondary | ICD-10-CM

## 2019-05-30 DIAGNOSIS — G893 Neoplasm related pain (acute) (chronic): Secondary | ICD-10-CM

## 2019-05-30 DIAGNOSIS — M7918 Myalgia, other site: Secondary | ICD-10-CM

## 2019-05-30 DIAGNOSIS — M5442 Lumbago with sciatica, left side: Secondary | ICD-10-CM | POA: Diagnosis not present

## 2019-05-30 DIAGNOSIS — M5441 Lumbago with sciatica, right side: Secondary | ICD-10-CM

## 2019-05-30 DIAGNOSIS — M79604 Pain in right leg: Secondary | ICD-10-CM

## 2019-05-30 DIAGNOSIS — T402X5A Adverse effect of other opioids, initial encounter: Secondary | ICD-10-CM

## 2019-05-30 DIAGNOSIS — G8929 Other chronic pain: Secondary | ICD-10-CM

## 2019-05-30 MED ORDER — BACLOFEN 10 MG PO TABS: 10.0000 mg | ORAL_TABLET | Freq: Three times a day (TID) | ORAL | 0 refills | Status: DC

## 2019-05-30 MED ORDER — GABAPENTIN 600 MG PO TABS: 600.0000 mg | ORAL_TABLET | Freq: Three times a day (TID) | ORAL | 0 refills | Status: DC

## 2019-05-30 MED ORDER — DULCOLAX 5 MG PO TBEC: 5.0000 mg | DELAYED_RELEASE_TABLET | Freq: Every day | ORAL | 0 refills | Status: DC | PRN

## 2019-05-30 MED ORDER — OXYCODONE HCL 5 MG PO TABS: 2.5000 mg | ORAL_TABLET | Freq: Two times a day (BID) | ORAL | 0 refills | Status: DC

## 2019-05-30 NOTE — Telephone Encounter (Signed)
Done

## 2019-05-30 NOTE — Telephone Encounter (Signed)
He called back to go over medications for todays visit. Please call him back.

## 2019-05-30 NOTE — Progress Notes (Addendum)
Pain Management Virtual Encounter Note - Virtual Visit via Telephone Telehealth (real-time audio visits between healthcare provider and patient).   Patient's Phone No. & Preferred Pharmacy:  [phone] (home); There is no such number on file (mobile).; (Preferred) [phone] No e-mail address on record  [id number] - [place], [address] [place] [address] Phone: [phone] Fax: [phone]    Pre-screening note:  Our staff contacted Mr. Weinberg and offered him an "in person", "face-to-face" appointment versus a telephone encounter. He indicated preferring the telephone encounter, at this time.   Reason for Virtual Visit: COVID-19*  Social distancing based on CDC and AMA recommendations.   I contacted Nonie Hoyer on 05/30/2019 via telephone.      I clearly identified myself as Gaspar Cola, MD. I verified that I was speaking with the correct person using two identifiers (Name: NORVEL WENKER, and date of birth: [birth year]).  Advanced Informed Consent I sought verbal advanced consent from Nonie Hoyer for virtual visit interactions. I informed Mr. Lindell of possible security and privacy concerns, risks, and limitations associated with providing "not-in-person" medical evaluation and management services. I also informed Mr. Michels of the availability of "in-person" appointments. Finally, I informed him that there would be a charge for the virtual visit and that he could be  personally, fully or partially, financially responsible for it. Mr. Wenig expressed understanding and agreed to proceed.   Historic Elements   Mr. RALIEGH SCOBIE is a 63 y.o. year old, male patient evaluated today after his last encounter by our practice on 05/30/2019. Mr. Colan  has a past medical history of Abnormal MRI, lumbar spine (2015) (01/21/2016), Anxiety, Back ache (05/06/2014), Cervical pain (05/06/2014), Chronic alcoholic  pancreatitis ([place]) (06/23/2015), COPD (chronic obstructive pulmonary disease) ([place]), De Quervain's tenosynovitis, left (07/07/2017), Depression, Dystonia, Epileptic disorder ([place]) (06/23/2015), Extremity pain (05/06/2014), Febrile seizures ([place]), Foot pain (09/11/2014), Gastric ulcer (06/23/2015), Gout, H/O neoplasm (06/23/2015), Head injury (06/23/2015), Hemorrhoids, Hepatic cirrhosis ([place]) (06/23/2015), Hepatitis B, History of GI bleed, Leukopenia, Liver cancer ([place]) (08/2018), Neurogenic pain (01/21/2016), Nodule of upper lobe of left lung (12/02/2017), Non-small cell cancer of left lung ([place]) (2019), Obstructive sleep apnea (06/23/2015), Osteoarthritis of spine with radiculopathy, lumbar region (06/23/2015), Osteoarthritis of spine with radiculopathy, lumbosacral region (06/23/2015), Peripheral neuropathy (06/23/2015), Peripheral neuropathy (Alcoholic) (78/67/6720), Personal history of tobacco use, presenting hazards to health (01/09/2016), Pulse irregularity, Seizures ([place]), Spinal stenosis, Splenomegaly (05/30/2013), Thrombocytopenia ([place]) (94/70/9628), and Uncomplicated opioid dependence ([place]) (06/23/2015). He also  has a past surgical history that includes surgery for cervical neck fracture; Esophagogastroduodenoscopy (11/2013); Tooth Extraction (Right, 09/28/2016); and Esophagogastroduodenoscopy (egd) with propofol (N/A, 06/23/2018). Mr. Harren has a current medication list which includes the following prescription(s): alprazolam, avatrombopag maleate, baclofen, baclofen, dulcolax, dulcolax, cetirizine, folic acid, gabapentin, gabapentin, magnesium, omeprazole, ondansetron, oxycodone, oxycodone, oxycodone, and prochlorperazine. He  reports that he quit smoking about 19 months ago. His smoking use included cigarettes. He started smoking about 19 months ago. He has a 17.50 pack-year smoking history. His smokeless tobacco use includes snuff. He reports that he does not drink alcohol or use drugs. Mr. Wingerter is allergic  to codeine; morphine; and duloxetine.   HPI  Today, he is being contacted for medication management.  The patient indicates doing well with the current medication regimen. No adverse reactions or side effects reported to the medications.  Apparently the patient was under the impression that he did not have any more prescriptions in  the pharmacy.  But it turns out that he does.  He simply did not call them to verify.  Since I spoke to him today I went ahead and gave him an of refills to last until 08/31/2019.  Apparently he has been expecting the pharmacy to call him a couple days in advance before he is due for his refill, and he has not been receiving any of those calls.  Pharmacotherapy Assessment  Analgesic: Oxycodone 2.5 one every 12 hours (5 mg/day of oxycodone) (has enough to last until 08/31/2019) MME/day:7.5 mg/day.   Monitoring: Pharmacotherapy: No side-effects or adverse reactions reported. [place] PMP: PDMP reviewed during this encounter.       Compliance: No problems identified. Effectiveness: Clinically acceptable. Plan: Refer to "POC".  UDS:  Summary  Date Value Ref Range Status  10/09/2018 FINAL  Final    Comment:    ==================================================================== TOXASSURE SELECT 13 (MW) ==================================================================== Test                             Result       Flag       Units Drug Present and Declared for Prescription Verification   Oxycodone                      754          EXPECTED   ng/mg creat   Oxymorphone                    232          EXPECTED   ng/mg creat   Noroxycodone                   986          EXPECTED   ng/mg creat    Sources of oxycodone include scheduled prescription medications.    Oxymorphone and noroxycodone are expected metabolites of    oxycodone. Oxymorphone is also available as a scheduled    prescription medication. Drug Absent but Declared for Prescription Verification    Alprazolam                     Not Detected UNEXPECTED ng/mg creat ==================================================================== Test                      Result    Flag   Units      Ref Range   Creatinine              37               mg/dL      >=20 ==================================================================== Declared Medications:  The flagging and interpretation on this report are based on the  following declared medications.  Unexpected results may arise from  inaccuracies in the declared medications.  **Note: The testing scope of this panel includes these medications:  Alprazolam (Xanax)  Oxycodone  **Note: The testing scope of this panel does not include following  reported medications:  Baclofen (Lioresal)  Cetirizine (Zyrtec)  Docusate (Dulcolax)  Folic acid (Folvite)  Gabapentin  Magnesium Oxide  Omeprazole (Prilosec) ==================================================================== For clinical consultation, please call [phone]. ====================================================================    Laboratory Chemistry Profile (12 mo)  Renal: 05/24/2019: BUN 10; Creatinine 0.76  Lab Results  Component Value Date   GFRAA >60 05/24/2019   GFRNONAA >60 05/24/2019   Hepatic: 05/24/2019: Albumin 3.6 Lab Results  Component Value Date   AST 23 05/24/2019   ALT 12 05/24/2019   Other: No results found for requested labs within last 8760 hours. Note: Above Lab results reviewed.  Imaging  Last 90 days:  Mr Abdomen W Wo Contrast  Result Date: 03/29/2019 CLINICAL DATA:  Follow-up cholangiocarcinoma. History of Y 90 procedure. Patient currently undergoing chemotherapy. EXAM: MRI ABDOMEN WITHOUT AND WITH CONTRAST TECHNIQUE: Multiplanar multisequence MR imaging of the abdomen was performed both before and after the administration of intravenous contrast. CONTRAST:  9 cc Gadavist COMPARISON:  MRI 01/15/2019 and 04/19/2018 FINDINGS: Lower chest: The lung  bases are clear of an acute process. No worrisome pulmonary lesions. No pericardial effusion. Enlarged epicardial lymph node is again demonstrated. This measures 2.0 cm and previously measured 1.3 cm. Hepatobiliary: The large central necrotic hepatic mass measures 8.1 x 5.7 x 7.0 cm. It previously measured 7.0 x 5.3 x 6.4 cm. Adjacent necrotic adenopathy in the celiac axis measures a maximum of 3.5 cm and previously measured 3.5 cm. Necrotic appearing hepatic duodenum ligament lymph node measures 2.1 cm and previously measured 1.8 cm. Left para-aortic lymph node measures a maximum of 19.5 mm and previously measured 16 mm. A few simple nonenhancing cysts are stable. 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 The gallbladder is contracted and thick walled. Pancreas:  No mass, inflammation or ductal dilatation. Spleen:  Stable splenomegaly with maximum measurement of 17 cm. Adrenals/Urinary Tract: The adrenal glands and kidneys are unremarkable and stable. Stomach/Bowel: The stomach, duodenum, visualized small bowel loops and visualized colon are grossly normal. Vascular/Lymphatic: Stable atherosclerotic changes involving the aorta but no aneurysm or dissection. The branch vessels are patent. The major venous structures are patent except for the hepatic veins as discussed above. Other:  No abdominal ascites. Musculoskeletal: No significant bony findings. IMPRESSION: 1. Slight interval increase in size of the large necrotic central hepatic neoplasm. 2. Stable celiac axis lymph node and slightly enlarged left para-aortic, right hepatoduodenal ligament and epicardial lymph nodes. 3. 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 4. Stable splenomegaly. Electronically Signed   By: Marijo Sanes M.D.   On: 03/29/2019 11:15    Assessment  The primary encounter diagnosis was Chronic pain syndrome. Diagnoses of  Chronic low back pain (Primary Area of Pain) (Bilateral) (L>R), Chronic lower extremity pain (Secondary area of Pain) (Bilateral) (L>R), Chronic neck pain (Third area of Pain) (Bilateral) (R>L), Chronic upper extremity pain (Fourth area of Pain) (Bilateral) (R>L), Cancer associated pain, Musculoskeletal pain, Neurogenic pain, and Opioid-induced constipation (OIC) were also pertinent to this visit.  Plan of Care  I have discontinued Khalel Alms. Auth's dexamethasone. I am also having him start on Dulcolax, gabapentin, and baclofen. Additionally, I am having him maintain his folic acid, ALPRAZolam, cetirizine, prochlorperazine, ondansetron, Magnesium, oxyCODONE, oxyCODONE, baclofen, gabapentin, Dulcolax, Avatrombopag Maleate, omeprazole, and oxyCODONE.  Pharmacotherapy (Medications Ordered): Meds ordered this encounter  Medications  . bisacodyl (DULCOLAX) 5 MG EC tablet    Sig: Take 1 tablet (5 mg total) by mouth daily as needed.    Dispense:  59 tablet    Refill:  0    Fill one day early if pharmacy is closed on scheduled refill date. May substitute for generic if available.  . gabapentin (NEURONTIN) 600 MG tablet    Sig: Take 1 tablet (600 mg total) by mouth 3 (three) times daily.    Dispense:  177  tablet    Refill:  0    Fill one day early if pharmacy is closed on scheduled refill date. May substitute for generic if available.  . baclofen (LIORESAL) 10 MG tablet    Sig: Take 1 tablet (10 mg total) by mouth 3 (three) times daily.    Dispense:  186 tablet    Refill:  0    Fill one day early if pharmacy is closed on scheduled refill date. May substitute for generic if available.  Marland Kitchen oxyCODONE (OXY IR/ROXICODONE) 5 MG immediate release tablet    Sig: Take 0.5 tablets (2.5 mg total) by mouth 2 (two) times daily. Must last 30 days    Dispense:  30 tablet    Refill:  0    Chronic Pain: STOP Act (Not applicable) Fill 1 day early if closed on refill date. Do not fill until: 08/01/2019. To last  until: 08/31/2019. Avoid benzodiazepines within 8 hours of opioids   Orders:  No orders of the defined types were placed in this encounter.  Follow-up plan:   Return in about 3 months (around 08/27/2019) for (VV), (MM).      Interventional therapies:  Considering: Diagnostic left L5-S1 LESI  Diagnostic bilateral L4-5 TFESI  Diagnostic bilateral lumbar facet block  Diagnostic right-sided CESI    Palliative PRN treatment(s): Palliative left De Quervain's tendon injection     Recent Visits Date Type Provider Dept  03/26/19 Office Visit Milinda Pointer, MD [facility]  Showing recent visits within past 90 days and meeting all other requirements   Today's Visits Date Type Provider Dept  05/30/19 Office Visit Milinda Pointer, MD [facility]  Showing today's visits and meeting all other requirements   Future Appointments No visits were found meeting these conditions.  Showing future appointments within next 90 days and meeting all other requirements   I discussed the assessment and treatment plan with the patient. The patient was provided an opportunity to ask questions and all were answered. The patient agreed with the plan and demonstrated an understanding of the instructions.  Patient advised to call back or seek an in-person evaluation if the symptoms or condition worsens.  Total duration of non-face-to-face encounter: 13 minutes.  Note by: Gaspar Cola, MD Date: 05/30/2019; Time: 3:09 PM  Note: This dictation was prepared with Dragon dictation. Any transcriptional errors that may result from this process are unintentional.  Disclaimer:  * Given the special circumstances of the COVID-19 pandemic, the federal government has announced that the Office for Civil Rights (OCR) will exercise its enforcement discretion and will not impose penalties on physicians using telehealth in the event of noncompliance with regulatory requirements under the  [place] and [place] (HIPAA) in connection with the good faith provision of telehealth during the XTAVW-97 national public health emergency. ([place])

## 2019-05-31 ENCOUNTER — Other Ambulatory Visit: Payer: Self-pay

## 2019-05-31 ENCOUNTER — Inpatient Hospital Stay: Payer: Medicare Other

## 2019-05-31 VITALS — BP 117/65 | HR 77 | Temp 98.5°F | Wt 206.8 lb

## 2019-05-31 DIAGNOSIS — R5383 Other fatigue: Secondary | ICD-10-CM | POA: Diagnosis not present

## 2019-05-31 DIAGNOSIS — C221 Intrahepatic bile duct carcinoma: Secondary | ICD-10-CM | POA: Diagnosis not present

## 2019-05-31 DIAGNOSIS — R1011 Right upper quadrant pain: Secondary | ICD-10-CM | POA: Diagnosis not present

## 2019-05-31 DIAGNOSIS — Z23 Encounter for immunization: Secondary | ICD-10-CM | POA: Diagnosis not present

## 2019-05-31 DIAGNOSIS — Z5111 Encounter for antineoplastic chemotherapy: Secondary | ICD-10-CM | POA: Diagnosis not present

## 2019-05-31 DIAGNOSIS — R110 Nausea: Secondary | ICD-10-CM | POA: Diagnosis not present

## 2019-05-31 DIAGNOSIS — M549 Dorsalgia, unspecified: Secondary | ICD-10-CM | POA: Diagnosis not present

## 2019-05-31 DIAGNOSIS — Z79899 Other long term (current) drug therapy: Secondary | ICD-10-CM | POA: Diagnosis not present

## 2019-05-31 DIAGNOSIS — C229 Malignant neoplasm of liver, not specified as primary or secondary: Secondary | ICD-10-CM

## 2019-05-31 DIAGNOSIS — G8929 Other chronic pain: Secondary | ICD-10-CM | POA: Diagnosis not present

## 2019-05-31 DIAGNOSIS — D6959 Other secondary thrombocytopenia: Secondary | ICD-10-CM | POA: Diagnosis not present

## 2019-05-31 DIAGNOSIS — K7460 Unspecified cirrhosis of liver: Secondary | ICD-10-CM | POA: Diagnosis not present

## 2019-05-31 LAB — CMP (CANCER CENTER ONLY)
ALT: 11 U/L (ref 0–44)
AST: 23 U/L (ref 15–41)
Albumin: 3.6 g/dL (ref 3.5–5.0)
Alkaline Phosphatase: 89 U/L (ref 38–126)
Anion gap: 10 (ref 5–15)
BUN: 6 mg/dL — ABNORMAL LOW (ref 8–23)
CO2: 27 mmol/L (ref 22–32)
Calcium: 8.9 mg/dL (ref 8.9–10.3)
Chloride: 103 mmol/L (ref 98–111)
Creatinine: 0.76 mg/dL (ref 0.61–1.24)
GFR, Est AFR Am: >60 mL/min (ref >60)
GFR, Estimated: >60 mL/min (ref >60)
Glucose, Bld: 105 mg/dL — ABNORMAL HIGH (ref 70–99)
Potassium: 3.9 mmol/L (ref 3.5–5.1)
Sodium: 140 mmol/L (ref 135–145)
Total Bilirubin: 0.5 mg/dL (ref 0.3–1.2)
Total Protein: 7.4 g/dL (ref 6.5–8.1)

## 2019-05-31 LAB — MAGNESIUM: Magnesium: 1.9 mg/dL (ref 1.7–2.4)

## 2019-05-31 LAB — CBC WITH DIFFERENTIAL (CANCER CENTER ONLY)
Abs Immature Granulocytes: 0.01 10*3/uL (ref 0.00–0.07)
Basophils Absolute: 0 10*3/uL (ref 0.0–0.1)
Basophils Relative: 0 %
Eosinophils Absolute: 0 10*3/uL (ref 0.0–0.5)
Eosinophils Relative: 1 %
HCT: 30.1 % — ABNORMAL LOW (ref 39.0–52.0)
Hemoglobin: 9.3 g/dL — ABNORMAL LOW (ref 13.0–17.0)
Immature Granulocytes: 0 %
Lymphocytes Relative: 11 %
Lymphs Abs: 0.4 10*3/uL — ABNORMAL LOW (ref 0.7–4.0)
MCH: 31.7 pg (ref 26.0–34.0)
MCHC: 30.9 g/dL (ref 30.0–36.0)
MCV: 102.7 fL — ABNORMAL HIGH (ref 80.0–100.0)
Monocytes Absolute: 0.5 10*3/uL (ref 0.1–1.0)
Monocytes Relative: 14 %
Neutro Abs: 2.4 10*3/uL (ref 1.7–7.7)
Neutrophils Relative %: 74 %
Platelet Count: 175 10*3/uL (ref 150–400)
RBC: 2.93 MIL/uL — ABNORMAL LOW (ref 4.22–5.81)
RDW: 19.3 % — ABNORMAL HIGH (ref 11.5–15.5)
WBC Count: 3.3 10*3/uL — ABNORMAL LOW (ref 4.0–10.5)
nRBC: 0 % (ref 0.0–0.2)

## 2019-05-31 MED ORDER — METHYLPREDNISOLONE SODIUM SUCC 40 MG IJ SOLR
60.0000 mg | Freq: Once | INTRAMUSCULAR | Status: AC
  Administered 2019-05-31: 12:00:00 60 mg via INTRAVENOUS

## 2019-05-31 MED ORDER — SODIUM CHLORIDE 0.9 % IV SOLN
Freq: Once | INTRAVENOUS | Status: AC
  Administered 2019-05-31: 09:00:00 via INTRAVENOUS
  Filled 2019-05-31: qty 250

## 2019-05-31 MED ORDER — SODIUM CHLORIDE 0.9 % IV SOLN
600.0000 mg/m2 | Freq: Once | INTRAVENOUS | Status: AC
  Administered 2019-05-31: 1292 mg via INTRAVENOUS
  Filled 2019-05-31: qty 33.98

## 2019-05-31 MED ORDER — METHYLPREDNISOLONE SODIUM SUCC 40 MG IJ SOLR
INTRAMUSCULAR | Status: AC
  Filled 2019-05-31: qty 1

## 2019-05-31 MED ORDER — PALONOSETRON HCL INJECTION 0.25 MG/5ML
0.2500 mg | Freq: Once | INTRAVENOUS | Status: AC
  Administered 2019-05-31: 0.25 mg via INTRAVENOUS

## 2019-05-31 MED ORDER — SODIUM CHLORIDE 0.9 % IV SOLN
25.0000 mg/m2 | Freq: Once | INTRAVENOUS | Status: AC
  Administered 2019-05-31: 13:00:00 54 mg via INTRAVENOUS
  Filled 2019-05-31: qty 54

## 2019-05-31 MED ORDER — INFLUENZA VAC SPLIT QUAD 0.5 ML IM SUSY
PREFILLED_SYRINGE | INTRAMUSCULAR | Status: AC
  Filled 2019-05-31: qty 0.5

## 2019-05-31 MED ORDER — INFLUENZA VAC SPLIT QUAD 0.5 ML IM SUSY
0.5000 mL | PREFILLED_SYRINGE | Freq: Once | INTRAMUSCULAR | Status: AC
  Administered 2019-05-31: 0.5 mL via INTRAMUSCULAR

## 2019-05-31 MED ORDER — POTASSIUM CHLORIDE 2 MEQ/ML IV SOLN
Freq: Once | INTRAVENOUS | Status: AC
  Administered 2019-05-31: 10:00:00 via INTRAVENOUS
  Filled 2019-05-31: qty 10

## 2019-05-31 MED ORDER — PALONOSETRON HCL INJECTION 0.25 MG/5ML
INTRAVENOUS | Status: AC
  Filled 2019-05-31: qty 5

## 2019-05-31 NOTE — Patient Instructions (Signed)
[place] Discharge Instructions for Patients Receiving Chemotherapy  Today you received the following chemotherapy agents Gemcitabine (GEMZAR) & Cisplatin (PLATINOL).  To help prevent nausea and vomiting after your treatment, we encourage you to take your nausea medication as prescribed.   If you develop nausea and vomiting that is not controlled by your nausea medication, call the clinic.   BELOW ARE SYMPTOMS THAT SHOULD BE REPORTED IMMEDIATELY:  *FEVER GREATER THAN 100.5 F  *CHILLS WITH OR WITHOUT FEVER  NAUSEA AND VOMITING THAT IS NOT CONTROLLED WITH YOUR NAUSEA MEDICATION  *UNUSUAL SHORTNESS OF BREATH  *UNUSUAL BRUISING OR BLEEDING  TENDERNESS IN MOUTH AND THROAT WITH OR WITHOUT PRESENCE OF ULCERS  *URINARY PROBLEMS  *BOWEL PROBLEMS  UNUSUAL RASH Items with * indicate a potential emergency and should be followed up as soon as possible.  Feel free to call the clinic should you have any questions or concerns. The clinic phone number is [phone].  Please show the [place] at check-in to the Emergency Department and triage nurse.  Coronavirus (COVID-19) Are you at risk?  Are you at risk for the Coronavirus (COVID-19)?  To be considered HIGH RISK for Coronavirus (COVID-19), you have to meet the following criteria:  . Traveled to [place], [place], [place], [place] or [place]; or in the [place] to [place], [place], [place], or [place]; and have fever, cough, and shortness of breath within the last 2 weeks of travel OR . Been in close contact with a person diagnosed with COVID-19 within the last 2 weeks and have fever, cough, and shortness of breath . IF YOU DO NOT MEET THESE CRITERIA, YOU ARE CONSIDERED LOW RISK FOR COVID-19.  What to do if you are HIGH RISK for COVID-19?  Marland Kitchen If you are having a medical emergency, call 911. . Seek medical care right away. Before you go to a doctor's office, urgent care or emergency department, call  ahead and tell them about your recent travel, contact with someone diagnosed with COVID-19, and your symptoms. You should receive instructions from your physician's office regarding next steps of care.  . When you arrive at healthcare provider, tell the healthcare staff immediately you have returned from visiting [place], [place], [place], [place] or [place]; or traveled in the [place] to [place], [place], [place], or [place]; in the last two weeks or you have been in close contact with a person diagnosed with COVID-19 in the last 2 weeks.   . Tell the health care staff about your symptoms: fever, cough and shortness of breath. . After you have been seen by a medical provider, you will be either: o Tested for (COVID-19) and discharged home on quarantine except to seek medical care if symptoms worsen, and asked to  - Stay home and avoid contact with others until you get your results (4-5 days)  - Avoid travel on public transportation if possible (such as bus, train, or airplane) or o Sent to the Emergency Department by EMS for evaluation, COVID-19 testing, and possible admission depending on your condition and test results.  What to do if you are LOW RISK for COVID-19?  Reduce your risk of any infection by using the same precautions used for avoiding the common cold or flu:  Marland Kitchen Wash your hands often with soap and warm water for at least 20 seconds.  If soap and water are not readily available, use an alcohol-based hand sanitizer with at least 60% alcohol.  Marland Kitchen  If coughing or sneezing, cover your mouth and nose by coughing or sneezing into the elbow areas of your shirt or coat, into a tissue or into your sleeve (not your hands). . Avoid shaking hands with others and consider head nods or verbal greetings only. . Avoid touching your eyes, nose, or mouth with unwashed hands.  . Avoid close contact with people who are sick. . Avoid places or events with large numbers of people in one location,  like concerts or sporting events. . Carefully consider travel plans you have or are making. . If you are planning any travel outside or inside the [place], visit the CDC's Travelers' Health webpage for the latest health notices. . If you have some symptoms but not all symptoms, continue to monitor at home and seek medical attention if your symptoms worsen. . If you are having a medical emergency, call 911.   [place] / e-Visit: [email or web address]         [facility]: [facility]: [phone]                   [facility]: [phone]

## 2019-06-04 ENCOUNTER — Telehealth: Payer: Self-pay

## 2019-06-04 NOTE — Telephone Encounter (Signed)
Wednesday Dr. Dossie Arbour called him for a phone visit and [facility],  and they were  [facility] except for the oxycodone, [facility]. Please call him to clarify if needed....Marland KitchenMarland Kitchen

## 2019-06-05 NOTE — Telephone Encounter (Signed)
Patient lvmail stating everything [facility]one needs to be sent to [facility]

## 2019-06-06 NOTE — Telephone Encounter (Signed)
Left voicemail with patient that Dr Dossie Arbour is not in office this week and he could [facility] and next time be very clear that he uses 2 different pharmacies.  Asked to please call if he has additional questions.

## 2019-06-07 ENCOUNTER — Telehealth: Payer: Self-pay | Admitting: *Deleted

## 2019-06-07 ENCOUNTER — Telehealth: Payer: Self-pay | Admitting: Pain Medicine

## 2019-06-07 DIAGNOSIS — K5903 Drug induced constipation: Secondary | ICD-10-CM

## 2019-06-07 DIAGNOSIS — M7918 Myalgia, other site: Secondary | ICD-10-CM

## 2019-06-07 DIAGNOSIS — M792 Neuralgia and neuritis, unspecified: Secondary | ICD-10-CM

## 2019-06-07 MED ORDER — BACLOFEN 10 MG PO TABS: 10.0000 mg | ORAL_TABLET | Freq: Three times a day (TID) | ORAL | 0 refills | Status: DC

## 2019-06-07 MED ORDER — GABAPENTIN 600 MG PO TABS: 600.0000 mg | ORAL_TABLET | Freq: Three times a day (TID) | ORAL | 0 refills | Status: DC

## 2019-06-07 MED ORDER — DULCOLAX 5 MG PO TBEC: 5.0000 mg | DELAYED_RELEASE_TABLET | Freq: Every day | ORAL | 0 refills | Status: DC | PRN

## 2019-06-07 NOTE — Telephone Encounter (Signed)
Dr. Holley Raring agreed to write the scripts to send to [facility]. I called patient after initial message to verify which medications are to be sent. Message left instructing him to return our call, he did not call back until today. I once again returned his call and left a message.

## 2019-06-07 NOTE — Telephone Encounter (Signed)
John [name] called stating he cannot afford to pick [facility]. When they are sent to [facility] he gets them at no charge as [employer] pays for them. He told the pharmacy he could not pick the meds up there.  Wants to speak with a Nurse please. He knows Dr. Dossie Arbour is out to next week and cannot change anything until then.

## 2019-06-07 NOTE — Telephone Encounter (Signed)
Spoke with patient and he needs Dulcolax 5 mg EC, Gabapentin 600 mg and baclofen 10 mg resent to [facility]. Told patient that in the future to please make sure that Dr Dossie Arbour is aware that he needs Rx's sent to two different pharmacies.

## 2019-06-14 ENCOUNTER — Ambulatory Visit
Admission: RE | Admit: 2019-06-14 | Discharge: 2019-06-14 | Disposition: A | Discharge status: Home / Self Care | Payer: Medicare Other | Source: Ambulatory Visit | Attending: Hematology | Admitting: Hematology

## 2019-06-14 ENCOUNTER — Other Ambulatory Visit: Payer: Self-pay

## 2019-06-14 DIAGNOSIS — Z85118 Personal history of other malignant neoplasm of bronchus and lung: Secondary | ICD-10-CM | POA: Insufficient documentation

## 2019-06-14 DIAGNOSIS — R918 Other nonspecific abnormal finding of lung field: Secondary | ICD-10-CM | POA: Diagnosis not present

## 2019-06-14 DIAGNOSIS — C229 Malignant neoplasm of liver, not specified as primary or secondary: Secondary | ICD-10-CM | POA: Diagnosis not present

## 2019-06-14 DIAGNOSIS — C221 Intrahepatic bile duct carcinoma: Secondary | ICD-10-CM | POA: Insufficient documentation

## 2019-06-14 DIAGNOSIS — K8020 Calculus of gallbladder without cholecystitis without obstruction: Secondary | ICD-10-CM | POA: Diagnosis not present

## 2019-06-14 MED ORDER — GADOBUTROL 1 MMOL/ML IV SOLN
9.0000 mL | Freq: Once | INTRAVENOUS | Status: AC | PRN
  Administered 2019-06-14: 09:00:00 9 mL via INTRAVENOUS

## 2019-06-14 NOTE — Progress Notes (Signed)
John [name]   [phone] [phone]   Clinic Follow up Note   Patient Care Team: John Athens, MD as PCP - General (Internal Medicine) John Silvas, MD (Gastroenterology) John Pointer, MD as Referring Physician (Pain Medicine)  Date of Service:  06/18/2019  CHIEF COMPLAINT: F/u ofadenocarcinoma in liver  SUMMARY OF ONCOLOGIC HISTORY: Oncology History  Cholangiocarcinoma ([place])  05/05/2018 Initial Biopsy   DIAGNOSIS: 05/05/18 A. LIVER MASS; ULTRASOUND-GUIDED BIOPSY:  - CYTOKERATIN 7 POSITIVE ADENOCARCINOMA WITH ABUNDANT NECROSIS, SEE  COMMENT.  Diagnosis 06/28/18 Consult- Comprehensive, [phone] Liver Bx - POORLY DIFFERENTIATED CARCINOMA INVOLVING LIVER PARENCHYMA. SEE NOTE. Diagnosis Note Most of the tumor shows a abortive glad formation, consistent with an adenocarcinoma. However, in some areas, the tumor cells show increased cytoplasm with a nested architecture, giving them a hepatoid appearance. Immunohistochemical stains performed at the outside institution show that the tumor cells are positive for CK7 and negative for TTF-1, Napsin-A, CDX-2, Hepar-1 and CD34. Immunostains performed at [facility] show that glypican-3 has patchy positive staining in the hepatoid-appearing tumor cells. Arginase and AFP stains show very focal, weak staining. Immunostain of pCEA shows focal canalicular-like staining pattern in the tumor cells. The findings are consistent with a cholangiocarcinoma but the the tumor histomorphology and staining pattern are suggestive of a hepatocellular carcinoma component, raising possibility of a combined hepatocellular cholangiocarcinoma. Additional biopsies or re-evaluation on the resection specimen is suggested for confirmation.   06/02/2018 PET scan   PET 06/02/18  IMPRESSION: 1. Solitary hypermetabolic lesion in the central LEFT hepatic lobe consistent with malignancy. 2. No metabolic activity remaining in LEFT  apical pulmonary nodule which is reduced in size. 3. No evidence of metastatic adenopathy on skull base to thigh FDG PET scan. 4. Cirrhotic liver.  Cholelithiasis 5.  Aortic Atherosclerosis (ICD10-I70.0).   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 Jearld Shines at [facility]  Estimated dose to liver target: 117 Gy Estimated dose to lung: 5.56 Gy Impression: Successful radioembolization with administration of intravascular yttrium-90 Theraspheres as described above.   10/18/2018 Imaging   US Abdomen 10/18/18 IMPRESSION: 1. Cholelithiasis without evidence of acute cholecystitis. 2. 2.2 cm left hepatic lobe mass consistent with known malignancy. 3. Cirrhosis and splenomegaly.   11/09/2018 Imaging   MRI Abdomen 11/09/18 at [facility]  Impression: Marked increased size of the mass centered in segment 4A with new satellite lesions seen in segments 2 and 3, likely representing multifocal mass-forming cholangiocarcinoma. New tumor invasion seen within the right anterior portal vein and middle hepatic vein to the level of the IVC, which is a pattern more commonly seen with hepatocellular carcinoma.   11/25/2018 Miscellaneous   Foundation One 3/21.2020  MSI -Stable - cannot be determined  Tumor Mutation Burden - cannot be determined  TERT promoter 124C>T TP53 [id number]   11/28/2018 Imaging   CT Chest 11/28/18  IMPRESSION: 1. Further improvement in left apical nodule, now nearly resolved. Mild surrounding radiation changes. 2. No new or enlarging pulmonary nodules. 3. Treated lesion in the left hepatic lobe is not optimally visualized on this noncontrast study, although appears slightly larger. 4. Progressive adenopathy in the upper abdomen. 5. Additional incidental findings are stable, including cirrhosis, splenomegaly, aortic valvular calcifications, Aortic Atherosclerosis (ICD10-I70.0) and Emphysema  (ICD10-J43.9).   12/25/2018 Cancer Staging   Staging form: Intrahepatic Bile Duct, AJCC 8th Edition - Clinical stage from 12/25/2018: Stage II (cT2(m), cN0, cM0) - Signed by [name], MD on  12/27/2018   01/24/2019 - 06/18/2019 Chemotherapy   First line Cisplatin and Gemcitabine every 2 weeks starting 01/24/19  -He did have reaction to IV Emend and dexa with SOB, chest pain and flushing, will d/c and will switch dexa to Solu-Medrol on cycle 2.   -Stopped on 06/18/19 due to disease prgression.    03/28/2019 Imaging   MRI IMPRESSION: 1. Slight interval increase in size of the large necrotic central hepatic neoplasm. 2. Stable celiac axis lymph node and slightly enlarged left para-aortic, right hepatoduodenal ligament and epicardial lymph nodes. 3. 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 4. Stable splenomegaly.   06/14/2019 Imaging    CT Chest WO Contrast 06/14/19 IMPRESSION: 1. Progressive metastatic adenopathy in the chest involving right prevascular and anterior pericardiophrenic mediastinal nodes. Progressive metastatic adenopathy in the porta hepatis. 2. Stable treated apical left upper lobe pulmonary nodule with evolving surrounding postradiation changes. 3. Please see the separate concurrent MRI abdomen report from today for details regarding the left liver lobe mass.   Aortic Atherosclerosis (ICD10-I70.0) and Emphysema (ICD10-J43.9).   06/14/2019 Imaging    MRI abdomen 06/14/19  IMPRESSION: 1. Continued mild progression of the dominant left hepatic liver mass. 2. Metastatic lymphadenopathy in the anterior mediastinum, celiac axis, hepato duodenal ligament, and para-aortic space is similar in the interval. 3. 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 4. Non opacification of the left portal  vein suggests thrombosis. 5. Splenomegaly. 6. Cholelithiasis.    Chemotherapy   PENDING Second-line oral Xeloda 2032m in the AM, 15031min the PM BID 2 weeks on/1 week off.       CURRENT THERAPY:  ChemoGemcitabine and Cisplatinevery 2 weeks starting 01/24/19. D/c on 06/18/19 due to disease progression  PENDING Second-line oral Xeloda 200020mn the AM, 1500m69m the PM BID 2 weeks on/1 week off.   INTERVAL HISTORY:  StepKIRAN CARLINEhere for a follow up and treatment. She presents to the clinic alone. He notes lately having soreness of diffuse abdomen which can flare mainly at night. He notes he takes oxycodone at night but does not help as much. He feels his sleep is not adequate, he slept 2 hours last night. His sleep often interrupted. He notes occasional SOB. He can do activities if he makes himself do it.    REVIEW OF SYSTEMS:   Constitutional: Denies fevers, chills or abnormal weight loss (+) Trouble sleeping (+) Fatigue  Eyes: Denies blurriness of vision Ears, nose, mouth, throat, and face: Denies mucositis or sore throat Respiratory: Denies cough or wheezes (+) Occasional SOB  Cardiovascular: Denies palpitation, chest discomfort or lower extremity swelling Gastrointestinal:  Denies nausea, heartburn or change in bowel habits (+) Diffuse abdominal soreness Skin: Denies abnormal skin rashes Lymphatics: Denies new lymphadenopathy or easy bruising Neurological:Denies numbness, tingling or new weaknesses Behavioral/Psych: Mood is stable, no new changes  All other systems were reviewed with the patient and are negative.  MEDICAL HISTORY:  Past Medical History:  Diagnosis Date  . Abnormal MRI, lumbar spine (2015) 01/21/2016   FINDINGS:  L3-4: Tiny right foraminal and extra foraminal disc bulge adjacent to but not compressing the L3 nerve lateral to the neural foramen. L4-5: There is a small broad-based disc bulge. There is also hypertrophy of the ligamentum flavum and facet  joints, left more than right creating moderately severe  spinal stenosis and bilateral lateral recess stenosis, left greater than right. This appe  . Anxiety   . Back ache 05/06/2014  . Cervical pain 05/06/2014  . 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[place]) 06/23/2015  . COPD (chronic obstructive pulmonary disease) ([place])   . De Quervain's tenosynovitis, left 07/07/2017  . Depression   . Dystonia   . Epileptic disorder ([place]) 06/23/2015  . Extremity pain 05/06/2014  . Febrile seizures ([place])    child  . Foot pain 09/11/2014  . Gastric ulcer 06/23/2015  . Gout   . H/O neoplasm 06/23/2015  . Head injury 06/23/2015  . Hemorrhoids   . Hepatic cirrhosis ([place]) 06/23/2015  . Hepatitis B   . History of GI bleed   . Leukopenia   . Liver cancer ([place]) 08/2018  . Neurogenic pain 01/21/2016  . Nodule of upper lobe of left lung 12/02/2017  . Non-small cell cancer of left lung ([place]) 2019   Rad tx's.   . Obstructive sleep apnea 06/23/2015  . 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 Osteoarthritis of spine with radiculopathy, lumbosacral region 06/23/2015  . Peripheral neuropathy 06/23/2015  . Peripheral neuropathy ([place]) 93/81/0175  . Personal history of tobacco use, presenting hazards to health 01/09/2016  . Pulse irregularity   . Seizures ([place])   . Spinal stenosis   . Splenomegaly 05/30/2013  . Thrombocytopenia ([place]) 06/23/2015  . Uncomplicated opioid dependence ([place]) 06/23/2015    SURGICAL HISTORY: Past Surgical History:  Procedure Laterality Date  . ESOPHAGOGASTRODUODENOSCOPY  11/2013  . ESOPHAGOGASTRODUODENOSCOPY (EGD) WITH PROPOFOL N/A 06/23/2018   Procedure: ESOPHAGOGASTRODUODENOSCOPY (EGD) WITH PROPOFOL;  Surgeon: John Silvas, MD;  Location: [facility] ENDOSCOPY;  Service: Endoscopy;  Laterality: N/A;  . surgery for cervical neck fracture    . TOOTH EXTRACTION Right 09/28/2016    I have reviewed the social history and family history with the patient and they  are unchanged from previous note.  ALLERGIES:  is allergic to codeine; morphine; and duloxetine.  MEDICATIONS:  Current Outpatient Medications  Medication Sig Dispense Refill  . ALPRAZolam (XANAX) 0.25 MG tablet Take 0.25 mg by mouth at bedtime as needed for anxiety or sleep.     . Avatrombopag Maleate 20 MG TABS Take 40 mg by mouth daily. Take 3 tabs = 60 mg   daily (Patient taking differently: Take 20 mg by mouth 2 (two) times daily. ) 60 tablet 2  . baclofen (LIORESAL) 10 MG tablet Take 1 tablet (10 mg total) by mouth 3 (three) times daily. 270 tablet 0  . [START ON 06/30/2019] baclofen (LIORESAL) 10 MG tablet Take 1 tablet (10 mg total) by mouth 3 (three) times daily. 186 tablet 0  . [START ON 07/03/2019] bisacodyl (DULCOLAX) 5 MG EC tablet Take 1 tablet (5 mg total) by mouth daily as needed. 59 tablet 0  . bisacodyl (DULCOLAX) 5 MG EC tablet Take 1 tablet (5 mg total) by mouth daily as needed for moderate constipation. 90 tablet 0  . cetirizine (ZYRTEC) 10 MG tablet Take 10 mg by mouth daily.  3  . folic acid (FOLVITE) 1 MG tablet Take 1 mg by mouth daily.     Derrill Memo ON 07/03/2019] gabapentin (NEURONTIN) 600 MG tablet Take 1 tablet (600 mg total) by mouth 3 (three) times daily. 177 tablet 0  . gabapentin (NEURONTIN) 600 MG tablet Take 1 tablet (600 mg total) by mouth 3 (three) times daily. 270 tablet 0  . Magnesium 500 MG CAPS Take 500 mg by mouth 2 (  two) times a day.    Marland Kitchen omeprazole (PRILOSEC) 20 MG capsule Take 1 capsule (20 mg total) by mouth daily. 90 capsule 1  . ondansetron (ZOFRAN) 8 MG tablet Take 1 tablet (8 mg total) by mouth every 8 (eight) hours as needed for nausea or vomiting. 20 tablet 0  . oxyCODONE (OXY IR/ROXICODONE) 5 MG immediate release tablet Take 0.5 tablets (2.5 mg total) by mouth 2 (two) times daily. Must last 30 days 30 tablet 0  . [START ON 07/02/2019] oxyCODONE (OXY IR/ROXICODONE) 5 MG immediate release tablet Take 0.5 tablets (2.5 mg total) by mouth 2 (two)  times daily. Must last 30 days 30 tablet 0  . [START ON 08/01/2019] oxyCODONE (OXY IR/ROXICODONE) 5 MG immediate release tablet Take 0.5 tablets (2.5 mg total) by mouth 2 (two) times daily. Must last 30 days 30 tablet 0  . prochlorperazine (COMPAZINE) 10 MG tablet Take 1 tablet (10 mg total) by mouth every 6 (six) hours as needed for nausea or vomiting. 30 tablet 0  . capecitabine (XELODA) 500 MG tablet Take 4 tabs in morning and 3 tabs in evening for 14 days, then off for 7 days 98 tablet 0   No current facility-administered medications for this visit.     PHYSICAL EXAMINATION: ECOG PERFORMANCE STATUS: 2 - Symptomatic, <50% confined to bed  Vitals:   06/18/19 0907  BP: (!) 127/51  Pulse: (!) 59  Resp: 17  Temp: 98.2 F (36.8 C)  SpO2: 99%   Filed Weights   06/18/19 0907  Weight: 207 lb 1.6 oz (93.9 kg)    GENERAL:alert, no distress and comfortable SKIN: skin color, texture, turgor are normal, no rashes or significant lesions EYES: normal, Conjunctiva are pink and non-injected, sclera clear  NECK: supple, thyroid normal size, non-tender, without nodularity LYMPH:  no palpable lymphadenopathy in the cervical, axillary  LUNGS: clear to auscultation and percussion with normal breathing effort HEART: regular rate & rhythm and no murmurs and no lower extremity edema ABDOMEN:abdomen soft, non-tender and normal bowel sounds Musculoskeletal:no cyanosis of digits and no clubbing  NEURO: alert & oriented x 3 with fluent speech, no focal motor/sensory deficits  LABORATORY DATA:  I have reviewed the data as listed CBC Latest Ref Rng & Units 06/18/2019 05/31/2019 05/24/2019  WBC 4.0 - 10.5 K/uL 3.1(L) 3.3(L) 2.7(L)  Hemoglobin 13.0 - 17.0 g/dL 8.9(L) 9.3(L) 8.7(L)  Hematocrit 39.0 - 52.0 % 28.7(L) 30.1(L) 28.0(L)  Platelets 150 - 400 K/uL 148(L) 175 70(L)     CMP Latest Ref Rng & Units 06/18/2019 05/31/2019 05/24/2019  Glucose 70 - 99 mg/dL 98 105(H) 102(H)  BUN 8 - 23 mg/dL 6(L) 6(L)  10  Creatinine 0.61 - 1.24 mg/dL 0.78 0.76 0.76  Sodium 135 - 145 mmol/L 138 140 139  Potassium 3.5 - 5.1 mmol/L 4.4 3.9 4.0  Chloride 98 - 111 mmol/L 101 103 105  CO2 22 - 32 mmol/L '29 27 28  ' Calcium 8.9 - 10.3 mg/dL 8.9 8.9 8.7(L)  Total Protein 6.5 - 8.1 g/dL 7.5 7.4 7.0  Total Bilirubin 0.3 - 1.2 mg/dL 0.6 0.5 0.5  Alkaline Phos 38 - 126 U/L 85 89 83  AST 15 - 41 U/L '24 23 23  ' ALT 0 - 44 U/L '9 11 12      ' RADIOGRAPHIC STUDIES: I have personally reviewed the radiological images as listed and agreed with the findings in the report. No results found.   ASSESSMENT & PLAN:  John [name] is a 63 y.o. 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 His liver biopsy showed poorly differentiated adenocarcinoma, CK7 positive, other markers were negative. Upper endoscopy was negative for malignancy.PET scan otherwise negative for other primary tumor. -His biopsy was evaluated by our pathologistDr. Rogue Jury here, who feels this ismost consistent with cholangiocarcinoma, with possible component of hepatocellular carcinoma. -CancertypeIDtestshowed 59% chance ofpancreaticobiliary primary -He was seen by surgeon Dr. Zenia Resides at [facility] in 07/2018 who did not think he is a candidate for surgical resection and referredhim to IR for target liver therapy. -He underwentY90 radio embolization on12/19/19with Dr. Jearld Shines at [facility].  -Given hislivertumor is non-resectable, it's incurable at this stagebut still treatable.I started him onfirst linechemocisplatin and gemcitabineon 5/20/20q2weekswith dose reduction,given COVID-19and his thrombocytopenia.He has been tolerating moderately well, worse lately  -I personally reviewed and discussed his MRI abdomen and CT Chest from 06/14/19 which shows progression of metastatic adenopathy in the chest, stable treated left lung  nodule, and cancer progression in liver. Metastatic lymphadenopathy in abdomen is similar in the interval.  -Given disease progression to distant LNs his cancer is stage IV, he is no longer eligible for liver target therapy options. I reviewed overall poor prognosis due to poor response to first line chemo. I discussed the response rate of future treatments will be lower at this point as his disease is not very sensitive to chemotherapy.  -I discussed second line option, such as FOLFOX, or single agent Xeloda.  Due to overall moderate to poor tolerance to IV chemotherapy, history of acidosis, I think FOLFOX will be difficult for him to tolerate.  I recommend second-line oral Xeloda 820m/m2 ( 20031min the AM, 150029mn the PM) 2 weeks on/1 week off. I gave him print out information of this drug.   --Chemotherapy consent: Side effects including but does not limited to, fatigue, nausea, vomiting, diarrhea, hair loss, neuropathy, fluid retention, renal and kidney dysfunction, neutropenic fever, needed for blood transfusion, bleeding, were discussed with patient in great detail. He agrees to proceed. -Goal of therapy is palliative to control his disease and prolong his life.  -I discussed we will try to treat him for as long as he can tolerate, but I have low threshold to stop chemo if not tolerable given low benefit. At that point I would suggest we proceed with palliative care and symptom management alone. He understands and wants to try treatments for as long as he can tolerate. Pt seems to have difficulty to comprehense the complexity of his diease status, and not ready to stop treatment  -F/u in 2-3 weeks. He can contact clinic if he has unexpected or significant side effects from Xeloda.   2.Hepatitis B and liver cirrhosis -He doesn't see a hepatologist nor an infectious disease physician,he has never received hepatitis B treatment. -I previously encouraged him to return to GI Dr. EllTiffany Kocherr  management.  3. 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 -Given he is on chemotherapy, Ipreviouslyprescribed him Doptelet(Avatrombopag) 16m12mD.  -plt overall stable and moderately low to normal. Will continue Doptelet with Xeloda.   4. Left apical lungcancer stage IA,s/p SBRT, COPD -PET scanon 11/24/2017 revealed a hypermetabolic 1.0 cm left apical pulmonary nodulefavoring malignancy. Assuming non-small cell lung cancer this represents T1a N0 M0 disease (stage IA).Biopsy was not obtained -Underwent SBRT 01/05/18-02/01/18 -CT chest from 11/28/18 shows good improvement and no new nodules. -CT chest from 06/14/19 shows  progression of metastatic adenopathy in the chest, stable treated left lung nodule.   5. Social Support, Acupuncturist -He lives by himself.  -He has 2 brothers who helps him  -I discussed our resources to help with transportation if needed. -He notes increases in billing of his current treatment. I recommendedhe consult with his financial advocate Marguarite Arbour about this for more help. He is agreeable.  6. Chronic back pain, right flank and RUQ pain -He sees Dr. Dossie Arbour for pain management of chronic back pain. He has him on Oxycodone 2.54m BID. He is also on muscle relaxant Baclofen 112m .  -f/u with Dr. NaDossie Arbour-He notes his abdominal pain has been more diffuse and flares at night, likely due to disease progression to LNs. His Oxycodone does work on his back but not really controlling abdominal pain.   7. Insomnia  -Secondary to abdominal pain which is not well controlled.  -He does take Oxycodone at night as needed -I suggest he take OTC melatonin to help.   8. Goal of care discussion  -We again discussed the incurable nature of his cancer, and the overall poor prognosis, especially if he does not have good response to chemotherapy or progress on chemo -The  patient understands the goal of care is palliative. -I recommend DNR/DNI, he will think about it    PLAN: -D/c Gemcitabine and Cisplatin -I called in Xeloda today to start soon  -Lab today  -Lab and f/u in 2-3 weeks     No problem-specific Assessment & Plan notes found for this encounter.   No orders of the defined types were placed in this encounter.  All questions were answered. The patient knows to call the clinic with any problems, questions or concerns. No barriers to learning was detected. I spent 35 minutes counseling the patient face to face. The total time spent in the appointment was 40 minutes and more than 50% was on counseling and review of test results     YaTruitt MerleMD 06/18/2019   I, AmJoslyn Devonam acting as scribe for YaTruitt MerleMD.   I have reviewed the above documentation for accuracy and completeness, and I agree with the above.

## 2019-06-18 ENCOUNTER — Other Ambulatory Visit: Payer: Self-pay

## 2019-06-18 ENCOUNTER — Encounter: Payer: Self-pay | Admitting: Hematology

## 2019-06-18 ENCOUNTER — Inpatient Hospital Stay: Payer: Medicare Other | Attending: Hematology | Admitting: Hematology

## 2019-06-18 ENCOUNTER — Inpatient Hospital Stay: Payer: Medicare Other

## 2019-06-18 VITALS — BP 127/51 | HR 59 | Temp 98.2°F | Resp 17 | Ht 73.0 in | Wt 207.1 lb

## 2019-06-18 DIAGNOSIS — K7460 Unspecified cirrhosis of liver: Secondary | ICD-10-CM | POA: Insufficient documentation

## 2019-06-18 DIAGNOSIS — C221 Intrahepatic bile duct carcinoma: Secondary | ICD-10-CM

## 2019-06-18 DIAGNOSIS — K7030 Alcoholic cirrhosis of liver without ascites: Secondary | ICD-10-CM

## 2019-06-18 DIAGNOSIS — D6959 Other secondary thrombocytopenia: Secondary | ICD-10-CM | POA: Diagnosis not present

## 2019-06-18 DIAGNOSIS — C7802 Secondary malignant neoplasm of left lung: Secondary | ICD-10-CM | POA: Diagnosis not present

## 2019-06-18 DIAGNOSIS — R162 Hepatomegaly with splenomegaly, not elsewhere classified: Secondary | ICD-10-CM | POA: Insufficient documentation

## 2019-06-18 DIAGNOSIS — G8929 Other chronic pain: Secondary | ICD-10-CM | POA: Diagnosis not present

## 2019-06-18 DIAGNOSIS — R599 Enlarged lymph nodes, unspecified: Secondary | ICD-10-CM | POA: Diagnosis not present

## 2019-06-18 DIAGNOSIS — R1011 Right upper quadrant pain: Secondary | ICD-10-CM | POA: Insufficient documentation

## 2019-06-18 DIAGNOSIS — M549 Dorsalgia, unspecified: Secondary | ICD-10-CM | POA: Diagnosis not present

## 2019-06-18 DIAGNOSIS — D696 Thrombocytopenia, unspecified: Secondary | ICD-10-CM

## 2019-06-18 DIAGNOSIS — C771 Secondary and unspecified malignant neoplasm of intrathoracic lymph nodes: Secondary | ICD-10-CM | POA: Diagnosis not present

## 2019-06-18 DIAGNOSIS — C229 Malignant neoplasm of liver, not specified as primary or secondary: Secondary | ICD-10-CM

## 2019-06-18 LAB — CBC WITH DIFFERENTIAL (CANCER CENTER ONLY)
Abs Immature Granulocytes: 0.01 10*3/uL (ref 0.00–0.07)
Basophils Absolute: 0 10*3/uL (ref 0.0–0.1)
Basophils Relative: 0 %
Eosinophils Absolute: 0 10*3/uL (ref 0.0–0.5)
Eosinophils Relative: 1 %
HCT: 28.7 % — ABNORMAL LOW (ref 39.0–52.0)
Hemoglobin: 8.9 g/dL — ABNORMAL LOW (ref 13.0–17.0)
Immature Granulocytes: 0 %
Lymphocytes Relative: 11 %
Lymphs Abs: 0.3 10*3/uL — ABNORMAL LOW (ref 0.7–4.0)
MCH: 31.9 pg (ref 26.0–34.0)
MCHC: 31 g/dL (ref 30.0–36.0)
MCV: 102.9 fL — ABNORMAL HIGH (ref 80.0–100.0)
Monocytes Absolute: 0.4 10*3/uL (ref 0.1–1.0)
Monocytes Relative: 12 %
Neutro Abs: 2.4 10*3/uL (ref 1.7–7.7)
Neutrophils Relative %: 76 %
Platelet Count: 148 10*3/uL — ABNORMAL LOW (ref 150–400)
RBC: 2.79 MIL/uL — ABNORMAL LOW (ref 4.22–5.81)
RDW: 18.2 % — ABNORMAL HIGH (ref 11.5–15.5)
WBC Count: 3.1 10*3/uL — ABNORMAL LOW (ref 4.0–10.5)
nRBC: 0 % (ref 0.0–0.2)

## 2019-06-18 LAB — CMP (CANCER CENTER ONLY)
ALT: 9 U/L (ref 0–44)
AST: 24 U/L (ref 15–41)
Albumin: 3.5 g/dL (ref 3.5–5.0)
Alkaline Phosphatase: 85 U/L (ref 38–126)
Anion gap: 8 (ref 5–15)
BUN: 6 mg/dL — ABNORMAL LOW (ref 8–23)
CO2: 29 mmol/L (ref 22–32)
Calcium: 8.9 mg/dL (ref 8.9–10.3)
Chloride: 101 mmol/L (ref 98–111)
Creatinine: 0.78 mg/dL (ref 0.61–1.24)
GFR, Est AFR Am: >60 mL/min (ref >60)
GFR, Estimated: >60 mL/min (ref >60)
Glucose, Bld: 98 mg/dL (ref 70–99)
Potassium: 4.4 mmol/L (ref 3.5–5.1)
Sodium: 138 mmol/L (ref 135–145)
Total Bilirubin: 0.6 mg/dL (ref 0.3–1.2)
Total Protein: 7.5 g/dL (ref 6.5–8.1)

## 2019-06-18 LAB — MAGNESIUM: Magnesium: 2 mg/dL (ref 1.7–2.4)

## 2019-06-18 MED ORDER — CAPECITABINE 500 MG PO TABS: ORAL_TABLET | ORAL | 0 refills | Status: DC

## 2019-06-19 ENCOUNTER — Telehealth: Payer: Self-pay | Admitting: Pharmacist

## 2019-06-19 ENCOUNTER — Telehealth: Payer: Self-pay | Admitting: Hematology

## 2019-06-19 DIAGNOSIS — C221 Intrahepatic bile duct carcinoma: Secondary | ICD-10-CM

## 2019-06-19 MED ORDER — CAPECITABINE 500 MG PO TABS: ORAL_TABLET | ORAL | 0 refills | Status: DC

## 2019-06-19 NOTE — Telephone Encounter (Signed)
Scheduled appt per 10/12 los.  Spoke with patient and they are aware of the appt date and time.

## 2019-06-19 NOTE — Telephone Encounter (Signed)
Oral Chemotherapy Pharmacist Encounter   I spoke with patient for overview of: Xeloda (capecitabine) for the 2nd line treatment of metastatic intrahepatic cholangiocarcinoma, planned duration until disease progression or unacceptable toxicity.   Counseled patient on administration, dosing, side effects, monitoring, drug-food interactions, safe handling, storage, and disposal.  Patient will take Xeloda 500mg  tablets, 4 tablets (2000 mg) by mouth in AM and 3 tabs (1500 mg) by mouth in PM, within 30 minutes of finishing meals, on days 1-14 of each 21 day cycle.   Xeloda start date: 06/25/19  Adverse effects include but are not limited to: fatigue, decreased blood counts, GI upset, diarrhea, mouth sores, and hand-foot syndrome.  Patient has anti-emetic on hand and knows to take it if nausea develops.   Patient will obtain anti diarrheal and alert the office of 4 or more loose stools above baseline.  Reviewed with patient importance of keeping a medication schedule and plan for any missed doses.  Medication reconciliation performed and medication/allergy list updated.  Patient was inquiring when he can decrease his dose of avatrombopag. I instructed his to remain on 40mg  once daily through this 1st cycle of Xeloda. He is scheduled to have follow-up at [facility] on 11/3, which will be day 2 of his week "off". His dose may be able to be decreased if his platelet count holds steady. He was agreeable to this plan.  Insurance authorization for Xeloda has been obtained. Test claim at the pharmacy revealed copayment $54 for 1st fill of Xeloda. This will ship from the [employer] on 10/14 to deliver to patient's home on 10/15.  Patient informed the pharmacy will reach out 5-7 days prior to needing next fill of Xeloda to coordinate continued medication acquisition to prevent break in therapy.  All questions answered.  Mr. Hogeland voiced understanding and appreciation.   Patient  knows to call the office with questions or concerns.  Johny Drilling, PharmD, BCPS, BCOP  06/19/2019   1:50 PM Oral Oncology Clinic [phone]

## 2019-06-19 NOTE — Telephone Encounter (Signed)
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  Original diagnosis in Oct 2019 Patient received Y-90 radioembolization in Dec 2019 He then received front line systemic chemotherapy with cisplatin and gemcitabine x 8 cycles (01/24/19-05/31/19) that is now discontinued with disease progression Patient is under evaluation to initiate 2nd line systemic treatment with capecitabine monotherapy administered at ~800 mg/m2 BID (2000 mg in AM and 1500 mg in PM) for 14 days on, 7 days off, repeated every 21 days Patient is noted with decreased performance status and combination chemotherapy can not be safely administered at this time  Labs from 06/18/19 assessed, OK for treatment initiation. Noted pltc=148k, will be monitored  Current medication list in Epic reviewed, moderate DDIs with capecitabine identified:  Category C interaction with capecitabine and omeprazole: conflicting retrospective data with one analysis showing decrease in OS and PFS when capecitabine was used for adjuvant treatment of gastric cancer concurrently with PPI, another study showing no effect on important efficacy outcomes. Patient will be screened for ability to discontinue PPI therapy. If unable to d/c PPI, no change to treatment is indicated.  Category C interaction with capecitabine and folic acid:  Folic acid may enhance the adverse or toxic effect of fluorouracil products by potentiating its mechanism of action.  Patient will be instructed to hold folic acid during capecitabine administration, and it can be restarted at the completion or discontinuation of therapy.  Prescription has been e-scribed to the [facility] for benefits analysis and approval.  Oral Oncology Clinic will continue to follow for insurance authorization,  copayment issues, initial counseling and start date.  Johny Drilling, PharmD, BCPS, BCOP  06/19/2019 9:16 AM Oral Oncology Clinic [phone]

## 2019-06-20 MED FILL — CAPECITABINE 500 MG TABS: 500 | 21 days supply | Qty: 98 | Fill #0

## 2019-06-21 NOTE — Telephone Encounter (Signed)
Oral Oncology Patient Advocate Encounter  Confirmed with [place] that Xeloda was shipped on 06/20/19 with a $54.68 copay that covered by his [place].   [place] Patient [place] Phone [phone] Fax [phone] 06/21/2019   11:06 AM

## 2019-07-04 ENCOUNTER — Telehealth: Payer: Self-pay | Admitting: Pharmacist

## 2019-07-04 NOTE — Progress Notes (Signed)
[name]   [phone] [phone]   Clinic Follow up Note   Patient Care Team: Cletis Athens, MD as PCP - General (Internal Medicine) Manya Silvas, MD (Inactive) (Gastroenterology) Milinda Pointer, MD as Referring Physician (Pain Medicine)  Date of Service:  07/10/2019  CHIEF COMPLAINT: F/u ofadenocarcinoma in liver  SUMMARY OF ONCOLOGIC HISTORY: Oncology History  Cholangiocarcinoma ([place])  05/05/2018 Initial Biopsy   DIAGNOSIS: 05/05/18 A. LIVER MASS; ULTRASOUND-GUIDED BIOPSY:  - CYTOKERATIN 7 POSITIVE ADENOCARCINOMA WITH ABUNDANT NECROSIS, SEE  COMMENT.  Diagnosis 06/28/18 Consult- Comprehensive, [phone] Liver Bx - POORLY DIFFERENTIATED CARCINOMA INVOLVING LIVER PARENCHYMA. SEE NOTE. Diagnosis Note Most of the tumor shows a abortive glad formation, consistent with an adenocarcinoma. However, in some areas, the tumor cells show increased cytoplasm with a nested architecture, giving them a hepatoid appearance. Immunohistochemical stains performed at the outside institution show that the tumor cells are positive for CK7 and negative for TTF-1, Napsin-A, CDX-2, Hepar-1 and CD34. Immunostains performed at [facility] show that glypican-3 has patchy positive staining in the hepatoid-appearing tumor cells. Arginase and AFP stains show very focal, weak staining. Immunostain of pCEA shows focal canalicular-like staining pattern in the tumor cells. The findings are consistent with a cholangiocarcinoma but the the tumor histomorphology and staining pattern are suggestive of a hepatocellular carcinoma component, raising possibility of a combined hepatocellular cholangiocarcinoma. Additional biopsies or re-evaluation on the resection specimen is suggested for confirmation.   06/02/2018 PET scan   PET 06/02/18  IMPRESSION: 1. Solitary hypermetabolic lesion in the central LEFT hepatic lobe consistent with malignancy. 2. No metabolic activity  remaining in LEFT apical pulmonary nodule which is reduced in size. 3. No evidence of metastatic adenopathy on skull base to thigh FDG PET scan. 4. Cirrhotic liver.  Cholelithiasis 5.  Aortic Atherosclerosis (ICD10-I70.0).   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 Jearld Shines at [facility]  Estimated dose to liver target: 117 Gy Estimated dose to lung: 5.56 Gy Impression: Successful radioembolization with administration of intravascular yttrium-90 Theraspheres as described above.   10/18/2018 Imaging   US Abdomen 10/18/18 IMPRESSION: 1. Cholelithiasis without evidence of acute cholecystitis. 2. 2.2 cm left hepatic lobe mass consistent with known malignancy. 3. Cirrhosis and splenomegaly.   11/09/2018 Imaging   MRI Abdomen 11/09/18 at [facility]  Impression: Marked increased size of the mass centered in segment 4A with new satellite lesions seen in segments 2 and 3, likely representing multifocal mass-forming cholangiocarcinoma. New tumor invasion seen within the right anterior portal vein and middle hepatic vein to the level of the IVC, which is a pattern more commonly seen with hepatocellular carcinoma.   11/25/2018 Miscellaneous   Foundation One 3/21.2020  MSI -Stable - cannot be determined  Tumor Mutation Burden - cannot be determined  TERT promoter 124C>T TP53 [id number]   11/28/2018 Imaging   CT Chest 11/28/18  IMPRESSION: 1. Further improvement in left apical nodule, now nearly resolved. Mild surrounding radiation changes. 2. No new or enlarging pulmonary nodules. 3. Treated lesion in the left hepatic lobe is not optimally visualized on this noncontrast study, although appears slightly larger. 4. Progressive adenopathy in the upper abdomen. 5. Additional incidental findings are stable, including cirrhosis, splenomegaly, aortic valvular calcifications, Aortic Atherosclerosis (ICD10-I70.0) and  Emphysema (ICD10-J43.9).   12/25/2018 Cancer Staging   Staging form: Intrahepatic Bile Duct, AJCC 8th Edition - Clinical stage from 12/25/2018: Stage II (cT2(m), cN0, cM0) - Signed by [name], MD  on 12/27/2018   01/24/2019 - 06/18/2019 Chemotherapy   First line Cisplatin and Gemcitabine every 2 weeks starting 01/24/19  -He did have reaction to IV Emend and dexa with SOB, chest pain and flushing, will d/c and will switch dexa to Solu-Medrol on cycle 2.   -Stopped on 06/18/19 due to disease prgression.    03/28/2019 Imaging   MRI IMPRESSION: 1. Slight interval increase in size of the large necrotic central hepatic neoplasm. 2. Stable celiac axis lymph node and slightly enlarged left para-aortic, right hepatoduodenal ligament and epicardial lymph nodes. 3. 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 4. Stable splenomegaly.   06/14/2019 Imaging    CT Chest WO Contrast 06/14/19 IMPRESSION: 1. Progressive metastatic adenopathy in the chest involving right prevascular and anterior pericardiophrenic mediastinal nodes. Progressive metastatic adenopathy in the porta hepatis. 2. Stable treated apical left upper lobe pulmonary nodule with evolving surrounding postradiation changes. 3. Please see the separate concurrent MRI abdomen report from today for details regarding the left liver lobe mass.   Aortic Atherosclerosis (ICD10-I70.0) and Emphysema (ICD10-J43.9).   06/14/2019 Imaging    MRI abdomen 06/14/19  IMPRESSION: 1. Continued mild progression of the dominant left hepatic liver mass. 2. Metastatic lymphadenopathy in the anterior mediastinum, celiac axis, hepato duodenal ligament, and para-aortic space is similar in the interval. 3. 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 4. Non opacification of the left  portal vein suggests thrombosis. 5. Splenomegaly. 6. Cholelithiasis.   06/25/2019 -  Chemotherapy   Second-line oral Xeloda 2060m in the AM, 15014min the PM BID 2 weeks on/1 week off starting 06/25/19.       CURRENT THERAPY:  second line chemo with oral Xeloda 200024mn the AM and 1500m13m the PM 2 weeks on/1 week off starting 06/25/19. Due to skin toxicity dose reduced to 1500mg3m 1 week on and 1 week off starting with C2 on 07/16/19.   INTERVAL HISTORY:  StephKLINTON CANDELASere for a follow up and treatment. He presents to the clinic alone. He notes his feet hurting which started after first week of Xeloda. He has redness of palms and soles. He denies skin peeling. This has improved on his off week. He has been using lotions twice a day. The longer he puts pressure on feet the more it hurts, this includes walking. He was instructed to stop Xeloda on 10/28. He feels he also had worsened constipation. He has increased Doculax to twice a night. He also had nausea but controlled with antiemetics. He notes his pain in his feet prevented him from eating.     REVIEW OF SYSTEMS:   Constitutional: Denies fevers, chills or abnormal weight loss Eyes: Denies blurriness of vision Ears, nose, mouth, throat, and face: Denies mucositis or sore throat Respiratory: Denies cough, dyspnea or wheezes Cardiovascular: Denies palpitation, chest discomfort or lower extremity swelling Gastrointestinal:  Denies nausea, heartburn (+) Constipation Skin: Denies abnormal skin rashes (+) Hand foot syndrome, no skin peeling.  Lymphatics: Denies new lymphadenopathy or easy bruising Neurological:Denies numbness, tingling or new weaknesses Behavioral/Psych: Mood is stable, no new changes  All other systems were reviewed with the patient and are negative.  MEDICAL HISTORY:  Past Medical History:  Diagnosis Date   Abnormal MRI, lumbar spine (2015) 01/21/2016   FINDINGS:  L3-4: Tiny right foraminal and extra  foraminal disc bulge adjacent to but not compressing the L3 nerve lateral to the neural foramen. L4-5: There is a small broad-based disc bulge. There is also hypertrophy of the ligamentum flavum and facet joints, left more than right creating moderately severe spinal stenosis and bilateral lateral recess stenosis, left greater than right. This appe   Anxiety    Back ache 05/06/2014   Cervical pain 05/06/2014   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[place]) 06/23/2015   COPD (chronic obstructive pulmonary disease) ([place])    De Quervain's tenosynovitis, left 07/07/2017   Depression    Dystonia    Epileptic disorder ([place]) 06/23/2015   Extremity pain 05/06/2014   Febrile seizures ([place])    child   Foot pain 09/11/2014   Gastric ulcer 06/23/2015   Gout    H/O neoplasm 06/23/2015   Head injury 06/23/2015   Hemorrhoids    Hepatic cirrhosis ([place]) 06/23/2015   Hepatitis B    History of GI bleed    Leukopenia    Liver cancer ([place]) 08/2018   Neurogenic pain 01/21/2016   Nodule of upper lobe of left lung 12/02/2017   Non-small cell cancer of left lung ([place]) 2019   Rad tx's.    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) 24/23/5361   Uncomplicated opioid dependence ([place]) 06/23/2015    SURGICAL HISTORY: Past Surgical History:  Procedure Laterality Date   ESOPHAGOGASTRODUODENOSCOPY  11/2013   ESOPHAGOGASTRODUODENOSCOPY (EGD) WITH PROPOFOL N/A 06/23/2018   Procedure: ESOPHAGOGASTRODUODENOSCOPY (EGD) WITH PROPOFOL;  Surgeon: Manya Silvas, MD;  Location: [facility] ENDOSCOPY;   Service: Endoscopy;  Laterality: N/A;   surgery for cervical neck fracture     TOOTH EXTRACTION Right 09/28/2016    I have reviewed the social history and family history with the patient and they are unchanged from previous note.  ALLERGIES:  is allergic to codeine; morphine; and duloxetine.  MEDICATIONS:  Current Outpatient Medications  Medication Sig Dispense Refill   ALPRAZolam (XANAX) 0.25 MG tablet Take 0.25 mg by mouth at bedtime as needed for anxiety or sleep.      Avatrombopag Maleate 20 MG TABS Take 40 mg by mouth 2 (two) times daily. 120 tablet 1   baclofen (LIORESAL) 10 MG tablet Take 1 tablet (10 mg total) by mouth 3 (three) times daily. 186 tablet 0   bisacodyl (DULCOLAX) 5 MG EC tablet Take 1 tablet (5 mg total) by mouth daily as needed. 59 tablet 0   bisacodyl (DULCOLAX) 5 MG EC tablet Take 1 tablet (5 mg total) by mouth daily as needed for moderate constipation. 90 tablet 0   cetirizine (ZYRTEC) 10 MG tablet Take 10 mg by mouth daily.  3   folic acid (FOLVITE) 1 MG tablet Take 1 mg by mouth daily.      gabapentin (NEURONTIN) 600 MG tablet Take 1 tablet (600 mg total) by mouth 3 (three) times daily. 177 tablet 0   gabapentin (NEURONTIN) 600 MG tablet Take 1 tablet (600 mg total) by mouth 3 (three) times daily. 270 tablet 0   Magnesium 500 MG CAPS Take 500 mg by mouth 2 (two) times a day.  omeprazole (PRILOSEC) 20 MG capsule Take 1 capsule (20 mg total) by mouth daily. 90 capsule 1   ondansetron (ZOFRAN) 8 MG tablet Take 1 tablet (8 mg total) by mouth every 8 (eight) hours as needed for nausea or vomiting. 20 tablet 0   oxyCODONE (OXY IR/ROXICODONE) 5 MG immediate release tablet Take 0.5 tablets (2.5 mg total) by mouth 2 (two) times daily. Must last 30 days 30 tablet 0   [START ON 08/01/2019] oxyCODONE (OXY IR/ROXICODONE) 5 MG immediate release tablet Take 0.5 tablets (2.5 mg total) by mouth 2 (two) times daily. Must last 30 days 30 tablet 0    prochlorperazine (COMPAZINE) 10 MG tablet Take 1 tablet (10 mg total) by mouth every 6 (six) hours as needed for nausea or vomiting. 30 tablet 0   baclofen (LIORESAL) 10 MG tablet Take 1 tablet (10 mg total) by mouth 3 (three) times daily. 270 tablet 0   capecitabine (XELODA) 500 MG tablet Take 3 tabs (every 12 hours, immediately after food, for 7 days on, 7 days off, repeat every 14 days. Patient still has 31 pills at home from last cycle 53 tablet 0   oxyCODONE (OXY IR/ROXICODONE) 5 MG immediate release tablet Take 0.5 tablets (2.5 mg total) by mouth 2 (two) times daily. Must last 30 days 30 tablet 0   urea (CARMOL) 10 % cream Apply topically as needed. 71 g 0   No current facility-administered medications for this visit.     PHYSICAL EXAMINATION: ECOG PERFORMANCE STATUS: 2 - Symptomatic, <50% confined to bed  Vitals:   07/10/19 1045  BP: (!) 129/55  Pulse: (!) 57  Resp: 17  Temp: 98.5 F (36.9 C)  SpO2: 100%   Filed Weights   07/10/19 1045  Weight: 205 lb 9.6 oz (93.3 kg)    GENERAL:alert, no distress and comfortable SKIN: skin color, texture, turgor are normal, no rashes or significant lesions (+) Skin erythema of palms, no skin peeling EYES: normal, Conjunctiva are pink and non-injected, sclera clear  NECK: supple, thyroid normal size, non-tender, without nodularity LYMPH:  no palpable lymphadenopathy in the cervical, axillary  LUNGS: clear to auscultation and percussion with normal breathing effort HEART: regular rate & rhythm and no murmurs and no lower extremity edema ABDOMEN:abdomen soft, non-tender and normal bowel sounds Musculoskeletal:no cyanosis of digits and no clubbing  NEURO: alert & oriented x 3 with fluent speech, no focal motor/sensory deficits  LABORATORY DATA:  I have reviewed the data as listed CBC Latest Ref Rng & Units 07/10/2019 06/18/2019 05/31/2019  WBC 4.0 - 10.5 K/uL 3.2(L) 3.1(L) 3.3(L)  Hemoglobin 13.0 - 17.0 g/dL 8.8(L) 8.9(L) 9.3(L)    Hematocrit 39.0 - 52.0 % 28.8(L) 28.7(L) 30.1(L)  Platelets 150 - 400 K/uL 213 148(L) 175     CMP Latest Ref Rng & Units 07/10/2019 06/18/2019 05/31/2019  Glucose 70 - 99 mg/dL 100(H) 98 105(H)  BUN 8 - 23 mg/dL 7(L) 6(L) 6(L)  Creatinine 0.61 - 1.24 mg/dL 0.77 0.78 0.76  Sodium 135 - 145 mmol/L 138 138 140  Potassium 3.5 - 5.1 mmol/L 4.5 4.4 3.9  Chloride 98 - 111 mmol/L 103 101 103  CO2 22 - 32 mmol/L '28 29 27  ' Calcium 8.9 - 10.3 mg/dL 8.7(L) 8.9 8.9  Total Protein 6.5 - 8.1 g/dL 7.5 7.5 7.4  Total Bilirubin 0.3 - 1.2 mg/dL 0.6 0.6 0.5  Alkaline Phos 38 - 126 U/L 78 85 89  AST 15 - 41 U/L '30 24 23  ' ALT 0 -  44 U/L '11 9 11      ' RADIOGRAPHIC STUDIES: I have personally reviewed the radiological images as listed and agreed with the findings in the report. No results found.   ASSESSMENT & PLAN:  ISOM KOCHAN is a 63 y.o. 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 His liver biopsy showed poorly differentiated adenocarcinoma, CK7 positive, other markers were negative. No other primary tumor seen on scan or endoscopy.  -He was seen by surgeon Dr. Zenia Resides at [facility] in 07/2018 who did not think he is a candidate for surgical resection. He underwentY90 radio embolization on12/19/19with Dr. Jearld Shines at [facility].  -I started him onfirst linechemocisplatin and gemcitabineon 5/20/20q2weekswith dose reduction. However, he had disease progression on 06/14/19 scans and regimen was discontinued.  -He is currently on second line chemo with oral Xeloda 2062m in the AM and 15077min the PM 2 weeks on/1 week off starting 06/25/19. He wants to be on any treatment for as long as he can tolerate. I have low threshold to d/c given low benefit.  -He did not tolerate first cycle well with nausea, constipation and significant hand-foot syndrome after the first week. He stopped  Xeloda after 10 days on 10/28. Symptoms are improving. -His RUQ pain overall resolved since starting Xeloda. Labs reviewed and stable. His Thrombocytopenia is currently resolved. Overall adequate to start cycle 2 on 07/16/19 with reduce Xeloda at 150028mID 1 week on and 1 week off.  -Given his normal latest normal magnesium level he is fine to stop oral mag or reduce to once a day.  -f/u in 4 weeks   2.Hepatitis B and liver cirrhosis -He doesn't see a hepatologist nor an infectious disease physician,he has never received hepatitis B treatment. -I previously encouraged him to return to GI Dr. EllTiffany Kocherr management.  3. 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 -Given he is on chemotherapy, Ipreviouslyprescribed him Doptelet(Avatrombopag) 38m67mD. Will continue Doptelet with Xeloda.  -Given normalized PLT and dose reduction of Xeloda, will reduce Doptelet to 38mg48me daily (07/10/19). May stop if platelets continue to hold up.   4. Left apical lungcancer stage IA,s/p SBRT, COPD -PET scanon 11/24/2017 revealed a hypermetabolic 1.0 cm left apical pulmonary nodulefavoring malignancy. Assuming non-small cell lung cancer this represents T1a N0 M0 disease (stage IA).Biopsy was not obtained -Underwent SBRT 01/05/18-02/01/18 -Latest CT chest from 06/14/19 shows progression of metastatic adenopathy in the chest, stable treated left lung nodule. Will continue to monitor with scan.   5. Social Support, FinanAcupuncturistlives by himself.  -He has 2 brotherswho helps him -I discussed our resources to help with transportation if needed. -He notes increases in billing of his current treatment. I recommendedhe consult with his financial advocate ShaunMarguarite Arbourt this for more help. He is agreeable.  6. Chronic back pain, right flank and RUQ pain -He sees Dr. NaveiDossie Arbourpain management of  chronic back pain. Continue to f/u with him.  -He is on Oxycodone 2.5mg B14m He is also on muscle relaxant Baclofen 10mg. 28m RUQ pain has overall resolved since starting Xeloda.   7. Insomnia  -Secondary to abdominal pain which is not well controlled.  -He does take Oxycodone at night as needed. I suggest he take OTC melatonin to help.  -Abdominal pain has much improved, but did have worsening pain from hand-foot syndrome   8. Goal  of care discussion  -The patient understands the goal of care is palliative. -I recommend DNR/DNI, he will think about it   9. Nausea, Constipation, Hand-Foot Syndrome  -Secondary to Xeloda  -Will continue antiemetics and Doculax BID. He can increase if needed.  -I recommend OTC topical hydrocortisone cream for hands and feet. I will call in Urea cream (07/10/19), he can use if not too expensive.  -Dose reduced with C2.    PLAN: -I called in Urea cream today  -Labs reviewed and adequate to start C2 at reduced dose 1573m BID 1 week on/1 week off starting 11/9. I refilled today.  -Reduce Doptelet to 469monce daily.  -Lab and f/u in 4 weeks.    No problem-specific Assessment & Plan notes found for this encounter.   No orders of the defined types were placed in this encounter.  Pt had many questions and all questions were answered. The patient knows to call the clinic with any problems, questions or concerns. No barriers to learning was detected. I spent 30 minutes counseling the patient face to face. The total time spent in the appointment was 40 minutes and more than 50% was on counseling and review of test results     YaTruitt MerleMD 07/10/2019   I, AmJoslyn Devonam acting as scribe for YaTruitt MerleMD.   I have reviewed the above documentation for accuracy and completeness, and I agree with the above.

## 2019-07-04 NOTE — Telephone Encounter (Signed)
Oral Oncology Pharmacist Encounter  Received call from patient with multiple complaints after starting on Xeloda (capecitabine) on 06/25/2019.  Patient has not been experiencing any diarrhea in fact he has been experiencing constipation and has had to increase his use of bisacodyl in order to keep his bowels moving regularly. He has not increased the use of opioids or any other pain medication that would have been contributing to the slowing of his bowels. He states he is willing to continue using the bisacodyl to manage his bowels and understands that he may need less bisacodyl during his off weeks of the Xeloda.  Patient denies any skin changes on his hands and feet and states that he is using Eucerin several times a day in order to prevent skin changes. He does endorse progressing pain and lack of motility in his hands and feet that started this past Saturday, 06/30/2019, and continues to get worse over this week. He states he is not able to open bottles without the use of a towel, and it is quite difficult for him even still. He also states progressive pins-and-needles feeling in his feet which is impairing his ability to walk for any sort of distance. He says these issues are resolved for an hour or 2 after using the Eucerin or soaking his feet, but the pain returns quickly.  Patient also states that he supposed to take his last Xeloda tablets for this cycle this coming Sunday, 07/08/2019, and wonders when the pharmacy will be calling him to schedule his next fill of Xeloda.  Discussed above with MD. Dr. Burr Medico would like patient to discontinue the use of Xeloda for the remainder of the cycle. Patient has an office visit scheduled for 07/10/2019, will they will discuss resolution of the symptoms and plan for continued Xeloda dosing.  I relayed the above information to patient, he did take his last dose of Xeloda this morning, and will take no further doses until he is seen in the office on  07/10/2019.  The [facility] was plan to call patient this coming Monday, 07/09/2019, to coordinate next fill of Xeloda with patient. I have contacted the pharmacy to delay this call until after patient's office visit on 07/10/2019, and I have alerted the pharmacy that patient did discontinue the use of Xeloda prematurely the cycle.  All questions answered. Patient expressed understanding and appreciation. He knows to call the office with any additional questions or concerns.  Johny Drilling, PharmD, BCPS, BCOP  07/04/2019 11:03 AM [facility] [phone]

## 2019-07-05 ENCOUNTER — Telehealth: Payer: Self-pay

## 2019-07-05 ENCOUNTER — Other Ambulatory Visit: Payer: Self-pay

## 2019-07-05 DIAGNOSIS — D696 Thrombocytopenia, unspecified: Secondary | ICD-10-CM

## 2019-07-05 MED ORDER — AVATROMBOPAG MALEATE 20 MG PO TABS: 40.0000 mg | ORAL_TABLET | Freq: Two times a day (BID) | ORAL | 2 refills | Status: DC

## 2019-07-05 MED ORDER — AVATROMBOPAG MALEATE 20 MG PO TABS: 40.0000 mg | ORAL_TABLET | Freq: Two times a day (BID) | ORAL | 1 refills | Status: DC

## 2019-07-05 NOTE — Telephone Encounter (Signed)
Faxed new script for Doptelet to [facility] at [phone], received confirmation fax went through.

## 2019-07-09 ENCOUNTER — Telehealth: Payer: Self-pay

## 2019-07-09 NOTE — Telephone Encounter (Signed)
-----   Message from [name], MD sent at 07/08/2019 11:43 AM EST ----- Regarding: RE: avatrombopag Sorry about the confusion. Please let John [name] and pharmacy know his dose is 40mg  (2tab) once daily, and update the dose in his medication list.  Thanks   Krista Blue  ----- Message ----- From: Jesse Fall, RN Sent: 07/06/2019  12:05 PM EST To: Alla Feeling, NP, [name], MD Subject: avatrombopag                                   If either of you get this message, there is confusion about dosing on this med.  It is in twice with different doses.  [facility] is calling to verify dose.  They say that he was on 2 daily & now script is 2 BID & they state this is a high dose.  Please verify.  Thanks, [employer]

## 2019-07-09 NOTE — Telephone Encounter (Signed)
Spoke with [facility] regarding dose of Doptelet, explained that his dose should be 40 mg (once daily), they have corrected and will get out to the patient.  Phone [phone]

## 2019-07-10 ENCOUNTER — Other Ambulatory Visit: Payer: Self-pay

## 2019-07-10 ENCOUNTER — Inpatient Hospital Stay: Payer: Medicare Other | Attending: Hematology

## 2019-07-10 ENCOUNTER — Encounter: Payer: Self-pay | Admitting: Hematology

## 2019-07-10 ENCOUNTER — Inpatient Hospital Stay: Payer: Medicare Other | Admitting: Hematology

## 2019-07-10 ENCOUNTER — Telehealth: Payer: Self-pay | Admitting: Hematology

## 2019-07-10 VITALS — BP 129/55 | HR 57 | Temp 98.5°F | Resp 17 | Ht 73.0 in | Wt 205.6 lb

## 2019-07-10 DIAGNOSIS — C771 Secondary and unspecified malignant neoplasm of intrathoracic lymph nodes: Secondary | ICD-10-CM | POA: Insufficient documentation

## 2019-07-10 DIAGNOSIS — C221 Intrahepatic bile duct carcinoma: Secondary | ICD-10-CM

## 2019-07-10 DIAGNOSIS — C229 Malignant neoplasm of liver, not specified as primary or secondary: Secondary | ICD-10-CM

## 2019-07-10 DIAGNOSIS — G8929 Other chronic pain: Secondary | ICD-10-CM | POA: Insufficient documentation

## 2019-07-10 DIAGNOSIS — K7460 Unspecified cirrhosis of liver: Secondary | ICD-10-CM | POA: Diagnosis not present

## 2019-07-10 DIAGNOSIS — Z79899 Other long term (current) drug therapy: Secondary | ICD-10-CM | POA: Insufficient documentation

## 2019-07-10 DIAGNOSIS — D696 Thrombocytopenia, unspecified: Secondary | ICD-10-CM

## 2019-07-10 DIAGNOSIS — D6959 Other secondary thrombocytopenia: Secondary | ICD-10-CM | POA: Diagnosis not present

## 2019-07-10 DIAGNOSIS — K7030 Alcoholic cirrhosis of liver without ascites: Secondary | ICD-10-CM

## 2019-07-10 LAB — CBC WITH DIFFERENTIAL (CANCER CENTER ONLY)
Abs Immature Granulocytes: 0.01 10*3/uL (ref 0.00–0.07)
Basophils Absolute: 0 10*3/uL (ref 0.0–0.1)
Basophils Relative: 1 %
Eosinophils Absolute: 0.1 10*3/uL (ref 0.0–0.5)
Eosinophils Relative: 4 %
HCT: 28.8 % — ABNORMAL LOW (ref 39.0–52.0)
Hemoglobin: 8.8 g/dL — ABNORMAL LOW (ref 13.0–17.0)
Immature Granulocytes: 0 %
Lymphocytes Relative: 11 %
Lymphs Abs: 0.3 10*3/uL — ABNORMAL LOW (ref 0.7–4.0)
MCH: 31.9 pg (ref 26.0–34.0)
MCHC: 30.6 g/dL (ref 30.0–36.0)
MCV: 104.3 fL — ABNORMAL HIGH (ref 80.0–100.0)
Monocytes Absolute: 0.4 10*3/uL (ref 0.1–1.0)
Monocytes Relative: 12 %
Neutro Abs: 2.3 10*3/uL (ref 1.7–7.7)
Neutrophils Relative %: 72 %
Platelet Count: 213 10*3/uL (ref 150–400)
RBC: 2.76 MIL/uL — ABNORMAL LOW (ref 4.22–5.81)
RDW: 19.1 % — ABNORMAL HIGH (ref 11.5–15.5)
WBC Count: 3.2 10*3/uL — ABNORMAL LOW (ref 4.0–10.5)
nRBC: 0 % (ref 0.0–0.2)

## 2019-07-10 LAB — CMP (CANCER CENTER ONLY)
ALT: 11 U/L (ref 0–44)
AST: 30 U/L (ref 15–41)
Albumin: 3.5 g/dL (ref 3.5–5.0)
Alkaline Phosphatase: 78 U/L (ref 38–126)
Anion gap: 7 (ref 5–15)
BUN: 7 mg/dL — ABNORMAL LOW (ref 8–23)
CO2: 28 mmol/L (ref 22–32)
Calcium: 8.7 mg/dL — ABNORMAL LOW (ref 8.9–10.3)
Chloride: 103 mmol/L (ref 98–111)
Creatinine: 0.77 mg/dL (ref 0.61–1.24)
GFR, Est AFR Am: >60 mL/min (ref >60)
GFR, Estimated: >60 mL/min (ref >60)
Glucose, Bld: 100 mg/dL — ABNORMAL HIGH (ref 70–99)
Potassium: 4.5 mmol/L (ref 3.5–5.1)
Sodium: 138 mmol/L (ref 135–145)
Total Bilirubin: 0.6 mg/dL (ref 0.3–1.2)
Total Protein: 7.5 g/dL (ref 6.5–8.1)

## 2019-07-10 MED ORDER — UREA 10 % EX CREA: TOPICAL_CREAM | CUTANEOUS | 0 refills | Status: DC | PRN

## 2019-07-10 MED ORDER — CAPECITABINE 500 MG PO TABS: ORAL_TABLET | ORAL | 0 refills | Status: DC

## 2019-07-10 NOTE — Progress Notes (Signed)
Per Dr. Burr Medico: patient still has not received Doptelet prescription from [facility]. Called main number [phone] to inquire about status of prescription. Representative I spoke with stated that patient was not enrolled in Specialty Pharmacy services and therefore they did not have the prescription on file. I was then transferred to a pharmacist who took verbal order for the prescription (Doptetet 20 mg PO BID--decreased from 40 mg PO BID) and stated she would put a rush order on it so patient will hopefully receive later this week.   Will update pharmacy information in system so provider can e-scribe next time once I hear back from which location will be filling patient's prescription.

## 2019-07-10 NOTE — Telephone Encounter (Signed)
Scheduled appt per 11/3 los.  Left a vm of the appt date and time.

## 2019-07-11 LAB — CANCER ANTIGEN 19-9: CA 19-9: 40 U/mL — ABNORMAL HIGH (ref 0–35)

## 2019-07-17 MED FILL — CAPECITABINE 500 MG TABS: 500 | 8 days supply | Qty: 53 | Fill #0

## 2019-07-23 ENCOUNTER — Telehealth: Payer: Self-pay | Admitting: Pharmacist

## 2019-07-23 NOTE — Telephone Encounter (Signed)
Oral Oncology Pharmacist Encounter  Received call from [facility] with request for prescription clarification for patient's Doptelet. Patient had been receiving Doptelet prescription through manufacturer assistance, [facility].  I called manufacturer assistance program at [phone] to confirm that patient is still receiving his medication through their program. Patient is currently enrolled to receive Doptelet through manufacturer assistance program next fill is planned to ship out on 07/30/2019. He will need a new prescription for his fill in December as the fill that will ship out on 07/30/2019 is the last refill on his current prescription. Prescription should be faxed to [place] patient assistance program at [phone]. This program is not affiliated with [facility] in any way.  I called the patient to make sure he has received his Doptelet from the manufacturer assistance program. He said it showed up last week sometime. Patient informed that he should not be receiving his medicine from [facility] as his co-pay will be unaffordable at he remains enrolled with his manufacturer assistance program until the end of this year.  Patient stated that he just started his week "off" of capecitabine and was able to take 3 tablets (1500 mg) by mouth 2 times daily for 7 days. He states his foot discomfort has returned to the severity as when he was on a higher dose, and his skin is bright red, he is not able to get relief from anything that he tries. When I told patient that I would report the symptoms to Dr. Burr Medico patient did state that he wants to stay on the medicine as long as he can because he wants active treatment. Patient informed that this is a better conversation to have with Dr. Burr Medico and I would be updating her to his adverse events.  I then called [facility] and canceled patient's Doptelet prescription with them as it must have been sent there in  error.  Johny Drilling, PharmD, BCPS, BCOP  07/23/2019 1:22 PM [facility] [phone]

## 2019-07-26 ENCOUNTER — Other Ambulatory Visit: Payer: Self-pay | Admitting: Student in an Organized Health Care Education/Training Program

## 2019-07-26 ENCOUNTER — Other Ambulatory Visit: Payer: Self-pay | Admitting: Hematology

## 2019-07-26 DIAGNOSIS — M792 Neuralgia and neuritis, unspecified: Secondary | ICD-10-CM

## 2019-07-26 DIAGNOSIS — C221 Intrahepatic bile duct carcinoma: Secondary | ICD-10-CM

## 2019-07-30 MED FILL — CAPECITABINE 500 MG TABS: 500 | 28 days supply | Qty: 84 | Fill #0

## 2019-07-30 NOTE — Progress Notes (Signed)
[place]   [phone] [phone]   Clinic Follow up Note   Patient Care Team: Cletis Athens, MD as PCP - General (Internal Medicine) Manya Silvas, MD (Inactive) (Gastroenterology) Milinda Pointer, MD as Referring Physician (Pain Medicine)  Date of Service:  08/08/2019  CHIEF COMPLAINT: F/u ofadenocarcinoma in liver  SUMMARY OF ONCOLOGIC HISTORY: Oncology History  Cholangiocarcinoma ([place])  05/05/2018 Initial Biopsy   DIAGNOSIS: 05/05/18 A. LIVER MASS; ULTRASOUND-GUIDED BIOPSY:  - CYTOKERATIN 7 POSITIVE ADENOCARCINOMA WITH ABUNDANT NECROSIS, SEE  COMMENT.  Diagnosis 06/28/18 Consult- Comprehensive, [phone] Liver Bx - POORLY DIFFERENTIATED CARCINOMA INVOLVING LIVER PARENCHYMA. SEE NOTE. Diagnosis Note Most of the tumor shows a abortive glad formation, consistent with an adenocarcinoma. However, in some areas, the tumor cells show increased cytoplasm with a nested architecture, giving them a hepatoid appearance. Immunohistochemical stains performed at the outside institution show that the tumor cells are positive for CK7 and negative for TTF-1, Napsin-A, CDX-2, Hepar-1 and CD34. Immunostains performed at [facility] show that glypican-3 has patchy positive staining in the hepatoid-appearing tumor cells. Arginase and AFP stains show very focal, weak staining. Immunostain of pCEA shows focal canalicular-like staining pattern in the tumor cells. The findings are consistent with a cholangiocarcinoma but the the tumor histomorphology and staining pattern are suggestive of a hepatocellular carcinoma component, raising possibility of a combined hepatocellular cholangiocarcinoma. Additional biopsies or re-evaluation on the resection specimen is suggested for confirmation.   06/02/2018 PET scan   PET 06/02/18  IMPRESSION: 1. Solitary hypermetabolic lesion in the central LEFT hepatic lobe consistent with malignancy. 2. No metabolic activity  remaining in LEFT apical pulmonary nodule which is reduced in size. 3. No evidence of metastatic adenopathy on skull base to thigh FDG PET scan. 4. Cirrhotic liver.  Cholelithiasis 5.  Aortic Atherosclerosis (ICD10-I70.0).   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 Jearld Shines at [facility]  Estimated dose to liver target: 117 Gy Estimated dose to lung: 5.56 Gy Impression: Successful radioembolization with administration of intravascular yttrium-90 Theraspheres as described above.   10/18/2018 Imaging   US Abdomen 10/18/18 IMPRESSION: 1. Cholelithiasis without evidence of acute cholecystitis. 2. 2.2 cm left hepatic lobe mass consistent with known malignancy. 3. Cirrhosis and splenomegaly.   11/09/2018 Imaging   MRI Abdomen 11/09/18 at [facility]  Impression: Marked increased size of the mass centered in segment 4A with new satellite lesions seen in segments 2 and 3, likely representing multifocal mass-forming cholangiocarcinoma. New tumor invasion seen within the right anterior portal vein and middle hepatic vein to the level of the IVC, which is a pattern more commonly seen with hepatocellular carcinoma.   11/25/2018 Miscellaneous   Foundation One 3/21.2020  MSI -Stable - cannot be determined  Tumor Mutation Burden - cannot be determined  TERT promoter 124C>T TP53 [id number]   11/28/2018 Imaging   CT Chest 11/28/18  IMPRESSION: 1. Further improvement in left apical nodule, now nearly resolved. Mild surrounding radiation changes. 2. No new or enlarging pulmonary nodules. 3. Treated lesion in the left hepatic lobe is not optimally visualized on this noncontrast study, although appears slightly larger. 4. Progressive adenopathy in the upper abdomen. 5. Additional incidental findings are stable, including cirrhosis, splenomegaly, aortic valvular calcifications, Aortic Atherosclerosis (ICD10-I70.0) and  Emphysema (ICD10-J43.9).   12/25/2018 Cancer Staging   Staging form: Intrahepatic Bile Duct, AJCC 8th Edition - Clinical stage from 12/25/2018: Stage II (cT2(m), cN0, cM0) - Signed by [name], MD  on 12/27/2018   01/24/2019 - 06/18/2019 Chemotherapy   First line Cisplatin and Gemcitabine every 2 weeks starting 01/24/19  -He did have reaction to IV Emend and dexa with SOB, chest pain and flushing, will d/c and will switch dexa to Solu-Medrol on cycle 2.   -Stopped on 06/18/19 due to disease prgression.    03/28/2019 Imaging   MRI IMPRESSION: 1. Slight interval increase in size of the large necrotic central hepatic neoplasm. 2. Stable celiac axis lymph node and slightly enlarged left para-aortic, right hepatoduodenal ligament and epicardial lymph nodes. 3. 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 4. Stable splenomegaly.   06/14/2019 Imaging    CT Chest WO Contrast 06/14/19 IMPRESSION: 1. Progressive metastatic adenopathy in the chest involving right prevascular and anterior pericardiophrenic mediastinal nodes. Progressive metastatic adenopathy in the porta hepatis. 2. Stable treated apical left upper lobe pulmonary nodule with evolving surrounding postradiation changes. 3. Please see the separate concurrent MRI abdomen report from today for details regarding the left liver lobe mass.   Aortic Atherosclerosis (ICD10-I70.0) and Emphysema (ICD10-J43.9).   06/14/2019 Imaging    MRI abdomen 06/14/19  IMPRESSION: 1. Continued mild progression of the dominant left hepatic liver mass. 2. Metastatic lymphadenopathy in the anterior mediastinum, celiac axis, hepato duodenal ligament, and para-aortic space is similar in the interval. 3. 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 4. Non opacification of the left  portal vein suggests thrombosis. 5. Splenomegaly. 6. Cholelithiasis.   06/25/2019 -  Chemotherapy   Second-line oral Xeloda 2041m in the AM, 15042min the PM BID 2 weeks on/1 week off starting 06/25/19.       CURRENT THERAPY:  second line chemo with oral Xeloda 2000110mn the AM and 1500m83m the PM 2 weeks on/1 week off starting 06/25/19. Due to skin toxicity dose reduced to 1500mg20m 1 week on and 1 week off starting with C2 on 07/16/19. Due to hand foot syndrome with C3 will reduce Xeloda to 1500mg 71mM and 1000mg i75me PM starting C4 on 08/13/19.   INTERVAL HISTORY:  StephenYUVIN BUSSIEREe for a follow up of treatment. He presents to the clinic alone. He notes he is tolerating Xeloda 1 week on/1 week off except last few days of last cycle her reduced to 1000MG BID due to hand and foot syndrome. Since being off the last 3 days it has improved. He notes his RUQ pain is occasionally very sharp and then will go away after 30 seconds. Most of the time he does not have pain. He notes occasional nausea from MRI contrast.  He notes his PCP he is not open the clinic due to COVID [employer] not been able to refill his Gabapentin. He only takes as needed, mainly when he cant sleep.     REVIEW OF SYSTEMS:   Constitutional: Denies fevers, chills or abnormal weight loss Eyes: Denies blurriness of vision Ears, nose, mouth, throat, and face: Denies mucositis or sore throat Respiratory: Denies cough, dyspnea or wheezes Cardiovascular: Denies palpitation, chest discomfort or lower extremity swelling Gastrointestinal:  Denies nausea, heartburn or change in bowel habits (+) RUQ pain occasional now  Skin: Denies abnormal skin rashes (+) hand foot syndrome with skin erythema and dry skin Lymphatics: Denies new lymphadenopathy or easy bruising Neurological:Denies numbness, tingling or new weaknesses Behavioral/Psych: Mood  is stable, no new changes  All other systems were reviewed with the patient and are  negative.  MEDICAL HISTORY:  Past Medical History:  Diagnosis Date  . Abnormal MRI, lumbar spine (2015) 01/21/2016   FINDINGS:  L3-4: Tiny right foraminal and extra foraminal disc bulge adjacent to but not compressing the L3 nerve lateral to the neural foramen. L4-5: There is a small broad-based disc bulge. There is also hypertrophy of the ligamentum flavum and facet joints, left more than right creating moderately severe spinal stenosis and bilateral lateral recess stenosis, left greater than right. This appe  . Anxiety   . Back ache 05/06/2014  . Cervical pain 05/06/2014  . 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[place]) 06/23/2015  . COPD (chronic obstructive pulmonary disease) ([place])   . De Quervain's tenosynovitis, left 07/07/2017  . Depression   . Dystonia   . Epileptic disorder ([place]) 06/23/2015  . Extremity pain 05/06/2014  . Febrile seizures ([place])    child  . Foot pain 09/11/2014  . Gastric ulcer 06/23/2015  . Gout   . H/O neoplasm 06/23/2015  . Head injury 06/23/2015  . Hemorrhoids   . Hepatic cirrhosis ([place]) 06/23/2015  . Hepatitis B   . History of GI bleed   . Leukopenia   . Liver cancer ([place]) 08/2018  . Neurogenic pain 01/21/2016  . Nodule of upper lobe of left lung 12/02/2017  . Non-small cell cancer of left lung ([place]) 2019   Rad tx's.   . Obstructive sleep apnea 06/23/2015  . 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 Osteoarthritis of spine with radiculopathy, lumbosacral region 06/23/2015  . Peripheral neuropathy 06/23/2015  . Peripheral neuropathy ([place]) 09/98/3382  . Personal history of tobacco use, presenting hazards to health 01/09/2016  . Pulse irregularity   . Seizures ([place])   . Spinal stenosis   . Splenomegaly 05/30/2013  . Thrombocytopenia ([place]) 06/23/2015  . Uncomplicated opioid dependence ([place]) 06/23/2015    SURGICAL HISTORY: Past Surgical History:  Procedure Laterality Date  . ESOPHAGOGASTRODUODENOSCOPY  11/2013  .  ESOPHAGOGASTRODUODENOSCOPY (EGD) WITH PROPOFOL N/A 06/23/2018   Procedure: ESOPHAGOGASTRODUODENOSCOPY (EGD) WITH PROPOFOL;  Surgeon: Manya Silvas, MD;  Location: [facility] ENDOSCOPY;  Service: Endoscopy;  Laterality: N/A;  . surgery for cervical neck fracture    . TOOTH EXTRACTION Right 09/28/2016    I have reviewed the social history and family history with the patient and they are unchanged from previous note.  ALLERGIES:  is allergic to codeine; morphine; and duloxetine.  MEDICATIONS:  Current Outpatient Medications  Medication Sig Dispense Refill  . ALPRAZolam (XANAX) 0.25 MG tablet Take 1 tablet (0.25 mg total) by mouth at bedtime as needed for anxiety or sleep. 30 tablet 0  . Avatrombopag Maleate 20 MG TABS Take 40 mg by mouth 2 (two) times daily. 120 tablet 1  . baclofen (LIORESAL) 10 MG tablet Take 1 tablet (10 mg total) by mouth 3 (three) times daily. 270 tablet 0  . baclofen (LIORESAL) 10 MG tablet Take 1 tablet (10 mg total) by mouth 3 (three) times daily. 186 tablet 0  . bisacodyl (DULCOLAX) 5 MG EC tablet Take 1 tablet (5 mg total) by mouth daily as needed. 59 tablet 0  . bisacodyl (DULCOLAX) 5 MG EC tablet Take 1 tablet (5 mg total) by mouth daily as needed for moderate constipation. 90 tablet 0  . capecitabine (XELODA) 500 MG tablet TAKE 3 TABS (EVERY 12 HOURS, IMMEDIATELY AFTER FOOD, FOR 7 DAYS ON, 7 DAYS OFF, REPEAT EVERY  14 DAYS 84 tablet 0  . cetirizine (ZYRTEC) 10 MG tablet Take 10 mg by mouth daily.  3  . folic acid (FOLVITE) 1 MG tablet Take 1 mg by mouth daily.     Marland Kitchen gabapentin (NEURONTIN) 600 MG tablet Take 1 tablet (600 mg total) by mouth 3 (three) times daily. 177 tablet 0  . gabapentin (NEURONTIN) 600 MG tablet Take 1 tablet (600 mg total) by mouth 3 (three) times daily. 270 tablet 0  . Magnesium 500 MG CAPS Take 500 mg by mouth 2 (two) times a day.    Marland Kitchen omeprazole (PRILOSEC) 20 MG capsule Take 1 capsule (20 mg total) by mouth daily. 90 capsule 1  . ondansetron  (ZOFRAN) 8 MG tablet Take 1 tablet (8 mg total) by mouth every 8 (eight) hours as needed for nausea or vomiting. 20 tablet 0  . oxyCODONE (OXY IR/ROXICODONE) 5 MG immediate release tablet Take 0.5 tablets (2.5 mg total) by mouth 2 (two) times daily. Must last 30 days 30 tablet 0  . oxyCODONE (OXY IR/ROXICODONE) 5 MG immediate release tablet Take 0.5 tablets (2.5 mg total) by mouth 2 (two) times daily. Must last 30 days 30 tablet 0  . oxyCODONE (OXY IR/ROXICODONE) 5 MG immediate release tablet Take 0.5 tablets (2.5 mg total) by mouth 2 (two) times daily. Must last 30 days 30 tablet 0  . prochlorperazine (COMPAZINE) 10 MG tablet Take 1 tablet (10 mg total) by mouth every 6 (six) hours as needed for nausea or vomiting. 30 tablet 0  . urea (CARMOL) 10 % cream Apply topically as needed. 71 g 0   No current facility-administered medications for this visit.     PHYSICAL EXAMINATION: ECOG PERFORMANCE STATUS: 2 - Symptomatic, <50% confined to bed  Vitals:   08/08/19 1252  BP: (!) 106/46  Pulse: (!) 46  Resp: 16  Temp: 98.7 F (37.1 C)  SpO2: 100%   Filed Weights   08/08/19 1252  Weight: 205 lb (93 kg)    GENERAL:alert, no distress and comfortable SKIN: skin color, texture, turgor are normal, no rashes or significant lesions EYES: normal, Conjunctiva are pink and non-injected, sclera clear  NECK: supple, thyroid normal size, non-tender, without nodularity LYMPH:  no palpable lymphadenopathy in the cervical, axillary  LUNGS: clear to auscultation and percussion with normal breathing effort HEART: regular rate & rhythm and no murmurs and no lower extremity edema ABDOMEN:abdomen soft, non-tender and normal bowel sounds Musculoskeletal:no cyanosis of digits and no clubbing  NEURO: alert & oriented x 3 with fluent speech, no focal motor/sensory deficits  LABORATORY DATA:  I have reviewed the data as listed CBC Latest Ref Rng & Units 08/08/2019 07/10/2019 06/18/2019  WBC 4.0 - 10.5 K/uL 2.9(L)  3.2(L) 3.1(L)  Hemoglobin 13.0 - 17.0 g/dL 10.0(L) 8.8(L) 8.9(L)  Hematocrit 39.0 - 52.0 % 32.3(L) 28.8(L) 28.7(L)  Platelets 150 - 400 K/uL 152 213 148(L)     CMP Latest Ref Rng & Units 08/08/2019 07/10/2019 06/18/2019  Glucose 70 - 99 mg/dL 96 100(H) 98  BUN 8 - 23 mg/dL 9 7(L) 6(L)  Creatinine 0.61 - 1.24 mg/dL 0.79 0.77 0.78  Sodium 135 - 145 mmol/L 138 138 138  Potassium 3.5 - 5.1 mmol/L 4.0 4.5 4.4  Chloride 98 - 111 mmol/L 102 103 101  CO2 22 - 32 mmol/L '26 28 29  ' Calcium 8.9 - 10.3 mg/dL 9.0 8.7(L) 8.9  Total Protein 6.5 - 8.1 g/dL 8.0 7.5 7.5  Total Bilirubin 0.3 - 1.2 mg/dL 0.7  0.6 0.6  Alkaline Phos 38 - 126 U/L 84 78 85  AST 15 - 41 U/L '27 30 24  ' ALT 0 - 44 U/L '12 11 9      ' RADIOGRAPHIC STUDIES: I have personally reviewed the radiological images as listed and agreed with the findings in the report. No results found.   ASSESSMENT & PLAN:  John [name] is a 63 y.o. 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 His liver biopsy showed poorly differentiated adenocarcinoma, CK7 positive, other markers were negative. No other primary tumor seen on scan or endoscopy.  -He was seen by surgeon Dr. Zenia Resides at [facility] in 07/2018 who did notthink he is a candidate for surgical resection. He underwentY90 radio embolization on12/19/19with Dr. Jearld Shines [facility]  -he has progressed through first linechemocisplatin and gemcitabine -He started second line chemo with oral Xeloda 2063m in the AM and 15039min the PM 2 weeks on/1 week off starting 06/25/19. starting with C2 on 07/16/19 Xeloda was reduced to 150074mID 1 week on and 1 week off. He wants to be on any treatment for as long as he can tolerate. I have low threshold to d/c given low benefit.  -Due to progressing hand and foot syndrome he reduced his Xeloda for last 3 days of C3 to 1000m52mD. For current  week off this is improving.  -Labs reviewed, mostly stable and adequate to proceed with treatment. Will reduce Xeloda to 1500mg73mthe AM and 1000mg 44mhe PM 1 week on/1 week off starting with C4 on 12/7.  -Plan to repeat scan with CT CAP next month.  -F/u in 4 weeks   2.Hepatitis B and liver cirrhosis -He doesn't see a hepatologist nor an infectious disease physician,he has never received hepatitis B treatment. -Ipreviouslyencouraged him to return to GI Dr. ElliotTiffany Kocheranagement.  3. 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 -Given he is on chemotherapy, Ipreviouslyprescribed him Doptelet(Avatrombopag) 40mg B33mWill continue Doptelet with Xeloda. -Given normalized PLT and dose reduction of Xeloda, we previously reduced Doptelet to 20mg on84maily (07/10/19).   4. Left apical lungcancer stage IA,s/p SBRT, COPD -PET scanon 11/24/2017 revealed a hypermetabolic 1.0 cm left apical pulmonary nodulefavoring malignancy. Assuming non-small cell lung cancer this represents T1a N0 M0 disease (stage IA).Biopsy was not obtained -Underwent SBRT 01/05/18-02/01/18 -Latest CT chest from 06/14/19 showsprogression of metastatic adenopathy in the chest, stable treated left lung nodule.Will continue to monitor with scan.   5. Social Support, FinanciaAcupuncturistes by himself.  -He has 2 brotherswho helps him -I discussed our resources to help with transportation if needed. -He notes increases in billing of his current treatment. I recommendedhe consult with his financial advocate Shauna aMarguarite Arbourhis for more help. He is agreeable.  6. Chronic back pain, right flank and RUQ pain -He sees Dr. Naveira Dossie Arbourn management of chronic back pain. Continue to f/u with him.  -He is on Oxycodone 2.5mg BID.22m is also on muscle relaxant Baclofen 10mg. -Hi33mQ pain has overall resolved and only  occasional since starting Xeloda.   7.Insomnia -Secondary to abdominal pain which is notwellcontrolled.  -He does take Oxycodone at night as needed. I suggest he take OTC melatonin to help. -Abdominal pain has much improved, but did have worsening pain from hand-foot syndrome.  -Given he cannot reach his PCP, I will refill his  Xanax #30 tablets today (08/08/19). He can continue to use only as needed.    8.Goal of care discussion  -The patient understands the goal of care is palliative. -I recommend DNR/DNI, he will think about it  9. Nausea, Constipation, Hand-Foot Syndrome  -Secondary to Xeloda  -Will continue antiemetics and Doculax BID. He can increase if needed.  -Continue OTC topical hydrocortisone cream, Urea cream.  -Dose reduced with C2 and again with C3    PLAN: -I refilled Xanax today (08/08/19) -Labs reviewed and adequate to proceed with dose reduced Xeloda at 1556m in the AM and 10047min the PM 1 week on/1 week off starting with C4 on 12/7.  -Continue Doptelet to 2040mnce daily.  -F/u in 4 weeks with labs and CT CAP  W [place] No problem-specific Assessment & Plan notes found for this encounter.   Orders Placed This Encounter  Procedures  . CT Abdomen Pelvis W Contrast    Standing Status:   Future    Standing Expiration Date:   08/07/2020    Order Specific Question:   If indicated for the ordered procedure, I authorize the administration of contrast media per Radiology protocol    Answer:   Yes    Order Specific Question:   Preferred imaging location?    Answer:   [facility]    Order Specific Question:   Is Oral Contrast requested for this exam?    Answer:   Yes, Per Radiology protocol    Order Specific Question:   Radiology Contrast Protocol - do NOT remove file path    Answer:   \\[email or web address]  . CT Chest W Contrast    Standing Status:   Future    Standing Expiration  Date:   08/07/2020    Order Specific Question:   If indicated for the ordered procedure, I authorize the administration of contrast media per Radiology protocol    Answer:   Yes    Order Specific Question:   Preferred imaging location?    Answer:   [facility]    Order Specific Question:   Radiology Contrast Protocol - do NOT remove file path    Answer:   \\[email or web address]   All questions were answered. The patient knows to call the clinic with any problems, questions or concerns. No barriers to learning was detected. I spent 20 minutes counseling the patient face to face. The total time spent in the appointment was 25 minutes and more than 50% was on counseling and review of test results     YanTruitt MerleD 08/08/2019   I, AmoJoslyn Devonm acting as scribe for YanTruitt MerleD.   I have reviewed the above documentation for accuracy and completeness, and I agree with the above.

## 2019-08-08 ENCOUNTER — Inpatient Hospital Stay: Payer: Medicare Other | Attending: Hematology

## 2019-08-08 ENCOUNTER — Other Ambulatory Visit: Payer: Self-pay

## 2019-08-08 ENCOUNTER — Encounter: Payer: Self-pay | Admitting: Hematology

## 2019-08-08 ENCOUNTER — Inpatient Hospital Stay (HOSPITAL_BASED_OUTPATIENT_CLINIC_OR_DEPARTMENT_OTHER): Payer: Medicare Other | Admitting: Hematology

## 2019-08-08 VITALS — BP 106/46 | HR 46 | Temp 98.7°F | Resp 16 | Wt 205.0 lb

## 2019-08-08 DIAGNOSIS — K5900 Constipation, unspecified: Secondary | ICD-10-CM | POA: Diagnosis not present

## 2019-08-08 DIAGNOSIS — K7460 Unspecified cirrhosis of liver: Secondary | ICD-10-CM | POA: Diagnosis not present

## 2019-08-08 DIAGNOSIS — L271 Localized skin eruption due to drugs and medicaments taken internally: Secondary | ICD-10-CM | POA: Diagnosis not present

## 2019-08-08 DIAGNOSIS — Z923 Personal history of irradiation: Secondary | ICD-10-CM | POA: Insufficient documentation

## 2019-08-08 DIAGNOSIS — C229 Malignant neoplasm of liver, not specified as primary or secondary: Secondary | ICD-10-CM

## 2019-08-08 DIAGNOSIS — R161 Splenomegaly, not elsewhere classified: Secondary | ICD-10-CM | POA: Insufficient documentation

## 2019-08-08 DIAGNOSIS — D696 Thrombocytopenia, unspecified: Secondary | ICD-10-CM | POA: Diagnosis not present

## 2019-08-08 DIAGNOSIS — B1910 Unspecified viral hepatitis B without hepatic coma: Secondary | ICD-10-CM | POA: Insufficient documentation

## 2019-08-08 DIAGNOSIS — G8929 Other chronic pain: Secondary | ICD-10-CM | POA: Insufficient documentation

## 2019-08-08 DIAGNOSIS — G4700 Insomnia, unspecified: Secondary | ICD-10-CM | POA: Insufficient documentation

## 2019-08-08 DIAGNOSIS — J449 Chronic obstructive pulmonary disease, unspecified: Secondary | ICD-10-CM | POA: Insufficient documentation

## 2019-08-08 DIAGNOSIS — M549 Dorsalgia, unspecified: Secondary | ICD-10-CM | POA: Diagnosis not present

## 2019-08-08 DIAGNOSIS — C221 Intrahepatic bile duct carcinoma: Secondary | ICD-10-CM | POA: Insufficient documentation

## 2019-08-08 DIAGNOSIS — C3492 Malignant neoplasm of unspecified part of left bronchus or lung: Secondary | ICD-10-CM | POA: Diagnosis not present

## 2019-08-08 DIAGNOSIS — D6959 Other secondary thrombocytopenia: Secondary | ICD-10-CM | POA: Diagnosis not present

## 2019-08-08 DIAGNOSIS — K7030 Alcoholic cirrhosis of liver without ascites: Secondary | ICD-10-CM

## 2019-08-08 DIAGNOSIS — R110 Nausea: Secondary | ICD-10-CM | POA: Insufficient documentation

## 2019-08-08 LAB — CBC WITH DIFFERENTIAL (CANCER CENTER ONLY)
Abs Immature Granulocytes: 0 10*3/uL (ref 0.00–0.07)
Basophils Absolute: 0 10*3/uL (ref 0.0–0.1)
Basophils Relative: 0 %
Eosinophils Absolute: 0.1 10*3/uL (ref 0.0–0.5)
Eosinophils Relative: 3 %
HCT: 32.3 % — ABNORMAL LOW (ref 39.0–52.0)
Hemoglobin: 10 g/dL — ABNORMAL LOW (ref 13.0–17.0)
Immature Granulocytes: 0 %
Lymphocytes Relative: 11 %
Lymphs Abs: 0.3 10*3/uL — ABNORMAL LOW (ref 0.7–4.0)
MCH: 32.2 pg (ref 26.0–34.0)
MCHC: 31 g/dL (ref 30.0–36.0)
MCV: 103.9 fL — ABNORMAL HIGH (ref 80.0–100.0)
Monocytes Absolute: 0.3 10*3/uL (ref 0.1–1.0)
Monocytes Relative: 11 %
Neutro Abs: 2.2 10*3/uL (ref 1.7–7.7)
Neutrophils Relative %: 75 %
Platelet Count: 152 10*3/uL (ref 150–400)
RBC: 3.11 MIL/uL — ABNORMAL LOW (ref 4.22–5.81)
RDW: 19.3 % — ABNORMAL HIGH (ref 11.5–15.5)
WBC Count: 2.9 10*3/uL — ABNORMAL LOW (ref 4.0–10.5)
nRBC: 0 % (ref 0.0–0.2)

## 2019-08-08 LAB — CMP (CANCER CENTER ONLY)
ALT: 12 U/L (ref 0–44)
AST: 27 U/L (ref 15–41)
Albumin: 3.8 g/dL (ref 3.5–5.0)
Alkaline Phosphatase: 84 U/L (ref 38–126)
Anion gap: 10 (ref 5–15)
BUN: 9 mg/dL (ref 8–23)
CO2: 26 mmol/L (ref 22–32)
Calcium: 9 mg/dL (ref 8.9–10.3)
Chloride: 102 mmol/L (ref 98–111)
Creatinine: 0.79 mg/dL (ref 0.61–1.24)
GFR, Est AFR Am: >60 mL/min (ref >60)
GFR, Estimated: >60 mL/min (ref >60)
Glucose, Bld: 96 mg/dL (ref 70–99)
Potassium: 4 mmol/L (ref 3.5–5.1)
Sodium: 138 mmol/L (ref 135–145)
Total Bilirubin: 0.7 mg/dL (ref 0.3–1.2)
Total Protein: 8 g/dL (ref 6.5–8.1)

## 2019-08-08 MED ORDER — ALPRAZOLAM 0.25 MG PO TABS: 0.2500 mg | ORAL_TABLET | Freq: Every evening | ORAL | 0 refills | Status: DC | PRN

## 2019-08-09 ENCOUNTER — Telehealth: Payer: Self-pay | Admitting: Hematology

## 2019-08-09 NOTE — Telephone Encounter (Signed)
Scheduled appt per 12/2 los. ° °Spoke with pt and he is aware of his appt date and time. °

## 2019-08-10 ENCOUNTER — Telehealth: Payer: Self-pay | Admitting: Hematology

## 2019-08-10 NOTE — Telephone Encounter (Signed)
Returned patient's phone call regarding rescheduling 12/31 appointment, left a voicemail.

## 2019-08-10 NOTE — Telephone Encounter (Signed)
Patient called regarding voicemail that was left, rescheduled 12/31 to 01/06 per patient's request.

## 2019-08-21 ENCOUNTER — Encounter: Payer: Self-pay | Admitting: Pain Medicine

## 2019-08-21 NOTE — Progress Notes (Signed)
Disclaimer: In compliance with Federal Rules mandating open notes, implemented by the [facility], these notes are made available to patients through the electronic medical record. Information contained herein reflects the providers review of information obtained during the pre-charting review of records, as well as notes taken during the patients appointment.  Although all data contained herein was reviewed by the provider, unless specifically stated, due to time constrains, not all of it was discussed with the patient during the appointment. Specific medical language and abbreviations used within medical records is meant to communicate precise data to individuals trained to interpret such data.  Warning: These encounter notes are not meant to serve the purpose of providing patients with clear and concise information on their conditions, treatment plan, or specific risks and possible complications. Such information is provided to patients under the encounter's "Patient Information" section. Interpretation of the information contained herein should be left to individuals with the necessary training to understand the data.  Pain Management Virtual Encounter Note - Virtual Visit via Telephone Telehealth (real-time audio visits between healthcare provider and patient).   Patient's Phone No. & Preferred Pharmacy:  [phone] (home); There is no such number on file (mobile).; (Preferred) [phone] No e-mail address on record  [id number] - [place], [place] [address] Phone: [phone] Fax: [place], [place] [address] [place] [address] Phone: [phone] Fax: [phone]  [place], [place] [facility] [address] [place] [address] [place] [address] Phone: [phone] Fax:  [phone]    Pre-screening note:  Our staff contacted John [name] and offered him an "in person", "face-to-face" appointment versus a telephone encounter. He indicated preferring the telephone encounter, at this time.   Reason for Virtual Visit: COVID-19*  Social distancing based on CDC and AMA recommendations.   I contacted John [name] on 08/22/2019 via telephone.      I clearly identified myself as John Cola, MD. I verified that I was speaking with the correct person using two identifiers (Name: John [name], and date of birth: [birth year]).  Advanced Informed Consent I sought verbal advanced consent from John [name] for virtual visit interactions. I informed John [name] of possible security and privacy concerns, risks, and limitations associated with providing "not-in-person" medical evaluation and management services. I also informed John [name] of the availability of "in-person" appointments. Finally, I informed him that there would be a charge for the virtual visit and that he could be  personally, fully or partially, financially responsible for it. John [name] expressed understanding and agreed to proceed.   Historic Elements   Mr. John [name] is a 63 y.o. year old, male patient evaluated today after his last encounter by our practice on 07/26/2019. John [name]  has a past medical history of Abnormal MRI, lumbar spine (2015) (01/21/2016), Anxiety, Back ache (05/06/2014), Cervical pain (05/06/2014), 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[place]) (06/23/2015), COPD (chronic obstructive pulmonary disease) ([place]), De Quervain's tenosynovitis, left (07/07/2017), Depression, Dystonia, Epileptic disorder ([place]) (06/23/2015), Extremity pain (05/06/2014), Febrile seizures ([place]), Foot pain (09/11/2014), Gastric ulcer (06/23/2015), Gout, H/O neoplasm (06/23/2015), Head injury (06/23/2015), Hemorrhoids, Hepatic cirrhosis ([place]) (06/23/2015), Hepatitis B, History of GI bleed, Leukopenia, Liver cancer  ([place]) (08/2018), Neurogenic pain (01/21/2016), Nodule of upper lobe of left lung (12/02/2017), Non-small cell cancer of left lung ([place]) (2019), Obstructive sleep apnea (  06/23/2015), Osteoarthritis of spine with radiculopathy, lumbar region (06/23/2015), Osteoarthritis of spine with radiculopathy, lumbosacral region (06/23/2015), Peripheral neuropathy (06/23/2015), Peripheral neuropathy (Al[place]) (67/67/2094), Personal history of tobacco use, presenting hazards to health (01/09/2016), Pulse irregularity, Seizures ([place]), Spinal stenosis, Splenomegaly (05/30/2013), Thrombocytopenia ([place]) (70/96/2836), and Uncomplicated opioid dependence ([place]) (06/23/2015). He also  has a past surgical history that includes surgery for cervical neck fracture; Esophagogastroduodenoscopy (11/2013); Tooth Extraction (Right, 09/28/2016); and Esophagogastroduodenoscopy (egd) with propofol (N/A, 06/23/2018). John [name] has a current medication list which includes the following prescription(s): alprazolam, avatrombopag maleate, capecitabine, cetirizine, folic acid, [START ON 62/94/7654] gabapentin, magnesium, omeprazole, ondansetron, [START ON 08/31/2019] oxycodone, [START ON 09/30/2019] oxycodone, [START ON 10/30/2019] oxycodone, prochlorperazine, urea, [DISCONTINUED] dulcolax, and [START ON 08/31/2019] baclofen. He  reports that he quit smoking about 22 months ago. His smoking use included cigarettes. He started smoking about 22 months ago. He has a 17.50 pack-year smoking history. His smokeless tobacco use includes snuff. He reports that he does not drink alcohol or use drugs. John [name] is allergic to codeine; morphine; and duloxetine.   HPI  Today, he is being contacted for medication management.  The patient indicates doing well with the current medication regimen. No adverse reactions or side effects reported to the medications.   Pharmacotherapy Assessment  Analgesic: Oxycodone 2.5 one every 12 hours (5 mg/day of oxycodone) (has enough  to last until 08/31/2019) MME/day:7.5 mg/day.   Monitoring: Pharmacotherapy: No side-effects or adverse reactions reported. [place] PMP: PDMP reviewed during this encounter.       Compliance: No problems identified. Effectiveness: Clinically acceptable. Plan: Refer to "POC".  UDS:  Summary  Date Value Ref Range Status  10/09/2018 FINAL  Final    Comment:    ==================================================================== TOXASSURE SELECT 13 (MW) ==================================================================== Test                             Result       Flag       Units Drug Present and Declared for Prescription Verification   Oxycodone                      754          EXPECTED   ng/mg creat   Oxymorphone                    232          EXPECTED   ng/mg creat   Noroxycodone                   986          EXPECTED   ng/mg creat    Sources of oxycodone include scheduled prescription medications.    Oxymorphone and noroxycodone are expected metabolites of    oxycodone. Oxymorphone is also available as a scheduled    prescription medication. Drug Absent but Declared for Prescription Verification   Alprazolam                     Not Detected UNEXPECTED ng/mg creat ==================================================================== Test                      Result    Flag   Units      Ref Range   Creatinine              37  mg/dL      >=20 ==================================================================== Declared Medications:  The flagging and interpretation on this report are based on the  following declared medications.  Unexpected results may arise from  inaccuracies in the declared medications.  **Note: The testing scope of this panel includes these medications:  Alprazolam (Xanax)  Oxycodone  **Note: The testing scope of this panel does not include following  reported medications:  Baclofen (Lioresal)  Cetirizine (Zyrtec)  Docusate (Dulcolax)  Folic  acid (Folvite)  Gabapentin  Magnesium Oxide  Omeprazole (Prilosec) ==================================================================== For clinical consultation, please call [phone]. ====================================================================    Laboratory Chemistry Profile (12 mo)  Renal: 08/08/2019: BUN 9; Creatinine 0.79  Lab Results  Component Value Date   GFRAA >60 08/08/2019   GFRNONAA >60 08/08/2019   Hepatic: 08/08/2019: Albumin 3.8 Lab Results  Component Value Date   AST 27 08/08/2019   ALT 12 08/08/2019   Other: No results found for requested labs within last 8760 hours. Note: Above Lab results reviewed.  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  CONTRAST:  32mL GADAVIST GADOBUTROL 1 MMOL/ML IV SOLN  COMPARISON:  03/28/2019  FINDINGS: Lower chest: Anterior juxta cardiac lymph node continues to progress. This was previously measured in long axis at 2.0 cm. Long axis measurement today is 2.9 cm.  Hepatobiliary: The large necrotic liver lesion involvement segment IV is again identified. This was previously measured on postcontrast imaging at 8.1 x 5.7 x 7.1 cm. Measuring on postcontrast imaging today, the lesion measures 9.7 x 7.0 cm x 7.9 cm.  Small rim enhancing lesions in the lateral segment left liver are more conspicuous today, measuring 1.2 and 1.5 cm (images 28 and 30 of 45 second postcontrast series 14). Small cysts again noted in the liver parenchyma.  Tiny gallstones evident. Gallbladder remains contracted with wall thickening.  Left portal vein again appears obliterated. Non opacification of the left and middle hepatic veins is again noted. There is an  anterior nonocclusive filling defect in the supra hepatic IVC extending up to the junction with the right atrium, similar to prior.  Pancreas: No focal mass lesion. No dilatation of the main duct. No intraparenchymal cyst. No peripancreatic edema.  Spleen: Heterogeneous enhancement on early postcontrast imaging. Spleen measures 14.9 cm craniocaudal length consistent with splenomegaly.  Adrenals/Urinary Tract: No adrenal nodule or mass. Right kidney unremarkable. Tiny interpolar cyst noted left kidney.  Stomach/Bowel: Stomach is unremarkable. No gastric wall thickening. No evidence of outlet obstruction. Duodenum is normally positioned as is the ligament of Treitz. No small bowel or colonic dilatation within the visualized abdomen.  Vascular/Lymphatic: No abdominal aortic aneurysm. Necrotic celiac axis node measures 2.9 cm short axis on 37/12 which compares to 2.7 cm previously. Necrotic lymph nodes are seen in the hepato duodenal ligament and a 2.2 cm short axis para duodenal node on 56/12 was 2.1 cm when I remeasure in a similar fashion on the prior study. 2.0 cm short axis left para-aortic node on 53/12 is similar. There is a lower left para-aortic node on 80/12 measuring 1.7 cm short axis, but incompletely visualized.  Other:  No intraperitoneal free fluid.  Musculoskeletal: No abnormal marrow enhancement within the visualized bony anatomy.  IMPRESSION: 1. Continued mild progression of the dominant left hepatic liver mass. 2. Metastatic  lymphadenopathy in the anterior mediastinum, celiac axis, hepato duodenal ligament, and para-aortic space is similar in the interval. 3. 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 4. Non opacification of the left portal vein suggests thrombosis. 5. Splenomegaly. 6. Cholelithiasis.  Electronically  Signed   By: Misty Stanley M.D.   On: 06/14/2019 10:41 CT Chest Wo Contrast CLINICAL DATA:  Putative stage IA left upper lobe lung cancer status post radiation therapy completed 02/01/2018. Additional history unresectable poorly differentiated liver adenocarcinoma. Restaging.  EXAM: CT CHEST WITHOUT CONTRAST  TECHNIQUE: Multidetector CT imaging of the chest was performed following the standard protocol without IV contrast.  COMPARISON:  11/28/2018 chest CT.  FINDINGS: Cardiovascular: Normal heart size. No significant pericardial effusion/thickening. Left anterior descending and right coronary atherosclerosis. Atherosclerotic nonaneurysmal thoracic aorta. Normal caliber pulmonary arteries.  Mediastinum/Nodes: No discrete thyroid nodules. Unremarkable esophagus. No axillary adenopathy. Enlarged right prevascular 1.8 cm node (series 2/image 43), increased from 1.4 cm on 11/28/2018 chest CT. Newly enlarged 1.6 cm left anterior pericardiophrenic node (series 2/image 113). Stable enlarged 2.2 cm right pericardiophrenic node (series 2/image 111). No discrete hilar adenopathy on this noncontrast scan.  Lungs/Pleura: No pneumothorax. No pleural effusion. Mild-to-moderate centrilobular emphysema with diffuse bronchial wall thickening. Stable 4 mm treated apical left upper lobe solid pulmonary nodule (series 3/image 20). Surrounding patchy sharply marginated consolidation, reticulation and ground-glass opacity in the apical left upper lobe, compatible with evolving postradiation change. Tiny 2 mm posterior right upper lobe pulmonary nodules are stable. No new significant pulmonary nodules.  Upper abdomen: Cholelithiasis. Diffusely irregular liver surface. Stable 1.1 cm right liver dome simple cyst. Poorly delineated lesion in the medial segment left liver lobe with surrounding hyperdense material (series 2/image 130), incompletely visualized on this chest CT. Partial visualization of  mild splenomegaly. Bulky 2.8 cm porta hepatis node (series 2/image 138), increased from 2.1 cm on 11/28/2018 CT.  Musculoskeletal: No aggressive appearing focal osseous lesions. Mild thoracic spondylosis.  IMPRESSION: 1. Progressive metastatic adenopathy in the chest involving right prevascular and anterior pericardiophrenic mediastinal nodes. Progressive metastatic adenopathy in the porta hepatis. 2. Stable treated apical left upper lobe pulmonary nodule with evolving surrounding postradiation changes. 3. Please see the separate concurrent MRI abdomen report from today for details regarding the left liver lobe mass.  Aortic Atherosclerosis (ICD10-I70.0) and Emphysema (ICD10-J43.9).  Electronically Signed   By: Ilona Sorrel M.D.   On: 06/14/2019 09:40   Assessment  The primary encounter diagnosis was Cancer associated pain. Diagnoses of Chronic pain syndrome, Chronic low back pain (Primary Area of Pain) (Bilateral) (L>R), Chronic lower extremity pain (Secondary area of Pain) (Bilateral) (L>R), Chronic neck pain (Third area of Pain) (Bilateral) (R>L), Chronic upper extremity pain (Fourth area of Pain) (Bilateral) (R>L), Musculoskeletal pain, Neurogenic pain, and Opioid-induced constipation (OIC) were also pertinent to this visit.  Plan of Care  Problem-specific:  No problem-specific Assessment & Plan notes found for this encounter.  I have discontinued John [name]. John [name]'s Dulcolax and Dulcolax. I am also having him start on oxyCODONE and oxyCODONE. Additionally, I am having him maintain his folic acid, cetirizine, prochlorperazine, ondansetron, Magnesium, omeprazole, Avatrombopag Maleate, urea, capecitabine, ALPRAZolam, oxyCODONE, baclofen, and gabapentin.  Pharmacotherapy (Medications Ordered): Meds ordered this encounter  Medications  . oxyCODONE (OXY IR/ROXICODONE) 5 MG immediate release tablet    Sig: Take 0.5 tablets (2.5 mg total) by mouth 2 (two) times daily. Must  last 30  days    Dispense:  30 tablet    Refill:  0    Chronic Pain: STOP Act (Not applicable) Fill 1 day early if closed on refill date. Do not fill until: 08/31/2019. To last until: 09/30/2019. Avoid benzodiazepines within 8 hours of opioids  . oxyCODONE (OXY IR/ROXICODONE) 5 MG immediate release tablet    Sig: Take 0.5 tablets (2.5 mg total) by mouth 2 (two) times daily. Must last 30 days    Dispense:  30 tablet    Refill:  0    Chronic Pain: STOP Act (Not applicable) Fill 1 day early if closed on refill date. Do not fill until: 09/30/2019. To last until: 10/30/2019. Avoid benzodiazepines within 8 hours of opioids  . oxyCODONE (OXY IR/ROXICODONE) 5 MG immediate release tablet    Sig: Take 0.5 tablets (2.5 mg total) by mouth 2 (two) times daily. Must last 30 days    Dispense:  30 tablet    Refill:  0    Chronic Pain: STOP Act (Not applicable) Fill 1 day early if closed on refill date. Do not fill until: 10/30/2019. To last until: 11/29/2019. Avoid benzodiazepines within 8 hours of opioids  . baclofen (LIORESAL) 10 MG tablet    Sig: Take 1 tablet (10 mg total) by mouth 3 (three) times daily.    Dispense:  270 tablet    Refill:  1    Fill one day early if pharmacy is closed on scheduled refill date. May substitute for generic if available.  . gabapentin (NEURONTIN) 600 MG tablet    Sig: Take 1 tablet (600 mg total) by mouth 3 (three) times daily.    Dispense:  270 tablet    Refill:  1    Fill one day early if pharmacy is closed on scheduled refill date. May substitute for generic if available.   Orders:  No orders of the defined types were placed in this encounter.  Follow-up plan:   Return in about 14 weeks (around 11/28/2019) for (VV), (MM).      Interventional therapies:  Considering: Diagnostic left L5-S1 LESI  Diagnostic bilateral L4-5 TFESI  Diagnostic bilateral lumbar facet block  Diagnostic right-sided CESI    Palliative PRN treatment(s): Palliative left De Quervain's tendon injection       Recent Visits Date Type Provider Dept  05/30/19 Office Visit Milinda Pointer, MD [facility]  Showing recent visits within past 90 days and meeting all other requirements   Today's Visits Date Type Provider Dept  08/22/19 Telemedicine Milinda Pointer, MD [facility]  Showing today's visits and meeting all other requirements   Future Appointments No visits were found meeting these conditions.  Showing future appointments within next 90 days and meeting all other requirements   I discussed the assessment and treatment plan with the patient. The patient was provided an opportunity to ask questions and all were answered. The patient agreed with the plan and demonstrated an understanding of the instructions.  Patient advised to call back or seek an in-person evaluation if the symptoms or condition worsens.  Total duration of non-face-to-face encounter: 14 minutes.  Note by: John Cola, MD Date: 08/22/2019; Time: 10:10 AM  Note: This dictation was prepared with Dragon dictation. Any transcriptional errors that may result from this process are unintentional.

## 2019-08-22 ENCOUNTER — Telehealth: Payer: Self-pay | Admitting: Pharmacy Technician

## 2019-08-22 ENCOUNTER — Ambulatory Visit: Payer: Medicare Other | Attending: Pain Medicine | Admitting: Pain Medicine

## 2019-08-22 ENCOUNTER — Other Ambulatory Visit: Payer: Self-pay

## 2019-08-22 DIAGNOSIS — K5903 Drug induced constipation: Secondary | ICD-10-CM

## 2019-08-22 DIAGNOSIS — M545 Low back pain: Secondary | ICD-10-CM | POA: Diagnosis not present

## 2019-08-22 DIAGNOSIS — M79604 Pain in right leg: Secondary | ICD-10-CM

## 2019-08-22 DIAGNOSIS — G893 Neoplasm related pain (acute) (chronic): Secondary | ICD-10-CM | POA: Diagnosis not present

## 2019-08-22 DIAGNOSIS — M542 Cervicalgia: Secondary | ICD-10-CM | POA: Diagnosis not present

## 2019-08-22 DIAGNOSIS — M5441 Lumbago with sciatica, right side: Secondary | ICD-10-CM

## 2019-08-22 DIAGNOSIS — G894 Chronic pain syndrome: Secondary | ICD-10-CM

## 2019-08-22 DIAGNOSIS — M792 Neuralgia and neuritis, unspecified: Secondary | ICD-10-CM

## 2019-08-22 DIAGNOSIS — M7918 Myalgia, other site: Secondary | ICD-10-CM

## 2019-08-22 DIAGNOSIS — T402X5S Adverse effect of other opioids, sequela: Secondary | ICD-10-CM

## 2019-08-22 DIAGNOSIS — G8929 Other chronic pain: Secondary | ICD-10-CM

## 2019-08-22 MED ORDER — BACLOFEN 10 MG PO TABS: 10.0000 mg | ORAL_TABLET | Freq: Three times a day (TID) | ORAL | 1 refills | Status: DC

## 2019-08-22 MED ORDER — GABAPENTIN 600 MG PO TABS: 600.0000 mg | ORAL_TABLET | Freq: Three times a day (TID) | ORAL | 1 refills | Status: DC

## 2019-08-22 MED ORDER — OXYCODONE HCL 5 MG PO TABS: 2.5000 mg | ORAL_TABLET | Freq: Two times a day (BID) | ORAL | 0 refills | Status: DC

## 2019-08-22 NOTE — Telephone Encounter (Signed)
Oral Oncology Patient Advocate Encounter  Called patient to complete application for Medicare Extra Help for Prescription drugs.  Patient had to complete this form and receive a denial letter to continue to receive Doptelet from [employer] at no cost.  I completed the form online with the patients consent and submitted it.  Medicare Extra Help will review the patients application and send him a letter if approved or denied.  If approved, the patient will have a low copay and be able to afford at a specialty pharmacy.  If denied, patient will need to send a copy of denial letter to [employer] to continue to receive assistance.  Patient understands and was grateful for all the help.  Oral oncology clinic will continue to follow up until a determination has been made.   [place] Patient [place] Phone [phone] Fax [phone] 08/22/2019 12:04 PM

## 2019-08-24 ENCOUNTER — Other Ambulatory Visit: Payer: Self-pay | Admitting: Hematology

## 2019-08-24 DIAGNOSIS — C221 Intrahepatic bile duct carcinoma: Secondary | ICD-10-CM

## 2019-09-03 ENCOUNTER — Other Ambulatory Visit: Payer: Self-pay

## 2019-09-03 DIAGNOSIS — C221 Intrahepatic bile duct carcinoma: Secondary | ICD-10-CM

## 2019-09-03 MED ORDER — CAPECITABINE 500 MG PO TABS: ORAL_TABLET | ORAL | 0 refills | Status: DC

## 2019-09-03 MED FILL — CAPECITABINE 500 MG TABS: 500 | 28 days supply | Qty: 70 | Fill #0

## 2019-09-06 ENCOUNTER — Ambulatory Visit: Payer: Medicare Other | Admitting: Hematology

## 2019-09-06 ENCOUNTER — Other Ambulatory Visit: Payer: Medicare Other

## 2019-09-10 ENCOUNTER — Ambulatory Visit
Admission: RE | Admit: 2019-09-10 | Discharge: 2019-09-10 | Disposition: A | Discharge status: Home / Self Care | Payer: Medicare Other | Source: Ambulatory Visit | Attending: Hematology | Admitting: Hematology

## 2019-09-10 ENCOUNTER — Other Ambulatory Visit: Payer: Self-pay

## 2019-09-10 ENCOUNTER — Ambulatory Visit: Admission: RE | Admit: 2019-09-10 | Payer: Medicare Other | Source: Ambulatory Visit

## 2019-09-10 DIAGNOSIS — C221 Intrahepatic bile duct carcinoma: Secondary | ICD-10-CM | POA: Insufficient documentation

## 2019-09-10 DIAGNOSIS — C220 Liver cell carcinoma: Secondary | ICD-10-CM | POA: Diagnosis not present

## 2019-09-10 MED ORDER — IOHEXOL 300 MG/ML  SOLN
100.0000 mL | Freq: Once | INTRAMUSCULAR | Status: AC | PRN
  Administered 2019-09-10: 12:00:00 100 mL via INTRAVENOUS

## 2019-09-10 NOTE — Progress Notes (Signed)
[place]   [phone] Fax:([phone]   Clinic Follow up Note   Patient Care Team: Cletis Athens, MD as PCP - General (Internal Medicine) Manya Silvas, MD (Inactive) (Gastroenterology) Milinda Pointer, MD as Referring Physician (Pain Medicine)  Date of Service:  09/12/2019  CHIEF COMPLAINT: F/u ofadenocarcinoma in liver  SUMMARY OF ONCOLOGIC HISTORY: Oncology History  Cholangiocarcinoma ([place])  05/05/2018 Initial Biopsy   DIAGNOSIS: 05/05/18 A. LIVER MASS; ULTRASOUND-GUIDED BIOPSY:  - CYTOKERATIN 7 POSITIVE ADENOCARCINOMA WITH ABUNDANT NECROSIS, SEE  COMMENT.  Diagnosis 06/28/18 Consult- Comprehensive, [phone] Liver Bx - POORLY DIFFERENTIATED CARCINOMA INVOLVING LIVER PARENCHYMA. SEE NOTE. Diagnosis Note Most of the tumor shows a abortive glad formation, consistent with an adenocarcinoma. However, in some areas, the tumor cells show increased cytoplasm with a nested architecture, giving them a hepatoid appearance. Immunohistochemical stains performed at the outside institution show that the tumor cells are positive for CK7 and negative for TTF-1, Napsin-A, CDX-2, Hepar-1 and CD34. Immunostains performed at [facility] show that glypican-3 has patchy positive staining in the hepatoid-appearing tumor cells. Arginase and AFP stains show very focal, weak staining. Immunostain of pCEA shows focal canalicular-like staining pattern in the tumor cells. The findings are consistent with a cholangiocarcinoma but the the tumor histomorphology and staining pattern are suggestive of a hepatocellular carcinoma component, raising possibility of a combined hepatocellular cholangiocarcinoma. Additional biopsies or re-evaluation on the resection specimen is suggested for confirmation.   06/02/2018 PET scan   PET 06/02/18  IMPRESSION: 1. Solitary hypermetabolic lesion in the central LEFT hepatic lobe consistent with malignancy. 2. No metabolic activity  remaining in LEFT apical pulmonary nodule which is reduced in size. 3. No evidence of metastatic adenopathy on skull base to thigh FDG PET scan. 4. Cirrhotic liver.  Cholelithiasis 5.  Aortic Atherosclerosis (ICD10-I70.0).   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 Jearld Shines at [facility]  Estimated dose to liver target: 117 Gy Estimated dose to lung: 5.56 Gy Impression: Successful radioembolization with administration of intravascular yttrium-90 Theraspheres as described above.   10/18/2018 Imaging   US Abdomen 10/18/18 IMPRESSION: 1. Cholelithiasis without evidence of acute cholecystitis. 2. 2.2 cm left hepatic lobe mass consistent with known malignancy. 3. Cirrhosis and splenomegaly.   11/09/2018 Imaging   MRI Abdomen 11/09/18 at [facility]  Impression: Marked increased size of the mass centered in segment 4A with new satellite lesions seen in segments 2 and 3, likely representing multifocal mass-forming cholangiocarcinoma. New tumor invasion seen within the right anterior portal vein and middle hepatic vein to the level of the IVC, which is a pattern more commonly seen with hepatocellular carcinoma.   11/25/2018 Miscellaneous   Foundation One 3/21.2020  MSI -Stable - cannot be determined  Tumor Mutation Burden - cannot be determined  TERT promoter 124C>T TP53 [id number]   11/28/2018 Imaging   CT Chest 11/28/18  IMPRESSION: 1. Further improvement in left apical nodule, now nearly resolved. Mild surrounding radiation changes. 2. No new or enlarging pulmonary nodules. 3. Treated lesion in the left hepatic lobe is not optimally visualized on this noncontrast study, although appears slightly larger. 4. Progressive adenopathy in the upper abdomen. 5. Additional incidental findings are stable, including cirrhosis, splenomegaly, aortic valvular calcifications, Aortic Atherosclerosis (ICD10-I70.0) and  Emphysema (ICD10-J43.9).   12/25/2018 Cancer Staging   Staging form: Intrahepatic Bile Duct, AJCC 8th Edition - Clinical stage from 12/25/2018: Stage II (cT2(m), cN0, cM0) - Signed by [name], MD  on 12/27/2018   01/24/2019 - 06/18/2019 Chemotherapy   First line Cisplatin and Gemcitabine every 2 weeks starting 01/24/19  -He did have reaction to IV Emend and dexa with SOB, chest pain and flushing, will d/c and will switch dexa to Solu-Medrol on cycle 2.   -Stopped on 06/18/19 due to disease prgression.    03/28/2019 Imaging   MRI IMPRESSION: 1. Slight interval increase in size of the large necrotic central hepatic neoplasm. 2. Stable celiac axis lymph node and slightly enlarged left para-aortic, right hepatoduodenal ligament and epicardial lymph nodes. 3. 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 4. Stable splenomegaly.   06/14/2019 Imaging    CT Chest WO Contrast 06/14/19 IMPRESSION: 1. Progressive metastatic adenopathy in the chest involving right prevascular and anterior pericardiophrenic mediastinal nodes. Progressive metastatic adenopathy in the porta hepatis. 2. Stable treated apical left upper lobe pulmonary nodule with evolving surrounding postradiation changes. 3. Please see the separate concurrent MRI abdomen report from today for details regarding the left liver lobe mass.   Aortic Atherosclerosis (ICD10-I70.0) and Emphysema (ICD10-J43.9).   06/14/2019 Imaging    MRI abdomen 06/14/19  IMPRESSION: 1. Continued mild progression of the dominant left hepatic liver mass. 2. Metastatic lymphadenopathy in the anterior mediastinum, celiac axis, hepato duodenal ligament, and para-aortic space is similar in the interval. 3. 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 4. Non opacification of the left  portal vein suggests thrombosis. 5. Splenomegaly. 6. Cholelithiasis.   06/25/2019 - 09/16/2019 Chemotherapy   Second-line chemo with oral Xeloda 2032m in the AM and 15018min the PM 2 weeks on/1 week off starting 06/25/19. Due to skin toxicity dose reduced to 150016mID 1 week on and 1 week off starting with C2 on 07/16/19. Due to hand foot syndrome with C3 will reduce Xeloda to 1500m1m AM and 1000mg61mthe PM starting C4 on 08/13/19. He reduced to 1000mg 51mfor the last week of C5. Will stop on 09/16/19 due to disease progression.    09/10/2019 Imaging   CT CAP W Contrast  IMPRESSION: 1. Bulky, multifocal rim enhancing hepatocellular carcinoma, internally necrotic appearing, is slightly increased in size compared to prior examination, a dominant mass in the anterior right lobe of the liver measuring 10.4 x 7.0 cm, previously 9.7 x 6.0 cm when measured similarly on prior MRI (series 2, image 58). 2. Interval enlargement in bulky portacaval and retroperitoneal lymph nodes, largest portacaval node measuring 4.3 x 3.5 cm, previously 2.4 x 2.1 cm (series 2, image 67). 3. Significant interval decrease in size in a previously seen bulky prevascular lymph node near the brachiocephalic confluence, now measuring 1.6 x 1.1 cm, previously 2.5 x 1.8 cm when measured similarly (series 2, image 19). 4. Above constellation of findings are consistent with worsened primary and and abdominal nodal metastatic disease but with improved thoracic adenopathy. 5. Stable appearance of left apical pulmonary nodule status post radiation therapy with adjacent radiation change. 6. Underlying cirrhotic morphology of the liver with splenomegaly and ascites. 7. Emphysema (ICD10-J43.9). 8. Aortic Atherosclerosis (ICD10-I70.0).      CURRENT THERAPY:  second line chemo with oral Xeloda 2000mg i67me AM and 1500mg in10m PM 2 weeks on/1 week off starting 06/25/19.Due to skin toxicity dose reduced to 1500mg BID100meek  on and 1 week off starting with C2 on  07/16/19.Due to hand foot syndrome with C3 will reduce Xeloda to 1561m in AM and 10029min the PM starting C4 on 08/13/19. He reduced to 100016mID for the last week of C5. Will stop on 09/16/19 due to disease progression.    INTERVAL HISTORY:  SteYARIEL FERRARIS here for a follow up. He presents to the clinic alone. He notes since contrast from scan he had lower abdominal pain and discomfort. He notes he went to another location for his scan because the other locations machine was down. This is manageable so far. He notes his last dose Xeloda should be 1/10. He notes he did reduce his Xeloda given his hand foot syndrome which impacted his activity level. He is currently 1000m6mD. He is still interested in treatment.    REVIEW OF SYSTEMS:   Constitutional: Denies fevers, chills or abnormal weight loss Eyes: Denies blurriness of vision Ears, nose, mouth, throat, and face: Denies mucositis or sore throat Respiratory: Denies cough, dyspnea or wheezes Cardiovascular: Denies palpitation, chest discomfort or lower extremity swelling Gastrointestinal:  Denies nausea, heartburn or change in bowel habits (+) Lower abdominal pain  Skin: Denies abnormal skin rashes (+) hand foot syndrome with palmer and sole redness Lymphatics: Denies new lymphadenopathy or easy bruising Neurological:Denies numbness, tingling or new weaknesses Behavioral/Psych: Mood is stable, no new changes  All other systems were reviewed with the patient and are negative.  MEDICAL HISTORY:  Past Medical History:  Diagnosis Date  . Abnormal MRI, lumbar spine (2015) 01/21/2016   FINDINGS:  L3-4: Tiny right foraminal and extra foraminal disc bulge adjacent to but not compressing the L3 nerve lateral to the neural foramen. L4-5: There is a small broad-based disc bulge. There is also hypertrophy of the ligamentum flavum and facet joints, left more than right creating moderately severe spinal  stenosis and bilateral lateral recess stenosis, left greater than right. This appe  . Anxiety   . Back ache 05/06/2014  . Cervical pain 05/06/2014  . 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HCC)[place]/17/2016  . COPD (chronic obstructive pulmonary disease) (HCC)[place]. De Quervain's tenosynovitis, left 07/07/2017  . Depression   . Dystonia   . Epileptic disorder (HCC)[place]/17/2016  . Extremity pain 05/06/2014  . Febrile seizures (HCC)[place] child  . Foot pain 09/11/2014  . Gastric ulcer 06/23/2015  . Gout   . H/O neoplasm 06/23/2015  . Head injury 06/23/2015  . Hemorrhoids   . Hepatic cirrhosis (HCC)[place]/17/2016  . Hepatitis B   . History of GI bleed   . Leukopenia   . Liver cancer (HCC)[place]/2019  . Neurogenic pain 01/21/2016  . Nodule of upper lobe of left lung 12/02/2017  . Non-small cell cancer of left lung (HCC)[place]19   Rad tx's.   . Obstructive sleep apnea 06/23/2015  . 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 Osteoarthritis of spine with radiculopathy, lumbosacral region 06/23/2015  . Peripheral neuropathy 06/23/2015  . Peripheral neuropathy (Alcoholic) 10/121/30/8657Personal history of tobacco use, presenting hazards to health 01/09/2016  . Pulse irregularity   . Seizures (HCC)[place]. Spinal stenosis   . Splenomegaly 05/30/2013  . Thrombocytopenia (HCC)[place]/17/2016  . Uncomplicated opioid dependence (HCC)[place]/17/2016    SURGICAL HISTORY: Past Surgical History:  Procedure Laterality Date  . ESOPHAGOGASTRODUODENOSCOPY  11/2013  . ESOPHAGOGASTRODUODENOSCOPY (EGD) WITH PROPOFOL N/A 06/23/2018   Procedure: ESOPHAGOGASTRODUODENOSCOPY (EGD) WITH PROPOFOL;  Surgeon: ElliManya Silvas;  [facility];  Service: Endoscopy;  Laterality: N/A;  . surgery for cervical neck fracture    . TOOTH EXTRACTION Right 09/28/2016    I have reviewed the social history and family history with the patient and they are unchanged from previous note.  ALLERGIES:  is allergic to  codeine; morphine; and duloxetine.  MEDICATIONS:  Current Outpatient Medications  Medication Sig Dispense Refill  . ALPRAZolam (XANAX) 0.25 MG tablet Take 1 tablet (0.25 mg total) by mouth at bedtime as needed for anxiety or sleep. 30 tablet 0  . Avatrombopag Maleate 20 MG TABS Take 40 mg by mouth 2 (two) times daily. 120 tablet 1  . baclofen (LIORESAL) 10 MG tablet Take 1 tablet (10 mg total) by mouth 3 (three) times daily. 270 tablet 1  . capecitabine (XELODA) 500 MG tablet TAKE 3 TABS EVERY AM AND 2 TABS EVERY PM, IMMEDIATELY AFTER FOOD, FOR 7 DAYS ON, 7 DAYS OFF, REPEAT EVERY 14 DAYS 70 tablet 0  . cetirizine (ZYRTEC) 10 MG tablet Take 10 mg by mouth daily.  3  . folic acid (FOLVITE) 1 MG tablet Take 1 mg by mouth daily.     Mar[place] Kitchen gabapentin (NEURONTIN) 600 MG tablet Take 1 tablet (600 mg total) by mouth 3 (three) times daily. 270 tablet 1  . Magnesium 500 MG CAPS Take 500 mg by mouth 2 (two) times a day.    . ondansetron (ZOFRAN) 8 MG tablet Take 1 tablet (8 mg total) by mouth every 8 (eight) hours as needed for nausea or vomiting. 20 tablet 0  . oxyCODONE (OXY IR/ROXICODONE) 5 MG immediate release tablet Take 0.5 tablets (2.5 mg total) by mouth 2 (two) times daily. Must last 30 days 30 tablet 0  . [START ON 09/30/2019] oxyCODONE (OXY IR/ROXICODONE) 5 MG immediate release tablet Take 0.5 tablets (2.5 mg total) by mouth 2 (two) times daily. Must last 30 days 30 tablet 0  . [START ON 10/30/2019] oxyCODONE (OXY IR/ROXICODONE) 5 MG immediate release tablet Take 0.5 tablets (2.5 mg total) by mouth 2 (two) times daily. Must last 30 days 30 tablet 0  . prochlorperazine (COMPAZINE) 10 MG tablet Take 1 tablet (10 mg total) by mouth every 6 (six) hours as needed for nausea or vomiting. 30 tablet 0  . urea (CARMOL) 10 % cream Apply topically as needed. 71 g 0  . omeprazole (PRILOSEC) 20 MG capsule Take 1 capsule (20 mg total) by mouth daily. 90 capsule 1   No current facility-administered medications for  this visit.    PHYSICAL EXAMINATION: ECOG PERFORMANCE STATUS: 1 - Symptomatic but completely ambulatory  Vitals:   09/12/19 0912  BP: (!) 125/45  Pulse: (!) 54  Resp: 17  Temp: 97.7 F (36.5 C)  SpO2: 99%   Filed Weights   09/12/19 0912  Weight: 203 lb 12.8 oz (92.4 kg)    Due to COVID19 we will limit examination to appearance. GENERAL:alert, no distress and comfortable SKIN: skin color normal, no rashes or significant lesions (+) [place] erythema and dry skin EYES: normal, Conjunctiva are pink and non-injected, sclera clear  NEURO: alert & oriented x 3 with fluent speech   LABORATORY DATA:  I have reviewed the data as listed CBC Latest Ref Rng & Units 09/12/2019 08/08/2019 07/10/2019  WBC 4.0 - 10.5 K/uL 3.2(L) 2.9(L) 3.2(L)  Hemoglobin 13.0 - 17.0 g/dL 10.0(L) 10.0(L) 8.8(L)  Hematocrit 39.0 - 52.0 % 31.6(L) 32.3(L) 28.8(L)  Platelets 150 - 400 K/uL 193 152 213     CMP Latest Ref  Rng & Units 09/12/2019 08/08/2019 07/10/2019  Glucose 70 - 99 mg/dL 99 96 100(H)  BUN 8 - 23 mg/dL 7(L) 9 7(L)  Creatinine 0.61 - 1.24 mg/dL 0.77 0.79 0.77  Sodium 135 - 145 mmol/L 139 138 138  Potassium 3.5 - 5.1 mmol/L 3.8 4.0 4.5  Chloride 98 - 111 mmol/L 103 102 103  CO2 22 - 32 mmol/L '27 26 28  ' Calcium 8.9 - 10.3 mg/dL 8.3(L) 9.0 8.7(L)  Total Protein 6.5 - 8.1 g/dL 8.0 8.0 7.5  Total Bilirubin 0.3 - 1.2 mg/dL 0.7 0.7 0.6  Alkaline Phos 38 - 126 U/L 93 84 78  AST 15 - 41 U/L '28 27 30  ' ALT 0 - 44 U/L '9 12 11      ' RADIOGRAPHIC STUDIES: I have personally reviewed the radiological images as listed and agreed with the findings in the report. CT Chest W Contrast  Result Date: 09/10/2019 CLINICAL DATA:  Follow-up HCC EXAM: CT CHEST, ABDOMEN, AND PELVIS WITH CONTRAST TECHNIQUE: Multidetector CT imaging of the chest, abdomen and pelvis was performed following the standard protocol during bolus administration of intravenous contrast. CONTRAST:  144m OMNIPAQUE IOHEXOL 300 MG/ML SOLN, additional  oral enteric contrast COMPARISON:  MR abdomen, 06/14/2019, 03/28/2019, CT chest, 06/14/2019, 11/28/2018 FINDINGS: CT CHEST FINDINGS Cardiovascular: Aortic valve calcifications. Aortic atherosclerosis. Normal heart size. No pericardial effusion. Mediastinum/Nodes: Significant interval decrease in size in a previously seen bulky prevascular lymph node near the brachiocephalic confluence, now measuring 1.6 x 1.1 cm, previously 2.5 x 1.8 cm when measured similarly (series 2, image 19). Thyroid gland, trachea, and esophagus demonstrate no significant findings. Lungs/Pleura: Mild centrilobular and paraseptal emphysema. Unchanged appearance of a left apical pulmonary nodule status post radiation therapy with adjacent radiation change (series 3, image 21). No pleural effusion or pneumothorax. Musculoskeletal: No chest wall mass or suspicious bone lesions identified. CT ABDOMEN PELVIS FINDINGS Hepatobiliary: Bulky, multifocal rim enhancing hepatocellular carcinoma, internally necrotic appearing, is slightly increased in size compared to prior examination, a dominant mass in the anterior right lobe of the liver measuring 10.4 x 7.0 cm, previously 9.7 x 6.0 cm when measured similarly on prior MRI (series 2, image 58). Underlying cirrhotic morphology of the liver. No gallstones, gallbladder wall thickening, or biliary dilatation. Pancreas: Unremarkable. No pancreatic ductal dilatation or surrounding inflammatory changes. Spleen: Redemonstrated splenomegaly, maximum coronal span 16.3 cm. Adrenals/Urinary Tract: Adrenal glands are unremarkable. Kidneys are normal, without renal calculi, solid lesion, or hydronephrosis. Bladder is unremarkable. Stomach/Bowel: Stomach is within normal limits. Appendix appears normal. No evidence of bowel wall thickening, distention, or inflammatory changes. Vascular/Lymphatic: Aortic atherosclerosis. Interval enlargement in bulky portacaval and retroperitoneal lymph nodes, largest portacaval node  measuring 4.3 x 3.5 cm, previously 2.4 x 2.1 cm (series 2, image 67). Reproductive: Mild prostatomegaly. Other: No abdominal wall hernia or abnormality. Redemonstrated small volume ascites. Musculoskeletal: No acute or significant osseous findings. IMPRESSION: 1. Bulky, multifocal rim enhancing hepatocellular carcinoma, internally necrotic appearing, is slightly increased in size compared to prior examination, a dominant mass in the anterior right lobe of the liver measuring 10.4 x 7.0 cm, previously 9.7 x 6.0 cm when measured similarly on prior MRI (series 2, image 58). 2. Interval enlargement in bulky portacaval and retroperitoneal lymph nodes, largest portacaval node measuring 4.3 x 3.5 cm, previously 2.4 x 2.1 cm (series 2, image 67). 3. Significant interval decrease in size in a previously seen bulky prevascular lymph node near the brachiocephalic confluence, now measuring 1.6 x 1.1 cm, previously 2.5 x 1.8 cm  when measured similarly (series 2, image 19). 4. Above constellation of findings are consistent with worsened primary and and abdominal nodal metastatic disease but with improved thoracic adenopathy. 5. Stable appearance of left apical pulmonary nodule status post radiation therapy with adjacent radiation change. 6. Underlying cirrhotic morphology of the liver with splenomegaly and ascites. 7. Emphysema (ICD10-J43.9). 8. Aortic Atherosclerosis (ICD10-I70.0). Electronically Signed   By: Eddie Candle M.D.   On: 09/10/2019 12:22   CT Abdomen Pelvis W Contrast  Result Date: 09/10/2019 CLINICAL DATA:  Follow-up [place] EXAM: CT CHEST, ABDOMEN, AND PELVIS WITH CONTRAST TECHNIQUE: Multidetector CT imaging of the chest, abdomen and pelvis was performed following the standard protocol during bolus administration of intravenous contrast. CONTRAST:  166m OMNIPAQUE IOHEXOL 300 MG/ML SOLN, additional oral enteric contrast COMPARISON:  MR abdomen, 06/14/2019, 03/28/2019, CT chest, 06/14/2019, 11/28/2018 FINDINGS: CT  CHEST FINDINGS Cardiovascular: Aortic valve calcifications. Aortic atherosclerosis. Normal heart size. No pericardial effusion. Mediastinum/Nodes: Significant interval decrease in size in a previously seen bulky prevascular lymph node near the brachiocephalic confluence, now measuring 1.6 x 1.1 cm, previously 2.5 x 1.8 cm when measured similarly (series 2, image 19). Thyroid gland, trachea, and esophagus demonstrate no significant findings. Lungs/Pleura: Mild centrilobular and paraseptal emphysema. Unchanged appearance of a left apical pulmonary nodule status post radiation therapy with adjacent radiation change (series 3, image 21). No pleural effusion or pneumothorax. Musculoskeletal: No chest wall mass or suspicious bone lesions identified. CT ABDOMEN PELVIS FINDINGS Hepatobiliary: Bulky, multifocal rim enhancing hepatocellular carcinoma, internally necrotic appearing, is slightly increased in size compared to prior examination, a dominant mass in the anterior right lobe of the liver measuring 10.4 x 7.0 cm, previously 9.7 x 6.0 cm when measured similarly on prior MRI (series 2, image 58). Underlying cirrhotic morphology of the liver. No gallstones, gallbladder wall thickening, or biliary dilatation. Pancreas: Unremarkable. No pancreatic ductal dilatation or surrounding inflammatory changes. Spleen: Redemonstrated splenomegaly, maximum coronal span 16.3 cm. Adrenals/Urinary Tract: Adrenal glands are unremarkable. Kidneys are normal, without renal calculi, solid lesion, or hydronephrosis. Bladder is unremarkable. Stomach/Bowel: Stomach is within normal limits. Appendix appears normal. No evidence of bowel wall thickening, distention, or inflammatory changes. Vascular/Lymphatic: Aortic atherosclerosis. Interval enlargement in bulky portacaval and retroperitoneal lymph nodes, largest portacaval node measuring 4.3 x 3.5 cm, previously 2.4 x 2.1 cm (series 2, image 67). Reproductive: Mild prostatomegaly. Other: No  abdominal wall hernia or abnormality. Redemonstrated small volume ascites. Musculoskeletal: No acute or significant osseous findings. IMPRESSION: 1. Bulky, multifocal rim enhancing hepatocellular carcinoma, internally necrotic appearing, is slightly increased in size compared to prior examination, a dominant mass in the anterior right lobe of the liver measuring 10.4 x 7.0 cm, previously 9.7 x 6.0 cm when measured similarly on prior MRI (series 2, image 58). 2. Interval enlargement in bulky portacaval and retroperitoneal lymph nodes, largest portacaval node measuring 4.3 x 3.5 cm, previously 2.4 x 2.1 cm (series 2, image 67). 3. Significant interval decrease in size in a previously seen bulky prevascular lymph node near the brachiocephalic confluence, now measuring 1.6 x 1.1 cm, previously 2.5 x 1.8 cm when measured similarly (series 2, image 19). 4. Above constellation of findings are consistent with worsened primary and and abdominal nodal metastatic disease but with improved thoracic adenopathy. 5. Stable appearance of left apical pulmonary nodule status post radiation therapy with adjacent radiation change. 6. Underlying cirrhotic morphology of the liver with splenomegaly and ascites. 7. Emphysema (ICD10-J43.9). 8. Aortic Atherosclerosis (ICD10-I70.0). Electronically Signed   By: ADorna BloomD.  On: 09/10/2019 12:22     ASSESSMENT & PLAN:  John [name] is a 64 y.o. 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 His liver biopsy showed poorly differentiated adenocarcinoma, CK7 positive, other markers were negative.No other primary tumor seen on scan or endoscopy. -He was seen by surgeon Dr. Zenia Resides at [facility] in 07/2018 who did notthink he is a candidate for surgical resection.He underwentY90 radio embolization on12/19/19with Dr. Jearld Shines at [facility].  -he has progressed through  first linechemocisplatin and gemcitabine -He startedsecond line chemo with oral Xeloda 2087m in the AM and 15038min the PM 2 weeks on/1 week off starting 06/25/19.starting with C2 on 07/16/19 Xeloda was reduced to 150024mID 1 week on and 1 week off. Due to progressing hand and foot syndrome we reduced Xeloda to 1500m48m the AM and 1000mg83mthe PM 1 week on/1 week off starting with C4 on 12/7.  -I personally reviewed and discussed his CT CAP from 09/10/19 which shows mild growth of liver tumor by 10%, enlarged retroperitoneal and porta caval LNs. There is significant decrease in Thoracic LN. Overall he has had disease progression -He will stop Xeloda after this current cycle on 1/10, or until he starts next line therapy. He feels Xeloda is helping his abdominal pain   -[facility] other [facility] options. He is interested.  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 I reviewed side effects such as fatigue, low blood counts and cold sensitivity.  If he proceeds, will recommend PAC be placed, he is interested.  -I discussed his cancer has not been very responsive to chemo and later lines may not be as effective. He understands. -I previously did foundation one testing, which showed TP53 and TERT mutations, not targetable  -Labs reviewed and stable.  -F/u open   2.Hepatitis B and liver cirrhosis -He doesn't see a hepatologist nor an infectious disease physician,he has never received hepatitis B treatment. -Ipreviouslyencouraged him to return to GI Dr. EllioTiffany Kochermanagement.  3. 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 -Given he is on chemotherapy, Ipreviouslyprescribed him Doptelet(Avatrombopag) 40mg 42m Will continue Doptelet with Xeloda. -Given normalized PLT and dose reduction of  Xeloda, we previously reduced Doptelet to 20mg o54mdaily (07/10/19).   4. Left apical lungcancer stage IA,s/p SBRT, COPD -PET scanon 11/24/2017 revealed a hypermetabolic 1.0 cm left apical pulmonary nodulefavoring malignancy. Assuming non-small cell lung cancer this represents T1a N0 M0 disease (stage IA).Biopsy was not obtained -Underwent SBRT 01/05/18-02/01/18 -CTchest from 06/14/19 showsprogression of metastatic adenopathy in the chest, stable treated left lung nodule.Will continue to monitor with scan.Stable on 09/10/19 CT scan.   5. Social Support, FinanciAcupuncturistves by himself.  -He has 2 brotherswho helps him -I discussed our resources to help with transportation if needed. -He notes increases in billing of his current treatment. I recommendedhe consult with his financial advocate Shauna Marguarite Arbourthis for more help. He is agreeable.  6. Chronic back pain, right flank and RUQ pain -He sees Dr. NaveiraDossie Arbourin management of chronic back pain.Continue to f/u with him.  -He is onOxycodone 2.5mg BID28me is also on muscle relaxant Baclofen 10mg. -H7mUQ pain is intermittent based on chemo treatment. Will monitor on next line treatment.   7.Insomnia -  Secondary to abdominal pain which is notwellcontrolled.  -He does take Oxycodone at night as needed.I suggest he take OTC melatonin to help. -Abdominal pain has much improved, but did have worsening pain from hand-foot syndrome.  -He continue Xanax as needed.   8.Goal of care discussion  -The patient understands the goal of care is palliative. -I recommend DNR/DNI, he will think about it  9. Nausea, Constipation, Hand-Foot Syndrome  -Secondary to Xeloda  -Will continue antiemetics and Doculax BID. He can increase if needed.  -Continue OTC topical hydrocortisone cream, Urea cream.  -Dose reduced multiple times, will d/c due to disease progression.    PLAN: -CT scan reviewed, showing disease  progression  -Stop Xeloda after current cycle on 1/10 -Refer to Dr. Joyce Gross at [facility]  -F/u open    No problem-specific Assessment & Plan notes found for this encounter.   No orders of the defined types were placed in this encounter.  All questions were answered. The patient knows to call the clinic with any problems, questions or concerns. No barriers to learning was detected. The total time spent in the appointment was 35 minutes.     [name], MD 09/12/2019   I, Joslyn Devon, am acting as scribe for [name], MD.   I have reviewed the above documentation for accuracy and completeness, and I agree with the above.

## 2019-09-12 ENCOUNTER — Telehealth: Payer: Self-pay | Admitting: *Deleted

## 2019-09-12 ENCOUNTER — Other Ambulatory Visit: Payer: Self-pay

## 2019-09-12 ENCOUNTER — Inpatient Hospital Stay: Payer: Medicare Other | Attending: Hematology

## 2019-09-12 ENCOUNTER — Inpatient Hospital Stay: Payer: Medicare Other | Admitting: Hematology

## 2019-09-12 VITALS — BP 125/45 | HR 54 | Temp 97.7°F | Resp 17 | Ht 73.0 in | Wt 203.8 lb

## 2019-09-12 DIAGNOSIS — B1910 Unspecified viral hepatitis B without hepatic coma: Secondary | ICD-10-CM | POA: Insufficient documentation

## 2019-09-12 DIAGNOSIS — D696 Thrombocytopenia, unspecified: Secondary | ICD-10-CM | POA: Diagnosis not present

## 2019-09-12 DIAGNOSIS — C221 Intrahepatic bile duct carcinoma: Secondary | ICD-10-CM | POA: Diagnosis not present

## 2019-09-12 DIAGNOSIS — R162 Hepatomegaly with splenomegaly, not elsewhere classified: Secondary | ICD-10-CM | POA: Diagnosis not present

## 2019-09-12 DIAGNOSIS — D6959 Other secondary thrombocytopenia: Secondary | ICD-10-CM | POA: Diagnosis not present

## 2019-09-12 DIAGNOSIS — R918 Other nonspecific abnormal finding of lung field: Secondary | ICD-10-CM | POA: Insufficient documentation

## 2019-09-12 DIAGNOSIS — M549 Dorsalgia, unspecified: Secondary | ICD-10-CM | POA: Insufficient documentation

## 2019-09-12 DIAGNOSIS — K7030 Alcoholic cirrhosis of liver without ascites: Secondary | ICD-10-CM

## 2019-09-12 DIAGNOSIS — K8020 Calculus of gallbladder without cholecystitis without obstruction: Secondary | ICD-10-CM | POA: Diagnosis not present

## 2019-09-12 DIAGNOSIS — C771 Secondary and unspecified malignant neoplasm of intrathoracic lymph nodes: Secondary | ICD-10-CM | POA: Insufficient documentation

## 2019-09-12 DIAGNOSIS — K7460 Unspecified cirrhosis of liver: Secondary | ICD-10-CM | POA: Diagnosis not present

## 2019-09-12 DIAGNOSIS — C220 Liver cell carcinoma: Secondary | ICD-10-CM | POA: Diagnosis not present

## 2019-09-12 DIAGNOSIS — Z79899 Other long term (current) drug therapy: Secondary | ICD-10-CM | POA: Diagnosis not present

## 2019-09-12 DIAGNOSIS — I700 Atherosclerosis of aorta: Secondary | ICD-10-CM | POA: Insufficient documentation

## 2019-09-12 DIAGNOSIS — G8929 Other chronic pain: Secondary | ICD-10-CM | POA: Diagnosis not present

## 2019-09-12 DIAGNOSIS — C229 Malignant neoplasm of liver, not specified as primary or secondary: Secondary | ICD-10-CM

## 2019-09-12 LAB — CMP (CANCER CENTER ONLY)
ALT: 9 U/L (ref 0–44)
AST: 28 U/L (ref 15–41)
Albumin: 3.5 g/dL (ref 3.5–5.0)
Alkaline Phosphatase: 93 U/L (ref 38–126)
Anion gap: 9 (ref 5–15)
BUN: 7 mg/dL — ABNORMAL LOW (ref 8–23)
CO2: 27 mmol/L (ref 22–32)
Calcium: 8.3 mg/dL — ABNORMAL LOW (ref 8.9–10.3)
Chloride: 103 mmol/L (ref 98–111)
Creatinine: 0.77 mg/dL (ref 0.61–1.24)
GFR, Est AFR Am: >60 mL/min (ref >60)
GFR, Estimated: >60 mL/min (ref >60)
Glucose, Bld: 99 mg/dL (ref 70–99)
Potassium: 3.8 mmol/L (ref 3.5–5.1)
Sodium: 139 mmol/L (ref 135–145)
Total Bilirubin: 0.7 mg/dL (ref 0.3–1.2)
Total Protein: 8 g/dL (ref 6.5–8.1)

## 2019-09-12 LAB — CBC WITH DIFFERENTIAL (CANCER CENTER ONLY)
Abs Immature Granulocytes: 0 10*3/uL (ref 0.00–0.07)
Basophils Absolute: 0 10*3/uL (ref 0.0–0.1)
Basophils Relative: 0 %
Eosinophils Absolute: 0.1 10*3/uL (ref 0.0–0.5)
Eosinophils Relative: 4 %
HCT: 31.6 % — ABNORMAL LOW (ref 39.0–52.0)
Hemoglobin: 10 g/dL — ABNORMAL LOW (ref 13.0–17.0)
Immature Granulocytes: 0 %
Lymphocytes Relative: 9 %
Lymphs Abs: 0.3 10*3/uL — ABNORMAL LOW (ref 0.7–4.0)
MCH: 32.3 pg (ref 26.0–34.0)
MCHC: 31.6 g/dL (ref 30.0–36.0)
MCV: 101.9 fL — ABNORMAL HIGH (ref 80.0–100.0)
Monocytes Absolute: 0.4 10*3/uL (ref 0.1–1.0)
Monocytes Relative: 11 %
Neutro Abs: 2.4 10*3/uL (ref 1.7–7.7)
Neutrophils Relative %: 76 %
Platelet Count: 193 10*3/uL (ref 150–400)
RBC: 3.1 MIL/uL — ABNORMAL LOW (ref 4.22–5.81)
RDW: 20 % — ABNORMAL HIGH (ref 11.5–15.5)
WBC Count: 3.2 10*3/uL — ABNORMAL LOW (ref 4.0–10.5)
nRBC: 0 % (ref 0.0–0.2)

## 2019-09-12 NOTE — Progress Notes (Signed)
Spoke with Tedra Coupe in [place] regarding pushing patient's recent scans out to power share for [facility] to be able to see.   Also spoke with HIM regarding forwarding notes to [facility].

## 2019-09-12 NOTE — Telephone Encounter (Signed)
Referral faxed to [facility] for pt to see Dr. Jonne Ply - release [phone]

## 2019-09-13 ENCOUNTER — Encounter: Payer: Self-pay | Admitting: Hematology

## 2019-09-13 ENCOUNTER — Telehealth: Payer: Self-pay | Admitting: Hematology

## 2019-09-13 LAB — CANCER ANTIGEN 19-9: CA 19-9: 46 U/mL — ABNORMAL HIGH (ref 0–35)

## 2019-09-13 NOTE — Telephone Encounter (Signed)
No los per 1/6.

## 2019-09-21 DIAGNOSIS — K7460 Unspecified cirrhosis of liver: Secondary | ICD-10-CM | POA: Diagnosis not present

## 2019-09-21 DIAGNOSIS — D696 Thrombocytopenia, unspecified: Secondary | ICD-10-CM | POA: Diagnosis not present

## 2019-09-21 DIAGNOSIS — C220 Liver cell carcinoma: Secondary | ICD-10-CM | POA: Insufficient documentation

## 2019-09-21 DIAGNOSIS — C221 Intrahepatic bile duct carcinoma: Secondary | ICD-10-CM | POA: Diagnosis not present

## 2019-09-25 ENCOUNTER — Telehealth: Payer: Self-pay | Admitting: Hematology

## 2019-09-25 NOTE — Telephone Encounter (Signed)
Scheduled appt per 1/19 sch message pt aware of appt date and time

## 2019-09-26 ENCOUNTER — Encounter: Payer: Self-pay | Admitting: Hematology

## 2019-09-26 ENCOUNTER — Inpatient Hospital Stay (HOSPITAL_BASED_OUTPATIENT_CLINIC_OR_DEPARTMENT_OTHER): Payer: Medicare Other | Admitting: Hematology

## 2019-09-26 DIAGNOSIS — C220 Liver cell carcinoma: Secondary | ICD-10-CM | POA: Diagnosis not present

## 2019-09-26 DIAGNOSIS — C221 Intrahepatic bile duct carcinoma: Secondary | ICD-10-CM

## 2019-09-26 DIAGNOSIS — K7030 Alcoholic cirrhosis of liver without ascites: Secondary | ICD-10-CM | POA: Diagnosis not present

## 2019-09-26 NOTE — Progress Notes (Signed)
DISCONTINUE OFF PATHWAY REGIMEN - Other Dx   [id number]:Cisplatin 25 mg/m2 D1,8 + Gemcitabine 1,000 mg/m2 D1,8 q21 Days:   A cycle is every 21 days:     Gemcitabine      Cisplatin   **Always confirm dose/schedule in your pharmacy ordering system**  REASON: Disease Progression PRIOR TREATMENT: Cisplatin 25 mg/m2 D1,8 + Gemcitabine 1,000 mg/m2 D1,8 q21 Days TREATMENT RESPONSE: Partial Response (PR)  START OFF PATHWAY REGIMEN - Other   [id number]:Atezolizumab 1,200 mg IV D1 + Bevacizumab 15 mg/kg IV D1 q21 Days:   A cycle is every 21 Days:     Atezolizumab      Bevacizumab-xxxx   **Always confirm dose/schedule in your pharmacy ordering system**  Patient Characteristics: Intent of Therapy: Non-Curative / Palliative Intent, Discussed with Patient

## 2019-09-26 NOTE — Progress Notes (Signed)
[place]   Telephone:([phone]) [phone] Fax:(336) [phone]   Clinic Follow up Note   Patient Care Team: Cletis Athens, MD as PCP - General (Internal Medicine) Manya Silvas, MD (Inactive) (Gastroenterology) Milinda Pointer, MD as Referring Physician (Pain Medicine)   I connected with Nonie Hoyer on 09/26/2019 at 11:20 AM EST by telephone visit and verified that I am speaking with the correct person using two identifiers.  I discussed the limitations, risks, security and privacy concerns of performing an evaluation and management service by telephone and the availability of in person appointments. I also discussed with the patient that there may be a patient responsible charge related to this service. The patient expressed understanding and agreed to proceed.   Patient's location:  Home  Provider's location:  My Office   CHIEF COMPLAINT: F/u ofadenocarcinoma in liver  SUMMARY OF ONCOLOGIC HISTORY: Oncology History  Cholangiocarcinoma ([place])  05/05/2018 Initial Biopsy   DIAGNOSIS: 05/05/18 A. LIVER MASS; ULTRASOUND-GUIDED BIOPSY:  - CYTOKERATIN 7 POSITIVE ADENOCARCINOMA WITH ABUNDANT NECROSIS, SEE  COMMENT.  Diagnosis 06/28/18 Consult- Comprehensive, [phone] Liver Bx - POORLY DIFFERENTIATED CARCINOMA INVOLVING LIVER PARENCHYMA. SEE NOTE. Diagnosis Note Most of the tumor shows a abortive glad formation, consistent with an adenocarcinoma. However, in some areas, the tumor cells show increased cytoplasm with a nested architecture, giving them a hepatoid appearance. Immunohistochemical stains performed at the outside institution show that the tumor cells are positive for CK7 and negative for TTF-1, Napsin-A, CDX-2, Hepar-1 and CD34. Immunostains performed at [facility] show that glypican-3 has patchy positive staining in the hepatoid-appearing tumor cells. Arginase and AFP stains show very focal, weak staining. Immunostain of pCEA shows focal canalicular-like  staining pattern in the tumor cells. The findings are consistent with a cholangiocarcinoma but the the tumor histomorphology and staining pattern are suggestive of a hepatocellular carcinoma component, raising possibility of a combined hepatocellular cholangiocarcinoma. Additional biopsies or re-evaluation on the resection specimen is suggested for confirmation.   06/02/2018 PET scan   PET 06/02/18  IMPRESSION: 1. Solitary hypermetabolic lesion in the central LEFT hepatic lobe consistent with malignancy. 2. No metabolic activity remaining in LEFT apical pulmonary nodule which is reduced in size. 3. No evidence of metastatic adenopathy on skull base to thigh FDG PET scan. 4. Cirrhotic liver.  Cholelithiasis 5.  Aortic Atherosclerosis (ICD10-I70.0).   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 Jearld Shines at [facility]  Estimated dose to liver target: 117 Gy Estimated dose to lung: 5.56 Gy Impression: Successful radioembolization with administration of intravascular yttrium-90 Theraspheres as described above.   10/18/2018 Imaging   US Abdomen 10/18/18 IMPRESSION: 1. Cholelithiasis without evidence of acute cholecystitis. 2. 2.2 cm left hepatic lobe mass consistent with known malignancy. 3. Cirrhosis and splenomegaly.   11/09/2018 Imaging   MRI Abdomen 11/09/18 at [facility]  Impression: Marked increased size of the mass centered in segment 4A with new satellite lesions seen in segments 2 and 3, likely representing multifocal mass-forming cholangiocarcinoma. New tumor invasion seen within the right anterior portal vein and middle hepatic vein to the level of the IVC, which is a pattern more commonly seen with hepatocellular carcinoma.   11/25/2018 Miscellaneous   Foundation One 3/21.2020  MSI -Stable - cannot be determined  Tumor Mutation Burden - cannot be determined  TERT promoter 124C>T TP53 [id number]     11/28/2018 Imaging   CT Chest 11/28/18  IMPRESSION: 1. Further improvement in left apical nodule,  now nearly resolved. Mild surrounding radiation changes. 2. No new or enlarging pulmonary nodules. 3. Treated lesion in the left hepatic lobe is not optimally visualized on this noncontrast study, although appears slightly larger. 4. Progressive adenopathy in the upper abdomen. 5. Additional incidental findings are stable, including cirrhosis, splenomegaly, aortic valvular calcifications, Aortic Atherosclerosis (ICD10-I70.0) and Emphysema (ICD10-J43.9).   12/25/2018 Cancer Staging   Staging form: Intrahepatic Bile Duct, AJCC 8th Edition - Clinical stage from 12/25/2018: Stage II (cT2(m), cN0, cM0) - Signed by [name], MD on 12/27/2018   01/24/2019 - 06/18/2019 Chemotherapy   First line Cisplatin and Gemcitabine every 2 weeks starting 01/24/19  -He did have reaction to IV Emend and dexa with SOB, chest pain and flushing, will d/c and will switch dexa to Solu-Medrol on cycle 2.   -Stopped on 06/18/19 due to disease prgression.    03/28/2019 Imaging   MRI IMPRESSION: 1. Slight interval increase in size of the large necrotic central hepatic neoplasm. 2. Stable celiac axis lymph node and slightly enlarged left para-aortic, right hepatoduodenal ligament and epicardial lymph nodes. 3. 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 4. Stable splenomegaly.   06/14/2019 Imaging    CT Chest WO Contrast 06/14/19 IMPRESSION: 1. Progressive metastatic adenopathy in the chest involving right prevascular and anterior pericardiophrenic mediastinal nodes. Progressive metastatic adenopathy in the porta hepatis. 2. Stable treated apical left upper lobe pulmonary nodule with evolving surrounding postradiation changes. 3. Please see the separate concurrent MRI abdomen report from today for details regarding the left liver lobe mass.   Aortic Atherosclerosis  (ICD10-I70.0) and Emphysema (ICD10-J43.9).   06/14/2019 Imaging    MRI abdomen 06/14/19  IMPRESSION: 1. Continued mild progression of the dominant left hepatic liver mass. 2. Metastatic lymphadenopathy in the anterior mediastinum, celiac axis, hepato duodenal ligament, and para-aortic space is similar in the interval. 3. 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 4. Non opacification of the left portal vein suggests thrombosis. 5. Splenomegaly. 6. Cholelithiasis.   06/25/2019 - 09/21/2019 Chemotherapy   Second-line chemo with oral Xeloda '2000mg'$  in the AM and '1500mg'$  in the PM 2 weeks on/1 week off starting 06/25/19. Due to skin toxicity dose reduced to '1500mg'$  BID 1 week on and 1 week off starting with C2 on 07/16/19. Due to hand foot syndrome with C3 will reduce Xeloda to '1500mg'$  in AM and '1000mg'$  in the PM starting C4 on 08/13/19. He reduced to '1000mg'$  BID for the last week of C5. Will stop on 09/21/19 due to disease progression.    09/10/2019 Imaging   CT CAP W Contrast  IMPRESSION: 1. Bulky, multifocal rim enhancing hepatocellular carcinoma, internally necrotic appearing, is slightly increased in size compared to prior examination, a dominant mass in the anterior right lobe of the liver measuring 10.4 x 7.0 cm, previously 9.7 x 6.0 cm when measured similarly on prior MRI (series 2, image 58). 2. Interval enlargement in bulky portacaval and retroperitoneal lymph nodes, largest portacaval node measuring 4.3 x 3.5 cm, previously 2.4 x 2.1 cm (series 2, image 67). 3. Significant interval decrease in size in a previously seen bulky prevascular lymph node near the brachiocephalic confluence, now measuring 1.6 x 1.1 cm, previously 2.5 x 1.8 cm when measured similarly (series 2, image 19). 4. Above constellation of findings are consistent with worsened primary  and and abdominal nodal  metastatic disease but with improved thoracic adenopathy. 5. Stable appearance of left apical pulmonary nodule status post radiation therapy with adjacent radiation change. 6. Underlying cirrhotic morphology of the liver with splenomegaly and ascites. 7. Emphysema (ICD10-J43.9). 8. Aortic Atherosclerosis (ICD10-I70.0).   10/05/2019 -  Chemotherapy   The patient had atezolizumab (TECENTRIQ) 1,200 mg in sodium chloride 0.9 % 250 mL chemo infusion, 1,200 mg, Intravenous, Once, 0 of 6 cycles bevacizumab-bvzr (ZIRABEV) 1,400 mg in sodium chloride 0.9 % 100 mL chemo infusion, 15 mg/kg, Intravenous,  Once, 0 of 6 cycles  for chemotherapy treatment.       CURRENT THERAPY:  PENDING Tecentriq and avastin q3weeks   INTERVAL HISTORY:  BRYANT SAYE is here for a follow up.  He notes he is doing well. He notes he stopped Xeloda 1/15. He has remaining unopened bottles which he can bring in with next visit. He saw Dr Joyce Gross last week. He notes he is interested in treatment while Dr. Joyce Gross looks for clinical trails.    REVIEW OF SYSTEMS:   Constitutional: Denies fevers, chills or abnormal weight loss Eyes: Denies blurriness of vision Ears, nose, mouth, throat, and face: Denies mucositis or sore throat Respiratory: Denies cough, dyspnea or wheezes Cardiovascular: Denies palpitation, chest discomfort or lower extremity swelling Gastrointestinal:  Denies nausea, heartburn or change in bowel habits Skin: Denies abnormal skin rashes Lymphatics: Denies new lymphadenopathy or easy bruising Neurological:Denies numbness, tingling or new weaknesses Behavioral/Psych: Mood is stable, no new changes  All other systems were reviewed with the patient and are negative.  MEDICAL HISTORY:  Past Medical History:  Diagnosis Date  . Abnormal MRI, lumbar spine (2015) 01/21/2016   FINDINGS:  L3-4: Tiny right foraminal and extra foraminal disc bulge adjacent to but not compressing the L3 nerve  lateral to the neural foramen. L4-5: There is a small broad-based disc bulge. There is also hypertrophy of the ligamentum flavum and facet joints, left more than right creating moderately severe spinal stenosis and bilateral lateral recess stenosis, left greater than right. This appe  . Anxiety   . Back ache 05/06/2014  . Cervical pain 05/06/2014  . 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[place]) 06/23/2015  . COPD (chronic obstructive pulmonary disease) ([place])   . De Quervain's tenosynovitis, left 07/07/2017  . Depression   . Dystonia   . Epileptic disorder ([place]) 06/23/2015  . Extremity pain 05/06/2014  . Febrile seizures ([place])    child  . Foot pain 09/11/2014  . Gastric ulcer 06/23/2015  . Gout   . H/O neoplasm 06/23/2015  . Head injury 06/23/2015  . Hemorrhoids   . Hepatic cirrhosis ([place]) 06/23/2015  . Hepatitis B   . History of GI bleed   . Leukopenia   . Liver cancer ([place]) 08/2018  . Neurogenic pain 01/21/2016  . Nodule of upper lobe of left lung 12/02/2017  . Non-small cell cancer of left lung ([place]) 2019   Rad tx's.   . Obstructive sleep apnea 06/23/2015  . 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 Osteoarthritis of spine with radiculopathy, lumbosacral region 06/23/2015  . Peripheral neuropathy 06/23/2015  . Peripheral neuropathy ([place]) 38/46/6599  . Personal history of tobacco use, presenting hazards to health 01/09/2016  . Pulse irregularity   . Seizures ([place])   . Spinal stenosis   . Splenomegaly 05/30/2013  . Thrombocytopenia ([place]) 06/23/2015  . Uncomplicated opioid dependence ([place]) 06/23/2015    SURGICAL HISTORY: Past Surgical History:  Procedure Laterality Date  . ESOPHAGOGASTRODUODENOSCOPY  11/2013  .  ESOPHAGOGASTRODUODENOSCOPY (EGD) WITH PROPOFOL N/A 06/23/2018   Procedure: ESOPHAGOGASTRODUODENOSCOPY (EGD) WITH PROPOFOL;  Surgeon: Manya Silvas, MD;  Location: [facility] ENDOSCOPY;  Service: Endoscopy;  Laterality: N/A;  . surgery for cervical  neck fracture    . TOOTH EXTRACTION Right 09/28/2016    I have reviewed the social history and family history with the patient and they are unchanged from previous note.  ALLERGIES:  is allergic to codeine; morphine; and duloxetine.  MEDICATIONS:  Current Outpatient Medications  Medication Sig Dispense Refill  . ALPRAZolam (XANAX) 0.25 MG tablet Take 1 tablet (0.25 mg total) by mouth at bedtime as needed for anxiety or sleep. 30 tablet 0  . Avatrombopag Maleate 20 MG TABS Take 40 mg by mouth 2 (two) times daily. 120 tablet 1  . baclofen (LIORESAL) 10 MG tablet Take 1 tablet (10 mg total) by mouth 3 (three) times daily. 270 tablet 1  . cetirizine (ZYRTEC) 10 MG tablet Take 10 mg by mouth daily.  3  . folic acid (FOLVITE) 1 MG tablet Take 1 mg by mouth daily.     Marland Kitchen gabapentin (NEURONTIN) 600 MG tablet Take 1 tablet (600 mg total) by mouth 3 (three) times daily. 270 tablet 1  . Magnesium 500 MG CAPS Take 500 mg by mouth 2 (two) times a day.    Marland Kitchen omeprazole (PRILOSEC) 20 MG capsule Take 1 capsule (20 mg total) by mouth daily. 90 capsule 1  . ondansetron (ZOFRAN) 8 MG tablet Take 1 tablet (8 mg total) by mouth every 8 (eight) hours as needed for nausea or vomiting. 20 tablet 0  . oxyCODONE (OXY IR/ROXICODONE) 5 MG immediate release tablet Take 0.5 tablets (2.5 mg total) by mouth 2 (two) times daily. Must last 30 days 30 tablet 0  . [START ON 09/30/2019] oxyCODONE (OXY IR/ROXICODONE) 5 MG immediate release tablet Take 0.5 tablets (2.5 mg total) by mouth 2 (two) times daily. Must last 30 days 30 tablet 0  . [START ON 10/30/2019] oxyCODONE (OXY IR/ROXICODONE) 5 MG immediate release tablet Take 0.5 tablets (2.5 mg total) by mouth 2 (two) times daily. Must last 30 days 30 tablet 0  . prochlorperazine (COMPAZINE) 10 MG tablet Take 1 tablet (10 mg total) by mouth every 6 (six) hours as needed for nausea or vomiting. 30 tablet 0  . urea (CARMOL) 10 % cream Apply topically as needed. 71 g 0   No current  facility-administered medications for this visit.    PHYSICAL EXAMINATION: ECOG PERFORMANCE STATUS: 1 - Symptomatic but completely ambulatory  No vitals taken today, Exam not performed today   LABORATORY DATA:  I have reviewed the data as listed CBC Latest Ref Rng & Units 09/12/2019 08/08/2019 07/10/2019  WBC 4.0 - 10.5 K/uL 3.2(L) 2.9(L) 3.2(L)  Hemoglobin 13.0 - 17.0 g/dL 10.0(L) 10.0(L) 8.8(L)  Hematocrit 39.0 - 52.0 % 31.6(L) 32.3(L) 28.8(L)  Platelets 150 - 400 K/uL 193 152 213     CMP Latest Ref Rng & Units 09/12/2019 08/08/2019 07/10/2019  Glucose 70 - 99 mg/dL 99 96 100(H)  BUN 8 - 23 mg/dL 7(L) 9 7(L)  Creatinine 0.61 - 1.24 mg/dL 0.77 0.79 0.77  Sodium 135 - 145 mmol/L 139 138 138  Potassium 3.5 - 5.1 mmol/L 3.8 4.0 4.5  Chloride 98 - 111 mmol/L 103 102 103  CO2 22 - 32 mmol/L _0 Calcium 8.9 - 10.3 mg/dL 8.3(L) 9.0 8.7(L)  Total Protein 6.5 - 8.1 g/dL 8.0 8.0 7.5  Total Bilirubin 0.3 - 1.2  mg/dL 0.7 0.7 0.6  Alkaline Phos 38 - 126 U/L 93 84 78  AST 15 - 41 U/L _0 ALT 0 - 44 U/L _1 RADIOGRAPHIC STUDIES: I have personally reviewed the radiological images as listed and agreed with the findings in the report. No results found.   ASSESSMENT & PLAN:  John [name] is a 63 y.o. male with    1.Poorly differentiated adenocarcinoma in [place] with [place] component, unresectable, now with distant node metastasis -He was diagnosed in 04/2018. His liver biopsy showed poorly differentiated adenocarcinoma, CK7 positive, other markers were negative.No other primary tumor seen on scan or endoscopy. -He was seen by surgeon [facility]. Zenia Resides [facility] in 07/2018 who did notthink he is a candidate for surgical resection.He underwentY90 radio embolization on12/19/19with Dr. Jearld Shines at [facility].  -he has progressed throughfirst linechemocisplatin and gemcitabine -Hestartedsecond line chemo with oral Xeloda 2039m in the AM and 15025min  the PM 2 weeks on/1 week off starting 06/25/19. Dose was adjusted multiple times due to hand foot syndrome.  -Unfortunately he had cancer progression based on 09/10/19 CT CAP. Xeloda was stopped 09/21/19. -[facility]. She feels he may have component of HCPocolaisease and recommends HCRock Hillegimen treatment such as Tecentriq and Avastin.  [facility], and get an abdominal MRI for further evaluation.  -While Dr. JaJoyce Grosss still looking for eligible clinical trails for him, we recommend he start immunotherapy Tecentriq with biological agent Avastin q3weeks in the meantime, if I am able to get insurance to approve it.  I have reviewed the data [facility], which is probably the most effective regiment at this point.  Potential side effect from Tecentriq and Avastin, especially pneumonitis, colitis, endocrine disorders, hypertension, proteinuria, thrombosis and bleeding, bowel perforation, etc., were discussed with him in details.  He agrees to proceed. -The goal of therapy is palliative, for disease control and prolong his life. -His last EGD was 2 years ago, no significant varices since that time.  He may need a repeated EGD dyspnea. -I discussed it is reasonable to have PAC placed when he is ready, he will think about it, but is leaning towards it.  -f/u in 1-2 weeks    2.Hepatitis B and liver cirrhosis -He doesn't see a hepatologist nor an infectious disease physician,he has never received hepatitis B treatment. -Ipreviouslyencouraged him to return to GI Dr. ElTiffany Kocheror management.  3. 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 -Given he is on chemotherapy, Ipreviouslyprescribed him Doptelet(Avatrombopag) 4083mID. Will continue Doptelet with Xeloda. -Given normalized PLT and dose reduction of Xeloda,  wepreviouslyreducedDoptelet to 14m33mce daily (07/10/19). May stop Doptelet soon.   4. Left apical lungcancer stage IA,s/p SBRT, COPD -PET scanon 11/24/2017 revealed a hypermetabolic 1.0 cm left apical pulmonary nodulefavoring malignancy. Assuming non-small cell lung cancer this represents T1a N0 M0 disease (stage IA).Biopsy was not obtained -Underwent SBRT 01/05/18-02/01/18 -CTchest from 06/14/19 showsprogression of metastatic adenopathy in the chest, stable treated left lung nodule.Will continue to monitor with scan.Stable on 09/10/19 CT scan.   5. 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  -He has 2 brotherswho helps him -I discussed our resources to help with transportation if needed. -He notes increases in billing of his current treatment. I recommendedhe consult with his financial  advocate Marguarite Arbour about this for more help. He is agreeable.  6. Chronic back pain, right flank and RUQ pain -He sees Dr. Dossie Arbour for pain management of chronic back pain.Continue to f/u with him.  -He is onOxycodone 2.82m BID. He is also on muscle relaxant Baclofen 162m -His RUQ pain is intermittent based on chemo treatment. Will monitor on next line treatment.   7.Insomnia -Secondary to abdominal pain which is notwellcontrolled.  -He does take Oxycodone at night as needed.I suggest he take OTC melatonin to help. -Abdominal pain has much improved, but did have worsening pain from hand-foot syndrome.  -He continue Xanax as needed.   8.Goal of care discussion  -The patient understands the goal of care is palliative. -I recommend DNR/DNI, he will think about it  9. Nausea, Constipation, Hand-Foot Syndrome  -Secondary to Xeloda  -Will continue antiemetics and Doculax BID. ContinueOTC topical hydrocortisone cream,Urea cream. -Dose reduced multiple times, will d/c due to disease progression. She improve and resolve.    [facility]AN: [facility]    -Plan to start him on, if his insurance approves it -Lab, f/u and infusion in 1-2 weeks    No problem-specific Assessment & Plan notes found for this encounter.   No orders of the defined types were placed in this encounter.  I discussed the assessment and treatment plan with the patient. The patient was provided an opportunity to ask questions and all were answered. The patient agreed with the plan and demonstrated an understanding of the instructions.  The patient was advised to call back or seek an in-person evaluation if the symptoms worsen or if the condition fails to improve as anticipated.  The total time spent in the appointment was 30 minutes.    YaTruitt MerleMD 09/26/2019   I, AmJoslyn Devonam acting as scribe for YaTruitt MerleMD.   I have reviewed the above documentation for accuracy and completeness, and I agree with the above.

## 2019-09-27 ENCOUNTER — Telehealth: Payer: Self-pay | Admitting: Hematology

## 2019-09-27 NOTE — Telephone Encounter (Signed)
Scheduled appt per 1/20 los.  Spoke with pt and he wasn't to sure about scheduling the appt for treatment within one to two weeks.  We went ahead and scheduled per the los, and patient is aware of the appt date and time.

## 2019-09-28 DIAGNOSIS — C220 Liver cell carcinoma: Secondary | ICD-10-CM | POA: Diagnosis not present

## 2019-09-28 DIAGNOSIS — R188 Other ascites: Secondary | ICD-10-CM | POA: Diagnosis not present

## 2019-10-03 DIAGNOSIS — C220 Liver cell carcinoma: Secondary | ICD-10-CM | POA: Diagnosis not present

## 2019-10-10 ENCOUNTER — Other Ambulatory Visit: Payer: Self-pay | Admitting: Nurse Practitioner

## 2019-10-10 DIAGNOSIS — C220 Liver cell carcinoma: Secondary | ICD-10-CM

## 2019-10-10 NOTE — Progress Notes (Deleted)
[place]   [phone] [phone]   Clinic Follow up Note   Patient Care Team: Cletis Athens, MD as PCP - General (Internal Medicine) Manya Silvas, MD (Inactive) (Gastroenterology) Milinda Pointer, MD as Referring Physician (Pain Medicine) 10/10/2019  CHIEF COMPLAINT: F/u mixed cholangiocarcinoma and [place]  SUMMARY OF ONCOLOGIC HISTORY: Oncology History  Cholangiocarcinoma ([place])  05/05/2018 Initial Biopsy   DIAGNOSIS: 05/05/18 A. LIVER MASS; ULTRASOUND-GUIDED BIOPSY:  - CYTOKERATIN 7 POSITIVE ADENOCARCINOMA WITH ABUNDANT NECROSIS, SEE  COMMENT.  Diagnosis 06/28/18 Consult- Comprehensive, [phone] Liver Bx - POORLY DIFFERENTIATED CARCINOMA INVOLVING LIVER PARENCHYMA. SEE NOTE. Diagnosis Note Most of the tumor shows a abortive glad formation, consistent with an adenocarcinoma. However, in some areas, the tumor cells show increased cytoplasm with a nested architecture, giving them a hepatoid appearance. Immunohistochemical stains performed at the outside institution show that the tumor cells are positive for CK7 and negative for TTF-1, Napsin-A, CDX-2, Hepar-1 and CD34. Immunostains performed at [facility] show that glypican-3 has patchy positive staining in the hepatoid-appearing tumor cells. Arginase and AFP stains show very focal, weak staining. Immunostain of pCEA shows focal canalicular-like staining pattern in the tumor cells. The findings are consistent with a cholangiocarcinoma but the the tumor histomorphology and staining pattern are suggestive of a hepatocellular carcinoma component, raising possibility of a combined hepatocellular cholangiocarcinoma. Additional biopsies or re-evaluation on the resection specimen is suggested for confirmation.   06/02/2018 PET scan   PET 06/02/18  IMPRESSION: 1. Solitary hypermetabolic lesion in the central LEFT hepatic lobe consistent with malignancy. 2. No metabolic activity remaining in LEFT  apical pulmonary nodule which is reduced in size. 3. No evidence of metastatic adenopathy on skull base to thigh FDG PET scan. 4. Cirrhotic liver.  Cholelithiasis 5.  Aortic Atherosclerosis (ICD10-I70.0).   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 Jearld Shines at [facility]  Estimated dose to liver target: 117 Gy Estimated dose to lung: 5.56 Gy Impression: Successful radioembolization with administration of intravascular yttrium-90 Theraspheres as described above.   10/18/2018 Imaging   US Abdomen 10/18/18 IMPRESSION: 1. Cholelithiasis without evidence of acute cholecystitis. 2. 2.2 cm left hepatic lobe mass consistent with known malignancy. 3. Cirrhosis and splenomegaly.   11/09/2018 Imaging   MRI Abdomen 11/09/18 at [facility]  Impression: Marked increased size of the mass centered in segment 4A with new satellite lesions seen in segments 2 and 3, likely representing multifocal mass-forming cholangiocarcinoma. New tumor invasion seen within the right anterior portal vein and middle hepatic vein to the level of the IVC, which is a pattern more commonly seen with hepatocellular carcinoma.   11/25/2018 Miscellaneous   Foundation One 3/21.2020  MSI -Stable - cannot be determined  Tumor Mutation Burden - cannot be determined  TERT promoter 124C>T TP53 [id number]   11/28/2018 Imaging   CT Chest 11/28/18  IMPRESSION: 1. Further improvement in left apical nodule, now nearly resolved. Mild surrounding radiation changes. 2. No new or enlarging pulmonary nodules. 3. Treated lesion in the left hepatic lobe is not optimally visualized on this noncontrast study, although appears slightly larger. 4. Progressive adenopathy in the upper abdomen. 5. Additional incidental findings are stable, including cirrhosis, splenomegaly, aortic valvular calcifications, Aortic Atherosclerosis (ICD10-I70.0) and Emphysema  (ICD10-J43.9).   12/25/2018 Cancer Staging   Staging form: Intrahepatic Bile Duct, AJCC 8th Edition - Clinical stage from 12/25/2018: Stage II (cT2(m), cN0, cM0) - Signed by [name], MD on 12/27/2018  01/24/2019 - 06/18/2019 Chemotherapy   First line Cisplatin and Gemcitabine every 2 weeks starting 01/24/19  -He did have reaction to IV Emend and dexa with SOB, chest pain and flushing, will d/c and will switch dexa to Solu-Medrol on cycle 2.   -Stopped on 06/18/19 due to disease prgression.    03/28/2019 Imaging   MRI IMPRESSION: 1. Slight interval increase in size of the large necrotic central hepatic neoplasm. 2. Stable celiac axis lymph node and slightly enlarged left para-aortic, right hepatoduodenal ligament and epicardial lymph nodes. 3. 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 4. Stable splenomegaly.   06/14/2019 Imaging    CT Chest WO Contrast 06/14/19 IMPRESSION: 1. Progressive metastatic adenopathy in the chest involving right prevascular and anterior pericardiophrenic mediastinal nodes. Progressive metastatic adenopathy in the porta hepatis. 2. Stable treated apical left upper lobe pulmonary nodule with evolving surrounding postradiation changes. 3. Please see the separate concurrent MRI abdomen report from today for details regarding the left liver lobe mass.   Aortic Atherosclerosis (ICD10-I70.0) and Emphysema (ICD10-J43.9).   06/14/2019 Imaging    MRI abdomen 06/14/19  IMPRESSION: 1. Continued mild progression of the dominant left hepatic liver mass. 2. Metastatic lymphadenopathy in the anterior mediastinum, celiac axis, hepato duodenal ligament, and para-aortic space is similar in the interval. 3. 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 4. Non opacification of the left portal  vein suggests thrombosis. 5. Splenomegaly. 6. Cholelithiasis.   06/25/2019 - 09/21/2019 Chemotherapy   Second-line chemo with oral Xeloda 2036m in the AM and 15045min the PM 2 weeks on/1 week off starting 06/25/19. Due to skin toxicity dose reduced to 150031mID 1 week on and 1 week off starting with C2 on 07/16/19. Due to hand foot syndrome with C3 will reduce Xeloda to 1500m59m AM and 1000mg66mthe PM starting C4 on 08/13/19. He reduced to 1000mg 30mfor the last week of C5. Will stop on 09/21/19 due to disease progression.    09/10/2019 Imaging   CT CAP W Contrast  IMPRESSION: 1. Bulky, multifocal rim enhancing hepatocellular carcinoma, internally necrotic appearing, is slightly increased in size compared to prior examination, a dominant mass in the anterior right lobe of the liver measuring 10.4 x 7.0 cm, previously 9.7 x 6.0 cm when measured similarly on prior MRI (series 2, image 58). 2. Interval enlargement in bulky portacaval and retroperitoneal lymph nodes, largest portacaval node measuring 4.3 x 3.5 cm, previously 2.4 x 2.1 cm (series 2, image 67). 3. Significant interval decrease in size in a previously seen bulky prevascular lymph node near the brachiocephalic confluence, now measuring 1.6 x 1.1 cm, previously 2.5 x 1.8 cm when measured similarly (series 2, image 19). 4. Above constellation of findings are consistent with worsened primary and and abdominal nodal metastatic disease but with improved thoracic adenopathy. 5. Stable appearance of left apical pulmonary nodule status post radiation therapy with adjacent radiation change. 6. Underlying cirrhotic morphology of the liver with splenomegaly and ascites. 7. Emphysema (ICD10-J43.9). 8. Aortic Atherosclerosis (ICD10-I70.0).   10/11/2019 -  Chemotherapy   The patient had atezolizumab (TECENTRIQ) 1,200 mg in sodium chloride 0.9 % 250 mL chemo infusion, 1,200 mg, Intravenous, Once, 0 of 6 cycles bevacizumab-bvzr (ZIRABEV)  1,400 mg in sodium chloride 0.9 % 100 mL chemo infusion, 15 mg/kg, Intravenous,  Once, 0 of 6  cycles  for chemotherapy treatment.      CURRENT THERAPY: PENDING immunotherapy Tecentriq with biological agent Avastin q3weeks  INTERVAL HISTORY: John [name] returns for f/u and to start treatment as scheduled.    REVIEW OF SYSTEMS:   Constitutional: Denies fevers, chills or abnormal weight loss Eyes: Denies blurriness of vision Ears, nose, mouth, throat, and face: Denies mucositis or sore throat Respiratory: Denies cough, dyspnea or wheezes Cardiovascular: Denies palpitation, chest discomfort or lower extremity swelling Gastrointestinal:  Denies nausea, heartburn or change in bowel habits Skin: Denies abnormal skin rashes Lymphatics: Denies new lymphadenopathy or easy bruising Neurological:Denies numbness, tingling or new weaknesses Behavioral/Psych: Mood is stable, no new changes  All other systems were reviewed with the patient and are negative.  MEDICAL HISTORY:  Past Medical History:  Diagnosis Date  . Abnormal MRI, lumbar spine (2015) 01/21/2016   FINDINGS:  L3-4: Tiny right foraminal and extra foraminal disc bulge adjacent to but not compressing the L3 nerve lateral to the neural foramen. L4-5: There is a small broad-based disc bulge. There is also hypertrophy of the ligamentum flavum and facet joints, left more than right creating moderately severe spinal stenosis and bilateral lateral recess stenosis, left greater than right. This appe  . Anxiety   . Back ache 05/06/2014  . Cervical pain 05/06/2014  . 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[place]) 06/23/2015  . COPD (chronic obstructive pulmonary disease) ([place])   . De Quervain's tenosynovitis, left 07/07/2017  . Depression   . Dystonia   . Epileptic disorder ([place]) 06/23/2015  . Extremity pain 05/06/2014  . Febrile seizures ([place])    child  . Foot pain 09/11/2014  . Gastric ulcer 06/23/2015  . Gout   . H/O neoplasm 06/23/2015  . Head  injury 06/23/2015  . Hemorrhoids   . Hepatic cirrhosis ([place]) 06/23/2015  . Hepatitis B   . History of GI bleed   . Leukopenia   . Liver cancer ([place]) 08/2018  . Neurogenic pain 01/21/2016  . Nodule of upper lobe of left lung 12/02/2017  . Non-small cell cancer of left lung ([place]) 2019   Rad tx's.   . Obstructive sleep apnea 06/23/2015  . 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 Osteoarthritis of spine with radiculopathy, lumbosacral region 06/23/2015  . Peripheral neuropathy 06/23/2015  . Peripheral neuropathy ([place]) 28/41/3244  . Personal history of tobacco use, presenting hazards to health 01/09/2016  . Pulse irregularity   . Seizures ([place])   . Spinal stenosis   . Splenomegaly 05/30/2013  . Thrombocytopenia ([place]) 06/23/2015  . Uncomplicated opioid dependence ([place]) 06/23/2015    SURGICAL HISTORY: Past Surgical History:  Procedure Laterality Date  . ESOPHAGOGASTRODUODENOSCOPY  11/2013  . ESOPHAGOGASTRODUODENOSCOPY (EGD) WITH PROPOFOL N/A 06/23/2018   Procedure: ESOPHAGOGASTRODUODENOSCOPY (EGD) WITH PROPOFOL;  Surgeon: Manya Silvas, MD;  Location: [facility] ENDOSCOPY;  Service: Endoscopy;  Laterality: N/A;  . surgery for cervical neck fracture    . TOOTH EXTRACTION Right 09/28/2016    I have reviewed the social history and family history with the patient and they are unchanged from previous note.  ALLERGIES:  is allergic to codeine; morphine; and duloxetine.  MEDICATIONS:  Current Outpatient Medications  Medication Sig Dispense Refill  . ALPRAZolam (XANAX) 0.25 MG tablet Take 1 tablet (0.25 mg total) by mouth at bedtime as needed for anxiety or sleep. 30 tablet 0  . Avatrombopag Maleate 20 MG TABS Take 40 mg by mouth 2 (two) times daily. 120 tablet 1  . baclofen (LIORESAL) 10 MG  tablet Take 1 tablet (10 mg total) by mouth 3 (three) times daily. 270 tablet 1  . cetirizine (ZYRTEC) 10 MG tablet Take 10 mg by mouth daily.  3  . folic acid  (FOLVITE) 1 MG tablet Take 1 mg by mouth daily.     Marland Kitchen gabapentin (NEURONTIN) 600 MG tablet Take 1 tablet (600 mg total) by mouth 3 (three) times daily. 270 tablet 1  . Magnesium 500 MG CAPS Take 500 mg by mouth 2 (two) times a day.    Marland Kitchen omeprazole (PRILOSEC) 20 MG capsule Take 1 capsule (20 mg total) by mouth daily. 90 capsule 1  . ondansetron (ZOFRAN) 8 MG tablet Take 1 tablet (8 mg total) by mouth every 8 (eight) hours as needed for nausea or vomiting. 20 tablet 0  . oxyCODONE (OXY IR/ROXICODONE) 5 MG immediate release tablet Take 0.5 tablets (2.5 mg total) by mouth 2 (two) times daily. Must last 30 days 30 tablet 0  . oxyCODONE (OXY IR/ROXICODONE) 5 MG immediate release tablet Take 0.5 tablets (2.5 mg total) by mouth 2 (two) times daily. Must last 30 days 30 tablet 0  . [START ON 10/30/2019] oxyCODONE (OXY IR/ROXICODONE) 5 MG immediate release tablet Take 0.5 tablets (2.5 mg total) by mouth 2 (two) times daily. Must last 30 days 30 tablet 0  . prochlorperazine (COMPAZINE) 10 MG tablet Take 1 tablet (10 mg total) by mouth every 6 (six) hours as needed for nausea or vomiting. 30 tablet 0  . urea (CARMOL) 10 % cream Apply topically as needed. 71 g 0   No current facility-administered medications for this visit.    PHYSICAL EXAMINATION: ECOG PERFORMANCE STATUS: {CHL ONC ECOG PS:[id number]}  There were no vitals filed for this visit. There were no vitals filed for this visit.  GENERAL:alert, no distress and comfortable SKIN: skin color, texture, turgor are normal, no rashes or significant lesions EYES: normal, Conjunctiva are pink and non-injected, sclera clear OROPHARYNX:no exudate, no erythema and lips, buccal mucosa, and tongue normal  NECK: supple, thyroid normal size, non-tender, without nodularity LYMPH:  no palpable lymphadenopathy in the cervical, axillary or inguinal LUNGS: clear to auscultation and percussion with normal breathing effort HEART: regular rate & rhythm and no murmurs  and no lower extremity edema ABDOMEN:abdomen soft, non-tender and normal bowel sounds Musculoskeletal:no cyanosis of digits and no clubbing  NEURO: alert & oriented x 3 with fluent speech, no focal motor/sensory deficits  LABORATORY DATA:  I have reviewed the data as listed CBC Latest Ref Rng & Units 09/12/2019 08/08/2019 07/10/2019  WBC 4.0 - 10.5 K/uL 3.2(L) 2.9(L) 3.2(L)  Hemoglobin 13.0 - 17.0 g/dL 10.0(L) 10.0(L) 8.8(L)  Hematocrit 39.0 - 52.0 % 31.6(L) 32.3(L) 28.8(L)  Platelets 150 - 400 K/uL 193 152 213     CMP Latest Ref Rng & Units 09/12/2019 08/08/2019 07/10/2019  Glucose 70 - 99 mg/dL 99 96 100(H)  BUN 8 - 23 mg/dL 7(L) 9 7(L)  Creatinine 0.61 - 1.24 mg/dL 0.77 0.79 0.77  Sodium 135 - 145 mmol/L 139 138 138  Potassium 3.5 - 5.1 mmol/L 3.8 4.0 4.5  Chloride 98 - 111 mmol/L 103 102 103  CO2 22 - 32 mmol/L '27 26 28  ' Calcium 8.9 - 10.3 mg/dL 8.3(L) 9.0 8.7(L)  Total Protein 6.5 - 8.1 g/dL 8.0 8.0 7.5  Total Bilirubin 0.3 - 1.2 mg/dL 0.7 0.7 0.6  Alkaline Phos 38 - 126 U/L 93 84 78  AST 15 - 41 U/L '28 27 30  ' ALT 0 -  44 U/L '9 12 11      ' RADIOGRAPHIC STUDIES: I have personally reviewed the radiological images as listed and agreed with the findings in the report. No results found.   ASSESSMENT & PLAN:  No problem-specific Assessment & Plan notes found for this encounter.   No orders of the defined types were placed in this encounter.  All questions were answered. The patient knows to call the clinic with any problems, questions or concerns. No barriers to learning was detected. I spent {CHL ONC TIME VISIT - OXBDZ:[id number]} counseling the patient face to face. The total time spent in the appointment was {CHL ONC TIME VISIT - MHDQQ:[id number]} and more than 50% was on counseling and review of test results     Alla Feeling, NP 10/10/19

## 2019-10-11 ENCOUNTER — Ambulatory Visit: Payer: Medicare Other | Admitting: Nurse Practitioner

## 2019-10-11 ENCOUNTER — Ambulatory Visit: Payer: Medicare Other

## 2019-10-11 ENCOUNTER — Other Ambulatory Visit: Payer: Medicare Other

## 2019-10-12 DIAGNOSIS — Z87891 Personal history of nicotine dependence: Secondary | ICD-10-CM | POA: Diagnosis not present

## 2019-10-12 DIAGNOSIS — R109 Unspecified abdominal pain: Secondary | ICD-10-CM | POA: Diagnosis not present

## 2019-10-12 DIAGNOSIS — C221 Intrahepatic bile duct carcinoma: Secondary | ICD-10-CM | POA: Diagnosis not present

## 2019-10-12 DIAGNOSIS — R772 Abnormality of alphafetoprotein: Secondary | ICD-10-CM | POA: Diagnosis not present

## 2019-10-12 DIAGNOSIS — G893 Neoplasm related pain (acute) (chronic): Secondary | ICD-10-CM | POA: Diagnosis not present

## 2019-10-12 DIAGNOSIS — Z5112 Encounter for antineoplastic immunotherapy: Secondary | ICD-10-CM | POA: Diagnosis not present

## 2019-10-12 DIAGNOSIS — C7989 Secondary malignant neoplasm of other specified sites: Secondary | ICD-10-CM | POA: Diagnosis not present

## 2019-10-12 DIAGNOSIS — C220 Liver cell carcinoma: Secondary | ICD-10-CM | POA: Diagnosis not present

## 2019-10-12 DIAGNOSIS — D696 Thrombocytopenia, unspecified: Secondary | ICD-10-CM | POA: Diagnosis not present

## 2019-10-12 DIAGNOSIS — Z923 Personal history of irradiation: Secondary | ICD-10-CM | POA: Diagnosis not present

## 2019-10-15 ENCOUNTER — Other Ambulatory Visit: Payer: Self-pay | Admitting: Hematology

## 2019-10-15 ENCOUNTER — Telehealth: Payer: Self-pay

## 2019-10-15 ENCOUNTER — Other Ambulatory Visit: Payer: Self-pay

## 2019-10-15 NOTE — Progress Notes (Signed)
error 

## 2019-10-15 NOTE — Telephone Encounter (Signed)
John [name] left vm that he needs to be scheduled for treatment on Friday 2/26.  He was treated at [facility] on 10/12/2019.  Scheduling message sent to change 2/10 appointments to 2/26

## 2019-10-16 ENCOUNTER — Telehealth: Payer: Self-pay | Admitting: Hematology

## 2019-10-16 NOTE — Telephone Encounter (Signed)
R/s appt per 2/8 sch message - unable to reach pt . Left message with appt date and time

## 2019-10-17 ENCOUNTER — Ambulatory Visit: Payer: Medicare Other | Admitting: Nurse Practitioner

## 2019-10-17 ENCOUNTER — Ambulatory Visit: Payer: Medicare Other

## 2019-10-17 ENCOUNTER — Other Ambulatory Visit: Payer: Medicare Other

## 2019-11-01 NOTE — Progress Notes (Signed)
[place] OFFICE PROGRESS NOTE  Cletis Athens, MD [place] [address]  DIAGNOSIS: CHIEF COMPLAINT: F/u ofadenocarcinoma in liver  SUMMARY OF ONCOLOGIC HISTORY:    Oncology History  Cholangiocarcinoma ([place])  05/05/2018 Initial Biopsy   DIAGNOSIS: 05/05/18 A. LIVER MASS; ULTRASOUND-GUIDED BIOPSY:  - CYTOKERATIN 7 POSITIVE ADENOCARCINOMA WITH ABUNDANT NECROSIS, SEE  COMMENT.  Diagnosis 06/28/18 Consult- Comprehensive, [phone] Liver Bx - POORLY DIFFERENTIATED CARCINOMA INVOLVING LIVER PARENCHYMA. SEE NOTE. Diagnosis Note Most of the tumor shows a abortive glad formation, consistent with an adenocarcinoma. However, in some areas, the tumor cells show increased cytoplasm with a nested architecture, giving them a hepatoid appearance. Immunohistochemical stains performed at the outside institution show that the tumor cells are positive for CK7 and negative for TTF-1, Napsin-A, CDX-2, Hepar-1 and CD34. Immunostains performed at [facility] show that glypican-3 has patchy positive staining in the hepatoid-appearing tumor cells. Arginase and AFP stains show very focal, weak staining. Immunostain of pCEA shows focal canalicular-like staining pattern in the tumor cells. The findings are consistent with a cholangiocarcinoma but the the tumor histomorphology and staining pattern are suggestive of a hepatocellular carcinoma component, raising possibility of a combined hepatocellular cholangiocarcinoma. Additional biopsies or re-evaluation on the resection specimen is suggested for confirmation.   06/02/2018 PET scan   PET 06/02/18  IMPRESSION: 1. Solitary hypermetabolic lesion in the central LEFT hepatic lobe consistent with malignancy. 2. No metabolic activity remaining in LEFT apical pulmonary nodule which is reduced in size. 3. No evidence of metastatic adenopathy on skull base to thigh FDG PET scan. 4. Cirrhotic liver. Cholelithiasis 5. Aortic  Atherosclerosis (ICD10-I70.0).   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 Jearld Shines at [facility]  Estimated dose to liver target: 117 Gy Estimated dose to lung: 5.56 Gy Impression: Successful radioembolization with administration of intravascular yttrium-90 Theraspheres as described above.   10/18/2018 Imaging   US Abdomen 10/18/18 IMPRESSION: 1. Cholelithiasis without evidence of acute cholecystitis. 2. 2.2 cm left hepatic lobe mass consistent with known malignancy. 3. Cirrhosis and splenomegaly.   11/09/2018 Imaging   MRI Abdomen 11/09/18 at [facility]  Impression: Marked increased size of the mass centered in segment 4A with new satellite lesions seen in segments 2 and 3, likely representing multifocal mass-forming cholangiocarcinoma. New tumor invasion seen within the right anterior portal vein and middle hepatic vein to the level of the IVC, which is a pattern more commonly seen with hepatocellular carcinoma.   11/25/2018 Miscellaneous   Foundation One 3/21.2020  MSI -Stable - cannot be determined  Tumor Mutation Burden - cannot be determined  TERT promoter 124C>T TP53 [id number]   11/28/2018 Imaging   CT Chest 11/28/18  IMPRESSION: 1. Further improvement in left apical nodule, now nearly resolved. Mild surrounding radiation changes. 2. No new or enlarging pulmonary nodules. 3. Treated lesion in the left hepatic lobe is not optimally visualized on this noncontrast study, although appears slightly larger. 4. Progressive adenopathy in the upper abdomen. 5. Additional incidental findings are stable, including cirrhosis, splenomegaly, aortic valvular calcifications, Aortic Atherosclerosis (ICD10-I70.0) and Emphysema (ICD10-J43.9).   12/25/2018 Cancer Staging   Staging form: Intrahepatic Bile Duct, AJCC 8th Edition - Clinical stage from 12/25/2018: Stage II (cT2(m), cN0, cM0) - Signed by  [name], MD on 12/27/2018   01/24/2019 - 06/18/2019 Chemotherapy   First line Cisplatin and Gemcitabine every 2 weeks starting 01/24/19  -He did have reaction to IV Emend and dexa with  SOB, chest pain and flushing, will d/c and will switch dexa to Solu-Medrol on cycle 2.   -Stopped on 06/18/19 due to disease prgression.    03/28/2019 Imaging   MRI IMPRESSION: 1. Slight interval increase in size of the large necrotic central hepatic neoplasm. 2. Stable celiac axis lymph node and slightly enlarged left para-aortic, right hepatoduodenal ligament and epicardial lymph nodes. 3. 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 4. Stable splenomegaly.   06/14/2019 Imaging    CT Chest WO Contrast 06/14/19 IMPRESSION: 1. Progressive metastatic adenopathy in the chest involving right prevascular and anterior pericardiophrenic mediastinal nodes. Progressive metastatic adenopathy in the porta hepatis. 2. Stable treated apical left upper lobe pulmonary nodule with evolving surrounding postradiation changes. 3. Please see the separate concurrent MRI abdomen report from today for details regarding the left liver lobe mass.  Aortic Atherosclerosis (ICD10-I70.0) and Emphysema (ICD10-J43.9).   06/14/2019 Imaging    MRI abdomen 06/14/19  IMPRESSION: 1. Continued mild progression of the dominant left hepatic liver mass. 2. Metastatic lymphadenopathy in the anterior mediastinum, celiac axis, hepato duodenal ligament, and para-aortic space is similar in the interval. 3. 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 4. Non opacification of the left portal vein suggests thrombosis. 5. Splenomegaly. 6. Cholelithiasis.   06/25/2019 - 09/21/2019 Chemotherapy   Second-line chemo with oral Xeloda 2000mg in the AM and  1500mg in the PM 2 weeks on/1 week off starting 06/25/19.Due to skin toxicity dose reduced to 1500mg BID 1 week on and 1 week off starting with C2 on 07/16/19.Due to hand foot syndrome with C3 will reduce Xeloda to 1500mg in AM and 1000mg in the PM starting C4 on 08/13/19.He reduced to 1000mg BID for the last week of C5. Will stop on 09/21/19 due to disease progression.    09/10/2019 Imaging   CT CAP W Contrast  IMPRESSION: 1. Bulky, multifocal rim enhancing hepatocellular carcinoma, internally necrotic appearing, is slightly increased in size compared to prior examination, a dominant mass in the anterior right lobe of the liver measuring 10.4 x 7.0 cm, previously 9.7 x 6.0 cm when measured similarly on prior MRI (series 2, image 58). 2. Interval enlargement in bulky portacaval and retroperitoneal lymph nodes, largest portacaval node measuring 4.3 x 3.5 cm, previously 2.4 x 2.1 cm (series 2, image 67). 3. Significant interval decrease in size in a previously seen bulky prevascular lymph node near the brachiocephalic confluence, now measuring 1.6 x 1.1 cm, previously 2.5 x 1.8 cm when measured similarly (series 2, image 19). 4. Above constellation of findings are consistent with worsened primary and and abdominal nodal metastatic disease but with improved thoracic adenopathy. 5. Stable appearance of left apical pulmonary nodule status post radiation therapy with adjacent radiation change. 6. Underlying cirrhotic morphology of the liver with splenomegaly and ascites. 7. Emphysema (ICD10-J43.9). 8. Aortic Atherosclerosis (ICD10-I70.0).   10/05/2019 -  Chemotherapy   The patient had atezolizumab (TECENTRIQ) 1,200 mg in sodium chloride 0.9 % 250 mL chemo infusion, 1,200 mg, Intravenous, Once, 0 of 6 cycles bevacizumab-bvzr (ZIRABEV) 1,400 mg in sodium chloride 0.9 % 100 mL chemo infusion, 15 mg/kg, Intravenous,  Once, 0 of 6 cycles  for chemotherapy treatment.      CURRENT THERAPY:  Tecentriq 1,200 mg and Avastin 15 mg/kg IV every 3 weeks. First dose on 10/11/2019. Status post 1 cycle.     INTERVAL HISTORY: John [name] 63 y.o. male returns to the clinic for a follow up visit. The patient is feeling fair today without any concerning complaints except he had a fever and chills on 2/16. He took a tylenol to alleviate his fever. He did not alert any of his medical providers. His next documented temperature was 99.7 on 2/18. He denies any sick contacts. He denies any sore throat, shortness of breath, skin infections, diarrhea, vomiting, or loss of taste/smell. He states he occasionally gets dysuria "every once in awhile", the most recent being "one day last week". He denied any cough but states he coughs "when he feels like he has a frog in his throat" to get phlegm up but that it "goes away" after he gets up clear phlegm.  The patient recently received his first cycle of treatment with Tecentriq and Avastin and tolerated it well without any adverse side effects except for nausea without vomiting. He states he took his prescribed antiemetic (zofran and compazine) once. He states he also had an episode of mild self limiting epistaxis last week that was provoked by forceful blowing of his nose. He states the bleeding lasted <5 minutes. Denies any other bleeding or bruising including gingival bleeding, melena, hematochezia, hemoptysis, hematemesis, or hematuria.     He denies any he lost a few pounds since his appointment last month. He states his appetite is "not bad".  He denies any chest pain or shortness of breath. He reports some occasional constipation likely secondary to his pain medication use. His last bowel movement was yesterday and was of normal color and consistency. He continues to have intermittent RUQ/RLQ abdominal pain that is intermittent. He is not having any abdominal pain at this time. He had been seeing Dr. Naveira from pain management. His dosing of his pain medication  was when he saw seen at [facility] and he expressed confusion for which office he should be reaching out to regarding refills.  He denies any rashes or skin changes. The patient is here today for evaluation before starting cycle #2 of Tecentriq and Avastin.  MEDICAL HISTORY: Past Medical History:  Diagnosis Date  . Abnormal MRI, lumbar spine (2015) 01/21/2016   FINDINGS:  L3-4: Tiny right foraminal and extra foraminal disc bulge adjacent to but not compressing the L3 nerve lateral to the neural foramen. L4-5: There is a small broad-based disc bulge. There is also hypertrophy of the ligamentum flavum and facet joints, left more than right creating moderately severe spinal stenosis and bilateral lateral recess stenosis, left greater than right. This appe  . Anxiety   . Back ache 05/06/2014  . Cervical pain 05/06/2014  . 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HCC) 06/23/2015  . COPD (chronic obstructive pulmonary disease) (HCC)   . De Quervain's tenosynovitis, left 07/07/2017  . Depression   . Dystonia   . Epileptic disorder (HCC) 06/23/2015  . Extremity pain 05/06/2014  . Febrile seizures (HCC)    child  . Foot pain 09/11/2014  . Gastric ulcer 06/23/2015  . Gout   . H/O neoplasm 06/23/2015  . Head injury 06/23/2015  . Hemorrhoids   . Hepatic cirrhosis (HCC) 06/23/2015  . Hepatitis B   . History of GI bleed   . Leukopenia   . Liver cancer (HCC) 08/2018  . Neurogenic pain 01/21/2016  . Nodule of upper lobe of left lung 12/02/2017  . Non-small cell cancer of left lung (HCC) 2019   Rad tx's.   . Obstructive sleep apnea 06/23/2015  .   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 Osteoarthritis of spine with radiculopathy, lumbosacral region 06/23/2015  . Peripheral neuropathy 06/23/2015  . Peripheral neuropathy (Alcoholic) 37/06/6268  . Personal history of tobacco use, presenting hazards to health 01/09/2016  . Pulse irregularity   . Seizures ([place])   . Spinal stenosis   .  Splenomegaly 05/30/2013  . Thrombocytopenia ([place]) 06/23/2015  . Uncomplicated opioid dependence ([place]) 06/23/2015    ALLERGIES:  is allergic to codeine; morphine; oxycodone; and duloxetine.  MEDICATIONS:  Current Outpatient Medications  Medication Sig Dispense Refill  . ALPRAZolam (XANAX) 0.25 MG tablet Take 1 tablet (0.25 mg total) by mouth at bedtime as needed for anxiety or sleep. 30 tablet 0  . Avatrombopag Maleate 20 MG TABS Take 40 mg by mouth 2 (two) times daily. 120 tablet 1  . baclofen (LIORESAL) 10 MG tablet Take 1 tablet (10 mg total) by mouth 3 (three) times daily. 270 tablet 1  . cetirizine (ZYRTEC) 10 MG tablet Take 10 mg by mouth daily.  3  . folic acid (FOLVITE) 1 MG tablet Take 1 mg by mouth daily.     Marland Kitchen gabapentin (NEURONTIN) 600 MG tablet Take 1 tablet (600 mg total) by mouth 3 (three) times daily. 270 tablet 1  . Magnesium 500 MG CAPS Take 500 mg by mouth 2 (two) times a day.    Marland Kitchen omeprazole (PRILOSEC) 20 MG capsule Take 1 capsule (20 mg total) by mouth daily. 90 capsule 1  . ondansetron (ZOFRAN) 8 MG tablet Take 1 tablet (8 mg total) by mouth every 8 (eight) hours as needed for nausea or vomiting. 20 tablet 0  . oxyCODONE (OXY IR/ROXICODONE) 5 MG immediate release tablet Take 0.5 tablets (2.5 mg total) by mouth 2 (two) times daily. Must last 30 days 30 tablet 0  . prochlorperazine (COMPAZINE) 10 MG tablet Take 1 tablet (10 mg total) by mouth every 6 (six) hours as needed for nausea or vomiting. 30 tablet 0  . urea (CARMOL) 10 % cream Apply topically as needed. 71 g 0   No current facility-administered medications for this visit.   Facility-Administered Medications Ordered in Other Visits  Medication Dose Route Frequency Provider Last Rate Last Admin  . atezolizumab (TECENTRIQ) 1,200 mg in sodium chloride 0.9 % 250 mL chemo infusion  1,200 mg Intravenous Once [name], MD      . bevacizumab-bvzr (ZIRABEV) 1,400 mg in sodium chloride 0.9 % 100 mL chemo infusion  15 mg/kg  (Order-Specific) Intravenous Once [name], MD        SURGICAL HISTORY:  Past Surgical History:  Procedure Laterality Date  . ESOPHAGOGASTRODUODENOSCOPY  11/2013  . ESOPHAGOGASTRODUODENOSCOPY (EGD) WITH PROPOFOL N/A 06/23/2018   Procedure: ESOPHAGOGASTRODUODENOSCOPY (EGD) WITH PROPOFOL;  Surgeon: Manya Silvas, MD;  Location: [facility] ENDOSCOPY;  Service: Endoscopy;  Laterality: N/A;  . surgery for cervical neck fracture    . TOOTH EXTRACTION Right 09/28/2016    REVIEW OF SYSTEMS:   Review of Systems  Constitutional: Positive for weight loss. Positive for fevers/chills 10 days ago. Negative for appetite change. HENT: Positive for one episode of mild self limiting nosebleed x1 left nostril.  Negative for mouth sores, sore throat and trouble swallowing.   Eyes: Negative for eye problems and icterus.  Respiratory: Negative for cough, hemoptysis, shortness of breath and wheezing.   Cardiovascular: Negative for chest pain and leg swelling.  Gastrointestinal: Positive for intermittent abdominal pain and constipation. Negative for diarrhea, nausea and vomiting.  Genitourinary: Positive for  questionable dysuria 1 week ago. Negative for bladder incontinence, difficulty urinating, frequency and hematuria.   Musculoskeletal: Negative for back pain, gait problem, neck pain and neck stiffness.  Skin: Negative for itching and rash.  Neurological: Negative for dizziness, extremity weakness, gait problem, headaches, light-headedness and seizures.  Hematological: Negative for adenopathy. Does not bruise/bleed easily.  Psychiatric/Behavioral: Negative for confusion, depression and sleep disturbance. The patient is not nervous/anxious.     PHYSICAL EXAMINATION:  Blood pressure 139/67, pulse 85, temperature 98.3 F (36.8 C), temperature source Oral, resp. rate 18, height 6' 1" (1.854 m), weight 197 lb 3.2 oz (89.4 kg), SpO2 100 %.  ECOG PERFORMANCE STATUS: 1 - Symptomatic but completely  ambulatory  Physical Exam  Constitutional: Oriented to person, place, and time and chronically ill appearing male and in no distress.  HENT:  Head: Normocephalic and atraumatic.  Mouth/Throat: Oropharynx is clear and moist. No oropharyngeal exudate.  Eyes: Conjunctivae are normal. Right eye exhibits no discharge. Left eye exhibits no discharge. No scleral icterus.  Neck: Normal range of motion. Neck supple.  Cardiovascular: Normal rate, regular rhythm, normal heart sounds and intact distal pulses.   Pulmonary/Chest: Effort normal. Quiet breath sounds in right lung. No respiratory distress. No wheezes. No rales.  Abdominal: Soft. Tenderness in RUQ and RLQ. Bowel sounds are normal. Distended abdomen. No mass.  Musculoskeletal: Normal range of motion. Exhibits no edema.  Lymphadenopathy:    No cervical adenopathy.  Neurological: Tremor noted. Alert and oriented to person, place, and time. Exhibits normal muscle tone. Gait normal. Coordination normal.  Skin: Skin is warm and dry. No rash noted. Not diaphoretic. No erythema. No pallor.  Psychiatric: Mood, memory and judgment normal.  Vitals reviewed.  LABORATORY DATA: Lab Results  Component Value Date   WBC 1.9 (L) 11/02/2019   HGB 10.3 (L) 11/02/2019   HCT 33.4 (L) 11/02/2019   MCV 95.4 11/02/2019   PLT 77 (L) 11/02/2019      Chemistry      Component Value Date/Time   NA 139 11/02/2019 1017   NA 140 04/18/2012 0505   K 4.0 11/02/2019 1017   K 3.5 04/18/2012 0505   CL 106 11/02/2019 1017   CL 104 04/18/2012 0505   CO2 26 11/02/2019 1017   CO2 29 04/18/2012 0505   BUN 6 (L) 11/02/2019 1017   BUN 2 (L) 04/18/2012 0505   CREATININE 0.70 11/02/2019 1017   CREATININE 0.59 (L) 11/09/2012 1705      Component Value Date/Time   CALCIUM 8.2 (L) 11/02/2019 1017   CALCIUM 8.0 (L) 04/18/2012 0505   ALKPHOS 120 11/02/2019 1017   ALKPHOS 140 (H) 11/09/2012 1705   AST 38 11/02/2019 1017   ALT 15 11/02/2019 1017   ALT 34 11/09/2012  1705   BILITOT 0.8 11/02/2019 1017       RADIOGRAPHIC STUDIES:  No results found.   ASSESSMENT/PLAN:  John [name] is a 63-year-old Caucasian male with:   1.Poorly differentiated adenocarcinoma in liver,probablecholangiocarcinoma with HCC component, unresectable, now with distant node metastasis -He was diagnosed in 04/2018. His liver biopsy showed poorly differentiated adenocarcinoma, CK7 positive, other markers were negative.No other primary tumor seen on scan or endoscopy. -He was seen by surgeon Dr. Allen at [facility] in 07/2018 who did notthink he is a candidate for surgical resection.He underwentY90 radio embolization on12/19/19with Dr. Prescott at [facility].  -he has progressed throughfirst linechemocisplatin and gemcitabine -Hestartedsecond line chemo with oral Xeloda 2000mg in the AM and 1500mg in the PM 2   weeks on/1 week off starting 06/25/19. Dose was adjusted multiple times due to hand foot syndrome.  -Unfortunately he had cancer progression based on 09/10/19 CT CAP. Xeloda was stopped 09/21/19. -He was seen by Dr. Joyce Gross at [facility]. His AFP was over 1000. 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  She will ask pathologist at [facility] to review his 2 biopsies, and get an abdominal MRI for further evaluation.  -While Dr. Joyce Gross is still looking for eligible clinical trails for him, we recommend he start immunotherapy Tecentriq with biological agent Avastin q3weeks in the meantime. Dr. Burr Medico  reviewed the data of discretionary [facility] treatment, which is probably the most effective regiment at this point.  Potential side effect from Tecentriq and Avastin, especially pneumonitis, colitis, endocrine disorders, hypertension, proteinuria, thrombosis and bleeding, bowel perforation, etc., were discussed with him in details.  He agrees to proceed. -The goal of therapy is palliative, for disease control and prolong his  life. -His last EGD was 2 years ago, no significant varices since that time.  He may need a repeated EGD dyspnea. -It reasonable to have PAC placed when he is ready, he will think about it, but is leaning towards it.  -He is status post cycle #1 of Avastin and Tecentriq which was [facility] at [facility]  2.Hepatitis B and liver cirrhosis -He doesn't see a hepatologist nor an infectious disease physician,he has never received hepatitis B treatment. -Dr. Para Skeans him to return to GI Dr. Tiffany Kocher for management.  3. 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 -Given he is on chemotherapy, Dr. Penelope Galas him Doptelet(Avatrombopag) 62m BID. He was on Doptelet with Xeloda. -Given normalized PLT and dose reduction of Xeloda, wepreviouslyreducedDoptelet to 240monce daily (07/10/19). This was discontinued in ~09/2019.  [facility] Labs reviewed with Dr. FeBurr Medicond the patient is ok to proceed with cycle #2 today as scheduled.   4. Left apical lungcancer stage IA,s/p SBRT, COPD -PET scanon 11/24/2017 revealed a hypermetabolic 1.0 cm left apical pulmonary nodulefavoring malignancy. Assuming non-small cell lung cancer this represents T1a N0 M0 disease (stage IA).Biopsy was not obtained -Underwent SBRT 01/05/18-02/01/18 -CTchest from 06/14/19 showsprogression of metastatic adenopathy in the chest, stable treated left lung nodule.Will continue to monitor with scan.Stable on 09/10/19 CT scan.  5. Social Support, FiAcupuncturistHe lives by himself.  -He has 2 brotherswho helps him -Discussed our resources to help with transportation if needed. -He notes increases in billing of his current treatment. Dr. FeBurr Medicoecommendedhe consult with his financial advocate ShMarguarite Arbourbout this for more help, previously. He is agreeable.  6. Chronic back pain,  right flank and RUQ pain -He sees Dr. NaDossie Arbouror pain management of chronic back pain.Continue to f/u with him.  -He is onOxycodone 2.41m39mID. He is also on muscle relaxant Baclofen 56m6mHis RUQ painis intermittent based on chemo treatment. No pain today. -Advised patient to reach out to Dr. NaveTyna Jakschice regarding refill requests.   7.Insomnia -Secondary to abdominal pain which is notwellcontrolled.  -He does take Oxycodone at night as needed.Dr. FengBurr Medicogest he take OTC melatonin to help. -Abdominal pain has much improved, but did have worsening pain from hand-foot syndrome. Which has since resolved. -He continue Xanax as needed.  8.Goal of care discussion  -The patient understands the goal of care is palliative. -Dr. fengBurr Medicoommend DNR/DNI, he will think  about it  9. Nausea, Constipation, Hand-Foot Syndrome  -Secondary to Xeloda  -Will continue antiemetics (compazine and zofran) and Doculax BID. ContinueOTC topical hydrocortisone cream,Urea cream. -Dose reducedmultiple times, will d/c due to disease progression.Should improve and resolve.   10. Pancytopenia -WBC 1.9 and ANC 1.3 today. Platelets 77k today likely secondary to #2 above. Labs reviewed with Dr. Feng who recommended the patient proceed with treatment as planned today.  -Will obtain UA/culture, CBC, CMP, and blood cultures today given fever ~10 days ago. UA unremarkable. CMP unremarkable. Offered patient CXR which he declined. The patient was strongly advised to please contact us immediately if he develops a fever in the future or s/sx of infection including chills, night sweats, skin infections, dysuria, cough, nasal congestion, sore throat, shortness of breath, etc.   11. Epistaxis (mild and self limiting x1) -Given patient is on Avastin, he was advised to call us with any significant bleeding.  -One episode of mild self limiting epistaxis (<5 minutes) provoked by forceful blowing. Cautioned the  patient to avoid forceful blowing of his nose. Will continue to monitor given his current treatment with Avastin.  -If he develops significant/uncontrolled bleeding, he was advised to go to the emergency room.  -No other bleeding including gingival bleeding, hematemesis, hematochezia, melena, hematuria, etc.     Plan:  -Will proceed with cycle #2 today as scheduled -Labs, return visit, an infusion for cycle #3 in 3 weeks.  -Ordered UA, urine culture, and blood cultures today.   Orders Placed This Encounter  Procedures  . Culture, Urine    Standing Status:   Future    Number of Occurrences:   1    Standing Expiration Date:   11/01/2020  . Urinalysis, Complete w Microscopic    Standing Status:   Future    Number of Occurrences:   1    Standing Expiration Date:   11/01/2020     Cassandra L Heilingoetter, PA-C 11/02/19  

## 2019-11-02 ENCOUNTER — Encounter: Payer: Self-pay | Admitting: Physician Assistant

## 2019-11-02 ENCOUNTER — Other Ambulatory Visit: Payer: Self-pay | Admitting: Medical Oncology

## 2019-11-02 ENCOUNTER — Inpatient Hospital Stay (HOSPITAL_BASED_OUTPATIENT_CLINIC_OR_DEPARTMENT_OTHER): Payer: Medicare Other | Admitting: Physician Assistant

## 2019-11-02 ENCOUNTER — Inpatient Hospital Stay: Payer: Medicare Other

## 2019-11-02 ENCOUNTER — Other Ambulatory Visit: Payer: Self-pay

## 2019-11-02 ENCOUNTER — Inpatient Hospital Stay: Payer: Medicare Other | Attending: Hematology

## 2019-11-02 VITALS — BP 139/67 | HR 85 | Temp 98.3°F | Resp 18 | Ht 73.0 in | Wt 197.2 lb

## 2019-11-02 DIAGNOSIS — R300 Dysuria: Secondary | ICD-10-CM

## 2019-11-02 DIAGNOSIS — C220 Liver cell carcinoma: Secondary | ICD-10-CM

## 2019-11-02 DIAGNOSIS — C221 Intrahepatic bile duct carcinoma: Secondary | ICD-10-CM

## 2019-11-02 DIAGNOSIS — C781 Secondary malignant neoplasm of mediastinum: Secondary | ICD-10-CM | POA: Insufficient documentation

## 2019-11-02 DIAGNOSIS — R509 Fever, unspecified: Secondary | ICD-10-CM | POA: Diagnosis not present

## 2019-11-02 DIAGNOSIS — Z5112 Encounter for antineoplastic immunotherapy: Secondary | ICD-10-CM | POA: Diagnosis not present

## 2019-11-02 DIAGNOSIS — C801 Malignant (primary) neoplasm, unspecified: Secondary | ICD-10-CM | POA: Diagnosis not present

## 2019-11-02 DIAGNOSIS — D696 Thrombocytopenia, unspecified: Secondary | ICD-10-CM

## 2019-11-02 DIAGNOSIS — R040 Epistaxis: Secondary | ICD-10-CM | POA: Diagnosis not present

## 2019-11-02 DIAGNOSIS — Z79899 Other long term (current) drug therapy: Secondary | ICD-10-CM | POA: Insufficient documentation

## 2019-11-02 DIAGNOSIS — C787 Secondary malignant neoplasm of liver and intrahepatic bile duct: Secondary | ICD-10-CM | POA: Diagnosis not present

## 2019-11-02 DIAGNOSIS — C7802 Secondary malignant neoplasm of left lung: Secondary | ICD-10-CM | POA: Diagnosis not present

## 2019-11-02 DIAGNOSIS — C229 Malignant neoplasm of liver, not specified as primary or secondary: Secondary | ICD-10-CM

## 2019-11-02 LAB — CBC WITH DIFFERENTIAL (CANCER CENTER ONLY)
Abs Immature Granulocytes: 0 10*3/uL (ref 0.00–0.07)
Basophils Absolute: 0 10*3/uL (ref 0.0–0.1)
Basophils Relative: 1 %
Eosinophils Absolute: 0.1 10*3/uL (ref 0.0–0.5)
Eosinophils Relative: 5 %
HCT: 33.4 % — ABNORMAL LOW (ref 39.0–52.0)
Hemoglobin: 10.3 g/dL — ABNORMAL LOW (ref 13.0–17.0)
Immature Granulocytes: 0 %
Lymphocytes Relative: 19 %
Lymphs Abs: 0.4 10*3/uL — ABNORMAL LOW (ref 0.7–4.0)
MCH: 29.4 pg (ref 26.0–34.0)
MCHC: 30.8 g/dL (ref 30.0–36.0)
MCV: 95.4 fL (ref 80.0–100.0)
Monocytes Absolute: 0.2 10*3/uL (ref 0.1–1.0)
Monocytes Relative: 9 %
Neutro Abs: 1.3 10*3/uL — ABNORMAL LOW (ref 1.7–7.7)
Neutrophils Relative %: 66 %
Platelet Count: 77 10*3/uL — ABNORMAL LOW (ref 150–400)
RBC: 3.5 MIL/uL — ABNORMAL LOW (ref 4.22–5.81)
RDW: 18.7 % — ABNORMAL HIGH (ref 11.5–15.5)
WBC Count: 1.9 10*3/uL — ABNORMAL LOW (ref 4.0–10.5)
nRBC: 0 % (ref 0.0–0.2)

## 2019-11-02 LAB — URINALYSIS, COMPLETE (UACMP) WITH MICROSCOPIC
Bacteria, UA: NONE SEEN
Bilirubin Urine: NEGATIVE
Glucose, UA: NEGATIVE mg/dL
Hgb urine dipstick: NEGATIVE
Ketones, ur: NEGATIVE mg/dL
Leukocytes,Ua: NEGATIVE
Nitrite: NEGATIVE
Protein, ur: NEGATIVE mg/dL
Specific Gravity, Urine: 1.01 (ref 1.005–1.030)
pH: 6 (ref 5.0–8.0)

## 2019-11-02 LAB — CMP (CANCER CENTER ONLY)
ALT: 15 U/L (ref 0–44)
AST: 38 U/L (ref 15–41)
Albumin: 3.3 g/dL — ABNORMAL LOW (ref 3.5–5.0)
Alkaline Phosphatase: 120 U/L (ref 38–126)
Anion gap: 7 (ref 5–15)
BUN: 6 mg/dL — ABNORMAL LOW (ref 8–23)
CO2: 26 mmol/L (ref 22–32)
Calcium: 8.2 mg/dL — ABNORMAL LOW (ref 8.9–10.3)
Chloride: 106 mmol/L (ref 98–111)
Creatinine: 0.7 mg/dL (ref 0.61–1.24)
GFR, Est AFR Am: >60 mL/min (ref >60)
GFR, Estimated: >60 mL/min (ref >60)
Glucose, Bld: 83 mg/dL (ref 70–99)
Potassium: 4 mmol/L (ref 3.5–5.1)
Sodium: 139 mmol/L (ref 135–145)
Total Bilirubin: 0.8 mg/dL (ref 0.3–1.2)
Total Protein: 7.6 g/dL (ref 6.5–8.1)

## 2019-11-02 LAB — TOTAL PROTEIN, URINE DIPSTICK: Protein, ur: NEGATIVE mg/dL

## 2019-11-02 LAB — TSH: TSH: 0.578 u[IU]/mL (ref 0.320–4.118)

## 2019-11-02 MED ORDER — SODIUM CHLORIDE 0.9 % IV SOLN
1200.0000 mg | Freq: Once | INTRAVENOUS | Status: AC
  Administered 2019-11-02: 13:00:00 1200 mg via INTRAVENOUS
  Filled 2019-11-02: qty 20

## 2019-11-02 MED ORDER — SODIUM CHLORIDE 0.9 % IV SOLN
Freq: Once | INTRAVENOUS | Status: AC
  Filled 2019-11-02: qty 250

## 2019-11-02 MED ORDER — SODIUM CHLORIDE 0.9 % IV SOLN
15.0000 mg/kg | Freq: Once | INTRAVENOUS | Status: AC
  Administered 2019-11-02: 1400 mg via INTRAVENOUS
  Filled 2019-11-02: qty 48

## 2019-11-02 NOTE — Patient Instructions (Signed)
[facility] Discharge Instructions for Patients Receiving Chemotherapy  Today you received the following chemotherapy agents: atezolizumab and bevacizumab.  To help prevent nausea and vomiting after your treatment, we encourage you to take your nausea medication as directed.    If you develop nausea and vomiting that is not controlled by your nausea medication, call the clinic.   BELOW ARE SYMPTOMS THAT SHOULD BE REPORTED IMMEDIATELY:  *FEVER GREATER THAN 100.5 F  *CHILLS WITH OR WITHOUT FEVER  NAUSEA AND VOMITING THAT IS NOT CONTROLLED WITH YOUR NAUSEA MEDICATION  *UNUSUAL SHORTNESS OF BREATH  *UNUSUAL BRUISING OR BLEEDING  TENDERNESS IN MOUTH AND THROAT WITH OR WITHOUT PRESENCE OF ULCERS  *URINARY PROBLEMS  *BOWEL PROBLEMS  UNUSUAL RASH Items with * indicate a potential emergency and should be followed up as soon as possible.  Feel free to call the clinic should you have any questions or concerns. The clinic phone number is [phone].  Please show the [facility] at check-in to the Emergency Department and triage nurse.

## 2019-11-02 NOTE — Progress Notes (Signed)
Per Cassie Heilingoetter PA-C, ok to treat with platelets 77 and ANC 1.3.

## 2019-11-03 LAB — URINE CULTURE: Culture: NO GROWTH

## 2019-11-03 LAB — CANCER ANTIGEN 19-9: CA 19-9: 46 U/mL — ABNORMAL HIGH (ref 0–35)

## 2019-11-05 ENCOUNTER — Telehealth: Payer: Self-pay | Admitting: *Deleted

## 2019-11-06 ENCOUNTER — Telehealth: Payer: Self-pay | Admitting: Hematology

## 2019-11-06 NOTE — Telephone Encounter (Signed)
Scheduled per los. Called and left msg. Mailed printout  °

## 2019-11-07 LAB — CULTURE, BLOOD (SINGLE)
Culture: NO GROWTH
Culture: NO GROWTH
Special Requests: ADEQUATE
Special Requests: ADEQUATE

## 2019-11-15 ENCOUNTER — Telehealth: Payer: Self-pay

## 2019-11-15 NOTE — Telephone Encounter (Signed)
Left message requesting John [name] return my call as he cancelled his appt for tomorrow.

## 2019-11-15 NOTE — Telephone Encounter (Signed)
Error

## 2019-11-15 NOTE — Telephone Encounter (Signed)
-----   Message from [facility] sent at 11/15/2019 11:59 AM EST ----- Just fyi - pt called in and wanted to cancel all appts for 3/19 - I tried to call him back to ask why - left appts as is and left message for pt to call back

## 2019-11-23 ENCOUNTER — Inpatient Hospital Stay: Payer: Medicare Other | Attending: Hematology | Admitting: Hematology

## 2019-11-23 ENCOUNTER — Inpatient Hospital Stay: Payer: Medicare Other

## 2019-11-23 DIAGNOSIS — C221 Intrahepatic bile duct carcinoma: Secondary | ICD-10-CM | POA: Diagnosis not present

## 2019-11-23 DIAGNOSIS — Z79899 Other long term (current) drug therapy: Secondary | ICD-10-CM | POA: Diagnosis not present

## 2019-11-23 DIAGNOSIS — F17290 Nicotine dependence, other tobacco product, uncomplicated: Secondary | ICD-10-CM | POA: Diagnosis not present

## 2019-11-23 DIAGNOSIS — D72819 Decreased white blood cell count, unspecified: Secondary | ICD-10-CM | POA: Diagnosis not present

## 2019-11-23 DIAGNOSIS — Z5112 Encounter for antineoplastic immunotherapy: Secondary | ICD-10-CM | POA: Diagnosis not present

## 2019-11-23 DIAGNOSIS — D696 Thrombocytopenia, unspecified: Secondary | ICD-10-CM | POA: Diagnosis not present

## 2019-11-23 DIAGNOSIS — C220 Liver cell carcinoma: Secondary | ICD-10-CM | POA: Diagnosis not present

## 2019-11-23 DIAGNOSIS — G893 Neoplasm related pain (acute) (chronic): Secondary | ICD-10-CM | POA: Diagnosis not present

## 2019-11-27 ENCOUNTER — Telehealth: Payer: Self-pay | Admitting: *Deleted

## 2019-11-27 ENCOUNTER — Encounter: Payer: Self-pay | Admitting: Pain Medicine

## 2019-11-27 ENCOUNTER — Telehealth: Payer: Self-pay

## 2019-11-27 NOTE — Telephone Encounter (Signed)
Attempted to call for pre appointment review of allergies/meds. Message left. 

## 2019-11-28 ENCOUNTER — Other Ambulatory Visit: Payer: Self-pay

## 2019-11-28 ENCOUNTER — Ambulatory Visit: Payer: Medicare Other | Attending: Pain Medicine | Admitting: Pain Medicine

## 2019-11-28 DIAGNOSIS — C220 Liver cell carcinoma: Secondary | ICD-10-CM

## 2019-11-28 DIAGNOSIS — R160 Hepatomegaly, not elsewhere classified: Secondary | ICD-10-CM

## 2019-11-28 DIAGNOSIS — K2100 Gastro-esophageal reflux disease with esophagitis, without bleeding: Secondary | ICD-10-CM

## 2019-11-28 DIAGNOSIS — K257 Chronic gastric ulcer without hemorrhage or perforation: Secondary | ICD-10-CM

## 2019-11-28 DIAGNOSIS — R1011 Right upper quadrant pain: Secondary | ICD-10-CM | POA: Diagnosis not present

## 2019-11-28 DIAGNOSIS — G893 Neoplasm related pain (acute) (chronic): Secondary | ICD-10-CM | POA: Diagnosis not present

## 2019-11-28 DIAGNOSIS — G894 Chronic pain syndrome: Secondary | ICD-10-CM | POA: Diagnosis not present

## 2019-11-28 DIAGNOSIS — G8929 Other chronic pain: Secondary | ICD-10-CM

## 2019-11-28 MED ORDER — OMEPRAZOLE 20 MG PO CPDR: 20.0000 mg | DELAYED_RELEASE_CAPSULE | Freq: Every day | ORAL | 3 refills | Status: AC

## 2019-11-28 NOTE — Patient Instructions (Signed)
Celiac Plexus Block Patient Information  Description: The celiac plexus is a group of nerves which are part of the sympathetic nervous system.  These nerves supply organs in the abdomen and pelvis.  Specific organs supplied with sensation by the celiac plexus include the stomach, liver, gallbladder, pancreas, kidneys and part of the gut.   The celiac plexus is located on both sides of the aorta at approximately the level of the first lumbar vertebral body.  The block will be performed with you lying on your abdomen with a pillow underneath.  Using direct x-ray guidance, the celiac plexus will be located on both sides of the spine.  Numbing medicine will be used to deaden the skin prior to needle insertion.  In most cases, a small amount of sedation can be given by IV prior to the numbing medicine.  Two small needles will be place near the celiac plexus and local anesthetic and steroid will be injected.  The entire block usually last about 15-25 minutes.  Conditions which may be treated by celiac plexus block:   Acute and chronic pancreatitis  Pain from liver or pancreatic cancer  Pain from Crohn's disease  Other types of abdominal or flank pain  Preparation for the injection:  1. Do not eat any solid food or dairy products within 8 hours of your appointment. 2. You may drink clear liquids up to 3 hours before appointment.  Clear liquids include water, black coffee, juice or soda.  No milk or cream please. 3. You may take your regular medication, including pain medications, with a sip of water before your appointment.  Diabetics should hold regular insulin (if taken separately) and take 1/2 normal NPH dose in the morning of the procedure.  Carry some sugar containing items with you to your appointment. 4. A driver must accompany you and be prepared to drive you home after your procedure. 5. Bring all your current medications with you. 6. An IV may be inserted and sedation may be given at the  discretion of the physician. 7. A blood pressure cuff, EKG, and other monitors will often be applied during the procedure.  Some patients may need to have extra oxygen administered for a short period. 8. You will be asked to provide medical information, including your allergies and medications, prior to the procedure.  We must know immediately if you are taking blood thinners (like Coumadin/Warfarin) or if you are allergic to IV iodine contrast (dye).  We must know if you could possible be pregnant.  Possible side-effects:   Bleeding from needle site or deeper  Infection (rarre, can require surgery)  Nerve injury (rare)  Numbness & tingling (temporary)  Collapsed lung (rare)  Spinal headache ( a headache worse with upright posture)  Light-headedness (temporary)  Pain at injection site (several days)  Decreased blood pressure (temporary)  Weakness in legs (temporary)  Seizure or other drug reaction (rare)  Call if you experience:   Fever/chills associated with headache or increased back/neck pain  Headache worsened by an upright position  New onset weakness or numbness of an extremity below the injection site.  Hives or difficulty breathing (go to the emergency room)  Inflammation or drainage at the injection site.  New onset diarrhea lasting more than 2 weeks.  New symptoms which are concerning to you  Please note:  If effective, we will often do a series of 2-3 injections spaced 3-6 weeks apart to maximally decrease your pain.  If initial series is effective, you may  be a candidate for a more permanent block of the celiac plexus. .  If you have questions, please call [phone] [facility]

## 2019-11-28 NOTE — Progress Notes (Signed)
Patient: John [name]  Service Category: E/M  Provider: Gaspar Cola, MD  DOB: 10/09/1955  DOS: 11/28/2019  Location: Office  MRN: [id number]  Setting: Ambulatory outpatient  Referring Provider: Cletis Athens, MD  Type: Established Patient  Specialty: Interventional Pain Management  PCP: Cletis Athens, MD  Location: Remote location  Delivery: TeleHealth     Virtual Encounter - Pain Management PROVIDER NOTE: Information contained herein reflects review and annotations entered in association with encounter. Interpretation of such information and data should be left to medically-trained personnel. Information provided to patient can be located elsewhere in the medical record under "Patient Instructions". Document created using STT-dictation technology, any transcriptional errors that may result from process are unintentional.    Contact & Pharmacy Preferred: [phone] Home: [phone] (home) Mobile: There is no such number on file (mobile). E-mail: No e-mail address on record  [id number] [place] [address]Phone: [phone]: [address]Phone: [phone]: [phone] [place] [id number]: [phone]: [phone]  Pre-screening  Mr. DMarcuccioffered "in-person" vs "virtual" encounter. He indicated preferring virtual for this encounter.   Reason COVID-19*  Social distancing based on CDC and AMA recommendations.   I contacted SNonie Hoyeron 11/28/2019 via telephone.      I clearly identified myself as FGaspar Cola MD. I verified that I was speaking with the correct person using two identifiers (Name: SHUSSAIN MAIMONE and date of birth:  [birth year].  Consent I sought verbal advanced consent from SNonie Hoyerfor virtual visit interactions. I informed Mr. DForseof possible security and privacy concerns, risks, and limitations associated with providing "not-in-person" medical evaluation and management services. I also informed Mr. DTylerof the availability of "in-person" appointments. Finally, I informed him that there would be a charge for the virtual visit and that he could be  personally, fully or partially, financially responsible for it. Mr. DPrimoexpressed understanding and agreed to proceed.   Historic Elements   Mr. SBRAYTEN KOMARis a 64y.o. year old, male patient evaluated today after his last contact with our practice on 11/27/2019. Mr. DWaltner has a past medical history of Abnormal MRI, lumbar spine (2015) (01/21/2016), Anxiety, Back ache (05/06/2014), Cervical pain (05/06/2014), 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[place] (06/23/2015), COPD (chronic obstructive pulmonary disease) ([place], De Quervain's tenosynovitis, left (07/07/2017), Depression, Dystonia, Epileptic disorder ([place] (06/23/2015), Extremity pain (05/06/2014), Febrile seizures ([place], Foot pain (09/11/2014), Gastric ulcer (06/23/2015), Gout, H/O neoplasm (06/23/2015), Head injury (06/23/2015), Hemorrhoids, Hepatic cirrhosis ([place] (06/23/2015), Hepatitis B, History of GI bleed, Leukopenia, Liver cancer ([place] (08/2018), Neurogenic pain (01/21/2016), Nodule of upper lobe of left lung (12/02/2017), Non-small cell cancer of left lung (HN[place] (2019), Obstructive sleep apnea (06/23/2015), Osteoarthritis of spine with radiculopathy, lumbar region (06/23/2015), Osteoarthritis of spine with radiculopathy, lumbosacral region (06/23/2015), Peripheral neuropathy (06/23/2015), Peripheral neuropathy (Alcoholic) (111/57/2620, Personal history of tobacco use, presenting hazards to health (01/09/2016), Pulse irregularity, Seizures ([place], Spinal stenosis, Splenomegaly (05/30/2013), Thrombocytopenia  (HD[place] (135/59/7416, and Uncomplicated opioid dependence (HM[place] (06/23/2015). He also  has a past surgical history that includes surgery for cervical neck fracture; Esophagogastroduodenoscopy (11/2013); Tooth Extraction (Right, 09/28/2016); and Esophagogastroduodenoscopy (egd) with propofol (N/A, 06/23/2018). Mr. DKadinghas a current  medication list which includes the following prescription(s): alprazolam, avatrombopag maleate, baclofen, cetirizine, folic acid, gabapentin, magnesium, ondansetron, oxycodone, prochlorperazine, urea, omeprazole, and [DISCONTINUED] dulcolax. He  reports that he quit smoking about 2 years ago. His smoking use included cigarettes. He started smoking about 2 years ago. He has a 17.50 pack-year smoking history. His smokeless tobacco use includes snuff. He reports that he does not drink alcohol or use drugs. Mr. Mazzoni is allergic to codeine; morphine; oxycodone; and duloxetine.   HPI  Today, he is being contacted for medication management.  According to the patient, his medication management has been taken over by Erich Montane, MD, PhD, a Medical Oncologist who sees patients at [facility] and [facility].  She apparently has recommended that he increase his use of the oxycodone from half a tablet twice daily to 1 full tablet twice daily.  He tells me that he continues to have problems with itching and the mild allergy to the oxycodone and he is concerned that increasing the dose may worsen this.  This is primarily the reason why we were giving him only half of a tablet twice daily.  In any case according to the PMP she has provided the patient with a prescription for oxycodone IR 5 mg to take up to 1 tablet p.o. 4 times daily on 10/12/2019.  He had this filled on 10/18/2019.  Today he notified us that he was getting the medication from his oncologist and did not need for [place] to continue writing any further medicines.  However, he did request refills  on his omeprazole, baclofen, and gabapentin.  Recently he picked up his last fill on the baclofen and gabapentin which should last him until 02/27/2020, at which time we will give him a call to see how he is doing them refilled those medicines.  He did not have a prescription for the Prilosec and therefore I have provided him with one today.  He indicates that his primary pain at this point is associated with his liver cancer where he is experiencing pain in the right upper quadrant of his abdomen.  Today I have reminded him that we do have treatments that could be very effective for this type of a problem.  I provided the patient with information regarding the celiac plexus block and I have informed him that should a diagnostic celiac plexus block provides him with good relief of the pain, we could do neurolytic blocks.  He understood and accepted and asked if we could provide some information about this to his oncologist.  In addition, he asked about getting a COVID-19 injection.  I informed him that since he does have cancer, it is my understanding that he should have been at the top of the list.  However he indicates that whenever he spoke to before, seem to think that this was not the case and denied him of the vaccine.  Once again, I have recommended that he talk to his oncologist about this.  Pharmacotherapy Assessment  Analgesic: Oxycodone 2.5 one every 12 hours (5 mg/day of oxycodone) MME/day:7.5 mg/day.   Monitoring: [place] PMP: PDMP reviewed during this encounter.       Pharmacotherapy: No side-effects or adverse reactions reported. Compliance: No problems identified. Effectiveness: Clinically acceptable. Plan: Refer to "POC".  UDS:  Summary  Date Value Ref Range Status  10/09/2018 FINAL  Final    Comment:    ==================================================================== TOXASSURE SELECT 13 (MW) ==================================================================== Test  Result       Flag       Units Drug Present and Declared for Prescription Verification   Oxycodone                      754          EXPECTED   ng/mg creat   Oxymorphone                    232          EXPECTED   ng/mg creat   Noroxycodone                   986          EXPECTED   ng/mg creat    Sources of oxycodone include scheduled prescription medications.    Oxymorphone and noroxycodone are expected metabolites of    oxycodone. Oxymorphone is also available as a scheduled    prescription medication. Drug Absent but Declared for Prescription Verification   Alprazolam                     Not Detected UNEXPECTED ng/mg creat ==================================================================== Test                      Result    Flag   Units      Ref Range   Creatinine              37               mg/dL      >=20 ==================================================================== Declared Medications:  The flagging and interpretation on this report are based on the  following declared medications.  Unexpected results may arise from  inaccuracies in the declared medications.  **Note: The testing scope of this panel includes these medications:  Alprazolam (Xanax)  Oxycodone  **Note: The testing scope of this panel does not include following  reported medications:  Baclofen (Lioresal)  Cetirizine (Zyrtec)  Docusate (Dulcolax)  Folic acid (Folvite)  Gabapentin  Magnesium Oxide  Omeprazole (Prilosec) ==================================================================== For clinical consultation, please call [phone]. ====================================================================    Laboratory Chemistry Profile   Renal Lab Results  Component Value Date   BUN 6 (L) 11/02/2019   CREATININE 0.70 11/02/2019   GFRAA >60 11/02/2019   GFRNONAA >60 11/02/2019    Hepatic Lab Results  Component Value Date   AST 38 11/02/2019   ALT 15 11/02/2019   ALBUMIN  3.3 (L) 11/02/2019   ALKPHOS 120 11/02/2019   HCVAB <0.1 11/06/2015    Electrolytes Lab Results  Component Value Date   NA 139 11/02/2019   K 4.0 11/02/2019   CL 106 11/02/2019   CALCIUM 8.2 (L) 11/02/2019   MG 2.0 06/18/2019    Bone No results found for: VD25OH, VD125OH2TOT, [id number], MC9470JG2, 25OHVITD1, 25OHVITD2, 25OHVITD3, TESTOFREE, TESTOSTERONE  Inflammation (CRP: Acute Phase) (ESR: Chronic Phase) Lab Results  Component Value Date   CRP 0.5 09/22/2015   ESRSEDRATE 15 09/22/2015      Note: Above Lab results reviewed.  Imaging  CT Chest W Contrast CLINICAL DATA:  Follow-up HCC  EXAM: CT CHEST, ABDOMEN, AND PELVIS WITH CONTRAST  TECHNIQUE: Multidetector CT imaging of the chest, abdomen and pelvis was performed following the standard protocol during bolus administration of intravenous contrast.  CONTRAST:  157m OMNIPAQUE IOHEXOL 300 MG/ML SOLN, additional oral enteric contrast  COMPARISON:  MR abdomen, 06/14/2019, 03/28/2019, CT chest, 06/14/2019,  11/28/2018  FINDINGS: CT CHEST FINDINGS  Cardiovascular: Aortic valve calcifications. Aortic atherosclerosis. Normal heart size. No pericardial effusion.  Mediastinum/Nodes: Significant interval decrease in size in a previously seen bulky prevascular lymph node near the brachiocephalic confluence, now measuring 1.6 x 1.1 cm, previously 2.5 x 1.8 cm when measured similarly (series 2, image 19). Thyroid gland, trachea, and esophagus demonstrate no significant findings.  Lungs/Pleura: Mild centrilobular and paraseptal emphysema. Unchanged appearance of a left apical pulmonary nodule status post radiation therapy with adjacent radiation change (series 3, image 21). No pleural effusion or pneumothorax.  Musculoskeletal: No chest wall mass or suspicious bone lesions identified.  CT ABDOMEN PELVIS FINDINGS  Hepatobiliary: Bulky, multifocal rim enhancing hepatocellular carcinoma, internally necrotic appearing, is  slightly increased in size compared to prior examination, a dominant mass in the anterior right lobe of the liver measuring 10.4 x 7.0 cm, previously 9.7 x 6.0 cm when measured similarly on prior MRI (series 2, image 58). Underlying cirrhotic morphology of the liver. No gallstones, gallbladder wall thickening, or biliary dilatation.  Pancreas: Unremarkable. No pancreatic ductal dilatation or surrounding inflammatory changes.  Spleen: Redemonstrated splenomegaly, maximum coronal span 16.3 cm.  Adrenals/Urinary Tract: Adrenal glands are unremarkable. Kidneys are normal, without renal calculi, solid lesion, or hydronephrosis. Bladder is unremarkable.  Stomach/Bowel: Stomach is within normal limits. Appendix appears normal. No evidence of bowel wall thickening, distention, or inflammatory changes.  Vascular/Lymphatic: Aortic atherosclerosis. Interval enlargement in bulky portacaval and retroperitoneal lymph nodes, largest portacaval node measuring 4.3 x 3.5 cm, previously 2.4 x 2.1 cm (series 2, image 67).  Reproductive: Mild prostatomegaly.  Other: No abdominal wall hernia or abnormality. Redemonstrated small volume ascites.  Musculoskeletal: No acute or significant osseous findings.  IMPRESSION: 1. Bulky, multifocal rim enhancing hepatocellular carcinoma, internally necrotic appearing, is slightly increased in size compared to prior examination, a dominant mass in the anterior right lobe of the liver measuring 10.4 x 7.0 cm, previously 9.7 x 6.0 cm when measured similarly on prior MRI (series 2, image 58). 2. Interval enlargement in bulky portacaval and retroperitoneal lymph nodes, largest portacaval node measuring 4.3 x 3.5 cm, previously 2.4 x 2.1 cm (series 2, image 67). 3. Significant interval decrease in size in a previously seen bulky prevascular lymph node near the brachiocephalic confluence, now measuring 1.6 x 1.1 cm, previously 2.5 x 1.8 cm when measured similarly  (series 2, image 19). 4. Above constellation of findings are consistent with worsened primary and and abdominal nodal metastatic disease but with improved thoracic adenopathy. 5. Stable appearance of left apical pulmonary nodule status post radiation therapy with adjacent radiation change. 6. Underlying cirrhotic morphology of the liver with splenomegaly and ascites. 7. Emphysema (ICD10-J43.9). 8. Aortic Atherosclerosis (ICD10-I70.0).  Electronically Signed   By: Eddie Candle M.D.   On: 09/10/2019 12:22 CT Abdomen Pelvis W Contrast CLINICAL DATA:  Follow-up HCC  EXAM: CT CHEST, ABDOMEN, AND PELVIS WITH CONTRAST  TECHNIQUE: Multidetector CT imaging of the chest, abdomen and pelvis was performed following the standard protocol during bolus administration of intravenous contrast.  CONTRAST:  1104m OMNIPAQUE IOHEXOL 300 MG/ML SOLN, additional oral enteric contrast  COMPARISON:  MR abdomen, 06/14/2019, 03/28/2019, CT chest, 06/14/2019, 11/28/2018  FINDINGS: CT CHEST FINDINGS  Cardiovascular: Aortic valve calcifications. Aortic atherosclerosis. Normal heart size. No pericardial effusion.  Mediastinum/Nodes: Significant interval decrease in size in a previously seen bulky prevascular lymph node near the brachiocephalic confluence, now measuring 1.6 x 1.1 cm, previously 2.5 x 1.8 cm when measured similarly (series 2, image 19).  Thyroid gland, trachea, and esophagus demonstrate no significant findings.  Lungs/Pleura: Mild centrilobular and paraseptal emphysema. Unchanged appearance of a left apical pulmonary nodule status post radiation therapy with adjacent radiation change (series 3, image 21). No pleural effusion or pneumothorax.  Musculoskeletal: No chest wall mass or suspicious bone lesions identified.  CT ABDOMEN PELVIS FINDINGS  Hepatobiliary: Bulky, multifocal rim enhancing hepatocellular carcinoma, internally necrotic appearing, is slightly increased in size  compared to prior examination, a dominant mass in the anterior right lobe of the liver measuring 10.4 x 7.0 cm, previously 9.7 x 6.0 cm when measured similarly on prior MRI (series 2, image 58). Underlying cirrhotic morphology of the liver. No gallstones, gallbladder wall thickening, or biliary dilatation.  Pancreas: Unremarkable. No pancreatic ductal dilatation or surrounding inflammatory changes.  Spleen: Redemonstrated splenomegaly, maximum coronal span 16.3 cm.  Adrenals/Urinary Tract: Adrenal glands are unremarkable. Kidneys are normal, without renal calculi, solid lesion, or hydronephrosis. Bladder is unremarkable.  Stomach/Bowel: Stomach is within normal limits. Appendix appears normal. No evidence of bowel wall thickening, distention, or inflammatory changes.  Vascular/Lymphatic: Aortic atherosclerosis. Interval enlargement in bulky portacaval and retroperitoneal lymph nodes, largest portacaval node measuring 4.3 x 3.5 cm, previously 2.4 x 2.1 cm (series 2, image 67).  Reproductive: Mild prostatomegaly.  Other: No abdominal wall hernia or abnormality. Redemonstrated small volume ascites.  Musculoskeletal: No acute or significant osseous findings.  IMPRESSION: 1. Bulky, multifocal rim enhancing hepatocellular carcinoma, internally necrotic appearing, is slightly increased in size compared to prior examination, a dominant mass in the anterior right lobe of the liver measuring 10.4 x 7.0 cm, previously 9.7 x 6.0 cm when measured similarly on prior MRI (series 2, image 58). 2. Interval enlargement in bulky portacaval and retroperitoneal lymph nodes, largest portacaval node measuring 4.3 x 3.5 cm, previously 2.4 x 2.1 cm (series 2, image 67). 3. Significant interval decrease in size in a previously seen bulky prevascular lymph node near the brachiocephalic confluence, now measuring 1.6 x 1.1 cm, previously 2.5 x 1.8 cm when measured similarly (series 2, image 19). 4.  Above constellation of findings are consistent with worsened primary and and abdominal nodal metastatic disease but with improved thoracic adenopathy. 5. Stable appearance of left apical pulmonary nodule status post radiation therapy with adjacent radiation change. 6. Underlying cirrhotic morphology of the liver with splenomegaly and ascites. 7. Emphysema (ICD10-J43.9). 8. Aortic Atherosclerosis (ICD10-I70.0).  Electronically Signed   By: Eddie Candle M.D.   On: 09/10/2019 12:22  Assessment  The primary encounter diagnosis was Cancer associated pain. 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  Plan of Care  Problem-specific:  No problem-specific Assessment & Plan notes found for this encounter.  Mr. STANLEY LYNESS has a current medication list which includes the following long-term medication(s): baclofen, cetirizine, gabapentin, oxycodone, prochlorperazine, omeprazole, and [DISCONTINUED] dulcolax.  Pharmacotherapy (Medications Ordered): Meds ordered this encounter  Medications  . omeprazole (PRILOSEC) 20 MG capsule    Sig: Take 1 capsule (20 mg total) by mouth daily.    Dispense:  90 capsule    Refill:  3    Fill one day early if pharmacy is closed on scheduled refill date. May substitute for generic, or similar, if available.   Orders:  No orders of the defined  types were placed in this encounter.  Follow-up plan:   Return in about 13 weeks (around 02/27/2020) for (VV), (MM).      Interventional therapies:  Considering:  Diagnostic right celiac plexus block #1  Possible therapeutic right celiac plexus neurolytic block  Diagnostic  left L5-S1 LESI  Diagnostic bilateral L4-5 TFESI  Diagnostic bilateral lumbar facet block  Diagnostic right-sided CESI    Palliative PRN treatment(s): Palliative left De Quervain's tendon injection     Recent Visits No visits were found meeting these conditions.  Showing recent visits within past 90 days and meeting all other requirements   Today's Visits Date Type Provider Dept  11/28/19 Telemedicine Milinda Pointer, MD [facility]  Showing today's visits and meeting all other requirements   Future Appointments No visits were found meeting these conditions.  Showing future appointments within next 90 days and meeting all other requirements   I discussed the assessment and treatment plan with the patient. The patient was provided an opportunity to ask questions and all were answered. The patient agreed with the plan and demonstrated an understanding of the instructions.  Patient advised to call back or seek an in-person evaluation if the symptoms or condition worsens.  Duration of encounter: 20 minutes.  Note by: Gaspar Cola, MD Date: 11/28/2019; Time: 11:24 AM

## 2019-12-10 NOTE — Progress Notes (Signed)
Pharmacist Chemotherapy Monitoring - Follow Up Assessment    I verify that I have reviewed each item in the below checklist:  . Regimen for the patient is scheduled for the appropriate day and plan matches scheduled date. Marland Kitchen Appropriate non-routine labs are ordered dependent on drug ordered. . If applicable, additional medications reviewed and ordered per protocol based on lifetime cumulative doses and/or treatment regimen.   Plan for follow-up and/or issues identified: No . I-vent associated with next due treatment: No . MD and/or nursing notified: No  John [name] 12/10/2019 3:10 PM

## 2019-12-14 ENCOUNTER — Ambulatory Visit: Payer: Medicare Other | Admitting: Hematology

## 2019-12-14 ENCOUNTER — Ambulatory Visit: Payer: Medicare Other

## 2019-12-14 ENCOUNTER — Other Ambulatory Visit: Payer: Medicare Other

## 2019-12-14 DIAGNOSIS — Z79891 Long term (current) use of opiate analgesic: Secondary | ICD-10-CM | POA: Diagnosis not present

## 2019-12-14 DIAGNOSIS — Z87891 Personal history of nicotine dependence: Secondary | ICD-10-CM | POA: Diagnosis not present

## 2019-12-14 DIAGNOSIS — Z8719 Personal history of other diseases of the digestive system: Secondary | ICD-10-CM | POA: Diagnosis not present

## 2019-12-14 DIAGNOSIS — C221 Intrahepatic bile duct carcinoma: Secondary | ICD-10-CM | POA: Diagnosis not present

## 2019-12-14 DIAGNOSIS — D72819 Decreased white blood cell count, unspecified: Secondary | ICD-10-CM | POA: Diagnosis not present

## 2019-12-14 DIAGNOSIS — G893 Neoplasm related pain (acute) (chronic): Secondary | ICD-10-CM | POA: Diagnosis not present

## 2019-12-14 DIAGNOSIS — Z5112 Encounter for antineoplastic immunotherapy: Secondary | ICD-10-CM | POA: Diagnosis not present

## 2019-12-14 DIAGNOSIS — D696 Thrombocytopenia, unspecified: Secondary | ICD-10-CM | POA: Diagnosis not present

## 2019-12-14 DIAGNOSIS — C220 Liver cell carcinoma: Secondary | ICD-10-CM | POA: Diagnosis not present

## 2020-01-04 DIAGNOSIS — C220 Liver cell carcinoma: Secondary | ICD-10-CM | POA: Diagnosis not present

## 2020-01-04 DIAGNOSIS — I81 Portal vein thrombosis: Secondary | ICD-10-CM | POA: Diagnosis not present

## 2020-01-04 DIAGNOSIS — R110 Nausea: Secondary | ICD-10-CM | POA: Diagnosis not present

## 2020-01-04 DIAGNOSIS — K7290 Hepatic failure, unspecified without coma: Secondary | ICD-10-CM | POA: Diagnosis not present

## 2020-01-04 DIAGNOSIS — Z87891 Personal history of nicotine dependence: Secondary | ICD-10-CM | POA: Diagnosis not present

## 2020-01-04 DIAGNOSIS — G893 Neoplasm related pain (acute) (chronic): Secondary | ICD-10-CM | POA: Diagnosis not present

## 2020-01-04 DIAGNOSIS — R590 Localized enlarged lymph nodes: Secondary | ICD-10-CM | POA: Diagnosis not present

## 2020-01-04 DIAGNOSIS — Z5112 Encounter for antineoplastic immunotherapy: Secondary | ICD-10-CM | POA: Diagnosis not present

## 2020-01-04 DIAGNOSIS — D696 Thrombocytopenia, unspecified: Secondary | ICD-10-CM | POA: Diagnosis not present

## 2020-01-04 DIAGNOSIS — R918 Other nonspecific abnormal finding of lung field: Secondary | ICD-10-CM | POA: Diagnosis not present

## 2020-01-04 DIAGNOSIS — J90 Pleural effusion, not elsewhere classified: Secondary | ICD-10-CM | POA: Diagnosis not present

## 2020-01-04 DIAGNOSIS — R188 Other ascites: Secondary | ICD-10-CM | POA: Diagnosis not present

## 2020-01-04 DIAGNOSIS — D72819 Decreased white blood cell count, unspecified: Secondary | ICD-10-CM | POA: Diagnosis not present

## 2020-01-04 DIAGNOSIS — C221 Intrahepatic bile duct carcinoma: Secondary | ICD-10-CM | POA: Diagnosis not present

## 2020-01-10 ENCOUNTER — Other Ambulatory Visit: Payer: Self-pay | Admitting: Hematology

## 2020-01-10 ENCOUNTER — Other Ambulatory Visit: Payer: Self-pay | Admitting: Pain Medicine

## 2020-01-10 DIAGNOSIS — K2100 Gastro-esophageal reflux disease with esophagitis, without bleeding: Secondary | ICD-10-CM

## 2020-01-10 DIAGNOSIS — K257 Chronic gastric ulcer without hemorrhage or perforation: Secondary | ICD-10-CM

## 2020-01-10 DIAGNOSIS — M792 Neuralgia and neuritis, unspecified: Secondary | ICD-10-CM

## 2020-01-23 DIAGNOSIS — C220 Liver cell carcinoma: Secondary | ICD-10-CM | POA: Diagnosis not present

## 2020-01-23 DIAGNOSIS — Z803 Family history of malignant neoplasm of breast: Secondary | ICD-10-CM | POA: Diagnosis not present

## 2020-01-23 DIAGNOSIS — Z79899 Other long term (current) drug therapy: Secondary | ICD-10-CM | POA: Diagnosis not present

## 2020-01-23 DIAGNOSIS — R911 Solitary pulmonary nodule: Secondary | ICD-10-CM | POA: Diagnosis not present

## 2020-01-23 DIAGNOSIS — D696 Thrombocytopenia, unspecified: Secondary | ICD-10-CM | POA: Diagnosis not present

## 2020-01-23 DIAGNOSIS — D72819 Decreased white blood cell count, unspecified: Secondary | ICD-10-CM | POA: Diagnosis not present

## 2020-01-23 DIAGNOSIS — R110 Nausea: Secondary | ICD-10-CM | POA: Diagnosis not present

## 2020-01-23 DIAGNOSIS — R188 Other ascites: Secondary | ICD-10-CM | POA: Diagnosis not present

## 2020-01-23 DIAGNOSIS — G893 Neoplasm related pain (acute) (chronic): Secondary | ICD-10-CM | POA: Diagnosis not present

## 2020-01-23 DIAGNOSIS — Z806 Family history of leukemia: Secondary | ICD-10-CM | POA: Diagnosis not present

## 2020-01-23 DIAGNOSIS — R0602 Shortness of breath: Secondary | ICD-10-CM | POA: Diagnosis not present

## 2020-01-23 DIAGNOSIS — Z801 Family history of malignant neoplasm of trachea, bronchus and lung: Secondary | ICD-10-CM | POA: Diagnosis not present

## 2020-01-23 DIAGNOSIS — Z5112 Encounter for antineoplastic immunotherapy: Secondary | ICD-10-CM | POA: Diagnosis not present

## 2020-01-23 DIAGNOSIS — R140 Abdominal distension (gaseous): Secondary | ICD-10-CM | POA: Diagnosis not present

## 2020-01-23 DIAGNOSIS — Z87891 Personal history of nicotine dependence: Secondary | ICD-10-CM | POA: Diagnosis not present

## 2020-01-30 DIAGNOSIS — R161 Splenomegaly, not elsewhere classified: Secondary | ICD-10-CM | POA: Diagnosis not present

## 2020-01-30 DIAGNOSIS — C220 Liver cell carcinoma: Secondary | ICD-10-CM | POA: Diagnosis not present

## 2020-01-30 DIAGNOSIS — R188 Other ascites: Secondary | ICD-10-CM | POA: Diagnosis not present

## 2020-01-30 DIAGNOSIS — K7460 Unspecified cirrhosis of liver: Secondary | ICD-10-CM | POA: Diagnosis not present

## 2020-02-13 DIAGNOSIS — D696 Thrombocytopenia, unspecified: Secondary | ICD-10-CM | POA: Diagnosis not present

## 2020-02-13 DIAGNOSIS — C221 Intrahepatic bile duct carcinoma: Secondary | ICD-10-CM | POA: Diagnosis not present

## 2020-02-13 DIAGNOSIS — Z5112 Encounter for antineoplastic immunotherapy: Secondary | ICD-10-CM | POA: Diagnosis not present

## 2020-02-13 DIAGNOSIS — D72819 Decreased white blood cell count, unspecified: Secondary | ICD-10-CM | POA: Diagnosis not present

## 2020-02-13 DIAGNOSIS — Z87891 Personal history of nicotine dependence: Secondary | ICD-10-CM | POA: Diagnosis not present

## 2020-02-13 DIAGNOSIS — R140 Abdominal distension (gaseous): Secondary | ICD-10-CM | POA: Diagnosis not present

## 2020-02-13 DIAGNOSIS — R110 Nausea: Secondary | ICD-10-CM | POA: Diagnosis not present

## 2020-02-13 DIAGNOSIS — R188 Other ascites: Secondary | ICD-10-CM | POA: Diagnosis not present

## 2020-02-13 DIAGNOSIS — C220 Liver cell carcinoma: Secondary | ICD-10-CM | POA: Diagnosis not present

## 2020-02-13 DIAGNOSIS — G893 Neoplasm related pain (acute) (chronic): Secondary | ICD-10-CM | POA: Diagnosis not present

## 2020-02-25 DIAGNOSIS — D696 Thrombocytopenia, unspecified: Secondary | ICD-10-CM | POA: Diagnosis not present

## 2020-02-25 DIAGNOSIS — C220 Liver cell carcinoma: Secondary | ICD-10-CM | POA: Diagnosis not present

## 2020-02-25 NOTE — Progress Notes (Signed)
Patient: John [name]  Service Category: E/M  Provider: Gaspar Cola, MD  DOB: [birth year]  DOS: 02/27/2020  Location: Office  MRN: [id number]  Setting: Ambulatory outpatient  Referring Provider: Cletis Athens, MD  Type: Established Patient  Specialty: Interventional Pain Management  PCP: Cletis Athens, MD  Location: Remote location  Delivery: TeleHealth     Virtual Encounter - Pain Management PROVIDER NOTE: Information contained herein reflects review and annotations entered in association with encounter. Interpretation of such information and data should be left to medically-trained personnel. Information provided to patient can be located elsewhere in the medical record under "Patient Instructions". Document created using STT-dictation technology, any transcriptional errors that may result from process are unintentional.    Contact & Pharmacy Preferred: [phone] Home: [phone] (home) Mobile: There is no such number on file (mobile). E-mail: No e-mail address on record  [id number] [place] [place]Phone: [phone]: [address] [address]Phone: [phone]: [phone] [place] [place]Phone: [phone]: [phone]  Pre-screening  Mr. DEschmannoffered "in-person" vs "virtual" encounter. He indicated preferring virtual for this encounter.   Reason COVID-19*  Social distancing based on CDC and AMA recommendations.   I contacted John Hoyeron 02/27/2020 via telephone.      I clearly identified myself as FGaspar Cola MD. I verified that I was speaking with the correct person using two identifiers (Name: John [name] and date  of birth: [birth year].  Consent I sought verbal advanced consent from John Hoyerfor virtual visit interactions. I informed Mr. DRioloof possible security and privacy concerns, risks, and limitations associated with providing "not-in-person" medical evaluation and management services. I also informed Mr. DMindelof the availability of "in-person" appointments. Finally, I informed him that there would be a charge for the virtual visit and that he could be  personally, fully or partially, financially responsible for it. John [name] understanding and agreed to proceed.   Historic Elements   Mr. STEGH FRANEKis a 64y.o. year old, male patient evaluated today after his last contact with our practice on 01/10/2020. John [name] has a past medical history of Abnormal MRI, lumbar spine (2015) (01/21/2016), Anxiety, Back ache (05/06/2014), Cervical pain (05/06/2014), 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[place] (06/23/2015), COPD (chronic obstructive pulmonary disease) ([place], De Quervain's tenosynovitis, left (07/07/2017), Depression, Dystonia, Epileptic disorder ([place] (06/23/2015), Extremity pain (05/06/2014), Febrile seizures ([place], Foot pain (09/11/2014), Gastric ulcer (06/23/2015), Gout, H/O neoplasm (06/23/2015), Head injury (06/23/2015), Hemorrhoids, Hepatic cirrhosis (HG[place] (06/23/2015), Hepatitis B, History of GI bleed, Leukopenia, Liver cancer ([place] (08/2018), Neurogenic pain (01/21/2016), Nodule of upper lobe of left lung (12/02/2017), Non-small cell cancer of left lung (HCl[place] (2019), Obstructive sleep apnea (06/23/2015), Osteoarthritis of spine with radiculopathy, lumbar region (06/23/2015), Osteoarthritis of spine with radiculopathy, lumbosacral region (06/23/2015), Peripheral neuropathy (06/23/2015), Peripheral neuropathy (Alcoholic) (157/32/2025, Personal history of tobacco use, presenting hazards to health (01/09/2016), Pulse irregularity, Seizures ([place], Spinal stenosis, Splenomegaly (05/30/2013),  Thrombocytopenia ([place] (142/70/6237, and Uncomplicated opioid dependence ([place] (06/23/2015). He also  has a past surgical history that includes surgery for cervical neck fracture; Esophagogastroduodenoscopy (11/2013); Tooth Extraction (Right, 09/28/2016); and Esophagogastroduodenoscopy (egd) with propofol (N/A, 06/23/2018). Mr. DMeckelhas  a current medication list which includes the following prescription(s): alprazolam, baclofen, cetirizine, folic acid, gabapentin, magnesium, omeprazole, ondansetron, oxycodone, prochlorperazine, and [DISCONTINUED] dulcolax. He  reports that he quit smoking about 2 years ago. His smoking use included cigarettes. He started smoking about 2 years ago. He has a 17.50 pack-year smoking history. His smokeless tobacco use includes snuff. He reports that he does not drink alcohol and does not use drugs. John [name] is allergic to codeine, morphine, oxycodone, and duloxetine.   HPI  Today, he is being contacted for medication management.  The patient indicates doing well with the current medication regimen. No adverse reactions or side effects reported to the medications.  His opioid analgesic management has been taken over by his oncologist.  The patient indicates that he has had to go up on the oxycodone IR to 5 mg, 1 to 3 tablets/day depending on his liver pain.  He also describes having thrombocytopenia and ascites.  He also indicated that although his primary cancer is in the area of the liver, they have also found some lymph nodes in the thoracic region and lumbar region suggesting the possibility of metastatic disease.  He is still active and undergoing chemotherapy.  Today I will go ahead and renew his baclofen and Neurontin and I will see him back on follow-up in 6 months.  All the problems that he is currently going through are associated primarily to his cancer and therefore I will delegate management of his condition to the oncologist.   Today was a rather lengthy encounter  because the patient wanted to update me on his cancer and what was it that they were doing.  He also mentioned that the Nexium that he was taking initially twice daily and later switched to daily, he has had to go up again to twice daily since his ascites seems to be pushing his esophagus and stomach into the thoracic region causing an increase in his gastroesophageal reflux disease.  I reminded the patient that this medication can be obtained over-the-counter and if he needed some additional medicine he could supplemented with a 20 mg pill that second time during the day.  Pharmacotherapy Assessment  Analgesic: Oxycodone IR 70m, 1 Tab PO 1-3x/day, PRN for cancer pain (5-15 mg/day of oxycodone) MME/day:7.5-22.5 mg/day.   Monitoring: [place] PMP: PDMP reviewed during this encounter.       Pharmacotherapy: No side-effects or adverse reactions reported. Compliance: No problems identified. Effectiveness: Clinically acceptable. Plan: Refer to "POC".  UDS:  Summary  Date Value Ref Range Status  10/09/2018 FINAL  Final    Comment:    ==================================================================== TOXASSURE SELECT 13 (MW) ==================================================================== Test                             Result       Flag       Units Drug Present and Declared for Prescription Verification   Oxycodone                      754          EXPECTED   ng/mg creat   Oxymorphone                    232          EXPECTED   ng/mg creat   Noroxycodone  986          EXPECTED   ng/mg creat    Sources of oxycodone include scheduled prescription medications.    Oxymorphone and noroxycodone are expected metabolites of    oxycodone. Oxymorphone is also available as a scheduled    prescription medication. Drug Absent but Declared for Prescription Verification   Alprazolam                     Not Detected UNEXPECTED ng/mg  creat ==================================================================== Test                      Result    Flag   Units      Ref Range   Creatinine              37               mg/dL      >=20 ==================================================================== Declared Medications:  The flagging and interpretation on this report are based on the  following declared medications.  Unexpected results may arise from  inaccuracies in the declared medications.  **Note: The testing scope of this panel includes these medications:  Alprazolam (Xanax)  Oxycodone  **Note: The testing scope of this panel does not include following  reported medications:  Baclofen (Lioresal)  Cetirizine (Zyrtec)  Docusate (Dulcolax)  Folic acid (Folvite)  Gabapentin  Magnesium Oxide  Omeprazole (Prilosec) ==================================================================== For clinical consultation, please call [phone]. ====================================================================     Laboratory Chemistry Profile   Renal Lab Results  Component Value Date   BUN 6 (L) 11/02/2019   CREATININE 0.70 11/02/2019   GFRAA >60 11/02/2019   GFRNONAA >60 11/02/2019     Hepatic Lab Results  Component Value Date   AST 38 11/02/2019   ALT 15 11/02/2019   ALBUMIN 3.3 (L) 11/02/2019   ALKPHOS 120 11/02/2019   HCVAB <0.1 11/06/2015     Electrolytes Lab Results  Component Value Date   NA 139 11/02/2019   K 4.0 11/02/2019   CL 106 11/02/2019   CALCIUM 8.2 (L) 11/02/2019   MG 2.0 06/18/2019     Bone No results found for: VD25OH, VD125OH2TOT, YB0175ZW2, HE5277OE4, 25OHVITD1, 25OHVITD2, 25OHVITD3, TESTOFREE, TESTOSTERONE   Inflammation (CRP: Acute Phase) (ESR: Chronic Phase) Lab Results  Component Value Date   CRP 0.5 09/22/2015   ESRSEDRATE 15 09/22/2015       Note: Above Lab results reviewed.   Imaging  CT Chest W Contrast CLINICAL DATA:  Follow-up HCC  EXAM: CT CHEST,  ABDOMEN, AND PELVIS WITH CONTRAST  TECHNIQUE: Multidetector CT imaging of the chest, abdomen and pelvis was performed following the standard protocol during bolus administration of intravenous contrast.  CONTRAST:  162m OMNIPAQUE IOHEXOL 300 MG/ML SOLN, additional oral enteric contrast  COMPARISON:  MR abdomen, 06/14/2019, 03/28/2019, CT chest, 06/14/2019, 11/28/2018  FINDINGS: CT CHEST FINDINGS  Cardiovascular: Aortic valve calcifications. Aortic atherosclerosis. Normal heart size. No pericardial effusion.  Mediastinum/Nodes: Significant interval decrease in size in a previously seen bulky prevascular lymph node near the brachiocephalic confluence, now measuring 1.6 x 1.1 cm, previously 2.5 x 1.8 cm when measured similarly (series 2, image 19). Thyroid gland, trachea, and esophagus demonstrate no significant findings.  Lungs/Pleura: Mild centrilobular and paraseptal emphysema. Unchanged appearance of a left apical pulmonary nodule status post radiation therapy with adjacent radiation change (series 3, image 21). No pleural effusion or pneumothorax.  Musculoskeletal: No chest wall mass or suspicious bone lesions identified.  CT ABDOMEN  PELVIS FINDINGS  Hepatobiliary: Bulky, multifocal rim enhancing hepatocellular carcinoma, internally necrotic appearing, is slightly increased in size compared to prior examination, a dominant mass in the anterior right lobe of the liver measuring 10.4 x 7.0 cm, previously 9.7 x 6.0 cm when measured similarly on prior MRI (series 2, image 58). Underlying cirrhotic morphology of the liver. No gallstones, gallbladder wall thickening, or biliary dilatation.  Pancreas: Unremarkable. No pancreatic ductal dilatation or surrounding inflammatory changes.  Spleen: Redemonstrated splenomegaly, maximum coronal span 16.3 cm.  Adrenals/Urinary Tract: Adrenal glands are unremarkable. Kidneys are normal, without renal calculi, solid lesion, or  hydronephrosis. Bladder is unremarkable.  Stomach/Bowel: Stomach is within normal limits. Appendix appears normal. No evidence of bowel wall thickening, distention, or inflammatory changes.  Vascular/Lymphatic: Aortic atherosclerosis. Interval enlargement in bulky portacaval and retroperitoneal lymph nodes, largest portacaval node measuring 4.3 x 3.5 cm, previously 2.4 x 2.1 cm (series 2, image 67).  Reproductive: Mild prostatomegaly.  Other: No abdominal wall hernia or abnormality. Redemonstrated small volume ascites.  Musculoskeletal: No acute or significant osseous findings.  IMPRESSION: 1. Bulky, multifocal rim enhancing hepatocellular carcinoma, internally necrotic appearing, is slightly increased in size compared to prior examination, a dominant mass in the anterior right lobe of the liver measuring 10.4 x 7.0 cm, previously 9.7 x 6.0 cm when measured similarly on prior MRI (series 2, image 58). 2. Interval enlargement in bulky portacaval and retroperitoneal lymph nodes, largest portacaval node measuring 4.3 x 3.5 cm, previously 2.4 x 2.1 cm (series 2, image 67). 3. Significant interval decrease in size in a previously seen bulky prevascular lymph node near the brachiocephalic confluence, now measuring 1.6 x 1.1 cm, previously 2.5 x 1.8 cm when measured similarly (series 2, image 19). 4. Above constellation of findings are consistent with worsened primary and and abdominal nodal metastatic disease but with improved thoracic adenopathy. 5. Stable appearance of left apical pulmonary nodule status post radiation therapy with adjacent radiation change. 6. Underlying cirrhotic morphology of the liver with splenomegaly and ascites. 7. Emphysema (ICD10-J43.9). 8. Aortic Atherosclerosis (ICD10-I70.0).  Electronically Signed   By: Eddie Candle M.D.   On: 09/10/2019 12:22 CT Abdomen Pelvis W Contrast CLINICAL DATA:  Follow-up HCC  EXAM: CT CHEST, ABDOMEN, AND PELVIS WITH  CONTRAST  TECHNIQUE: Multidetector CT imaging of the chest, abdomen and pelvis was performed following the standard protocol during bolus administration of intravenous contrast.  CONTRAST:  141m OMNIPAQUE IOHEXOL 300 MG/ML SOLN, additional oral enteric contrast  COMPARISON:  MR abdomen, 06/14/2019, 03/28/2019, CT chest, 06/14/2019, 11/28/2018  FINDINGS: CT CHEST FINDINGS  Cardiovascular: Aortic valve calcifications. Aortic atherosclerosis. Normal heart size. No pericardial effusion.  Mediastinum/Nodes: Significant interval decrease in size in a previously seen bulky prevascular lymph node near the brachiocephalic confluence, now measuring 1.6 x 1.1 cm, previously 2.5 x 1.8 cm when measured similarly (series 2, image 19). Thyroid gland, trachea, and esophagus demonstrate no significant findings.  Lungs/Pleura: Mild centrilobular and paraseptal emphysema. Unchanged appearance of a left apical pulmonary nodule status post radiation therapy with adjacent radiation change (series 3, image 21). No pleural effusion or pneumothorax.  Musculoskeletal: No chest wall mass or suspicious bone lesions identified.  CT ABDOMEN PELVIS FINDINGS  Hepatobiliary: Bulky, multifocal rim enhancing hepatocellular carcinoma, internally necrotic appearing, is slightly increased in size compared to prior examination, a dominant mass in the anterior right lobe of the liver measuring 10.4 x 7.0 cm, previously 9.7 x 6.0 cm when measured similarly on prior MRI (series 2, image 58). Underlying cirrhotic morphology  of the liver. No gallstones, gallbladder wall thickening, or biliary dilatation.  Pancreas: Unremarkable. No pancreatic ductal dilatation or surrounding inflammatory changes.  Spleen: Redemonstrated splenomegaly, maximum coronal span 16.3 cm.  Adrenals/Urinary Tract: Adrenal glands are unremarkable. Kidneys are normal, without renal calculi, solid lesion, or hydronephrosis. Bladder is  unremarkable.  Stomach/Bowel: Stomach is within normal limits. Appendix appears normal. No evidence of bowel wall thickening, distention, or inflammatory changes.  Vascular/Lymphatic: Aortic atherosclerosis. Interval enlargement in bulky portacaval and retroperitoneal lymph nodes, largest portacaval node measuring 4.3 x 3.5 cm, previously 2.4 x 2.1 cm (series 2, image 67).  Reproductive: Mild prostatomegaly.  Other: No abdominal wall hernia or abnormality. Redemonstrated small volume ascites.  Musculoskeletal: No acute or significant osseous findings.  IMPRESSION: 1. Bulky, multifocal rim enhancing hepatocellular carcinoma, internally necrotic appearing, is slightly increased in size compared to prior examination, a dominant mass in the anterior right lobe of the liver measuring 10.4 x 7.0 cm, previously 9.7 x 6.0 cm when measured similarly on prior MRI (series 2, image 58). 2. Interval enlargement in bulky portacaval and retroperitoneal lymph nodes, largest portacaval node measuring 4.3 x 3.5 cm, previously 2.4 x 2.1 cm (series 2, image 67). 3. Significant interval decrease in size in a previously seen bulky prevascular lymph node near the brachiocephalic confluence, now measuring 1.6 x 1.1 cm, previously 2.5 x 1.8 cm when measured similarly (series 2, image 19). 4. Above constellation of findings are consistent with worsened primary and and abdominal nodal metastatic disease but with improved thoracic adenopathy. 5. Stable appearance of left apical pulmonary nodule status post radiation therapy with adjacent radiation change. 6. Underlying cirrhotic morphology of the liver with splenomegaly and ascites. 7. Emphysema (ICD10-J43.9). 8. Aortic Atherosclerosis (ICD10-I70.0).  Electronically Signed   By: Eddie Candle M.D.   On: 09/10/2019 12:22  Assessment  The primary encounter diagnosis was Chronic pain syndrome. Diagnoses of Cancer associated pain, Chronic low back pain  (Primary Area of Pain) (Bilateral) (L>R), Chronic lower extremity pain (Secondary area of Pain) (Bilateral) (L>R), Chronic neck pain (Third area of Pain) (Bilateral) (R>L), Chronic upper extremity pain (Fourth area of Pain) (Bilateral) (R>L), Pharmacologic therapy, Disorder of skeletal system, Problems influencing health status, Musculoskeletal pain, and Neurogenic pain were also pertinent to this visit.  Plan of Care  Problem-specific:  No problem-specific Assessment & Plan notes found for this encounter.  John [name] has a current medication list which includes the following long-term medication(s): baclofen, cetirizine, gabapentin, omeprazole, oxycodone, prochlorperazine, and [DISCONTINUED] dulcolax.  Pharmacotherapy (Medications Ordered): Meds ordered this encounter  Medications  . baclofen (LIORESAL) 10 MG tablet    Sig: Take 1 tablet (10 mg total) by mouth 3 (three) times daily.    Dispense:  270 tablet    Refill:  1    Fill one day early if pharmacy is closed on scheduled refill date. May substitute for generic if available.  . gabapentin (NEURONTIN) 600 MG tablet    Sig: Take 1 tablet (600 mg total) by mouth 3 (three) times daily.    Dispense:  270 tablet    Refill:  1    Fill one day early if pharmacy is closed on scheduled refill date. May substitute for generic if available.   Orders:  No orders of the defined types were placed in this encounter.  Follow-up plan:   Return in about 6 months (around 08/28/2020) for (F2F), (MM).      Interventional therapies:  Considering:  Diagnostic right celiac plexus block #1  Possible therapeutic right celiac plexus neurolytic block  Diagnostic left L5-S1 LESI  Diagnostic bilateral L4-5 TFESI  Diagnostic bilateral lumbar facet block  Diagnostic right-sided CESI    Palliative PRN treatment(s): Palliative left De Quervain's tendon injection      Recent Visits No visits were found meeting these conditions. Showing recent  visits within past 90 days and meeting all other requirements Today's Visits Date Type Provider Dept  02/27/20 Telemedicine Milinda Pointer, MD [facility]  Showing today's visits and meeting all other requirements Future Appointments No visits were found meeting these conditions. Showing future appointments within next 90 days and meeting all other requirements  I discussed the assessment and treatment plan with the patient. The patient was provided an opportunity to ask questions and all were answered. The patient agreed with the plan and demonstrated an understanding of the instructions.  Patient advised to call back or seek an in-person evaluation if the symptoms or condition worsens.  Duration of encounter: 30 minutes.  Note by: Gaspar Cola, MD Date: 02/27/2020; Time: 10:34 AM

## 2020-02-26 ENCOUNTER — Telehealth: Payer: Self-pay

## 2020-02-26 DIAGNOSIS — C220 Liver cell carcinoma: Secondary | ICD-10-CM | POA: Diagnosis not present

## 2020-02-26 DIAGNOSIS — C221 Intrahepatic bile duct carcinoma: Secondary | ICD-10-CM | POA: Diagnosis not present

## 2020-02-26 DIAGNOSIS — R188 Other ascites: Secondary | ICD-10-CM | POA: Diagnosis not present

## 2020-02-26 NOTE — Telephone Encounter (Signed)
Called patient to review meds for  VV no answer, left message to call office back.

## 2020-02-27 ENCOUNTER — Telehealth: Payer: Self-pay

## 2020-02-27 ENCOUNTER — Other Ambulatory Visit: Payer: Self-pay

## 2020-02-27 ENCOUNTER — Telehealth: Payer: Self-pay | Admitting: *Deleted

## 2020-02-27 ENCOUNTER — Ambulatory Visit: Payer: Medicare Other | Attending: Pain Medicine | Admitting: Pain Medicine

## 2020-02-27 DIAGNOSIS — M79604 Pain in right leg: Secondary | ICD-10-CM

## 2020-02-27 DIAGNOSIS — M5442 Lumbago with sciatica, left side: Secondary | ICD-10-CM | POA: Diagnosis not present

## 2020-02-27 DIAGNOSIS — Z79899 Other long term (current) drug therapy: Secondary | ICD-10-CM | POA: Insufficient documentation

## 2020-02-27 DIAGNOSIS — M542 Cervicalgia: Secondary | ICD-10-CM | POA: Diagnosis not present

## 2020-02-27 DIAGNOSIS — M5441 Lumbago with sciatica, right side: Secondary | ICD-10-CM

## 2020-02-27 DIAGNOSIS — K2100 Gastro-esophageal reflux disease with esophagitis, without bleeding: Secondary | ICD-10-CM

## 2020-02-27 DIAGNOSIS — M792 Neuralgia and neuritis, unspecified: Secondary | ICD-10-CM

## 2020-02-27 DIAGNOSIS — G893 Neoplasm related pain (acute) (chronic): Secondary | ICD-10-CM | POA: Diagnosis not present

## 2020-02-27 DIAGNOSIS — Z789 Other specified health status: Secondary | ICD-10-CM

## 2020-02-27 DIAGNOSIS — M899 Disorder of bone, unspecified: Secondary | ICD-10-CM

## 2020-02-27 DIAGNOSIS — M7918 Myalgia, other site: Secondary | ICD-10-CM

## 2020-02-27 DIAGNOSIS — G894 Chronic pain syndrome: Secondary | ICD-10-CM

## 2020-02-27 DIAGNOSIS — M79601 Pain in right arm: Secondary | ICD-10-CM

## 2020-02-27 DIAGNOSIS — M79605 Pain in left leg: Secondary | ICD-10-CM

## 2020-02-27 DIAGNOSIS — G8929 Other chronic pain: Secondary | ICD-10-CM

## 2020-02-27 DIAGNOSIS — C221 Intrahepatic bile duct carcinoma: Secondary | ICD-10-CM

## 2020-02-27 MED ORDER — GABAPENTIN 600 MG PO TABS: 600.0000 mg | ORAL_TABLET | Freq: Three times a day (TID) | ORAL | 1 refills | Status: AC

## 2020-02-27 MED ORDER — BACLOFEN 10 MG PO TABS: 10.0000 mg | ORAL_TABLET | Freq: Three times a day (TID) | ORAL | 1 refills | Status: AC

## 2020-03-05 DIAGNOSIS — D72819 Decreased white blood cell count, unspecified: Secondary | ICD-10-CM | POA: Diagnosis not present

## 2020-03-05 DIAGNOSIS — R188 Other ascites: Secondary | ICD-10-CM | POA: Diagnosis not present

## 2020-03-05 DIAGNOSIS — K7460 Unspecified cirrhosis of liver: Secondary | ICD-10-CM | POA: Diagnosis not present

## 2020-03-05 DIAGNOSIS — Z87891 Personal history of nicotine dependence: Secondary | ICD-10-CM | POA: Diagnosis not present

## 2020-03-05 DIAGNOSIS — R110 Nausea: Secondary | ICD-10-CM | POA: Diagnosis not present

## 2020-03-05 DIAGNOSIS — C220 Liver cell carcinoma: Secondary | ICD-10-CM | POA: Diagnosis not present

## 2020-03-05 DIAGNOSIS — G893 Neoplasm related pain (acute) (chronic): Secondary | ICD-10-CM | POA: Diagnosis not present

## 2020-03-05 DIAGNOSIS — Z5112 Encounter for antineoplastic immunotherapy: Secondary | ICD-10-CM | POA: Diagnosis not present

## 2020-03-05 DIAGNOSIS — D696 Thrombocytopenia, unspecified: Secondary | ICD-10-CM | POA: Diagnosis not present

## 2020-03-26 DIAGNOSIS — R0602 Shortness of breath: Secondary | ICD-10-CM | POA: Diagnosis not present

## 2020-03-26 DIAGNOSIS — R5383 Other fatigue: Secondary | ICD-10-CM | POA: Diagnosis not present

## 2020-03-26 DIAGNOSIS — Z87891 Personal history of nicotine dependence: Secondary | ICD-10-CM | POA: Diagnosis not present

## 2020-03-26 DIAGNOSIS — R1011 Right upper quadrant pain: Secondary | ICD-10-CM | POA: Diagnosis not present

## 2020-03-26 DIAGNOSIS — Z5112 Encounter for antineoplastic immunotherapy: Secondary | ICD-10-CM | POA: Diagnosis not present

## 2020-03-26 DIAGNOSIS — D72819 Decreased white blood cell count, unspecified: Secondary | ICD-10-CM | POA: Diagnosis not present

## 2020-03-26 DIAGNOSIS — R110 Nausea: Secondary | ICD-10-CM | POA: Diagnosis not present

## 2020-03-26 DIAGNOSIS — D696 Thrombocytopenia, unspecified: Secondary | ICD-10-CM | POA: Diagnosis not present

## 2020-03-26 DIAGNOSIS — Z79899 Other long term (current) drug therapy: Secondary | ICD-10-CM | POA: Diagnosis not present

## 2020-03-26 DIAGNOSIS — R188 Other ascites: Secondary | ICD-10-CM | POA: Diagnosis not present

## 2020-03-26 DIAGNOSIS — C220 Liver cell carcinoma: Secondary | ICD-10-CM | POA: Diagnosis not present

## 2020-03-26 DIAGNOSIS — K5900 Constipation, unspecified: Secondary | ICD-10-CM | POA: Diagnosis not present

## 2020-03-26 DIAGNOSIS — R3912 Poor urinary stream: Secondary | ICD-10-CM | POA: Diagnosis not present

## 2020-03-26 DIAGNOSIS — R590 Localized enlarged lymph nodes: Secondary | ICD-10-CM | POA: Diagnosis not present

## 2020-03-26 DIAGNOSIS — G893 Neoplasm related pain (acute) (chronic): Secondary | ICD-10-CM | POA: Diagnosis not present

## 2020-04-02 DIAGNOSIS — R188 Other ascites: Secondary | ICD-10-CM | POA: Diagnosis not present

## 2020-04-02 DIAGNOSIS — C220 Liver cell carcinoma: Secondary | ICD-10-CM | POA: Diagnosis not present

## 2020-04-16 DIAGNOSIS — K7460 Unspecified cirrhosis of liver: Secondary | ICD-10-CM | POA: Diagnosis not present

## 2020-04-16 DIAGNOSIS — R188 Other ascites: Secondary | ICD-10-CM | POA: Diagnosis not present

## 2020-04-16 DIAGNOSIS — C220 Liver cell carcinoma: Secondary | ICD-10-CM | POA: Diagnosis not present

## 2020-04-16 DIAGNOSIS — C221 Intrahepatic bile duct carcinoma: Secondary | ICD-10-CM | POA: Diagnosis not present

## 2020-04-18 DIAGNOSIS — Z79899 Other long term (current) drug therapy: Secondary | ICD-10-CM | POA: Diagnosis not present

## 2020-04-18 DIAGNOSIS — C220 Liver cell carcinoma: Secondary | ICD-10-CM | POA: Diagnosis not present

## 2020-04-18 DIAGNOSIS — K7460 Unspecified cirrhosis of liver: Secondary | ICD-10-CM | POA: Diagnosis not present

## 2020-04-18 DIAGNOSIS — R188 Other ascites: Secondary | ICD-10-CM | POA: Diagnosis not present

## 2020-04-25 DIAGNOSIS — J90 Pleural effusion, not elsewhere classified: Secondary | ICD-10-CM | POA: Diagnosis not present

## 2020-04-25 DIAGNOSIS — C220 Liver cell carcinoma: Secondary | ICD-10-CM | POA: Diagnosis not present

## 2020-04-25 DIAGNOSIS — R590 Localized enlarged lymph nodes: Secondary | ICD-10-CM | POA: Diagnosis not present

## 2020-04-25 DIAGNOSIS — R109 Unspecified abdominal pain: Secondary | ICD-10-CM | POA: Diagnosis not present

## 2020-04-25 DIAGNOSIS — I81 Portal vein thrombosis: Secondary | ICD-10-CM | POA: Diagnosis not present

## 2020-04-25 DIAGNOSIS — R911 Solitary pulmonary nodule: Secondary | ICD-10-CM | POA: Diagnosis not present

## 2020-04-26 DIAGNOSIS — R1084 Generalized abdominal pain: Secondary | ICD-10-CM | POA: Diagnosis not present

## 2020-04-26 DIAGNOSIS — D72819 Decreased white blood cell count, unspecified: Secondary | ICD-10-CM | POA: Diagnosis not present

## 2020-04-26 DIAGNOSIS — I81 Portal vein thrombosis: Secondary | ICD-10-CM | POA: Diagnosis not present

## 2020-04-26 DIAGNOSIS — R590 Localized enlarged lymph nodes: Secondary | ICD-10-CM | POA: Diagnosis not present

## 2020-04-26 DIAGNOSIS — L03311 Cellulitis of abdominal wall: Secondary | ICD-10-CM | POA: Diagnosis not present

## 2020-04-26 DIAGNOSIS — J90 Pleural effusion, not elsewhere classified: Secondary | ICD-10-CM | POA: Diagnosis not present

## 2020-04-26 DIAGNOSIS — Z923 Personal history of irradiation: Secondary | ICD-10-CM | POA: Diagnosis not present

## 2020-04-26 DIAGNOSIS — Z8711 Personal history of peptic ulcer disease: Secondary | ICD-10-CM | POA: Diagnosis not present

## 2020-04-26 DIAGNOSIS — Z885 Allergy status to narcotic agent status: Secondary | ICD-10-CM | POA: Diagnosis not present

## 2020-04-26 DIAGNOSIS — R911 Solitary pulmonary nodule: Secondary | ICD-10-CM | POA: Diagnosis not present

## 2020-04-26 DIAGNOSIS — Z86011 Personal history of benign neoplasm of the brain: Secondary | ICD-10-CM | POA: Diagnosis not present

## 2020-04-26 DIAGNOSIS — Z20822 Contact with and (suspected) exposure to covid-19: Secondary | ICD-10-CM | POA: Diagnosis not present

## 2020-04-26 DIAGNOSIS — Z85118 Personal history of other malignant neoplasm of bronchus and lung: Secondary | ICD-10-CM | POA: Diagnosis not present

## 2020-04-26 DIAGNOSIS — G4730 Sleep apnea, unspecified: Secondary | ICD-10-CM | POA: Diagnosis not present

## 2020-04-26 DIAGNOSIS — R748 Abnormal levels of other serum enzymes: Secondary | ICD-10-CM | POA: Diagnosis not present

## 2020-04-26 DIAGNOSIS — Z888 Allergy status to other drugs, medicaments and biological substances status: Secondary | ICD-10-CM | POA: Diagnosis not present

## 2020-04-26 DIAGNOSIS — K7460 Unspecified cirrhosis of liver: Secondary | ICD-10-CM | POA: Diagnosis not present

## 2020-04-26 DIAGNOSIS — D696 Thrombocytopenia, unspecified: Secondary | ICD-10-CM | POA: Diagnosis not present

## 2020-04-26 DIAGNOSIS — R251 Tremor, unspecified: Secondary | ICD-10-CM | POA: Diagnosis not present

## 2020-04-26 DIAGNOSIS — J449 Chronic obstructive pulmonary disease, unspecified: Secondary | ICD-10-CM | POA: Diagnosis not present

## 2020-04-26 DIAGNOSIS — R140 Abdominal distension (gaseous): Secondary | ICD-10-CM | POA: Diagnosis not present

## 2020-04-26 DIAGNOSIS — R188 Other ascites: Secondary | ICD-10-CM | POA: Diagnosis not present

## 2020-04-26 DIAGNOSIS — D649 Anemia, unspecified: Secondary | ICD-10-CM | POA: Diagnosis not present

## 2020-04-26 DIAGNOSIS — R569 Unspecified convulsions: Secondary | ICD-10-CM | POA: Diagnosis not present

## 2020-04-26 DIAGNOSIS — Z8619 Personal history of other infectious and parasitic diseases: Secondary | ICD-10-CM | POA: Diagnosis not present

## 2020-04-26 DIAGNOSIS — R262 Difficulty in walking, not elsewhere classified: Secondary | ICD-10-CM | POA: Diagnosis not present

## 2020-04-26 DIAGNOSIS — C220 Liver cell carcinoma: Secondary | ICD-10-CM | POA: Diagnosis not present

## 2020-04-26 DIAGNOSIS — F17290 Nicotine dependence, other tobacco product, uncomplicated: Secondary | ICD-10-CM | POA: Diagnosis not present

## 2020-05-07 DIAGNOSIS — F17290 Nicotine dependence, other tobacco product, uncomplicated: Secondary | ICD-10-CM | POA: Diagnosis not present

## 2020-05-07 DIAGNOSIS — R188 Other ascites: Secondary | ICD-10-CM | POA: Diagnosis not present

## 2020-05-07 DIAGNOSIS — C221 Intrahepatic bile duct carcinoma: Secondary | ICD-10-CM | POA: Diagnosis not present

## 2020-05-07 DIAGNOSIS — C220 Liver cell carcinoma: Secondary | ICD-10-CM | POA: Diagnosis not present

## 2020-05-07 DIAGNOSIS — R109 Unspecified abdominal pain: Secondary | ICD-10-CM | POA: Diagnosis not present

## 2020-05-07 DIAGNOSIS — D696 Thrombocytopenia, unspecified: Secondary | ICD-10-CM | POA: Diagnosis not present

## 2020-05-07 DIAGNOSIS — Z5112 Encounter for antineoplastic immunotherapy: Secondary | ICD-10-CM | POA: Diagnosis not present

## 2020-05-14 DIAGNOSIS — Z79899 Other long term (current) drug therapy: Secondary | ICD-10-CM | POA: Diagnosis not present

## 2020-05-14 DIAGNOSIS — R188 Other ascites: Secondary | ICD-10-CM | POA: Diagnosis not present

## 2020-05-14 DIAGNOSIS — C220 Liver cell carcinoma: Secondary | ICD-10-CM | POA: Diagnosis not present

## 2020-05-14 DIAGNOSIS — C221 Intrahepatic bile duct carcinoma: Secondary | ICD-10-CM | POA: Diagnosis not present

## 2020-05-15 DIAGNOSIS — R188 Other ascites: Secondary | ICD-10-CM | POA: Diagnosis not present

## 2020-05-15 DIAGNOSIS — C220 Liver cell carcinoma: Secondary | ICD-10-CM | POA: Diagnosis not present

## 2020-05-16 DIAGNOSIS — C220 Liver cell carcinoma: Secondary | ICD-10-CM | POA: Diagnosis not present

## 2020-05-16 DIAGNOSIS — J449 Chronic obstructive pulmonary disease, unspecified: Secondary | ICD-10-CM | POA: Diagnosis not present

## 2020-05-16 DIAGNOSIS — D649 Anemia, unspecified: Secondary | ICD-10-CM | POA: Diagnosis not present

## 2020-05-16 DIAGNOSIS — G4730 Sleep apnea, unspecified: Secondary | ICD-10-CM | POA: Diagnosis not present

## 2020-05-16 DIAGNOSIS — M4727 Other spondylosis with radiculopathy, lumbosacral region: Secondary | ICD-10-CM | POA: Diagnosis not present

## 2020-05-16 DIAGNOSIS — F17290 Nicotine dependence, other tobacco product, uncomplicated: Secondary | ICD-10-CM | POA: Diagnosis not present

## 2020-05-16 DIAGNOSIS — D696 Thrombocytopenia, unspecified: Secondary | ICD-10-CM | POA: Diagnosis not present

## 2020-05-16 DIAGNOSIS — G249 Dystonia, unspecified: Secondary | ICD-10-CM | POA: Diagnosis not present

## 2020-05-16 DIAGNOSIS — R188 Other ascites: Secondary | ICD-10-CM | POA: Diagnosis not present

## 2020-05-16 DIAGNOSIS — Z4682 Encounter for fitting and adjustment of non-vascular catheter: Secondary | ICD-10-CM | POA: Diagnosis not present

## 2020-05-18 DIAGNOSIS — J449 Chronic obstructive pulmonary disease, unspecified: Secondary | ICD-10-CM | POA: Diagnosis not present

## 2020-05-18 DIAGNOSIS — R188 Other ascites: Secondary | ICD-10-CM | POA: Diagnosis not present

## 2020-05-18 DIAGNOSIS — D696 Thrombocytopenia, unspecified: Secondary | ICD-10-CM | POA: Diagnosis not present

## 2020-05-18 DIAGNOSIS — D649 Anemia, unspecified: Secondary | ICD-10-CM | POA: Diagnosis not present

## 2020-05-18 DIAGNOSIS — M4727 Other spondylosis with radiculopathy, lumbosacral region: Secondary | ICD-10-CM | POA: Diagnosis not present

## 2020-05-18 DIAGNOSIS — G4730 Sleep apnea, unspecified: Secondary | ICD-10-CM | POA: Diagnosis not present

## 2020-05-18 DIAGNOSIS — G249 Dystonia, unspecified: Secondary | ICD-10-CM | POA: Diagnosis not present

## 2020-05-18 DIAGNOSIS — F17290 Nicotine dependence, other tobacco product, uncomplicated: Secondary | ICD-10-CM | POA: Diagnosis not present

## 2020-05-18 DIAGNOSIS — Z4682 Encounter for fitting and adjustment of non-vascular catheter: Secondary | ICD-10-CM | POA: Diagnosis not present

## 2020-05-18 DIAGNOSIS — C220 Liver cell carcinoma: Secondary | ICD-10-CM | POA: Diagnosis not present

## 2020-05-20 DIAGNOSIS — Z4682 Encounter for fitting and adjustment of non-vascular catheter: Secondary | ICD-10-CM | POA: Diagnosis not present

## 2020-05-20 DIAGNOSIS — M4727 Other spondylosis with radiculopathy, lumbosacral region: Secondary | ICD-10-CM | POA: Diagnosis not present

## 2020-05-20 DIAGNOSIS — D696 Thrombocytopenia, unspecified: Secondary | ICD-10-CM | POA: Diagnosis not present

## 2020-05-20 DIAGNOSIS — R188 Other ascites: Secondary | ICD-10-CM | POA: Diagnosis not present

## 2020-05-20 DIAGNOSIS — C220 Liver cell carcinoma: Secondary | ICD-10-CM | POA: Diagnosis not present

## 2020-05-20 DIAGNOSIS — D649 Anemia, unspecified: Secondary | ICD-10-CM | POA: Diagnosis not present

## 2020-05-20 DIAGNOSIS — G249 Dystonia, unspecified: Secondary | ICD-10-CM | POA: Diagnosis not present

## 2020-05-20 DIAGNOSIS — J449 Chronic obstructive pulmonary disease, unspecified: Secondary | ICD-10-CM | POA: Diagnosis not present

## 2020-05-20 DIAGNOSIS — G4730 Sleep apnea, unspecified: Secondary | ICD-10-CM | POA: Diagnosis not present

## 2020-05-20 DIAGNOSIS — F17290 Nicotine dependence, other tobacco product, uncomplicated: Secondary | ICD-10-CM | POA: Diagnosis not present

## 2020-05-21 DIAGNOSIS — J449 Chronic obstructive pulmonary disease, unspecified: Secondary | ICD-10-CM | POA: Diagnosis not present

## 2020-05-21 DIAGNOSIS — R188 Other ascites: Secondary | ICD-10-CM | POA: Diagnosis not present

## 2020-05-21 DIAGNOSIS — D649 Anemia, unspecified: Secondary | ICD-10-CM | POA: Diagnosis not present

## 2020-05-21 DIAGNOSIS — G249 Dystonia, unspecified: Secondary | ICD-10-CM | POA: Diagnosis not present

## 2020-05-21 DIAGNOSIS — Z4682 Encounter for fitting and adjustment of non-vascular catheter: Secondary | ICD-10-CM | POA: Diagnosis not present

## 2020-05-21 DIAGNOSIS — M4727 Other spondylosis with radiculopathy, lumbosacral region: Secondary | ICD-10-CM | POA: Diagnosis not present

## 2020-05-21 DIAGNOSIS — G4730 Sleep apnea, unspecified: Secondary | ICD-10-CM | POA: Diagnosis not present

## 2020-05-21 DIAGNOSIS — F17290 Nicotine dependence, other tobacco product, uncomplicated: Secondary | ICD-10-CM | POA: Diagnosis not present

## 2020-05-21 DIAGNOSIS — C220 Liver cell carcinoma: Secondary | ICD-10-CM | POA: Diagnosis not present

## 2020-05-21 DIAGNOSIS — D696 Thrombocytopenia, unspecified: Secondary | ICD-10-CM | POA: Diagnosis not present

## 2020-05-23 DIAGNOSIS — M4727 Other spondylosis with radiculopathy, lumbosacral region: Secondary | ICD-10-CM | POA: Diagnosis not present

## 2020-05-23 DIAGNOSIS — C220 Liver cell carcinoma: Secondary | ICD-10-CM | POA: Diagnosis not present

## 2020-05-23 DIAGNOSIS — D696 Thrombocytopenia, unspecified: Secondary | ICD-10-CM | POA: Diagnosis not present

## 2020-05-23 DIAGNOSIS — R188 Other ascites: Secondary | ICD-10-CM | POA: Diagnosis not present

## 2020-05-23 DIAGNOSIS — G4730 Sleep apnea, unspecified: Secondary | ICD-10-CM | POA: Diagnosis not present

## 2020-05-23 DIAGNOSIS — F17290 Nicotine dependence, other tobacco product, uncomplicated: Secondary | ICD-10-CM | POA: Diagnosis not present

## 2020-05-23 DIAGNOSIS — J449 Chronic obstructive pulmonary disease, unspecified: Secondary | ICD-10-CM | POA: Diagnosis not present

## 2020-05-23 DIAGNOSIS — Z4682 Encounter for fitting and adjustment of non-vascular catheter: Secondary | ICD-10-CM | POA: Diagnosis not present

## 2020-05-23 DIAGNOSIS — D649 Anemia, unspecified: Secondary | ICD-10-CM | POA: Diagnosis not present

## 2020-05-23 DIAGNOSIS — G249 Dystonia, unspecified: Secondary | ICD-10-CM | POA: Diagnosis not present

## 2020-05-25 ENCOUNTER — Other Ambulatory Visit: Payer: Self-pay

## 2020-05-25 ENCOUNTER — Encounter: Payer: Self-pay | Admitting: Emergency Medicine

## 2020-05-25 ENCOUNTER — Emergency Department: Payer: Medicare Other

## 2020-05-25 ENCOUNTER — Inpatient Hospital Stay
Admission: EM | Admit: 2020-05-25 | Discharge: 2020-07-07 | DRG: 371 | Disposition: E | Payer: Medicare Other | Attending: Internal Medicine | Admitting: Internal Medicine

## 2020-05-25 DIAGNOSIS — F419 Anxiety disorder, unspecified: Secondary | ICD-10-CM | POA: Diagnosis present

## 2020-05-25 DIAGNOSIS — F329 Major depressive disorder, single episode, unspecified: Secondary | ICD-10-CM | POA: Diagnosis present

## 2020-05-25 DIAGNOSIS — K652 Spontaneous bacterial peritonitis: Secondary | ICD-10-CM | POA: Diagnosis not present

## 2020-05-25 DIAGNOSIS — R0902 Hypoxemia: Secondary | ICD-10-CM | POA: Diagnosis not present

## 2020-05-25 DIAGNOSIS — K860 Alcohol-induced chronic pancreatitis: Secondary | ICD-10-CM | POA: Diagnosis present

## 2020-05-25 DIAGNOSIS — N179 Acute kidney failure, unspecified: Secondary | ICD-10-CM | POA: Diagnosis present

## 2020-05-25 DIAGNOSIS — Z8042 Family history of malignant neoplasm of prostate: Secondary | ICD-10-CM

## 2020-05-25 DIAGNOSIS — R161 Splenomegaly, not elsewhere classified: Secondary | ICD-10-CM | POA: Diagnosis not present

## 2020-05-25 DIAGNOSIS — G621 Alcoholic polyneuropathy: Secondary | ICD-10-CM | POA: Diagnosis present

## 2020-05-25 DIAGNOSIS — I81 Portal vein thrombosis: Secondary | ICD-10-CM | POA: Diagnosis not present

## 2020-05-25 DIAGNOSIS — R05 Cough: Secondary | ICD-10-CM | POA: Diagnosis not present

## 2020-05-25 DIAGNOSIS — K7682 Hepatic encephalopathy: Secondary | ICD-10-CM

## 2020-05-25 DIAGNOSIS — Z8249 Family history of ischemic heart disease and other diseases of the circulatory system: Secondary | ICD-10-CM

## 2020-05-25 DIAGNOSIS — K219 Gastro-esophageal reflux disease without esophagitis: Secondary | ICD-10-CM | POA: Diagnosis not present

## 2020-05-25 DIAGNOSIS — M109 Gout, unspecified: Secondary | ICD-10-CM | POA: Diagnosis not present

## 2020-05-25 DIAGNOSIS — L03311 Cellulitis of abdominal wall: Secondary | ICD-10-CM | POA: Diagnosis not present

## 2020-05-25 DIAGNOSIS — R109 Unspecified abdominal pain: Secondary | ICD-10-CM | POA: Diagnosis not present

## 2020-05-25 DIAGNOSIS — R1084 Generalized abdominal pain: Secondary | ICD-10-CM | POA: Diagnosis not present

## 2020-05-25 DIAGNOSIS — J449 Chronic obstructive pulmonary disease, unspecified: Secondary | ICD-10-CM | POA: Diagnosis not present

## 2020-05-25 DIAGNOSIS — E871 Hypo-osmolality and hyponatremia: Secondary | ICD-10-CM | POA: Diagnosis present

## 2020-05-25 DIAGNOSIS — F32A Depression, unspecified: Secondary | ICD-10-CM | POA: Diagnosis present

## 2020-05-25 DIAGNOSIS — I469 Cardiac arrest, cause unspecified: Secondary | ICD-10-CM | POA: Diagnosis not present

## 2020-05-25 DIAGNOSIS — R188 Other ascites: Secondary | ICD-10-CM | POA: Diagnosis present

## 2020-05-25 DIAGNOSIS — Z801 Family history of malignant neoplasm of trachea, bronchus and lung: Secondary | ICD-10-CM

## 2020-05-25 DIAGNOSIS — C220 Liver cell carcinoma: Secondary | ICD-10-CM

## 2020-05-25 DIAGNOSIS — E875 Hyperkalemia: Secondary | ICD-10-CM | POA: Diagnosis not present

## 2020-05-25 DIAGNOSIS — K766 Portal hypertension: Secondary | ICD-10-CM | POA: Diagnosis present

## 2020-05-25 DIAGNOSIS — Z20822 Contact with and (suspected) exposure to covid-19: Secondary | ICD-10-CM | POA: Diagnosis present

## 2020-05-25 DIAGNOSIS — G4733 Obstructive sleep apnea (adult) (pediatric): Secondary | ICD-10-CM | POA: Diagnosis present

## 2020-05-25 DIAGNOSIS — D696 Thrombocytopenia, unspecified: Secondary | ICD-10-CM | POA: Diagnosis not present

## 2020-05-25 DIAGNOSIS — G40909 Epilepsy, unspecified, not intractable, without status epilepticus: Secondary | ICD-10-CM

## 2020-05-25 DIAGNOSIS — I10 Essential (primary) hypertension: Secondary | ICD-10-CM | POA: Diagnosis present

## 2020-05-25 DIAGNOSIS — L8990 Pressure ulcer of unspecified site, unspecified stage: Secondary | ICD-10-CM | POA: Insufficient documentation

## 2020-05-25 DIAGNOSIS — Z85118 Personal history of other malignant neoplasm of bronchus and lung: Secondary | ICD-10-CM

## 2020-05-25 DIAGNOSIS — K7460 Unspecified cirrhosis of liver: Secondary | ICD-10-CM | POA: Diagnosis not present

## 2020-05-25 DIAGNOSIS — Z66 Do not resuscitate: Secondary | ICD-10-CM | POA: Diagnosis not present

## 2020-05-25 DIAGNOSIS — L89152 Pressure ulcer of sacral region, stage 2: Secondary | ICD-10-CM

## 2020-05-25 DIAGNOSIS — Z823 Family history of stroke: Secondary | ICD-10-CM

## 2020-05-25 DIAGNOSIS — C221 Intrahepatic bile duct carcinoma: Secondary | ICD-10-CM | POA: Diagnosis not present

## 2020-05-25 DIAGNOSIS — Z743 Need for continuous supervision: Secondary | ICD-10-CM | POA: Diagnosis not present

## 2020-05-25 DIAGNOSIS — G893 Neoplasm related pain (acute) (chronic): Secondary | ICD-10-CM | POA: Diagnosis present

## 2020-05-25 DIAGNOSIS — Z885 Allergy status to narcotic agent status: Secondary | ICD-10-CM

## 2020-05-25 DIAGNOSIS — E43 Unspecified severe protein-calorie malnutrition: Secondary | ICD-10-CM | POA: Diagnosis not present

## 2020-05-25 DIAGNOSIS — K622 Anal prolapse: Secondary | ICD-10-CM | POA: Diagnosis not present

## 2020-05-25 DIAGNOSIS — Z515 Encounter for palliative care: Secondary | ICD-10-CM | POA: Diagnosis not present

## 2020-05-25 DIAGNOSIS — K7200 Acute and subacute hepatic failure without coma: Secondary | ICD-10-CM | POA: Diagnosis not present

## 2020-05-25 DIAGNOSIS — R079 Chest pain, unspecified: Secondary | ICD-10-CM | POA: Diagnosis not present

## 2020-05-25 DIAGNOSIS — Z888 Allergy status to other drugs, medicaments and biological substances status: Secondary | ICD-10-CM

## 2020-05-25 DIAGNOSIS — Z803 Family history of malignant neoplasm of breast: Secondary | ICD-10-CM

## 2020-05-25 DIAGNOSIS — R6889 Other general symptoms and signs: Secondary | ICD-10-CM | POA: Diagnosis not present

## 2020-05-25 DIAGNOSIS — Z7189 Other specified counseling: Secondary | ICD-10-CM | POA: Diagnosis not present

## 2020-05-25 DIAGNOSIS — D6959 Other secondary thrombocytopenia: Secondary | ICD-10-CM | POA: Diagnosis present

## 2020-05-25 DIAGNOSIS — Z87891 Personal history of nicotine dependence: Secondary | ICD-10-CM

## 2020-05-25 DIAGNOSIS — R072 Precordial pain: Secondary | ICD-10-CM | POA: Diagnosis not present

## 2020-05-25 DIAGNOSIS — Z923 Personal history of irradiation: Secondary | ICD-10-CM

## 2020-05-25 DIAGNOSIS — K7031 Alcoholic cirrhosis of liver with ascites: Secondary | ICD-10-CM | POA: Diagnosis not present

## 2020-05-25 DIAGNOSIS — Z6821 Body mass index (BMI) 21.0-21.9, adult: Secondary | ICD-10-CM

## 2020-05-25 DIAGNOSIS — K649 Unspecified hemorrhoids: Secondary | ICD-10-CM | POA: Diagnosis not present

## 2020-05-25 LAB — CBC WITH DIFFERENTIAL/PLATELET
Abs Immature Granulocytes: 0.01 10*3/uL (ref 0.00–0.07)
Basophils Absolute: 0 10*3/uL (ref 0.0–0.1)
Basophils Relative: 0 %
Eosinophils Absolute: 0.2 10*3/uL (ref 0.0–0.5)
Eosinophils Relative: 4 %
HCT: 40.8 % (ref 39.0–52.0)
Hemoglobin: 14 g/dL (ref 13.0–17.0)
Immature Granulocytes: 0 %
Lymphocytes Relative: 10 %
Lymphs Abs: 0.4 10*3/uL — ABNORMAL LOW (ref 0.7–4.0)
MCH: 31.5 pg (ref 26.0–34.0)
MCHC: 34.3 g/dL (ref 30.0–36.0)
MCV: 91.9 fL (ref 80.0–100.0)
Monocytes Absolute: 0.7 10*3/uL (ref 0.1–1.0)
Monocytes Relative: 15 %
Neutro Abs: 3.3 10*3/uL (ref 1.7–7.7)
Neutrophils Relative %: 71 %
Platelets: 19 10*3/uL — CL (ref 150–400)
RBC: 4.44 MIL/uL (ref 4.22–5.81)
RDW: 18.9 % — ABNORMAL HIGH (ref 11.5–15.5)
WBC: 4.6 10*3/uL (ref 4.0–10.5)
nRBC: 0 % (ref 0.0–0.2)

## 2020-05-25 LAB — BODY FLUID CELL COUNT WITH DIFFERENTIAL
Eos, Fluid: 0 %
Lymphs, Fluid: 11 %
Monocyte-Macrophage-Serous Fluid: 2 %
Neutrophil Count, Fluid: 87 %
Other Cells, Fluid: 0 %
Total Nucleated Cell Count, Fluid: 894 cu mm

## 2020-05-25 LAB — COMPREHENSIVE METABOLIC PANEL
ALT: 20 U/L (ref 0–44)
AST: 80 U/L — ABNORMAL HIGH (ref 15–41)
Albumin: 2.7 g/dL — ABNORMAL LOW (ref 3.5–5.0)
Alkaline Phosphatase: 119 U/L (ref 38–126)
Anion gap: 10 (ref 5–15)
BUN: 39 mg/dL — ABNORMAL HIGH (ref 8–23)
CO2: 29 mmol/L (ref 22–32)
Calcium: 8.5 mg/dL — ABNORMAL LOW (ref 8.9–10.3)
Chloride: 91 mmol/L — ABNORMAL LOW (ref 98–111)
Creatinine, Ser: 1.9 mg/dL — ABNORMAL HIGH (ref 0.61–1.24)
GFR calc Af Amer: 43 mL/min — ABNORMAL LOW (ref >60)
GFR calc non Af Amer: 37 mL/min — ABNORMAL LOW (ref >60)
Glucose, Bld: 112 mg/dL — ABNORMAL HIGH (ref 70–99)
Potassium: 4.4 mmol/L (ref 3.5–5.1)
Sodium: 130 mmol/L — ABNORMAL LOW (ref 135–145)
Total Bilirubin: 3.1 mg/dL — ABNORMAL HIGH (ref 0.3–1.2)
Total Protein: 6.4 g/dL — ABNORMAL LOW (ref 6.5–8.1)

## 2020-05-25 LAB — PROTEIN, PLEURAL OR PERITONEAL FLUID: Total protein, fluid: <3 g/dL

## 2020-05-25 LAB — AMMONIA: Ammonia: 25 umol/L (ref 9–35)

## 2020-05-25 LAB — SARS CORONAVIRUS 2 BY RT PCR (HOSPITAL ORDER, PERFORMED IN ~~LOC~~ HOSPITAL LAB): SARS Coronavirus 2: NEGATIVE

## 2020-05-25 LAB — LACTIC ACID, PLASMA
Lactic Acid, Venous: 1.9 mmol/L (ref 0.5–1.9)
Lactic Acid, Venous: 1.8 mmol/L (ref 0.5–1.9)

## 2020-05-25 LAB — LIPASE, BLOOD: Lipase: 44 U/L (ref 11–51)

## 2020-05-25 LAB — ALBUMIN, PLEURAL OR PERITONEAL FLUID: Albumin, Fluid: <1 g/dL

## 2020-05-25 LAB — TROPONIN I (HIGH SENSITIVITY)
Troponin I (High Sensitivity): 14 ng/L (ref <18)
Troponin I (High Sensitivity): 13 ng/L (ref <18)

## 2020-05-25 MED ORDER — ONDANSETRON HCL 4 MG/2ML IJ SOLN
4.0000 mg | Freq: Once | INTRAMUSCULAR | Status: AC
  Administered 2020-05-25: 4 mg via INTRAVENOUS
  Filled 2020-05-25: qty 2

## 2020-05-25 MED ORDER — MAGNESIUM OXIDE 400 (241.3 MG) MG PO TABS
400.0000 mg | ORAL_TABLET | Freq: Every day | ORAL | Status: DC
  Administered 2020-05-25 – 2020-06-20 (×24): 400 mg via ORAL
  Filled 2020-05-25 (×29): qty 1

## 2020-05-25 MED ORDER — LORATADINE 10 MG PO TABS
10.0000 mg | ORAL_TABLET | Freq: Every day | ORAL | Status: DC
  Administered 2020-05-25 – 2020-06-21 (×26): 10 mg via ORAL
  Filled 2020-05-25 (×28): qty 1

## 2020-05-25 MED ORDER — SODIUM CHLORIDE 0.9% FLUSH
3.0000 mL | Freq: Two times a day (BID) | INTRAVENOUS | Status: DC
  Administered 2020-05-25 – 2020-06-21 (×52): 3 mL via INTRAVENOUS

## 2020-05-25 MED ORDER — SODIUM CHLORIDE 0.9 % IV SOLN
INTRAVENOUS | Status: DC | PRN
  Administered 2020-05-25: 250 mL via INTRAVENOUS

## 2020-05-25 MED ORDER — SODIUM CHLORIDE 0.9 % IV SOLN
2.0000 g | Freq: Once | INTRAVENOUS | Status: AC
  Administered 2020-05-25: 2 g via INTRAVENOUS
  Filled 2020-05-25: qty 20

## 2020-05-25 MED ORDER — LACTATED RINGERS IV BOLUS
1000.0000 mL | Freq: Once | INTRAVENOUS | Status: AC
  Administered 2020-05-25: 1000 mL via INTRAVENOUS

## 2020-05-25 MED ORDER — ALBUMIN HUMAN 25 % IV SOLN
25.0000 g | Freq: Four times a day (QID) | INTRAVENOUS | Status: DC
  Administered 2020-05-25 – 2020-05-27 (×7): 25 g via INTRAVENOUS
  Filled 2020-05-25 (×7): qty 100

## 2020-05-25 MED ORDER — FOLIC ACID 1 MG PO TABS
1.0000 mg | ORAL_TABLET | Freq: Every day | ORAL | Status: DC
  Administered 2020-05-25 – 2020-06-21 (×26): 1 mg via ORAL
  Filled 2020-05-25 (×27): qty 1

## 2020-05-25 MED ORDER — ALBUMIN HUMAN 25 % IV SOLN
125.0000 g | Freq: Once | INTRAVENOUS | Status: DC
  Filled 2020-05-25: qty 500

## 2020-05-25 MED ORDER — LORAZEPAM 2 MG/ML IJ SOLN: 2.0000 mg | INTRAMUSCULAR | Status: DC | PRN

## 2020-05-25 MED ORDER — ONDANSETRON HCL 4 MG PO TABS
4.0000 mg | ORAL_TABLET | Freq: Four times a day (QID) | ORAL | Status: DC | PRN
  Administered 2020-06-08: 4 mg via ORAL
  Filled 2020-05-25: qty 1

## 2020-05-25 MED ORDER — HEMIN 350 MG IV SOLR
1.5000 mg/kg/d | INTRAVENOUS | Status: DC
  Filled 2020-05-25: qty 116.41

## 2020-05-25 MED ORDER — HYDROMORPHONE HCL 1 MG/ML IJ SOLN
0.5000 mg | Freq: Once | INTRAMUSCULAR | Status: AC
  Administered 2020-05-25: 0.5 mg via INTRAVENOUS
  Filled 2020-05-25: qty 1

## 2020-05-25 MED ORDER — PANTOPRAZOLE SODIUM 40 MG PO TBEC
40.0000 mg | DELAYED_RELEASE_TABLET | Freq: Every day | ORAL | Status: DC
  Administered 2020-05-25 – 2020-06-15 (×21): 40 mg via ORAL
  Filled 2020-05-25 (×21): qty 1

## 2020-05-25 MED ORDER — DIPHENHYDRAMINE HCL 50 MG/ML IJ SOLN
50.0000 mg | Freq: Once | INTRAMUSCULAR | Status: AC
  Administered 2020-05-25: 50 mg via INTRAVENOUS
  Filled 2020-05-25: qty 1

## 2020-05-25 MED ORDER — OXYCODONE HCL 5 MG PO TABS: 5.0000 mg | ORAL_TABLET | ORAL | Status: DC | PRN

## 2020-05-25 MED ORDER — SODIUM CHLORIDE 0.9 % IV SOLN
2.0000 g | Freq: Two times a day (BID) | INTRAVENOUS | Status: DC
  Administered 2020-05-25 – 2020-05-26 (×2): 2 g via INTRAVENOUS
  Filled 2020-05-25: qty 2
  Filled 2020-05-25 (×3): qty 20

## 2020-05-25 MED ORDER — ONDANSETRON HCL 4 MG/2ML IJ SOLN
4.0000 mg | Freq: Four times a day (QID) | INTRAMUSCULAR | Status: DC | PRN
  Administered 2020-05-25 – 2020-06-17 (×5): 4 mg via INTRAVENOUS
  Filled 2020-05-25 (×6): qty 2

## 2020-05-25 MED ORDER — HYDROMORPHONE HCL 1 MG/ML IJ SOLN
1.0000 mg | INTRAMUSCULAR | Status: DC | PRN
  Administered 2020-05-25 – 2020-05-28 (×10): 1 mg via INTRAVENOUS
  Filled 2020-05-25 (×11): qty 1

## 2020-05-25 MED ORDER — ALPRAZOLAM 0.25 MG PO TABS
0.2500 mg | ORAL_TABLET | Freq: Every evening | ORAL | Status: DC | PRN
  Administered 2020-05-26 – 2020-06-14 (×7): 0.25 mg via ORAL
  Filled 2020-05-25 (×10): qty 1

## 2020-05-25 MED ORDER — SODIUM CHLORIDE 0.9% FLUSH
3.0000 mL | INTRAVENOUS | Status: DC | PRN
  Administered 2020-06-02: 3 mL via INTRAVENOUS

## 2020-05-25 MED ORDER — HYDROMORPHONE HCL 1 MG/ML IJ SOLN
0.5000 mg | INTRAMUSCULAR | Status: AC
  Administered 2020-05-25: 0.5 mg via INTRAVENOUS
  Filled 2020-05-25: qty 1

## 2020-05-25 MED ORDER — ALBUMIN HUMAN 25 % IV SOLN
125.0000 g | Freq: Once | INTRAVENOUS | Status: AC
  Administered 2020-05-25: 125 g via INTRAVENOUS
  Filled 2020-05-25: qty 500

## 2020-05-25 MED ORDER — BACLOFEN 10 MG PO TABS
10.0000 mg | ORAL_TABLET | Freq: Three times a day (TID) | ORAL | Status: DC
  Administered 2020-05-25 – 2020-06-21 (×73): 10 mg via ORAL
  Filled 2020-05-25 (×87): qty 1

## 2020-05-25 MED ORDER — HYDROMORPHONE HCL 1 MG/ML IJ SOLN
2.0000 mg | Freq: Once | INTRAMUSCULAR | Status: AC
  Administered 2020-05-25: 2 mg via INTRAVENOUS
  Filled 2020-05-25: qty 2

## 2020-05-25 MED ORDER — SODIUM CHLORIDE 0.9 % IV SOLN: 250.0000 mL | INTRAVENOUS | Status: DC | PRN

## 2020-05-25 MED ORDER — ALBUMIN HUMAN 25 % IV SOLN
130.0000 g | Freq: Once | INTRAVENOUS | Status: DC
  Filled 2020-05-25: qty 550

## 2020-05-25 MED ORDER — IOHEXOL 300 MG/ML  SOLN
75.0000 mL | Freq: Once | INTRAMUSCULAR | Status: AC | PRN
  Administered 2020-05-25: 75 mL via INTRAVENOUS

## 2020-05-25 NOTE — ED Notes (Signed)
Bed adjusted to attempt and make pt comfortable.  Lights dimmed.

## 2020-05-25 NOTE — H&P (Addendum)
History and Physical    John [name] WVP:[id number] DOB: March 02, 1956 DOA: 05/11/2020  PCP: Cletis [place], MD   Patient coming from: Home  I have personally briefly reviewed patient's old medical records in [place]  Chief Complaint: Abdominal Pain  HPI: John [name] is a 64 y.o. 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  Patient states that he usually has pain in his abdomen but over the last 2 days it become so severe and he rates it a 10 x 10 in intensity at its worst.  Pain is diffuse and intermittent, occurring in spasms.  He denies having any fever, no vomiting, no diarrhea, no dysuria, no melena stools, no hematemesis or hematochezia. Patient receives all his oncological care at [facility] and had requested transfer to [facility].  They have no beds available at this time and patient will be admitted to [facility] pending bed availability at [facility] show sodium 130, potassium 4.4, chloride 91, bicarb 29, BUN 39, creatinine 1.9, calcium 8.5, alkaline phosphatase 119, AST 80, ALT 20, total protein 6.4, ammonia 25, total bilirubin 3.1, white count 4.6, hemoglobin 14.0, hematocrit 40.8, MCV 91.9, RDW 18.9, platelet count 19,000, TSH 0.578 CT scan of abdomen and pelvis shows mild wall thickening of the stomach and proximal ascending colon.  Multifocal hepatocellular carcinoma is identified within the background of liver cirrhosis.  Tumor burden within the liver appears similar to previous exam with a new focal area of low attenuation within the inferior right hepatic lobe.  Thrombus within the medial portal vein is identified. Stigmata of portal venous hypertension is identified  including ascites, splenomegaly and varices. Chest x-ray reviewed by me shows no active disease Twelve-lead EKG shows sinus arrhythmia with PVCs   ED Course: Patient is a 64 year old Caucasian male with a history of hepatocellular carcinoma and cholangiocarcinoma who presents to the ER for evaluation of worsening abdominal pain.  Ascitic fluid studies is consistent with SBP as he has > 250 PMN.  Patient also noted to have acute kidney injury 0.70 >> 1.90 as well as elevated total bilirubin level.  Patient also noted to have a portal vein thrombus but is not a candidate for anticoagulation due to severe thrombocytopenia.  Patient received a dose of Rocephin, he will be admitted to the hospital for further evaluation.  Review of Systems: As per HPI otherwise 10 point review of systems negative.    Past Medical History:  Diagnosis Date  . Abnormal MRI, lumbar spine (2015) 01/21/2016   FINDINGS:  L3-4: Tiny right foraminal and extra foraminal disc bulge adjacent to but not compressing the L3 nerve lateral to the neural foramen. L4-5: There is a small broad-based disc bulge. There is also hypertrophy of the ligamentum flavum and facet joints, left more than right creating moderately severe spinal stenosis and bilateral lateral recess stenosis, left greater than right. This appe  . Anxiety   . Back ache 05/06/2014  . Cervical pain 05/06/2014  . 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[place]) 06/23/2015  . COPD (chronic obstructive pulmonary disease) ([place])   . De Quervain's tenosynovitis, left 07/07/2017  . Depression   . Dystonia   . Epileptic disorder ([place]) 06/23/2015  . Extremity pain 05/06/2014  . Febrile seizures ([place])    child  . Foot  pain 09/11/2014  . Gastric ulcer 06/23/2015  . Gout   . H/O neoplasm 06/23/2015  . Head injury 06/23/2015  . Hemorrhoids   . Hepatic cirrhosis ([place]) 06/23/2015  . Hepatitis B   . History of GI bleed   . Leukopenia   . Liver cancer ([place]) 08/2018  . Neurogenic pain  01/21/2016  . Nodule of upper lobe of left lung 12/02/2017  . Non-small cell cancer of left lung ([place]) 2019   Rad tx's.   . Obstructive sleep apnea 06/23/2015  . 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 Osteoarthritis of spine with radiculopathy, lumbosacral region 06/23/2015  . Peripheral neuropathy 06/23/2015  . Peripheral neuropathy ([place]) 66/44/0347  . Personal history of tobacco use, presenting hazards to health 01/09/2016  . Pulse irregularity   . Seizures ([place])   . Spinal stenosis   . Splenomegaly 05/30/2013  . Thrombocytopenia ([place]) 06/23/2015  . Uncomplicated opioid dependence ([place]) 06/23/2015    Past Surgical History:  Procedure Laterality Date  . ESOPHAGOGASTRODUODENOSCOPY  11/2013  . ESOPHAGOGASTRODUODENOSCOPY (EGD) WITH PROPOFOL N/A 06/23/2018   Procedure: ESOPHAGOGASTRODUODENOSCOPY (EGD) WITH PROPOFOL;  Surgeon: Manya Silvas, MD;  Location[facility]COPY;  Service: Endoscopy;  Laterality: N/A;  . surgery for cervical neck fracture    . TOOTH EXTRACTION Right 09/28/2016     reports that he quit smoking about 2 years ago. His smoking use included cigarettes. He started smoking about 2 years ago. He has a 17.50 pack-year smoking history. His smokeless tobacco use includes snuff. He reports that he does not drink alcohol and does not use drugs.  Allergies  Allergen Reactions  . Codeine Nausea Only  . Morphine Nausea Only  . Oxycodone Hives  . Duloxetine Rash    Made me sick    Family History  Problem Relation Age of Onset  . Stroke Mother   . Cancer Father 82       prostate cancer  . Heart disease Father   . Stroke Father   . Cancer Brother 60       Lung Cancer  . Cancer Maternal Aunt        breast cancer   . Cancer Cousin        breast cancer     Prior to Admission medications   Medication Sig Start Date End Date Taking? Authorizing Provider  ALPRAZolam (XANAX) 0.25 MG tablet Take 1 tablet (0.25 mg total) by mouth  at bedtime as needed for anxiety or sleep. 08/08/19   [name], MD  baclofen (LIORESAL) 10 MG tablet Take 1 tablet (10 mg total) by mouth 3 (three) times daily. 02/27/20 08/25/20  Milinda Pointer, MD  cetirizine (ZYRTEC) 10 MG tablet Take 10 mg by mouth daily. 11/09/17   [provider]  folic acid (FOLVITE) 1 MG tablet Take 1 mg by mouth daily.     [provider]  gabapentin (NEURONTIN) 600 MG tablet Take 1 tablet (600 mg total) by mouth 3 (three) times daily. 02/27/20 08/25/20  Milinda Pointer, MD  Magnesium 500 MG CAPS Take 500 mg by mouth 2 (two) times a day.    [provider]  omeprazole (PRILOSEC) 20 MG capsule Take 1 capsule (20 mg total) by mouth daily. Patient taking differently: Take 20 mg by mouth 2 (two) times daily before a meal.  11/28/19 11/27/20  Milinda Pointer, MD  ondansetron (ZOFRAN) 8 MG tablet Take 1 tablet (8 mg total) by mouth every 8 (eight) hours as needed for nausea  or vomiting. 01/25/19   [name], MD  oxyCODONE (OXY IR/ROXICODONE) 5 MG immediate release tablet Take 1-2 tablets by mouth every 4 (four) hours as needed. 4-6 hrs prn 12/14/19   [provider]  prochlorperazine (COMPAZINE) 10 MG tablet Take 1 tablet (10 mg total) by mouth every 6 (six) hours as needed for nausea or vomiting. 01/25/19   [name], MD  bisacodyl (DULCOLAX) 5 MG EC tablet Take 1 tablet (5 mg total) by mouth daily as needed for moderate constipation. 06/07/19 08/22/19  Gillis Santa, MD    Physical Exam: Vitals:   05/20/2020 0404 05/11/2020 0730 05/10/2020 0800 05/09/2020 0830  BP: 103/72 (!) 141/66 137/72 (!) 147/75  Pulse: 89 88 84 85  Resp: 18 17 10 10   Temp: 98 F (36.7 C) 97.9 F (36.6 C)    TempSrc: Oral Oral    SpO2: 96% 100% 100% 100%  Weight:      Height:         Vitals:   05/19/2020 0404 05/30/2020 0730 05/09/2020 0800 05/15/2020 0830  BP: 103/72 (!) 141/66 137/72 (!) 147/75  Pulse: 89 88 84 85  Resp: 18 17 10 10   Temp: 98 F (36.7 C) 97.9 F  (36.6 C)    TempSrc: Oral Oral    SpO2: 96% 100% 100% 100%  Weight:      Height:        Constitutional: NAD, alert and oriented x 3.  Chronically ill-appearing.  Sleeping but arouses easily Eyes: PERRL, lids and conjunctivae pallor ENMT: Mucous membranes are dry.  Neck: normal, supple, no masses, no thyromegaly Respiratory: Rales at the bases, no wheezing. Normal respiratory effort. No accessory muscle use.  Cardiovascular: Regular rate and rhythm, no murmurs / rubs / gallops. No extremity edema. 2+ pedal pulses. No carotid bruits.  Abdomen: Distended, diffusely tenderness, Hepatosplenomegaly,bowel sounds positive.  Peritoneal catheter in place in the right lower quadrant, nonhealing ulcer from prior paracentesis in the right lower quadrant (POA) Musculoskeletal: no clubbing / cyanosis. No joint deformity upper and lower extremities.  Skin: no rashes, lesions, ulcers.  Neurologic: No gross focal neurologic deficit. Psychiatric: Normal mood and affect.   Labs on Admission: I have personally reviewed following labs and imaging studies  CBC: Recent Labs  Lab 05/30/2020 0402  WBC 4.6  NEUTROABS 3.3  HGB 14.0  HCT 40.8  MCV 91.9  PLT 19*   Basic Metabolic Panel: Recent Labs  Lab 05/21/2020 0402  NA 130*  K 4.4  CL 91*  CO2 29  GLUCOSE 112*  BUN 39*  CREATININE 1.90*  CALCIUM 8.5*   GFR: Estimated Creatinine Clearance: 43.7 mL/min (A) (by C-G formula based on SCr of 1.9 mg/dL (H)). Liver Function Tests: Recent Labs  Lab 05/07/2020 0402  AST 80*  ALT 20  ALKPHOS 119  BILITOT 3.1*  PROT 6.4*  ALBUMIN 2.7*   Recent Labs  Lab 05/12/2020 0402  LIPASE 44   Recent Labs  Lab 06/05/2020 0413  AMMONIA 25   Coagulation Profile: No results for input(s): INR, PROTIME in the last 168 hours. Cardiac Enzymes: No results for input(s): CKTOTAL, CKMB, CKMBINDEX, TROPONINI in the last 168 hours. BNP (last 3 results) No results for input(s): PROBNP in the last 8760  hours. HbA1C: No results for input(s): HGBA1C in the last 72 hours. CBG: No results for input(s): GLUCAP in the last 168 hours. Lipid Profile: No results for input(s): CHOL, HDL, LDLCALC, TRIG, CHOLHDL, LDLDIRECT in the last 72 hours. Thyroid Function Tests: No  results for input(s): TSH, T4TOTAL, FREET4, T3FREE, THYROIDAB in the last 72 hours. Anemia Panel: No results for input(s): VITAMINB12, FOLATE, FERRITIN, TIBC, IRON, RETICCTPCT in the last 72 hours. Urine analysis:    Component Value Date/Time   COLORURINE YELLOW 11/02/2019 1016   APPEARANCEUR CLEAR 11/02/2019 1016   APPEARANCEUR Clear 04/17/2012 2104   LABSPEC 1.010 11/02/2019 1016   LABSPEC 1.008 04/17/2012 2104   PHURINE 6.0 11/02/2019 1016   GLUCOSEU NEGATIVE 11/02/2019 1016   GLUCOSEU Negative 04/17/2012 2104   HGBUR NEGATIVE 11/02/2019 1016   BILIRUBINUR NEGATIVE 11/02/2019 1016   BILIRUBINUR Negative 04/17/2012 2104   KETONESUR NEGATIVE 11/02/2019 1016   PROTEINUR NEGATIVE 11/02/2019 1020   NITRITE NEGATIVE 11/02/2019 1016   LEUKOCYTESUR NEGATIVE 11/02/2019 1016   LEUKOCYTESUR Negative 04/17/2012 2104    Radiological Exams on Admission: DG Chest 1 View  Result Date: 05/15/2020 CLINICAL DATA:  Cough EXAM: CHEST  1 VIEW COMPARISON:  04/17/2012 FINDINGS: The heart size and mediastinal contours are within normal limits. Both lungs are clear. The visualized skeletal structures are unremarkable. IMPRESSION: No active disease. Electronically Signed   By: Ulyses Jarred M.D.   On: 05/30/2020 04:46   CT ABDOMEN PELVIS W CONTRAST  Result Date: 05/12/2020 CLINICAL DATA:  Abdominal pain. Acute, nonlocalized. Stage IV liver cancer. EXAM: CT ABDOMEN AND PELVIS WITH CONTRAST TECHNIQUE: Multidetector CT imaging of the abdomen and pelvis was performed using the standard protocol following bolus administration of intravenous contrast. CONTRAST:  56mL OMNIPAQUE IOHEXOL 300 MG/ML  SOLN COMPARISON:  09/10/2019 FINDINGS: Lower chest:  Trace right pleural effusion. Hepatobiliary: The liver is cirrhotic. 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 Dominant lesion within the anterior right lobe of liver measures 9.5 by 6.2 by 8.6 cm (volume = 270 cm^3), image 18/2. Previously 9.7 x 5.9 by 8.5 cm (volume = 250 cm^3). 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 New foci of low attenuation within the inferior right hepatic lobe measures 0.8 cm, image 32/2. Pancreas: Unremarkable. No pancreatic ductal dilatation or surrounding inflammatory changes. Spleen: Splenomegaly. Spleen has a cranial caudal dimension of 15.7 cm, image 63/5. Unchanged. Adrenals/Urinary Tract: Normal appearance of the adrenal glands. No kidney mass or hydronephrosis identified. Urinary bladder normal. Stomach/Bowel: Mild wall thickening of the stomach is identified. The appendix is visualized and appears normal. Mild wall thickening of the proximal ascending colon is identified. Vascular/Lymphatic: Aortic atherosclerosis. No aneurysm. Perisplenic varices. Recanalization of the umbilical vein. Upper abdominal adenopathy is again noted. Index portacaval node measures 3.8 x 3.4 cm, image 28/2. Previously 4.3 x 3.5 cm. Porta hepatic node measures 1.5 x 1.7 cm, image 21/2. Previously 2.7 by 2.5 cm. Soft tissue stranding is identified within the retroperitoneum along with multiple small lymph nodes. Similar to previous exam. Index left retroperitoneal lymph node measures 1 cm, image 53/2. Previously 1.7 cm. Aortocaval node measures 1.1 cm, image 41/2. Previously 1.6 cm. No pelvic or inguinal adenopathy. Reproductive: Prostate is unremarkable. Other: Small volume of abdominopelvic ascites is similar to previous exam. Tunneled peritoneal catheter is identified which enters from a right lower quadrant approach with tip terminating in the left lower quadrant. Musculoskeletal: No acute or significant osseous findings. IMPRESSION: 1. Mild wall thickening of the  stomach and proximal ascending colon. Nonspecific in the setting of ascites and portal venous hypertension. Correlate for any clinical signs or symptoms of gastritis and or colitis 2. Multifocal hepatocellular carcinoma is identified within a background of cirrhosis. Tumor burden within the liver appears similar to previous exam with  a new focal area of low attenuation within the inferior right hepatic lobe. Thrombus within the middle portal vein is identified. 3. Mild decrease in size of upper abdominal adenopathy. 4. Stigmata of portal venous hypertension is identified including ascites, splenomegaly and varices. 5. Similar appearance of soft tissue stranding within the retroperitoneum with multiple small lymph nodes. 6. Aortic atherosclerosis. Aortic Atherosclerosis (ICD10-I70.0). Electronically Signed   By: Kerby Moors M.D.   On: 05/11/2020 07:07    EKG: Independently reviewed.  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 He has massive ascites and is status post multiple paracentesis and recently had a peritoneal catheter inserted on 05/07/20 for fluid drainage Ascitic fluid yields greater than 250 PMN. Will place patient on ceftriaxone 2 g IV every 12 We will administer albumin 1.5 g/kg x 1 Follow-up results of ascitic fluid culture   Acute kidney injury Unclear etiology may be secondary to hepatorenal syndrome  At baseline patient has a serum creatinine of 0.70 and today on admission it is 1.9 Avoid nephrotoxic agents We will request nephrology  consult     Portal vein thrombus Found on CT scan of abdomen and pelvis done for evaluation of abdominal pain Patient is a noted candidate for anticoagulation due to severe thrombocytopenia    Thrombocytopenia Platelet count is 19,000 with no evidence of bleeding Thrombocytopenia appears to be secondary to hypersplenism secondary to liver cirrhosis ?? ITP We will request oncology consult    Mixed HCC/cholangiocarcinoma s/p Y90 radioembolization in 2019 followed by Gem/Cis and capecitabine monotherapy with disease progression currently on Bev/Atezo. Last dose on 03/26/2020.  Follow-up with oncology as an outpatient    History of seizure disorder Not on any antiepileptic medications at this time  We will place patient on seizure precaution Administer Ativan as needed for seizures    Anxiety disorder Continue as needed alprazolam     DVT prophylaxis: SCD Code Status: Full code Family Communication: Greater than 50% of time was spent discussing plan of care with patient at the bedside.  All questions and concerns have been addressed.  He verbalizes understanding and agrees with the plan.  CODE STATUS was discussed and he wishes to remain a full code.  He names his brother Corwin Kuiken as his healthcare power of attorney Disposition Plan: Back to previous home environment Consults called: Oncology/nephrology    Collier Bullock MD [facility]     05/21/2020, 9:23 AM

## 2020-05-25 NOTE — ED Notes (Signed)
Patient oxygen saturation decreased to 82%. Patient placed on 4l O2 and saturation increased to 95%. Dr. Tamala Julian notified.

## 2020-05-25 NOTE — ED Notes (Signed)
Dressing off intraperitoneal catheter when RN entered room.  New sterile dressing placed with sterile technique.

## 2020-05-25 NOTE — ED Triage Notes (Signed)
Pt arivves via [facility] with c/o abdominal pain since 2000 last evening. Pt is a stage 4 liver cancer pt at [facility] and has a drainage tube which appears to be draining outside of bandage. Per EMS, pt takes oxycodone and benadryl for pain which he reports last dose 0200. EMS reports VS BP 118/59, 96% RA, and pulse 93. Pt also c/o chest pain.

## 2020-05-25 NOTE — Consult Note (Signed)
[facility]  CONSULT NOTE    Date: 05/13/2020                  Patient Name:  John [name]  MRN: [id number]  DOB: [birth year]  Age / Sex: 64 y.o., male         PCP: Cletis Athens, MD                 Service Requesting Consult: Dr. Francine Graven                 Reason for Consult: Acute renal failure            History of Present Illness: Mr. DEVION CHRISCOE admitted to [facility] with abdominal pain. Was found to have spontaneous bacterial peritonitis. Patient with severe pain, nausea but no vomiting. Poor appetite. He denies any diarrhea.   Patient was recently treated for abdominal wall cellulitis requiring admission to [place] from 8/21 to 8/22. He states his abdominal pain was since that time.    Medications: Outpatient medications: (Not in a hospital admission)   Current medications: Current Facility-Administered Medications  Medication Dose Route Frequency Provider Last Rate Last Admin  . 0.9 %  sodium chloride infusion  250 mL Intravenous PRN Agbata, Tochukwu, MD      . ALPRAZolam Duanne Moron) tablet 0.25 mg  0.25 mg Oral QHS PRN Agbata, Tochukwu, MD      . baclofen (LIORESAL) tablet 10 mg  10 mg Oral TID Agbata, Tochukwu, MD   10 mg at 05/18/2020 0953  . cefTRIAXone (ROCEPHIN) 2 g in sodium chloride 0.9 % 100 mL IVPB  2 g Intravenous Q12H Agbata, Tochukwu, MD      . folic acid (FOLVITE) tablet 1 mg  1 mg Oral Daily Agbata, Tochukwu, MD   1 mg at 05/16/2020 0953  . HYDROmorphone (DILAUDID) injection 1 mg  1 mg Intravenous Q4H PRN Agbata, Tochukwu, MD   1 mg at 05/22/2020 0954  . loratadine (CLARITIN) tablet 10 mg  10 mg Oral Daily Agbata, Tochukwu, MD   10 mg at 05/10/2020 0953  . LORazepam (ATIVAN) injection 2 mg  2 mg Intravenous Q4H PRN Agbata, Tochukwu, MD      . magnesium oxide (MAG-OX) tablet 400 mg  400 mg Oral Daily Agbata, Tochukwu, MD   400 mg at 05/17/2020 0953  . ondansetron (ZOFRAN) tablet 4 mg  4 mg Oral Q6H PRN Agbata, Tochukwu, MD       Or  . ondansetron  (ZOFRAN) injection 4 mg  4 mg Intravenous Q6H PRN Agbata, Tochukwu, MD   4 mg at 05/16/2020 0954  . pantoprazole (PROTONIX) EC tablet 40 mg  40 mg Oral Daily Agbata, Tochukwu, MD   40 mg at 05/09/2020 0953  . sodium chloride flush (NS) 0.9 % injection 3 mL  3 mL Intravenous Q12H Agbata, Tochukwu, MD   3 mL at 05/16/2020 0953  . sodium chloride flush (NS) 0.9 % injection 3 mL  3 mL Intravenous PRN Agbata, Tochukwu, MD       Current Outpatient Medications  Medication Sig Dispense Refill  . baclofen (LIORESAL) 10 MG tablet Take 1 tablet (10 mg total) by mouth 3 (three) times daily. 270 tablet 1  . ciprofloxacin (CIPRO) 500 MG tablet Take 500 mg by mouth daily.    Marland Kitchen DOPTELET 20 MG TABS Take 40 mg by mouth daily.    . folic acid (FOLVITE) 1 MG tablet Take 1 mg by mouth daily.     Marland Kitchen  furosemide (LASIX) 40 MG tablet Take 40 mg by mouth daily.    Marland Kitchen gabapentin (NEURONTIN) 600 MG tablet Take 1 tablet (600 mg total) by mouth 3 (three) times daily. 270 tablet 1  . lactulose (CHRONULAC) 10 GM/15ML solution Take 15 mLs by mouth 3 (three) times daily. Take 15 mls two to three times daily so that you have 3-4 bowel movements per day    . Magnesium 500 MG CAPS Take 500 mg by mouth 2 (two) times a day.    Marland Kitchen omeprazole (PRILOSEC) 20 MG capsule Take 1 capsule (20 mg total) by mouth daily. (Patient taking differently: Take 20 mg by mouth 2 (two) times daily before a meal. ) 90 capsule 3  . oxyCODONE (OXY IR/ROXICODONE) 5 MG immediate release tablet Take 1-2 tablets by mouth every 4 (four) hours as needed. 4-6 hrs prn    . ondansetron (ZOFRAN) 8 MG tablet Take 1 tablet (8 mg total) by mouth every 8 (eight) hours as needed for nausea or vomiting. 20 tablet 0  . spironolactone (ALDACTONE) 25 MG tablet Take 25 mg by mouth daily. (Patient not taking: Reported on 05/16/2020)        Allergies: Allergies  Allergen Reactions  . Codeine Nausea Only  . Morphine Nausea Only  . Duloxetine Rash    Made me sick  . Oxycodone Hives       Past Medical History: Past Medical History:  Diagnosis Date  . Abnormal MRI, lumbar spine (2015) 01/21/2016   FINDINGS:  L3-4: Tiny right foraminal and extra foraminal disc bulge adjacent to but not compressing the L3 nerve lateral to the neural foramen. L4-5: There is a small broad-based disc bulge. There is also hypertrophy of the ligamentum flavum and facet joints, left more than right creating moderately severe spinal stenosis and bilateral lateral recess stenosis, left greater than right. This appe  . Anxiety   . Back ache 05/06/2014  . Cervical pain 05/06/2014  . 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[place]) 06/23/2015  . COPD (chronic obstructive pulmonary disease) ([place])   . De Quervain's tenosynovitis, left 07/07/2017  . Depression   . Dystonia   . Epileptic disorder ([place]) 06/23/2015  . Extremity pain 05/06/2014  . Febrile seizures ([place])    child  . Foot pain 09/11/2014  . Gastric ulcer 06/23/2015  . Gout   . H/O neoplasm 06/23/2015  . Head injury 06/23/2015  . Hemorrhoids   . Hepatic cirrhosis ([place]) 06/23/2015  . Hepatitis B   . History of GI bleed   . Leukopenia   . Liver cancer ([place]) 08/2018  . Neurogenic pain 01/21/2016  . Nodule of upper lobe of left lung 12/02/2017  . Non-small cell cancer of left lung ([place]) 2019   Rad tx's.   . Obstructive sleep apnea 06/23/2015  . 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 Osteoarthritis of spine with radiculopathy, lumbosacral region 06/23/2015  . Peripheral neuropathy 06/23/2015  . Peripheral neuropathy ([place]) 58/05/9832  . Personal history of tobacco use, presenting hazards to health 01/09/2016  . Pulse irregularity   . Seizures ([place])   . Spinal stenosis   . Splenomegaly 05/30/2013  . Thrombocytopenia ([place]) 06/23/2015  . Uncomplicated opioid dependence ([place]) 06/23/2015     Past Surgical History: Past Surgical History:  Procedure Laterality Date  . ESOPHAGOGASTRODUODENOSCOPY  11/2013  .  ESOPHAGOGASTRODUODENOSCOPY (EGD) WITH PROPOFOL N/A 06/23/2018   Procedure: ESOPHAGOGASTRODUODENOSCOPY (EGD) WITH PROPOFOL;  Surgeon: Manya Silvas, MD;  Location[facility] ENDOSCOPY;  Service: Endoscopy;  Laterality: N/A;  . surgery for cervical neck fracture    . TOOTH EXTRACTION Right 09/28/2016     Family History: Family History  Problem Relation Age of Onset  . Stroke Mother   . Cancer Father 82       prostate cancer  . Heart disease Father   . Stroke Father   . Cancer Brother 60       Lung Cancer  . Cancer Maternal Aunt        breast cancer   . Cancer Cousin        breast cancer     Social History: Social History   Socioeconomic History  . Marital status: Divorced    Spouse name: Not on file  . Number of children: Not on file  . Years of education: Not on file  . Highest education level: Not on file  Occupational History  . Not on file  Tobacco Use  . Smoking status: Former Smoker    Packs/day: 0.50    Years: 35.00    Pack years: 17.50    Types: Cigarettes    Start date: 10/07/2017    Quit date: 10/07/2017    Years since quitting: 2.6  . Smokeless tobacco: Current User    Types: Snuff  Vaping Use  . Vaping Use: Every day  Substance and Sexual Activity  . Alcohol use: No    Alcohol/week: 0.0 standard drinks    Comment: Pt used to drink alcohol for 30 years, states that he has been alcohol free since July 2013...  . Drug use: No  . Sexual activity: Not on file  Other Topics Concern  . Not on file  Social History Narrative  . Not on file   Social Determinants of Health   Financial Resource Strain:   . Difficulty of Paying Living Expenses: Not on file  Food Insecurity:   . Worried About Charity fundraiser in the Last Year: Not on file  . Ran Out of Food in the Last Year: Not on file  Transportation Needs:   . Lack of Transportation (Medical): Not on file  . Lack of Transportation (Non-Medical): Not on file  Physical Activity:   . Days of Exercise  per Week: Not on file  . Minutes of Exercise per Session: Not on file  Stress:   . Feeling of Stress : Not on file  Social Connections:   . Frequency of Communication with Friends and Family: Not on file  . Frequency of Social Gatherings with Friends and Family: Not on file  . Attends Religious Services: Not on file  . Active Member of Clubs or Organizations: Not on file  . Attends Archivist Meetings: Not on file  . Marital Status: Not on file  Intimate Partner Violence:   . Fear of Current or Ex-Partner: Not on file  . Emotionally Abused: Not on file  . Physically Abused: Not on file  . Sexually Abused: Not on file     Review of Systems: Review of Systems  Constitutional: Negative.   HENT: Negative.   Eyes: Negative.   Respiratory: Negative for cough, hemoptysis, sputum production, shortness of breath and wheezing.   Cardiovascular: Positive for leg swelling. Negative for chest pain, palpitations, orthopnea, claudication and PND.  Gastrointestinal: Positive for abdominal pain.  Genitourinary: Negative for dysuria, flank pain, frequency, hematuria and urgency.  Musculoskeletal: Negative for back pain, falls, joint pain, myalgias and neck pain.  Skin: Negative.   Neurological: Negative.  Endo/Heme/Allergies: Negative.   Psychiatric/Behavioral: Negative.     Vital Signs: Blood pressure 99/79, pulse 90, temperature 97.9 F (36.6 C), temperature source Oral, resp. rate 17, height 6' (1.829 m), weight 88 kg, SpO2 98 %.  Weight trends: Filed Weights   05/10/2020 0400  Weight: 88 kg    Physical Exam: General: NAD,   Head: Normocephalic, atraumatic. Moist oral mucosal membranes  Eyes: Anicteric, PERRL  Neck: Supple, trachea midline  Lungs:  Clear to auscultation  Heart: Regular rate and rhythm  Abdomen:  +distended, +abdominal drain  Extremities: + peripheral edema.  Neurologic: Nonfocal, moving all four extremities  Skin: No lesions         Lab  results: Basic Metabolic Panel: Recent Labs  Lab 06/03/2020 0402  NA 130*  K 4.4  CL 91*  CO2 29  GLUCOSE 112*  BUN 39*  CREATININE 1.90*  CALCIUM 8.5*    Liver Function Tests: Recent Labs  Lab 06/04/2020 0402  AST 80*  ALT 20  ALKPHOS 119  BILITOT 3.1*  PROT 6.4*  ALBUMIN 2.7*   Recent Labs  Lab 05/22/2020 0402  LIPASE 44   Recent Labs  Lab 05/13/2020 0413  AMMONIA 25    CBC: Recent Labs  Lab 05/10/2020 0402  WBC 4.6  NEUTROABS 3.3  HGB 14.0  HCT 40.8  MCV 91.9  PLT 19*    Cardiac Enzymes: No results for input(s): CKTOTAL, CKMB, CKMBINDEX, TROPONINI in the last 168 hours.  BNP: Invalid input(s): POCBNP  CBG: No results for input(s): GLUCAP in the last 168 hours.  Microbiology: Results for orders placed or performed during the hospital encounter of 05/10/2020  SARS Coronavirus 2 by RT PCR (hospital order, performed in [facility] hospital lab) Nasopharyngeal Nasopharyngeal Swab     Status: None   Collection Time: 05/20/2020  8:31 AM   Specimen: Nasopharyngeal Swab  Result Value Ref Range Status   SARS Coronavirus 2 NEGATIVE NEGATIVE Final    Comment: (NOTE) SARS-CoV-2 target nucleic acids are NOT DETECTED.  The SARS-CoV-2 RNA is generally detectable in upper and lower respiratory specimens during the acute phase of infection. The lowest concentration of SARS-CoV-2 viral copies this assay can detect is 250 copies / mL. A negative result does not preclude SARS-CoV-2 infection and should not be used as the sole basis for treatment or other patient management decisions.  A negative result may occur with improper specimen collection / handling, submission of specimen other than nasopharyngeal swab, presence of viral mutation(s) within the areas targeted by this assay, and inadequate number of viral copies (<250 copies / mL). A negative result must be combined with clinical observations, patient history, and epidemiological information.  Fact Sheet for  Patients:   StrictlyIdeas.no  Fact Sheet for Healthcare Providers: BankingDealers.co.za  This test is not yet approved or  cleared by the [place] FDA and has been authorized for detection and/or diagnosis of SARS-CoV-2 by FDA under an Emergency Use Authorization (EUA).  This EUA will remain in effect (meaning this test can be used) for the duration of the COVID-19 declaration under Section 564(b)(1) of the Act, 21 U.S.C. section 360bbb-3(b)(1), unless the authorization is terminated or revoked sooner.  Performed at [facility], [address] [place], [place] [address]     Coagulation Studies: No results for input(s): LABPROT, INR in the last 72 hours.  Urinalysis: No results for input(s): COLORURINE, LABSPEC, PHURINE, GLUCOSEU, HGBUR, BILIRUBINUR, KETONESUR, PROTEINUR, UROBILINOGEN, NITRITE, LEUKOCYTESUR in the last 72 hours.  Invalid  input(s): APPERANCEUR    Imaging: DG Chest 1 View  Result Date: 05/12/2020 CLINICAL DATA:  Cough EXAM: CHEST  1 VIEW COMPARISON:  04/17/2012 FINDINGS: The heart size and mediastinal contours are within normal limits. Both lungs are clear. The visualized skeletal structures are unremarkable. IMPRESSION: No active disease. Electronically Signed   By: Ulyses Jarred M.D.   On: 06/03/2020 04:46   CT ABDOMEN PELVIS W CONTRAST  Result Date: 05/20/2020 CLINICAL DATA:  Abdominal pain. Acute, nonlocalized. Stage IV liver cancer. EXAM: CT ABDOMEN AND PELVIS WITH CONTRAST TECHNIQUE: Multidetector CT imaging of the abdomen and pelvis was performed using the standard protocol following bolus administration of intravenous contrast. CONTRAST:  41mL OMNIPAQUE IOHEXOL 300 MG/ML  SOLN COMPARISON:  09/10/2019 FINDINGS: Lower chest: Trace right pleural effusion. Hepatobiliary: The liver is cirrhotic. 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 Dominant lesion within the anterior right lobe of liver  measures 9.5 by 6.2 by 8.6 cm (volume = 270 cm^3), image 18/2. Previously 9.7 x 5.9 by 8.5 cm (volume = 250 cm^3). 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 New foci of low attenuation within the inferior right hepatic lobe measures 0.8 cm, image 32/2. Pancreas: Unremarkable. No pancreatic ductal dilatation or surrounding inflammatory changes. Spleen: Splenomegaly. Spleen has a cranial caudal dimension of 15.7 cm, image 63/5. Unchanged. Adrenals/Urinary Tract: Normal appearance of the adrenal glands. No kidney mass or hydronephrosis identified. Urinary bladder normal. Stomach/Bowel: Mild wall thickening of the stomach is identified. The appendix is visualized and appears normal. Mild wall thickening of the proximal ascending colon is identified. Vascular/Lymphatic: Aortic atherosclerosis. No aneurysm. Perisplenic varices. Recanalization of the umbilical vein. Upper abdominal adenopathy is again noted. Index portacaval node measures 3.8 x 3.4 cm, image 28/2. Previously 4.3 x 3.5 cm. Porta hepatic node measures 1.5 x 1.7 cm, image 21/2. Previously 2.7 by 2.5 cm. Soft tissue stranding is identified within the retroperitoneum along with multiple small lymph nodes. Similar to previous exam. Index left retroperitoneal lymph node measures 1 cm, image 53/2. Previously 1.7 cm. Aortocaval node measures 1.1 cm, image 41/2. Previously 1.6 cm. No pelvic or inguinal adenopathy. Reproductive: Prostate is unremarkable. Other: Small volume of abdominopelvic ascites is similar to previous exam. Tunneled peritoneal catheter is identified which enters from a right lower quadrant approach with tip terminating in the left lower quadrant. Musculoskeletal: No acute or significant osseous findings. IMPRESSION: 1. Mild wall thickening of the stomach and proximal ascending colon. Nonspecific in the setting of ascites and portal venous hypertension. Correlate for any clinical signs or symptoms of gastritis and or colitis  2. Multifocal hepatocellular carcinoma is identified within a background of cirrhosis. Tumor burden within the liver appears similar to previous exam with a new focal area of low attenuation within the inferior right hepatic lobe. Thrombus within the middle portal vein is identified. 3. Mild decrease in size of upper abdominal adenopathy. 4. Stigmata of portal venous hypertension is identified including ascites, splenomegaly and varices. 5. Similar appearance of soft tissue stranding within the retroperitoneum with multiple small lymph nodes. 6. Aortic atherosclerosis. Aortic Atherosclerosis (ICD10-I70.0). Electronically Signed   By: Kerby Moors M.D.   On: 05/31/2020 07:07      Assessment & Plan: Mr. DUVID SMALLS is a 64 y.o. white male with cholangiocarcinoma and hepatocellular carcinoma, seizure disorder, neuropathy, hepatic cirrhosis, hepatitis B, gout, peptic ulcer disease, COPD, depression and anxiety who was admitted to [facility] on 06/01/2020 for SBP (spontaneous bacterial peritonitis) ([place]) [K65.2]  1. Acute renal failure:  baseline creatinine of 0.7 on 05/07/20.  Concern for prerenal azotemia verus acute hepatorenal syndrome.  IV contrast exposure on 9/19.  Patient may be intravascularly depleted and edema is due to hypoalbuminemia.  - Do not recommend diuretics at this time.  - IV albumin  - midodrine if blood pressure is low.  - Check urine studies.   2. Hypertension: holding spironolactone and furosemide,   3. Hyponatremia: secondary to hepatic failure.   4. Spontaneous Bacterial Peritonitis:  - ceftriaxone  5. Thrombocytopenia: secondary to liver failure.   LOS: 0 Bonetta Mostek 9/19/20211:54 PM

## 2020-05-25 NOTE — ED Notes (Signed)
Pt given urinal.

## 2020-05-25 NOTE — Consult Note (Signed)
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  He presented to the emergency department with significant abdominal pain and was found to have SBP.  He has a peritoneal catheter placed, but states this only drains about a liter at a time.  He is in obvious discomfort from pain, but otherwise feels well.  He has no neurologic complaints.  He denies any recent fevers.  He has a fair appetite, but denies weight loss.  He has no chest pain, shortness of breath, cough, or hemoptysis.  He denies any nausea, vomiting, constipation, or diarrhea.  He has no urinary complaints.  Patient feels generally terrible, but offers no further specific complaints today.  REVIEW OF SYSTEMS:   Review of Systems  Constitutional: Negative.  Negative for fever, malaise/fatigue and weight loss.  Respiratory: Negative.  Negative for cough, hemoptysis and shortness of breath.   Cardiovascular: Negative.  Negative for chest pain and leg swelling.  Gastrointestinal: Positive for abdominal pain.  Genitourinary: Negative.  Negative for dysuria.  Musculoskeletal: Negative.  Negative for back pain.  Skin: Negative.  Negative for rash.  Neurological: Positive for weakness. Negative for dizziness, focal weakness and headaches.  Psychiatric/Behavioral: The patient is not nervous/anxious.     As per HPI. Otherwise, a complete review of systems is negative.  PAST MEDICAL HISTORY: Past Medical  History:  Diagnosis Date  . Abnormal MRI, lumbar spine (2015) 01/21/2016   FINDINGS:  L3-4: Tiny right foraminal and extra foraminal disc bulge adjacent to but not compressing the L3 nerve lateral to the neural foramen. L4-5: There is a small broad-based disc bulge. There is also hypertrophy of the ligamentum flavum and facet joints, left more than right creating moderately severe spinal stenosis and bilateral lateral recess stenosis, left greater than right. This appe  . Anxiety   . Back ache 05/06/2014  . Cervical pain 05/06/2014  . 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[place]) 06/23/2015  . COPD (chronic obstructive pulmonary disease) ([place])   . De Quervain's tenosynovitis, left 07/07/2017  . Depression   . Dystonia   . Epileptic disorder ([place]) 06/23/2015  . Extremity pain 05/06/2014  . Febrile seizures ([place])    child  . Foot pain 09/11/2014  . Gastric ulcer 06/23/2015  . Gout   . H/O neoplasm 06/23/2015  . Head injury 06/23/2015  . Hemorrhoids   . Hepatic cirrhosis ([place]) 06/23/2015  . Hepatitis B   . History of GI bleed   . Leukopenia   . Liver cancer ([place]) 08/2018  . Neurogenic pain 01/21/2016  . Nodule of upper lobe of left lung 12/02/2017  . Non-small cell cancer of left lung ([place]) 2019   Rad tx's.   . Obstructive sleep apnea 06/23/2015  . 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 Osteoarthritis of spine with radiculopathy, lumbosacral region 06/23/2015  . Peripheral neuropathy 06/23/2015  . Peripheral neuropathy (Al[place]) 37/62/8315  . Personal history of tobacco use, presenting hazards to health 01/09/2016  . Pulse irregularity   . Seizures ([place])   . Spinal stenosis   . Splenomegaly 05/30/2013  . Thrombocytopenia (  [place]) 06/23/2015  . Uncomplicated opioid dependence ([place]) 06/23/2015    PAST SURGICAL HISTORY: Past Surgical History:  Procedure Laterality Date  . ESOPHAGOGASTRODUODENOSCOPY  11/2013  . ESOPHAGOGASTRODUODENOSCOPY (EGD) WITH PROPOFOL N/A  06/23/2018   Procedure: ESOPHAGOGASTRODUODENOSCOPY (EGD) WITH PROPOFOL;  Surgeon: Manya Silvas, MD;  Location: [facility] ENDOSCOPY;  Service: Endoscopy;  Laterality: N/A;  . surgery for cervical neck fracture    . TOOTH EXTRACTION Right 09/28/2016    FAMILY HISTORY: Family History  Problem Relation Age of Onset  . Stroke Mother   . Cancer Father 74       prostate cancer  . Heart disease Father   . Stroke Father   . Cancer Brother 60       Lung Cancer  . Cancer Maternal Aunt        breast cancer   . Cancer Cousin        breast cancer    ADVANCED DIRECTIVES (Y/N):  @ADVDIR @  HEALTH MAINTENANCE: Social History   Tobacco Use  . Smoking status: Former Smoker    Packs/day: 0.50    Years: 35.00    Pack years: 17.50    Types: Cigarettes    Start date: 10/07/2017    Quit date: 10/07/2017    Years since quitting: 2.6  . Smokeless tobacco: Current User    Types: Snuff  Vaping Use  . Vaping Use: Every day  Substance Use Topics  . Alcohol use: No    Alcohol/week: 0.0 standard drinks    Comment: Pt used to drink alcohol for 30 years, states that he has been alcohol free since July 2013...  . Drug use: No     Colonoscopy:  PAP:  Bone density:  Lipid panel:  Allergies  Allergen Reactions  . Codeine Nausea Only  . Morphine Nausea Only  . Duloxetine Rash    Made me sick  . Oxycodone Hives    Current Facility-Administered Medications  Medication Dose Route Frequency Provider Last Rate Last Admin  . 0.9 %  sodium chloride infusion  250 mL Intravenous PRN Agbata, Tochukwu, MD      . Derrill Memo ON 05/26/2020] albumin human 25 % solution 25 g  25 g Intravenous Q6H Kolluru, Sarath, MD      . ALPRAZolam Duanne Moron) tablet 0.25 mg  0.25 mg Oral QHS PRN Agbata, Tochukwu, MD      . baclofen (LIORESAL) tablet 10 mg  10 mg Oral TID Agbata, Tochukwu, MD   10 mg at 05/09/2020 0953  . cefTRIAXone (ROCEPHIN) 2 g in sodium chloride 0.9 % 100 mL IVPB  2 g Intravenous Q12H Agbata, Tochukwu, MD      .  folic acid (FOLVITE) tablet 1 mg  1 mg Oral Daily Agbata, Tochukwu, MD   1 mg at 05/21/2020 0953  . HYDROmorphone (DILAUDID) injection 1 mg  1 mg Intravenous Q4H PRN Agbata, Tochukwu, MD   1 mg at 05/31/2020 1638  . loratadine (CLARITIN) tablet 10 mg  10 mg Oral Daily Agbata, Tochukwu, MD   10 mg at 06/04/2020 0953  . LORazepam (ATIVAN) injection 2 mg  2 mg Intravenous Q4H PRN Agbata, Tochukwu, MD      . magnesium oxide (MAG-OX) tablet 400 mg  400 mg Oral Daily Agbata, Tochukwu, MD   400 mg at 05/12/2020 0953  . ondansetron (ZOFRAN) tablet 4 mg  4 mg Oral Q6H PRN Agbata, Tochukwu, MD       Or  . ondansetron (ZOFRAN) injection 4 mg  4 mg Intravenous Q6H  PRN Collier Bullock, MD   4 mg at 05/17/2020 0954  . pantoprazole (PROTONIX) EC tablet 40 mg  40 mg Oral Daily Agbata, Tochukwu, MD   40 mg at 05/31/2020 0953  . sodium chloride flush (NS) 0.9 % injection 3 mL  3 mL Intravenous Q12H Agbata, Tochukwu, MD   3 mL at 05/21/2020 0953  . sodium chloride flush (NS) 0.9 % injection 3 mL  3 mL Intravenous PRN Agbata, Tochukwu, MD       Current Outpatient Medications  Medication Sig Dispense Refill  . baclofen (LIORESAL) 10 MG tablet Take 1 tablet (10 mg total) by mouth 3 (three) times daily. 270 tablet 1  . ciprofloxacin (CIPRO) 500 MG tablet Take 500 mg by mouth daily.    Marland Kitchen DOPTELET 20 MG TABS Take 40 mg by mouth daily.    . folic acid (FOLVITE) 1 MG tablet Take 1 mg by mouth daily.     . furosemide (LASIX) 40 MG tablet Take 40 mg by mouth daily.    Marland Kitchen gabapentin (NEURONTIN) 600 MG tablet Take 1 tablet (600 mg total) by mouth 3 (three) times daily. 270 tablet 1  . lactulose (CHRONULAC) 10 GM/15ML solution Take 15 mLs by mouth 3 (three) times daily. Take 15 mls two to three times daily so that you have 3-4 bowel movements per day    . Magnesium 500 MG CAPS Take 500 mg by mouth 2 (two) times a day.    Marland Kitchen omeprazole (PRILOSEC) 20 MG capsule Take 1 capsule (20 mg total) by mouth daily. (Patient taking differently: Take 20  mg by mouth 2 (two) times daily before a meal. ) 90 capsule 3  . oxyCODONE (OXY IR/ROXICODONE) 5 MG immediate release tablet Take 1-2 tablets by mouth every 4 (four) hours as needed. 4-6 hrs prn    . ondansetron (ZOFRAN) 8 MG tablet Take 1 tablet (8 mg total) by mouth every 8 (eight) hours as needed for nausea or vomiting. 20 tablet 0  . spironolactone (ALDACTONE) 25 MG tablet Take 25 mg by mouth daily. (Patient not taking: Reported on 05/18/2020)      OBJECTIVE: Vitals:   05/24/2020 1630 06/02/2020 1714  BP: (!) 117/92   Pulse: 87   Resp: (!) 22 16  Temp:    SpO2: 100%      Body mass index is 26.31 kg/m.    ECOG FS:2 - Symptomatic, <50% confined to bed  General: Ill-appearing, moderate distress secondary to abdominal pain. Eyes: Pink conjunctiva, icteric sclera. HEENT: Normocephalic, moist mucous membranes. Lungs: No audible wheezing or coughing. Heart: Regular rate and rhythm. Abdomen: Distended, tender, peritoneal catheter in place. Musculoskeletal: No edema, cyanosis, or clubbing. Neuro: Alert, answering all questions appropriately. Cranial nerves grossly intact. Skin: No rashes or petechiae noted.  Mildly jaundiced. Psych: Normal affect.   LAB RESULTS:  Lab Results  Component Value Date   NA 130 (L) 05/27/2020   K 4.4 05/29/2020   CL 91 (L) 05/11/2020   CO2 29 05/21/2020   GLUCOSE 112 (H) 05/21/2020   BUN 39 (H) 05/31/2020   CREATININE 1.90 (H) 05/24/2020   CALCIUM 8.5 (L) 05/26/2020   PROT 6.4 (L) 05/26/2020   ALBUMIN 2.7 (L) 05/14/2020   AST 80 (H) 05/27/2020   ALT 20 05/14/2020   ALKPHOS 119 05/11/2020   BILITOT 3.1 (H) 05/09/2020   GFRNONAA 37 (L) 05/12/2020   GFRAA 43 (L) 05/08/2020    Lab Results  Component Value Date   WBC 4.6 05/12/2020  NEUTROABS 3.3 05/24/2020   HGB 14.0 05/20/2020   HCT 40.8 05/21/2020   MCV 91.9 05/24/2020   PLT 19 (LL) 05/28/2020     STUDIES: DG Chest 1 View  Result Date: 05/30/2020 CLINICAL DATA:  Cough EXAM: CHEST  1  VIEW COMPARISON:  04/17/2012 FINDINGS: The heart size and mediastinal contours are within normal limits. Both lungs are clear. The visualized skeletal structures are unremarkable. IMPRESSION: No active disease. Electronically Signed   By: Ulyses Jarred M.D.   On: 05/21/2020 04:46   CT ABDOMEN PELVIS W CONTRAST  Result Date: 05/09/2020 CLINICAL DATA:  Abdominal pain. Acute, nonlocalized. Stage IV liver cancer. EXAM: CT ABDOMEN AND PELVIS WITH CONTRAST TECHNIQUE: Multidetector CT imaging of the abdomen and pelvis was performed using the standard protocol following bolus administration of intravenous contrast. CONTRAST:  36mL OMNIPAQUE IOHEXOL 300 MG/ML  SOLN COMPARISON:  09/10/2019 FINDINGS: Lower chest: Trace right pleural effusion. Hepatobiliary: The liver is cirrhotic. 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 Dominant lesion within the anterior right lobe of liver measures 9.5 by 6.2 by 8.6 cm (volume = 270 cm^3), image 18/2. Previously 9.7 x 5.9 by 8.5 cm (volume = 250 cm^3). 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 New foci of low attenuation within the inferior right hepatic lobe measures 0.8 cm, image 32/2. Pancreas: Unremarkable. No pancreatic ductal dilatation or surrounding inflammatory changes. Spleen: Splenomegaly. Spleen has a cranial caudal dimension of 15.7 cm, image 63/5. Unchanged. Adrenals/Urinary Tract: Normal appearance of the adrenal glands. No kidney mass or hydronephrosis identified. Urinary bladder normal. Stomach/Bowel: Mild wall thickening of the stomach is identified. The appendix is visualized and appears normal. Mild wall thickening of the proximal ascending colon is identified. Vascular/Lymphatic: Aortic atherosclerosis. No aneurysm. Perisplenic varices. Recanalization of the umbilical vein. Upper abdominal adenopathy is again noted. Index portacaval node measures 3.8 x 3.4 cm, image 28/2. Previously 4.3 x 3.5 cm. Porta hepatic node measures 1.5 x 1.7  cm, image 21/2. Previously 2.7 by 2.5 cm. Soft tissue stranding is identified within the retroperitoneum along with multiple small lymph nodes. Similar to previous exam. Index left retroperitoneal lymph node measures 1 cm, image 53/2. Previously 1.7 cm. Aortocaval node measures 1.1 cm, image 41/2. Previously 1.6 cm. No pelvic or inguinal adenopathy. Reproductive: Prostate is unremarkable. Other: Small volume of abdominopelvic ascites is similar to previous exam. Tunneled peritoneal catheter is identified which enters from a right lower quadrant approach with tip terminating in the left lower quadrant. Musculoskeletal: No acute or significant osseous findings. IMPRESSION: 1. Mild wall thickening of the stomach and proximal ascending colon. Nonspecific in the setting of ascites and portal venous hypertension. Correlate for any clinical signs or symptoms of gastritis and or colitis 2. Multifocal hepatocellular carcinoma is identified within a background of cirrhosis. Tumor burden within the liver appears similar to previous exam with a new focal area of low attenuation within the inferior right hepatic lobe. Thrombus within the middle portal vein is identified. 3. Mild decrease in size of upper abdominal adenopathy. 4. Stigmata of portal venous hypertension is identified including ascites, splenomegaly and varices. 5. Similar appearance of soft tissue stranding within the retroperitoneum with multiple small lymph nodes. 6. Aortic atherosclerosis. Aortic Atherosclerosis (ICD10-I70.0). Electronically Signed   By: Kerby Moors M.D.   On: 05/24/2020 07:07    ASSESSMENT: Hepatocellular carcinoma/cholangiocarcinoma now with SBP  PLAN:    1.  Hepatocellular carcinoma/cholangiocarcinoma: Patient receiving treatment at [facility] with combined immunologic and biologic therapy using atezolizumab and bevacizumab.  He reports his last treatment was approximately 2-1/2 weeks ago and he is scheduled for treatment this  coming Wednesday. Given his underlying SBP, treatment will likely need to be delayed but will defer final decision to patient's primary oncologist at [facility]. 2.  Thrombocytopenia: Although patient's platelet count has decreased to 19 this is not far from his baseline in the 30s.  Slight decrease likely secondary to underlying infection.  Continue to monitor daily CBC. 3.  Portal vein thrombosis: Given patient's significant thrombocytopenia as well as receiving Avastin, both of which confer increased bleeding risk, no anticoagulation is recommended.  Monitor. 4.  SBP: On ceftriaxone. 5.  Pain: Patient receiving 1 mg Dilaudid every 4 hours as needed.  Can consider increasing frequency to every 2-3 hours if necessary. 5.  Hyperbilirubinemia: Patient's baseline bilirubin appears to be approximately 1.7-1.9.  Now increased to 3.1.  Continue to monitor daily CMP. 6.  Renal insufficiency: Appreciate nephrology input.  Patient currently receiving albumin.  Appreciate consult, will follow.    Lloyd Huger, MD   05/19/2020 5:43 PM

## 2020-05-25 NOTE — ED Notes (Signed)
MD Tamala Julian made aware of patients critical platelet level.

## 2020-05-25 NOTE — ED Provider Notes (Signed)
[facility] Emergency Department Provider Note  ____________________________________________   First MD Initiated Contact with Patient 05/07/2020 0400     (approximate)  I have reviewed the triage vital signs and the nursing notes.   HISTORY  Chief Complaint Abdominal Pain   HPI REEGAN BOUFFARD is a 64 y.o. 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  Patient also states he feels some chest pain that is in his lower substernal area.  Is also started tonight.  No fevers, vomiting, diarrhea, dysuria, rash, focal extremity pain, headache, earache, sore throat, or other acute complaints.  No clear alleviating aggravating factors including his oxycodone which he has been taking at home but does not seem to help.  He states he gets all of his oncological care at [facility].          Past Medical History:  Diagnosis Date  . Abnormal MRI, lumbar spine (2015) 01/21/2016   FINDINGS:  L3-4: Tiny right foraminal and extra foraminal disc bulge adjacent to but not compressing the L3 nerve lateral to the neural foramen. L4-5: There is a small broad-based disc bulge. There is also hypertrophy of the ligamentum flavum and facet joints, left more than right creating moderately severe spinal stenosis and bilateral lateral recess stenosis, left greater than right. This appe  . Anxiety   . Back ache 05/06/2014  . Cervical pain 05/06/2014  . 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[place]) 06/23/2015  . COPD (chronic obstructive pulmonary disease) ([place])   . De Quervain's tenosynovitis, left 07/07/2017  . Depression   . Dystonia   . Epileptic disorder  ([place]) 06/23/2015  . Extremity pain 05/06/2014  . Febrile seizures ([place])    child  . Foot pain 09/11/2014  . Gastric ulcer 06/23/2015  . Gout   . H/O neoplasm 06/23/2015  . Head injury 06/23/2015  . Hemorrhoids   . Hepatic cirrhosis ([place]) 06/23/2015  . Hepatitis B   . History of GI bleed   . Leukopenia   . Liver cancer ([place]) 08/2018  . Neurogenic pain 01/21/2016  . Nodule of upper lobe of left lung 12/02/2017  . Non-small cell cancer of left lung ([place]) 2019   Rad tx's.   . Obstructive sleep apnea 06/23/2015  . 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 Osteoarthritis of spine with radiculopathy, lumbosacral region 06/23/2015  . Peripheral neuropathy 06/23/2015  . Peripheral neuropathy (Alcoholic) 23/76/2831  . Personal history of tobacco use, presenting hazards to health 01/09/2016  . Pulse irregularity   . Seizures ([place])   . Spinal stenosis   . Splenomegaly 05/30/2013  . Thrombocytopenia ([place]) 06/23/2015  . Uncomplicated opioid dependence ([place]) 06/23/2015    Patient Active Problem List   Diagnosis Date Noted  . Pharmacologic therapy 02/27/2020  . Disorder of skeletal system 02/27/2020  . Problems influencing health status 02/27/2020  . Fever and chills 11/02/2019  . Epistaxis 11/02/2019  . Hepatocellular, cholangiocarcinoma combined ([place]) 09/26/2019  . 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 Cancer associated pain 01/04/2019  . Chronic abdominal pain, right upper quadrant (RUQ) 10/13/2018  . Cholangiocarcinoma ([place]) 07/07/2018  . Liver mass, right lobe 04/04/2018  . Goals of care, counseling/discussion 12/03/2017  .  Nodule of upper lobe of left lung 12/02/2017  . History of brain tumor 07/07/2017  . De Quervain's tenosynovitis, left 07/07/2017  . Chronic thumb pain (Left) 06/30/2017  . Chronic pain syndrome 10/05/2016  . Neurogenic pain 01/21/2016  . Musculoskeletal pain 01/21/2016  . Chronic lower extremity pain  (Secondary area of Pain) (Bilateral) (L>R) 01/21/2016  . Chronic upper extremity pain (Fourth area of Pain) (Bilateral) (R>L) 01/21/2016  . Abnormal MRI, lumbar spine (2015) 01/21/2016  . Lumbar central spinal stenosis (Severe at L4-5; L5-S1) 01/21/2016  . Lumbar lateral recess stenosis (Bilateral L>R at L4-5; Left sided at L5-S1) 01/21/2016  . Lumbar foraminal stenosis (Left L5-S1) 01/21/2016  . Lumbar facet hypertrophy (L4-5 & L5-S1 L>R) 01/21/2016  . Abnormal MRI, cervical spine (2014) 01/21/2016  . Cervical foraminal stenosis (Right sided C3-4 & C4-5) (Left-sided C5-6 and C7-T1) (Bilateral C6-7) 01/21/2016  . Personal history of tobacco use, presenting hazards to health 01/09/2016  . Opioid-induced constipation (OIC) 11/18/2015  . Long term prescription opiate use 09/22/2015  . Encounter for chronic pain management 09/22/2015  . Radicular pain of shoulder (Bilateral) (R>L) 09/22/2015  . Encounter for therapeutic drug level monitoring 06/23/2015  . Long term current use of opiate analgesic 06/23/2015  . Opiate use (7.5 MME/Day) 06/23/2015  . Lumbar spondylosis 06/23/2015  . Chronic low back pain (Primary Area of Pain) (Bilateral) (L>R) 06/23/2015  . IVP/radiological dye Allergy 06/23/2015    Class: Chronic  . Thrombocytopenia ([place]) 06/23/2015  . History of alcoholism 06/23/2015  . Depression 06/23/2015  . Dystonia 06/23/2015  . Gastric ulcer 06/23/2015  . HCV (hepatitis C virus) 06/23/2015  . H/O neoplasm 06/23/2015  . BP (high blood pressure) 06/23/2015  . Decreased leukocytes 06/23/2015  . Hepatic cirrhosis ([place]) 06/23/2015  . Current tobacco use 06/23/2015  . Has a tremor 06/23/2015  . Osteoarthritis of spine with radiculopathy, lumbosacral region 06/23/2015  . Peripheral neuropathy (Alcoholic) 65/78/4696  . Chronic neck pain (Third area of Pain) (Bilateral) (R>L) 06/23/2015  . Cervical spondylosis 06/23/2015  . Obstructive sleep apnea 06/23/2015  . History of panic  attacks 06/23/2015  . GERD (gastroesophageal reflux disease) 06/23/2015  . Chronic constipation 06/23/2015  . 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[place]) 06/23/2015  . Leukopenia 06/23/2015    Class: History of  . Lumbar facet syndrome (Bilateral) (L>R) 06/23/2015  . Chronic lumbar radicular pain (Right S1 and Left L5) (Bilateral) (L>R) 06/23/2015  . Cervical radicular pain (Bilateral) (R>L) 06/23/2015  . Chronic obstructive pulmonary disease (COPD) ([place]) 06/23/2015  . Head injury 06/23/2015    Class: History of  . Epileptic disorder ([place]) 06/23/2015  . Carpal tunnel syndrome 01/07/2015  . Bilateral carpal tunnel syndrome 01/07/2015  . Foot pain, right 09/11/2014  . Difficulty in walking 05/06/2014  . Loss of feeling or sensation 05/06/2014  . Absence of sensation 05/06/2014  . Back pain 05/06/2014  . Splenomegaly 05/30/2013    Past Surgical History:  Procedure Laterality Date  . ESOPHAGOGASTRODUODENOSCOPY  11/2013  . ESOPHAGOGASTRODUODENOSCOPY (EGD) WITH PROPOFOL N/A 06/23/2018   Procedure: ESOPHAGOGASTRODUODENOSCOPY (EGD) WITH PROPOFOL;  Surgeon: Manya Silvas, MD;  Location[facility] ENDOSCOPY;  Service: Endoscopy;  Laterality: N/A;  . surgery for cervical neck fracture    . TOOTH EXTRACTION Right 09/28/2016    Prior to Admission medications   Medication Sig Start Date End Date Taking? Authorizing Provider  ALPRAZolam (XANAX) 0.25 MG tablet Take 1 tablet (0.25 mg total) by mouth at bedtime as needed for anxiety or sleep. 08/08/19   [name], MD  baclofen (LIORESAL) 10 MG tablet Take 1 tablet (10 mg total) by mouth 3 (three) times daily. 02/27/20 08/25/20  Milinda Pointer, MD  cetirizine (ZYRTEC) 10 MG tablet Take 10 mg by mouth daily. 11/09/17   [provider]  folic acid (FOLVITE) 1 MG tablet Take 1 mg by mouth daily.     [provider]  gabapentin (NEURONTIN) 600 MG tablet Take 1 tablet (600 mg total) by mouth 3 (three) times daily. 02/27/20 08/25/20  Milinda Pointer, MD  Magnesium 500 MG CAPS Take 500 mg by mouth 2 (two) times a day.    [provider]  omeprazole (PRILOSEC) 20 MG capsule Take 1 capsule (20 mg total) by mouth daily. Patient taking differently: Take 20 mg by mouth 2 (two) times daily before a meal.  11/28/19 11/27/20  Milinda Pointer, MD  ondansetron (ZOFRAN) 8 MG tablet Take 1 tablet (8 mg total) by mouth every 8 (eight) hours as needed for nausea or vomiting. 01/25/19   [name], MD  oxyCODONE (OXY IR/ROXICODONE) 5 MG immediate release tablet Take 1-2 tablets by mouth every 4 (four) hours as needed. 4-6 hrs prn 12/14/19   [provider]  prochlorperazine (COMPAZINE) 10 MG tablet Take 1 tablet (10 mg total) by mouth every 6 (six) hours as needed for nausea or vomiting. 01/25/19   [name], MD  bisacodyl (DULCOLAX) 5 MG EC tablet Take 1 tablet (5 mg total) by mouth daily as needed for moderate constipation. 06/07/19 08/22/19  Gillis Santa, MD    Allergies Codeine, Morphine, Oxycodone, and Duloxetine  Family History  Problem Relation Age of Onset  . Stroke Mother   . Cancer Father 32       prostate cancer  . Heart disease Father   . Stroke Father   . Cancer Brother 60       Lung Cancer  . Cancer Maternal Aunt        breast cancer   . Cancer Cousin        breast cancer    Social History Social History   Tobacco Use  . Smoking status: Former Smoker    Packs/day: 0.50    Years: 35.00    Pack years: 17.50    Types: Cigarettes    Start date: 10/07/2017    Quit date: 10/07/2017    Years since quitting: 2.6  . Smokeless tobacco: Current User    Types: Snuff  Vaping Use  . Vaping Use: Every day  Substance Use Topics  . Alcohol use: No    Alcohol/week: 0.0 standard drinks    Comment: Pt used to drink alcohol for 30 years, states that he has been alcohol free since July 2013...  . Drug use: No    Review of Systems  Review of Systems  Constitutional: Negative for chills and fever.  HENT: Positive  for congestion. Negative for sore throat.   Eyes: Negative for pain.  Respiratory: Positive for cough. Negative for stridor.   Cardiovascular: Positive for chest pain.  Gastrointestinal: Positive for abdominal pain. Negative for vomiting.  Genitourinary: Negative for dysuria.  Musculoskeletal: Negative for myalgias.  Skin: Negative for rash.  Neurological: Negative for seizures, loss of consciousness and headaches.  Psychiatric/Behavioral: Negative for suicidal ideas.  All other systems reviewed and are negative.     ____________________________________________   PHYSICAL EXAM:  VITAL SIGNS: ED Triage Vitals [05/09/2020 0400]  Enc Vitals Group     BP      Pulse  Resp      Temp      Temp src      SpO2      Weight 194 lb (88 kg)     Height 6' (1.829 m)     Head Circumference      Peak Flow      Pain Score 6     Pain Loc      Pain Edu?      Excl. in [place]?    Vitals:   06/05/2020 0404  BP: 103/72  Pulse: 89  Resp: 18  Temp: 98 F (36.7 C)  SpO2: 96%   Physical Exam Vitals and nursing note reviewed.  Constitutional:      Appearance: He is well-developed. He is ill-appearing.  HENT:     Head: Normocephalic and atraumatic.     Right Ear: External ear normal.     Left Ear: External ear normal.     Nose: Nose normal.     Mouth/Throat:     Mouth: Mucous membranes are dry.  Eyes:     Conjunctiva/sclera: Conjunctivae normal.  Cardiovascular:     Rate and Rhythm: Normal rate and regular rhythm.     Heart sounds: No murmur heard.   Pulmonary:     Effort: Pulmonary effort is normal. No respiratory distress.     Breath sounds: Normal breath sounds.  Abdominal:     Palpations: Abdomen is soft. There is fluid wave and hepatomegaly.     Tenderness: There is generalized abdominal tenderness. There is no right CVA tenderness or left CVA tenderness.  Musculoskeletal:     Cervical back: Neck supple.  Skin:    General: Skin is warm and dry.     Capillary Refill:  Capillary refill takes less than 2 seconds.     Coloration: Skin is jaundiced.  Neurological:     Mental Status: He is alert and oriented to person, place, and time.  Psychiatric:        Mood and Affect: Mood normal.      ____________________________________________   LABS (all labs ordered are listed, but only abnormal results are displayed)  Labs Reviewed  CBC WITH DIFFERENTIAL/PLATELET - Abnormal; Notable for the following components:      Result Value   RDW 18.9 (*)    Platelets 19 (*)    Lymphs Abs 0.4 (*)    All other components within normal limits  COMPREHENSIVE METABOLIC PANEL - Abnormal; Notable for the following components:   Sodium 130 (*)    Chloride 91 (*)    Glucose, Bld 112 (*)    BUN 39 (*)    Creatinine, Ser 1.90 (*)    Calcium 8.5 (*)    Total Protein 6.4 (*)    Albumin 2.7 (*)    AST 80 (*)    Total Bilirubin 3.1 (*)    GFR calc non Af Amer 37 (*)    GFR calc Af Amer 43 (*)    All other components within normal limits  BODY FLUID CELL COUNT WITH DIFFERENTIAL - Abnormal; Notable for the following components:   Color, Fluid YELLOW (*)    All other components within normal limits  CULTURE, BLOOD (ROUTINE X 2)  CULTURE, BLOOD (ROUTINE X 2)  BODY FLUID CULTURE  URINE CULTURE  LIPASE, BLOOD  AMMONIA  LACTIC ACID, PLASMA  PROTEIN, PLEURAL OR PERITONEAL FLUID  ALBUMIN, PLEURAL OR PERITONEAL FLUID  LACTIC ACID, PLASMA  TRIGLYCERIDES, BODY FLUIDS  TOTAL BILIRUBIN, BODY FLUID  URINALYSIS, COMPLETE (  UACMP) WITH MICROSCOPIC  PROTEIN, BODY FLUID (OTHER)  TROPONIN I (HIGH SENSITIVITY)  TROPONIN I (HIGH SENSITIVITY)   ____________________________________________  EKG  Sinus rhythm with a ventricular rate of 95, normal axis, unremarkable intervals, poor tracing in lead II with a Q wave in 2.  No other clear evidence of acute ischemia. ____________________________________________  RADIOLOGY  ED MD interpretation: Chest x-ray has no focal  consolidations, effusion, or significant edema.  Official radiology report(s): DG Chest 1 View  Result Date: 05/28/2020 CLINICAL DATA:  Cough EXAM: CHEST  1 VIEW COMPARISON:  04/17/2012 FINDINGS: The heart size and mediastinal contours are within normal limits. Both lungs are clear. The visualized skeletal structures are unremarkable. IMPRESSION: No active disease. Electronically Signed   By: Ulyses Jarred M.D.   On: 05/08/2020 04:46   CT ABDOMEN PELVIS W CONTRAST  Result Date: 05/07/2020 CLINICAL DATA:  Abdominal pain. Acute, nonlocalized. Stage IV liver cancer. EXAM: CT ABDOMEN AND PELVIS WITH CONTRAST TECHNIQUE: Multidetector CT imaging of the abdomen and pelvis was performed using the standard protocol following bolus administration of intravenous contrast. CONTRAST:  77mL OMNIPAQUE IOHEXOL 300 MG/ML  SOLN COMPARISON:  09/10/2019 FINDINGS: Lower chest: Trace right pleural effusion. Hepatobiliary: The liver is cirrhotic. 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 Dominant lesion within the anterior right lobe of liver measures 9.5 by 6.2 by 8.6 cm (volume = 270 cm^3), image 18/2. Previously 9.7 x 5.9 by 8.5 cm (volume = 250 cm^3). 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 New foci of low attenuation within the inferior right hepatic lobe measures 0.8 cm, image 32/2. Pancreas: Unremarkable. No pancreatic ductal dilatation or surrounding inflammatory changes. Spleen: Splenomegaly. Spleen has a cranial caudal dimension of 15.7 cm, image 63/5. Unchanged. Adrenals/Urinary Tract: Normal appearance of the adrenal glands. No kidney mass or hydronephrosis identified. Urinary bladder normal. Stomach/Bowel: Mild wall thickening of the stomach is identified. The appendix is visualized and appears normal. Mild wall thickening of the proximal ascending colon is identified. Vascular/Lymphatic: Aortic atherosclerosis. No aneurysm. Perisplenic varices. Recanalization of the umbilical vein.  Upper abdominal adenopathy is again noted. Index portacaval node measures 3.8 x 3.4 cm, image 28/2. Previously 4.3 x 3.5 cm. Porta hepatic node measures 1.5 x 1.7 cm, image 21/2. Previously 2.7 by 2.5 cm. Soft tissue stranding is identified within the retroperitoneum along with multiple small lymph nodes. Similar to previous exam. Index left retroperitoneal lymph node measures 1 cm, image 53/2. Previously 1.7 cm. Aortocaval node measures 1.1 cm, image 41/2. Previously 1.6 cm. No pelvic or inguinal adenopathy. Reproductive: Prostate is unremarkable. Other: Small volume of abdominopelvic ascites is similar to previous exam. Tunneled peritoneal catheter is identified which enters from a right lower quadrant approach with tip terminating in the left lower quadrant. Musculoskeletal: No acute or significant osseous findings. IMPRESSION: 1. Mild wall thickening of the stomach and proximal ascending colon. Nonspecific in the setting of ascites and portal venous hypertension. Correlate for any clinical signs or symptoms of gastritis and or colitis 2. Multifocal hepatocellular carcinoma is identified within a background of cirrhosis. Tumor burden within the liver appears similar to previous exam with a new focal area of low attenuation within the inferior right hepatic lobe. Thrombus within the middle portal vein is identified. 3. Mild decrease in size of upper abdominal adenopathy. 4. Stigmata of portal venous hypertension is identified including ascites, splenomegaly and varices. 5. Similar appearance of soft tissue stranding within the retroperitoneum with multiple small lymph nodes. 6. Aortic atherosclerosis. Aortic Atherosclerosis (ICD10-I70.0). Electronically  Signed   By: Kerby Moors M.D.   On: 05/14/2020 07:07    ____________________________________________   PROCEDURES  Procedure(s) performed (including Critical Care):  Procedures   ____________________________________________   INITIAL IMPRESSION /  ASSESSMENT AND PLAN / ED COURSE        Patient presents with [place] to history exam for assessment of multiple symptoms with acute on subacute abdominal pain seemingly most bothersome to the patient this evening.  With regard to patient's report of shortness of breath and cough and certainly possible his viral bronchitis as there is no evidence of pneumonia on chest x-ray patient has no fever or elevation of blood cell count on arrival to suggest occult pneumonia.  Regarding his chest pain it does seem to be in the very bottom of his sternum is aggravated by palpation of his abdomen and given reassuring EKG and nonelevated opponent of this patient for ACS at this time.  No evidence of pneumothorax or other acute process on chest x-ray.  With regard to patient's abdominal pain concern for SBP given PMH count >250. CT does not show evidence of perforated viscous, SBO, or diverticulitis. CT does show findings concistent with portal hypertension w/ portal vein thrombus. Anti-coagulation deferred given thrombocytopenia. No other acute findings on CT A/P.   Given concern for SBP with significant AKI, I did reach out to [facility] where patient is currently receiving his cancer care.  Per [facility] transfer center they will reach out to [place] again if patient is accepted.  Otherwise it would be reasonable to admit patient here she does not have any acute surgical process.  Care of patient signed over to oncoming provider at approximately 0 [phone].              ____________________________________________   FINAL CLINICAL IMPRESSION(S) / ED DIAGNOSES  Final diagnoses:  AKI (acute kidney injury) ([place])  SBP (spontaneous bacterial peritonitis) ([place])  Thrombocytopenia (HCC)  Hepatocellular carcinoma (HCC)  Hyponatremia  Portal vein thrombosis    Medications  oxyCODONE (Oxy IR/ROXICODONE) immediate release tablet 5-10 mg (has no administration in time range)  HYDROmorphone (DILAUDID) injection  0.5 mg (0.5 mg Intravenous Given 05/18/2020 0419)  diphenhydrAMINE (BENADRYL) injection 50 mg (50 mg Intravenous Given 05/20/2020 0419)  lactated ringers bolus 1,000 mL (1,000 mLs Intravenous New Bag/Given 05/31/2020 0556)  ondansetron (ZOFRAN) injection 4 mg (4 mg Intravenous Given 05/13/2020 0651)  cefTRIAXone (ROCEPHIN) 2 g in sodium chloride 0.9 % 100 mL IVPB (2 g Intravenous New Bag/Given 05/07/2020 0650)  iohexol (OMNIPAQUE) 300 MG/ML solution 75 mL (75 mLs Intravenous Contrast Given 05/23/2020 8144)     ED Discharge Orders    None       Note:  This document was prepared using Dragon voice recognition software and may include unintentional dictation errors.   Lucrezia Starch, MD 05/15/2020 [phone]

## 2020-05-25 NOTE — ED Provider Notes (Signed)
Patient received in signout from Dr. Tamala Julian.  Dr. Tamala Julian had initiated transfer to [facility] as is where the patient receives most of his care but they are at capacity.  Patient has been placed on wait list.  As the patient is hemodynamically stable, will discuss with hospitalist for admission to this facility.   Merlyn Lot, MD 05/11/2020 [phone]

## 2020-05-26 LAB — BASIC METABOLIC PANEL
Anion gap: 10 (ref 5–15)
BUN: 39 mg/dL — ABNORMAL HIGH (ref 8–23)
CO2: 27 mmol/L (ref 22–32)
Calcium: 8.5 mg/dL — ABNORMAL LOW (ref 8.9–10.3)
Chloride: 93 mmol/L — ABNORMAL LOW (ref 98–111)
Creatinine, Ser: 1.2 mg/dL (ref 0.61–1.24)
GFR calc Af Amer: >60 mL/min (ref >60)
GFR calc non Af Amer: >60 mL/min (ref >60)
Glucose, Bld: 115 mg/dL — ABNORMAL HIGH (ref 70–99)
Potassium: 4.5 mmol/L (ref 3.5–5.1)
Sodium: 130 mmol/L — ABNORMAL LOW (ref 135–145)

## 2020-05-26 LAB — CBC
HCT: 33.5 % — ABNORMAL LOW (ref 39.0–52.0)
Hemoglobin: 11.1 g/dL — ABNORMAL LOW (ref 13.0–17.0)
MCH: 31.5 pg (ref 26.0–34.0)
MCHC: 33.1 g/dL (ref 30.0–36.0)
MCV: 95.2 fL (ref 80.0–100.0)
Platelets: 19 10*3/uL — CL (ref 150–400)
RBC: 3.52 MIL/uL — ABNORMAL LOW (ref 4.22–5.81)
RDW: 18.9 % — ABNORMAL HIGH (ref 11.5–15.5)
WBC: 5.9 10*3/uL (ref 4.0–10.5)
nRBC: 0 % (ref 0.0–0.2)

## 2020-05-26 LAB — PATHOLOGIST SMEAR REVIEW

## 2020-05-26 LAB — HIV ANTIBODY (ROUTINE TESTING W REFLEX): HIV Screen 4th Generation wRfx: NONREACTIVE

## 2020-05-26 MED ORDER — COLLAGENASE 250 UNIT/GM EX OINT
TOPICAL_OINTMENT | Freq: Every day | CUTANEOUS | Status: AC
  Administered 2020-05-29 – 2020-06-14 (×4): 1 via TOPICAL
  Filled 2020-05-26 (×2): qty 30

## 2020-05-26 MED ORDER — LACTULOSE 10 GM/15ML PO SOLN
20.0000 g | Freq: Two times a day (BID) | ORAL | Status: DC
  Administered 2020-05-26 – 2020-06-17 (×27): 20 g via ORAL
  Filled 2020-05-26 (×34): qty 30

## 2020-05-26 MED ORDER — SODIUM CHLORIDE 0.9 % IV SOLN
2.0000 g | INTRAVENOUS | Status: DC
  Administered 2020-05-27 – 2020-05-28 (×2): 2 g via INTRAVENOUS
  Filled 2020-05-26 (×2): qty 20
  Filled 2020-05-26: qty 2

## 2020-05-26 NOTE — Progress Notes (Signed)
[facility]  ROUNDING NOTE   Subjective:   Patient complains of abdominal pain.   Objective:  Vital signs in last 24 hours:  Temp:  [97.9 F (36.6 C)-99.6 F (37.6 C)] 97.9 F (36.6 C) (09/20 1116) Pulse Rate:  [83-96] 92 (09/20 1116) Resp:  [11-22] 19 (09/20 1116) BP: (115-145)/(57-126) 118/60 (09/20 1116) SpO2:  [88 %-100 %] 98 % (09/20 1116) Weight:  [87.3 kg-87.9 kg] 87.3 kg (09/20 0439)  Weight change: -0.098 kg Filed Weights   05/14/2020 0400 05/29/2020 1813 05/26/20 0439  Weight: 88 kg 87.9 kg 87.3 kg    Intake/Output: I/O last 3 completed shifts: In: 1083.6 [P.O.:60; I.V.:23.6; IV Piggyback:1000] Out: 100 [Urine:100]   Intake/Output this shift:  Total I/O In: 3 [I.V.:3] Out: 225 [Urine:225]  Physical Exam: General: NAD,   Head: Normocephalic, atraumatic. Moist oral mucosal membranes  Eyes: Anicteric, PERRL  Neck: Supple, trachea midline  Lungs:  Clear to auscultation  Heart: Regular rate and rhythm  Abdomen:  +distended,   Extremities:  + peripheral edema.  Neurologic: Nonfocal, moving all four extremities  Skin: No lesions        Basic Metabolic Panel: Recent Labs  Lab 06/03/2020 0402 05/26/20 0632  NA 130* 130*  K 4.4 4.5  CL 91* 93*  CO2 29 27  GLUCOSE 112* 115*  BUN 39* 39*  CREATININE 1.90* 1.20  CALCIUM 8.5* 8.5*    Liver Function Tests: Recent Labs  Lab 05/07/2020 0402  AST 80*  ALT 20  ALKPHOS 119  BILITOT 3.1*  PROT 6.4*  ALBUMIN 2.7*   Recent Labs  Lab 05/21/2020 0402  LIPASE 44   Recent Labs  Lab 05/09/2020 0413  AMMONIA 25    CBC: Recent Labs  Lab 05/19/2020 0402 05/26/20 0632  WBC 4.6 5.9  NEUTROABS 3.3  --   HGB 14.0 11.1*  HCT 40.8 33.5*  MCV 91.9 95.2  PLT 19* 19*    Cardiac Enzymes: No results for input(s): CKTOTAL, CKMB, CKMBINDEX, TROPONINI in the last 168 hours.  BNP: Invalid input(s): POCBNP  CBG: No results for input(s): GLUCAP in the last 168 hours.  Microbiology: Results for  orders placed or performed during the hospital encounter of 05/12/2020  Peritoneal Fluid culture ( includes gram stain)     Status: None (Preliminary result)   Collection Time: 05/08/2020  4:09 AM   Specimen: Peritoneal Washings; Peritoneal Fluid  Result Value Ref Range Status   Specimen Description   Final    PERITONEAL Performed at [facility], [address] [place], [place] [address]    Special Requests   Final    Immunocompromised Performed at [facility], [address] [place], [place] [address]    Gram Stain   Final    RARE WBC PRESENT, PREDOMINANTLY MONONUCLEAR NO ORGANISMS SEEN    Culture   Final    CULTURE REINCUBATED FOR BETTER GROWTH Performed at [facility], [place] [address] [place], [place] [address]    Report Status PENDING  Incomplete  Blood culture (routine x 2)     Status: None (Preliminary result)   Collection Time: 05/07/2020  4:14 AM   Specimen: BLOOD  Result Value Ref Range Status   Specimen Description BLOOD RIGHT ANTECUBITAL  Final   Special Requests   Final    BOTTLES DRAWN AEROBIC AND ANAEROBIC Blood Culture adequate volume   Culture   Final    NO GROWTH 1 DAY Performed at [facility], [address] [place], [place] [address]    Report Status PENDING  Incomplete  Blood culture (routine x 2)     Status: None (Preliminary result)   Collection Time: 05/09/2020  4:14 AM   Specimen: BLOOD  Result Value Ref Range Status   Specimen Description BLOOD BLOOD RIGHT HAND  Final   Special Requests   Final    BOTTLES DRAWN AEROBIC AND ANAEROBIC Blood Culture results may not be optimal due to an excessive volume of blood received in culture bottles   Culture   Final    NO GROWTH 1 DAY Performed at [facility], [address] [place], [place] [address]    Report Status PENDING  Incomplete  SARS Coronavirus 2 by RT PCR (hospital order, performed in [place] hospital lab) Nasopharyngeal Nasopharyngeal Swab      Status: None   Collection Time: 05/16/2020  8:31 AM   Specimen: Nasopharyngeal Swab  Result Value Ref Range Status   SARS Coronavirus 2 NEGATIVE NEGATIVE Final    Comment: (NOTE) SARS-CoV-2 target nucleic acids are NOT DETECTED.  The SARS-CoV-2 RNA is generally detectable in upper and lower respiratory specimens during the acute phase of infection. The lowest concentration of SARS-CoV-2 viral copies this assay can detect is 250 copies / mL. A negative result does not preclude SARS-CoV-2 infection and should not be used as the sole basis for treatment or other patient management decisions.  A negative result may occur with improper specimen collection / handling, submission of specimen other than nasopharyngeal swab, presence of viral mutation(s) within the areas targeted by this assay, and inadequate number of viral copies (<250 copies / mL). A negative result must be combined with clinical observations, patient history, and epidemiological information.  Fact Sheet for Patients:   StrictlyIdeas.no  Fact Sheet for Healthcare Providers: BankingDealers.co.za  This test is not yet approved or  cleared by the [place] FDA and has been authorized for detection and/or diagnosis of SARS-CoV-2 by FDA under an Emergency Use Authorization (EUA).  This EUA will remain in effect (meaning this test can be used) for the duration of the COVID-19 declaration under Section 564(b)(1) of the Act, 21 U.S.C. section 360bbb-3(b)(1), unless the authorization is terminated or revoked sooner.  Performed at [facility], [address] [place], [place] [address]     Coagulation Studies: No results for input(s): LABPROT, INR in the last 72 hours.  Urinalysis: No results for input(s): COLORURINE, LABSPEC, PHURINE, GLUCOSEU, HGBUR, BILIRUBINUR, KETONESUR, PROTEINUR, UROBILINOGEN, NITRITE, LEUKOCYTESUR in the last 72 hours.  Invalid input(s):  APPERANCEUR    Imaging: DG Chest 1 View  Result Date: 06/04/2020 CLINICAL DATA:  Cough EXAM: CHEST  1 VIEW COMPARISON:  04/17/2012 FINDINGS: The heart size and mediastinal contours are within normal limits. Both lungs are clear. The visualized skeletal structures are unremarkable. IMPRESSION: No active disease. Electronically Signed   By: Ulyses Jarred M.D.   On: 05/14/2020 04:46   CT ABDOMEN PELVIS W CONTRAST  Result Date: 05/24/2020 CLINICAL DATA:  Abdominal pain. Acute, nonlocalized. Stage IV liver cancer. EXAM: CT ABDOMEN AND PELVIS WITH CONTRAST TECHNIQUE: Multidetector CT imaging of the abdomen and pelvis was performed using the standard protocol following bolus administration of intravenous contrast. CONTRAST:  64mL OMNIPAQUE IOHEXOL 300 MG/ML  SOLN COMPARISON:  09/10/2019 FINDINGS: Lower chest: Trace right pleural effusion. Hepatobiliary: The liver is cirrhotic. 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 Dominant lesion within the anterior right lobe of liver measures 9.5 by 6.2 by 8.6 cm (volume =  270 cm^3), image 18/2. Previously 9.7 x 5.9 by 8.5 cm (volume = 250 cm^3). 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 New foci of low attenuation within the inferior right hepatic lobe measures 0.8 cm, image 32/2. Pancreas: Unremarkable. No pancreatic ductal dilatation or surrounding inflammatory changes. Spleen: Splenomegaly. Spleen has a cranial caudal dimension of 15.7 cm, image 63/5. Unchanged. Adrenals/Urinary Tract: Normal appearance of the adrenal glands. No kidney mass or hydronephrosis identified. Urinary bladder normal. Stomach/Bowel: Mild wall thickening of the stomach is identified. The appendix is visualized and appears normal. Mild wall thickening of the proximal ascending colon is identified. Vascular/Lymphatic: Aortic atherosclerosis. No aneurysm. Perisplenic varices. Recanalization of the umbilical vein. Upper abdominal adenopathy is again noted. Index  portacaval node measures 3.8 x 3.4 cm, image 28/2. Previously 4.3 x 3.5 cm. Porta hepatic node measures 1.5 x 1.7 cm, image 21/2. Previously 2.7 by 2.5 cm. Soft tissue stranding is identified within the retroperitoneum along with multiple small lymph nodes. Similar to previous exam. Index left retroperitoneal lymph node measures 1 cm, image 53/2. Previously 1.7 cm. Aortocaval node measures 1.1 cm, image 41/2. Previously 1.6 cm. No pelvic or inguinal adenopathy. Reproductive: Prostate is unremarkable. Other: Small volume of abdominopelvic ascites is similar to previous exam. Tunneled peritoneal catheter is identified which enters from a right lower quadrant approach with tip terminating in the left lower quadrant. Musculoskeletal: No acute or significant osseous findings. IMPRESSION: 1. Mild wall thickening of the stomach and proximal ascending colon. Nonspecific in the setting of ascites and portal venous hypertension. Correlate for any clinical signs or symptoms of gastritis and or colitis 2. Multifocal hepatocellular carcinoma is identified within a background of cirrhosis. Tumor burden within the liver appears similar to previous exam with a new focal area of low attenuation within the inferior right hepatic lobe. Thrombus within the middle portal vein is identified. 3. Mild decrease in size of upper abdominal adenopathy. 4. Stigmata of portal venous hypertension is identified including ascites, splenomegaly and varices. 5. Similar appearance of soft tissue stranding within the retroperitoneum with multiple small lymph nodes. 6. Aortic atherosclerosis. Aortic Atherosclerosis (ICD10-I70.0). Electronically Signed   By: Kerby Moors M.D.   On: 05/16/2020 07:07     Medications:   . sodium chloride    . sodium chloride Stopped (06/01/2020 2338)  . albumin human 25 g (05/26/20 1124)  . [START ON 05/27/2020] cefTRIAXone (ROCEPHIN)  IV     . baclofen  10 mg Oral TID  . collagenase   Topical Daily  . folic  acid  1 mg Oral Daily  . lactulose  20 g Oral BID  . loratadine  10 mg Oral Daily  . magnesium oxide  400 mg Oral Daily  . pantoprazole  40 mg Oral Daily  . sodium chloride flush  3 mL Intravenous Q12H   sodium chloride, sodium chloride, ALPRAZolam, HYDROmorphone (DILAUDID) injection, LORazepam, ondansetron **OR** ondansetron (ZOFRAN) IV, sodium chloride flush  Assessment/ Plan:  Mr. John [name] is a 64 y.o. white male with cholangiocarcinoma and hepatocellular carcinoma, seizure disorder, neuropathy, hepatic cirrhosis, hepatitis B, gout, peptic ulcer disease, COPD, depression and anxiety who was admitted to [facility] on 05/09/2020 for SBP (spontaneous bacterial peritonitis) ([place]) [K65.2]  1. Acute renal failure: baseline creatinine of 0.7 on 05/07/20.  Concern for prerenal azotemia verus acute hepatorenal syndrome.  IV contrast exposure on 9/19.  Patient may be intravascularly depleted and edema is due to hypoalbuminemia.  - Do not recommend diuretics at this time.  -  Continue IV albumin  - midodrine if blood pressure is low.   2. Hypertension: holding spironolactone and furosemide, blood pressure at goal  3. Hyponatremia: secondary to hepatic failure.   4. Spontaneous Bacterial Peritonitis: cultures with no growth - empiric ceftriaxone  5. Thrombocytopenia: with portal vein thrombosis. Secondary to liver failure.   Kidney function continues to improve. Nephrology will continue to monitor patient peripherally. Please call with any questions.    LOS: 1 [facility] 9/20/202112:04 PM

## 2020-05-26 NOTE — Consult Note (Signed)
[facility] Nurse Consult Note: Reason for Consult: Chronic, nonhealing RLQ wound. Patient states that it is a nonhealing puncture (needle) wound. Wound type: full thickness Pressure Injury POA: N/A Measurement: 1cm x 2cm with depth unable to be determined due to the presence of a nonviable wound base (yellow/white slough).  A partial thickness wound in the sternal area is nearly reepithelialized and measures 2cm x 1cm x 0.1cm Wound bed:As described above Drainage (amount, consistency, odor) Scant serous to sternal wound.  Yellow exudate on RLQ old dressing but I am unsure if it is drainage from the superiorly placed catheter. Periwound: intact Dressing procedure/placement/frequency: Topical care orders are provided for collagenase to the RLQ wound daily x 21 days. A silicone foam dressing is to be used over the sternal wound. Turning and repositioning is in place; guidance provided to place a prophylactic foam dressing to the sacrum and to float the bilateral heels.  [place] nursing team will not follow, but will remain available to this patient, the nursing and medical teams.  Please re-consult if needed. Thanks, Maudie Flakes, MSN, RN, [place], Ar[facility] Abbott  Pager# [phone]

## 2020-05-26 NOTE — Progress Notes (Addendum)
PROGRESS NOTE    John [name]  AOZ:[id number] DOB: 04/28/1956 DOA: 05/30/2020 PCP: Cletis [place], MD  Outpatient Specialists: [facility]    Brief Narrative:  John [name] is a 64 y.o. 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  Patient states that he usually has pain in his abdomen but over the last 2 days it become so severe and he rates it a 10 x 10 in intensity at its worst.  Pain is diffuse and intermittent, occurring in spasms.  He denies having any fever, no vomiting, no diarrhea, no dysuria, no melena stools, no hematemesis or hematochezia. Patient receives all his oncological care at [facility] and had requested transfer to [facility].  They have no beds available at this time and patient will be admitted to [facility] pending bed availability at [facility] show sodium 130, potassium 4.4, chloride 91, bicarb 29, BUN 39, creatinine 1.9, calcium 8.5, alkaline phosphatase 119, AST 80, ALT 20, total protein 6.4, ammonia 25, total bilirubin 3.1, white count 4.6, hemoglobin 14.0, hematocrit 40.8, MCV 91.9, RDW 18.9, platelet count 19,000, TSH 0.578 CT scan of abdomen and pelvis shows mild wall thickening of the stomach and proximal ascending colon.  Multifocal hepatocellular carcinoma is identified within the background of liver cirrhosis.  Tumor burden within the liver appears similar to previous exam with a new focal area of low attenuation within the inferior right hepatic lobe.  Thrombus within the medial portal vein is identified. Stigmata of portal venous hypertension is identified including ascites, splenomegaly and varices. Chest x-ray reviewed by me shows no active disease Twelve-lead  EKG shows sinus arrhythmia with PVCs   ED Course: Patient is a 64 year old Caucasian male with a history of hepatocellular carcinoma and cholangiocarcinoma who presents to the ER for evaluation of worsening abdominal pain.  Ascitic fluid studies is consistent with SBP as he has > 250 PMN.  Patient also noted to have acute kidney injury 0.70 >> 1.90 as well as elevated total bilirubin level.  Patient also noted to have a portal vein thrombus but is not a candidate for anticoagulation due to severe thrombocytopenia.  Patient received a dose of Rocephin, he will be admitted to the hospital for further evaluation.   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 He has massive ascites and is status post multiple paracentesis and recently had a peritoneal catheter inserted on 05/07/20 for fluid drainage. Ascitic fluid yields greater than 250 PMN. Abdominal pain somewhat improved today. - continue ceftriaxone (9/19> - continue albumin - f/u cultures, nothing growing yet  # Acute kidney injury  Baseline normal kidney function, Cr to 1.8 [facility], today down to 1.2 with albumin. Nephrology following - continue albumin - home furosemide and spironolactone on hold currently - appreciate nephrology recs  # Hyponatremia - mild, 130, stable, 2/2 cirrhosis - monitor  # Cirrhosis # Hepatic encephalopathy - mild confusion today,  unclear what baseline is - re-start home lactulose  # Portal vein thrombus Found on CT scan of abdomen and pelvis done for evaluation of  abdominal pain. Patient is a noted candidate for anticoagulation due to severe thrombocytopenia  # Thrombocytopenia Platelet count is 19,000 with no evidence of bleeding. Baseline around 30K. 2/2 hcc/cholangiocarcinoma. Oncology following, advises monitoring for now.  - trend - appreciate oncology recs  # Mixed HCC/cholangiocarcinoma s/p Y90 radioembolization in 2019 followed by Gem/Cis and capecitabine monotherapy with disease progression currently on Bev/Atezo. Last dose on 03/26/2020. Next chemo planned for 9/22 @ [facility] - oncology following - is on wait-list for transfer to [facility]  # Right lower quadrant ulcer - per hpi was treated for abdominal cellulitis recently at [place], this appears to be chronic wound at site of peritoneal catheter - wound care consult recs appreciated  # History of seizure disorder Not on any antiepileptic medications at this time. We will place patient on seizure precaution - Administer Ativan as needed for seizures  # Anxiety disorder # Chronic pain - Continue as needed alprazolam, dilaudid   DVT prophylaxis: SCDs Code Status: Full Family Communication: attempted to update brother Ronalee Belts telephonically, no answer Disposition Plan: TBD, possible [facility] transfer   Consultants:   Oncology, Hematology, Gastroenterology  Procedures:  none  Antimicrobials:  Ceftriaxone 9/19>    Subjective: Ongoing abdominal pain slightly improved from yesterday. No n/v, has appetite. Hasn't stooled. No chest pain or dyspnea  Objective: Vitals:   05/29/2020 1813 05/22/2020 2021 05/26/20 0439 05/26/20 0746  BP:  (!) 115/57 130/60 (!) 124/57  Pulse:  96 87 90  Resp:  20 16 19   Temp:  98.2 F (36.8 C) 98.3 F (36.8 C) 99.6 F (37.6 C)  TempSrc:  Oral Oral Oral  SpO2:  97% 96% 99%  Weight: 87.9 kg  87.3 kg   Height: 6' (1.829 m)       Intake/Output Summary (Last 24 hours) at 05/26/2020 0803 Last data filed at 05/26/2020 0000 Gross per 24 hour  Intake 83.62 ml    Output 100 ml  Net -16.38 ml   Filed Weights   05/20/2020 0400 05/30/2020 1813 05/26/20 0439  Weight: 88 kg 87.9 kg 87.3 kg    Examination:  General exam: NAD, chronically ill appearing Respiratory system: faint rales at bases, o/w clear Cardiovascular system: rrr, soft systolic murmur Gastrointestinal system: severely distended, serious colored fluid somewhat saturating gauze around peritoneal catheter, no erythema Central nervous system: Alert and oriented. No focal neurological deficits. tremor Extremities: Symmetric 5 x 5 power. Skin: No rashes, lesions or ulcers Psychiatry: Judgement and insight appear normal. Mood & affect appropriate.     Data Reviewed: I have personally reviewed following labs and imaging studies  CBC: Recent Labs  Lab 05/12/2020 0402 05/26/20 0632  WBC 4.6 5.9  NEUTROABS 3.3  --   HGB 14.0 11.1*  HCT 40.8 33.5*  MCV 91.9 95.2  PLT 19* 19*   Basic Metabolic Panel: Recent Labs  Lab 05/21/2020 0402 05/26/20 0632  NA 130* 130*  K 4.4 4.5  CL 91* 93*  CO2 29 27  GLUCOSE 112* 115*  BUN 39* 39*  CREATININE 1.90* 1.20  CALCIUM 8.5* 8.5*   GFR: Estimated Creatinine Clearance: 69.2 mL/min (by C-G formula based on SCr of 1.2 mg/dL). Liver Function Tests: Recent Labs  Lab 05/08/2020 0402  AST 80*  ALT 20  ALKPHOS 119  BILITOT 3.1*  PROT 6.4*  ALBUMIN 2.7*   Recent Labs  Lab  05/21/2020 0402  LIPASE 44   Recent Labs  Lab 05/23/2020 0413  AMMONIA 25   Coagulation Profile: No results for input(s): INR, PROTIME in the last 168 hours. Cardiac Enzymes: No results for input(s): CKTOTAL, CKMB, CKMBINDEX, TROPONINI in the last 168 hours. BNP (last 3 results) No results for input(s): PROBNP in the last 8760 hours. HbA1C: No results for input(s): HGBA1C in the last 72 hours. CBG: No results for input(s): GLUCAP in the last 168 hours. Lipid Profile: No results for input(s): CHOL, HDL, LDLCALC, TRIG, CHOLHDL, LDLDIRECT in the last 72  hours. Thyroid Function Tests: No results for input(s): TSH, T4TOTAL, FREET4, T3FREE, THYROIDAB in the last 72 hours. Anemia Panel: No results for input(s): VITAMINB12, FOLATE, FERRITIN, TIBC, IRON, RETICCTPCT in the last 72 hours. Urine analysis:    Component Value Date/Time   COLORURINE YELLOW 11/02/2019 1016   APPEARANCEUR CLEAR 11/02/2019 1016   APPEARANCEUR Clear 04/17/2012 2104   LABSPEC 1.010 11/02/2019 1016   LABSPEC 1.008 04/17/2012 2104   PHURINE 6.0 11/02/2019 1016   GLUCOSEU NEGATIVE 11/02/2019 1016   GLUCOSEU Negative 04/17/2012 2104   HGBUR NEGATIVE 11/02/2019 1016   BILIRUBINUR NEGATIVE 11/02/2019 1016   BILIRUBINUR Negative 04/17/2012 2104   KETONESUR NEGATIVE 11/02/2019 1016   PROTEINUR NEGATIVE 11/02/2019 1020   NITRITE NEGATIVE 11/02/2019 1016   LEUKOCYTESUR NEGATIVE 11/02/2019 1016   LEUKOCYTESUR Negative 04/17/2012 2104   Sepsis Labs: @LABRCNTIP (procalcitonin:4,lacticidven:4)  ) Recent Results (from the past 240 hour(s))  Peritoneal Fluid culture ( includes gram stain)     Status: None (Preliminary result)   Collection Time: 05/14/2020  4:09 AM   Specimen: Peritoneal Washings; Peritoneal Fluid  Result Value Ref Range Status   Specimen Description   Final    PERITONEAL Performed at [facility], [address], [place], [place] [address]    Special Requests   Final    Immunocompromised Performed at [facility], [address] [place], [place] [address]    Gram Stain   Final    RARE WBC PRESENT, PREDOMINANTLY MONONUCLEAR NO ORGANISMS SEEN Performed at [facility], [place] [address] [place], [place] [address]    Culture PENDING  Incomplete   Report Status PENDING  Incomplete  Blood culture (routine x 2)     Status: None (Preliminary result)   Collection Time: 05/23/2020  4:14 AM   Specimen: BLOOD  Result Value Ref Range Status   Specimen Description BLOOD RIGHT ANTECUBITAL  Final   Special Requests   Final    BOTTLES  DRAWN AEROBIC AND ANAEROBIC Blood Culture adequate volume   Culture   Final    NO GROWTH 1 DAY Performed at [facility], [address] [place], [place] [address]    Report Status PENDING  Incomplete  Blood culture (routine x 2)     Status: None (Preliminary result)   Collection Time: 06/02/2020  4:14 AM   Specimen: BLOOD  Result Value Ref Range Status   Specimen Description BLOOD BLOOD RIGHT HAND  Final   Special Requests   Final    BOTTLES DRAWN AEROBIC AND ANAEROBIC Blood Culture results may not be optimal due to an excessive volume of blood received in culture bottles   Culture   Final    NO GROWTH 1 DAY Performed at [facility], [address] [place], [place] [address]    Report Status PENDING  Incomplete  SARS Coronavirus 2 by RT PCR (hospital order, performed in [facility] hospital lab) Nasopharyngeal  Nasopharyngeal Swab     Status: None   Collection Time: 05/15/2020  8:31 AM   Specimen: Nasopharyngeal Swab  Result Value Ref Range Status   SARS Coronavirus 2 NEGATIVE NEGATIVE Final    Comment: (NOTE) SARS-CoV-2 target nucleic acids are NOT DETECTED.  The SARS-CoV-2 RNA is generally detectable in upper and lower respiratory specimens during the acute phase of infection. The lowest concentration of SARS-CoV-2 viral copies this assay can detect is 250 copies / mL. A negative result does not preclude SARS-CoV-2 infection and should not be used as the sole basis for treatment or other patient management decisions.  A negative result may occur with improper specimen collection / handling, submission of specimen other than nasopharyngeal swab, presence of viral mutation(s) within the areas targeted by this assay, and inadequate number of viral copies (<250 copies / mL). A negative result must be combined with clinical observations, patient history, and epidemiological information.  Fact Sheet for Patients:    StrictlyIdeas.no  Fact Sheet for Healthcare Providers: BankingDealers.co.za  This test is not yet approved or  cleared by the [place] FDA and has been authorized for detection and/or diagnosis of SARS-CoV-2 by FDA under an Emergency Use Authorization (EUA).  This EUA will remain in effect (meaning this test can be used) for the duration of the COVID-19 declaration under Section 564(b)(1) of the Act, 21 U.S.C. section 360bbb-3(b)(1), unless the authorization is terminated or revoked sooner.  Performed at [facility], [address] [place], [place] [address]          Radiology Studies: DG Chest 1 View  Result Date: 05/12/2020 CLINICAL DATA:  Cough EXAM: CHEST  1 VIEW COMPARISON:  04/17/2012 FINDINGS: The heart size and mediastinal contours are within normal limits. Both lungs are clear. The visualized skeletal structures are unremarkable. IMPRESSION: No active disease. Electronically Signed   By: Ulyses Jarred M.D.   On: 05/30/2020 04:46   CT ABDOMEN PELVIS W CONTRAST  Result Date: 05/22/2020 CLINICAL DATA:  Abdominal pain. Acute, nonlocalized. Stage IV liver cancer. EXAM: CT ABDOMEN AND PELVIS WITH CONTRAST TECHNIQUE: Multidetector CT imaging of the abdomen and pelvis was performed using the standard protocol following bolus administration of intravenous contrast. CONTRAST:  35mL OMNIPAQUE IOHEXOL 300 MG/ML  SOLN COMPARISON:  09/10/2019 FINDINGS: Lower chest: Trace right pleural effusion. Hepatobiliary: The liver is cirrhotic. 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 Dominant lesion within the anterior right lobe of liver measures 9.5 by 6.2 by 8.6 cm (volume = 270 cm^3), image 18/2. Previously 9.7 x 5.9 by 8.5 cm (volume = 250 cm^3). 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 New foci of low attenuation within the inferior right hepatic lobe measures 0.8 cm, image 32/2. Pancreas:  Unremarkable. No pancreatic ductal dilatation or surrounding inflammatory changes. Spleen: Splenomegaly. Spleen has a cranial caudal dimension of 15.7 cm, image 63/5. Unchanged. Adrenals/Urinary Tract: Normal appearance of the adrenal glands. No kidney mass or hydronephrosis identified. Urinary bladder normal. Stomach/Bowel: Mild wall thickening of the stomach is identified. The appendix is visualized and appears normal. Mild wall thickening of the proximal ascending colon is identified. Vascular/Lymphatic: Aortic atherosclerosis. No aneurysm. Perisplenic varices. Recanalization of the umbilical vein. Upper abdominal adenopathy is again noted. Index portacaval node measures 3.8 x 3.4 cm, image 28/2. Previously 4.3 x 3.5 cm. Porta hepatic node measures 1.5 x 1.7 cm, image 21/2. Previously 2.7 by 2.5 cm. Soft tissue stranding is identified within the retroperitoneum along with multiple small lymph nodes. Similar to  previous exam. Index left retroperitoneal lymph node measures 1 cm, image 53/2. Previously 1.7 cm. Aortocaval node measures 1.1 cm, image 41/2. Previously 1.6 cm. No pelvic or inguinal adenopathy. Reproductive: Prostate is unremarkable. Other: Small volume of abdominopelvic ascites is similar to previous exam. Tunneled peritoneal catheter is identified which enters from a right lower quadrant approach with tip terminating in the left lower quadrant. Musculoskeletal: No acute or significant osseous findings. IMPRESSION: 1. Mild wall thickening of the stomach and proximal ascending colon. Nonspecific in the setting of ascites and portal venous hypertension. Correlate for any clinical signs or symptoms of gastritis and or colitis 2. Multifocal hepatocellular carcinoma is identified within a background of cirrhosis. Tumor burden within the liver appears similar to previous exam with a new focal area of low attenuation within the inferior right hepatic lobe. Thrombus within the middle portal vein is identified.  3. Mild decrease in size of upper abdominal adenopathy. 4. Stigmata of portal venous hypertension is identified including ascites, splenomegaly and varices. 5. Similar appearance of soft tissue stranding within the retroperitoneum with multiple small lymph nodes. 6. Aortic atherosclerosis. Aortic Atherosclerosis (ICD10-I70.0). Electronically Signed   By: Kerby Moors M.D.   On: 05/08/2020 07:07        Scheduled Meds: . baclofen  10 mg Oral TID  . folic acid  1 mg Oral Daily  . loratadine  10 mg Oral Daily  . magnesium oxide  400 mg Oral Daily  . pantoprazole  40 mg Oral Daily  . sodium chloride flush  3 mL Intravenous Q12H   Continuous Infusions: . sodium chloride    . sodium chloride Stopped (05/26/2020 2338)  . albumin human 25 g (05/26/20 0624)  . cefTRIAXone (ROCEPHIN)  IV Stopped (05/12/2020 2117)     LOS: 1 day    Time spent: 62 min    Desma Maxim, MD [facility]   If 7PM-7AM, please contact night-coverage [email or web address] Password [email or web address] 05/26/2020, 8:03 AM

## 2020-05-26 NOTE — Consult Note (Signed)
[name], MD [facility]  [address], [place], [place] [address] Phone: [phone] Fax : [phone]  Consultation  Referring Provider:     Dr. Si Raider Primary Care Physician:  Cletis Athens, MD Primary Gastroenterologist:  [facility]         Reason for Consultation:     Management of ascites and cirrhosis  Date of Admission:  05/15/2020 Date of Consultation:  05/26/2020         HPI:   John [name] is a 64 y.o. male who gets all of his care and due to [facility] and was diagnosed with mixed hepatocellular and bile duct carcinoma. The patient is not able to give much history and seems confused. The patient was admitted with abdominal pain and was found to have SBP.  The patient reports his pain to be very bad.  He rated as a 10 out of 10 when it is bothering him.  He also reports that he has had chronic ascites and is followed by oncology at [facility].  The patient did have a EGD and colonoscopy in the past by Dr. Vira Agar many years ago.  The patient is now being treated with bevacizumab/atezolizumab.  The patient also is reported to have a peritoneal catheter that was placed on September 1. The patient's peritoneal fluid grew out Streptococcus Mitas/oralis After his cell count showed 890 for PMNs with 87% of them being neutrophils.  Past Medical History:  Diagnosis Date  . Abnormal MRI, lumbar spine (2015) 01/21/2016   FINDINGS:  L3-4: Tiny right foraminal and extra foraminal disc bulge adjacent to but not compressing the L3 nerve lateral to the neural foramen. L4-5: There is a small broad-based disc bulge. There is also hypertrophy of the ligamentum flavum and facet joints, left more than right creating moderately severe spinal stenosis and bilateral lateral recess stenosis, left greater than right. This appe  . Anxiety   . Back ache 05/06/2014  . Cervical pain 05/06/2014  . 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[place]) 06/23/2015  . COPD (chronic obstructive  pulmonary disease) ([place])   . De Quervain's tenosynovitis, left 07/07/2017  . Depression   . Dystonia   . Epileptic disorder ([place]) 06/23/2015  . Extremity pain 05/06/2014  . Febrile seizures ([place])    child  . Foot pain 09/11/2014  . Gastric ulcer 06/23/2015  . Gout   . H/O neoplasm 06/23/2015  . Head injury 06/23/2015  . Hemorrhoids   . Hepatic cirrhosis ([place]) 06/23/2015  . Hepatitis B   . History of GI bleed   . Leukopenia   . Liver cancer ([place]) 08/2018  . Neurogenic pain 01/21/2016  . Nodule of upper lobe of left lung 12/02/2017  . Non-small cell cancer of left lung ([place]) 2019   Rad tx's.   . Obstructive sleep apnea 06/23/2015  . 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 Osteoarthritis of spine with radiculopathy, lumbosacral region 06/23/2015  . Peripheral neuropathy 06/23/2015  . Peripheral neuropathy ([place]) 22/29/7989  . Personal history of tobacco use, presenting hazards to health 01/09/2016  . Pulse irregularity   . Seizures ([place])   . Spinal stenosis   . Splenomegaly 05/30/2013  . Thrombocytopenia ([place]) 06/23/2015  . Uncomplicated opioid dependence ([place]) 06/23/2015    Past Surgical History:  Procedure Laterality Date  . ESOPHAGOGASTRODUODENOSCOPY  11/2013  . ESOPHAGOGASTRODUODENOSCOPY (EGD) WITH PROPOFOL N/A 06/23/2018   Procedure: ESOPHAGOGASTRODUODENOSCOPY (EGD) WITH PROPOFOL;  Surgeon: Manya Silvas, MD;  Location: [facility]COPY;  Service: Endoscopy;  Laterality: N/A;  . surgery for cervical neck fracture    . TOOTH EXTRACTION Right 09/28/2016    Prior to Admission medications   Medication Sig Start Date End Date Taking? Authorizing Provider  baclofen (LIORESAL) 10 MG tablet Take 1 tablet (10 mg total) by mouth 3 (three) times daily. 02/27/20 08/25/20 Yes Milinda Pointer, MD  ciprofloxacin (CIPRO) 500 MG tablet Take 500 mg by mouth daily. 05/07/20  Yes [provider]  DOPTELET 20 MG TABS Take 40 mg by mouth daily.  05/01/20  Yes [provider]  folic acid (FOLVITE) 1 MG tablet Take 1 mg by mouth daily.    Yes [provider]  furosemide (LASIX) 40 MG tablet Take 40 mg by mouth daily. 05/14/20  Yes [provider]  gabapentin (NEURONTIN) 600 MG tablet Take 1 tablet (600 mg total) by mouth 3 (three) times daily. 02/27/20 08/25/20 Yes Milinda Pointer, MD  lactulose (CHRONULAC) 10 GM/15ML solution Take 15 mLs by mouth 3 (three) times daily. Take 15 mls two to three times daily so that you have 3-4 bowel movements per day 05/24/20  Yes [provider]  Magnesium 500 MG CAPS Take 500 mg by mouth 2 (two) times a day.   Yes [provider]  omeprazole (PRILOSEC) 20 MG capsule Take 1 capsule (20 mg total) by mouth daily. Patient taking differently: Take 20 mg by mouth 2 (two) times daily before a meal.  11/28/19 11/27/20 Yes Milinda Pointer, MD  oxyCODONE (OXY IR/ROXICODONE) 5 MG immediate release tablet Take 1-2 tablets by mouth every 4 (four) hours as needed. 4-6 hrs prn 12/14/19  Yes [provider]  ondansetron (ZOFRAN) 8 MG tablet Take 1 tablet (8 mg total) by mouth every 8 (eight) hours as needed for nausea or vomiting. 01/25/19   [name], MD  spironolactone (ALDACTONE) 25 MG tablet Take 25 mg by mouth daily. Patient not taking: Reported on 05/21/2020 03/05/20   [provider]  bisacodyl (DULCOLAX) 5 MG EC tablet Take 1 tablet (5 mg total) by mouth daily as needed for moderate constipation. 06/07/19 08/22/19  Gillis [place], MD    Family History  Problem Relation Age of Onset  . Stroke Mother   . Cancer Father 70       prostate cancer  . Heart disease Father   . Stroke Father   . Cancer Brother 60       Lung Cancer  . Cancer Maternal Aunt        breast cancer   . Cancer Cousin        breast cancer     Social History   Tobacco Use  . Smoking status: Former Smoker    Packs/day: 0.50    Years: 35.00    Pack years: 17.50    Types:  Cigarettes    Start date: 10/07/2017    Quit date: 10/07/2017    Years since quitting: 2.6  . Smokeless tobacco: Current User    Types: Snuff  Vaping Use  . Vaping Use: Every day  Substance Use Topics  . Alcohol use: No    Alcohol/week: 0.0 standard drinks    Comment: Pt used to drink alcohol for 30 years, states that he has been alcohol free since July 2013...  . Drug use: No    Allergies as of 05/10/2020 - Review Complete 06/04/2020  Allergen Reaction Noted  . Codeine Nausea Only 05/06/2014  . Morphine Nausea Only 03/20/2015  . Duloxetine Rash 03/20/2015  .  Oxycodone Hives 09/21/2019    Review of Systems:    All systems reviewed and negative except where noted in HPI.   Physical Exam:  Vital signs in last 24 hours: Temp:  [97.9 F (36.6 C)-99.6 F (37.6 C)] 97.9 F (36.6 C) (09/20 1116) Pulse Rate:  [83-96] 92 (09/20 1116) Resp:  [16-22] 19 (09/20 1116) BP: (115-130)/(57-92) 118/60 (09/20 1116) SpO2:  [96 %-100 %] 98 % (09/20 1116) Weight:  [87.3 kg-87.9 kg] 87.3 kg (09/20 0439) Last BM Date: 05/23/20 General:   Pleasant, cooperative in NAD Head:  Normocephalic and atraumatic. Eyes:   Positiveicterus.   Conjunctiva Slightly yellow. PERRLA. Ears:  Normal auditory acuity. Neck:  Supple; no masses or thyroidomegaly Lungs: Respirations even and unlabored. Lungs clear to auscultation bilaterally.   No wheezes, crackles, or rhonchi.  Heart:  Regular rate and rhythm;  Without murmur, clicks, rubs or gallops Abdomen:  Tense, Positive distention, Diffusely tender. Normal bowel sounds. No appreciable masses or hepatomegaly.  No rebound or guarding.  Rectal:  Not performed. Msk:  Symmetrical without gross deformities.   Extremities:  Without edema, cyanosis or clubbing. Neurologic:  Unable to test due to patient cooperation Skin:  Patient has an open wound on his right lower abdomen Cervical Nodes:  No significant cervical adenopathy. Psych:  Alert and cooperative. Normal  affect.  LAB RESULTS: Recent Labs    05/09/2020 0402 05/26/20 0632  WBC 4.6 5.9  HGB 14.0 11.1*  HCT 40.8 33.5*  PLT 19* 19*   BMET Recent Labs    05/20/2020 0402 05/26/20 0632  NA 130* 130*  K 4.4 4.5  CL 91* 93*  CO2 29 27  GLUCOSE 112* 115*  BUN 39* 39*  CREATININE 1.90* 1.20  CALCIUM 8.5* 8.5*   LFT Recent Labs    05/15/2020 0402  PROT 6.4*  ALBUMIN 2.7*  AST 80*  ALT 20  ALKPHOS 119  BILITOT 3.1*   PT/INR No results for input(s): LABPROT, INR in the last 72 hours.  STUDIES: DG Chest 1 View  Result Date: 05/24/2020 CLINICAL DATA:  Cough EXAM: CHEST  1 VIEW COMPARISON:  04/17/2012 FINDINGS: The heart size and mediastinal contours are within normal limits. Both lungs are clear. The visualized skeletal structures are unremarkable. IMPRESSION: No active disease. Electronically Signed   By: Ulyses Jarred M.D.   On: 05/28/2020 04:46   CT ABDOMEN PELVIS W CONTRAST  Result Date: 05/11/2020 CLINICAL DATA:  Abdominal pain. Acute, nonlocalized. Stage IV liver cancer. EXAM: CT ABDOMEN AND PELVIS WITH CONTRAST TECHNIQUE: Multidetector CT imaging of the abdomen and pelvis was performed using the standard protocol following bolus administration of intravenous contrast. CONTRAST:  47mL OMNIPAQUE IOHEXOL 300 MG/ML  SOLN COMPARISON:  09/10/2019 FINDINGS: Lower chest: Trace right pleural effusion. Hepatobiliary: The liver is cirrhotic. 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 Dominant lesion within the anterior right lobe of liver measures 9.5 by 6.2 by 8.6 cm (volume = 270 cm^3), image 18/2. Previously 9.7 x 5.9 by 8.5 cm (volume = 250 cm^3). 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 New foci of low attenuation within the inferior right hepatic lobe measures 0.8 cm, image 32/2. Pancreas: Unremarkable. No pancreatic ductal dilatation or surrounding inflammatory changes. Spleen: Splenomegaly. Spleen has a cranial caudal dimension of 15.7 cm, image 63/5.  Unchanged. Adrenals/Urinary Tract: Normal appearance of the adrenal glands. No kidney mass or hydronephrosis identified. Urinary bladder normal. Stomach/Bowel: Mild wall thickening of the stomach is identified. The appendix is visualized and appears normal.  Mild wall thickening of the proximal ascending colon is identified. Vascular/Lymphatic: Aortic atherosclerosis. No aneurysm. Perisplenic varices. Recanalization of the umbilical vein. Upper abdominal adenopathy is again noted. Index portacaval node measures 3.8 x 3.4 cm, image 28/2. Previously 4.3 x 3.5 cm. Porta hepatic node measures 1.5 x 1.7 cm, image 21/2. Previously 2.7 by 2.5 cm. Soft tissue stranding is identified within the retroperitoneum along with multiple small lymph nodes. Similar to previous exam. Index left retroperitoneal lymph node measures 1 cm, image 53/2. Previously 1.7 cm. Aortocaval node measures 1.1 cm, image 41/2. Previously 1.6 cm. No pelvic or inguinal adenopathy. Reproductive: Prostate is unremarkable. Other: Small volume of abdominopelvic ascites is similar to previous exam. Tunneled peritoneal catheter is identified which enters from a right lower quadrant approach with tip terminating in the left lower quadrant. Musculoskeletal: No acute or significant osseous findings. IMPRESSION: 1. Mild wall thickening of the stomach and proximal ascending colon. Nonspecific in the setting of ascites and portal venous hypertension. Correlate for any clinical signs or symptoms of gastritis and or colitis 2. Multifocal hepatocellular carcinoma is identified within a background of cirrhosis. Tumor burden within the liver appears similar to previous exam with a new focal area of low attenuation within the inferior right hepatic lobe. Thrombus within the middle portal vein is identified. 3. Mild decrease in size of upper abdominal adenopathy. 4. Stigmata of portal venous hypertension is identified including ascites, splenomegaly and varices. 5. Similar  appearance of soft tissue stranding within the retroperitoneum with multiple small lymph nodes. 6. Aortic atherosclerosis. Aortic Atherosclerosis (ICD10-I70.0). Electronically Signed   By: Kerby Moors M.D.   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  The patient was also noted to have portal vein thrombosis.  The patient has a history of hepatitis B but the patient's surface antigen was negative in October 2019 indicating that this is not a chronic B infection.  Plan:  This patient's abdominal pain and SBP is already being treated with antibiotics and his abdominal pain should improve with his antibiotics.  The patient has been consults and for management of his cirrhosis with therapy of this being treating his hepatocellular carcinoma and cholangiocarcinoma in addition to mitigating the side effects of his cirrhosis like his ascites. I would be reluctant to start any Diuretics with the patient's baseline creatinine 6 months ago of 0.7 with his creatinine yesterday of 1.9 and today of 1.2.  His ascites may improve as his SBP is being treated.  Thank you for involving me in the care of this patient.      LOS: 1 day   [name], MD, [facility] 05/26/2020, 3:51 PM,  Pager [phone] 7am-5pm  Check AMION for 5pm -7am coverage and on weekends   Note: This dictation was prepared with Dragon dictation along with smaller phrase technology. Any transcriptional errors that result from this process are unintentional.

## 2020-05-27 LAB — COMPREHENSIVE METABOLIC PANEL
ALT: 14 U/L (ref 0–44)
AST: 61 U/L — ABNORMAL HIGH (ref 15–41)
Albumin: 4.5 g/dL (ref 3.5–5.0)
Alkaline Phosphatase: 74 U/L (ref 38–126)
Anion gap: 11 (ref 5–15)
BUN: 39 mg/dL — ABNORMAL HIGH (ref 8–23)
CO2: 29 mmol/L (ref 22–32)
Calcium: 9.2 mg/dL (ref 8.9–10.3)
Chloride: 94 mmol/L — ABNORMAL LOW (ref 98–111)
Creatinine, Ser: 0.91 mg/dL (ref 0.61–1.24)
GFR calc Af Amer: >60 mL/min (ref >60)
GFR calc non Af Amer: >60 mL/min (ref >60)
Glucose, Bld: 107 mg/dL — ABNORMAL HIGH (ref 70–99)
Potassium: 4.9 mmol/L (ref 3.5–5.1)
Sodium: 134 mmol/L — ABNORMAL LOW (ref 135–145)
Total Bilirubin: 4.8 mg/dL — ABNORMAL HIGH (ref 0.3–1.2)
Total Protein: 6.5 g/dL (ref 6.5–8.1)

## 2020-05-27 LAB — PROTEIN, BODY FLUID (OTHER): Total Protein, Body Fluid Other: 0.6 g/dL

## 2020-05-27 LAB — BODY FLUID CULTURE

## 2020-05-27 LAB — CBC
HCT: 34.4 % — ABNORMAL LOW (ref 39.0–52.0)
Hemoglobin: 11.3 g/dL — ABNORMAL LOW (ref 13.0–17.0)
MCH: 31.9 pg (ref 26.0–34.0)
MCHC: 32.8 g/dL (ref 30.0–36.0)
MCV: 97.2 fL (ref 80.0–100.0)
Platelets: 20 10*3/uL — CL (ref 150–400)
RBC: 3.54 MIL/uL — ABNORMAL LOW (ref 4.22–5.81)
RDW: 18.8 % — ABNORMAL HIGH (ref 11.5–15.5)
WBC: 5.2 10*3/uL (ref 4.0–10.5)
nRBC: 0 % (ref 0.0–0.2)

## 2020-05-27 MED ORDER — FUROSEMIDE 40 MG PO TABS
40.0000 mg | ORAL_TABLET | Freq: Every day | ORAL | Status: DC
  Administered 2020-05-27 – 2020-06-03 (×7): 40 mg via ORAL
  Filled 2020-05-27 (×8): qty 1

## 2020-05-27 MED ORDER — RIFAXIMIN 550 MG PO TABS
550.0000 mg | ORAL_TABLET | Freq: Two times a day (BID) | ORAL | Status: DC
  Administered 2020-05-27 – 2020-06-21 (×39): 550 mg via ORAL
  Filled 2020-05-27 (×51): qty 1

## 2020-05-27 MED ORDER — SPIRONOLACTONE 25 MG PO TABS
25.0000 mg | ORAL_TABLET | Freq: Every day | ORAL | Status: DC
  Administered 2020-05-27 – 2020-06-21 (×24): 25 mg via ORAL
  Filled 2020-05-27 (×25): qty 1

## 2020-05-27 NOTE — Progress Notes (Signed)
[name], MD [facility]   [address] [place], [place] [address] Phone: [phone] Fax : [phone]   Subjective: This patient has a history of cirrhosis with hepatic encephalopathy with confusion and today he seems more somnolent.  The patient has refused his lactulose.  He is awaiting transfer to [place] for his history of cholangiocarcinoma with hepatocellular carcinoma and cirrhosis.  The patient has tense ascites and spontaneous bacterial peritonitis.  His creatinine has come down but the nephrology note states that he is not a candidate for diuretics.   Objective: Vital signs in last 24 hours: Vitals:   05/27/20 0000 05/27/20 0417 05/27/20 0727 05/27/20 1148  BP:  (!) 108/95 128/65 109/81  Pulse:  89 89 88  Resp: 11 20 18 20   Temp:  97.9 F (36.6 C)  98.4 F (36.9 C)  TempSrc:  Oral  Oral  SpO2:  97% 96% 97%  Weight:  79.2 kg    Height:       Weight change: -8.657 kg  Intake/Output Summary (Last 24 hours) at 05/27/2020 1457 Last data filed at 05/27/2020 1456 Gross per 24 hour  Intake 593.35 ml  Output 200 ml  Net 393.35 ml     Exam: Heart:: Regular rate and rhythm, S1S2 present or without murmur or extra heart sounds Lungs: normal and clear to auscultation and percussion Abdomen: Tense ascites with shifting dullness and fluid wave.   Lab Results: @LABTEST2 @ Micro Results: Recent Results (from the past 240 hour(s))  Peritoneal Fluid culture ( includes gram stain)     Status: Abnormal   Collection Time: 05/21/2020  4:09 AM   Specimen: Peritoneal Washings; Peritoneal Fluid  Result Value Ref Range Status   Specimen Description   Final    PERITONEAL Performed at [facility], [address] [place], [place] [address]    Special Requests   Final    Immunocompromised Performed at [facility], [address] [place], [place] [address]    Gram Stain   Final    RARE WBC PRESENT, PREDOMINANTLY MONONUCLEAR NO ORGANISMS  SEEN Performed at [facility], [place] [address] [place], [place] [address]    Culture STREPTOCOCCUS MITIS/ORALIS (A)  Final   Report Status 05/27/2020 FINAL  Final   Organism ID, Bacteria STREPTOCOCCUS MITIS/ORALIS  Final      Susceptibility   Streptococcus mitis/oralis - MIC*    PENICILLIN <=0.06 SENSITIVE Sensitive     CEFTRIAXONE <=0.12 SENSITIVE Sensitive     ERYTHROMYCIN 2 RESISTANT Resistant     LEVOFLOXACIN >=16 RESISTANT Resistant     VANCOMYCIN 0.25 SENSITIVE Sensitive     * STREPTOCOCCUS MITIS/ORALIS  Blood culture (routine x 2)     Status: None (Preliminary result)   Collection Time: 05/19/2020  4:14 AM   Specimen: BLOOD  Result Value Ref Range Status   Specimen Description BLOOD RIGHT ANTECUBITAL  Final   Special Requests   Final    BOTTLES DRAWN AEROBIC AND ANAEROBIC Blood Culture adequate volume   Culture   Final    NO GROWTH 2 DAYS Performed at [facility], [address], [place], [place] [address]    Report Status PENDING  Incomplete  Blood culture (routine x 2)     Status: None (Preliminary result)   Collection Time: 05/13/2020  4:14 AM   Specimen: BLOOD  Result Value Ref Range Status   Specimen Description BLOOD BLOOD RIGHT HAND  Final   Special Requests   Final  BOTTLES DRAWN AEROBIC AND ANAEROBIC Blood Culture results may not be optimal due to an excessive volume of blood received in culture bottles   Culture   Final    NO GROWTH 2 DAYS Performed at [facility], [address] [place], [place] [address]    Report Status PENDING  Incomplete  SARS Coronavirus 2 by RT PCR (hospital order, performed in [facility] hospital lab) Nasopharyngeal Nasopharyngeal Swab     Status: None   Collection Time: 05/08/2020  8:31 AM   Specimen: Nasopharyngeal Swab  Result Value Ref Range Status   SARS Coronavirus 2 NEGATIVE NEGATIVE Final    Comment: (NOTE) SARS-CoV-2 target nucleic acids are NOT DETECTED.  The SARS-CoV-2 RNA is generally  detectable in upper and lower respiratory specimens during the acute phase of infection. The lowest concentration of SARS-CoV-2 viral copies this assay can detect is 250 copies / mL. A negative result does not preclude SARS-CoV-2 infection and should not be used as the sole basis for treatment or other patient management decisions.  A negative result may occur with improper specimen collection / handling, submission of specimen other than nasopharyngeal swab, presence of viral mutation(s) within the areas targeted by this assay, and inadequate number of viral copies (<250 copies / mL). A negative result must be combined with clinical observations, patient history, and epidemiological information.  Fact Sheet for Patients:   StrictlyIdeas.no  Fact Sheet for Healthcare Providers: BankingDealers.co.za  This test is not yet approved or  cleared by the [place] FDA and has been authorized for detection and/or diagnosis of SARS-CoV-2 by FDA under an Emergency Use Authorization (EUA).  This EUA will remain in effect (meaning this test can be used) for the duration of the COVID-19 declaration under Section 564(b)(1) of the Act, 21 U.S.C. section 360bbb-3(b)(1), unless the authorization is terminated or revoked sooner.  Performed at [facility], [address], [place], [place] [address]    Studies/Results: No results found. Medications: I have reviewed the patient's current medications. Scheduled Meds: . baclofen  10 mg Oral TID  . collagenase   Topical Daily  . folic acid  1 mg Oral Daily  . furosemide  40 mg Oral Daily  . lactulose  20 g Oral BID  . loratadine  10 mg Oral Daily  . magnesium oxide  400 mg Oral Daily  . pantoprazole  40 mg Oral Daily  . sodium chloride flush  3 mL Intravenous Q12H  . spironolactone  25 mg Oral Daily   Continuous Infusions: . sodium chloride    . sodium chloride Stopped (05/20/2020  2338)  . cefTRIAXone (ROCEPHIN)  IV 2 g (05/27/20 1008)   PRN Meds:.sodium chloride, sodium chloride, ALPRAZolam, HYDROmorphone (DILAUDID) injection, LORazepam, ondansetron **OR** ondansetron (ZOFRAN) IV, sodium chloride flush   Assessment: Principal Problem:   SBP (spontaneous bacterial peritonitis) (HCC) Active Problems:   Thrombocytopenia ([place])   Depression   Epileptic disorder ([place])   Cholangiocarcinoma ([place])   Cancer associated pain   Hepatocellular, cholangiocarcinoma combined ([employer])   Portal vein thrombosis   AKI (acute kidney injury) ([place])    Plan: This patient has SBP with ascites from cirrhosis and is not a candidate per nephrology for any diuretics due to his suspected prerenal azotemia on admission.  His renal function has improved which rules out this being hepatorenal syndrome.  The patient is awaiting transfer to [facility].  He has refused his lactulose.  I will start him on Xifaxan 550 mg twice  a day for his hepatic cephalopathy.   LOS: 2 days   Lewayne Bunting 05/27/2020, 2:57 PM Pager [phone] 7am-5pm  Check AMION for 5pm -7am coverage and on weekends

## 2020-05-27 NOTE — Progress Notes (Addendum)
PROGRESS NOTE    John [name]  RSW:[id number] DOB: [birth year] DOA: 05/18/2020 PCP: Cletis Athens, MD  Outpatient Specialists: [facility]    Brief Narrative:  John [name] is a 64 y.o. 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  Patient states that he usually has pain in his abdomen but over the last 2 days it become so severe and he rates it a 10 x 10 in intensity at its worst.  Pain is diffuse and intermittent, occurring in spasms.  He denies having any fever, no vomiting, no diarrhea, no dysuria, no melena stools, no hematemesis or hematochezia. Patient receives all his oncological care at [facility] and had requested transfer to [facility].  They have no beds available at this time and patient will be admitted to [facility] pending bed availability at [facility] show sodium 130, potassium 4.4, chloride 91, bicarb 29, BUN 39, creatinine 1.9, calcium 8.5, alkaline phosphatase 119, AST 80, ALT 20, total protein 6.4, ammonia 25, total bilirubin 3.1, white count 4.6, hemoglobin 14.0, hematocrit 40.8, MCV 91.9, RDW 18.9, platelet count 19,000, TSH 0.578 CT scan of abdomen and pelvis shows mild wall thickening of the stomach and proximal ascending colon.  Multifocal hepatocellular carcinoma is identified within the background of liver cirrhosis.  Tumor burden within the liver appears similar to previous exam with a new focal area of low attenuation within the inferior right hepatic lobe.  Thrombus within the medial portal vein is identified. Stigmata of portal venous hypertension is identified including ascites, splenomegaly and varices. Chest x-ray reviewed by me shows no active disease Twelve-lead  EKG shows sinus arrhythmia with PVCs   ED Course: Patient is a 64 year old Caucasian male with a history of hepatocellular carcinoma and cholangiocarcinoma who presents to the ER for evaluation of worsening abdominal pain.  Ascitic fluid studies is consistent with SBP as he has > 250 PMN.  Patient also noted to have acute kidney injury 0.70 >> 1.90 as well as elevated total bilirubin level.  Patient also noted to have a portal vein thrombus but is not a candidate for anticoagulation due to severe thrombocytopenia.  Patient received a dose of Rocephin, he will be admitted to the hospital for further evaluation.   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 He has massive ascites and is status post multiple paracentesis and recently had a peritoneal catheter inserted on 05/07/20 for fluid drainage. Ascitic fluid yields greater than 250 PMN. Abdominal pain somewhat improved today. Culture growing s. mitis - continue ceftriaxone (9/19> ), plan for 5 days of tx - d/c albumin today - therapeutic 3 L off today if patient allows (declined yesterday)  # Acute kidney injury  Resolved with albumin and holding home diuretics and treatment of sbp - off albumin today, consider re-dose on 9/23 @ 1 g/kg body weight - discussed w/ nephrology, will re-start home lasix/spiro today - monitor  kidney function closely  # Hyponatremia - mild, stable, 2/2 cirrhosis - monitor  # Cirrhosis # Hepatic encephalopathy - mild confusion - have re-started home lactulose, pt refused this morning,  working w/ pt and nursing to get that re-started  # Portal vein thrombus Found on CT scan of abdomen and pelvis done for evaluation of abdominal pain. Patient is a noted candidate for anticoagulation due to severe thrombocytopenia  # Thrombocytopenia Platelet count is 19,000 with no evidence of bleeding. Baseline around 30K. 2/2 hcc/cholangiocarcinoma. Oncology following, advises monitoring for now. Stable - trend - appreciate oncology recs  # Nasal cannula O2 - on 2 L breathing comfortably, no report of home O2, CXR clear on presentation, faint rales at bases but otherwise lungs clear - nursing to see if can wean  # Mixed HCC/cholangiocarcinoma s/p Y90 radioembolization in 2019 followed by Gem/Cis and capecitabine monotherapy with disease progression currently on Bev/Atezo. Last dose on 03/26/2020. Next chemo planned for 9/22 @ [facility], will likely need to hold until infection resolved - oncology following - is on wait-list for transfer to [facility]  # Right lower quadrant ulcer - per hpi was treated for abdominal cellulitis recently at [place], this appears to be chronic wound at site of peritoneal catheter - wound care consult recs appreciated  # History of seizure disorder Not on any antiepileptic medications at this time. We will place patient on seizure precaution - Administer Ativan as needed for seizures  # Anxiety disorder # Chronic pain - Continue as needed alprazolam, dilaudid   DVT prophylaxis: SCDs Code Status: Full Family Communication: attempted to update brother Ronalee Belts telephonically, no answer again today Disposition Plan: TBD, possible [facility] transfer   Consultants:   Oncology, Hematology, Gastroenterology  Procedures:  none  Antimicrobials:  Ceftriaxone 9/19>    Subjective: Ongoing abdominal pain unchanged. No n/v, has appetite. Hasn't stooled. No chest pain or dyspnea.  Objective: Vitals:   05/27/20 0000 05/27/20 0417 05/27/20 0727 05/27/20 1148  BP:  (!)  108/95 128/65 109/81  Pulse:  89 89 88  Resp: 11 20 18 20   Temp:  97.9 F (36.6 C)  98.4 F (36.9 C)  TempSrc:  Oral  Oral  SpO2:  97% 96% 97%  Weight:  79.2 kg    Height:        Intake/Output Summary (Last 24 hours) at 05/27/2020 1152 Last data filed at 05/27/2020 0950 Gross per 24 hour  Intake 593.35 ml  Output 375 ml  Net 218.35 ml   Filed Weights   05/24/2020 1813 05/26/20 0439 05/27/20 0417  Weight: 87.9 kg 87.3 kg 79.2 kg    Examination:  General exam: NAD, chronically ill appearing Respiratory system: faint rales at bases, o/w clear Cardiovascular system: rrr, soft systolic murmur Gastrointestinal system: severely distended, serious colored fluid somewhat saturating gauze around peritoneal catheter, no erythema Central nervous system: Alert and oriented. No focal neurological deficits. tremor Extremities: Symmetric 5 x 5 power. Skin: No rashes, lesions or ulcers Psychiatry: Judgement and insight appear normal. Mood & affect appropriate.     Data Reviewed: I have personally reviewed following labs and imaging studies  CBC: Recent Labs  Lab 05/21/2020 0402 05/26/20 0632 05/27/20 0544  WBC 4.6 5.9 5.2  NEUTROABS 3.3  --   --   HGB 14.0 11.1* 11.3*  HCT 40.8 33.5* 34.4*  MCV 91.9 95.2 97.2  PLT 19* 19* 20*   Basic Metabolic Panel: Recent Labs  Lab 05/19/2020 0402 05/26/20 0632 05/27/20 0544  NA 130* 130*  134*  K 4.4 4.5 4.9  CL 91* 93* 94*  CO2 29 27 29   GLUCOSE 112* 115* 107*  BUN 39* 39* 39*  CREATININE 1.90* 1.20 0.91  CALCIUM 8.5* 8.5* 9.2   GFR: Estimated Creatinine Clearance: 91.2 mL/min (by C-G formula based on SCr of 0.91 mg/dL). Liver Function Tests: Recent Labs  Lab 05/09/2020 0402 05/27/20 0544  AST 80* 61*  ALT 20 14  ALKPHOS 119 74  BILITOT 3.1* 4.8*  PROT 6.4* 6.5  ALBUMIN 2.7* 4.5   Recent Labs  Lab 05/15/2020 0402  LIPASE 44   Recent Labs  Lab 05/19/2020 0413  AMMONIA 25   Coagulation Profile: No results for input(s):  INR, PROTIME in the last 168 hours. Cardiac Enzymes: No results for input(s): CKTOTAL, CKMB, CKMBINDEX, TROPONINI in the last 168 hours. BNP (last 3 results) No results for input(s): PROBNP in the last 8760 hours. HbA1C: No results for input(s): HGBA1C in the last 72 hours. CBG: No results for input(s): GLUCAP in the last 168 hours. Lipid Profile: No results for input(s): CHOL, HDL, LDLCALC, TRIG, CHOLHDL, LDLDIRECT in the last 72 hours. Thyroid Function Tests: No results for input(s): TSH, T4TOTAL, FREET4, T3FREE, THYROIDAB in the last 72 hours. Anemia Panel: No results for input(s): VITAMINB12, FOLATE, FERRITIN, TIBC, IRON, RETICCTPCT in the last 72 hours. Urine analysis:    Component Value Date/Time   COLORURINE YELLOW 11/02/2019 1016   APPEARANCEUR CLEAR 11/02/2019 1016   APPEARANCEUR Clear 04/17/2012 2104   LABSPEC 1.010 11/02/2019 1016   LABSPEC 1.008 04/17/2012 2104   PHURINE 6.0 11/02/2019 1016   GLUCOSEU NEGATIVE 11/02/2019 1016   GLUCOSEU Negative 04/17/2012 2104   HGBUR NEGATIVE 11/02/2019 1016   BILIRUBINUR NEGATIVE 11/02/2019 1016   BILIRUBINUR Negative 04/17/2012 2104   KETONESUR NEGATIVE 11/02/2019 1016   PROTEINUR NEGATIVE 11/02/2019 1020   NITRITE NEGATIVE 11/02/2019 1016   LEUKOCYTESUR NEGATIVE 11/02/2019 1016   LEUKOCYTESUR Negative 04/17/2012 2104   Sepsis Labs: @LABRCNTIP (procalcitonin:4,lacticidven:4)  ) Recent Results (from the past 240 hour(s))  Peritoneal Fluid culture ( includes gram stain)     Status: Abnormal   Collection Time: 06/04/2020  4:09 AM   Specimen: Peritoneal Washings; Peritoneal Fluid  Result Value Ref Range Status   Specimen Description   Final    PERITONEAL Performed at [facility], [address] [place], [place] [address]    Special Requests   Final    Immunocompromised Performed at [facility], [address] [place], [place] [address]    Gram Stain   Final    RARE WBC PRESENT, PREDOMINANTLY  MONONUCLEAR NO ORGANISMS SEEN Performed at [facility], [place] [address], [place], [place] [address]    Culture STREPTOCOCCUS MITIS/ORALIS (A)  Final   Report Status 05/27/2020 FINAL  Final   Organism ID, Bacteria STREPTOCOCCUS MITIS/ORALIS  Final      Susceptibility   Streptococcus mitis/oralis - MIC*    PENICILLIN <=0.06 SENSITIVE Sensitive     CEFTRIAXONE <=0.12 SENSITIVE Sensitive     ERYTHROMYCIN 2 RESISTANT Resistant     LEVOFLOXACIN >=16 RESISTANT Resistant     VANCOMYCIN 0.25 SENSITIVE Sensitive     * STREPTOCOCCUS MITIS/ORALIS  Blood culture (routine x 2)     Status: None (Preliminary result)   Collection Time: 05/14/2020  4:14 AM   Specimen: BLOOD  Result Value Ref Range Status   Specimen Description BLOOD RIGHT ANTECUBITAL  Final   Special Requests   Final    BOTTLES DRAWN AEROBIC AND  ANAEROBIC Blood Culture adequate volume   Culture   Final    NO GROWTH 2 DAYS Performed at [facility], [address] [place], [place] [address]    Report Status PENDING  Incomplete  Blood culture (routine x 2)     Status: None (Preliminary result)   Collection Time: 06/04/2020  4:14 AM   Specimen: BLOOD  Result Value Ref Range Status   Specimen Description BLOOD BLOOD RIGHT HAND  Final   Special Requests   Final    BOTTLES DRAWN AEROBIC AND ANAEROBIC Blood Culture results may not be optimal due to an excessive volume of blood received in culture bottles   Culture   Final    NO GROWTH 2 DAYS Performed at [facility], [address] [place], [place] [address]    Report Status PENDING  Incomplete  SARS Coronavirus 2 by RT PCR (hospital order, performed in [place] hospital lab) Nasopharyngeal Nasopharyngeal Swab     Status: None   Collection Time: 05/27/2020  8:31 AM   Specimen: Nasopharyngeal Swab  Result Value Ref Range Status   SARS Coronavirus 2 NEGATIVE NEGATIVE Final    Comment: (NOTE) SARS-CoV-2 target nucleic acids are NOT DETECTED.  The  SARS-CoV-2 RNA is generally detectable in upper and lower respiratory specimens during the acute phase of infection. The lowest concentration of SARS-CoV-2 viral copies this assay can detect is 250 copies / mL. A negative result does not preclude SARS-CoV-2 infection and should not be used as the sole basis for treatment or other patient management decisions.  A negative result may occur with improper specimen collection / handling, submission of specimen other than nasopharyngeal swab, presence of viral mutation(s) within the areas targeted by this assay, and inadequate number of viral copies (<250 copies / mL). A negative result must be combined with clinical observations, patient history, and epidemiological information.  Fact Sheet for Patients:   StrictlyIdeas.no  Fact Sheet for Healthcare Providers: BankingDealers.co.za  This test is not yet approved or  cleared by the [place] FDA and has been authorized for detection and/or diagnosis of SARS-CoV-2 by FDA under an Emergency Use Authorization (EUA).  This EUA will remain in effect (meaning this test can be used) for the duration of the COVID-19 declaration under Section 564(b)(1) of the Act, 21 U.S.C. section 360bbb-3(b)(1), unless the authorization is terminated or revoked sooner.  Performed at [facility], [address], [place], [place] [address]          Radiology Studies: No results found.      Scheduled Meds:  baclofen  10 mg Oral TID   collagenase   Topical Daily   folic acid  1 mg Oral Daily   furosemide  40 mg Oral Daily   lactulose  20 g Oral BID   loratadine  10 mg Oral Daily   magnesium oxide  400 mg Oral Daily   pantoprazole  40 mg Oral Daily   sodium chloride flush  3 mL Intravenous Q12H   spironolactone  25 mg Oral Daily   Continuous Infusions:  sodium chloride     sodium chloride Stopped (05/24/2020 2338)   albumin  human 25 g (05/27/20 0643)   cefTRIAXone (ROCEPHIN)  IV 2 g (05/27/20 1008)     LOS: 2 days    Time spent: 30 min    Desma Maxim, MD [facility]   If 7PM-7AM, please contact night-coverage [email or web address] Password [facility] 05/27/2020, 11:52 AM

## 2020-05-28 DIAGNOSIS — C220 Liver cell carcinoma: Secondary | ICD-10-CM

## 2020-05-28 LAB — COMPREHENSIVE METABOLIC PANEL
ALT: 16 U/L (ref 0–44)
AST: 60 U/L — ABNORMAL HIGH (ref 15–41)
Albumin: 4 g/dL (ref 3.5–5.0)
Alkaline Phosphatase: 75 U/L (ref 38–126)
Anion gap: 11 (ref 5–15)
BUN: 32 mg/dL — ABNORMAL HIGH (ref 8–23)
CO2: 27 mmol/L (ref 22–32)
Calcium: 9 mg/dL (ref 8.9–10.3)
Chloride: 96 mmol/L — ABNORMAL LOW (ref 98–111)
Creatinine, Ser: 0.63 mg/dL (ref 0.61–1.24)
GFR calc Af Amer: >60 mL/min (ref >60)
GFR calc non Af Amer: >60 mL/min (ref >60)
Glucose, Bld: 102 mg/dL — ABNORMAL HIGH (ref 70–99)
Potassium: 4.2 mmol/L (ref 3.5–5.1)
Sodium: 134 mmol/L — ABNORMAL LOW (ref 135–145)
Total Bilirubin: 4.3 mg/dL — ABNORMAL HIGH (ref 0.3–1.2)
Total Protein: 6.1 g/dL — ABNORMAL LOW (ref 6.5–8.1)

## 2020-05-28 LAB — CBC
HCT: 34.2 % — ABNORMAL LOW (ref 39.0–52.0)
Hemoglobin: 11.5 g/dL — ABNORMAL LOW (ref 13.0–17.0)
MCH: 32.5 pg (ref 26.0–34.0)
MCHC: 33.6 g/dL (ref 30.0–36.0)
MCV: 96.6 fL (ref 80.0–100.0)
Platelets: 25 10*3/uL — CL (ref 150–400)
RBC: 3.54 MIL/uL — ABNORMAL LOW (ref 4.22–5.81)
RDW: 19 % — ABNORMAL HIGH (ref 11.5–15.5)
WBC: 3.2 10*3/uL — ABNORMAL LOW (ref 4.0–10.5)
nRBC: 0 % (ref 0.0–0.2)

## 2020-05-28 MED ORDER — POLYETHYLENE GLYCOL 3350 17 G PO PACK: 17.0000 g | PACK | Freq: Every day | ORAL | Status: DC | PRN

## 2020-05-28 MED ORDER — POLYETHYLENE GLYCOL 3350 17 G PO PACK: 17.0000 g | PACK | Freq: Once | ORAL | Status: DC

## 2020-05-28 MED ORDER — SODIUM CHLORIDE 0.9 % IV SOLN
2.0000 g | Freq: Once | INTRAVENOUS | Status: AC
  Administered 2020-05-29: 2 g via INTRAVENOUS
  Filled 2020-05-28: qty 2

## 2020-05-28 MED ORDER — DM-GUAIFENESIN ER 30-600 MG PO TB12
1.0000 | ORAL_TABLET | Freq: Two times a day (BID) | ORAL | Status: DC | PRN
  Administered 2020-05-28: 1 via ORAL
  Filled 2020-05-28: qty 1

## 2020-05-28 MED ORDER — DM-GUAIFENESIN ER 30-600 MG PO TB12: 1.0000 | ORAL_TABLET | Freq: Two times a day (BID) | ORAL | Status: DC

## 2020-05-28 NOTE — Care Management Important Message (Signed)
Important Message  Patient Details  Name: John [name] MRN: [id number] Date of Birth: 1956-03-06   Medicare Important Message Given:  Yes     Dannette Barbara 05/28/2020, 11:43 AM

## 2020-05-28 NOTE — Progress Notes (Signed)
PROGRESS NOTE    John [name]   PRF:[id number]  DOB: 06/05/1956  PCP: Cletis Athens, MD    DOA: 05/14/2020 LOS: 3   Brief Narrative   John [name] is a 64 y.o. 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     Patient receives all his oncological care at [facility] and had requested transfer to [facility].  They have no beds available at this time and patient will be admitted to [facility] pending bed availability at [facility].  Evaluation revealed SBP with peritoneal ascitic fluid studies with > 250 PMN's, AKI with Cr 0.70>>1.90, and portal vein thrombus (pt poor candidate for anticoagulation due to thrombocytopenia).     Admitted to hospitalist service with GI and nephrology consulted.  On Rocephin for SBP.    On transfer list for [facility], pending an available bed.      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   Cultures growing streptococcal mitis/oralis sensitive to PCNs and Rocephin. --Continue Rocephin (9/19 >> ), plan for 5 day course --Consider repeat therapeutic paracentesis (pt declined previously) --Per chart review, on prophylactic Cipro as outpatient (organism is  resistant)  Acute kidney injury - present on admission.  Resolved with albumin and holding home diuretics and treatment of SBP. --Off albumin, consider re-dose on 9/23 @ 1 g/kg body weight --Resumed on home Lasix & spironolactone --monitor BMP --avoid nephrotoxins and hypotension, renally dose meds as indicated  Generalized weakness - PT and OT evaluations pending.  Due to advanced malignancy and above acute illness.  Hyponatremia - mild, stable, 2/2 cirrhosis.  Monitor BMP.    Cirrhosis Hepatic encephalopathy - mild confusion, persistent today.  Has been resumed on home lactulose, but refusing to take again this AM.  Monitor.    Portal vein thrombus - present on admission, seen on CT abdomen/pelvis.   Patient is a noted candidate for anticoagulation due to severe thrombocytopenia.  Thrombocytopenia - present on admission, stable.  Plt 19,000 with no evidence of bleeding.  Secondary to malignancy and chemotherapy.  Oncology following.  Monitor CBC and for signs of bleeding.   SCDs for VTE prophylaxis.  Hypoxia - requiring 2 L/min supplemental oxygen, not on home oxygen.   Patient is asymptomatic from respiratory standpoint, CXR clear of infiltrates and lungs clear on exam.   --continue supplemental o2, maintain sat >90%, wean as tolerated  MixedHCC / Cholangiocarcinoma s/p Y90 radioembolization in 2019 followed by Gem/Cis and capecitabine monotherapy with disease progression currently on Bev/Atezo. Last dose on 03/26/2020.  Next chemo planned for 9/22 @ [facility], will likely need to hold until infection resolved. --Oncology following --On wait-list for transfer to [place] per patient/family request  Abdominal wall ulcer - located RLQ, was treated for abdominal cellulitis recently at [facility].  Appears this may be a chronic wound at  site of peritoneal catheter.  WOC consulted.  Monitor.   History of seizure disorder - appears pt not on antiepileptic medication.  Seizure precautions.   PRN Ativan if seizure activity.  Anxiety disorder - continue PRN Xanax  Chronic pain - continue PRN Dilaudid    DVT prophylaxis: Place and maintain sequential compression device Start: 05/26/20 0823 SCDs Start: 05/28/2020 0935   Diet:  Diet Orders (From admission, onward)    Start     Ordered   05/31/2020 0936  Diet 2 gram sodium Room service appropriate? Yes; Fluid consistency: Thin  Diet effective now       Question Answer Comment  Room service appropriate? Yes   Fluid consistency: Thin      05/20/2020 0936            Code Status: Full Code    Subjective 05/28/20    Patient sleeping but would open eyes only briefly during encounter.  Nonverbal.  Shook head no when asked if pain, fever, worsened abdominal pain.  No acute events reported.   Disposition Plan & Communication   Status is: Inpatient  Remains inpatient appropriate because:IV treatments appropriate due to intensity of illness or inability to take PO   Dispo: The patient is from: Home              Anticipated d/c is to: Home              Anticipated d/c date is: 1 day              Patient currently is not medically stable to d/c.    Family Communication: none at bedside, will attempt to call.    Consults, Procedures, Significant Events   Consultants:   Oncology  Procedures:   None  Antimicrobials:  Anti-infectives (From admission, onward)   Start     Dose/Rate Route Frequency Ordered Stop   05/27/20 1600  rifaximin (XIFAXAN) tablet 550 mg        550 mg Oral 2 times daily 05/27/20 1506     05/27/20 0900  cefTRIAXone (ROCEPHIN) 2 g in sodium chloride 0.9 % 100 mL IVPB        2 g 200 mL/hr over 30 Minutes Intravenous Every 24 hours 05/26/20 1149     05/21/2020 1800  cefTRIAXone (ROCEPHIN) 2 g in sodium chloride 0.9 % 100 mL IVPB  Status:  Discontinued        2 g 200 mL/hr over 30 Minutes Intravenous Every 12 hours 05/20/2020 0936 05/26/20 1149   05/11/2020 0615  cefTRIAXone (ROCEPHIN) 2 g in sodium  chloride 0.9 % 100 mL IVPB        2 g 200 mL/hr over 30 Minutes Intravenous  Once 05/13/2020 0604 05/11/2020 0736        Objective   Vitals:   05/27/20 1148 05/27/20 1643 05/27/20 2030 05/28/20 0420  BP: 109/81 (!) 131/57 130/70 117/84  Pulse: 88 79 93 99  Resp: 20 19 19  (!) 23  Temp: 98.4 F (36.9 C) 98.2 F (36.8 C) 98.6 F (37 C) 97.9 F (36.6 C)  TempSrc: Oral  Oral Oral  SpO2: 97% 97% 92% 94%  Weight:      Height:        Intake/Output Summary (Last 24 hours) at 05/28/2020 0829 Last data filed at 05/27/2020 1523 Gross per 24 hour  Intake 120 ml  Output 3200 ml  Net -3080 ml   Filed Weights   05/22/2020 1813 05/26/20 0439 05/27/20 0417  Weight: 87.9 kg 87.3 kg 79.2 kg    Physical Exam:  General exam: sleeping comfortably, no acute distress, non-verbal, underweight Respiratory system: CTAB, normal respiratory effort. Cardiovascular system: normal S1/S2, RRR, no pedal edema.   Gastrointestinal system: soft / sunken abdomen, non-tender, +bowel sounds. Extremities: no cyanosis, normal tone Skin: dry, intact, normal temperature, normal color  Labs   Data Reviewed: I have personally reviewed following labs and imaging studies  CBC: Recent Labs  Lab 05/24/2020 0402 05/26/20 0632 05/27/20 0544 05/28/20 0627  WBC 4.6 5.9 5.2 3.2*  NEUTROABS 3.3  --   --   --   HGB 14.0 11.1* 11.3* 11.5*  HCT 40.8 33.5* 34.4* 34.2*  MCV 91.9 95.2 97.2 96.6  PLT 19* 19* 20* 25*   Basic Metabolic Panel: Recent Labs  Lab 05/31/2020 0402 05/26/20 0632 05/27/20 0544 05/28/20 0627  NA 130* 130* 134* 134*  K 4.4 4.5 4.9 4.2  CL 91* 93* 94* 96*  CO2 29 27 29 27   GLUCOSE 112* 115* 107* 102*  BUN 39* 39* 39* 32*  CREATININE 1.90* 1.20 0.91 0.63  CALCIUM 8.5* 8.5* 9.2 9.0   GFR: Estimated Creatinine Clearance: 103.7 mL/min (by C-G formula based on SCr of 0.63 mg/dL). Liver Function Tests: Recent Labs  Lab 05/17/2020 0402 05/27/20 0544 05/28/20 0627  AST 80* 61* 60*  ALT 20 14  16   ALKPHOS 119 74 75  BILITOT 3.1* 4.8* 4.3*  PROT 6.4* 6.5 6.1*  ALBUMIN 2.7* 4.5 4.0   Recent Labs  Lab 05/18/2020 0402  LIPASE 44   Recent Labs  Lab 06/05/2020 0413  AMMONIA 25   Coagulation Profile: No results for input(s): INR, PROTIME in the last 168 hours. Cardiac Enzymes: No results for input(s): CKTOTAL, CKMB, CKMBINDEX, TROPONINI in the last 168 hours. BNP (last 3 results) No results for input(s): PROBNP in the last 8760 hours. HbA1C: No results for input(s): HGBA1C in the last 72 hours. CBG: No results for input(s): GLUCAP in the last 168 hours. Lipid Profile: No results for input(s): CHOL, HDL, LDLCALC, TRIG, CHOLHDL, LDLDIRECT in the last 72 hours. Thyroid Function Tests: No results for input(s): TSH, T4TOTAL, FREET4, T3FREE, THYROIDAB in the last 72 hours. Anemia Panel: No results for input(s): VITAMINB12, FOLATE, FERRITIN, TIBC, IRON, RETICCTPCT in the last 72 hours. Sepsis Labs: Recent Labs  Lab 05/10/2020 0413 05/08/2020 0831  LATICACIDVEN 1.9 1.8    Recent Results (from the past 240 hour(s))  Peritoneal Fluid culture ( includes gram stain)     Status: Abnormal   Collection Time: 05/10/2020  4:09 AM   Specimen: Peritoneal Washings; Peritoneal Fluid  Result Value Ref Range Status   Specimen Description   Final    PERITONEAL Performed at [facility], [address], [place], [place] [address]    Special Requests   Final    Immunocompromised Performed at [facility], [address] [place], [place] [address]    Gram Stain   Final    RARE WBC PRESENT, PREDOMINANTLY MONONUCLEAR NO ORGANISMS SEEN Performed at [facility], [place] [address], [place],  [address]    Culture STREPTOCOCCUS MITIS/ORALIS (A)  Final   Report Status 05/27/2020 FINAL  Final   Organism ID, Bacteria STREPTOCOCCUS MITIS/ORALIS  Final      Susceptibility   Streptococcus mitis/oralis - MIC*    PENICILLIN <=0.06 SENSITIVE Sensitive     CEFTRIAXONE  <=0.12 SENSITIVE Sensitive     ERYTHROMYCIN 2 RESISTANT Resistant     LEVOFLOXACIN >=  16 RESISTANT Resistant     VANCOMYCIN 0.25 SENSITIVE Sensitive     * STREPTOCOCCUS MITIS/ORALIS  Blood culture (routine x 2)     Status: None (Preliminary result)   Collection Time: 05/27/2020  4:14 AM   Specimen: BLOOD  Result Value Ref Range Status   Specimen Description BLOOD RIGHT ANTECUBITAL  Final   Special Requests   Final    BOTTLES DRAWN AEROBIC AND ANAEROBIC Blood Culture adequate volume   Culture   Final    NO GROWTH 3 DAYS Performed at [facility], [address], [place], [place] [address]    Report Status PENDING  Incomplete  Blood culture (routine x 2)     Status: None (Preliminary result)   Collection Time: 05/19/2020  4:14 AM   Specimen: BLOOD  Result Value Ref Range Status   Specimen Description BLOOD BLOOD RIGHT HAND  Final   Special Requests   Final    BOTTLES DRAWN AEROBIC AND ANAEROBIC Blood Culture results may not be optimal due to an excessive volume of blood received in culture bottles   Culture   Final    NO GROWTH 3 DAYS Performed at [facility], [address], [place], [place] [address]    Report Status PENDING  Incomplete  SARS Coronavirus 2 by RT PCR (hospital order, performed in [place] hospital lab) Nasopharyngeal Nasopharyngeal Swab     Status: None   Collection Time: 05/13/2020  8:31 AM   Specimen: Nasopharyngeal Swab  Result Value Ref Range Status   SARS Coronavirus 2 NEGATIVE NEGATIVE Final    Comment: (NOTE) SARS-CoV-2 target nucleic acids are NOT DETECTED.  The SARS-CoV-2 RNA is generally detectable in upper and lower respiratory specimens during the acute phase of infection. The lowest concentration of SARS-CoV-2 viral copies this assay can detect is 250 copies / mL. A negative result does not preclude SARS-CoV-2 infection and should not be used as the sole basis for treatment or other patient management decisions.  A negative  result may occur with improper specimen collection / handling, submission of specimen other than nasopharyngeal swab, presence of viral mutation(s) within the areas targeted by this assay, and inadequate number of viral copies (<250 copies / mL). A negative result must be combined with clinical observations, patient history, and epidemiological information.  Fact Sheet for Patients:   StrictlyIdeas.no  Fact Sheet for Healthcare Providers: BankingDealers.co.za  This test is not yet approved or  cleared by the [place] FDA and has been authorized for detection and/or diagnosis of SARS-CoV-2 by FDA under an Emergency Use Authorization (EUA).  This EUA will remain in effect (meaning this test can be used) for the duration of the COVID-19 declaration under Section 564(b)(1) of the Act, 21 U.S.C. section 360bbb-3(b)(1), unless the authorization is terminated or revoked sooner.  Performed at [facility], [address] [place], [place] [address]       Imaging Studies   No results found.   Medications   Scheduled Meds: . baclofen  10 mg Oral TID  . collagenase   Topical Daily  . folic acid  1 mg Oral Daily  . furosemide  40 mg Oral Daily  . lactulose  20 g Oral BID  . loratadine  10 mg Oral Daily  . magnesium oxide  400 mg Oral Daily  . pantoprazole  40 mg Oral Daily  . rifaximin  550 mg Oral BID  . sodium chloride flush  3 mL Intravenous Q12H  . spironolactone  25 mg Oral Daily   Continuous Infusions: . sodium chloride    . sodium chloride Stopped (06/01/2020 2338)  . cefTRIAXone (ROCEPHIN)  IV 2 g (05/27/20 1008)       LOS: 3 days    Time spent: 30 minutes with > 50% spent in coordination of care and direct patient contact.    Ezekiel Slocumb, DO [facility]  05/28/2020, 8:29 AM    If 7PM-7AM, please contact night-coverage. How to contact the [facility] Attending or Consulting provider [place]  or covering provider during after hours [place], for this patient?    1. Check the care team in [facility] and look for a) attending/consulting [facility] provider listed and b) the [facility] team listed 2. Log into [email or web address] and use [place]'s universal password to access. If you do not have the password, please contact the hospital operator. 3. Locate the [facility] provider you are looking for under [facility] and page to a number that you can be directly reached. 4. If you still have difficulty reaching the provider, please page the [facility] (Director on Call) for the Hospitalists listed on amion for assistance.

## 2020-05-28 NOTE — Progress Notes (Signed)
PT Cancellation Note  Patient Details Name: John [name] MRN: [id number] DOB: 18-May-1956   Cancelled Treatment:    Reason Eval/Treat Not Completed: Other (comment) PT order received and chart reviewed. Pt lying in bed with HOB elevated and eyes partially open. Pt required increased time to respond verbally to clinician and stated that his abdomen was currently hurting 5/10. Pt noted to be drifting off to sleep during questioning portion of evaluation. Attempted to rouse pt with verbal and tactile stimuli however pt sleeping soundly. Will attempt another date/time when pt actively able to participate.   [place] 05/28/2020, 4:36 PM

## 2020-05-28 NOTE — Hospital Course (Addendum)
Summary from Dr. Billie Ruddy 10/5.  Reviewed records and agree with summary: "John [name] is a 64 y.o. 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      Patient receives all his oncological care at [facility] and had requested transfer to [facility].  However, there were no beds available so he was managed at [facility].   He was found to have spontaneous bacterial peritonitis.  Ascitic fluid WBC was consistent with SBP and ascitic fluid culture showed Streptococcus mitis/oralis. He completed 5-day course of IV Rocephin on 05/29/2020.  He was previously on ciprofloxacin for SBP prophylaxis and this has been resumed.   He has mixed hepatocellular carcinoma/cholangiocarcinoma and he is status post Y 90 radioembolization in 2019 followed by Gem/Cis and capecitabine monotherapy with disease progression currently on Bev/Atezo. Last dose on 03/26/2020.  Next chemo was planned for 9/22 @ [facility] but this has been postponed because of recent acute illness.  He has a right lower quadrant percutaneous drain for drainage of ascitic fluid 3 times a week on Tuesdays, Thursdays and Saturdays.   He was also treated with lactulose and rifaximin for hepatic encephalopathy.  He has portal vein thrombosis but unfortunately he is not a candidate for anticoagulation because of severe thrombocytopenia.   He was evaluated by PT and OT recommended further rehabilitation at a skilled nursing facility."  Awaiting SNF placement which has been complicated by the need for transportation to oncology appointments.

## 2020-05-29 DIAGNOSIS — L8990 Pressure ulcer of unspecified site, unspecified stage: Secondary | ICD-10-CM | POA: Insufficient documentation

## 2020-05-29 LAB — COMPREHENSIVE METABOLIC PANEL
ALT: 19 U/L (ref 0–44)
AST: 71 U/L — ABNORMAL HIGH (ref 15–41)
Albumin: 4.1 g/dL (ref 3.5–5.0)
Alkaline Phosphatase: 87 U/L (ref 38–126)
Anion gap: 11 (ref 5–15)
BUN: 32 mg/dL — ABNORMAL HIGH (ref 8–23)
CO2: 29 mmol/L (ref 22–32)
Calcium: 8.9 mg/dL (ref 8.9–10.3)
Chloride: 96 mmol/L — ABNORMAL LOW (ref 98–111)
Creatinine, Ser: 0.74 mg/dL (ref 0.61–1.24)
GFR calc Af Amer: >60 mL/min (ref >60)
GFR calc non Af Amer: >60 mL/min (ref >60)
Glucose, Bld: 105 mg/dL — ABNORMAL HIGH (ref 70–99)
Potassium: 4.3 mmol/L (ref 3.5–5.1)
Sodium: 136 mmol/L (ref 135–145)
Total Bilirubin: 3.6 mg/dL — ABNORMAL HIGH (ref 0.3–1.2)
Total Protein: 6.2 g/dL — ABNORMAL LOW (ref 6.5–8.1)

## 2020-05-29 LAB — CBC
HCT: 37.9 % — ABNORMAL LOW (ref 39.0–52.0)
Hemoglobin: 12.7 g/dL — ABNORMAL LOW (ref 13.0–17.0)
MCH: 31.3 pg (ref 26.0–34.0)
MCHC: 33.5 g/dL (ref 30.0–36.0)
MCV: 93.3 fL (ref 80.0–100.0)
Platelets: 27 10*3/uL — CL (ref 150–400)
RBC: 4.06 MIL/uL — ABNORMAL LOW (ref 4.22–5.81)
RDW: 19 % — ABNORMAL HIGH (ref 11.5–15.5)
WBC: 3.5 10*3/uL — ABNORMAL LOW (ref 4.0–10.5)
nRBC: 0 % (ref 0.0–0.2)

## 2020-05-29 MED ORDER — HYDROMORPHONE HCL 1 MG/ML IJ SOLN
0.5000 mg | INTRAMUSCULAR | Status: DC | PRN
  Administered 2020-05-29 – 2020-06-04 (×6): 0.5 mg via INTRAVENOUS
  Filled 2020-05-29 (×6): qty 1

## 2020-05-29 NOTE — Progress Notes (Signed)
1882mL drained from Pleurx catheter at 1600. Stopped due to pts pain level and pts request to stop. New dressing applied. Will continue to monitor.

## 2020-05-29 NOTE — Progress Notes (Signed)
PROGRESS NOTE    John [name]   HYQ:[id number]  DOB: 1956/04/18  PCP: Cletis Athens, MD    DOA: 06/04/2020 LOS: 4   Brief Narrative   NATION CRADLE is a 64 y.o. 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     Patient receives all his oncological care at [facility] and had requested transfer to [facility].  They have no beds available at this time and patient will be admitted to [facility] pending bed availability at [facility].  Evaluation revealed SBP with peritoneal ascitic fluid studies with > 250 PMN's, AKI with Cr 0.70>>1.90, and portal vein thrombus (pt poor candidate for anticoagulation due to thrombocytopenia).     Admitted to hospitalist service with GI and nephrology consulted.  On Rocephin for SBP.    On transfer list for [facility], pending an available bed.      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   Cultures growing streptococcal mitis/oralis sensitive to PCNs and Rocephin. --Completed 5 day course Rocephin (9/19 >>9/23) --Has peritoneal catheter in place, drain 3 times weekly Tu/Th/Sat --Per chart review, on prophylactic Cipro as outpatient (organism is  resistant)  Acute kidney injury - present on admission.   Resolved with albumin and holding home diuretics and treatment of SBP. --Off albumin, consider re-dose on 9/23 @ 1 g/kg body weight --Resumed on home Lasix & spironolactone --monitor BMP --avoid nephrotoxins and hypotension, renally dose meds as indicated  Generalized weakness - PT and OT evaluations pending.  Due to advanced malignancy and above acute illness.  Hyponatremia - mild, stable, 2/2 cirrhosis.  Monitor BMP.    Cirrhosis Hepatic encephalopathy - mild confusion, persistent today.  Has been resumed on home lactulose, but refusing to take again this AM.  Monitor.    Portal vein thrombus - present on admission, seen on CT abdomen/pelvis.   Patient is a noted candidate for anticoagulation due to severe thrombocytopenia.  Thrombocytopenia - present on admission, stable.  Plt 19,000 with no evidence of bleeding.  Secondary to malignancy and chemotherapy.  Oncology following.  Monitor CBC and for signs of bleeding.   SCDs for VTE prophylaxis.  Hypoxia - requiring 2 L/min supplemental oxygen, not on home oxygen.   Patient is asymptomatic from respiratory standpoint, CXR clear of infiltrates and lungs clear on exam.   --continue supplemental o2, maintain sat >90%, wean as tolerated  MixedHCC / Cholangiocarcinoma s/p Y90 radioembolization in 2019 followed by Gem/Cis and capecitabine monotherapy with disease progression currently on Bev/Atezo. Last dose on 03/26/2020.  Next chemo planned for 9/22 @ [facility], will likely need to hold until infection resolved. --Oncology following --On wait-list for transfer to [place] per patient/family request  Abdominal wall ulcer - located RLQ, was treated for abdominal cellulitis recently at [facility].  Appears this may be a chronic  wound at site of peritoneal catheter.  WOC consulted.  Monitor.   History of seizure disorder - appears pt not on antiepileptic medication.  Seizure precautions.   PRN Ativan if seizure activity.  Anxiety disorder - continue PRN Xanax  Chronic pain - continue PRN Dilaudid    DVT prophylaxis: Place and maintain sequential compression device Start: 05/26/20 0823 SCDs Start: 05/12/2020 0935   Diet:  Diet Orders (From admission, onward)    Start     Ordered   05/15/2020 0936  Diet 2 gram sodium Room service appropriate? Yes; Fluid consistency: Thin  Diet effective now       Question Answer Comment  Room service appropriate? Yes   Fluid consistency: Thin      06/05/2020 0936            Code Status: Full Code    Subjective 05/29/20    Patient again very drowsy when seen this AM, but was more talkative and answered questions today.  Reports ongoing abdominal pain without nausea/vomiting.  Says he lives alone, near [place].  Denies fever/chills.   Disposition Plan & Communication   Status is: Inpatient  Remains inpatient appropriate because:IV treatments appropriate due to intensity of illness or inability to take PO.  Patient lives alone, has advanced cancer and generally weak - PT and OT evaluations are pending.     Dispo: The patient is from: Home              Anticipated d/c is to: Home              Anticipated d/c date is: 1 day              Patient currently is not medically stable to d/c.    Family Communication: none at bedside, will attempt to call.    Consults, Procedures, Significant Events   Consultants:   Oncology  Procedures:   None  Antimicrobials:  Anti-infectives (From admission, onward)   Start     Dose/Rate Route Frequency Ordered Stop   05/29/20 1300  cefTRIAXone (ROCEPHIN) 2 g in sodium chloride 0.9 % 100 mL IVPB        2 g 200 mL/hr over 30 Minutes Intravenous  Once 05/28/20 1354     05/27/20 1600  rifaximin (XIFAXAN) tablet 550 mg        550 mg Oral 2 times daily 05/27/20 1506     05/27/20 0900  cefTRIAXone (ROCEPHIN) 2 g in sodium chloride 0.9 % 100 mL IVPB  Status:  Discontinued        2 g 200  mL/hr over 30 Minutes Intravenous Every 24 hours 05/26/20 1149 05/28/20 1302   06/05/2020 1800  cefTRIAXone (ROCEPHIN) 2 g in sodium chloride 0.9 % 100 mL IVPB  Status:  Discontinued        2 g 200 mL/hr over 30 Minutes Intravenous Every 12 hours 05/15/2020 0936 05/26/20 1149   05/30/2020 0615  cefTRIAXone (ROCEPHIN) 2 g in sodium chloride 0.9 % 100 mL IVPB        2 g 200 mL/hr over 30 Minutes Intravenous  Once 05/31/2020 0604 06/01/2020 0736        Objective   Vitals:   05/28/20 1924 05/29/20 0012 05/29/20 0507 05/29/20 0730  BP: 126/66 123/69 121/75 (!) 121/54  Pulse: 82 91 93 88  Resp: 18 18 18 17   Temp: 98.8 F (37.1 C) 98.4 F (36.9 C) 97.8 F (36.6 C) 98.1 F (36.7 C)  TempSrc: Oral  Oral Oral  SpO2: 97% 94% 94% 98%  Weight:   74.3 kg   Height:        Intake/Output Summary (Last 24 hours) at 05/29/2020 0743 Last data filed at 05/29/2020 0507 Gross per 24 hour  Intake 320 ml  Output 700 ml  Net -380 ml   Filed Weights   05/26/20 0439 05/27/20 0417 05/29/20 0507  Weight: 87.3 kg 79.2 kg 74.3 kg    Physical Exam:  General exam: awake but drowsy, no acute distress, underweight Respiratory system: CTAB, normal respiratory effort. Cardiovascular system: normal S1/S2, RRR, no pedal edema.   Gastrointestinal system: soft but getting more distended, non-tender on palpation, +bowel sounds. Extremities: no cyanosis, normal tone Skin: dry, intact, normal temperature Psychiatry: normal mood, flat affect  Labs   Data Reviewed: I have personally reviewed following labs and imaging studies  CBC: Recent Labs  Lab 06/04/2020 0402 05/26/20 0632 05/27/20 0544 05/28/20 0627 05/29/20 0441  WBC 4.6 5.9 5.2 3.2* 3.5*  NEUTROABS 3.3  --   --   --   --   HGB 14.0 11.1* 11.3* 11.5* 12.7*  HCT 40.8 33.5* 34.4* 34.2* 37.9*  MCV 91.9 95.2 97.2 96.6 93.3  PLT 19* 19* 20* 25* 27*   Basic Metabolic Panel: Recent Labs  Lab 05/15/2020 0402 05/26/20 0632 05/27/20 0544 05/28/20 0627  05/29/20 0441  NA 130* 130* 134* 134* 136  K 4.4 4.5 4.9 4.2 4.3  CL 91* 93* 94* 96* 96*  CO2 29 27 29 27 29   GLUCOSE 112* 115* 107* 102* 105*  BUN 39* 39* 39* 32* 32*  CREATININE 1.90* 1.20 0.91 0.63 0.74  CALCIUM 8.5* 8.5* 9.2 9.0 8.9   GFR: Estimated Creatinine Clearance: 99.3 mL/min (by C-G formula based on SCr of 0.74 mg/dL). Liver Function Tests: Recent Labs  Lab 06/01/2020 0402 05/27/20 0544 05/28/20 0627 05/29/20 0441  AST 80* 61* 60* 71*  ALT 20 14 16 19   ALKPHOS 119 74 75 87  BILITOT 3.1* 4.8* 4.3* 3.6*  PROT 6.4* 6.5 6.1* 6.2*  ALBUMIN 2.7* 4.5 4.0 4.1   Recent Labs  Lab 05/13/2020 0402  LIPASE 44   Recent Labs  Lab 05/08/2020 0413  AMMONIA 25   Coagulation Profile: No results for input(s): INR, PROTIME in the last 168 hours. Cardiac Enzymes: No results for input(s): CKTOTAL, CKMB, CKMBINDEX, TROPONINI in the last 168 hours. BNP (last 3 results) No results for input(s): PROBNP in the last 8760 hours. HbA1C: No results for input(s): HGBA1C in the last 72 hours. CBG: No results for input(s): GLUCAP in the last 168 hours. Lipid Profile: No results for input(s): CHOL, HDL, LDLCALC, TRIG, CHOLHDL, LDLDIRECT in the last 72 hours. Thyroid Function Tests: No results for input(s): TSH, T4TOTAL, FREET4, T3FREE, THYROIDAB in the last 72 hours. Anemia Panel: No results for input(s): VITAMINB12, FOLATE, FERRITIN, TIBC, IRON, RETICCTPCT in the last 72 hours. Sepsis Labs: Recent Labs  Lab 05/22/2020 0413 06/04/2020 0831  LATICACIDVEN 1.9 1.8    Recent Results (from the past 240 hour(s))  Peritoneal Fluid culture ( includes gram stain)     Status: Abnormal   Collection Time: 05/26/2020  4:09 AM   Specimen: Peritoneal Washings; Peritoneal Fluid  Result Value Ref Range Status   Specimen Description   Final    PERITONEAL Performed at [facility], [address] [place],  [address]    Special Requests   Final    Immunocompromised Performed at  [facility], [place],  [place], [place] [address]    Gram Stain   Final    RARE WBC PRESENT, PREDOMINANTLY MONONUCLEAR NO ORGANISMS SEEN Performed at [facility], [place] [address], [place], [place] [address]    Culture STREPTOCOCCUS MITIS/ORALIS (A)  Final   Report Status 05/27/2020 FINAL  Final   Organism ID, Bacteria STREPTOCOCCUS MITIS/ORALIS  Final      Susceptibility   Streptococcus mitis/oralis - MIC*    PENICILLIN <=0.06 SENSITIVE Sensitive     CEFTRIAXONE <=0.12 SENSITIVE Sensitive     ERYTHROMYCIN 2 RESISTANT Resistant     LEVOFLOXACIN >=16 RESISTANT Resistant     VANCOMYCIN 0.25 SENSITIVE Sensitive     * STREPTOCOCCUS MITIS/ORALIS  Blood culture (routine x 2)     Status: None (Preliminary result)   Collection Time: 05/29/2020  4:14 AM   Specimen: BLOOD  Result Value Ref Range Status   Specimen Description BLOOD RIGHT ANTECUBITAL  Final   Special Requests   Final    BOTTLES DRAWN AEROBIC AND ANAEROBIC Blood Culture adequate volume   Culture   Final    NO GROWTH 4 DAYS Performed at [facility], [address] [place], [place] [address]    Report Status PENDING  Incomplete  Blood culture (routine x 2)     Status: None (Preliminary result)   Collection Time: 05/14/2020  4:14 AM   Specimen: BLOOD  Result Value Ref Range Status   Specimen Description BLOOD BLOOD RIGHT HAND  Final   Special Requests   Final    BOTTLES DRAWN AEROBIC AND ANAEROBIC Blood Culture results may not be optimal due to an excessive volume of blood received in culture bottles   Culture   Final    NO GROWTH 4 DAYS Performed at [facility], [address] [place], [place] [address]    Report Status PENDING  Incomplete  SARS Coronavirus 2 by RT PCR (hospital order, performed in [place] hospital lab) Nasopharyngeal Nasopharyngeal Swab     Status: None   Collection Time: 05/21/2020  8:31 AM   Specimen: Nasopharyngeal Swab  Result Value Ref Range Status    SARS Coronavirus 2 NEGATIVE NEGATIVE Final    Comment: (NOTE) SARS-CoV-2 target nucleic acids are NOT DETECTED.  The SARS-CoV-2 RNA is generally detectable in upper and lower respiratory specimens during the acute phase of infection. The lowest concentration of SARS-CoV-2 viral copies this assay can detect is 250 copies / mL. A negative result does not preclude SARS-CoV-2 infection and should not be used as the sole basis for treatment or other patient management decisions.  A negative result may occur with improper specimen collection / handling, submission of specimen other than nasopharyngeal swab, presence of viral mutation(s) within the areas targeted by this assay, and inadequate number of viral copies (<250 copies / mL). A negative result must be combined with clinical observations, patient history, and epidemiological information.  Fact Sheet for Patients:   StrictlyIdeas.no  Fact Sheet for Healthcare Providers: BankingDealers.co.za  This test is not yet approved or  cleared by the [place] FDA and has been authorized for detection and/or diagnosis of SARS-CoV-2 by FDA under an Emergency Use Authorization (EUA).  This EUA will remain in effect (meaning this test can be used) for the duration of the COVID-19 declaration under Section 564(b)(1) of the Act, 21 U.S.C. section 360bbb-3(b)(1), unless the authorization is terminated or revoked sooner.  Performed at [facility], [address], [place], [place] [address]  Imaging Studies   No results found.   Medications   Scheduled Meds: . baclofen  10 mg Oral TID  . collagenase   Topical Daily  . folic acid  1 mg Oral Daily  . furosemide  40 mg Oral Daily  . lactulose  20 g Oral BID  . loratadine  10 mg Oral Daily  . magnesium oxide  400 mg Oral Daily  . pantoprazole  40 mg Oral Daily  . polyethylene glycol  17 g Oral Once  . rifaximin  550 mg  Oral BID  . sodium chloride flush  3 mL Intravenous Q12H  . spironolactone  25 mg Oral Daily   Continuous Infusions: . sodium chloride    . sodium chloride Stopped (05/07/2020 2338)  . cefTRIAXone (ROCEPHIN)  IV         LOS: 4 days    Time spent: 30 minutes with > 50% spent in coordination of care and direct patient contact.    Ezekiel Slocumb, DO [facility]  05/29/2020, 7:43 AM    If 7PM-7AM, please contact night-coverage. How to contact the [facility] Attending or Consulting provider [place] or covering provider during after hours [place], for this patient?    1. Check the care team in [facility] and look for a) attending/consulting TRH provider listed and b) the [facility] team listed 2. Log into [email or web address] and use [place]'s universal password to access. If you do not have the password, please contact the hospital operator. 3. Locate the [facility] provider you are looking for under [facility] and page to a number that you can be directly reached. 4. If you still have difficulty reaching the provider, please page the [facility] (Director on Call) for the Hospitalists listed on amion for assistance.

## 2020-05-29 NOTE — Progress Notes (Signed)
PT Cancellation Note  Patient Details Name: John [name] MRN: [id number] DOB: [birth year]   Cancelled Treatment:    Reason Eval/Treat Not Completed: Other (comment) Pt received lying in bed with nursing student reporting that he just returned to bed and was complaining of pain right now. Pt initially answering questions then became irritable/agitated and stated "I can't do that right now" unwilling to participate in strength/ROM testing. Pt then closed his eyes and stopped communicating with clinician. Will attempt another date/time if pt agreeable.   [place] 05/29/2020, 3:31 PM

## 2020-05-29 NOTE — Evaluation (Signed)
Occupational Therapy Evaluation Patient Details Name: John [name] MRN: [id number] DOB: Mar 12, 1956 Today's Date: 05/29/2020    History of Present Illness 64 y.o. 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 Prior to hospital admission, pt reports being independent with basic ADL tasks and using a SPC for mobility. Pt lives by himself in a mobile home with 3 steps to enter and reports that he "probably" has some help at home but unable to verbalize who when asked. Pt received seated on [facility] after toileting and requiring assist for pericare upon OT's arrival. (OT coordinated with nurse tech who assisted him to [facility] prior to session). Pt required Max A for pericare while pt was in standing requiring BUE support on RW. Buttocks noted to be red and with small possible skin tear; RN notified at end of session. Pt required CGA for ADL transfers and for turning with RW to sit EOB. Min A for BLE mgt back to bed. Pt endorsed abdominal pain/discomfort throughout session. Currently pt demonstrates impairments as described below (See OT problem list) which functionally limit his ability to perform ADL/self-care tasks at baseline independence. Pt currently requires CGA for ADL transfers, Max A for pericare, and Min A for LB ADL Tasks.  Pt would benefit from skilled OT services to address noted impairments and functional limitations (see below for any additional details) in order to maximize safety and independence while minimizing falls risk and caregiver burden. Upon hospital discharge, recommend HHOT to maximize pt safety and return to  functional independence during meaningful occupations of daily life, pending additional progress while hospitalized.     Follow Up Recommendations  Supervision - Intermittent;Home health OT (pending progress)    Equipment Recommendations  3 in 1 bedside commode    Recommendations for Other Services       Precautions / Restrictions Precautions Precautions: Fall Restrictions Weight Bearing Restrictions: No      Mobility Bed Mobility Overal bed mobility: Needs Assistance Bed Mobility: Sit to Supine       Sit to supine: Min assist   General bed mobility comments: Min A for BLE mgt  Transfers Overall transfer level: Needs assistance Equipment used: Rolling walker (2 wheeled) Transfers: Sit to/from Stand Sit to Stand: Min guard              Balance Overall balance assessment: Needs assistance Sitting-balance support: No upper extremity supported;Feet supported Sitting balance-Leahy Scale: Fair     Standing balance support: Bilateral upper extremity supported;During functional activity Standing balance-Leahy Scale: Fair                             ADL either performed or assessed with clinical judgement   ADL Overall ADL's : Needs assistance/impaired                                       General ADL Comments: Pt requires Max A for pericare in standing, Min-Mod A for LB ADL tasks (limited by abdominal pain/distension), CGA for ADL transfers with RW     Vision Baseline  Vision/History: Wears glasses Wears Glasses: At all times Patient Visual Report: Diplopia Vision Assessment?: Yes Additional Comments: Pt endorses double vision at end of session, sees double of the tv screen side by side, but able to correctly identify number of fingers held up within 2 ft and 106ft; RN notified     Perception     Praxis      Pertinent Vitals/Pain Pain Assessment: Faces Faces Pain Scale: Hurts even more Pain Location: abdomen - pt states is  hurts "a whole lot" but won't provide a number Pain Descriptors / Indicators: Aching;Grimacing;Guarding Pain Intervention(s): Limited activity within patient's tolerance;Monitored during session;Repositioned     Hand Dominance Right   Extremity/Trunk Assessment Upper Extremity Assessment Upper Extremity Assessment: Generalized weakness   Lower Extremity Assessment Lower Extremity Assessment: Generalized weakness       Communication Communication Communication: HOH   Cognition   Behavior During Therapy: WFL for tasks assessed/performed Overall Cognitive Status: No family/caregiver present to determine baseline cognitive functioning                                 General Comments: Pt alert and oriented at start of session while toileting but once back to bed got significantly fatigued/lethargic requiring tactile and verbal cues to help answer remaining evaluation questions   General Comments  redness noted on sacrum/buttocks, sacral foam pad in place    Exercises Other Exercises Other Exercises: toileting task with BSC, cues for hand placement in standing, max a pericare   Shoulder Instructions      Home Living Family/patient expects to be discharged to:: Private residence Living Arrangements: Alone Available Help at Discharge:  (pt states is "probably" has help but does not verbalize who when asked) Type of Home: Mobile home Home Access: Stairs to enter Entrance Stairs-Number of Steps: 3   Home Layout: One level               Home Equipment: Cane - single point   Additional Comments: limited DME/AE home/PLOF 2/2 pt's fatigue/lethargy once back to bed      Prior Functioning/Environment Level of Independence: Independent with assistive device(s)        Comments: Pt reports using SPC for mobility, indep with basic ADL but reports he could use some help with cooking/cleaning; denies falls        OT Problem List: Decreased  strength;Pain;Decreased cognition;Decreased activity tolerance;Impaired balance (sitting and/or standing);Decreased knowledge of use of DME or AE;Impaired vision/perception      OT Treatment/Interventions: Self-care/ADL training;Therapeutic exercise;Therapeutic activities;Energy conservation;DME and/or AE instruction;Patient/family education;Balance training    OT Goals(Current goals can be found in the care plan section) Acute Rehab OT Goals Patient Stated Goal: have less abdominal pain OT Goal Formulation: With patient Time For Goal Achievement: 06/12/20 Potential to Achieve Goals: Good ADL Goals Pt Will Perform Lower Body Dressing: with min assist;sit to/from stand Pt Will Transfer to Toilet: with supervision;ambulating;bedside commode (LRAD for amb) Additional ADL Goal #1: Pt will verbalize plan to implement at least 1 learned falls prevention strategy to maximize safety with ADL  OT Frequency: Min 1X/week   Barriers to D/C:            Co-evaluation              AM-PAC OT "6 Clicks" Daily Activity     Outcome Measure Help from another person eating meals?: None Help from another person taking care of personal grooming?:  None Help from another person toileting, which includes using toliet, bedpan, or urinal?: A Little Help from another person bathing (including washing, rinsing, drying)?: A Little Help from another person to put on and taking off regular upper body clothing?: None Help from another person to put on and taking off regular lower body clothing?: A Little 6 Click Score: 21   End of Session Nurse Communication: Other (comment) (redness on buttocks, possible skin tear, double vision, lethargy)  Activity Tolerance: Patient tolerated treatment well Patient left: in bed;with call bell/phone within reach;with bed alarm set;Other (comment) (new condom catheter applied by NT, heels floated)  OT Visit Diagnosis: Other abnormalities of gait and mobility  (R26.89);Muscle weakness (generalized) (M62.81);Pain Pain - part of body:  (abdomen)                Time: 1031-1050 OT Time Calculation (min): 19 min Charges:  OT General Charges $OT Visit: 1 Visit OT Evaluation $OT Eval Moderate Complexity: 1 Mod OT Treatments $Self Care/Home Management : 8-22 mins  Jeni Salles, MPH, MS, OTR/L ascom [phone] 05/29/20, 11:33 AM

## 2020-05-29 NOTE — Progress Notes (Signed)
The patient continues to have pain in his abdomen from his spontaneous pectoral peritonitis and ascites with cholangiocarcinoma and hepatocellular carcinoma.  The patient is not a candidate for diuretics due to his kidney issues and his tense ascites has improved.  He does report that his abdominal pain has also improved but is still present.  The patient has end-stage liver disease and there is nothing further to add from a GI point of view.  The patient should follow-up with his [facility] doctors at discharge.  I will sign off.  Please call if any further GI concerns or questions.  We would like to thank you for the opportunity to participate in the care of John [name].

## 2020-05-29 NOTE — Progress Notes (Signed)
PT Cancellation Note  Patient Details Name: John [name] MRN: [id number] DOB: 1955/11/18   Cancelled Treatment:    Reason Eval/Treat Not Completed: Other (comment). 2 attempts made for evaluation this date. First attempt pt eating breakfast. 2nd attempt, pt very lethargic and hard to rouse. Will re-attempt in PM.   Brooks Stotz 05/29/2020, 11:52 AM  Greggory Stallion, PT, DPT [phone]

## 2020-05-30 DIAGNOSIS — G40909 Epilepsy, unspecified, not intractable, without status epilepticus: Secondary | ICD-10-CM

## 2020-05-30 DIAGNOSIS — I81 Portal vein thrombosis: Secondary | ICD-10-CM

## 2020-05-30 DIAGNOSIS — Z515 Encounter for palliative care: Secondary | ICD-10-CM

## 2020-05-30 DIAGNOSIS — Z7189 Other specified counseling: Secondary | ICD-10-CM

## 2020-05-30 LAB — COMPREHENSIVE METABOLIC PANEL
ALT: 21 U/L (ref 0–44)
AST: 71 U/L — ABNORMAL HIGH (ref 15–41)
Albumin: 3.6 g/dL (ref 3.5–5.0)
Alkaline Phosphatase: 89 U/L (ref 38–126)
Anion gap: 10 (ref 5–15)
BUN: 26 mg/dL — ABNORMAL HIGH (ref 8–23)
CO2: 29 mmol/L (ref 22–32)
Calcium: 8.6 mg/dL — ABNORMAL LOW (ref 8.9–10.3)
Chloride: 100 mmol/L (ref 98–111)
Creatinine, Ser: 0.68 mg/dL (ref 0.61–1.24)
GFR calc Af Amer: >60 mL/min (ref >60)
GFR calc non Af Amer: >60 mL/min (ref >60)
Glucose, Bld: 105 mg/dL — ABNORMAL HIGH (ref 70–99)
Potassium: 3.5 mmol/L (ref 3.5–5.1)
Sodium: 139 mmol/L (ref 135–145)
Total Bilirubin: 3.2 mg/dL — ABNORMAL HIGH (ref 0.3–1.2)
Total Protein: 6.2 g/dL — ABNORMAL LOW (ref 6.5–8.1)

## 2020-05-30 LAB — CBC
HCT: 37.4 % — ABNORMAL LOW (ref 39.0–52.0)
Hemoglobin: 12.2 g/dL — ABNORMAL LOW (ref 13.0–17.0)
MCH: 31.7 pg (ref 26.0–34.0)
MCHC: 32.6 g/dL (ref 30.0–36.0)
MCV: 97.1 fL (ref 80.0–100.0)
Platelets: 19 10*3/uL — CL (ref 150–400)
RBC: 3.85 MIL/uL — ABNORMAL LOW (ref 4.22–5.81)
RDW: 19.2 % — ABNORMAL HIGH (ref 11.5–15.5)
WBC: 3.6 10*3/uL — ABNORMAL LOW (ref 4.0–10.5)
nRBC: 0 % (ref 0.0–0.2)

## 2020-05-30 LAB — CULTURE, BLOOD (ROUTINE X 2)
Culture: NO GROWTH
Culture: NO GROWTH
Special Requests: ADEQUATE

## 2020-05-30 MED ORDER — MAGIC MOUTHWASH
5.0000 mL | Freq: Four times a day (QID) | ORAL | Status: DC | PRN
  Filled 2020-05-30: qty 10

## 2020-05-30 MED ORDER — MAGIC MOUTHWASH W/LIDOCAINE: 10.0000 mL | Freq: Four times a day (QID) | ORAL | Status: DC | PRN

## 2020-05-30 MED ORDER — LIDOCAINE VISCOUS HCL 2 % MT SOLN
5.0000 mL | Freq: Four times a day (QID) | OROMUCOSAL | Status: DC | PRN
  Filled 2020-05-30: qty 15

## 2020-05-30 MED ORDER — MORPHINE SULFATE ER 15 MG PO TBCR
15.0000 mg | EXTENDED_RELEASE_TABLET | Freq: Two times a day (BID) | ORAL | Status: DC
  Administered 2020-05-30 – 2020-06-18 (×37): 15 mg via ORAL
  Filled 2020-05-30 (×40): qty 1

## 2020-05-30 NOTE — Care Management Important Message (Signed)
Important Message  Patient Details  Name: John [name] MRN: [id number] Date of Birth: 01-Oct-1955   Medicare Important Message Given:  Yes     Dannette Barbara 05/30/2020, 1:18 PM

## 2020-05-30 NOTE — Progress Notes (Addendum)
PROGRESS NOTE    [name]   NID:[id number]  DOB: 22-Dec-1955  PCP: Cletis Athens, MD    DOA: 05/23/2020 LOS: 5   Brief Narrative   John [name] is a 64 y.o. 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     Patient receives all his oncological care at [facility] and had requested transfer to [facility].  They have no beds available at this time and patient will be admitted to [facility] pending bed availability at [facility].  Evaluation revealed SBP with peritoneal ascitic fluid studies with > 250 PMN's, AKI with Cr 0.70>>1.90, and portal vein thrombus (pt poor candidate for anticoagulation due to thrombocytopenia).     Admitted to hospitalist service with GI and nephrology consulted.  On Rocephin for SBP.    On transfer list for [facility], pending an available bed.      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  Patient is medically stable and no longer in need of acute hospital to hospital transfer.  He has been taken off transfer list.  She will reach out to his outpatient oncology team regarding close follow up for resumption of chemotherapy.  Magic mouthwash ordered for oral pain / mucositis from chemo vs developing thrush   Spontaneous bacterial peritonitis/ Ascites - present on admission with ascitic  fluid > 250 PMN's, worsening diffuse abdominal pain, in the setting of recently placed peritoneal drainage catheter on 05/07/20.   Cultures growing streptococcal mitis/oralis sensitive to PCNs and Rocephin. --s/p albumin earlier in admission --Completed 5 day course Rocephin (9/19 >>9/23) --Has peritoneal catheter in place, drain 3 times weekly Tu/Th/Sat --Per chart review, on prophylactic Cipro as outpatient (organism is resistant)  MixedHCC / Cholangiocarcinoma s/p Y90 radioembolization in 2019 followed by Gem/Cis and capecitabine monotherapy with disease progression currently on Bev/Atezo. Last dose on 03/26/2020.  Next chemo was planned for 9/22 @ [facility], will need to hold until infection resolved. --Oncology consulted --On wait-list now several days for transfer to [place] per patient/family request to see his "usual doctors" Does not need continued acute care at this time, will call to cancel transfer. --Palliative care consulted - patient wishes to continue full scope of care and remain full code. --pain control: started lowest dose MS Contin today.  Continue low dose dilaudid IV for breakthrough --hold pain meds if overly sedated   Acute kidney injury - present on admission.  Resolved with albumin and holding home diuretics and treatment of SBP. --Resumed on home Lasix & spironolactone --monitor BMP --avoid nephrotoxins and hypotension, renally dose meds as indicated  Generalized weakness - Due to advanced malignancy and above acute illness.   PT and OT recommending SNF.    Hyponatremia - Resolved.  POA, mild, stable, 2/2 cirrhosis.  Monitor BMP.    Cirrhosis Hepatic encephalopathy - continues to be somnolent, minimally interactive.   Has been resumed on home lactulose, but  occasionally refuses per nursing staff.  Monitor mental status. Goal at least 2-3 loose stools daily.    Portal vein thrombus - present on admission, seen on CT abdomen/pelvis.   Patient is a noted candidate  for anticoagulation due to severe thrombocytopenia.  Thrombocytopenia - present on admission, stable.  Plt 19,000 with no evidence of bleeding.  Secondary to malignancy and chemotherapy.  Oncology following.  Monitor CBC and for signs of bleeding.   SCDs for VTE prophylaxis.  Hypoxia - requiring 2 L/min supplemental oxygen, not on home oxygen.   Patient is asymptomatic from respiratory standpoint, CXR clear of infiltrates and lungs clear on exam.   --continue supplemental o2, maintain sat >90%, wean as tolerated   Abdominal wall ulcer - located RLQ, was treated for abdominal cellulitis recently at [facility].  Appears this may be a chronic wound at site of peritoneal catheter.  WOC consulted.  Monitor.   History of seizure disorder - appears pt not on antiepileptic medication.  Seizure precautions.  PRN Ativan if seizure activity.  Anxiety disorder - continue PRN Xanax  Chronic pain - continue PRN Dilaudid    DVT prophylaxis: Place and maintain sequential compression device Start: 05/26/20 0823 SCDs Start: 06/03/2020 0935   Diet:  Diet Orders (From admission, onward)    Start     Ordered   05/19/2020 0936  Diet 2 gram sodium Room service appropriate? Yes; Fluid consistency: Thin  Diet effective now       Question Answer Comment  Room service appropriate? Yes   Fluid consistency: Thin      05/22/2020 0936            Code Status: Full Code    Subjective 05/30/20    Patient was sitting up awake with eyes open when I entered the room this AM.  Once I started asking questions, he rolled his eyes back as if falling asleep and then would not answer any more questions.  He complains of persistent abdominal pain.  No fevers or chills.   Disposition Plan & Communication   Status is: Inpatient  Remains inpatient appropriate because:IV treatments appropriate due to intensity of illness or inability to take PO.  Needs SNF.  Unsafe discharge, lives alone and very weak.   Dispo: The  patient is from: Home              Anticipated d/c is to: SNF              Anticipated d/c date is: 1 day              Patient currently is not medically stable to d/c.    Family Communication: none at bedside, will attempt to call.    Consults, Procedures, Significant Events   Consultants:   Oncology  Palliative Care  Gastroenterology  Procedures:   None  Antimicrobials:  Anti-infectives (From admission, onward)   Start     Dose/Rate Route Frequency Ordered Stop   05/29/20 1300  cefTRIAXone (ROCEPHIN) 2 g in sodium chloride 0.9 % 100 mL IVPB        2 g 200 mL/hr over 30 Minutes Intravenous  Once 05/28/20 1354 05/29/20 1323   05/27/20 1600  rifaximin (XIFAXAN) tablet 550 mg        550 mg Oral 2 times daily 05/27/20 1506     05/27/20 0900  cefTRIAXone (ROCEPHIN) 2 g in sodium chloride 0.9 % 100 mL IVPB  Status:  Discontinued  2 g 200 mL/hr over 30 Minutes Intravenous Every 24 hours 05/26/20 1149 05/28/20 1302   05/21/2020 1800  cefTRIAXone (ROCEPHIN) 2 g in sodium chloride 0.9 % 100 mL IVPB  Status:  Discontinued        2 g 200 mL/hr over 30 Minutes Intravenous Every 12 hours 05/30/2020 0936 05/26/20 1149   05/11/2020 0615  cefTRIAXone (ROCEPHIN) 2 g in sodium chloride 0.9 % 100 mL IVPB        2 g 200 mL/hr over 30 Minutes Intravenous  Once 05/31/2020 0604 05/13/2020 0736        Objective   Vitals:   05/29/20 1539 05/29/20 1934 05/30/20 0422 05/30/20 0726  BP: 124/71 (!) 108/59 (!) 141/81 119/70  Pulse: (!) 102 94 99 88  Resp: 20  20 19   Temp: 99.1 F (37.3 C) 98.2 F (36.8 C) 98 F (36.7 C) 97.6 F (36.4 C)  TempSrc: Oral Oral Oral Oral  SpO2: 95% 96% 95% 97%  Weight:   71.5 kg   Height:        Intake/Output Summary (Last 24 hours) at 05/30/2020 0806 Last data filed at 05/30/2020 0427 Gross per 24 hour  Intake 726 ml  Output 2327 ml  Net -1601 ml   Filed Weights   05/27/20 0417 05/29/20 0507 05/30/20 0422  Weight: 79.2 kg 74.3 kg 71.5 kg     Physical Exam:  General exam: awake but drowsy, no acute distress, underweight Respiratory system: CTAB, normal respiratory effort. Cardiovascular system: normal S1/S2, RRR, no pedal edema.   Gastrointestinal system: soft, and tender with guarding on the lightest of palpation today, less distended. Extremities: no cyanosis, normal tone Skin: dry, intact, normal temperature Psychiatry: normal mood, flat affect  Labs   Data Reviewed: I have personally reviewed following labs and imaging studies  CBC: Recent Labs  Lab 05/10/2020 0402 05/07/2020 0402 05/26/20 6553 05/27/20 0544 05/28/20 0627 05/29/20 0441 05/30/20 0621  WBC 4.6   < > 5.9 5.2 3.2* 3.5* 3.6*  NEUTROABS 3.3  --   --   --   --   --   --   HGB 14.0   < > 11.1* 11.3* 11.5* 12.7* 12.2*  HCT 40.8   < > 33.5* 34.4* 34.2* 37.9* 37.4*  MCV 91.9   < > 95.2 97.2 96.6 93.3 97.1  PLT 19*   < > 19* 20* 25* 27* 19*   < > = values in this interval not displayed.   Basic Metabolic Panel: Recent Labs  Lab 05/26/20 0632 05/27/20 0544 05/28/20 0627 05/29/20 0441 05/30/20 0621  NA 130* 134* 134* 136 139  K 4.5 4.9 4.2 4.3 3.5  CL 93* 94* 96* 96* 100  CO2 27 29 27 29 29   GLUCOSE 115* 107* 102* 105* 105*  BUN 39* 39* 32* 32* 26*  CREATININE 1.20 0.91 0.63 0.74 0.68  CALCIUM 8.5* 9.2 9.0 8.9 8.6*   GFR: Estimated Creatinine Clearance: 95.6 mL/min (by C-G formula based on SCr of 0.68 mg/dL). Liver Function Tests: Recent Labs  Lab 06/04/2020 0402 05/27/20 0544 05/28/20 0627 05/29/20 0441 05/30/20 0621  AST 80* 61* 60* 71* 71*  ALT 20 14 16 19 21   ALKPHOS 119 74 75 87 89  BILITOT 3.1* 4.8* 4.3* 3.6* 3.2*  PROT 6.4* 6.5 6.1* 6.2* 6.2*  ALBUMIN 2.7* 4.5 4.0 4.1 3.6   Recent Labs  Lab 05/24/2020 0402  LIPASE 44   Recent Labs  Lab 05/17/2020 0413  AMMONIA 25   Coagulation Profile:  No results for input(s): INR, PROTIME in the last 168 hours. Cardiac Enzymes: No results for input(s): CKTOTAL, CKMB, CKMBINDEX,  TROPONINI in the last 168 hours. BNP (last 3 results) No results for input(s): PROBNP in the last 8760 hours. HbA1C: No results for input(s): HGBA1C in the last 72 hours. CBG: No results for input(s): GLUCAP in the last 168 hours. Lipid Profile: No results for input(s): CHOL, HDL, LDLCALC, TRIG, CHOLHDL, LDLDIRECT in the last 72 hours. Thyroid Function Tests: No results for input(s): TSH, T4TOTAL, FREET4, T3FREE, THYROIDAB in the last 72 hours. Anemia Panel: No results for input(s): VITAMINB12, FOLATE, FERRITIN, TIBC, IRON, RETICCTPCT in the last 72 hours. Sepsis Labs: Recent Labs  Lab 05/24/2020 0413 05/10/2020 0831  LATICACIDVEN 1.9 1.8    Recent Results (from the past 240 hour(s))  Peritoneal Fluid culture ( includes gram stain)     Status: Abnormal   Collection Time: 05/19/2020  4:09 AM   Specimen: Peritoneal Washings; Peritoneal Fluid  Result Value Ref Range Status   Specimen Description   Final    PERITONEAL Performed at [facility], [address] [place], [place] [address]    Special Requests   Final    Immunocompromised Performed at [facility], [address] [place], [place] [address]    Gram Stain   Final    RARE WBC PRESENT, PREDOMINANTLY MONONUCLEAR NO ORGANISMS SEEN Performed at [facility], [place] [address], [place], [place] [address]    Culture STREPTOCOCCUS MITIS/ORALIS (A)  Final   Report Status 05/27/2020 FINAL  Final   Organism ID, Bacteria STREPTOCOCCUS MITIS/ORALIS  Final      Susceptibility   Streptococcus mitis/oralis - MIC*    PENICILLIN <=0.06 SENSITIVE Sensitive     CEFTRIAXONE <=0.12 SENSITIVE Sensitive     ERYTHROMYCIN 2 RESISTANT Resistant     LEVOFLOXACIN >=16 RESISTANT Resistant     VANCOMYCIN 0.25 SENSITIVE Sensitive     * STREPTOCOCCUS MITIS/ORALIS  Blood culture (routine x 2)     Status: None (Preliminary result)   Collection Time: 05/21/2020  4:14 AM   Specimen: BLOOD  Result Value Ref Range Status    Specimen Description BLOOD RIGHT ANTECUBITAL  Final   Special Requests   Final    BOTTLES DRAWN AEROBIC AND ANAEROBIC Blood Culture adequate volume   Culture   Final    NO GROWTH 4 DAYS Performed at [facility], [address], [place], [place] [address]    Report Status PENDING  Incomplete  Blood culture (routine x 2)     Status: None (Preliminary result)   Collection Time: 05/29/2020  4:14 AM   Specimen: BLOOD  Result Value Ref Range Status   Specimen Description BLOOD BLOOD RIGHT HAND  Final   Special Requests   Final    BOTTLES DRAWN AEROBIC AND ANAEROBIC Blood Culture results may not be optimal due to an excessive volume of blood received in culture bottles   Culture   Final    NO GROWTH 4 DAYS Performed at [facility], [address], [place], [place] [address]    Report Status PENDING  Incomplete  SARS Coronavirus 2 by RT PCR (hospital order, performed in [place] hospital lab) Nasopharyngeal Nasopharyngeal Swab     Status: None   Collection Time: 06/05/2020  8:31 AM   Specimen: Nasopharyngeal Swab  Result Value Ref Range Status   SARS Coronavirus 2 NEGATIVE NEGATIVE Final    Comment: (NOTE) SARS-CoV-2 target nucleic acids are NOT  DETECTED.  The SARS-CoV-2 RNA is generally detectable in upper and lower respiratory specimens during the acute phase of infection. The lowest concentration of SARS-CoV-2 viral copies this assay can detect is 250 copies / mL. A negative result does not preclude SARS-CoV-2 infection and should not be used as the sole basis for treatment or other patient management decisions.  A negative result may occur with improper specimen collection / handling, submission of specimen other than nasopharyngeal swab, presence of viral mutation(s) within the areas targeted by this assay, and inadequate number of viral copies (<250 copies / mL). A negative result must be combined with clinical observations, patient history, and  epidemiological information.  Fact Sheet for Patients:   StrictlyIdeas.no  Fact Sheet for Healthcare Providers: BankingDealers.co.za  This test is not yet approved or  cleared by the [place] FDA and has been authorized for detection and/or diagnosis of SARS-CoV-2 by FDA under an Emergency Use Authorization (EUA).  This EUA will remain in effect (meaning this test can be used) for the duration of the COVID-19 declaration under Section 564(b)(1) of the Act, 21 U.S.C. section 360bbb-3(b)(1), unless the authorization is terminated or revoked sooner.  Performed at [facility], [address], [place], [place] [address]       Imaging Studies   No results found.   Medications   Scheduled Meds: . baclofen  10 mg Oral TID  . collagenase   Topical Daily  . folic acid  1 mg Oral Daily  . furosemide  40 mg Oral Daily  . lactulose  20 g Oral BID  . loratadine  10 mg Oral Daily  . magnesium oxide  400 mg Oral Daily  . morphine  15 mg Oral Q12H  . pantoprazole  40 mg Oral Daily  . polyethylene glycol  17 g Oral Once  . rifaximin  550 mg Oral BID  . sodium chloride flush  3 mL Intravenous Q12H  . spironolactone  25 mg Oral Daily   Continuous Infusions: . sodium chloride    . sodium chloride Stopped (05/14/2020 2338)       LOS: 5 days    Time spent: 30 minutes with > 50% spent in coordination of care and direct patient contact.    John Slocumb, DO [facility]  05/30/2020, 8:06 AM    If 7PM-7AM, please contact night-coverage. How to contact the [facility] Attending or Consulting provider [place] or covering provider during after hours [place], for this patient?    1. Check the care team in [facility] and look for a) attending/consulting TRH provider listed and b) the [facility] team listed 2. Log into [email or web address] and use [place]'s universal password to access. If you do not have the password, please contact the  hospital operator. 3. Locate the [facility] provider you are looking for under [facility] and page to a number that you can be directly reached. 4. If you still have difficulty reaching the provider, please page the [facility] (Director on Call) for the Hospitalists listed on amion for assistance.

## 2020-05-30 NOTE — Evaluation (Signed)
Physical Therapy Evaluation Patient Details Name: John [name] MRN: [id number] DOB: 02/12/1956 Today's Date: 05/30/2020   History of Present Illness  64 y.o. 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 Pt reporting abdominal pain however does not quantify pain level despite requesting. Pt deferred OOB attempts secondary to feeling "wiped out" however agreeable to perform bed mobility. Pt able to roll to L and R with heavy usage of bed rails however required no external physical assistance. Pt also needed to slide up in bed and performed supine to long sitting with bilateral UE usage on bed rails. Pt presents with generalized weakness and poor functional activity tolerance. Anticipate pt will require assistance for OOB/transfers/ambulation. Recommend skilled PT to address current deficits. Pt would benefit from STR at discharge from acute stay to optimize return to PLOF and maximize functional mobility, functional activity tolerance, and safety/independence with all mobility.     Follow Up Recommendations SNF;Supervision - Intermittent    Equipment Recommendations  None recommended by PT;Other (comment) (pt reports owning RW and SPC)    Recommendations for Other Services       Precautions / Restrictions Precautions Precautions: Fall Restrictions Weight Bearing Restrictions: No      Mobility  Bed Mobility Overal bed mobility: Needs Assistance Bed Mobility: Rolling;Supine to Sit;Sit to Supine Rolling: Supervision   Supine to sit: Supervision;HOB elevated Sit to  supine: Supervision;HOB elevated   General bed mobility comments: heavy use of bedrails to roll side to side and to come into long sitting position in bed; requires increased time to perform task   Transfers Overall transfer level: Needs assistance               General transfer comment: pt reports fatigue and defers OOB attempts; anticipate pt will need +1 assistance secondary to strength and anticipated balance deficits  Ambulation/Gait             General Gait Details: deferred secondary to pt level of fatigue  Stairs            Wheelchair Mobility    Modified Rankin (Stroke Patients Only)       Balance Overall balance assessment: Needs assistance Sitting-balance support: Bilateral upper extremity supported Sitting balance-Leahy Scale: Fair Sitting balance - Comments: fair balance while pt long sitting in bed with consistent heavy UE usage on bedrails to maintain position       Standing balance comment: not attempted                             Pertinent Vitals/Pain Pain Assessment: Faces Faces Pain Scale: Hurts little more Pain Location: abdomen Pain Descriptors / Indicators: Aching;Grimacing;Guarding Pain Intervention(s): Monitored during session    Home Living Family/patient expects to be discharged to:: Private residence Living Arrangements: Alone   Type of Home: Mobile home Home Access: Stairs to enter Entrance Stairs-Rails: None Entrance Stairs-Number of Steps: 3 Home Layout: One level Home Equipment: Walker - 2 wheels;Cane - single point Additional Comments: Pt reports that he does not believe that he has any assistance when he gets to the point of discharging home. Pt  limited in responses secondary to levels of fatigue and reports of feeling "worse than crap".    Prior Function Level of Independence: Independent with assistive device(s)         Comments: Pt reports use of RW and SPC as he sees fit. He also reports  independence with sit <> stand transfers.     Hand Dominance   Dominant Hand: Right    Extremity/Trunk Assessment   Upper Extremity Assessment Upper Extremity Assessment: Generalized weakness    Lower Extremity Assessment Lower Extremity Assessment: Generalized weakness       Communication   Communication: HOH  Cognition Arousal/Alertness: Awake/alert Behavior During Therapy: WFL for tasks assessed/performed Overall Cognitive Status: No family/caregiver present to determine baseline cognitive functioning                                 General Comments: When asked how pt was feeling, he stated "2021". Pt alert to name, DOB, and hospital.      General Comments      Exercises     Assessment/Plan    PT Assessment Patient needs continued PT services  PT Problem List Decreased strength;Decreased activity tolerance;Decreased balance;Decreased mobility;Decreased cognition;Cardiopulmonary status limiting activity;Pain       PT Treatment Interventions DME instruction;Gait training;Stair training;Functional mobility training;Therapeutic activities;Therapeutic exercise;Balance training;Cognitive remediation;Patient/family education    PT Goals (Current goals can be found in the Care Plan section)  Acute Rehab PT Goals Patient Stated Goal: have less abdominal pain PT Goal Formulation: With patient Time For Goal Achievement: 06/13/20 Potential to Achieve Goals: Fair    Frequency Min 2X/week   Barriers to discharge Decreased caregiver support      Co-evaluation               AM-PAC PT "6 Clicks" Mobility  Outcome Measure Help needed turning from your back to your side while in a flat bed without using bedrails?: A Little Help needed moving from lying on your back to sitting on the side of a flat bed without using bedrails?: A Lot Help needed moving to and from a bed to a chair (including a wheelchair)?: A Little Help needed standing up from a chair  using your arms (e.g., wheelchair or bedside chair)?: A Little Help needed to walk in hospital room?: A Little Help needed climbing 3-5 steps with a railing? : A Lot 6 Click Score: 16    End of Session Equipment Utilized During Treatment: Oxygen;Other (comment) (2L O2 via nasal cannula) Activity Tolerance: Patient limited by fatigue Patient left: in bed;with call bell/phone within reach;with bed alarm set Nurse Communication: Mobility status PT Visit Diagnosis: Unsteadiness on feet (R26.81);Other abnormalities of gait and mobility (R26.89);Muscle weakness (generalized) (M62.81);Pain Pain - part of body:  (abdomen)    Time: 7846-9629 PT Time Calculation (min) (ACUTE ONLY): 21 min   Charges:              Vale Haven, SPT  Vale Haven 05/30/2020, 11:41 AM

## 2020-05-30 NOTE — Consult Note (Signed)
Consultation Note Date: 05/30/2020   Patient Name: John [name]  DOB: 1955/12/22  MRN: [id number]  Age / Sex: 64 y.o., male   PCP: Cletis Athens, MD Referring Physician: Ezekiel Slocumb, DO   REASON FOR CONSULTATION:Establishing goals of care  Palliative Care consult requested for goals of care discussion in this 64 y.o. male with multiple medical problems including hepatocellular carcinoma and cholangiocarcinoma (currently receiving treatment @ [facility] Bevacizumab/Atezolizumab) s/p radioembolization (2019) and chemotherapy with continued progression, Pluerx catheter placed (05/2020) due to fastly accumulating ascites, thrombocytopenia, stage 1 NSCLC (2019) s/p SBRT (02/01/18), alcohol abuse, anemia, COPD, dystonia, Hepatitis C, hypertension, alcoholic liver cirrhosis, seizures, tremors, and tobacco use. He presented to the ED with abdominal pain.  During work-up found to have SBP. GI following and determined not a candidate for diuretics due to renal concerns.   Clinical Assessment and Goals of Care: I have reviewed medical records including lab results, imaging, Epic notes, and MAR, received report from the bedside RN, and assessed the patient. I met at the bedside with Mr. John [name] and his friend John [name] to discuss diagnosis prognosis, [place], EOL wishes, disposition and options.  Patient somnolent. Easily aroused. Complains of abdominal discomfort at times but better than before. He provides permission to have goals of care discussion and provide medical information to his friend John [name]. Patient observed intermittently falling asleep during discussions however with verbal stimuli or touch he awakens and will pick back up with discussion where left off.   I introduced Palliative Medicine as specialized medical care for people living with serious illness. It focuses on providing relief from the symptoms and stress of a serious illness. The goal is to improve quality of life for both the patient  and the family. He verbalized understanding.   We discussed a brief life review of the patient, along with his functional and nutritional status. Othar reports he lives in the home alone. He is divorced and never had any children. Reports he was born and raised in the [place] area. 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 He has 2 brothers and a nephew John [name], John [name], and nephew John [name]) who are his main support in addition to his friend John [name]. He has a dog named John [name] and shares his brothers are caring for her while he is in the hospital.   Prior to admission he reports being able to independently care for himself. He would drive and take himself to appointments. He reports his appetite was fair and would generally eat 2 main meals with snacking throughout the day however, if his ascites was bad he would not have much of an appetite and became more fatigued. He reports he receives chemotherapy at [facility] every 2-3 weeks and was supposed to have his treatment on yesterday.   We discussed His current illness and what it means in the larger context of His on-going co-morbidities. With specific discussions regarding cancer, cirrhosis, rapid re accumulation of ascites, and his overall functional state. Natural disease trajectory and expectations at EOL were discussed.  Patient became somewhat withdrawn during this discussion, expressing he understands that he is sick and probably not going to live to many more years but right now all he is focused on is living.   I attempted to elicit values and goals of care important to the patient.    Stacy verbalizes patient was doing fairly well and family continues to remain hopeful for some improvement/stability. Patient reports he is focusing on getting back  to his providers at [facility] to continue receiving necessary treatments under the medical team that is most familiar with his care.   I attempted to discuss concerns with patient's somnolence and poor  prognosis as indicated by rapid ascites accumulation, hepatic encephalopathy, and poor compliance to care. Patient again expresses wishes to return home and to his somewhat "ok" baseline so he can follow up with his [facility] medical team if not going to be able to transfer there.   Advanced directives, concepts specific to code status, artifical feeding and hydration, and rehospitalization were considered and discussed. Patient does not have a documented advanced directive. He does express that his medical decision maker would be his brother John [name].   I discussed patient's full code status at length with patient and his friend, John [name]. He verbalized understanding and expressed wishes to remain a full code with full aggressive interventions. He reports he is not ready to "leave this earth" but does specify he would not want to be in a vegetative state or on prolonged life-sustaining measures. He reports at that time "he can die" but only in those situations and he is relying on his brother to make the best decision.   Patient states he is not interested in hospice discussions and neither is his friend John [name].   Questions and concerns were addressed. The family was encouraged to call with questions or concerns.  PMT will continue to support holistically.  SOCIAL HISTORY:     reports that he quit smoking about 2 years ago. His smoking use included cigarettes. He started smoking about 2 years ago. He has a 17.50 pack-year smoking history. His smokeless tobacco use includes snuff. He reports that he does not drink alcohol and does not use drugs.  CODE STATUS: DNR  ADVANCE DIRECTIVES: Primary Decision Maker: Patient reports his brother John [name] would be his designated Media planner. I have added his name and number to patient's contacts as he provided.    SYMPTOM MANAGEMENT: per attending   Palliative Prophylaxis:   Aspiration, Delirium Protocol, Frequent Pain Assessment, Oral Care,  Palliative Wound Care and Turn Reposition  PSYCHO-SOCIAL/SPIRITUAL:  Support System: Family  Desire for further Chaplaincy support: NO   Additional Recommendations (Limitations, Scope, Preferences):  Full Scope Treatment  Education on hospice/palliative    PAST MEDICAL HISTORY: Past Medical History:  Diagnosis Date  . Abnormal MRI, lumbar spine (2015) 01/21/2016   FINDINGS:  L3-4: Tiny right foraminal and extra foraminal disc bulge adjacent to but not compressing the L3 nerve lateral to the neural foramen. L4-5: There is a small broad-based disc bulge. There is also hypertrophy of the ligamentum flavum and facet joints, left more than right creating moderately severe spinal stenosis and bilateral lateral recess stenosis, left greater than right. This appe  . Anxiety   . Back ache 05/06/2014  . Cervical pain 05/06/2014  . 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[place]) 06/23/2015  . COPD (chronic obstructive pulmonary disease) ([place])   . De Quervain's tenosynovitis, left 07/07/2017  . Depression   . Dystonia   . Epileptic disorder ([place]) 06/23/2015  . Extremity pain 05/06/2014  . Febrile seizures ([place])    child  . Foot pain 09/11/2014  . Gastric ulcer 06/23/2015  . Gout   . H/O neoplasm 06/23/2015  . Head injury 06/23/2015  . Hemorrhoids   . Hepatic cirrhosis ([place]) 06/23/2015  . Hepatitis B   . History of GI bleed   . Leukopenia   . Liver cancer ([place]) 08/2018  . Neurogenic  pain 01/21/2016  . Nodule of upper lobe of left lung 12/02/2017  . Non-small cell cancer of left lung ([place]) 2019   Rad tx's.   . Obstructive sleep apnea 06/23/2015  . 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 Osteoarthritis of spine with radiculopathy, lumbosacral region 06/23/2015  . Peripheral neuropathy 06/23/2015  . Peripheral neuropathy ([place]) 37/90/2409  . Personal history of tobacco use, presenting hazards to health 01/09/2016  . Pulse irregularity   . Seizures ([place])   .  Spinal stenosis   . Splenomegaly 05/30/2013  . Thrombocytopenia ([place]) 06/23/2015  . Uncomplicated opioid dependence ([place]) 06/23/2015    ALLERGIES:  is allergic to codeine, morphine, duloxetine, and oxycodone.   MEDICATIONS:  Current Facility-Administered Medications  Medication Dose Route Frequency Provider Last Rate Last Admin  . 0.9 %  sodium chloride infusion  250 mL Intravenous PRN Agbata, Tochukwu, MD      . 0.9 %  sodium chloride infusion   Intravenous PRN Collier Bullock, MD   Stopped at 05/12/2020 2338  . ALPRAZolam Duanne Moron) tablet 0.25 mg  0.25 mg Oral QHS PRN Agbata, Tochukwu, MD   0.25 mg at 05/29/20 2245  . baclofen (LIORESAL) tablet 10 mg  10 mg Oral TID Collier Bullock, MD   10 mg at 05/30/20 0913  . collagenase (SANTYL) ointment   Topical Daily Gwynne Edinger, MD   1 application at 73/53/29 [phone]  . dextromethorphan-guaiFENesin (MUCINEX DM) 30-600 MG per 12 hr tablet 1 tablet  1 tablet Oral BID PRN Lang Snow, NP   1 tablet at 05/28/20 0530  . folic acid (FOLVITE) tablet 1 mg  1 mg Oral Daily Agbata, Tochukwu, MD   1 mg at 05/30/20 0912  . furosemide (LASIX) tablet 40 mg  40 mg Oral Daily Gwynne Edinger, MD   40 mg at 05/30/20 0912  . HYDROmorphone (DILAUDID) injection 0.5 mg  0.5 mg Intravenous Q4H PRN Nicole Kindred A, DO   0.5 mg at 05/30/20 0449  . lactulose (CHRONULAC) 10 GM/15ML solution 20 g  20 g Oral BID Gwynne Edinger, MD   20 g at 05/30/20 0912  . loratadine (CLARITIN) tablet 10 mg  10 mg Oral Daily Agbata, Tochukwu, MD   10 mg at 05/30/20 0912  . LORazepam (ATIVAN) injection 2 mg  2 mg Intravenous Q4H PRN Agbata, Tochukwu, MD      . magnesium oxide (MAG-OX) tablet 400 mg  400 mg Oral Daily Agbata, Tochukwu, MD   400 mg at 05/30/20 0912  . morphine (MS CONTIN) 12 hr tablet 15 mg  15 mg Oral Q12H Nicole Kindred A, DO   15 mg at 05/30/20 0913  . ondansetron (ZOFRAN) tablet 4 mg  4 mg Oral Q6H PRN Agbata, Tochukwu, MD       Or  . ondansetron  (ZOFRAN) injection 4 mg  4 mg Intravenous Q6H PRN Agbata, Tochukwu, MD   4 mg at 06/03/2020 0954  . pantoprazole (PROTONIX) EC tablet 40 mg  40 mg Oral Daily Agbata, Tochukwu, MD   40 mg at 05/30/20 0913  . rifaximin (XIFAXAN) tablet 550 mg  550 mg Oral BID [name], MD   550 mg at 05/30/20 0913  . sodium chloride flush (NS) 0.9 % injection 3 mL  3 mL Intravenous Q12H Agbata, Tochukwu, MD   3 mL at 05/30/20 0914  . sodium chloride flush (NS) 0.9 % injection 3 mL  3 mL Intravenous PRN Agbata, Tochukwu, MD      .  spironolactone (ALDACTONE) tablet 25 mg  25 mg Oral Daily Gwynne Edinger, MD   25 mg at 05/30/20 0913    VITAL SIGNS: BP 108/68 (BP Location: Right Arm)   Pulse 89   Temp (!) 97.5 F (36.4 C) (Oral)   Resp 17   Ht 6' (1.829 m)   Wt 71.5 kg   SpO2 99%   BMI 21.39 kg/m  Filed Weights   05/27/20 0417 05/29/20 0507 05/30/20 0422  Weight: 79.2 kg 74.3 kg 71.5 kg    Estimated body mass index is 21.39 kg/m as calculated from the following:   Height as of this encounter: 6' (1.829 m).   Weight as of this encounter: 71.5 kg.  LABS: CBC:    Component Value Date/Time   WBC 3.6 (L) 05/30/2020 0621   HGB 12.2 (L) 05/30/2020 0621   HGB 10.3 (L) 11/02/2019 1017   HGB 14.5 06/24/2014 1341   HCT 37.4 (L) 05/30/2020 0621   HCT 42.9 06/24/2014 1341   PLT 19 (LL) 05/30/2020 0621   PLT 77 (L) 11/02/2019 1017   PLT 63 (L) 06/24/2014 1341   Comprehensive Metabolic Panel:    Component Value Date/Time   NA 139 05/30/2020 0621   NA 140 04/18/2012 0505   K 3.5 05/30/2020 0621   K 3.5 04/18/2012 0505   BUN 26 (H) 05/30/2020 0621   BUN 2 (L) 04/18/2012 0505   CREATININE 0.68 05/30/2020 0621   CREATININE 0.70 11/02/2019 1017   CREATININE 0.59 (L) 11/09/2012 1705   ALBUMIN 3.6 05/30/2020 0621   ALBUMIN 3.6 11/09/2012 1705     Review of Systems  Constitutional: Positive for fatigue.  Gastrointestinal: Positive for abdominal distention.  Neurological: Positive for weakness.    Unless otherwise noted, a complete review of systems is negative.  Physical Exam General:somnolent, frail chronically-ill appearing Cardiovascular: regular rate and rhythm Pulmonary: , diminished bilaterally  Abdomen: firm, tender, + bowel sounds, +ascites, right Pleurx cath  Neurological: somnolent, easily aroused, alert and oriented when awake, follows commands, somewhat withdrawn   Prognosis: Guarded-Poor   Discharge Planning:  To Be Determined  Recommendations: . Full Code-as confirmed by patient  . Continue with current plan of care per medical team, full scope/full aggressive  . Patient remains hopeful for stability. States he only wishes to focus on continuing treatment and receiving any and all care that can be provided. Becomes withdrawn when discussing code status and prognosis. Declines hospice/palliative support. Clear in expressed goals to treat the treatable with hopes of returning to [facility] to receive care by known providers.   . Complains of some mouth soreness and irritation. No thrush noted, however some redness. Last chemo 05/07/20 may be developing mucositis or oral irritation. Would recommend Magic Mouthwash for support.  . Patient would benefit from outpatient palliative support to continue ongoing discussions in the setting of poor prognosis, however, not open at this time.  Marland Kitchen PMT will continue to support and follow. Please call team line with urgent needs.   Palliative Performance Scale: PPS 20-30%                Patient and friend, John [name] expressed understanding and was in agreement with this plan.   Thank you for allowing the Palliative Medicine Team to assist in the care of this patient.  Time In: 1310 Time Out: 1405 Time Total: 55 min.   Visit consisted of counseling and education dealing with the complex and emotionally intense issues of symptom management and palliative care in  the setting of serious and potentially life-threatening illness.Greater than  50%  of this time was spent counseling and coordinating care related to the above assessment and plan.  Signed by:  Alda Lea, AGPCNP-BC Palliative Medicine Team  Phone: [phone] Pager: [phone] Amion: Bjorn Pippin

## 2020-05-31 LAB — COMPREHENSIVE METABOLIC PANEL
ALT: 21 U/L (ref 0–44)
AST: 70 U/L — ABNORMAL HIGH (ref 15–41)
Albumin: 3.5 g/dL (ref 3.5–5.0)
Alkaline Phosphatase: 87 U/L (ref 38–126)
Anion gap: 10 (ref 5–15)
BUN: 33 mg/dL — ABNORMAL HIGH (ref 8–23)
CO2: 28 mmol/L (ref 22–32)
Calcium: 8.5 mg/dL — ABNORMAL LOW (ref 8.9–10.3)
Chloride: 101 mmol/L (ref 98–111)
Creatinine, Ser: 0.84 mg/dL (ref 0.61–1.24)
GFR calc Af Amer: >60 mL/min (ref >60)
GFR calc non Af Amer: >60 mL/min (ref >60)
Glucose, Bld: 108 mg/dL — ABNORMAL HIGH (ref 70–99)
Potassium: 4.3 mmol/L (ref 3.5–5.1)
Sodium: 139 mmol/L (ref 135–145)
Total Bilirubin: 3.3 mg/dL — ABNORMAL HIGH (ref 0.3–1.2)
Total Protein: 5.9 g/dL — ABNORMAL LOW (ref 6.5–8.1)

## 2020-05-31 LAB — CBC
HCT: 38.1 % — ABNORMAL LOW (ref 39.0–52.0)
Hemoglobin: 12.4 g/dL — ABNORMAL LOW (ref 13.0–17.0)
MCH: 31.2 pg (ref 26.0–34.0)
MCHC: 32.5 g/dL (ref 30.0–36.0)
MCV: 96 fL (ref 80.0–100.0)
Platelets: 16 10*3/uL — CL (ref 150–400)
RBC: 3.97 MIL/uL — ABNORMAL LOW (ref 4.22–5.81)
RDW: 19.3 % — ABNORMAL HIGH (ref 11.5–15.5)
WBC: 4.6 10*3/uL (ref 4.0–10.5)
nRBC: 0 % (ref 0.0–0.2)

## 2020-05-31 NOTE — Progress Notes (Signed)
PROGRESS NOTE    John [name]   ZOX:[id number]  DOB: [birth year]  PCP: John Athens, MD    DOA: 05/08/2020 LOS: 6   Brief Narrative   John [name] is a 64 y.o. 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     Patient receives all his oncological care at [facility] and had requested transfer to [facility].  They have no beds available at this time and patient will be admitted to [facility] pending bed availability at [facility].  Evaluation revealed SBP with peritoneal ascitic fluid studies with > 250 PMN's, AKI with Cr 0.70>>1.90, and portal vein thrombus (pt poor candidate for anticoagulation due to thrombocytopenia).     Admitted to hospitalist service with GI and nephrology consulted.  On Rocephin for SBP.    On transfer list for [facility], pending an available bed.      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   Cultures growing streptococcal mitis/oralis sensitive to PCNs and Rocephin. Completed 5 day course Rocephin (9/19 >>9/23) --s/p albumin earlier in admission --Per chart review, on prophylactic Cipro as outpatient (organism is  resistant)   Latimer / Cholangiocarcinoma / Ascites s/p Y90 radioembolization in 2019 followed by Gem/Cis and capecitabine monotherapy with disease progression currently on Bev/Atezo. Last dose on 03/26/2020.  Next chemo was planned for 9/22 @ [facility], will need to hold until infection resolved. --Oncology consulted --9/24 PM spoke to oncology attending at [facility].  Patient is medically stable and no longer in need of acute hospital to hospital transfer.  He has been taken off transfer list.  She will reach out to his outpatient oncology team regarding close follow up for resumption of chemotherapy. --Palliative care consulted - patient wishes to continue full scope of care and remain full code. --pain control: started lowest dose MS Contin 9/24.  Continue low dose dilaudid IV for breakthrough. --hold pain meds if overly sedated --Has peritoneal catheter in place, drain 3 times weekly Tu/Th/Sat   Acute kidney injury - present on admission.  Resolved with albumin and holding home diuretics and treatment of SBP. --Resumed on home Lasix & spironolactone --monitor BMP --avoid nephrotoxins and hypotension, renally dose meds as indicated  Generalized weakness - Due to advanced malignancy and above acute illness.   PT and OT recommending SNF.    Hyponatremia - Resolved.  POA, mild, stable, 2/2 cirrhosis.  Monitor BMP.    Cirrhosis Hepatic encephalopathy - continues to be somnolent, minimally interactive.   Has been resumed on home lactulose, but occasionally refuses per nursing staff.  Monitor mental status. Goal at least 2-3 loose stools daily.    Portal vein thrombus - present on admission, seen on CT abdomen/pelvis.   Patient is a noted candidate for anticoagulation due to severe thrombocytopenia.  Thrombocytopenia -  present on admission, stable.  Plt 19,000 with no evidence of bleeding.  Secondary to malignancy and chemotherapy.  Oncology following.  Monitor CBC and for signs of bleeding.    SCDs for VTE prophylaxis.  Hypoxia - requiring 2 L/min supplemental oxygen, not on home oxygen.   Patient is asymptomatic from respiratory standpoint, CXR clear of infiltrates and lungs clear on exam.   --continue supplemental o2, maintain sat >90%, wean as tolerated   Abdominal wall ulcer - located RLQ, was treated for abdominal cellulitis recently at [facility].  Appears this may be a chronic wound at site of peritoneal catheter.  WOC consulted.  Monitor.   History of seizure disorder - appears pt not on antiepileptic medication.  Seizure precautions.  PRN Ativan if seizure activity.  Anxiety disorder - continue PRN Xanax  Chronic pain - continue PRN Dilaudid    DVT prophylaxis: Place and maintain sequential compression device Start: 05/26/20 0823 SCDs Start: 05/12/2020 0935   Diet:  Diet Orders (From admission, onward)    Start     Ordered   06/03/2020 0936  Diet 2 gram sodium Room service appropriate? Yes; Fluid consistency: Thin  Diet effective now       Question Answer Comment  Room service appropriate? Yes   Fluid consistency: Thin      05/16/2020 0936            Code Status: Full Code    Subjective 05/31/20    Patient was sitting up with breakfast tray in front of him partially eaten.  Again he was awake when I entered the room and appeared to drift off to sleep when I would talk to him, would not verbally answer any questions other than nodding his head that pain is a little better with long-acting morphine started yesterday.  I told him about my conversation with [facility] yesterday and transfer being canceled, he did not respond.   Is not clear if patient is actually sedated or does not wish to have a conversation.    Disposition Plan & Communication   Status is: Inpatient  Remains inpatient appropriate because:IV treatments appropriate due to intensity of illness or inability to take PO.  Needs SNF.  Unsafe discharge, lives alone and very weak.   Dispo: The patient is  from: Home              Anticipated d/c is to: SNF              Anticipated d/c date is: 1 day -pending bed              Patient currently is not medically stable to d/c.    Family Communication: none at bedside, will attempt to call.    Consults, Procedures, Significant Events   Consultants:   Oncology  Palliative Care  Gastroenterology  Procedures:   None  Antimicrobials:  Anti-infectives (From admission, onward)   Start     Dose/Rate Route Frequency Ordered Stop   05/29/20 1300  cefTRIAXone (ROCEPHIN) 2 g in sodium chloride 0.9 % 100 mL IVPB        2 g 200 mL/hr over 30 Minutes Intravenous  Once 05/28/20 1354 05/29/20 1323   05/27/20 1600  rifaximin (XIFAXAN) tablet 550 mg        550 mg Oral 2 times daily 05/27/20 1506     05/27/20 0900  cefTRIAXone (ROCEPHIN) 2 g in sodium chloride 0.9 % 100 mL IVPB  Status:  Discontinued  2 g 200 mL/hr over 30 Minutes Intravenous Every 24 hours 05/26/20 1149 05/28/20 1302   05/13/2020 1800  cefTRIAXone (ROCEPHIN) 2 g in sodium chloride 0.9 % 100 mL IVPB  Status:  Discontinued        2 g 200 mL/hr over 30 Minutes Intravenous Every 12 hours 05/29/2020 0936 05/26/20 1149   05/09/2020 0615  cefTRIAXone (ROCEPHIN) 2 g in sodium chloride 0.9 % 100 mL IVPB        2 g 200 mL/hr over 30 Minutes Intravenous  Once 05/24/2020 0604 05/19/2020 0736        Objective   Vitals:   05/30/20 1944 05/31/20 0332 05/31/20 0842 05/31/20 1210  BP: 115/72 111/61 114/83 120/83  Pulse: 94 88 91 92  Resp: 20 19 18 10   Temp: 97.7 F (36.5 C) 98.6 F (37 C) (!) 97.4 F (36.3 C) 99.2 F (37.3 C)  TempSrc: Oral Oral Oral Oral  SpO2: 92% 91% 91% 93%  Weight:  71.5 kg    Height:        Intake/Output Summary (Last 24 hours) at 05/31/2020 1616 Last data filed at 05/31/2020 0842 Gross per 24 hour  Intake 0 ml  Output 100 ml  Net -100 ml   Filed Weights   05/29/20 0507 05/30/20 0422 05/31/20 0332  Weight: 74.3 kg 71.5 kg 71.5 kg    Physical  Exam:  General exam: awake but appears falling asleep, no acute distress, cachectic, mostly nonverbal Respiratory system: CTAB, normal respiratory effort. Cardiovascular system: normal S1/S2, RRR, no pedal edema.   Gastrointestinal system: soft but slightly more distended, less tender on palpation in today is not guarding. Skin: dry, intact, normal temperature, jaundice   Labs   Data Reviewed: I have personally reviewed following labs and imaging studies  CBC: Recent Labs  Lab 05/07/2020 0402 05/26/20 7681 05/27/20 0544 05/28/20 0627 05/29/20 0441 05/30/20 0621 05/31/20 0529  WBC 4.6   < > 5.2 3.2* 3.5* 3.6* 4.6  NEUTROABS 3.3  --   --   --   --   --   --   HGB 14.0   < > 11.3* 11.5* 12.7* 12.2* 12.4*  HCT 40.8   < > 34.4* 34.2* 37.9* 37.4* 38.1*  MCV 91.9   < > 97.2 96.6 93.3 97.1 96.0  PLT 19*   < > 20* 25* 27* 19* 16*   < > = values in this interval not displayed.   Basic Metabolic Panel: Recent Labs  Lab 05/27/20 0544 05/28/20 0627 05/29/20 0441 05/30/20 0621 05/31/20 0529  NA 134* 134* 136 139 139  K 4.9 4.2 4.3 3.5 4.3  CL 94* 96* 96* 100 101  CO2 29 27 29 29 28   GLUCOSE 107* 102* 105* 105* 108*  BUN 39* 32* 32* 26* 33*  CREATININE 0.91 0.63 0.74 0.68 0.84  CALCIUM 9.2 9.0 8.9 8.6* 8.5*   GFR: Estimated Creatinine Clearance: 91 mL/min (by C-G formula based on SCr of 0.84 mg/dL). Liver Function Tests: Recent Labs  Lab 05/27/20 0544 05/28/20 0627 05/29/20 0441 05/30/20 0621 05/31/20 0529  AST 61* 60* 71* 71* 70*  ALT 14 16 19 21 21   ALKPHOS 74 75 87 89 87  BILITOT 4.8* 4.3* 3.6* 3.2* 3.3*  PROT 6.5 6.1* 6.2* 6.2* 5.9*  ALBUMIN 4.5 4.0 4.1 3.6 3.5   Recent Labs  Lab 06/01/2020 0402  LIPASE 44   Recent Labs  Lab 05/10/2020 0413  AMMONIA 25   Coagulation Profile: No results for input(s): INR,  PROTIME in the last 168 hours. Cardiac Enzymes: No results for input(s): CKTOTAL, CKMB, CKMBINDEX, TROPONINI in the last 168 hours. BNP (last 3  results) No results for input(s): PROBNP in the last 8760 hours. HbA1C: No results for input(s): HGBA1C in the last 72 hours. CBG: No results for input(s): GLUCAP in the last 168 hours. Lipid Profile: No results for input(s): CHOL, HDL, LDLCALC, TRIG, CHOLHDL, LDLDIRECT in the last 72 hours. Thyroid Function Tests: No results for input(s): TSH, T4TOTAL, FREET4, T3FREE, THYROIDAB in the last 72 hours. Anemia Panel: No results for input(s): VITAMINB12, FOLATE, FERRITIN, TIBC, IRON, RETICCTPCT in the last 72 hours. Sepsis Labs: Recent Labs  Lab 05/27/2020 0413 05/31/2020 0831  LATICACIDVEN 1.9 1.8    Recent Results (from the past 240 hour(s))  Peritoneal Fluid culture ( includes gram stain)     Status: Abnormal   Collection Time: 06/04/2020  4:09 AM   Specimen: Peritoneal Washings; Peritoneal Fluid  Result Value Ref Range Status   Specimen Description   Final    PERITONEAL Performed at [facility], [address], [place], [place] [address]    Special Requests   Final    Immunocompromised Performed at [facility], [address] [place], [place] [address]    Gram Stain   Final    RARE WBC PRESENT, PREDOMINANTLY MONONUCLEAR NO ORGANISMS SEEN Performed at [facility], [place] [address], [place], [place] [address]    Culture STREPTOCOCCUS MITIS/ORALIS (A)  Final   Report Status 05/27/2020 FINAL  Final   Organism ID, Bacteria STREPTOCOCCUS MITIS/ORALIS  Final      Susceptibility   Streptococcus mitis/oralis - MIC*    PENICILLIN <=0.06 SENSITIVE Sensitive     CEFTRIAXONE <=0.12 SENSITIVE Sensitive     ERYTHROMYCIN 2 RESISTANT Resistant     LEVOFLOXACIN >=16 RESISTANT Resistant     VANCOMYCIN 0.25 SENSITIVE Sensitive     * STREPTOCOCCUS MITIS/ORALIS  Blood culture (routine x 2)     Status: None   Collection Time: 05/24/2020  4:14 AM   Specimen: BLOOD  Result Value Ref Range Status   Specimen Description BLOOD RIGHT ANTECUBITAL  Final   Special Requests    Final    BOTTLES DRAWN AEROBIC AND ANAEROBIC Blood Culture adequate volume   Culture   Final    NO GROWTH 5 DAYS Performed at [facility], [address] [place], [place] [address]    Report Status 05/30/2020 FINAL  Final  Blood culture (routine x 2)     Status: None   Collection Time: 05/10/2020  4:14 AM   Specimen: BLOOD  Result Value Ref Range Status   Specimen Description BLOOD BLOOD RIGHT HAND  Final   Special Requests   Final    BOTTLES DRAWN AEROBIC AND ANAEROBIC Blood Culture results may not be optimal due to an excessive volume of blood received in culture bottles   Culture   Final    NO GROWTH 5 DAYS Performed at [facility], [address], [place], [place] [address]    Report Status 05/30/2020 FINAL  Final  SARS Coronavirus 2 by RT PCR (hospital order, performed in [facility] hospital lab) Nasopharyngeal Nasopharyngeal Swab     Status: None   Collection Time: 05/11/2020  8:31 AM   Specimen: Nasopharyngeal Swab  Result Value Ref Range Status   SARS Coronavirus 2 NEGATIVE NEGATIVE Final    Comment: (NOTE) SARS-CoV-2 target nucleic acids are NOT DETECTED.  The SARS-CoV-2 RNA is generally  detectable in upper and lower respiratory specimens during the acute phase of infection. The lowest concentration of SARS-CoV-2 viral copies this assay can detect is 250 copies / mL. A negative result does not preclude SARS-CoV-2 infection and should not be used as the sole basis for treatment or other patient management decisions.  A negative result may occur with improper specimen collection / handling, submission of specimen other than nasopharyngeal swab, presence of viral mutation(s) within the areas targeted by this assay, and inadequate number of viral copies (<250 copies / mL). A negative result must be combined with clinical observations, patient history, and epidemiological information.  Fact Sheet for Patients:    StrictlyIdeas.no  Fact Sheet for Healthcare Providers: BankingDealers.co.za  This test is not yet approved or  cleared by the [place] FDA and has been authorized for detection and/or diagnosis of SARS-CoV-2 by FDA under an Emergency Use Authorization (EUA).  This EUA will remain in effect (meaning this test can be used) for the duration of the COVID-19 declaration under Section 564(b)(1) of the Act, 21 U.S.C. section 360bbb-3(b)(1), unless the authorization is terminated or revoked sooner.  Performed at [facility], [address] [place], [place] [address]       Imaging Studies   No results found.   Medications   Scheduled Meds: . baclofen  10 mg Oral TID  . collagenase   Topical Daily  . folic acid  1 mg Oral Daily  . furosemide  40 mg Oral Daily  . lactulose  20 g Oral BID  . loratadine  10 mg Oral Daily  . magnesium oxide  400 mg Oral Daily  . morphine  15 mg Oral Q12H  . pantoprazole  40 mg Oral Daily  . rifaximin  550 mg Oral BID  . sodium chloride flush  3 mL Intravenous Q12H  . spironolactone  25 mg Oral Daily   Continuous Infusions: . sodium chloride    . sodium chloride Stopped (05/24/2020 2338)       LOS: 6 days    Time spent: 20 minutes    Ezekiel Slocumb, DO [facility]  05/31/2020, 4:16 PM    If 7PM-7AM, please contact night-coverage. How to contact the [facility] Attending or Consulting provider [place] or covering provider during after hours [place], for this patient?    1. Check the care team in [facility] and look for a) attending/consulting TR[facility] provider listed and b) the [facility] team listed 2. Log into [email or web address] and use [place]'s universal password to access. If you do not have the password, please contact the hospital operator. 3. Locate the [facility] provider you are looking for under [facility] and page to a number that you can be directly reached. 4. If you still have  difficulty reaching the provider, please page the [facility] (Director on Call) for the Hospitalists listed on amion for assistance.

## 2020-06-01 NOTE — Progress Notes (Signed)
PROGRESS NOTE    John [name]   GYJ:[id number]  DOB: 1956/04/09  PCP: Cletis Athens, MD    DOA: 05/18/2020 LOS: 7   Brief Narrative   John [name] is a 64 y.o. 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     Patient receives all his oncological care at [facility] and had requested transfer to [facility].  They have no beds available at this time and patient will be admitted to [facility] pending bed availability at [facility].  Evaluation revealed SBP with peritoneal ascitic fluid studies with > 250 PMN's, AKI with Cr 0.70>>1.90, and portal vein thrombus (pt poor candidate for anticoagulation due to thrombocytopenia).     Admitted to hospitalist service with GI and nephrology consulted.  On Rocephin for SBP.    On transfer list for [facility], pending an available bed.      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   Cultures growing streptococcal mitis/oralis sensitive to PCNs and Rocephin. Completed 5 day course Rocephin (9/19 >>9/23) --s/p albumin earlier in admission --Per chart review, on prophylactic Cipro as outpatient (organism is  resistant)   [place] / Cholangiocarcinoma / Ascites s/p Y90 radioembolization in 2019 followed by Gem/Cis and capecitabine monotherapy with disease progression currently on Bev/Atezo. Last dose on 03/26/2020.  Next chemo was planned for 9/22 @ [facility], will need to hold until infection resolved. --Oncology consulted --9/24 PM spoke to oncology attending at [facility].  Patient is medically stable and no longer in need of acute hospital to hospital transfer.  He has been taken off transfer list.  She will reach out to his outpatient oncology team regarding close follow up for resumption of chemotherapy. --Palliative care consulted - patient wishes to continue full scope of care and remain full code. --pain control: started lowest dose MS Contin 9/24.  Continue low dose dilaudid IV for breakthrough. --hold pain meds if overly sedated --Has peritoneal catheter in place --RN to drain 3 times weekly Tu/Th/Sat   Acute kidney injury - present on admission.  Resolved with albumin and holding home diuretics and treatment of SBP. --Resumed on home Lasix & spironolactone --monitor BMP --avoid nephrotoxins and hypotension, renally dose meds as indicated  Generalized weakness - Due to advanced malignancy and above acute illness.   PT and OT recommending SNF.    Hyponatremia - Resolved.  POA, mild, stable, 2/2 cirrhosis.  Monitor BMP.    Cirrhosis Hepatic encephalopathy - continues to be somnolent, minimally interactive.   Has been resumed on home lactulose, but occasionally refuses per nursing staff.  Monitor mental status. Goal at least 2-3 loose stools daily.    Portal vein thrombus - present on admission, seen on CT abdomen/pelvis.   Patient is a noted candidate for anticoagulation due to severe thrombocytopenia.  Thrombocytopenia - present on admission, stable.  Plt 19,000 with no evidence of bleeding.  Secondary to malignancy and chemotherapy.  Oncology following.  Monitor CBC and for signs of  bleeding.   SCDs for VTE prophylaxis.  Hypoxia - requiring 2 L/min supplemental oxygen, not on home oxygen.   Patient is asymptomatic from respiratory standpoint, CXR clear of infiltrates and lungs clear on exam.   --continue supplemental o2, maintain sat >90%, wean as tolerated   Abdominal wall ulcer - located RLQ, was treated for abdominal cellulitis recently at [facility].  Appears this may be a chronic wound at site of peritoneal catheter.  WOC consulted.  Monitor.   History of seizure disorder - appears pt not on antiepileptic medication.  Seizure precautions.  PRN Ativan if seizure activity.  Anxiety disorder - continue PRN Xanax  Chronic pain - continue PRN Dilaudid    DVT prophylaxis: Place and maintain sequential compression device Start: 05/26/20 0823 SCDs Start: 05/23/2020 0935   Diet:  Diet Orders (From admission, onward)    Start     Ordered   06/02/2020 0936  Diet 2 gram sodium Room service appropriate? Yes; Fluid consistency: Thin  Diet effective now       Question Answer Comment  Room service appropriate? Yes   Fluid consistency: Thin      05/29/2020 0936            Code Status: Full Code    Subjective 06/01/20    Patient appeared he was trying to get out of bed as I walked in this morning.  Says he needs to pee, doesn't have a urinal.  He denies other complaints but would not answer my questions about other symptoms including pain, N/V, F/C etc.  His abdomen is more distended today, but not tender on exam.  He won't answer whether his abdomen was drained yesterday.  Disposition Plan & Communication   Status is: Inpatient  Remains inpatient appropriate because:IV treatments appropriate due to intensity of illness or inability to take PO.  Needs SNF.  Unsafe discharge, lives alone and very weak.   Dispo: The patient is from: Home              Anticipated d/c is to: SNF              Anticipated d/c date is: 1 day -pending bed              Patient currently IS  medically stable to discharge    Family Communication: none at bedside, will attempt to call.    Consults, Procedures, Significant Events   Consultants:   Oncology  Palliative Care  Gastroenterology  Procedures:   None  Antimicrobials:  Anti-infectives (From admission, onward)   Start     Dose/Rate Route Frequency Ordered Stop   05/29/20 1300  cefTRIAXone (ROCEPHIN) 2 g in sodium chloride 0.9 % 100 mL IVPB        2 g 200 mL/hr over 30 Minutes Intravenous  Once 05/28/20 1354 05/29/20 1323   05/27/20 1600  rifaximin (XIFAXAN) tablet 550 mg        550 mg Oral 2 times daily 05/27/20 1506     05/27/20 0900  cefTRIAXone (ROCEPHIN) 2 g in sodium chloride 0.9 % 100 mL IVPB  Status:  Discontinued        2 g 200 mL/hr over 30 Minutes Intravenous Every 24 hours 05/26/20 1149 05/28/20 1302   05/09/2020 1800  cefTRIAXone (ROCEPHIN) 2 g in sodium chloride  0.9 % 100 mL IVPB  Status:  Discontinued        2 g 200 mL/hr over 30 Minutes Intravenous Every 12 hours 05/10/2020 0936 05/26/20 1149   05/10/2020 0615  cefTRIAXone (ROCEPHIN) 2 g in sodium chloride 0.9 % 100 mL IVPB        2 g 200 mL/hr over 30 Minutes Intravenous  Once 05/29/2020 0604 06/05/2020 0736        Objective   Vitals:   05/31/20 1721 05/31/20 2000 06/01/20 0442 06/01/20 1427  BP: 117/71 (!) 118/58 99/86 102/60  Pulse: 95 94 92 80  Resp:  18 20 18   Temp: 98.7 F (37.1 C) 97.7 F (36.5 C) 98 F (36.7 C) 98 F (36.7 C)  TempSrc: Oral Oral Oral Oral  SpO2: 93% 92% 93% 95%  Weight:      Height:        Intake/Output Summary (Last 24 hours) at 06/01/2020 1431 Last data filed at 06/01/2020 0522 Gross per 24 hour  Intake --  Output 350 ml  Net -350 ml   Filed Weights   05/29/20 0507 05/30/20 0422 05/31/20 0332  Weight: 74.3 kg 71.5 kg 71.5 kg    Physical Exam:  General exam: awake, no acute distress, cachectic Respiratory system: CTAB, normal respiratory effort. Cardiovascular system: normal S1/S2, RRR, no pedal  edema.   Gastrointestinal system: distended, non-tender on palpation  Skin: dry, intact, normal temperature, jaundice   Labs   Data Reviewed: I have personally reviewed following labs and imaging studies  CBC: Recent Labs  Lab 05/27/20 0544 05/28/20 0627 05/29/20 0441 05/30/20 0621 05/31/20 0529  WBC 5.2 3.2* 3.5* 3.6* 4.6  HGB 11.3* 11.5* 12.7* 12.2* 12.4*  HCT 34.4* 34.2* 37.9* 37.4* 38.1*  MCV 97.2 96.6 93.3 97.1 96.0  PLT 20* 25* 27* 19* 16*   Basic Metabolic Panel: Recent Labs  Lab 05/27/20 0544 05/28/20 0627 05/29/20 0441 05/30/20 0621 05/31/20 0529  NA 134* 134* 136 139 139  K 4.9 4.2 4.3 3.5 4.3  CL 94* 96* 96* 100 101  CO2 29 27 29 29 28   GLUCOSE 107* 102* 105* 105* 108*  BUN 39* 32* 32* 26* 33*  CREATININE 0.91 0.63 0.74 0.68 0.84  CALCIUM 9.2 9.0 8.9 8.6* 8.5*   GFR: Estimated Creatinine Clearance: 91 mL/min (by C-G formula based on SCr of 0.84 mg/dL). Liver Function Tests: Recent Labs  Lab 05/27/20 0544 05/28/20 0627 05/29/20 0441 05/30/20 0621 05/31/20 0529  AST 61* 60* 71* 71* 70*  ALT 14 16 19 21 21   ALKPHOS 74 75 87 89 87  BILITOT 4.8* 4.3* 3.6* 3.2* 3.3*  PROT 6.5 6.1* 6.2* 6.2* 5.9*  ALBUMIN 4.5 4.0 4.1 3.6 3.5   No results for input(s): LIPASE, AMYLASE in the last 168 hours. No results for input(s): AMMONIA in the last 168 hours. Coagulation Profile: No results for input(s): INR, PROTIME in the last 168 hours. Cardiac Enzymes: No results for input(s): CKTOTAL, CKMB, CKMBINDEX, TROPONINI in the last 168 hours. BNP (last 3 results) No results for input(s): PROBNP in the last 8760 hours. HbA1C: No results for input(s): HGBA1C in the last 72 hours. CBG: No results for input(s): GLUCAP in the last 168 hours. Lipid Profile: No results for input(s): CHOL, HDL, LDLCALC, TRIG, CHOLHDL, LDLDIRECT in the last 72 hours. Thyroid Function Tests: No results for input(s): TSH, T4TOTAL, FREET4, T3FREE, THYROIDAB in the last 72 hours. Anemia  Panel: No results for input(s): VITAMINB12, FOLATE, FERRITIN, TIBC, IRON, RETICCTPCT in the  last 72 hours. Sepsis Labs: No results for input(s): PROCALCITON, LATICACIDVEN in the last 168 hours.  Recent Results (from the past 240 hour(s))  Peritoneal Fluid culture ( includes gram stain)     Status: Abnormal   Collection Time: 05/21/2020  4:09 AM   Specimen: Peritoneal Washings; Peritoneal Fluid  Result Value Ref Range Status   Specimen Description   Final    PERITONEAL Performed at [facility], [address] [place], [place] [address]    Special Requests   Final    Immunocompromised Performed at [facility], [address] [place], [place] [address]    Gram Stain   Final    RARE WBC PRESENT, PREDOMINANTLY MONONUCLEAR NO ORGANISMS SEEN Performed at [facility], [place] [address], [place], [place] [address]    Culture STREPTOCOCCUS MITIS/ORALIS (A)  Final   Report Status 05/27/2020 FINAL  Final   Organism ID, Bacteria STREPTOCOCCUS MITIS/ORALIS  Final      Susceptibility   Streptococcus mitis/oralis - MIC*    PENICILLIN <=0.06 SENSITIVE Sensitive     CEFTRIAXONE <=0.12 SENSITIVE Sensitive     ERYTHROMYCIN 2 RESISTANT Resistant     LEVOFLOXACIN >=16 RESISTANT Resistant     VANCOMYCIN 0.25 SENSITIVE Sensitive     * STREPTOCOCCUS MITIS/ORALIS  Blood culture (routine x 2)     Status: None   Collection Time: 05/22/2020  4:14 AM   Specimen: BLOOD  Result Value Ref Range Status   Specimen Description BLOOD RIGHT ANTECUBITAL  Final   Special Requests   Final    BOTTLES DRAWN AEROBIC AND ANAEROBIC Blood Culture adequate volume   Culture   Final    NO GROWTH 5 DAYS Performed at [facility], [address] [place], [place] [address]    Report Status 05/30/2020 FINAL  Final  Blood culture (routine x 2)     Status: None   Collection Time: 06/04/2020  4:14 AM   Specimen: BLOOD  Result Value Ref Range Status   Specimen Description BLOOD BLOOD RIGHT  HAND  Final   Special Requests   Final    BOTTLES DRAWN AEROBIC AND ANAEROBIC Blood Culture results may not be optimal due to an excessive volume of blood received in culture bottles   Culture   Final    NO GROWTH 5 DAYS Performed at [facility], [address], [place], [place] [address]    Report Status 05/30/2020 FINAL  Final  SARS Coronavirus 2 by RT PCR (hospital order, performed in [facility] hospital lab) Nasopharyngeal Nasopharyngeal Swab     Status: None   Collection Time: 05/07/2020  8:31 AM   Specimen: Nasopharyngeal Swab  Result Value Ref Range Status   SARS Coronavirus 2 NEGATIVE NEGATIVE Final    Comment: (NOTE) SARS-CoV-2 target nucleic acids are NOT DETECTED.  The SARS-CoV-2 RNA is generally detectable in upper and lower respiratory specimens during the acute phase of infection. The lowest concentration of SARS-CoV-2 viral copies this assay can detect is 250 copies / mL. A negative result does not preclude SARS-CoV-2 infection and should not be used as the sole basis for treatment or other patient management decisions.  A negative result may occur with improper specimen collection / handling, submission of specimen other than nasopharyngeal swab, presence of viral mutation(s) within the areas targeted by this assay, and inadequate number of viral copies (<250 copies / mL). A negative result must be combined with clinical observations, patient history, and epidemiological information.  Fact  Sheet for Patients:   StrictlyIdeas.no  Fact Sheet for Healthcare Providers: BankingDealers.co.za  This test is not yet approved or  cleared by the [place] FDA and has been authorized for detection and/or diagnosis of SARS-CoV-2 by FDA under an Emergency Use Authorization (EUA).  This EUA will remain in effect (meaning this test can be used) for the duration of the COVID-19 declaration under Section 564(b)(1) of the  Act, 21 U.S.C. section 360bbb-3(b)(1), unless the authorization is terminated or revoked sooner.  Performed at [facility], [address] [place], [place] [address]       Imaging Studies   No results found.   Medications   Scheduled Meds: . baclofen  10 mg Oral TID  . collagenase   Topical Daily  . folic acid  1 mg Oral Daily  . furosemide  40 mg Oral Daily  . lactulose  20 g Oral BID  . loratadine  10 mg Oral Daily  . magnesium oxide  400 mg Oral Daily  . morphine  15 mg Oral Q12H  . pantoprazole  40 mg Oral Daily  . rifaximin  550 mg Oral BID  . sodium chloride flush  3 mL Intravenous Q12H  . spironolactone  25 mg Oral Daily   Continuous Infusions: . sodium chloride    . sodium chloride Stopped (05/13/2020 2338)       LOS: 7 days    Time spent: 20 minutes    Ezekiel Slocumb, DO [facility]  06/01/2020, 2:31 PM    If 7PM-7AM, please contact night-coverage. How to contact the [facility] Attending or Consulting provider [place] or covering provider during after hours [place], for this patient?    1. Check the care team in [facility] and look for a) attending/consulting [facility] provider listed and b) the [facility] team listed 2. Log into [email or web address] and use [place]'s universal password to access. If you do not have the password, please contact the hospital operator. 3. Locate the [facility] provider you are looking for under [facility] and page to a number that you can be directly reached. 4. If you still have difficulty reaching the provider, please page the [facility] (Director on Call) for the Hospitalists listed on amion for assistance.

## 2020-06-01 NOTE — Progress Notes (Addendum)
OT Cancellation Note  Patient Details Name: John [name] MRN: [id number] DOB: April 07, 1956   Cancelled Treatment:    Reason Eval/Treat Not Completed: Patient declined, no reason specified Will reattempt OT intervention at a later date.  Harrel Carina, MS, OTR/L 06/01/2020, 12:10 PM

## 2020-06-01 NOTE — Progress Notes (Signed)
29ml drained from Peritoneal cathete around 1730, dressing changed, site looks clean and intact. Will continue to monitor pt.

## 2020-06-02 NOTE — Progress Notes (Signed)
PROGRESS NOTE    John [name]   IRC:[id number]  DOB: 1956/07/22  PCP: Cletis Athens, MD    DOA: 05/24/2020 LOS: 8   Brief Narrative   John [name] is a 64 y.o. 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     Patient receives all his oncological care at [facility] and had requested transfer to [facility].  They have no beds available at this time and patient will be admitted to [facility] pending bed availability at [facility]  Evaluation revealed SBP with peritoneal ascitic fluid studies with > 250 PMN's, AKI with Cr 0.70>>1.90, and portal vein thrombus (pt poor candidate for anticoagulation due to thrombocytopenia).     Admitted to hospitalist service with GI and nephrology consulted.  On Rocephin for SBP.    On transfer list for [facility], pending an available bed.      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  Present on admission with ascitic fluid > 250 PMN's, worsening diffuse abdominal pain, in the setting of recently placed peritoneal drainage catheter on 05/07/20.   Cultures growing streptococcal mitis/oralis sensitive to PCNs and Rocephin. Completed 5 day course Rocephin (9/19 >>9/23) --s/p albumin earlier in admission --Per chart review, on prophylactic Cipro as outpatient (organism is  resistant)   [place] / Cholangiocarcinoma / Ascites s/p Y90 radioembolization in 2019 followed by Gem/Cis and capecitabine monotherapy with disease progression currently on Bev/Atezo. Last dose on 03/26/2020.  Next chemo was planned for 9/22 @ [facility], will need to hold until infection resolved. --Oncology consulted --9/24 PM spoke to oncology attending at [facility].  Patient is medically stable and no longer in need of acute hospital to hospital transfer.  He has been taken off transfer list.  She will reach out to his outpatient oncology team regarding close follow up for resumption of chemotherapy. --Palliative care consulted - patient wishes to continue full scope of care and remain full code. --pain control: started lowest dose MS Contin 9/24.  Continue low dose dilaudid IV for breakthrough. --hold pain meds if overly sedated --Has peritoneal catheter in place --RN to drain 3 times weekly Tu/Th/Sat   Acute kidney injury - present on admission.  Resolved with albumin and holding home diuretics and treatment of SBP. --Resumed on home Lasix & spironolactone --monitor BMP --avoid nephrotoxins and hypotension, renally dose meds as indicated  Generalized weakness - Due to advanced malignancy and above acute illness.   PT and OT recommending SNF.    Hyponatremia - Resolved.  POA, mild, stable, 2/2 cirrhosis.  Monitor BMP.    Cirrhosis Hepatic encephalopathy - continues to be somnolent, minimally interactive.   Has been resumed on home lactulose, but occasionally refuses per nursing staff.  Monitor mental status. Goal at least 2-3 loose stools daily.    Portal vein thrombus - present on admission, seen on CT abdomen/pelvis.   Patient is a noted candidate for anticoagulation due to  severe thrombocytopenia.  Thrombocytopenia - present on admission, stable.  Plt 19,000 with no evidence of bleeding.  Secondary to malignancy and chemotherapy.  Oncology following.  Monitor CBC and for signs of  bleeding.   SCDs for VTE prophylaxis.  Hypoxia - requiring 2 L/min supplemental oxygen, not on home oxygen.   Patient is asymptomatic from respiratory standpoint, CXR clear of infiltrates and lungs clear on exam.   --continue supplemental o2, maintain sat >90%, wean as tolerated   Abdominal wall ulcer - located RLQ, was treated for abdominal cellulitis recently at [facility].  Appears this may be a chronic wound at site of peritoneal catheter.  WOC consulted.  Monitor.   History of seizure disorder - appears pt not on antiepileptic medication.  Seizure precautions.  PRN Ativan if seizure activity.  Anxiety disorder - continue PRN Xanax  Chronic pain - continue PRN Dilaudid    DVT prophylaxis: Place and maintain sequential compression device Start: 05/26/20 0823 SCDs Start: 05/28/2020 0935   Diet:  Diet Orders (From admission, onward)    Start     Ordered   05/28/2020 0936  Diet 2 gram sodium Room service appropriate? Yes; Fluid consistency: Thin  Diet effective now       Question Answer Comment  Room service appropriate? Yes   Fluid consistency: Thin      05/12/2020 0936            Code Status: Full Code    Subjective 06/02/20    Patient seen with nursing during morning meds today.  He started vomiting after some of the medications.  He is irritated, saying we are making him sick.  When I asked to palpate his abdomen, he said "can't you do anything besides mash on it"?  Asked if he has been working with therapy of up out of bed, but he didn't answer.  Disposition Plan & Communication   Status is: Inpatient  Remains inpatient appropriate because:IV treatments appropriate due to intensity of illness or inability to take PO.  Needs SNF.  Unsafe discharge, lives alone and very weak.   Dispo: The patient is from: Home              Anticipated d/c is to: SNF              Anticipated d/c date is: 1 day -pending bed              Patient currently IS medically stable to  discharge    Family Communication: none at bedside, will attempt to call.    Consults, Procedures, Significant Events   Consultants:   Oncology  Palliative Care  Gastroenterology  Procedures:   None  Antimicrobials:  Anti-infectives (From admission, onward)   Start     Dose/Rate Route Frequency Ordered Stop   05/29/20 1300  cefTRIAXone (ROCEPHIN) 2 g in sodium chloride 0.9 % 100 mL IVPB        2 g 200 mL/hr over 30 Minutes Intravenous  Once 05/28/20 1354 05/29/20 1323   05/27/20 1600  rifaximin (XIFAXAN) tablet 550 mg        550 mg Oral 2 times daily 05/27/20 1506     05/27/20 0900  cefTRIAXone (ROCEPHIN) 2 g in sodium chloride 0.9 % 100 mL IVPB  Status:  Discontinued        2 g 200 mL/hr over 30 Minutes Intravenous Every 24 hours 05/26/20 1149 05/28/20 1302   05/16/2020 1800  cefTRIAXone (ROCEPHIN) 2 g in sodium chloride 0.9 %  100 mL IVPB  Status:  Discontinued        2 g 200 mL/hr over 30 Minutes Intravenous Every 12 hours 05/15/2020 0936 05/26/20 1149   05/30/2020 0615  cefTRIAXone (ROCEPHIN) 2 g in sodium chloride 0.9 % 100 mL IVPB        2 g 200 mL/hr over 30 Minutes Intravenous  Once 05/22/2020 0604 05/28/2020 0736        Objective   Vitals:   06/01/20 1725 06/02/20 0257 06/02/20 0746 06/02/20 1150  BP: 108/60 (!) 152/83 (!) 113/39 111/62  Pulse: 92 (!) 105 73 96  Resp: 16 18 20 19   Temp:  98.7 F (37.1 C) 98.2 F (36.8 C) 97.6 F (36.4 C)  TempSrc:   Oral Oral  SpO2: 91% 93% 90% 91%  Weight:  71.4 kg    Height:        Intake/Output Summary (Last 24 hours) at 06/02/2020 1312 Last data filed at 06/02/2020 0517 Gross per 24 hour  Intake 0 ml  Output 1450 ml  Net -1450 ml   Filed Weights   05/30/20 0422 05/31/20 0332 06/02/20 0257  Weight: 71.5 kg 71.5 kg 71.4 kg    Physical Exam:  General exam: awake, no acute distress, cachectic, irritable Respiratory system: CTAB, normal respiratory effort. Cardiovascular system: normal S1/S2, RRR, no pedal edema.    Gastrointestinal system: mildly distended, non-tender on palpation  Skin: dry, intact, normal temperature, jaundice   Labs   Data Reviewed: I have personally reviewed following labs and imaging studies  CBC: Recent Labs  Lab 05/27/20 0544 05/28/20 0627 05/29/20 0441 05/30/20 0621 05/31/20 0529  WBC 5.2 3.2* 3.5* 3.6* 4.6  HGB 11.3* 11.5* 12.7* 12.2* 12.4*  HCT 34.4* 34.2* 37.9* 37.4* 38.1*  MCV 97.2 96.6 93.3 97.1 96.0  PLT 20* 25* 27* 19* 16*   Basic Metabolic Panel: Recent Labs  Lab 05/27/20 0544 05/28/20 0627 05/29/20 0441 05/30/20 0621 05/31/20 0529  NA 134* 134* 136 139 139  K 4.9 4.2 4.3 3.5 4.3  CL 94* 96* 96* 100 101  CO2 29 27 29 29 28   GLUCOSE 107* 102* 105* 105* 108*  BUN 39* 32* 32* 26* 33*  CREATININE 0.91 0.63 0.74 0.68 0.84  CALCIUM 9.2 9.0 8.9 8.6* 8.5*   GFR: Estimated Creatinine Clearance: 90.9 mL/min (by C-G formula based on SCr of 0.84 mg/dL). Liver Function Tests: Recent Labs  Lab 05/27/20 0544 05/28/20 0627 05/29/20 0441 05/30/20 0621 05/31/20 0529  AST 61* 60* 71* 71* 70*  ALT 14 16 19 21 21   ALKPHOS 74 75 87 89 87  BILITOT 4.8* 4.3* 3.6* 3.2* 3.3*  PROT 6.5 6.1* 6.2* 6.2* 5.9*  ALBUMIN 4.5 4.0 4.1 3.6 3.5   No results for input(s): LIPASE, AMYLASE in the last 168 hours. No results for input(s): AMMONIA in the last 168 hours. Coagulation Profile: No results for input(s): INR, PROTIME in the last 168 hours. Cardiac Enzymes: No results for input(s): CKTOTAL, CKMB, CKMBINDEX, TROPONINI in the last 168 hours. BNP (last 3 results) No results for input(s): PROBNP in the last 8760 hours. HbA1C: No results for input(s): HGBA1C in the last 72 hours. CBG: No results for input(s): GLUCAP in the last 168 hours. Lipid Profile: No results for input(s): CHOL, HDL, LDLCALC, TRIG, CHOLHDL, LDLDIRECT in the last 72 hours. Thyroid Function Tests: No results for input(s): TSH, T4TOTAL, FREET4, T3FREE, THYROIDAB in the last 72 hours. Anemia  Panel: No results for input(s): VITAMINB12, FOLATE, FERRITIN, TIBC, IRON, RETICCTPCT in  the last 72 hours. Sepsis Labs: No results for input(s): PROCALCITON, LATICACIDVEN in the last 168 hours.  Recent Results (from the past 240 hour(s))  Peritoneal Fluid culture ( includes gram stain)     Status: Abnormal   Collection Time: 05/20/2020  4:09 AM   Specimen: Peritoneal Washings; Peritoneal Fluid  Result Value Ref Range Status   Specimen Description   Final    PERITONEAL Performed at [facility], [address] [place], [place] [address]    Special Requests   Final    Immunocompromised Performed at [facility], [address] [place], [place] [address]    Gram Stain   Final    RARE WBC PRESENT, PREDOMINANTLY MONONUCLEAR NO ORGANISMS SEEN Performed at [facility], [place] [address] [place], [place] [address]    Culture STREPTOCOCCUS MITIS/ORALIS (A)  Final   Report Status 05/27/2020 FINAL  Final   Organism ID, Bacteria STREPTOCOCCUS MITIS/ORALIS  Final      Susceptibility   Streptococcus mitis/oralis - MIC*    PENICILLIN <=0.06 SENSITIVE Sensitive     CEFTRIAXONE <=0.12 SENSITIVE Sensitive     ERYTHROMYCIN 2 RESISTANT Resistant     LEVOFLOXACIN >=16 RESISTANT Resistant     VANCOMYCIN 0.25 SENSITIVE Sensitive     * STREPTOCOCCUS MITIS/ORALIS  Blood culture (routine x 2)     Status: None   Collection Time: 05/18/2020  4:14 AM   Specimen: BLOOD  Result Value Ref Range Status   Specimen Description BLOOD RIGHT ANTECUBITAL  Final   Special Requests   Final    BOTTLES DRAWN AEROBIC AND ANAEROBIC Blood Culture adequate volume   Culture   Final    NO GROWTH 5 DAYS Performed at [facility], [address] [place], [place] [address]    Report Status 05/30/2020 FINAL  Final  Blood culture (routine x 2)     Status: None   Collection Time: 05/19/2020  4:14 AM   Specimen: BLOOD  Result Value Ref Range Status   Specimen Description BLOOD BLOOD RIGHT  HAND  Final   Special Requests   Final    BOTTLES DRAWN AEROBIC AND ANAEROBIC Blood Culture results may not be optimal due to an excessive volume of blood received in culture bottles   Culture   Final    NO GROWTH 5 DAYS Performed at [facility], [address], [place], [place] [address]    Report Status 05/30/2020 FINAL  Final  SARS Coronavirus 2 by RT PCR (hospital order, performed in [facility] hospital lab) Nasopharyngeal Nasopharyngeal Swab     Status: None   Collection Time: 05/19/2020  8:31 AM   Specimen: Nasopharyngeal Swab  Result Value Ref Range Status   SARS Coronavirus 2 NEGATIVE NEGATIVE Final    Comment: (NOTE) SARS-CoV-2 target nucleic acids are NOT DETECTED.  The SARS-CoV-2 RNA is generally detectable in upper and lower respiratory specimens during the acute phase of infection. The lowest concentration of SARS-CoV-2 viral copies this assay can detect is 250 copies / mL. A negative result does not preclude SARS-CoV-2 infection and should not be used as the sole basis for treatment or other patient management decisions.  A negative result may occur with improper specimen collection / handling, submission of specimen other than nasopharyngeal swab, presence of viral mutation(s) within the areas targeted by this assay, and inadequate number of viral copies (<250 copies / mL). A negative result must be combined with clinical observations, patient history, and epidemiological information.  Fact Sheet for Patients:   StrictlyIdeas.no  Fact Sheet for Healthcare Providers: BankingDealers.co.za  This test is not yet approved or  cleared by the [place] FDA and has been authorized for detection and/or diagnosis of SARS-CoV-2 by FDA under an Emergency Use Authorization (EUA).  This EUA will remain in effect (meaning this test can be used) for the duration of the COVID-19 declaration under Section 564(b)(1) of the  Act, 21 U.S.C. section 360bbb-3(b)(1), unless the authorization is terminated or revoked sooner.  Performed at [facility], [address] [place], [place] [address]       Imaging Studies   No results found.   Medications   Scheduled Meds: . baclofen  10 mg Oral TID  . collagenase   Topical Daily  . folic acid  1 mg Oral Daily  . furosemide  40 mg Oral Daily  . lactulose  20 g Oral BID  . loratadine  10 mg Oral Daily  . magnesium oxide  400 mg Oral Daily  . morphine  15 mg Oral Q12H  . pantoprazole  40 mg Oral Daily  . rifaximin  550 mg Oral BID  . sodium chloride flush  3 mL Intravenous Q12H  . spironolactone  25 mg Oral Daily   Continuous Infusions: . sodium chloride    . sodium chloride Stopped (05/08/2020 2338)       LOS: 8 days    Time spent: 20 minutes    Ezekiel Slocumb, DO [facility]  06/02/2020, 1:12 PM    If 7PM-7AM, please contact night-coverage. How to contact the [facility] Attending or Consulting provider [place] or covering provider during after hours [place], for this patient?    1. Check the care team in [facility] and look for a) attending/consulting TRH provider listed and b) the [facility] team listed 2. Log into [email or web address] and use [place]'s universal password to access. If you do not have the password, please contact the hospital operator. 3. Locate the [facility] provider you are looking for under [facility] and page to a number that you can be directly reached. 4. If you still have difficulty reaching the provider, please page the [facility] (Director on Call) for the Hospitalists listed on amion for assistance.

## 2020-06-02 NOTE — Care Management Important Message (Signed)
Important Message  Patient Details  Name: John [name] MRN: [id number] Date of Birth: November 29, 1955   Medicare Important Message Given:  Yes     Dannette Barbara 06/02/2020, 12:55 PM

## 2020-06-02 NOTE — Progress Notes (Signed)
Physical Therapy Treatment Patient Details Name: John [name] MRN: [id number] DOB: Jun 23, 1956 Today's Date: 06/02/2020    History of Present Illness 64 y.o. male with medical history significant for hepatocellular carcinoma and cholangiocarcinoma on bevacizumab/atezolizumab complicated by recurrent ascites status post peritoneal catheter which was placed on 09/01, history of chronic alcoholic pancreatitis, hepatitis B, COPD, seizure disorder who presented to the ED via EMS for evaluation of worsening abdominal pain    PT Comments    Upon arrival to room, pt found attempting to stand from bed to get to [facility] with friend present in room. Pt observed to be leaning forward for [facility] armrest outside of BOS and noted unsteadiness therefore clinician jumped in to provide CGA for safety to complete transfer to [facility]. Pt required total A for pericare while pt stood leaning forward on RW. Noted broken skin on L buttock during pericare. Pt then ambulated 10 feet with RW and CGA. Pt stood for prolonged period of time and then progressed to standing marches with CGA. Pt fatigued and requested to sit down. Pt required seated rest break then stood again to reposition hips better in bed due to [facility] elevated to highest level. Pt returned to supine with no physical assistance however required increased time to perform task. Pt remained on 2L O2 throughout session and noted no SOB with activity. Pt making progress in today's session participating in standing activities however continues to be limited by fatigue. Will continue to follow and progress pt as tolerated.    Follow Up Recommendations  SNF;Supervision - Intermittent     Equipment Recommendations  None recommended by PT;Other (comment) (has equipment)    Recommendations for Other Services       Precautions / Restrictions Precautions Precautions: Fall Restrictions Weight Bearing Restrictions: No    Mobility  Bed Mobility Overal bed mobility: Needs  Assistance Bed Mobility: Sit to Supine       Sit to supine: Supervision;HOB elevated   General bed mobility comments: pt required increased time to perform task with HOB elevated max height  Transfers Overall transfer level: Needs assistance Equipment used: Rolling walker (2 wheeled);None Transfers: Sit to/from Stand Sit to Stand: Min guard         General transfer comment: transfers with CGA with RW and without AD; pt with slightly impulsive movements moving faster than clinician able to manage lines and required verbal cues to wait until safe to move  Ambulation/Gait Ambulation/Gait assistance: Min guard Gait Distance (Feet): 10 Feet Assistive device: Rolling walker (2 wheeled) Gait Pattern/deviations: Decreased step length - right;Decreased step length - left Gait velocity: decreased   General Gait Details: pt with reciprocal pattern with decreased step lengths bilaterally; CGA for steadying, safety, and O2 line management   Stairs             Wheelchair Mobility    Modified Rankin (Stroke Patients Only)       Balance Overall balance assessment: Needs assistance Sitting-balance support: Bilateral upper extremity supported Sitting balance-Leahy Scale: Fair Sitting balance - Comments: fair balance sitting EOB with BUE assist on bed   Standing balance support: Bilateral upper extremity supported;During functional activity Standing balance-Leahy Scale: Fair Standing balance comment: fair balance requiring CGA for steadying with BUE on RW for balance                            Cognition Arousal/Alertness: Awake/alert Behavior During Therapy: WFL for tasks assessed/performed Overall Cognitive Status: Within  Functional Limits for tasks assessed                                        Exercises Other Exercises Other Exercises: pt performed promlonged standing and then standing marching with BUE on RW and CGA for steadying Other  Exercises: pt stood leaning over RW for pericare, which required total A    General Comments        Pertinent Vitals/Pain Pain Assessment: Faces Faces Pain Scale: Hurts little more Pain Location: abdomen Pain Descriptors / Indicators: Aching;Grimacing;Guarding Pain Intervention(s): Monitored during session    Home Living                      Prior Function            PT Goals (current goals can now be found in the care plan section) Acute Rehab PT Goals Patient Stated Goal: have less abdominal pain PT Goal Formulation: With patient Time For Goal Achievement: 06/13/20 Potential to Achieve Goals: Fair Progress towards PT goals: Progressing toward goals    Frequency    Min 2X/week      PT Plan Current plan remains appropriate    Co-evaluation              AM-PAC PT "6 Clicks" Mobility   Outcome Measure  Help needed turning from your back to your side while in a flat bed without using bedrails?: A Little Help needed moving from lying on your back to sitting on the side of a flat bed without using bedrails?: A Lot Help needed moving to and from a bed to a chair (including a wheelchair)?: A Little Help needed standing up from a chair using your arms (e.g., wheelchair or bedside chair)?: A Little Help needed to walk in hospital room?: A Little Help needed climbing 3-5 steps with a railing? : A Lot 6 Click Score: 16    End of Session Equipment Utilized During Treatment: Gait belt;Oxygen (2L O2) Activity Tolerance: Patient limited by fatigue Patient left: in bed;with call bell/phone within reach;with bed alarm set;with family/visitor present Nurse Communication: Mobility status PT Visit Diagnosis: Unsteadiness on feet (R26.81);Other abnormalities of gait and mobility (R26.89);Muscle weakness (generalized) (M62.81);Pain Pain - Right/Left:  (central) Pain - part of body:  (abdomen)     Time: 1027-2536 PT Time Calculation (min) (ACUTE ONLY): 40  min  Charges:                       Vale Haven, SPT   Vale Haven 06/02/2020, 3:14 PM

## 2020-06-02 NOTE — Progress Notes (Signed)
OT Cancellation Note  Patient Details Name: John [name] MRN: [id number] DOB: 07/04/1956   Cancelled Treatment:    Reason Eval/Treat Not Completed: Other (comment). Upon attempt, PT working with pt. Will re-attempt at later date/time as pt is available.  Jeni Salles, MPH, MS, OTR/L ascom [phone] 06/02/20, 1:46 PM

## 2020-06-02 NOTE — NC FL2 (Signed)
[place] LEVEL OF CARE SCREENING TOOL     IDENTIFICATION  Patient Name: John [name] Birthdate: 1956/01/16 Sex: male Admission Date (Current Location): 05/28/2020  [facility] and [place] Number:  Engineering geologist and Address:  [facility], [address], [place], [place] [address]      Provider Number: [id number]  Attending Physician Name and Address:  Ezekiel Slocumb, DO  Relative Name and Phone Number:       Current Level of Care: Hospital Recommended Level of Care: [place] Prior Approval Number:    Date Approved/Denied:   PASRR Number: pending  Discharge Plan: SNF    Current Diagnoses: Patient Active Problem List   Diagnosis Date Noted  . Pressure injury of skin 05/29/2020  . SBP (spontaneous bacterial peritonitis) ([place]) 05/10/2020  . Portal vein thrombosis 05/22/2020  . AKI (acute kidney injury) ([place]) 05/24/2020  . Pharmacologic therapy 02/27/2020  . Disorder of skeletal system 02/27/2020  . Problems influencing health status 02/27/2020  . Fever and chills 11/02/2019  . Epistaxis 11/02/2019  . 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 Cancer associated pain 01/04/2019  . Chronic abdominal pain, right upper quadrant (RUQ) 10/13/2018  . Cholangiocarcinoma ([place]) 07/07/2018  . Liver mass, right lobe 04/04/2018  . Goals of care, counseling/discussion 12/03/2017  . Nodule of upper lobe of left lung 12/02/2017  . History of brain tumor 07/07/2017  . De Quervain's tenosynovitis, left 07/07/2017  . Chronic thumb pain (Left) 06/30/2017  . Chronic pain syndrome 10/05/2016  . Neurogenic pain 01/21/2016  . Musculoskeletal pain 01/21/2016  . Chronic lower extremity pain (Secondary area of Pain) (Bilateral) (L>R) 01/21/2016  . Chronic upper extremity pain (Fourth area of Pain) (Bilateral) (R>L) 01/21/2016  . Abnormal MRI,  lumbar spine (2015) 01/21/2016  . Lumbar central spinal stenosis (Severe at L4-5; L5-S1) 01/21/2016  . Lumbar lateral recess stenosis (Bilateral L>R at L4-5; Left sided at L5-S1) 01/21/2016  . Lumbar foraminal stenosis (Left L5-S1) 01/21/2016  . Lumbar facet hypertrophy (L4-5 & L5-S1 L>R) 01/21/2016  . Abnormal MRI, cervical spine (2014) 01/21/2016  . Cervical foraminal stenosis (Right sided C3-4 & C4-5) (Left-sided C5-6 and C7-T1) (Bilateral C6-7) 01/21/2016  . Personal history of tobacco use, presenting hazards to health 01/09/2016  . Opioid-induced constipation (OIC) 11/18/2015  . Long term prescription opiate use 09/22/2015  . Encounter for chronic pain management 09/22/2015  . Radicular pain of shoulder (Bilateral) (R>L) 09/22/2015  . Encounter for therapeutic drug level monitoring 06/23/2015  . Long term current use of opiate analgesic 06/23/2015  . Opiate use (7.5 MME/Day) 06/23/2015  . Lumbar spondylosis 06/23/2015  . Chronic low back pain (Primary Area of Pain) (Bilateral) (L>R) 06/23/2015  . IVP/radiological dye Allergy 06/23/2015  . Thrombocytopenia ([place]) 06/23/2015  . History of alcoholism 06/23/2015  . Depression 06/23/2015  . Dystonia 06/23/2015  . Gastric ulcer 06/23/2015  . HCV (hepatitis C virus) 06/23/2015  . H/O neoplasm 06/23/2015  . BP (high blood pressure) 06/23/2015  . Decreased leukocytes 06/23/2015  . Hepatic cirrhosis ([place]) 06/23/2015  . Current tobacco use 06/23/2015  . Has a tremor 06/23/2015  . Osteoarthritis of spine with radiculopathy, lumbosacral region 06/23/2015  . Peripheral neuropathy ([place]) 37/06/6268  . Chronic neck pain (Third area of Pain) (Bilateral) (R>L) 06/23/2015  . Cervical spondylosis 06/23/2015  . Obstructive sleep apnea 06/23/2015  . History of panic attacks 06/23/2015  . GERD (gastroesophageal reflux disease) 06/23/2015  .  Chronic constipation 06/23/2015  . 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[place]) 06/23/2015  . Leukopenia  06/23/2015  . Lumbar facet syndrome (Bilateral) (L>R) 06/23/2015  . Chronic lumbar radicular pain (Right S1 and Left L5) (Bilateral) (L>R) 06/23/2015  . Cervical radicular pain (Bilateral) (R>L) 06/23/2015  . Chronic obstructive pulmonary disease (COPD) ([place]) 06/23/2015  . Head injury 06/23/2015  . Epileptic disorder ([place]) 06/23/2015  . Carpal tunnel syndrome 01/07/2015  . Bilateral carpal tunnel syndrome 01/07/2015  . Foot pain, right 09/11/2014  . Difficulty in walking 05/06/2014  . Loss of feeling or sensation 05/06/2014  . Absence of sensation 05/06/2014  . Back pain 05/06/2014  . Splenomegaly 05/30/2013    Orientation RESPIRATION BLADDER Height & Weight     Self  Normal Continent Weight: 157 lb 5.1 oz (71.4 kg) Height:  6' (182.9 cm)  BEHAVIORAL SYMPTOMS/MOOD NEUROLOGICAL BOWEL NUTRITION STATUS      Continent Diet (2 gram sodium, thin liquids)  AMBULATORY STATUS COMMUNICATION OF NEEDS Skin   Extensive Assist Verbally PU Stage and Appropriate Care (open wound on right abdomen)     PU Stage 3 Dressing:  (sacrum, foam dressing, change every 3 days)                 Personal Care Assistance Level of Assistance  Bathing, Feeding, Dressing Bathing Assistance: Maximum assistance Feeding assistance: Independent Dressing Assistance: Maximum assistance     Functional Limitations Info  Sight, Hearing, Speech Sight Info: Adequate Hearing Info: Adequate Speech Info: Adequate    SPECIAL CARE FACTORS FREQUENCY  PT (By licensed PT), OT (By licensed OT)     PT Frequency: 5x OT Frequency: 5x            Contractures Contractures Info: Not present    Additional Factors Info  Code Status, Allergies Code Status Info: Full Code Allergies Info: Codeine, Morphine, Duloxetine, Oxycodone           Current Medications (06/02/2020):  This is the current hospital active medication list Current Facility-Administered Medications  Medication Dose Route Frequency Provider Last  Rate Last Admin  . 0.9 %  sodium chloride infusion  250 mL Intravenous PRN Agbata, Tochukwu, MD      . 0.9 %  sodium chloride infusion   Intravenous PRN Collier Bullock, MD   Stopped at 05/24/2020 2338  . ALPRAZolam Duanne Moron) tablet 0.25 mg  0.25 mg Oral QHS PRN Agbata, Tochukwu, MD   0.25 mg at 05/31/20 2136  . baclofen (LIORESAL) tablet 10 mg  10 mg Oral TID Collier Bullock, MD   10 mg at 06/02/20 1135  . collagenase (SANTYL) ointment   Topical Daily Gwynne Edinger, MD   1 application at 02/63/78 [phone]  . dextromethorphan-guaiFENesin (MUCINEX DM) 30-600 MG per 12 hr tablet 1 tablet  1 tablet Oral BID PRN Lang Snow, NP   1 tablet at 05/28/20 0530  . folic acid (FOLVITE) tablet 1 mg  1 mg Oral Daily Agbata, Tochukwu, MD   1 mg at 06/02/20 1135  . furosemide (LASIX) tablet 40 mg  40 mg Oral Daily Gwynne Edinger, MD   40 mg at 06/02/20 1135  . HYDROmorphone (DILAUDID) injection 0.5 mg  0.5 mg Intravenous Q4H PRN Nicole Kindred A, DO   0.5 mg at 06/02/20 0548  . lactulose (CHRONULAC) 10 GM/15ML solution 20 g  20 g Oral BID Gwynne Edinger, MD   20 g at 06/02/20 0945  . magic mouthwash  5 mL Oral QID PRN Ezekiel Slocumb, DO  And  . lidocaine (XYLOCAINE) 2 % viscous mouth solution 5 mL  5 mL Mouth/Throat QID PRN Nicole Kindred A, DO      . loratadine (CLARITIN) tablet 10 mg  10 mg Oral Daily Agbata, Tochukwu, MD   10 mg at 06/02/20 1135  . LORazepam (ATIVAN) injection 2 mg  2 mg Intravenous Q4H PRN Agbata, Tochukwu, MD      . magnesium oxide (MAG-OX) tablet 400 mg  400 mg Oral Daily Agbata, Tochukwu, MD   400 mg at 06/02/20 1135  . morphine (MS CONTIN) 12 hr tablet 15 mg  15 mg Oral Q12H Nicole Kindred A, DO   15 mg at 06/02/20 1135  . ondansetron (ZOFRAN) tablet 4 mg  4 mg Oral Q6H PRN Agbata, Tochukwu, MD       Or  . ondansetron (ZOFRAN) injection 4 mg  4 mg Intravenous Q6H PRN Agbata, Tochukwu, MD   4 mg at 06/02/20 1027  . pantoprazole (PROTONIX) EC tablet 40 mg   40 mg Oral Daily Agbata, Tochukwu, MD   40 mg at 06/02/20 1135  . rifaximin (XIFAXAN) tablet 550 mg  550 mg Oral BID [name], MD   550 mg at 06/02/20 0942  . sodium chloride flush (NS) 0.9 % injection 3 mL  3 mL Intravenous Q12H Agbata, Tochukwu, MD   3 mL at 06/02/20 0945  . sodium chloride flush (NS) 0.9 % injection 3 mL  3 mL Intravenous PRN Agbata, Tochukwu, MD   3 mL at 06/02/20 0547  . spironolactone (ALDACTONE) tablet 25 mg  25 mg Oral Daily Gwynne Edinger, MD   25 mg at 06/02/20 1135     Discharge Medications: Please see discharge summary for a list of discharge medications.  Relevant Imaging Results:  Relevant Lab Results:   Additional Information SSN:[id number]  Eileen Stanford, LCSW

## 2020-06-03 DIAGNOSIS — D696 Thrombocytopenia, unspecified: Secondary | ICD-10-CM

## 2020-06-03 LAB — CBC
HCT: 40 % (ref 39.0–52.0)
Hemoglobin: 12.6 g/dL — ABNORMAL LOW (ref 13.0–17.0)
MCH: 30.9 pg (ref 26.0–34.0)
MCHC: 31.5 g/dL (ref 30.0–36.0)
MCV: 98 fL (ref 80.0–100.0)
Platelets: 16 10*3/uL — CL (ref 150–400)
RBC: 4.08 MIL/uL — ABNORMAL LOW (ref 4.22–5.81)
RDW: 20 % — ABNORMAL HIGH (ref 11.5–15.5)
WBC: 6.4 10*3/uL (ref 4.0–10.5)
nRBC: 0 % (ref 0.0–0.2)

## 2020-06-03 LAB — ABO/RH: ABO/RH(D): A POS

## 2020-06-03 LAB — TYPE AND SCREEN
ABO/RH(D): A POS
Antibody Screen: NEGATIVE

## 2020-06-03 MED ORDER — AVATROMBOPAG MALEATE 20 MG PO TABS: 40.0000 mg | ORAL_TABLET | Freq: Every day | ORAL | Status: DC

## 2020-06-03 MED ORDER — SODIUM CHLORIDE 0.9% IV SOLUTION: Freq: Once | INTRAVENOUS | Status: DC

## 2020-06-03 MED ORDER — CIPROFLOXACIN HCL 500 MG PO TABS
500.0000 mg | ORAL_TABLET | Freq: Every day | ORAL | Status: DC
  Administered 2020-06-03 – 2020-06-21 (×18): 500 mg via ORAL
  Filled 2020-06-03 (×19): qty 1

## 2020-06-03 NOTE — Progress Notes (Addendum)
PROGRESS NOTE    YIANNI SKILLING   EHM:[id number]  DOB: 1956-06-08  PCP: Cletis Athens, MD    DOA: 05/22/2020 LOS: 9   Brief Narrative   John [name] is a 64 y.o. 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     Patient receives all his oncological care at [facility] and had requested transfer to [facility].  They have no beds available at this time and patient will be admitted to [facility] pending bed availability at [facility].  Evaluation revealed SBP with peritoneal ascitic fluid studies with > 250 PMN's, AKI with Cr 0.70>>1.90, and portal vein thrombus (pt poor candidate for anticoagulation due to thrombocytopenia).     Admitted to hospitalist service with GI and nephrology consulted.  On Rocephin for SBP.    On transfer list for [facility], pending an available bed.      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  Present on admission with ascitic fluid > 250 PMN's, worsening diffuse abdominal pain, in the setting of recently placed peritoneal drainage catheter on 05/07/20.   Cultures growing streptococcal mitis/oralis sensitive to PCNs and Rocephin. Completed 5 day course Rocephin (9/19 >>9/23) --s/p albumin earlier in admission --resumed his prophylactic Cipro (takes as outpatient) (SBP organism was  resistant)   MixedHCC / Cholangiocarcinoma / Ascites s/p Y90 radioembolization in 2019 followed by Gem/Cis and capecitabine monotherapy with disease progression currently on Bev/Atezo. Last dose on 03/26/2020.  Next chemo was planned for 9/22 @ [facility], will need to hold until infection resolved. --Oncology consulted --9/24 PM spoke to oncology attending at [facility].  Patient is medically stable and no longer in need of acute hospital to hospital transfer.  He has been taken off transfer list.  She will reach out to his outpatient oncology team regarding close follow up for resumption of chemotherapy. --Palliative care consulted - patient wishes to continue full scope of care and remain full code. --pain control: started lowest dose MS Contin 9/24.  Continue low dose dilaudid IV for breakthrough. --hold pain meds if overly sedated --Has peritoneal catheter in place - RN to drain 3 times weekly Tu/Th/Sat 9/28 reached back out to Dr. Grayland Ormond since patient may be here awhile, ?chemo here a possibility?   Acute kidney injury - present on admission.  Resolved with albumin and holding home diuretics and treatment of SBP. --Resumed on home Lasix & spironolactone --monitor BMP --avoid nephrotoxins and hypotension, renally dose meds as indicated  Generalized weakness - Due to advanced malignancy and above acute illness.   PT and OT recommending SNF.   SNF placement will be challenging as patient is on chemotherapy and will need transportation to/from that facility has to cover cost.    Hyponatremia - Resolved.  POA, mild, stable, 2/2 cirrhosis.  Monitor BMP.    Cirrhosis Hepatic encephalopathy - continues to be somnolent, minimally interactive.   Has been resumed on home lactulose, but  occasionally refuses per nursing staff.  Monitor mental status. Goal at least 2-3 loose stools daily.    Portal vein thrombus - present on admission, seen on CT abdomen/pelvis.   Patient is a noted candidate for  anticoagulation due to severe thrombocytopenia.  Thrombocytopenia - present on admission, stable.  Plt 19,000 with no evidence of bleeding.  Secondary to malignancy and ?chemotherapy.  Oncology following.  Monitor CBC and for signs of bleeding.   SCDs for VTE prophylaxis. --Resume Doptelet (needs to be brought from home)  Hypoxia - requiring 2 L/min supplemental oxygen, not on home oxygen.   Patient is asymptomatic from respiratory standpoint, CXR clear of infiltrates and lungs clear on exam.   --continue supplemental o2, maintain sat >90%, wean as tolerated   Abdominal wall ulcer - located RLQ, was treated for abdominal cellulitis recently at [facility].  Appears this may be a chronic wound at site of peritoneal catheter.  WOC consulted.  Monitor.   History of seizure disorder - appears pt not on antiepileptic medication.  Seizure precautions.  PRN Ativan if seizure activity.  Anxiety disorder - continue PRN Xanax  Chronic pain - continue PRN Dilaudid    DVT prophylaxis: Place and maintain sequential compression device Start: 05/26/20 0823 SCDs Start: 05/18/2020 0935   Diet:  Diet Orders (From admission, onward)    Start     Ordered   05/23/2020 0936  Diet 2 gram sodium Room service appropriate? Yes; Fluid consistency: Thin  Diet effective now       Question Answer Comment  Room service appropriate? Yes   Fluid consistency: Thin      05/24/2020 0936            Code Status: Full Code    Subjective 06/03/20    Patient seen at bedside today.  He is awake and actually engages in conversation somewhat today.  He had just taken meds and says he hates lactulose, "have you ever tried it?".  Says pain is better controlled.  No acute complaints.  Disposition Plan & Communication   Status is: Inpatient  Remains inpatient appropriate because:IV treatments appropriate due to intensity of illness or inability to take PO.  Needs SNF.  Unsafe discharge, lives alone and very  weak.   Dispo: The patient is from: Home              Anticipated d/c is to: SNF              Anticipated d/c date is: unknown, SNF placement challenging as pt is on chemo              Patient currently IS medically stable to discharge    Family Communication: attempted to reach brother Chrissie Noa by phone this afternoon unsuccessfully   Consults, Procedures, Significant Events   Consultants:   Oncology  Palliative Care  Gastroenterology  Procedures:   None  Antimicrobials:  Anti-infectives (From admission, onward)   Start     Dose/Rate Route Frequency Ordered Stop   06/03/20 1000  ciprofloxacin (CIPRO) tablet 500 mg        500 mg Oral Daily 06/03/20 0748     05/29/20 1300  cefTRIAXone (ROCEPHIN) 2 g in sodium chloride 0.9 % 100 mL IVPB        2 g 200 mL/hr over 30 Minutes Intravenous  Once 05/28/20 1354 05/29/20 1323   05/27/20 1600  rifaximin (XIFAXAN) tablet 550 mg        550 mg Oral 2 times daily  05/27/20 1506     05/27/20 0900  cefTRIAXone (ROCEPHIN) 2 g in sodium chloride 0.9 % 100 mL IVPB  Status:  Discontinued        2 g 200 mL/hr over 30 Minutes Intravenous Every 24 hours 05/26/20 1149 05/28/20 1302   05/08/2020 1800  cefTRIAXone (ROCEPHIN) 2 g in sodium chloride 0.9 % 100 mL IVPB  Status:  Discontinued        2 g 200 mL/hr over 30 Minutes Intravenous Every 12 hours 05/20/2020 0936 05/26/20 1149   05/15/2020 0615  cefTRIAXone (ROCEPHIN) 2 g in sodium chloride 0.9 % 100 mL IVPB        2 g 200 mL/hr over 30 Minutes Intravenous  Once 05/29/2020 0604 05/18/2020 0736        Objective   Vitals:   06/03/20 0757 06/03/20 1143 06/03/20 1158 06/03/20 1256  BP: 110/88 (!) 95/54 (!) 98/56 (!) 107/56  Pulse: 84 86 83 86  Resp: 18 16 16 18   Temp: 98.1 F (36.7 C) 98.5 F (36.9 C) 98.4 F (36.9 C) 97.6 F (36.4 C)  TempSrc: Oral Oral Oral Oral  SpO2: 97% 91% 92% 92%  Weight:      Height:        Intake/Output Summary (Last 24 hours) at 06/03/2020 1330 Last data filed  at 06/03/2020 1256 Gross per 24 hour  Intake 482 ml  Output 400 ml  Net 82 ml   Filed Weights   05/30/20 0422 05/31/20 0332 06/02/20 0257  Weight: 71.5 kg 71.5 kg 71.4 kg    Physical Exam:  General exam: awake, no acute distress, cachectic, irritable Respiratory system: CTAB, normal respiratory effort. Cardiovascular system: normal S1/S2, RRR, no pedal edema.   Gastrointestinal system: distended, non-tender on palpation, peritoneal drainage catheter in place  Skin: dry, intact, normal temperature, jaundice   Labs   Data Reviewed: I have personally reviewed following labs and imaging studies  CBC: Recent Labs  Lab 05/28/20 0627 05/29/20 0441 05/30/20 0621 05/31/20 0529 06/03/20 0343  WBC 3.2* 3.5* 3.6* 4.6 6.4  HGB 11.5* 12.7* 12.2* 12.4* 12.6*  HCT 34.2* 37.9* 37.4* 38.1* 40.0  MCV 96.6 93.3 97.1 96.0 98.0  PLT 25* 27* 19* 16* 16*   Basic Metabolic Panel: Recent Labs  Lab 05/28/20 0627 05/29/20 0441 05/30/20 0621 05/31/20 0529  NA 134* 136 139 139  K 4.2 4.3 3.5 4.3  CL 96* 96* 100 101  CO2 27 29 29 28   GLUCOSE 102* 105* 105* 108*  BUN 32* 32* 26* 33*  CREATININE 0.63 0.74 0.68 0.84  CALCIUM 9.0 8.9 8.6* 8.5*   GFR: Estimated Creatinine Clearance: 90.9 mL/min (by C-G formula based on SCr of 0.84 mg/dL). Liver Function Tests: Recent Labs  Lab 05/28/20 0627 05/29/20 0441 05/30/20 0621 05/31/20 0529  AST 60* 71* 71* 70*  ALT 16 19 21 21   ALKPHOS 75 87 89 87  BILITOT 4.3* 3.6* 3.2* 3.3*  PROT 6.1* 6.2* 6.2* 5.9*  ALBUMIN 4.0 4.1 3.6 3.5   No results for input(s): LIPASE, AMYLASE in the last 168 hours. No results for input(s): AMMONIA in the last 168 hours. Coagulation Profile: No results for input(s): INR, PROTIME in the last 168 hours. Cardiac Enzymes: No results for input(s): CKTOTAL, CKMB, CKMBINDEX, TROPONINI in the last 168 hours. BNP (last 3 results) No results for input(s): PROBNP in the last 8760 hours. HbA1C: No results for input(s):  HGBA1C in the last 72 hours. CBG: No results for input(s): GLUCAP in the last  168 hours. Lipid Profile: No results for input(s): CHOL, HDL, LDLCALC, TRIG, CHOLHDL, LDLDIRECT in the last 72 hours. Thyroid Function Tests: No results for input(s): TSH, T4TOTAL, FREET4, T3FREE, THYROIDAB in the last 72 hours. Anemia Panel: No results for input(s): VITAMINB12, FOLATE, FERRITIN, TIBC, IRON, RETICCTPCT in the last 72 hours. Sepsis Labs: No results for input(s): PROCALCITON, LATICACIDVEN in the last 168 hours.  Recent Results (from the past 240 hour(s))  Peritoneal Fluid culture ( includes gram stain)     Status: Abnormal   Collection Time: 05/14/2020  4:09 AM   Specimen: Peritoneal Washings; Peritoneal Fluid  Result Value Ref Range Status   Specimen Description   Final    PERITONEAL Performed at [facility], [address] [place], [place] [address]    Special Requests   Final    Immunocompromised Performed at [facility], [address] [place], [place] [address]    Gram Stain   Final    RARE WBC PRESENT, PREDOMINANTLY MONONUCLEAR NO ORGANISMS SEEN Performed at [facility], [place] [address] [place], [place] [address]    Culture STREPTOCOCCUS MITIS/ORALIS (A)  Final   Report Status 05/27/2020 FINAL  Final   Organism ID, Bacteria STREPTOCOCCUS MITIS/ORALIS  Final      Susceptibility   Streptococcus mitis/oralis - MIC*    PENICILLIN <=0.06 SENSITIVE Sensitive     CEFTRIAXONE <=0.12 SENSITIVE Sensitive     ERYTHROMYCIN 2 RESISTANT Resistant     LEVOFLOXACIN >=16 RESISTANT Resistant     VANCOMYCIN 0.25 SENSITIVE Sensitive     * STREPTOCOCCUS MITIS/ORALIS  Blood culture (routine x 2)     Status: None   Collection Time: 05/11/2020  4:14 AM   Specimen: BLOOD  Result Value Ref Range Status   Specimen Description BLOOD RIGHT ANTECUBITAL  Final   Special Requests   Final    BOTTLES DRAWN AEROBIC AND ANAEROBIC Blood Culture adequate volume   Culture   Final     NO GROWTH 5 DAYS Performed at [facility], [address] [place], [place] [address]    Report Status 05/30/2020 FINAL  Final  Blood culture (routine x 2)     Status: None   Collection Time: 06/03/2020  4:14 AM   Specimen: BLOOD  Result Value Ref Range Status   Specimen Description BLOOD BLOOD RIGHT HAND  Final   Special Requests   Final    BOTTLES DRAWN AEROBIC AND ANAEROBIC Blood Culture results may not be optimal due to an excessive volume of blood received in culture bottles   Culture   Final    NO GROWTH 5 DAYS Performed at [facility], [address] [place], [place] [address]    Report Status 05/30/2020 FINAL  Final  SARS Coronavirus 2 by RT PCR (hospital order, performed in [facility] hospital lab) Nasopharyngeal Nasopharyngeal Swab     Status: None   Collection Time: 05/09/2020  8:31 AM   Specimen: Nasopharyngeal Swab  Result Value Ref Range Status   SARS Coronavirus 2 NEGATIVE NEGATIVE Final    Comment: (NOTE) SARS-CoV-2 target nucleic acids are NOT DETECTED.  The SARS-CoV-2 RNA is generally detectable in upper and lower respiratory specimens during the acute phase of infection. The lowest concentration of SARS-CoV-2 viral copies this assay can detect is 250 copies / mL. A negative result does not preclude SARS-CoV-2 infection and should not be used as the sole basis for treatment or other patient management decisions.  A negative result may occur  with improper specimen collection / handling, submission of specimen other than nasopharyngeal swab, presence of viral mutation(s) within the areas targeted by this assay, and inadequate number of viral copies (<250 copies / mL). A negative result must be combined with clinical observations, patient history, and epidemiological information.  Fact Sheet for Patients:   StrictlyIdeas.no  Fact Sheet for Healthcare Providers: BankingDealers.co.za  This test is  not yet approved or  cleared by the [place] FDA and has been authorized for detection and/or diagnosis of SARS-CoV-2 by FDA under an Emergency Use Authorization (EUA).  This EUA will remain in effect (meaning this test can be used) for the duration of the COVID-19 declaration under Section 564(b)(1) of the Act, 21 U.S.C. section 360bbb-3(b)(1), unless the authorization is terminated or revoked sooner.  Performed at [facility], [address], [place], [place] [address]       Imaging Studies   No results found.   Medications   Scheduled Meds: . sodium chloride   Intravenous Once  . Avatrombopag Maleate  40 mg Oral Daily  . baclofen  10 mg Oral TID  . ciprofloxacin  500 mg Oral Daily  . collagenase   Topical Daily  . folic acid  1 mg Oral Daily  . furosemide  40 mg Oral Daily  . lactulose  20 g Oral BID  . loratadine  10 mg Oral Daily  . magnesium oxide  400 mg Oral Daily  . morphine  15 mg Oral Q12H  . pantoprazole  40 mg Oral Daily  . rifaximin  550 mg Oral BID  . sodium chloride flush  3 mL Intravenous Q12H  . spironolactone  25 mg Oral Daily   Continuous Infusions: . sodium chloride    . sodium chloride Stopped (05/24/2020 2338)       LOS: 9 days    Time spent: 15 minutes    Ezekiel Slocumb, DO [facility]  06/03/2020, 1:30 PM    If 7PM-7AM, please contact night-coverage. How to contact the [facility] Attending or Consulting provider [place] or covering provider during after hours [place], for this patient?    1. Check the care team in [facility] and look for a) attending/consulting TR[facility] provider listed and b) the [facility] team listed 2. Log into [email or web address] and use [place]'s universal password to access. If you do not have the password, please contact the hospital operator. 3. Locate the [facility] provider you are looking for under [facility] and page to a number that you can be directly reached. 4. If you still have difficulty reaching the  provider, please page the [facility] (Director on Call) for the Hospitalists listed on amion for assistance.

## 2020-06-03 NOTE — Progress Notes (Signed)
PT Cancellation Note  Patient Details Name: John [name] MRN: [id number] DOB: 1955-12-03   Cancelled Treatment:    Reason Eval/Treat Not Completed: Other (comment) Upon arrival, pt asleep in bed. Pt awoke with verbal stimuli. Pt's L arm noted to be supinated with ice pack on lower half with pt reporting pain after IV removal. Pt agreeable to participate then states "I think I'm giving out" and pt noted to fall asleep. Pt then unable to rouse. Will attempt another date/time when pt able to remain awake/alert and able to participate in PT session.   [place] 06/03/2020, 1:26 PM

## 2020-06-03 NOTE — Progress Notes (Signed)
OT Cancellation Note  Patient Details Name: John [name] MRN: [id number] DOB: [birth year]   Cancelled Treatment:    Reason Eval/Treat Not Completed: Other (comment). Upon attempt, pt on BSC. RN outside pt's room reports she just got pt to the [facility] and he would need additional time for toileting. Additionally the RN reports pt will be receiving platelets soon and recommended holding therapy this morning and re-attempting in the afternoon as appropriate.   Jeni Salles, MPH, MS, OTR/L ascom [phone] 06/03/20, 10:37 AM

## 2020-06-03 NOTE — Progress Notes (Signed)
Pts Pleurx catheter drained at 1730 and 1457mL was removed. New dressing applied. Pt tolerated well. Will continue to monitor.

## 2020-06-04 ENCOUNTER — Encounter: Payer: Self-pay | Admitting: Internal Medicine

## 2020-06-04 DIAGNOSIS — K652 Spontaneous bacterial peritonitis: Secondary | ICD-10-CM | POA: Diagnosis not present

## 2020-06-04 LAB — CBC
HCT: 41.8 % (ref 39.0–52.0)
Hemoglobin: 13.4 g/dL (ref 13.0–17.0)
MCH: 30.9 pg (ref 26.0–34.0)
MCHC: 32.1 g/dL (ref 30.0–36.0)
MCV: 96.5 fL (ref 80.0–100.0)
Platelets: 22 10*3/uL — CL (ref 150–400)
RBC: 4.33 MIL/uL (ref 4.22–5.81)
RDW: 19.8 % — ABNORMAL HIGH (ref 11.5–15.5)
WBC: 11.4 10*3/uL — ABNORMAL HIGH (ref 4.0–10.5)
nRBC: 0 % (ref 0.0–0.2)

## 2020-06-04 LAB — PREPARE PLATELET PHERESIS: Unit division: 0

## 2020-06-04 LAB — BASIC METABOLIC PANEL
Anion gap: 13 (ref 5–15)
BUN: 46 mg/dL — ABNORMAL HIGH (ref 8–23)
CO2: 28 mmol/L (ref 22–32)
Calcium: 8.9 mg/dL (ref 8.9–10.3)
Chloride: 96 mmol/L — ABNORMAL LOW (ref 98–111)
Creatinine, Ser: 0.95 mg/dL (ref 0.61–1.24)
GFR calc Af Amer: >60 mL/min (ref >60)
GFR calc non Af Amer: >60 mL/min (ref >60)
Glucose, Bld: 110 mg/dL — ABNORMAL HIGH (ref 70–99)
Potassium: 5.3 mmol/L — ABNORMAL HIGH (ref 3.5–5.1)
Sodium: 137 mmol/L (ref 135–145)

## 2020-06-04 LAB — BPAM PLATELET PHERESIS
Blood Product Expiration Date: 202109302359
ISSUE DATE / TIME: 202109281132
Unit Type and Rh: 5100

## 2020-06-04 MED ORDER — FUROSEMIDE 40 MG PO TABS
40.0000 mg | ORAL_TABLET | Freq: Two times a day (BID) | ORAL | Status: DC
  Administered 2020-06-04 – 2020-06-09 (×9): 40 mg via ORAL
  Filled 2020-06-04 (×9): qty 1

## 2020-06-04 MED ORDER — HYDROMORPHONE HCL 1 MG/ML IJ SOLN: 0.5000 mg | Freq: Three times a day (TID) | INTRAMUSCULAR | Status: DC | PRN

## 2020-06-04 NOTE — Progress Notes (Signed)
Occupational Therapy Treatment Patient Details Name: John [name] MRN: [id number] DOB: 1955-09-28 Today's Date: 06/04/2020    History of present illness 64 y.o. 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 Upon arrival to room pt in bed, HOB elevated, attempting to eat dinner but having difficulty getting food to mouth -- dropped glass of iced tea in his lap. Pt stated he needed to use bathroom, so transferred from bed to [facility], requiring CGA and frequent VCs to move to sitting EOB, then MinA + RW for standing and pivoting to [facility]. MaxA for perianal hygiene. Pt highly agitated throughout session, swearing and cursing therapist and taking a few swings (no contact made). Pt also experiencing tremors throughout UB and complained of pain, but could not say where the pain was. During transition from [facility] to EOB, pt came to standing with MinA and RW and then refused to move -- would not sit at EOB despite clearly becoming fatigued and shaking. Finally able to persuade pt to sit, then had to again cajole him into returning supine in bed. Pt left in bed with call bell within reach and bed alarm activated. Nurse notified re: pt's pain, tremors, and combativeness and NA notified re need for new condom catheter. Pt can continues to benefit from skilled OT services to improve functional mobility and ability to engage in ADL/IADL tasks. Based on today's session, this therapist is sceptical of a home discharge, even with supervision. Will re-assess in next session.     Follow Up Recommendations  SNF    Equipment Recommendations  3 in 1 bedside  commode    Recommendations for Other Services      Precautions / Restrictions Precautions Precautions: Fall Restrictions Weight Bearing Restrictions: No       Mobility Bed Mobility Overal bed mobility: Needs Assistance Bed Mobility: Supine to Sit Rolling: Supervision   Supine to sit: HOB elevated;Min assist Sit to supine: Min guard   General bed mobility comments: pt required increased time to perform task with HOB elevated max height; required frequent VCs to come to sit and put feet on floor  Transfers Overall transfer level: Needs assistance Equipment used: Rolling walker (2 wheeled) Transfers: Sit to/from Stand Sit to Stand: Min assist         General transfer comment: Required MinA + RW to come into standing from sit and in standing; very shaky when standing and then refused to sit    Balance Overall balance assessment: Needs assistance Sitting-balance support: Bilateral upper extremity supported;Feet supported Sitting balance-Leahy Scale: Fair Sitting balance - Comments: fair balance sitting EOB with BUE assist on bed   Standing balance support: Bilateral upper extremity supported;During functional activity Standing balance-Leahy Scale: Fair Standing balance comment: required Min A + RW for steadying; unable or unwilling to follow directions                           ADL either performed or assessed with clinical judgement   ADL Overall ADL's : Needs assistance/impaired Eating/Feeding: Moderate assistance Eating/Feeding Details (indicate cue type and reason): pt tried to pick up drink, but dropped glass; required asst to get cup to  mouth                     Toilet Transfer: Moderate assistance;Stand-pivot;BSC;RW   Toileting- Clothing Manipulation and Hygiene: Maximal assistance Toileting - Clothing Manipulation Details (indicate cue type and reason): Max A for perianal hygiene     Functional mobility during ADLs: Moderate assistance        Vision       Perception     Praxis      Cognition Arousal/Alertness: Awake/alert Behavior During Therapy: Anxious;Agitated (verbally abusive; combative)                                   General Comments: Highly agitated; unable to articulate what was going on with him -- e.g., could not describe, provide location of, or rate extent of his pain; much word-finding difficulty and subsequent frustration        Exercises Other Exercises Other Exercises: bed mobility with CGA and VCs; toileting task with BSC + RW with Min A ; Max A for pericare   Shoulder Instructions       General Comments experiencing tremors throughout UB.    Pertinent Vitals/ Pain       Pain Assessment: Faces Faces Pain Scale: Hurts even more Pain Location: abdomen, shoulder, goot Pain Descriptors / Indicators: Aching;Grimacing;Guarding;Moaning Pain Intervention(s): Limited activity within patient's tolerance;Repositioned;Monitored during session;Patient requesting pain meds-RN notified  Home Living                                          Prior Functioning/Environment              Frequency  Min 1X/week        Progress Toward Goals  OT Goals(current goals can now be found in the care plan section)  Progress towards OT goals: OT to reassess next treatment  Acute Rehab OT Goals Patient Stated Goal: have less abdominal pain OT Goal Formulation: With patient Time For Goal Achievement: 06/12/20 Potential to Achieve Goals: Good  Plan Discharge plan needs to be updated    Co-evaluation                 AM-PAC OT "6 Clicks" Daily Activity     Outcome Measure   Help from another person eating meals?: A Little Help from another person taking care of personal grooming?: A Little Help from another person toileting, which includes using toliet, bedpan, or urinal?: A Lot Help from another person bathing (including washing, rinsing, drying)?: A  Lot Help from another person to put on and taking off regular upper body clothing?: A Little Help from another person to put on and taking off regular lower body clothing?: A Lot 6 Click Score: 15    End of Session Equipment Utilized During Treatment: Gait belt;Rolling walker  OT Visit Diagnosis: Other abnormalities of gait and mobility (R26.89);Muscle weakness (generalized) (M62.81);Pain   Activity Tolerance Treatment limited secondary to agitation   Patient Left in bed;with call bell/phone within reach;with bed alarm set   Nurse Communication Other (comment) (combativeness, need for new condom cath)        Time: 1500-1540 OT Time Calculation (min): 40 min  Charges: OT General Charges $OT Visit: 1 Visit OT Treatments $Self Care/Home Management : 38-52 mins   Josiah Lobo, PhD, MS, OTR/L ascom  [phone] 06/04/20, 4:06 PM

## 2020-06-04 NOTE — Progress Notes (Addendum)
Progress Note    John [name]  ENI:[id number] DOB: 10/11/1955  DOA: 05/14/2020 PCP: Cletis Athens, MD      Brief Narrative:    Medical records reviewed and are as summarized below:  John [name] is a 64 y.o. 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     PLAN  Continue diuretics for liver cirrhosis with ascites.   Increase Lasix to 40 mg twice daily to see whether it will help with mild hyperkalemia.  Continue to monitor potassium level. Continue lactulose and rifaximin for hepatic encephalopathy Completed Rocephin for treatment of SBP.  Continue ciprofloxacin for SBP prophylaxis. No anticoagulation for portal vein thrombosis for now given significant thrombocytopenia. Outpatient follow-up with oncologist at [facility] for further management of hepatocellular carcinoma. Chest pain is likely musculoskeletal.  Continue to monitor. Analgesics as needed for pain. Awaiting placement to SNF.  Plan discussed with his brother, Chrissie Noa, over the phone. Diagnoses and prognosis were discussed. He said he was getting to much pain medicine and wanted pain medicine to be deescalated so that he can be more coherent.  He wants his brother to be coherent enough to participate in discussions regarding his healthcare power of attorney.     Diet Order            Diet 2 gram sodium Room service appropriate? Yes; Fluid consistency: Thin  Diet effective  now                    Consultants:  Oncologist  Palliative care team  Gastroenterologist  Procedures:  None    Medications:   . sodium chloride   Intravenous Once  . Avatrombopag Maleate  40 mg Oral Daily  . baclofen  10 mg Oral TID  . ciprofloxacin  500 mg Oral Daily  . collagenase   Topical Daily  . folic acid  1 mg Oral Daily  . furosemide  40 mg Oral Daily  . lactulose  20 g Oral BID  . loratadine  10 mg Oral Daily  . magnesium oxide  400 mg Oral Daily  . morphine  15 mg Oral Q12H  . pantoprazole  40 mg Oral Daily  . rifaximin  550 mg Oral BID  . sodium chloride flush  3 mL Intravenous Q12H  . spironolactone  25 mg Oral Daily   Continuous Infusions: . sodium chloride    . sodium chloride Stopped (06/04/2020 2338)     Anti-infectives (From admission, onward)   Start     Dose/Rate Route Frequency Ordered Stop   06/03/20 1000  ciprofloxacin (CIPRO) tablet 500 mg        500 mg Oral Daily 06/03/20 0748     05/29/20 1300  cefTRIAXone (ROCEPHIN) 2 g in sodium chloride 0.9 % 100 mL IVPB        2 g 200 mL/hr over 30 Minutes Intravenous  Once 05/28/20 1354 05/29/20 1323   05/27/20 1600  rifaximin (XIFAXAN) tablet 550 mg  550 mg Oral 2 times daily 05/27/20 1506     05/27/20 0900  cefTRIAXone (ROCEPHIN) 2 g in sodium chloride 0.9 % 100 mL IVPB  Status:  Discontinued        2 g 200 mL/hr over 30 Minutes Intravenous Every 24 hours 05/26/20 1149 05/28/20 1302   05/07/2020 1800  cefTRIAXone (ROCEPHIN) 2 g in sodium chloride 0.9 % 100 mL IVPB  Status:  Discontinued        2 g 200 mL/hr over 30 Minutes Intravenous Every 12 hours 06/02/2020 0936 05/26/20 1149   06/05/2020 0615  cefTRIAXone (ROCEPHIN) 2 g in sodium chloride 0.9 % 100 mL IVPB        2 g 200 mL/hr over 30 Minutes Intravenous  Once 05/15/2020 0604 06/03/2020 0736             Family Communication/Anticipated D/C date and plan/Code Status   DVT prophylaxis: Place and maintain sequential compression  device Start: 05/26/20 0823 SCDs Start: 05/09/2020 0935     Code Status: Full Code  Family Communication:  Disposition Plan:    Status is: Inpatient  Remains inpatient appropriate because:Altered mental status   Dispo:  Patient From: Home  Planned Disposition: [place]  Expected discharge date: 06/06/20 (SNF's declining (pt on chemo at [facility] will need transport))  Medically stable for discharge: Yes            Subjective:   Interval events noted.  He complains of pleuritic chest pain.  He said "my belly hurts all the time".  Patient has had intermittent confusion and agitation.  He has been refusing medications from time to time.  No shortness of breath.  Objective:    Vitals:   06/04/20 0130 06/04/20 0401 06/04/20 0755 06/04/20 1224  BP: 111/68 113/78 (!) 150/74 138/69  Pulse: 91 90 89 (!) 51  Resp: 20 19 19 19   Temp: (!) 97.5 F (36.4 C) 98.2 F (36.8 C) 97.9 F (36.6 C) 97.8 F (36.6 C)  TempSrc: Oral Oral    SpO2: 97% 98% 98% 90%  Weight:      Height:       No data found.   Intake/Output Summary (Last 24 hours) at 06/04/2020 1349 Last data filed at 06/03/2020 1742 Gross per 24 hour  Intake --  Output 1400 ml  Net -1400 ml   Filed Weights   05/30/20 0422 05/31/20 0332 06/02/20 0257  Weight: 71.5 kg 71.5 kg 71.4 kg    Exam:  GEN: NAD SKIN: No rash EYES: EOMI ENT: MMM CV: RRR PULM: CTA B ABD: soft, distended, mid abdominal tenderness, no rebound tenderness or guarding, NT, +BS CNS: AAO x 3, non focal. Left upper extremity twitching (intermittent) EXT: No edema or tenderness. MSK: Mild left upper anterior chest wall tenderness   Data Reviewed:   I have personally reviewed following labs and imaging studies:  Labs: Labs show the following:   Basic Metabolic Panel: Recent Labs  Lab 05/29/20 0441 05/29/20 0441 05/30/20 0621 05/30/20 0621 05/31/20 0529 06/04/20 0505  NA 136  --  139  --  139 137  K 4.3   < > 3.5    < > 4.3 5.3*  CL 96*  --  100  --  101 96*  CO2 29  --  29  --  28 28  GLUCOSE 105*  --  105*  --  108* 110*  BUN 32*  --  26*  --  33* 46*  CREATININE 0.74  --  0.68  --  0.84 0.95  CALCIUM 8.9  --  8.6*  --  8.5* 8.9   < > = values in this interval not displayed.   GFR Estimated Creatinine Clearance: 80.4 mL/min (by C-G formula based on SCr of 0.95 mg/dL). Liver Function Tests: Recent Labs  Lab 05/29/20 0441 05/30/20 0621 05/31/20 0529  AST 71* 71* 70*  ALT 19 21 21   ALKPHOS 87 89 87  BILITOT 3.6* 3.2* 3.3*  PROT 6.2* 6.2* 5.9*  ALBUMIN 4.1 3.6 3.5   No results for input(s): LIPASE, AMYLASE in the last 168 hours. No results for input(s): AMMONIA in the last 168 hours. Coagulation profile No results for input(s): INR, PROTIME in the last 168 hours.  CBC: Recent Labs  Lab 05/29/20 0441 05/30/20 0621 05/31/20 0529 06/03/20 0343 06/04/20 0505  WBC 3.5* 3.6* 4.6 6.4 11.4*  HGB 12.7* 12.2* 12.4* 12.6* 13.4  HCT 37.9* 37.4* 38.1* 40.0 41.8  MCV 93.3 97.1 96.0 98.0 96.5  PLT 27* 19* 16* 16* 22*   Cardiac Enzymes: No results for input(s): CKTOTAL, CKMB, CKMBINDEX, TROPONINI in the last 168 hours. BNP (last 3 results) No results for input(s): PROBNP in the last 8760 hours. CBG: No results for input(s): GLUCAP in the last 168 hours. D-Dimer: No results for input(s): DDIMER in the last 72 hours. Hgb A1c: No results for input(s): HGBA1C in the last 72 hours. Lipid Profile: No results for input(s): CHOL, HDL, LDLCALC, TRIG, CHOLHDL, LDLDIRECT in the last 72 hours. Thyroid function studies: No results for input(s): TSH, T4TOTAL, T3FREE, THYROIDAB in the last 72 hours.  Invalid input(s): FREET3 Anemia work up: No results for input(s): VITAMINB12, FOLATE, FERRITIN, TIBC, IRON, RETICCTPCT in the last 72 hours. Sepsis Labs: Recent Labs  Lab 05/30/20 0621 05/31/20 0529 06/03/20 0343 06/04/20 0505  WBC 3.6* 4.6 6.4 11.4*    Microbiology No results found for this  or any previous visit (from the past 240 hour(s)).  Procedures and diagnostic studies:  No results found.             LOS: 10 days   Obdulio Mash  [facility]   Pager on [email or web address]. If 7PM-7AM, please contact night-coverage at [email or web address]     06/04/2020, 1:49 PM

## 2020-06-04 NOTE — Progress Notes (Addendum)
Patient very agitated and cussing at [employer] and Advertising copywriter.   Refused morning medications and turning.   Unable to visualize bottom and back at this time.   MD aware

## 2020-06-04 NOTE — Progress Notes (Signed)
PT Cancellation Note  Patient Details Name: John [name] MRN: [id number] DOB: Mar 07, 1956   Cancelled Treatment:    Reason Eval/Treat Not Completed: Other (comment) Upon arrival, pt noted to be moving legs toward edge of bed. Pt with increase in full body tremors/muscle jerks observed today. Pt very agitated and cussing saying one thing then immediately saying the opposite. Pt's nurse arrived and observing same behavior. Pt able to state name, DOB, current location and when asked the current year pt stated his DOB. Pt unable to participate in therapy at this time secondary to agitation. Will attempt another date/time when pt appropriate and agreeable.   Vale Haven 06/04/2020, 1:51 PM

## 2020-06-04 NOTE — Progress Notes (Signed)
Patient more alert at lunch time, but still very agitated and refusing to be worked with.   Patient has a very noticeable twitch in upper body.   Per PT- this is new- not something she is use to seeing from him.    Paged MD

## 2020-06-05 DIAGNOSIS — K652 Spontaneous bacterial peritonitis: Secondary | ICD-10-CM | POA: Diagnosis not present

## 2020-06-05 LAB — BASIC METABOLIC PANEL
Anion gap: 9 (ref 5–15)
BUN: 41 mg/dL — ABNORMAL HIGH (ref 8–23)
CO2: 30 mmol/L (ref 22–32)
Calcium: 8.7 mg/dL — ABNORMAL LOW (ref 8.9–10.3)
Chloride: 96 mmol/L — ABNORMAL LOW (ref 98–111)
Creatinine, Ser: 0.78 mg/dL (ref 0.61–1.24)
GFR calc Af Amer: >60 mL/min (ref >60)
GFR calc non Af Amer: >60 mL/min (ref >60)
Glucose, Bld: 98 mg/dL (ref 70–99)
Potassium: 5.2 mmol/L — ABNORMAL HIGH (ref 3.5–5.1)
Sodium: 135 mmol/L (ref 135–145)

## 2020-06-05 LAB — CBC
HCT: 39 % (ref 39.0–52.0)
Hemoglobin: 12.6 g/dL — ABNORMAL LOW (ref 13.0–17.0)
MCH: 31 pg (ref 26.0–34.0)
MCHC: 32.3 g/dL (ref 30.0–36.0)
MCV: 96.1 fL (ref 80.0–100.0)
Platelets: 20 10*3/uL — CL (ref 150–400)
RBC: 4.06 MIL/uL — ABNORMAL LOW (ref 4.22–5.81)
RDW: 19.9 % — ABNORMAL HIGH (ref 11.5–15.5)
WBC: 15.6 10*3/uL — ABNORMAL HIGH (ref 4.0–10.5)
nRBC: 0 % (ref 0.0–0.2)

## 2020-06-05 LAB — AMMONIA: Ammonia: 36 umol/L — ABNORMAL HIGH (ref 9–35)

## 2020-06-05 NOTE — Progress Notes (Signed)
Physical Therapy Treatment Patient Details Name: John [name] MRN: [id number] DOB: 05-Sep-1956 Today's Date: 06/05/2020    History of Present Illness 64 y.o. 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 Pt reported needing to have a BM. Pt able to perform bed mobility with supervision with external assistance for catheter line management only. Pt requires increased time to come to sitting to EOB with HOB elevated and utilized bed rail to assist with trunk elevation. Pt performed sit <> stands with CGA to RW from chair level surfaces. Verbal cues for hand placement during transfers. Pt ambulated 4 feet total to recliner chair then back to bed as pt unable to get comfortable in recliner despite increased attempts and refusing to tolerate sitting up. Pt remained irritable and easily agitated throughout session and had difficulty finding words occasionally, which increased agitation. Pt spent nearly entirety of session complaining about a variety of things including external catheter hooked up to suction and the sounds that it makes keeping him awake at night, uncomfortable chair and bed, and venting in window. Attempted to redirect pt to focus on session with poor success as pt continues to perseverate on aforementioned topics. Will continue to follow pt and progress as tolerated.     Follow Up Recommendations  SNF;Supervision - Intermittent     Equipment Recommendations  None recommended by PT    Recommendations for Other Services       Precautions / Restrictions Precautions Precautions:  Fall Restrictions Weight Bearing Restrictions: No    Mobility  Bed Mobility Overal bed mobility: Needs Assistance Bed Mobility: Supine to Sit;Sit to Supine     Supine to sit: HOB elevated;Supervision Sit to supine: HOB elevated;Supervision   General bed mobility comments: increased time to perform supine <> sit with use of bedrails; pt requests assistance however is able to perform without external physical assist  Transfers Overall transfer level: Needs assistance Equipment used: Rolling walker (2 wheeled) Transfers: Sit to/from Stand Sit to Stand: Min guard         General transfer comment: CGA for sit <> stand transfers from bed, BSC, and chair for boosting hips to stand, balance while upright, and eccentric control on descent; verbal cues for hand placement   Ambulation/Gait Ambulation/Gait assistance: Min guard Gait Distance (Feet): 4 Feet Assistive device: Rolling walker (2 wheeled) Gait Pattern/deviations: Decreased step length - right;Decreased step length - left Gait velocity: decreased   General Gait Details: reciprocal pattern noted with decreased step lengths bilat.; CGA for safety and steadying   Stairs             Wheelchair Mobility    Modified Rankin (Stroke Patients Only)       Balance Overall balance assessment: Needs assistance Sitting-balance support: Single extremity supported;Feet supported Sitting balance-Leahy Scale: Fair Sitting balance - Comments: static sitting balance fair to good with unsupported back   Standing balance support: Bilateral upper extremity supported;During functional activity Standing balance-Leahy Scale: Fair Standing balance comment: CGA for static and dynamic standing balance with BUE on RW  Cognition Arousal/Alertness: Awake/alert Behavior During Therapy: Agitated;WFL for tasks assessed/performed Overall Cognitive Status: Within Functional Limits for tasks assessed                                  General Comments: Pt easily agitated and very irritable throughout session.      Exercises Other Exercises Other Exercises: additional time taken for pt to have BM on BSC; required total A for pericare with pt leaning forward on RW with CGA for steadying    General Comments        Pertinent Vitals/Pain Pain Assessment: No/denies pain    Home Living                      Prior Function            PT Goals (current goals can now be found in the care plan section) Acute Rehab PT Goals Patient Stated Goal: to get comfortable PT Goal Formulation: With patient Time For Goal Achievement: 06/13/20 Potential to Achieve Goals: Fair Progress towards PT goals: Progressing toward goals    Frequency    Min 2X/week      PT Plan Current plan remains appropriate    Co-evaluation              AM-PAC PT "6 Clicks" Mobility   Outcome Measure  Help needed turning from your back to your side while in a flat bed without using bedrails?: A Little Help needed moving from lying on your back to sitting on the side of a flat bed without using bedrails?: A Lot Help needed moving to and from a bed to a chair (including a wheelchair)?: A Little Help needed standing up from a chair using your arms (e.g., wheelchair or bedside chair)?: A Little Help needed to walk in hospital room?: A Little Help needed climbing 3-5 steps with a railing? : A Lot 6 Click Score: 16    End of Session Equipment Utilized During Treatment: Gait belt Activity Tolerance: Patient limited by fatigue Patient left: in bed;with call bell/phone within reach;with bed alarm set Nurse Communication: Mobility status PT Visit Diagnosis: Unsteadiness on feet (R26.81);Other abnormalities of gait and mobility (R26.89);Muscle weakness (generalized) (M62.81);Pain     Time: 7416-3845 PT Time Calculation (min) (ACUTE ONLY): 44 min  Charges:                         Vale Haven, SPT   Lum Stillinger 06/05/2020, 1:05 PM

## 2020-06-05 NOTE — Care Management Important Message (Signed)
Important Message  Patient Details  Name: John [name] MRN: [id number] Date of Birth: 1956-03-08   Medicare Important Message Given:  Yes     Dannette Barbara 06/05/2020, 1:26 PM

## 2020-06-05 NOTE — TOC Progression Note (Signed)
Transition of Care [facility]) - Progression Note    Patient Details  Name: VIRAL SCHRAMM MRN: [id number] Date of Birth: 20-Aug-1956  Transition of Care [facility]) CM/SW [place], LCSW Phone Number: 06/05/2020, 11:23 AM  Clinical Narrative: PASARR under manual review. 30 day note on front of chart for MD to sign. Sent secure chat to MD to notify.    Expected Discharge Plan: [place] Barriers to Discharge: No SNF bed, Insurance Authorization  Expected Discharge Plan and Services Expected Discharge Plan: [place] In-house Referral: Clinical Social Work   Post Acute Care Choice: [place] Living arrangements for the past 2 months: Single Family Home                                       Social Determinants of Health (SDOH) Interventions    Readmission Risk Interventions No flowsheet data found.

## 2020-06-05 NOTE — Progress Notes (Signed)
Patient's Pleurx catheter drained at 1615. 1169mL was removed. New dressing applied. Patient tolerated well. Will continue to monitor.

## 2020-06-05 NOTE — TOC Initial Note (Signed)
Transition of Care [facility]) - Initial/Assessment Note    Patient Details  Name: John [name] MRN: [id number] Date of Birth: March 23, 1956  Transition of Care [facility]) CM/SW Contact:    Eileen Stanford, LCSW Phone Number: 06/05/2020, 10:40 AM  Clinical Narrative:    Pt is only alert to self and place. CSW spoke with pt's son via telephone. Pt's son is agreeable to SNF. Pt's son would like pt to go to a SNF in [place]. Pt gets Chemo treatments at [facility] every three weeks. Pt also has a plurex drain. CSW anticipates both of these will be a barrier to placement. Pt has expanded bed search. Pt's son states the physical state pt is in he would not feel comfortable transporting pt to treatments from SNF.  [facility] [place] and [facility] are reviewing pt's chart.               Expected Discharge Plan: Skilled Nursing Facility Barriers to Discharge: No SNF bed, Insurance Authorization   Patient Goals and CMS Choice     Choice offered to / list presented to : Adult Children  Expected Discharge Plan and Services Expected Discharge Plan: [place] In-house Referral: Clinical Social Work   Post Acute Care Choice: [place] Living arrangements for the past 2 months: [place]                                      Prior Living Arrangements/Services Living arrangements for the past 2 months: Single Family Home Lives with:: Self Patient language and need for interpreter reviewed:: Yes Do you feel safe going back to the place where you live?: Yes      Need for Family Participation in Patient Care: Yes (Comment) Care giver support system in place?: Yes (comment)   Criminal Activity/Legal Involvement Pertinent to Current Situation/Hospitalization: No - Comment as needed  Activities of Daily Living Home Assistive Devices/Equipment: Blood pressure cuff, Cane (specify quad or straight) ADL Screening (condition at time of  admission) Patient's cognitive ability adequate to safely complete daily activities?: Yes Is the patient deaf or have difficulty hearing?: No Does the patient have difficulty seeing, even when wearing glasses/contacts?: No Does the patient have difficulty concentrating, remembering, or making decisions?: No Patient able to express need for assistance with ADLs?: Yes Does the patient have difficulty dressing or bathing?: No Independently performs ADLs?: Yes (appropriate for developmental age) Does the patient have difficulty walking or climbing stairs?: No Weakness of Legs: None Weakness of Arms/Hands: None  Permission Sought/Granted Permission sought to share information with : Family Supports    Share Information with NAME: Chrissie Noa     Permission granted to share info w Relationship: son     Emotional Assessment Appearance:: Appears stated age Attitude/Demeanor/Rapport: Unable to Assess Affect (typically observed): Unable to Assess Orientation: : Oriented to Self, Oriented to Place Alcohol / Substance Use: Not Applicable Psych Involvement: No (comment)  Admission diagnosis:  Portal vein thrombosis [I81] Hepatocellular carcinoma (HCC) [C22.0] Hyponatremia [E87.1] Thrombocytopenia (HCC) [D69.6] SBP (spontaneous bacterial peritonitis) ([place]) [K65.2] AKI (acute kidney injury) ([place]) [N17.9] Patient Active Problem List   Diagnosis Date Noted  . Pressure injury of skin 05/29/2020  . SBP (spontaneous bacterial peritonitis) ([place]) 05/18/2020  . Portal vein thrombosis 05/12/2020  . AKI (acute kidney injury) ([place]) 05/24/2020  . Pharmacologic therapy 02/27/2020  . Disorder of skeletal system 02/27/2020  .  Problems influencing health status 02/27/2020  . Fever and chills 11/02/2019  . Epistaxis 11/02/2019  . 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 Cancer associated pain 01/04/2019  . Chronic  abdominal pain, right upper quadrant (RUQ) 10/13/2018  . Cholangiocarcinoma ([place]) 07/07/2018  . Liver mass, right lobe 04/04/2018  . Goals of care, counseling/discussion 12/03/2017  . Nodule of upper lobe of left lung 12/02/2017  . History of brain tumor 07/07/2017  . De Quervain's tenosynovitis, left 07/07/2017  . Chronic thumb pain (Left) 06/30/2017  . Chronic pain syndrome 10/05/2016  . Neurogenic pain 01/21/2016  . Musculoskeletal pain 01/21/2016  . Chronic lower extremity pain (Secondary area of Pain) (Bilateral) (L>R) 01/21/2016  . Chronic upper extremity pain (Fourth area of Pain) (Bilateral) (R>L) 01/21/2016  . Abnormal MRI, lumbar spine (2015) 01/21/2016  . Lumbar central spinal stenosis (Severe at L4-5; L5-S1) 01/21/2016  . Lumbar lateral recess stenosis (Bilateral L>R at L4-5; Left sided at L5-S1) 01/21/2016  . Lumbar foraminal stenosis (Left L5-S1) 01/21/2016  . Lumbar facet hypertrophy (L4-5 & L5-S1 L>R) 01/21/2016  . Abnormal MRI, cervical spine (2014) 01/21/2016  . Cervical foraminal stenosis (Right sided C3-4 & C4-5) (Left-sided C5-6 and C7-T1) (Bilateral C6-7) 01/21/2016  . Personal history of tobacco use, presenting hazards to health 01/09/2016  . Opioid-induced constipation (OIC) 11/18/2015  . Long term prescription opiate use 09/22/2015  . Encounter for chronic pain management 09/22/2015  . Radicular pain of shoulder (Bilateral) (R>L) 09/22/2015  . Encounter for therapeutic drug level monitoring 06/23/2015  . Long term current use of opiate analgesic 06/23/2015  . Opiate use (7.5 MME/Day) 06/23/2015  . Lumbar spondylosis 06/23/2015  . Chronic low back pain (Primary Area of Pain) (Bilateral) (L>R) 06/23/2015  . IVP/radiological dye Allergy 06/23/2015    Class: Chronic  . Thrombocytopenia ([place]) 06/23/2015  . History of alcoholism 06/23/2015  . Depression 06/23/2015  . Dystonia 06/23/2015  . Gastric ulcer 06/23/2015  . HCV (hepatitis C virus) 06/23/2015  . H/O  neoplasm 06/23/2015  . BP (high blood pressure) 06/23/2015  . Decreased leukocytes 06/23/2015  . Hepatic cirrhosis ([place]) 06/23/2015  . Current tobacco use 06/23/2015  . Has a tremor 06/23/2015  . Osteoarthritis of spine with radiculopathy, lumbosacral region 06/23/2015  . Peripheral neuropathy ([place]) 89/38/1017  . Chronic neck pain (Third area of Pain) (Bilateral) (R>L) 06/23/2015  . Cervical spondylosis 06/23/2015  . Obstructive sleep apnea 06/23/2015  . History of panic attacks 06/23/2015  . GERD (gastroesophageal reflux disease) 06/23/2015  . Chronic constipation 06/23/2015  . 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[place]) 06/23/2015  . Leukopenia 06/23/2015    Class: History of  . Lumbar facet syndrome (Bilateral) (L>R) 06/23/2015  . Chronic lumbar radicular pain (Right S1 and Left L5) (Bilateral) (L>R) 06/23/2015  . Cervical radicular pain (Bilateral) (R>L) 06/23/2015  . Chronic obstructive pulmonary disease (COPD) ([place]) 06/23/2015  . Head injury 06/23/2015    Class: History of  . Epileptic disorder ([place]) 06/23/2015  . Carpal tunnel syndrome 01/07/2015  . Bilateral carpal tunnel syndrome 01/07/2015  . Foot pain, right 09/11/2014  . Difficulty in walking 05/06/2014  . Loss of feeling or sensation 05/06/2014  . Absence of sensation 05/06/2014  . Back pain 05/06/2014  . Splenomegaly 05/30/2013   PCP:  Cletis Athens, MD Pharmacy:   [id number] - [place], [address] [place] [address] Phone: [phone] Fax: [facility]  [place], [address] [place] [address] Phone: [phone] Fax: [phone]  [place], [address], [address] [place], [address] [place] [address] Phone: [phone] Fax: [phone]     Social Determinants of Health (SDOH) Interventions     Readmission Risk Interventions No flowsheet data found.

## 2020-06-05 NOTE — Progress Notes (Signed)
Progress Note    AUBURN HERT  ZOX:[id number] DOB: Nov 04, 1955  DOA: 05/08/2020 PCP: Cletis Athens, MD      Brief Narrative:    Medical records reviewed and are as summarized below:  John [name] is a 64 y.o. 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     PLAN  Continue Lasix for liver cirrhosis with ascites. Hold Aldactone today because of mild hyperkalemia. Monitor potassium level. Continue lactulose and rifaximin for hepatic encephalopathy. Completed Rocephin for treatment of SBP on 05/29/2020.  Continue ciprofloxacin for SBP prophylaxis. No anticoagulation for portal vein thrombosis for now given significant thrombocytopenia. Outpatient follow-up with oncologist at [facility] for further management of hepatocellular carcinoma. Analgesics as needed for pain. Diagnosis and prognosis was discussed with the patient.  He remains a full code for now.  Recommended that he completes form for advance directive / healthcare power of attorney.   Diet Order            Diet 2 gram sodium Room service appropriate? Yes; Fluid consistency: Thin  Diet effective now                    Consultants:  Oncologist  Palliative care team  Gastroenterologist  Procedures:  None    Medications:    sodium chloride   Intravenous Once   Avatrombopag Maleate  40 mg Oral Daily   baclofen  10 mg Oral TID   ciprofloxacin  500 mg Oral  Daily   collagenase   Topical Daily   folic acid  1 mg Oral Daily   furosemide  40 mg Oral BID   lactulose  20 g Oral BID   loratadine  10 mg Oral Daily   magnesium oxide  400 mg Oral Daily   morphine  15 mg Oral Q12H   pantoprazole  40 mg Oral Daily   rifaximin  550 mg Oral BID   sodium chloride flush  3 mL Intravenous Q12H   spironolactone  25 mg Oral Daily   Continuous Infusions:  sodium chloride     sodium chloride Stopped (05/24/2020 2338)     Anti-infectives (From admission, onward)   Start     Dose/Rate Route Frequency Ordered Stop   06/03/20 1000  ciprofloxacin (CIPRO) tablet 500 mg        500 mg Oral Daily 06/03/20 0748     05/29/20 1300  cefTRIAXone (ROCEPHIN) 2 g in sodium chloride 0.9 % 100 mL IVPB        2 g 200 mL/hr over 30 Minutes Intravenous  Once 05/28/20 1354 05/29/20 1323   05/27/20 1600  rifaximin (XIFAXAN) tablet 550 mg        550 mg Oral 2 times daily 05/27/20 1506     05/27/20 0900  cefTRIAXone (ROCEPHIN) 2 g in sodium chloride 0.9 % 100 mL IVPB  Status:  Discontinued        2 g 200 mL/hr over 30 Minutes Intravenous Every 24  hours 05/26/20 1149 05/28/20 1302   05/15/2020 1800  cefTRIAXone (ROCEPHIN) 2 g in sodium chloride 0.9 % 100 mL IVPB  Status:  Discontinued        2 g 200 mL/hr over 30 Minutes Intravenous Every 12 hours 05/20/2020 0936 05/26/20 1149   05/16/2020 0615  cefTRIAXone (ROCEPHIN) 2 g in sodium chloride 0.9 % 100 mL IVPB        2 g 200 mL/hr over 30 Minutes Intravenous  Once 06/01/2020 0604 05/13/2020 0736             Family Communication/Anticipated D/C date and plan/Code Status   DVT prophylaxis: Place and maintain sequential compression device Start: 05/26/20 0823 SCDs Start: 05/09/2020 0935     Code Status: Full Code  Family Communication:  Disposition Plan:    Status is: Inpatient  Remains inpatient appropriate because:Unsafe d/c plan   Dispo:  Patient From: Home  Planned Disposition: [place]  Expected discharge date: 06/06/20 (SNF's declining (pt on chemo at [facility] will need transport))  Medically stable for discharge: Yes            Subjective:   No complaints.  He feels better today.  Objective:    Vitals:   06/04/20 1224 06/04/20 1706 06/04/20 1951 06/05/20 0353  BP: 138/69 (!) 128/58 135/63 (!) 105/49  Pulse: (!) 51 81 83 74  Resp: 19 19 16 18   Temp: 97.8 F (36.6 C) 98 F (36.7 C) 97.7 F (36.5 C) (!) 97.3 F (36.3 C)  TempSrc:   Oral Oral  SpO2: 90%  100% 100%  Weight:      Height:       No data found.   Intake/Output Summary (Last 24 hours) at 06/05/2020 1353 Last data filed at 06/05/2020 0600 Gross per 24 hour  Intake 240 ml  Output 500 ml  Net -260 ml   Filed Weights   05/30/20 0422 05/31/20 0332 06/02/20 0257  Weight: 71.5 kg 71.5 kg 71.4 kg    Exam:  GEN: NAD SKIN: Right lower quadrant superficial wound EYES: EOMI ENT: MMM CV: RRR PULM: CTA B ABD: soft, distended, mild tenderness, +BS CNS: AAO x 3, non focal, flapping tremors of bilateral hands EXT: No edema or tenderness    Data Reviewed:   I have personally reviewed following labs and imaging studies:  Labs: Labs show the following:   Basic Metabolic Panel: Recent Labs  Lab 05/30/20 0621 05/30/20 0621 05/31/20 0529 05/31/20 0529 06/04/20 0505 06/05/20 0536  NA 139  --  139  --  137 135  K 3.5   < > 4.3   < > 5.3* 5.2*  CL 100  --  101  --  96* 96*  CO2 29  --  28  --  28 30  GLUCOSE 105*  --  108*  --  110* 98  BUN 26*  --  33*  --  46* 41*  CREATININE 0.68  --  0.84  --  0.95 0.78  CALCIUM 8.6*  --  8.5*  --  8.9 8.7*   < > = values in this interval not displayed.   GFR Estimated Creatinine Clearance: 95.4 mL/min (by C-G formula based on SCr of 0.78 mg/dL). Liver Function Tests: Recent Labs  Lab 05/30/20 0621 05/31/20 0529  AST 71* 70*  ALT 21 21  ALKPHOS 89 87  BILITOT 3.2* 3.3*  PROT 6.2* 5.9*  ALBUMIN 3.6 3.5   No results for  input(s): LIPASE, AMYLASE in  the last 168 hours. Recent Labs  Lab 06/05/20 0536  AMMONIA 36*   Coagulation profile No results for input(s): INR, PROTIME in the last 168 hours.  CBC: Recent Labs  Lab 05/30/20 0621 05/31/20 0529 06/03/20 0343 06/04/20 0505 06/05/20 0536  WBC 3.6* 4.6 6.4 11.4* 15.6*  HGB 12.2* 12.4* 12.6* 13.4 12.6*  HCT 37.4* 38.1* 40.0 41.8 39.0  MCV 97.1 96.0 98.0 96.5 96.1  PLT 19* 16* 16* 22* 20*   Cardiac Enzymes: No results for input(s): CKTOTAL, CKMB, CKMBINDEX, TROPONINI in the last 168 hours. BNP (last 3 results) No results for input(s): PROBNP in the last 8760 hours. CBG: No results for input(s): GLUCAP in the last 168 hours. D-Dimer: No results for input(s): DDIMER in the last 72 hours. Hgb A1c: No results for input(s): HGBA1C in the last 72 hours. Lipid Profile: No results for input(s): CHOL, HDL, LDLCALC, TRIG, CHOLHDL, LDLDIRECT in the last 72 hours. Thyroid function studies: No results for input(s): TSH, T4TOTAL, T3FREE, THYROIDAB in the last 72 hours.  Invalid input(s): FREET3 Anemia work up: No results for input(s): VITAMINB12, FOLATE, FERRITIN, TIBC, IRON, RETICCTPCT in the last 72 hours. Sepsis Labs: Recent Labs  Lab 05/31/20 0529 06/03/20 0343 06/04/20 0505 06/05/20 0536  WBC 4.6 6.4 11.4* 15.6*    Microbiology No results found for this or any previous visit (from the past 240 hour(s)).  Procedures and diagnostic studies:  No results found.             LOS: 11 days   [place] Copywriter, advertising on [email or web address]. If 7PM-7AM, please contact night-coverage at [email or web address]     06/05/2020, 1:53 PM

## 2020-06-06 DIAGNOSIS — K652 Spontaneous bacterial peritonitis: Secondary | ICD-10-CM | POA: Diagnosis not present

## 2020-06-06 DIAGNOSIS — D696 Thrombocytopenia, unspecified: Secondary | ICD-10-CM | POA: Diagnosis not present

## 2020-06-06 DIAGNOSIS — C221 Intrahepatic bile duct carcinoma: Secondary | ICD-10-CM | POA: Diagnosis not present

## 2020-06-06 LAB — BASIC METABOLIC PANEL
Anion gap: 9 (ref 5–15)
BUN: 40 mg/dL — ABNORMAL HIGH (ref 8–23)
CO2: 27 mmol/L (ref 22–32)
Calcium: 8.3 mg/dL — ABNORMAL LOW (ref 8.9–10.3)
Chloride: 96 mmol/L — ABNORMAL LOW (ref 98–111)
Creatinine, Ser: 0.95 mg/dL (ref 0.61–1.24)
GFR calc Af Amer: >60 mL/min (ref >60)
GFR calc non Af Amer: >60 mL/min (ref >60)
Glucose, Bld: 122 mg/dL — ABNORMAL HIGH (ref 70–99)
Potassium: 5.1 mmol/L (ref 3.5–5.1)
Sodium: 132 mmol/L — ABNORMAL LOW (ref 135–145)

## 2020-06-06 LAB — CBC
HCT: 36.8 % — ABNORMAL LOW (ref 39.0–52.0)
Hemoglobin: 11.7 g/dL — ABNORMAL LOW (ref 13.0–17.0)
MCH: 31 pg (ref 26.0–34.0)
MCHC: 31.8 g/dL (ref 30.0–36.0)
MCV: 97.4 fL (ref 80.0–100.0)
Platelets: 18 10*3/uL — CL (ref 150–400)
RBC: 3.78 MIL/uL — ABNORMAL LOW (ref 4.22–5.81)
RDW: 20.1 % — ABNORMAL HIGH (ref 11.5–15.5)
WBC: 8.7 10*3/uL (ref 4.0–10.5)
nRBC: 0 % (ref 0.0–0.2)

## 2020-06-06 NOTE — Progress Notes (Addendum)
Progress Note    John [name]  XKG:[id number] DOB: [birth year]  DOA: 06/05/2020 PCP: Cletis Athens, MD      Brief Narrative:    Medical records reviewed and are as summarized below:  John [name] is a 64 y.o. 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     PLAN  Continue Lasix for liver cirrhosis with ascites Consider resuming Aldactone tomorrow if potassium remains okay. Continue lactulose and rifaximin for hepatic encephalopathy. Completed Rocephin for treatment of SBP on 05/29/2020.  Continue ciprofloxacin for SBP prophylaxis. No anticoagulation for portal vein thrombosis for now given significant thrombocytopenia. Outpatient follow-up with oncologist at [facility] for further management of hepatocellular carcinoma. Analgesics as needed for pain. Monitor BMP and CBC Awaiting placement to SNF   Diet Order            Diet 2 gram sodium Room service appropriate? Yes; Fluid consistency: Thin  Diet effective now                    Consultants:  Oncologist  Palliative care team  Gastroenterologist  Procedures:  None    Medications:   . sodium chloride   Intravenous Once  . Avatrombopag Maleate  40 mg Oral Daily  . baclofen  10 mg Oral TID  . ciprofloxacin  500 mg Oral Daily  . collagenase   Topical Daily  . folic acid  1 mg Oral Daily  . furosemide  40 mg Oral BID  . lactulose  20 g Oral BID    . loratadine  10 mg Oral Daily  . magnesium oxide  400 mg Oral Daily  . morphine  15 mg Oral Q12H  . pantoprazole  40 mg Oral Daily  . rifaximin  550 mg Oral BID  . sodium chloride flush  3 mL Intravenous Q12H  . spironolactone  25 mg Oral Daily   Continuous Infusions: . sodium chloride    . sodium chloride Stopped (05/07/2020 2338)     Anti-infectives (From admission, onward)   Start     Dose/Rate Route Frequency Ordered Stop   06/03/20 1000  ciprofloxacin (CIPRO) tablet 500 mg        500 mg Oral Daily 06/03/20 0748     05/29/20 1300  cefTRIAXone (ROCEPHIN) 2 g in sodium chloride 0.9 % 100 mL IVPB        2 g 200 mL/hr over 30 Minutes Intravenous  Once 05/28/20 1354 05/29/20 1323   05/27/20 1600  rifaximin (XIFAXAN) tablet 550 mg        550 mg Oral 2 times daily 05/27/20 1506     05/27/20 0900  cefTRIAXone (ROCEPHIN) 2 g in sodium chloride 0.9 % 100 mL IVPB  Status:  Discontinued        2 g 200 mL/hr over 30 Minutes Intravenous Every 24 hours 05/26/20 1149 05/28/20 1302   05/29/2020 1800  cefTRIAXone (ROCEPHIN) 2 g in sodium chloride 0.9 % 100 mL IVPB  Status:  Discontinued        2 g 200 mL/hr over 30 Minutes Intravenous Every 12 hours 05/15/2020 0936 05/26/20 1149   05/22/2020 0615  cefTRIAXone (ROCEPHIN) 2 g in sodium chloride 0.9 % 100 mL IVPB        2 g 200 mL/hr over 30 Minutes Intravenous  Once 06/03/2020 0604 05/15/2020 0736             Family Communication/Anticipated D/C date and plan/Code Status   DVT prophylaxis: Place and maintain sequential compression device Start: 05/26/20 0823 SCDs Start: 06/05/2020 0935     Code Status: Full Code  Family Communication:  Disposition Plan:    Status is: Inpatient  Remains inpatient appropriate because:Unsafe d/c plan   Dispo:  Patient From: Home  Planned Disposition: [place]  Expected discharge date: 06/07/20 (SNF's declining (pt on chemo at [facility] will need transport))  Medically stable for discharge:  Yes            Subjective:   Interval events noted.  No new complaints.  Abdominal pain is better.  Objective:    Vitals:   06/06/20 0339 06/06/20 0844 06/06/20 1245 06/06/20 1612  BP: (!) 119/52 (!) 102/91 114/68 113/67  Pulse: (!) 103 95 90 92  Resp: 18 18 16 16   Temp: 97.9 F (36.6 C) 98.6 F (37 C) 98.6 F (37 C) 98.1 F (36.7 C)  TempSrc: Oral Oral Oral Oral  SpO2: 91% 91% 90% 90%  Weight:      Height:       No data found.   Intake/Output Summary (Last 24 hours) at 06/06/2020 1639 Last data filed at 06/06/2020 0900 Gross per 24 hour  Intake 720 ml  Output 500 ml  Net 220 ml   Filed Weights   05/30/20 0422 05/31/20 0332 06/02/20 0257  Weight: 71.5 kg 71.5 kg 71.4 kg    Exam:  GEN: NAD SKIN: Right lower quadrant superficial abdominal wound EYES: EOMI, no pallor ENT: MMM CV: RRR PULM: CTA B ABD: soft, distended, mild mid abdominal tenderness, +BS, + right lower quadrant drain in place CNS: AAO x 3, non focal EXT: No edema or tenderness     Data Reviewed:   I have personally reviewed following labs and imaging studies:  Labs: Labs show the following:   Basic Metabolic Panel: Recent Labs  Lab 05/31/20 0529 05/31/20 0529 06/04/20 0505 06/04/20 0505 06/05/20 0536 06/06/20 1431  NA 139  --  137  --  135 132*  K 4.3   < > 5.3*   < > 5.2* 5.1  CL 101  --  96*  --  96* 96*  CO2 28  --  28  --  30 27  GLUCOSE 108*  --  110*  --  98 122*  BUN 33*  --  46*  --  41* 40*  CREATININE 0.84  --  0.95  --  0.78 0.95  CALCIUM 8.5*  --  8.9  --  8.7* 8.3*   < > = values in this interval not displayed.   GFR Estimated Creatinine Clearance: 80.4 mL/min (by C-G formula based on SCr of 0.95 mg/dL). Liver Function Tests: Recent Labs  Lab 05/31/20 0529  AST 70*  ALT 21  ALKPHOS 87  BILITOT 3.3*  PROT 5.9*  ALBUMIN 3.5   No results for input(s): LIPASE, AMYLASE in the last 168 hours. Recent Labs  Lab 06/05/20 0536  AMMONIA 36*    Coagulation profile No results  for input(s): INR, PROTIME in the last 168 hours.  CBC: Recent Labs  Lab 05/31/20 0529 06/03/20 0343 06/04/20 0505 06/05/20 0536 06/06/20 0458  WBC 4.6 6.4 11.4* 15.6* 8.7  HGB 12.4* 12.6* 13.4 12.6* 11.7*  HCT 38.1* 40.0 41.8 39.0 36.8*  MCV 96.0 98.0 96.5 96.1 97.4  PLT 16* 16* 22* 20* 18*   Cardiac Enzymes: No results for input(s): CKTOTAL, CKMB, CKMBINDEX, TROPONINI in the last 168 hours. BNP (last 3 results) No results for input(s): PROBNP in the last 8760 hours. CBG: No results for input(s): GLUCAP in the last 168 hours. D-Dimer: No results for input(s): DDIMER in the last 72 hours. Hgb A1c: No results for input(s): HGBA1C in the last 72 hours. Lipid Profile: No results for input(s): CHOL, HDL, LDLCALC, TRIG, CHOLHDL, LDLDIRECT in the last 72 hours. Thyroid function studies: No results for input(s): TSH, T4TOTAL, T3FREE, THYROIDAB in the last 72 hours.  Invalid input(s): FREET3 Anemia work up: No results for input(s): VITAMINB12, FOLATE, FERRITIN, TIBC, IRON, RETICCTPCT in the last 72 hours. Sepsis Labs: Recent Labs  Lab 06/03/20 0343 06/04/20 0505 06/05/20 0536 06/06/20 0458  WBC 6.4 11.4* 15.6* 8.7    Microbiology No results found for this or any previous visit (from the past 240 hour(s)).  Procedures and diagnostic studies:  No results found.             LOS: 12 days   [place] Copywriter, advertising on [email or web address]. If 7PM-7AM, please contact night-coverage at [email or web address]     06/06/2020, 4:39 PM

## 2020-06-06 NOTE — Progress Notes (Signed)
Physical Therapy Treatment Patient Details Name: John [name] MRN: [id number] DOB: July 05, 1956 Today's Date: 06/06/2020    History of Present Illness 64 y.o. male with medical history significant for hepatocellular carcinoma and cholangiocarcinoma on bevacizumab/atezolizumab complicated by recurrent ascites status post peritoneal catheter which was placed on 09/01, history of chronic alcoholic pancreatitis, hepatitis B, COPD, seizure disorder who presented to the ED via EMS for evaluation of worsening abdominal pain    PT Comments    Pt continues to be weak, guarded and quick to fatigue but was able to do considerably more ambulation/mobility this session than any previous ones.  Pt initially not wanting to do anything, but with some reasoning was willing to try a little and though slow and guarded with walker reliant ambulation he did do relatively well.   Follow Up Recommendations  SNF;Supervision - Intermittent     Equipment Recommendations  None recommended by PT    Recommendations for Other Services       Precautions / Restrictions Precautions Precautions: Fall Restrictions Weight Bearing Restrictions: No    Mobility  Bed Mobility Overal bed mobility: Needs Assistance Bed Mobility: Supine to Sit     Supine to sit: Supervision     General bed mobility comments: increased time to perform supine > sit with use of bedrails  Transfers Overall transfer level: Needs assistance Equipment used: Rolling walker (2 wheeled) Transfers: Sit to/from Stand Sit to Stand: Min guard         General transfer comment: 2 times sit to stand during session, able to ris w/o direct assist, guarded with the effort  Ambulation/Gait Ambulation/Gait assistance: Min guard Gait Distance (Feet): 60 Feet Assistive device: Rolling walker (2 wheeled)       General Gait Details: slow but relatively steady with ambulation to/from door x2, standing rest break at 30 ft as O2 dropped to 86%,  able to increase back to 90s with focused PL breathing.  Reliant on walker with no assist or unsteadiness   Stairs             Wheelchair Mobility    Modified Rankin (Stroke Patients Only)       Balance Overall balance assessment: Needs assistance Sitting-balance support: Single extremity supported;Feet supported Sitting balance-Leahy Scale: Good     Standing balance support: Bilateral upper extremity supported;During functional activity Standing balance-Leahy Scale: Fair Standing balance comment: CGA for static and dynamic standing balance with BUE on RW                            Cognition Arousal/Alertness: Awake/alert Behavior During Therapy: WFL for tasks assessed/performed Overall Cognitive Status: Within Functional Limits for tasks assessed                                        Exercises      General Comments        Pertinent Vitals/Pain Pain Assessment: 0-10 Pain Score: 4  Pain Location: R upper abdomen    Home Living                      Prior Function            PT Goals (current goals can now be found in the care plan section) Progress towards PT goals: Progressing toward goals    Frequency    Min  2X/week      PT Plan Current plan remains appropriate    Co-evaluation              AM-PAC PT "6 Clicks" Mobility   Outcome Measure  Help needed turning from your back to your side while in a flat bed without using bedrails?: A Little Help needed moving from lying on your back to sitting on the side of a flat bed without using bedrails?: A Lot Help needed moving to and from a bed to a chair (including a wheelchair)?: A Little Help needed standing up from a chair using your arms (e.g., wheelchair or bedside chair)?: A Little Help needed to walk in hospital room?: A Little Help needed climbing 3-5 steps with a railing? : A Lot 6 Click Score: 16    End of Session Equipment Utilized During  Treatment: Gait belt Activity Tolerance: Patient limited by fatigue Patient left: with call bell/phone within reach;with bed alarm set (seated at EOB) Nurse Communication: Mobility status PT Visit Diagnosis: Unsteadiness on feet (R26.81);Other abnormalities of gait and mobility (R26.89);Muscle weakness (generalized) (M62.81);Pain     Time: 1859-0931 PT Time Calculation (min) (ACUTE ONLY): 24 min  Charges:  $Gait Training: 8-22 mins $Therapeutic Activity: 8-22 mins                     Kreg Shropshire, DPT 06/06/2020, 5:47 PM

## 2020-06-06 NOTE — TOC Progression Note (Addendum)
Transition of Care [facility]) - Progression Note    Patient Details  Name: MATTHEU BRODERSEN MRN: [id number] Date of Birth: Jan 08, 1956  [facility] [place] CM/SW Contact  Shade Flood, LCSW Phone Number: 06/06/2020, 4:25 PM  Clinical Narrative:     TOC following. Still working on SNF placement. Did receive PASRR number today. That number is [id number] and it expires 07/06/20.  Spoke with Kenney Houseman at [facility] and she states that they can accept pt but he would have to have his own transportation if he is going to get his chemo treatments at [facility] while he is in rehab. Bed offers still pending with SNF facilities closer to [facility]. TOC will continue to follow for dc arrangements.  Expected Discharge Plan: [place] Barriers to Discharge: No SNF bed, Insurance Authorization  Expected Discharge Plan and Services Expected Discharge Plan: [place] In-house Referral: Clinical Social Work   Post Acute Care Choice: [place] Living arrangements for the past 2 months: Single Family Home                                       Social Determinants of Health (SDOH) Interventions    Readmission Risk Interventions No flowsheet data found.

## 2020-06-06 DEATH — deceased

## 2020-06-07 DIAGNOSIS — K652 Spontaneous bacterial peritonitis: Secondary | ICD-10-CM | POA: Diagnosis not present

## 2020-06-07 LAB — CBC WITH DIFFERENTIAL/PLATELET
Abs Immature Granulocytes: 0.04 10*3/uL (ref 0.00–0.07)
Basophils Absolute: 0.1 10*3/uL (ref 0.0–0.1)
Basophils Relative: 1 %
Eosinophils Absolute: 0.3 10*3/uL (ref 0.0–0.5)
Eosinophils Relative: 4 %
HCT: 35.5 % — ABNORMAL LOW (ref 39.0–52.0)
Hemoglobin: 11.8 g/dL — ABNORMAL LOW (ref 13.0–17.0)
Immature Granulocytes: 1 %
Lymphocytes Relative: 11 %
Lymphs Abs: 0.9 10*3/uL (ref 0.7–4.0)
MCH: 31.1 pg (ref 26.0–34.0)
MCHC: 33.2 g/dL (ref 30.0–36.0)
MCV: 93.7 fL (ref 80.0–100.0)
Monocytes Absolute: 0.7 10*3/uL (ref 0.1–1.0)
Monocytes Relative: 8 %
Neutro Abs: 6.2 10*3/uL (ref 1.7–7.7)
Neutrophils Relative %: 75 %
Platelets: 16 10*3/uL — CL (ref 150–400)
RBC: 3.79 MIL/uL — ABNORMAL LOW (ref 4.22–5.81)
RDW: 20.2 % — ABNORMAL HIGH (ref 11.5–15.5)
WBC: 8.2 10*3/uL (ref 4.0–10.5)
nRBC: 0 % (ref 0.0–0.2)

## 2020-06-07 LAB — BASIC METABOLIC PANEL
Anion gap: 9 (ref 5–15)
BUN: 39 mg/dL — ABNORMAL HIGH (ref 8–23)
CO2: 29 mmol/L (ref 22–32)
Calcium: 8.5 mg/dL — ABNORMAL LOW (ref 8.9–10.3)
Chloride: 95 mmol/L — ABNORMAL LOW (ref 98–111)
Creatinine, Ser: 1.14 mg/dL (ref 0.61–1.24)
GFR calc Af Amer: >60 mL/min (ref >60)
GFR calc non Af Amer: >60 mL/min (ref >60)
Glucose, Bld: 100 mg/dL — ABNORMAL HIGH (ref 70–99)
Potassium: 5.1 mmol/L (ref 3.5–5.1)
Sodium: 133 mmol/L — ABNORMAL LOW (ref 135–145)

## 2020-06-07 LAB — MAGNESIUM: Magnesium: 2.2 mg/dL (ref 1.7–2.4)

## 2020-06-07 MED ORDER — ALPRAZOLAM 0.25 MG PO TABS
0.2500 mg | ORAL_TABLET | Freq: Once | ORAL | Status: DC
  Administered 2020-06-07: 0.25 mg via ORAL

## 2020-06-07 NOTE — Progress Notes (Addendum)
Progress Note    SHAFTER JUPIN  PYK:[id number] DOB: 02-Dec-1955  DOA: 05/16/2020 PCP: Cletis Athens, MD      Brief Narrative:    Medical records reviewed and are as summarized below:  John [name] is a 64 y.o. male with medical history significant forhepatocellular carcinoma and cholangiocarcinoma on bevacizumab/atezolizumab complicated by recurrent ascites status post peritoneal catheter which was placed on09/01, history of chronic alcoholic pancreatitis, hepatitis B, COPD, seizure disorder who presented to the ED via EMS for evaluation of worsening abdominal pain.   Patient receives all his oncological care at [facility] and had requested transfer to [facility].However, there were no beds available so he was managed at [facility].  He was found to have spontaneous bacterial peritonitis.  Ascitic fluid WBC was consistent with SBP and ascitic fluid culture showed Streptococcus mitis/oralis. He completed 5-day course of IV Rocephin on 05/29/2020.  He was previously on ciprofloxacin for SBP prophylaxis and this has been resumed.  He has mixed hepatocellular carcinoma/cholangiocarcinoma and he is status post Y 90 radioembolization in 2019 followed by Gem/Cis and capecitabine monotherapy with disease progression currently on Bev/Atezo. Last dose on 03/26/2020.  Next chemo was planned for 9/22 @ [facility] but this has been postponed because of recent acute illness.  He has a right lower quadrant percutaneous drain for drainage of ascitic fluid 3 times a week on Tuesdays, Thursdays and Saturdays.  He was also treated with lactulose and rifaximin for hepatic encephalopathy.  He has portal vein thrombosis but unfortunately he is not a candidate for anticoagulation because of severe thrombocytopenia.  He was evaluated by PT and OT recommended further rehabilitation at a skilled nursing facility.  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     PLAN  Continue Lasix for liver cirrhosis Aldactone on hold because of recent hyperkalemia. Continue lactulose and rifaximin for hepatic encephalopathy Completed Rocephin for treatment of SBP on 05/29/2020.  Continue ciprofloxacin for SBP prophylaxis. No anticoagulation for portal vein thrombosis for now given significant thrombocytopenia. Outpatient follow-up with oncologist at [facility] for further management of hepatocellular carcinoma. Analgesics as needed for pain. He takes avatrombopag at home but this is nonformulary and not available in the hospital.  Patient said he cannot get anybody to bring this medicine from home.  I was notified by Amy, pharmacist, that this medicine is unavailable. Awaiting placement to SNF   Diet Order            Diet 2 gram sodium Room service appropriate? Yes; Fluid consistency: Thin  Diet effective now                    Consultants:  Oncologist  Palliative care team  Gastroenterologist  Procedures:  None    Medications:   . sodium chloride   Intravenous Once  . Avatrombopag Maleate  40 mg Oral Daily  . baclofen  10 mg Oral TID  . ciprofloxacin  500 mg Oral Daily  . collagenase   Topical Daily  . folic  acid  1 mg Oral Daily  . furosemide  40 mg Oral BID  . lactulose  20 g Oral BID  . loratadine  10 mg Oral Daily  . magnesium oxide  400 mg Oral Daily  . morphine  15 mg Oral Q12H  . pantoprazole  40 mg Oral Daily  . rifaximin  550 mg Oral BID  . sodium  chloride flush  3 mL Intravenous Q12H  . spironolactone  25 mg Oral Daily   Continuous Infusions: . sodium chloride    . sodium chloride Stopped (05/17/2020 2338)     Anti-infectives (From admission, onward)   Start     Dose/Rate Route Frequency Ordered Stop   06/03/20 1000  ciprofloxacin (CIPRO) tablet 500 mg        500 mg Oral Daily 06/03/20 0748     05/29/20 1300  cefTRIAXone (ROCEPHIN) 2 g in sodium chloride 0.9 % 100 mL IVPB        2 g 200 mL/hr over 30 Minutes Intravenous  Once 05/28/20 1354 05/29/20 1323   05/27/20 1600  rifaximin (XIFAXAN) tablet 550 mg        550 mg Oral 2 times daily 05/27/20 1506     05/27/20 0900  cefTRIAXone (ROCEPHIN) 2 g in sodium chloride 0.9 % 100 mL IVPB  Status:  Discontinued        2 g 200 mL/hr over 30 Minutes Intravenous Every 24 hours 05/26/20 1149 05/28/20 1302   05/13/2020 1800  cefTRIAXone (ROCEPHIN) 2 g in sodium chloride 0.9 % 100 mL IVPB  Status:  Discontinued        2 g 200 mL/hr over 30 Minutes Intravenous Every 12 hours 05/27/2020 0936 05/26/20 1149   05/09/2020 0615  cefTRIAXone (ROCEPHIN) 2 g in sodium chloride 0.9 % 100 mL IVPB        2 g 200 mL/hr over 30 Minutes Intravenous  Once 05/31/2020 0604 05/27/2020 0736             Family Communication/Anticipated D/C date and plan/Code Status   DVT prophylaxis: Place and maintain sequential compression device Start: 05/26/20 0823 SCDs Start: 05/10/2020 0935     Code Status: Full Code  Family Communication:  Disposition Plan:    Status is: Inpatient  Remains inpatient appropriate because:Unsafe d/c plan   Dispo:  Patient From: Home  Planned Disposition: [place]  Expected discharge date: 06/09/20 (SNF's declining (pt on chemo at [facility] will need transport))  Medically stable for discharge: Yes            Subjective:   No acute issues.  Patient was reluctant to have ascitic fluid drained today.  Objective:    Vitals:   06/06/20 1946 06/07/20 0610  06/07/20 1108 06/07/20 1149  BP: 118/62 (!) 115/57 109/61 105/66  Pulse: 89 93 83 88  Resp:   20 20  Temp:  98.3 F (36.8 C) 97.7 F (36.5 C) 97.9 F (36.6 C)  TempSrc: Oral   Oral  SpO2: 90% 90% 93% 94%  Weight:      Height:       No data found.   Intake/Output Summary (Last 24 hours) at 06/07/2020 1627 Last data filed at 06/07/2020 1412 Gross per 24 hour  Intake 480 ml  Output 900 ml  Net -420 ml   Filed Weights   05/30/20 0422 05/31/20 0332 06/02/20 0257  Weight: 71.5 kg 71.5 kg 71.4 kg    Exam:  GEN: No acute distress SKIN: Right lower  quadrant superficial abdominal wound EYES: No pallor or icterus ENT: MMM CV: Regular rate and rhythm PULM: No wheezing or rales heard ABD: Soft, distended, nontender, right lower quadrant drain in place CNS: Alert and oriented.  No focal deficits. EXT: No swelling or tenderness.     Data Reviewed:   I have personally reviewed following labs and imaging studies:  Labs: Labs show the following:   Basic Metabolic Panel: Recent Labs  Lab 06/04/20 0505 06/04/20 0505 06/05/20 0536 06/05/20 0536 06/06/20 1431 06/07/20 0530  NA 137  --  135  --  132* 133*  K 5.3*   < > 5.2*   < > 5.1 5.1  CL 96*  --  96*  --  96* 95*  CO2 28  --  30  --  27 29  GLUCOSE 110*  --  98  --  122* 100*  BUN 46*  --  41*  --  40* 39*  CREATININE 0.95  --  0.78  --  0.95 1.14  CALCIUM 8.9  --  8.7*  --  8.3* 8.5*  MG  --   --   --   --   --  2.2   < > = values in this interval not displayed.   GFR Estimated Creatinine Clearance: 67 mL/min (by C-G formula based on SCr of 1.14 mg/dL). Liver Function Tests: No results for input(s): AST, ALT, ALKPHOS, BILITOT, PROT, ALBUMIN in the last 168 hours. No results for input(s): LIPASE, AMYLASE in the last 168 hours. Recent Labs  Lab 06/05/20 0536  AMMONIA 36*   Coagulation profile No results for input(s): INR, PROTIME in the last 168 hours.  CBC: Recent Labs  Lab 06/03/20 0343 06/04/20 0505  06/05/20 0536 06/06/20 0458 06/07/20 0530  WBC 6.4 11.4* 15.6* 8.7 8.2  NEUTROABS  --   --   --   --  6.2  HGB 12.6* 13.4 12.6* 11.7* 11.8*  HCT 40.0 41.8 39.0 36.8* 35.5*  MCV 98.0 96.5 96.1 97.4 93.7  PLT 16* 22* 20* 18* 16*   Cardiac Enzymes: No results for input(s): CKTOTAL, CKMB, CKMBINDEX, TROPONINI in the last 168 hours. BNP (last 3 results) No results for input(s): PROBNP in the last 8760 hours. CBG: No results for input(s): GLUCAP in the last 168 hours. D-Dimer: No results for input(s): DDIMER in the last 72 hours. Hgb A1c: No results for input(s): HGBA1C in the last 72 hours. Lipid Profile: No results for input(s): CHOL, HDL, LDLCALC, TRIG, CHOLHDL, LDLDIRECT in the last 72 hours. Thyroid function studies: No results for input(s): TSH, T4TOTAL, T3FREE, THYROIDAB in the last 72 hours.  Invalid input(s): FREET3 Anemia work up: No results for input(s): VITAMINB12, FOLATE, FERRITIN, TIBC, IRON, RETICCTPCT in the last 72 hours. Sepsis Labs: Recent Labs  Lab 06/04/20 0505 06/05/20 0536 06/06/20 0458 06/07/20 0530  WBC 11.4* 15.6* 8.7 8.2    Microbiology No results found for this or any previous visit (from the past 240 hour(s)).  Procedures and diagnostic studies:  No results found.             LOS: 13 days   [place] Copywriter, advertising on [email or web address]. If 7PM-7AM, please contact night-coverage at [email or web address]     06/07/2020, 4:27 PM

## 2020-06-07 NOTE — Progress Notes (Signed)
Abdominal fluid of 450 mL was drained at 10:30 am. The patient only want this RN to drain 450 mL . He was instructed by this RN that the order is up to 3L. Patient wanted me to stop at 450 mL due to pain. Pt sated he may want it drain again tomorrow or Monday. Patient was very paranoid of draining due to pain. Patient has no signs of acute distress or respiratory distress. We will continue to monitor.

## 2020-06-07 NOTE — Progress Notes (Signed)
Pt. med avatrombopag,  is not available to be given here this admission due to family (brother Ronalee Belts) unable to bring it in from home. Pharmacist and MD made aware.

## 2020-06-08 DIAGNOSIS — K652 Spontaneous bacterial peritonitis: Secondary | ICD-10-CM | POA: Diagnosis not present

## 2020-06-08 LAB — BASIC METABOLIC PANEL
Anion gap: 10 (ref 5–15)
BUN: 36 mg/dL — ABNORMAL HIGH (ref 8–23)
CO2: 29 mmol/L (ref 22–32)
Calcium: 8.3 mg/dL — ABNORMAL LOW (ref 8.9–10.3)
Chloride: 96 mmol/L — ABNORMAL LOW (ref 98–111)
Creatinine, Ser: 1.06 mg/dL (ref 0.61–1.24)
GFR calc Af Amer: >60 mL/min (ref >60)
GFR calc non Af Amer: >60 mL/min (ref >60)
Glucose, Bld: 114 mg/dL — ABNORMAL HIGH (ref 70–99)
Potassium: 4.7 mmol/L (ref 3.5–5.1)
Sodium: 135 mmol/L (ref 135–145)

## 2020-06-08 LAB — CBC WITH DIFFERENTIAL/PLATELET
Abs Immature Granulocytes: 0.03 10*3/uL (ref 0.00–0.07)
Basophils Absolute: 0.1 10*3/uL (ref 0.0–0.1)
Basophils Relative: 1 %
Eosinophils Absolute: 0.3 10*3/uL (ref 0.0–0.5)
Eosinophils Relative: 4 %
HCT: 36.6 % — ABNORMAL LOW (ref 39.0–52.0)
Hemoglobin: 11.9 g/dL — ABNORMAL LOW (ref 13.0–17.0)
Immature Granulocytes: 0 %
Lymphocytes Relative: 10 %
Lymphs Abs: 0.8 10*3/uL (ref 0.7–4.0)
MCH: 31.6 pg (ref 26.0–34.0)
MCHC: 32.5 g/dL (ref 30.0–36.0)
MCV: 97.1 fL (ref 80.0–100.0)
Monocytes Absolute: 0.7 10*3/uL (ref 0.1–1.0)
Monocytes Relative: 9 %
Neutro Abs: 6 10*3/uL (ref 1.7–7.7)
Neutrophils Relative %: 76 %
Platelets: 17 10*3/uL — CL (ref 150–400)
RBC: 3.77 MIL/uL — ABNORMAL LOW (ref 4.22–5.81)
RDW: 20.7 % — ABNORMAL HIGH (ref 11.5–15.5)
WBC: 7.9 10*3/uL (ref 4.0–10.5)
nRBC: 0 % (ref 0.0–0.2)

## 2020-06-08 NOTE — Progress Notes (Signed)
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 male with medical history significant forhepatocellular carcinoma and cholangiocarcinoma on bevacizumab/atezolizumab complicated by recurrent ascites status post peritoneal catheter which was placed on09/01, history of chronic alcoholic pancreatitis, hepatitis B, COPD, seizure disorder who presented to the ED via EMS for evaluation of worsening abdominal pain.  Patient receives all his oncological care at [facility] and had requested transfer to [facility].However, there were no beds available so he was managed at [facility]  He was found to have spontaneous bacterial peritonitis.  Ascitic fluid WBC was consistent with SBP and ascitic fluid culture showed Streptococcus mitis/oralis. He completed 5-day course of IV Rocephin on 05/29/2020.  He was previously on ciprofloxacin for SBP prophylaxis and this has been resumed.  He has mixed hepatocellular carcinoma/cholangiocarcinoma and he is status post Y 90 radioembolization in 2019 followed by Gem/Cis and capecitabine monotherapy with disease progression currently on Bev/Atezo. Last dose on 03/26/2020. Next chemo was planned for 9/22 @ [facility] but this has been postponed because of recent acute illness.  He has a right lower quadrant percutaneous drain for drainage of ascitic fluid 3 times a week on Tuesdays, Thursdays and Saturdays.  He was also treated with lactulose and rifaximin for hepatic encephalopathy.  He has portal vein thrombosis but  unfortunately he is not a candidate for anticoagulation because of severe thrombocytopenia.  He was evaluated by PT and OT recommended further rehabilitation at a skilled nursing facility.    # Liver cirrhosis with hepatic encephalopathy PLAN:  --cont lactulose and rifaximin  # recurrent ascites status post peritoneal catheter which placed on09/01 --schedule to drain 3 times a week on Tuesdays, Thursdays and Saturdays.  Up to 3L.   PLAN: --cont schedule drain --cont lasix and aldactone  # SBP, POA, treated Ascitic fluid WBC was consistent with SBP and ascitic fluid culture showed Streptococcus mitis/oralis. He completed 5-day course of IV Rocephin on 05/29/2020.   --cont cipro for SBP ppx  mixed hepatocellular carcinoma/cholangiocarcinoma --pt on avatrombopag.  Hospital doesn't have this medication.  Family unable to bring it to the hospital. --outpatient f/u with [facility].  portal vein thrombosis  --not a candidate for anticoagulation due to severe thrombocytopenia  Thrombocytopenia 2/2 liver cirrhosis --plt 10's-20's --no need for transfusion if not bleeding.   DVT prophylaxis: SCD/Compression stockings Code Status: Full code  Family Communication:  Status is: inpatient Dispo:   The patient is from: home Anticipated d/c is to: SNF  (SNF's declining (pt on chemo at [facility] will need transport)) Anticipated d/c date is: when bed available Patient currently is medically stable to d/c.   Subjective and Interval History:  Pt complained of nausea which he blamed on lactulose.   Pt only allowed 450 ml drained yesterday, due to concern for pain.     Objective: Vitals:   06/08/20 0442 06/08/20 0913 06/08/20 1204 06/08/20 1546  BP: (!) 112/56 109/70 (!) 111/56 (!) 128/94  Pulse: 88 88 81 97  Resp:  18 16 16   Temp: 98.2 F (36.8  C) 97.8 F (36.6 C)  97.7 F (36.5 C)  TempSrc:  Oral  Oral  SpO2: 94% 95% 98% 96%  Weight:      Height:        Intake/Output Summary (Last  24 hours) at 06/08/2020 1804 Last data filed at 06/08/2020 1013 Gross per 24 hour  Intake 240 ml  Output --  Net 240 ml   Filed Weights   05/30/20 0422 05/31/20 0332 06/02/20 0257  Weight: 71.5 kg 71.5 kg 71.4 kg    Examination:   Constitutional: NAD, AAOx3 HEENT: conjunctivae and lids normal, EOMI CV: RRR no M,R,G. Distal pulses +2.  No cyanosis.   RESP: CTA B/L, normal respiratory effort  GI: +BS, large abdomen but not tense. Extremities: No effusions, edema, or tenderness in BLE SKIN: warm, dry and intact Neuro: II - XII grossly intact.  Sensation intact Psych: Grouchy and depressed mood and affect.    Data Reviewed: I have personally reviewed following labs and imaging studies  CBC: Recent Labs  Lab 06/04/20 0505 06/05/20 0536 06/06/20 0458 06/07/20 0530 06/08/20 0435  WBC 11.4* 15.6* 8.7 8.2 7.9  NEUTROABS  --   --   --  6.2 6.0  HGB 13.4 12.6* 11.7* 11.8* 11.9*  HCT 41.8 39.0 36.8* 35.5* 36.6*  MCV 96.5 96.1 97.4 93.7 97.1  PLT 22* 20* 18* 16* 17*   Basic Metabolic Panel: Recent Labs  Lab 06/04/20 0505 06/05/20 0536 06/06/20 1431 06/07/20 0530 06/08/20 0435  NA 137 135 132* 133* 135  K 5.3* 5.2* 5.1 5.1 4.7  CL 96* 96* 96* 95* 96*  CO2 28 30 27 29 29   GLUCOSE 110* 98 122* 100* 114*  BUN 46* 41* 40* 39* 36*  CREATININE 0.95 0.78 0.95 1.14 1.06  CALCIUM 8.9 8.7* 8.3* 8.5* 8.3*  MG  --   --   --  2.2  --    GFR: Estimated Creatinine Clearance: 72 mL/min (by C-G formula based on SCr of 1.06 mg/dL). Liver Function Tests: No results for input(s): AST, ALT, ALKPHOS, BILITOT, PROT, ALBUMIN in the last 168 hours. No results for input(s): LIPASE, AMYLASE in the last 168 hours. Recent Labs  Lab 06/05/20 0536  AMMONIA 36*   Coagulation Profile: No results for input(s): INR, PROTIME in the last 168 hours. Cardiac Enzymes: No results for input(s): CKTOTAL, CKMB, CKMBINDEX, TROPONINI in the last 168 hours. BNP (last 3 results) No results for input(s):  PROBNP in the last 8760 hours. HbA1C: No results for input(s): HGBA1C in the last 72 hours. CBG: No results for input(s): GLUCAP in the last 168 hours. Lipid Profile: No results for input(s): CHOL, HDL, LDLCALC, TRIG, CHOLHDL, LDLDIRECT in the last 72 hours. Thyroid Function Tests: No results for input(s): TSH, T4TOTAL, FREET4, T3FREE, THYROIDAB in the last 72 hours. Anemia Panel: No results for input(s): VITAMINB12, FOLATE, FERRITIN, TIBC, IRON, RETICCTPCT in the last 72 hours. Sepsis Labs: No results for input(s): PROCALCITON, LATICACIDVEN in the last 168 hours.  No results found for this or any previous visit (from the past 240 hour(s)).    Radiology Studies: No results found.   Scheduled Meds: . sodium chloride   Intravenous Once  . baclofen  10 mg Oral TID  . ciprofloxacin  500 mg Oral Daily  . collagenase   Topical Daily  . folic acid  1 mg Oral Daily  . furosemide  40 mg Oral BID  . lactulose  20 g Oral BID  . loratadine  10 mg Oral  Daily  . magnesium oxide  400 mg Oral Daily  . morphine  15 mg Oral Q12H  . pantoprazole  40 mg Oral Daily  . rifaximin  550 mg Oral BID  . sodium chloride flush  3 mL Intravenous Q12H  . spironolactone  25 mg Oral Daily   Continuous Infusions: . sodium chloride    . sodium chloride Stopped (05/18/2020 2338)     LOS: 14 days     Enzo Bi, MD [facility] If 7PM-7AM, please contact night-coverage 06/08/2020, 6:04 PM

## 2020-06-09 DIAGNOSIS — K652 Spontaneous bacterial peritonitis: Secondary | ICD-10-CM | POA: Diagnosis not present

## 2020-06-09 LAB — BASIC METABOLIC PANEL
Anion gap: 14 (ref 5–15)
BUN: 33 mg/dL — ABNORMAL HIGH (ref 8–23)
CO2: 27 mmol/L (ref 22–32)
Calcium: 8.9 mg/dL (ref 8.9–10.3)
Chloride: 95 mmol/L — ABNORMAL LOW (ref 98–111)
Creatinine, Ser: 1.09 mg/dL (ref 0.61–1.24)
GFR calc Af Amer: >60 mL/min (ref >60)
GFR calc non Af Amer: >60 mL/min (ref >60)
Glucose, Bld: 84 mg/dL (ref 70–99)
Potassium: 4.7 mmol/L (ref 3.5–5.1)
Sodium: 136 mmol/L (ref 135–145)

## 2020-06-09 LAB — CBC
HCT: 41.7 % (ref 39.0–52.0)
Hemoglobin: 13.5 g/dL (ref 13.0–17.0)
MCH: 31.4 pg (ref 26.0–34.0)
MCHC: 32.4 g/dL (ref 30.0–36.0)
MCV: 97 fL (ref 80.0–100.0)
Platelets: 14 10*3/uL — CL (ref 150–400)
RBC: 4.3 MIL/uL (ref 4.22–5.81)
RDW: 20.7 % — ABNORMAL HIGH (ref 11.5–15.5)
WBC: 10.8 10*3/uL — ABNORMAL HIGH (ref 4.0–10.5)
nRBC: 0 % (ref 0.0–0.2)

## 2020-06-09 LAB — MAGNESIUM: Magnesium: 2.2 mg/dL (ref 1.7–2.4)

## 2020-06-09 MED ORDER — FUROSEMIDE 20 MG PO TABS
40.0000 mg | ORAL_TABLET | Freq: Two times a day (BID) | ORAL | Status: DC
  Administered 2020-06-09 – 2020-06-21 (×21): 40 mg via ORAL
  Filled 2020-06-09 (×23): qty 1

## 2020-06-09 NOTE — Progress Notes (Signed)
Occupational Therapy Treatment Patient Details Name: John [name] MRN: [id number] DOB: April 08, 1956 Today's Date: 06/09/2020    History of present illness 64 y.o. 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 Pt received with bilateral LEs off the side of the bed with the bedrail up. Pt reports he was attempting to get to the [facility] to urinate. Pt provided with urinal and with bedrail down and bed alarm turned off, pt was able to perform toileting task from EOB with urinal and supervision for safety. Pt noted burning with urination. RN notified at end of session. Pt able to perform sit>supine back to bed with supervision for safety. Bed alarm turned on and pt instructed to call for assist for toileting needs in the future. Pt continues to benefit from skilled OT services. Continue to recommend SNF at this time.    Follow Up Recommendations  SNF    Equipment Recommendations  3 in 1 bedside commode    Recommendations for Other Services      Precautions / Restrictions Precautions Precautions: Fall Restrictions Weight Bearing Restrictions: No       Mobility Bed Mobility Overal bed mobility: Needs Assistance         Sit to supine: HOB elevated;Supervision   General bed mobility comments: increased time/effort to perform and use of bedrails to assist  Transfers                      Balance Overall balance assessment: Needs assistance Sitting-balance support: Single extremity supported;Feet supported Sitting balance-Leahy Scale: Fair                                     ADL either performed or  assessed with clinical judgement   ADL Overall ADL's : Needs assistance/impaired                             Toileting- Clothing Manipulation and Hygiene: Set up;Supervision/safety Toileting - Clothing Manipulation Details (indicate cue type and reason): Pt sat EOB to perform toileting wiht set up of urinal, supervision for safety             Vision       Perception     Praxis      Cognition Arousal/Alertness: Awake/alert Behavior During Therapy: WFL for tasks assessed/performed Overall Cognitive Status: No family/caregiver present to determine baseline cognitive functioning                                 General Comments: Somewhat disgruntled, was attempting to get OOB himself when OT walked into his room; VC to redirect        Exercises     Shoulder Instructions       General Comments      Pertinent Vitals/ Pain       Pain Assessment: Faces Faces Pain Scale: Hurts little more Pain Location: buttocks Pain Descriptors / Indicators: Aching;Grimacing;Guarding Pain Intervention(s): Limited activity within patient's tolerance;Monitored during session;Repositioned;Patient requesting pain meds-RN notified  Home Living                                          Prior Functioning/Environment              Frequency  Min 1X/week        Progress Toward Goals  OT Goals(current goals can now be found in the care plan section)  Progress towards OT goals: Progressing toward goals  Acute Rehab OT Goals Patient Stated Goal: to get comfortable OT Goal Formulation: With patient Time For Goal Achievement: 06/12/20 Potential to Achieve Goals: Good  Plan Discharge plan remains appropriate;Frequency remains appropriate    Co-evaluation                 AM-PAC OT "6 Clicks" Daily Activity     Outcome Measure   Help from another person eating meals?: None Help from another person taking care of personal  grooming?: A Little Help from another person toileting, which includes using toliet, bedpan, or urinal?: A Little Help from another person bathing (including washing, rinsing, drying)?: A Lot Help from another person to put on and taking off regular upper body clothing?: A Little Help from another person to put on and taking off regular lower body clothing?: A Lot 6 Click Score: 17    End of Session    OT Visit Diagnosis: Other abnormalities of gait and mobility (R26.89);Muscle weakness (generalized) (M62.81)   Activity Tolerance Patient tolerated treatment well   Patient Left in bed;with call bell/phone within reach;with bed alarm set   Nurse Communication Patient requests pain meds;Other (comment) (wants sacral patch)        Time: 3500-9381 OT Time Calculation (min): 11 min  Charges: OT General Charges $OT Visit: 1 Visit OT Treatments $Self Care/Home Management : 8-22 mins  Jeni Salles, MPH, MS, OTR/L ascom [phone] 06/09/20, 12:24 PM

## 2020-06-09 NOTE — TOC Progression Note (Signed)
Transition of Care [facility]) - Progression Note    Patient Details  Name: John [name] MRN: [id number] Date of Birth: 14-Jun-1956  Transition of Care [facility]) CM/SW [place], LCSW Phone Number: 06/09/2020, 2:35 PM  Clinical Narrative: [facility] liaison is sending clinicals to [facility] facility for review.    Expected Discharge Plan: [place] Barriers to Discharge: No SNF bed, Insurance Authorization  Expected Discharge Plan and Services Expected Discharge Plan: [place] In-house Referral: Clinical Social Work   Post Acute Care Choice: [place] Living arrangements for the past 2 months: Single Family Home                                       Social Determinants of Health (SDOH) Interventions    Readmission Risk Interventions No flowsheet data found.

## 2020-06-09 NOTE — Progress Notes (Deleted)
Patient ambulated with sitter(Carolyn) in hall with walker on RA and O2 saturation dropped to 79%. Patient was then placed on 2 L  and and O2@ saturation increased to 89-90% while walking. Sitting on side of bed O2 came up to 98% on  2LNC. No s/s of distress. Patent tolerated well.   Thresa Ross, RN

## 2020-06-09 NOTE — Progress Notes (Signed)
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 male with medical history significant forhepatocellular carcinoma and cholangiocarcinoma on bevacizumab/atezolizumab complicated by recurrent ascites status post peritoneal catheter which was placed on09/01, history of chronic alcoholic pancreatitis, hepatitis B, COPD, seizure disorder who presented to the ED via EMS for evaluation of worsening abdominal pain.  Patient receives all his oncological care at [facility] and had requested transfer to [facility].However, there were no beds available so he was managed at [facility].  He was found to have spontaneous bacterial peritonitis.  Ascitic fluid WBC was consistent with SBP and ascitic fluid culture showed Streptococcus mitis/oralis. He completed 5-day course of IV Rocephin on 05/29/2020.  He was previously on ciprofloxacin for SBP prophylaxis and this has been resumed.  He has mixed hepatocellular carcinoma/cholangiocarcinoma and he is status post Y 90 radioembolization in 2019 followed by Gem/Cis and capecitabine monotherapy with disease progression currently on Bev/Atezo. Last dose on 03/26/2020. Next chemo was planned for 9/22 @ [facility] but this has been postponed because of recent acute illness.  He has a right lower quadrant percutaneous drain for drainage of ascitic fluid 3 times a week on Tuesdays, Thursdays and Saturdays.  He was also treated with lactulose and rifaximin for hepatic encephalopathy.  He has portal vein thrombosis but  unfortunately he is not a candidate for anticoagulation because of severe thrombocytopenia.  He was evaluated by PT and OT recommended further rehabilitation at a skilled nursing facility.    # Liver cirrhosis with hx of hepatic encephalopathy PLAN:  --cont lactulose and rifaximin --Ensure 2-3 BM's per day  # recurrent ascites status post peritoneal catheter which placed on09/01 --schedule to drain 3 times a week on Tuesdays, Thursdays and Saturdays.  Up to 3L.   PLAN: --cont schedule drain --cont lasix and aldactone (schedule for earlier in the day)  # SBP, POA, treated Ascitic fluid WBC was consistent with SBP and ascitic fluid culture showed Streptococcus mitis/oralis. He completed 5-day course of IV Rocephin on 05/29/2020.  No abdominal pain. PLAN: --cont Cipro for SBP ppx  mixed hepatocellular carcinoma/cholangiocarcinoma --pt on home avatrombopag.  Hospital doesn't have this medication.  Family unable to bring it to the hospital. --outpatient f/u with [facility].  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   DVT prophylaxis: SCD/Compression stockings Code Status: Full code  Family Communication:  Status is: inpatient Dispo:   The patient is from: home Anticipated d/c is to: SNF near [facility] Anticipated d/c date is: when bed available Patient currently is medically stable to d/c.   Subjective and Interval History:  Pt reported good urine output in response to diuretic, but complained of night-time voiding.  Having BM daily.  No nausea today.  No dyspnea.   Objective: Vitals:   06/08/20 2333 06/09/20 0540 06/09/20 0804 06/09/20 1237  BP: 125/62 (!) 116/57 114/69 138/77  Pulse: 85 87 87 93  Resp: (!) 9 20 18  18  Temp: 98.1 F (36.7 C) (!) 97.5 F (36.4 C) (!) 97.5 F (36.4 C) (!) 97.5 F (36.4 C)  TempSrc:  Oral Oral  Oral  SpO2: 95% 99% 97% 93%  Weight:      Height:        Intake/Output Summary (Last 24 hours) at 06/09/2020 1524 Last data filed at 06/09/2020 0940 Gross per 24 hour  Intake 480 ml  Output 800 ml  Net -320 ml   Filed Weights   05/30/20 0422 05/31/20 0332 06/02/20 0257  Weight: 71.5 kg 71.5 kg 71.4 kg    Examination:   Constitutional: NAD, AAOx3, fine tremors HEENT: conjunctivae and lids normal, EOMI CV: RRR no M,R,G. Distal pulses +2.  No cyanosis.   RESP: CTA B/L, normal respiratory effort  GI: +BS, NT, large abdomen but soft, with cath present Extremities: No effusions, edema in BLE SKIN: warm, dry and intact Neuro: II - XII grossly intact.  Sensation intact   Data Reviewed: I have personally reviewed following labs and imaging studies  CBC: Recent Labs  Lab 06/05/20 0536 06/06/20 0458 06/07/20 0530 06/08/20 0435 06/09/20 0535  WBC 15.6* 8.7 8.2 7.9 10.8*  NEUTROABS  --   --  6.2 6.0  --   HGB 12.6* 11.7* 11.8* 11.9* 13.5  HCT 39.0 36.8* 35.5* 36.6* 41.7  MCV 96.1 97.4 93.7 97.1 97.0  PLT 20* 18* 16* 17* 14*   Basic Metabolic Panel: Recent Labs  Lab 06/05/20 0536 06/06/20 1431 06/07/20 0530 06/08/20 0435 06/09/20 0535  NA 135 132* 133* 135 136  K 5.2* 5.1 5.1 4.7 4.7  CL 96* 96* 95* 96* 95*  CO2 30 27 29 29 27   GLUCOSE 98 122* 100* 114* 84  BUN 41* 40* 39* 36* 33*  CREATININE 0.78 0.95 1.14 1.06 1.09  CALCIUM 8.7* 8.3* 8.5* 8.3* 8.9  MG  --   --  2.2  --  2.2   GFR: Estimated Creatinine Clearance: 70.1 mL/min (by C-G formula based on SCr of 1.09 mg/dL). Liver Function Tests: No results for input(s): AST, ALT, ALKPHOS, BILITOT, PROT, ALBUMIN in the last 168 hours. No results for input(s): LIPASE, AMYLASE in the last 168 hours. Recent Labs  Lab 06/05/20 0536  AMMONIA 36*   Coagulation Profile: No results for input(s): INR, PROTIME in the last 168 hours. Cardiac Enzymes: No results for input(s): CKTOTAL, CKMB, CKMBINDEX, TROPONINI in the  last 168 hours. BNP (last 3 results) No results for input(s): PROBNP in the last 8760 hours. HbA1C: No results for input(s): HGBA1C in the last 72 hours. CBG: No results for input(s): GLUCAP in the last 168 hours. Lipid Profile: No results for input(s): CHOL, HDL, LDLCALC, TRIG, CHOLHDL, LDLDIRECT in the last 72 hours. Thyroid Function Tests: No results for input(s): TSH, T4TOTAL, FREET4, T3FREE, THYROIDAB in the last 72 hours. Anemia Panel: No results for input(s): VITAMINB12, FOLATE, FERRITIN, TIBC, IRON, RETICCTPCT in the last 72 hours. Sepsis Labs: No results for input(s): PROCALCITON, LATICACIDVEN in the last 168 hours.  No results found for this or any previous visit (from the past 240 hour(s)).    Radiology Studies: No results found.   Scheduled Meds:  baclofen  10 mg Oral TID   ciprofloxacin  500 mg Oral Daily   collagenase   Topical Daily   folic acid  1 mg Oral Daily   furosemide  40 mg Oral BID   lactulose  20 g Oral BID   loratadine  10 mg Oral Daily  magnesium oxide  400 mg Oral Daily   morphine  15 mg Oral Q12H   pantoprazole  40 mg Oral Daily   rifaximin  550 mg Oral BID   sodium chloride flush  3 mL Intravenous Q12H   spironolactone  25 mg Oral Daily   Continuous Infusions:  sodium chloride     sodium chloride Stopped (05/07/2020 2338)     LOS: 15 days     Enzo Bi, MD [facility] If 7PM-7AM, please contact night-coverage 06/09/2020, 3:24 PM

## 2020-06-09 NOTE — Progress Notes (Addendum)
Mobility Specialist - Progress Note   06/09/20 1134  Mobility  Activity Ambulated in room;Transferred to/from [facility]  Level of Assistance Standby assist, set-up cues, supervision of patient - no hands on  Assistive Device Front wheel walker  Distance Ambulated (ft) 20 ft  Mobility Response Tolerated well  Mobility performed by Mobility specialist  $Mobility charge 1 Mobility    Pt sitting up on the bed upon arrival. Pt initially didn't want to do anything, but after educating him on the benefits from ambulation/mobility, he agreed. Noted pt had taken off sacral foam bandage, stating it was irritating him. Also, pt c/o abdominal pain, but states "it doesn't hurt that bad, but it was worst this AM". Nurse was notified. Pt continued w/ session. Pt independent w/ bed mobility and SBA for S2S. Pt ambulated ~20' in room using a RW w/ SBA. CGA utilized for safety. Pt had a slow, but steady gait t/o session. Pt's pain was steady t/o session. No LOB or SOB noted. Overall, pt tolerated session well. Pt requested to go to [facility] after ambulation. Nurse entered the room at the end of session. Pt left on [facility] w/ nurse present in room and NT was notified.     Enedina Pair Mobility Specialist  06/09/20, 11:44 AM

## 2020-06-09 NOTE — Progress Notes (Signed)
Physical Therapy Treatment Patient Details Name: John [name] MRN: [id number] DOB: [birth year] Today's Date: 06/09/2020    History of Present Illness 64 y.o. male with medical history significant for hepatocellular carcinoma and cholangiocarcinoma on bevacizumab/atezolizumab complicated by recurrent ascites status post peritoneal catheter which was placed on 09/01, history of chronic alcoholic pancreatitis, hepatitis B, COPD, seizure disorder who presented to the ED via EMS for evaluation of worsening abdominal pain    PT Comments    Pt seated edge of bed upon arrival having just returned to bed with NT from [facility]. Pt stating that he feels like he may need to go again so pt encouraged to ambulate to bathroom vs BSC. Pt agreeable and transferred sit to stand with supervision then ambulated 20 feet using RW with CGA for steadying. Pt with decreased gait speed however no overt LOB noted. Tactile cues for improved upright posture as pt with forward rounded shoulders and flexed slightly at hips. Verbal cues for walker management over threshold to get into the bathroom. Pt required mod A for eccentric control to low commode. Pt requesting additional time and NT offered to stay with pt while on toilet to perform pericare and ambulate back to bed. Will continue to follow and progress pt as tolerated.    Follow Up Recommendations  SNF;Supervision - Intermittent     Equipment Recommendations  Rolling walker with 5" wheels    Recommendations for Other Services       Precautions / Restrictions Precautions Precautions: Fall Restrictions Weight Bearing Restrictions: No    Mobility  Bed Mobility               General bed mobility comments: not performed as pt seated edge of bed having just returned from the [facility]  Transfers Overall transfer level: Needs assistance Equipment used: Rolling walker (2 wheeled) Transfers: Sit to/from Stand Sit to Stand: Supervision         General  transfer comment: supervision for sit to stand from bed to RW then mod A for eccentric control on descent to low commode in bathroom; verbal cues for BUE placement  Ambulation/Gait Ambulation/Gait assistance: Min guard Gait Distance (Feet): 20 Feet Assistive device: Rolling walker (2 wheeled) Gait Pattern/deviations: Decreased step length - right;Decreased step length - left Gait velocity: decreased   General Gait Details: reciprocal gait pattern with decreased step lengths bilaterally   Stairs             Wheelchair Mobility    Modified Rankin (Stroke Patients Only)       Balance Overall balance assessment: Needs assistance Sitting-balance support: Feet supported Sitting balance-Leahy Scale: Fair Sitting balance - Comments: static sitting balance fair to good with unsupported back   Standing balance support: Bilateral upper extremity supported;During functional activity Standing balance-Leahy Scale: Fair Standing balance comment: supervision for static and CGA for dynamic balance with BUE on RW                            Cognition Arousal/Alertness: Awake/alert Behavior During Therapy: WFL for tasks assessed/performed Overall Cognitive Status: No family/caregiver present to determine baseline cognitive functioning                                        Exercises      General Comments        Pertinent Vitals/Pain Pain Assessment:  No/denies pain    Home Living                      Prior Function            PT Goals (current goals can now be found in the care plan section) Acute Rehab PT Goals PT Goal Formulation: With patient Time For Goal Achievement: 06/13/20 Potential to Achieve Goals: Fair Progress towards PT goals: Progressing toward goals    Frequency    Min 2X/week      PT Plan Current plan remains appropriate    Co-evaluation              AM-PAC PT "6 Clicks" Mobility   Outcome Measure   Help needed turning from your back to your side while in a flat bed without using bedrails?: A Little Help needed moving from lying on your back to sitting on the side of a flat bed without using bedrails?: A Lot Help needed moving to and from a bed to a chair (including a wheelchair)?: A Little Help needed standing up from a chair using your arms (e.g., wheelchair or bedside chair)?: A Little Help needed to walk in hospital room?: A Little Help needed climbing 3-5 steps with a railing? : A Lot 6 Click Score: 16    End of Session Equipment Utilized During Treatment: Gait belt Activity Tolerance: Patient limited by fatigue Patient left: with nursing/sitter in room;Other (comment) (on commode in bathroom with NT present) Nurse Communication: Mobility status PT Visit Diagnosis: Unsteadiness on feet (R26.81);Other abnormalities of gait and mobility (R26.89);Muscle weakness (generalized) (M62.81);Pain     Time: 8889-1694 PT Time Calculation (min) (ACUTE ONLY): 9 min  Charges:                        Vale Haven, SPT   Vale Haven 06/09/2020, 4:35 PM

## 2020-06-09 NOTE — Care Management Important Message (Signed)
Important Message  Patient Details  Name: John [name] MRN: [id number] Date of Birth: [birth year]   Medicare Important Message Given:  Yes     Dannette Barbara 06/09/2020, 12:55 PM

## 2020-06-10 DIAGNOSIS — K652 Spontaneous bacterial peritonitis: Secondary | ICD-10-CM | POA: Diagnosis not present

## 2020-06-10 DIAGNOSIS — E43 Unspecified severe protein-calorie malnutrition: Secondary | ICD-10-CM | POA: Insufficient documentation

## 2020-06-10 LAB — BASIC METABOLIC PANEL
Anion gap: 11 (ref 5–15)
BUN: 31 mg/dL — ABNORMAL HIGH (ref 8–23)
CO2: 31 mmol/L (ref 22–32)
Calcium: 8.8 mg/dL — ABNORMAL LOW (ref 8.9–10.3)
Chloride: 95 mmol/L — ABNORMAL LOW (ref 98–111)
Creatinine, Ser: 1.13 mg/dL (ref 0.61–1.24)
GFR calc Af Amer: >60 mL/min (ref >60)
GFR calc non Af Amer: >60 mL/min (ref >60)
Glucose, Bld: 103 mg/dL — ABNORMAL HIGH (ref 70–99)
Potassium: 3.9 mmol/L (ref 3.5–5.1)
Sodium: 137 mmol/L (ref 135–145)

## 2020-06-10 LAB — CBC
HCT: 40.4 % (ref 39.0–52.0)
Hemoglobin: 13.3 g/dL (ref 13.0–17.0)
MCH: 31.6 pg (ref 26.0–34.0)
MCHC: 32.9 g/dL (ref 30.0–36.0)
MCV: 96 fL (ref 80.0–100.0)
Platelets: 16 10*3/uL — CL (ref 150–400)
RBC: 4.21 MIL/uL — ABNORMAL LOW (ref 4.22–5.81)
RDW: 21.1 % — ABNORMAL HIGH (ref 11.5–15.5)
WBC: 9.9 10*3/uL (ref 4.0–10.5)
nRBC: 0 % (ref 0.0–0.2)

## 2020-06-10 LAB — MAGNESIUM: Magnesium: 2.2 mg/dL (ref 1.7–2.4)

## 2020-06-10 MED ORDER — ENSURE ENLIVE PO LIQD
237.0000 mL | Freq: Three times a day (TID) | ORAL | Status: DC
  Administered 2020-06-10 – 2020-06-20 (×17): 237 mL via ORAL

## 2020-06-10 MED ORDER — ADULT MULTIVITAMIN W/MINERALS CH
1.0000 | ORAL_TABLET | Freq: Every day | ORAL | Status: DC
  Administered 2020-06-11 – 2020-06-20 (×10): 1 via ORAL
  Filled 2020-06-10 (×10): qty 1

## 2020-06-10 NOTE — Progress Notes (Signed)
Initial Nutrition Assessment  DOCUMENTATION CODES:   Severe malnutrition in context of chronic illness  INTERVENTION:   Ensure Enlive po TID, each supplement provides 350 kcal and 20 grams of protein  Magic cup TID with meals, each supplement provides 290 kcal and 9 grams of protein  MVI daily   NUTRITION DIAGNOSIS:   Severe Malnutrition related to chronic illness (hepatocellular carcinoma, COPD) as evidenced by 23 percent weight loss in 9 months, severe muscle depletion, severe fat depletion.  GOAL:   Patient will meet greater than or equal to 90% of their needs  MONITOR:   PO intake, Supplement acceptance, Labs, Weight trends, Skin, I & O's  REASON FOR ASSESSMENT:   LOS    ASSESSMENT:   64 y.o. male  with medical history significant for hepatocellular carcinoma and cholangiocarcinoma on bevacizumab/atezolizumab complicated by recurrent ascites status post peritoneal catheter which was placed on 09/01, history of chronic alcoholic pancreatitis, hepatitis B, COPD and seizure disorder who is admitted with SBP   Met with pt in room today. Pt irritable today; reports that he is tired of taking pills and being forced to eat. Pt reports that he is full from taking so many pills with ice cream that he is unable to eat. RD suspects pt's fullness is more related to his ascites. Pt was sitting up eating at time of RD visit today. Pt is documented to be eating anywhere from sips/bites to 100% of meals. Pt reports the he does drink vanilla supplements; pt reports that chocolate causes him to become constipated. Per chart, pt is down 46lbs(23%) over the past 9 months; this is significant weight loss. Pt is nervous about having his ascites drained as he reports that it is just too painful. RD explained that pt may be able to eat more once his ascites is improved. RD will add supplements and MVI to help pt meet his estimated needs. Pt is noted to have wounds; pt would benefit from vitamin C  supplementation but RD will hold off on ordering this for now as pt already sick of taking pills.   Medications reviewed and include: ciprofloxacin, folic acid, lasix, lactulose, Mg oxide, morphine, protonix, aldactone  Labs reviewed: BUN 31(H), Mg 2.2 wnl  NUTRITION - FOCUSED PHYSICAL EXAM:    Most Recent Value  Orbital Region Moderate depletion  Upper Arm Region Severe depletion  Thoracic and Lumbar Region Severe depletion  Buccal Region Moderate depletion  Temple Region Moderate depletion  Clavicle Bone Region Severe depletion  Clavicle and Acromion Bone Region Severe depletion  Scapular Bone Region Severe depletion  Dorsal Hand Severe depletion  Patellar Region Severe depletion  Anterior Thigh Region Severe depletion  Posterior Calf Region Severe depletion  Edema (RD Assessment) Mild  Hair Reviewed  Eyes Reviewed  Mouth Reviewed  Skin Reviewed  Nails Reviewed     Diet Order:   Diet Order            Diet 2 gram sodium Room service appropriate? Yes; Fluid consistency: Thin  Diet effective now                EDUCATION NEEDS:   Education needs have been addressed  Skin:  Skin Assessment: Reviewed RN Assessment (needle puncture 1cm x 2cm, sternal area 2cm x 1cm x 0.1cm, Stage II sacrum)  Last BM:  10/4- type 6  Height:   Ht Readings from Last 1 Encounters:  06/05/2020 6' (1.829 m)    Weight:   Wt Readings from Last 1  Encounters:  06/02/20 71.4 kg    Ideal Body Weight:  80.9 kg  BMI:  Body mass index is 21.34 kg/m.  Estimated Nutritional Needs:   Kcal:  2100-2400kcal/day  Protein:  110-120g/day  Fluid:  2.1L/day  Koleen Distance MS, RD, LDN Please refer to [facility] for RD and/or RD on-call/weekend/after hours pager

## 2020-06-10 NOTE — TOC Progression Note (Signed)
Transition of Care [facility]) - Progression Note    Patient Details  Name: John [name] MRN: [id number] Date of Birth: [birth year]  Transition of [facility]) CM/SW [place], LCSW Phone Number: 06/10/2020, 4:30 PM  Clinical Narrative: Decision from [facility] still pending. Referral faxed to [facility] SNF in [place]. Left messages for admissions coordinators at [place] [place] in [place].  Expected Discharge Plan: [place] Barriers to Discharge: No SNF bed, Insurance Authorization  Expected Discharge Plan and Services Expected Discharge Plan: [place] In-house Referral: Clinical Social Work   Post Acute Care Choice: [place] Living arrangements for the past 2 months: Single Family Home                                       Social Determinants of Health (SDOH) Interventions    Readmission Risk Interventions No flowsheet data found.

## 2020-06-10 NOTE — Progress Notes (Signed)
Mobility Specialist - Progress Note   06/10/20 1500  Mobility  Activity Dangled on edge of bed  Range of Motion/Exercises All extremities (arm raises, standing march, standing adduction, slr)  Level of Assistance Standby assist, set-up cues, supervision of patient - no hands on  Assistive Device Front wheel walker  Distance Ambulated (ft) 0 ft  Mobility Response Tolerated well  Mobility performed by Mobility specialist  $Mobility charge 1 Mobility    Pre-mobility: 85 HR, 91% SpO2 Post-mobility: 92 HR, 95% SpO2   Pt was lying in bed upon arrival with friend present in room. Pt agreed to session. Pt was SBA getting EOB and was able to perform STSx1 with minA. Pt was able to perform standing march and standing adduction with SBA. Pt began c/o dizziness which limited ambulation attempt. Pt needed a seated break and was able to perform seated exercises: straight leg raises, ankle pumps (10x/leg) with no physical assistance. Overall, pt tolerated session well. Pt was left EOB with all needs in reach and was informed to reach out to nurse if needing to get back up. Pt showed understanding.    Kathee Delton Mobility Specialist 06/10/20, 3:54 PM

## 2020-06-10 NOTE — Progress Notes (Signed)
Pt. allowed me to drain abdominal fluid in the amount of 500 mL only. Tolerated well. Dressing changed. We will continue to monitor.

## 2020-06-10 NOTE — Progress Notes (Signed)
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 male with medical history significant forhepatocellular carcinoma and cholangiocarcinoma on bevacizumab/atezolizumab complicated by recurrent ascites status post peritoneal catheter which was placed on09/01, history of chronic alcoholic pancreatitis, hepatitis B, COPD, seizure disorder who presented to the ED via EMS for evaluation of worsening abdominal pain.  Patient receives all his oncological care at [facility] and had requested transfer to [facility].However, there were no beds available so he was managed at [facility].  He was found to have spontaneous bacterial peritonitis.  Ascitic fluid WBC was consistent with SBP and ascitic fluid culture showed Streptococcus mitis/oralis. He completed 5-day course of IV Rocephin on 05/29/2020.  He was previously on ciprofloxacin for SBP prophylaxis and this has been resumed.  He has mixed hepatocellular carcinoma/cholangiocarcinoma and he is status post Y 90 radioembolization in 2019 followed by Gem/Cis and capecitabine monotherapy with disease progression currently on Bev/Atezo. Last dose on 03/26/2020. Next chemo was planned for 9/22 @ [facility] but this has been postponed because of recent acute illness.  He has a right lower quadrant percutaneous drain for drainage of ascitic fluid 3 times a week on Tuesdays, Thursdays and Saturdays.  He was also treated with lactulose and rifaximin for hepatic encephalopathy.  He  has portal vein thrombosis but unfortunately he is not a candidate for anticoagulation because of severe thrombocytopenia.  He was evaluated by PT and OT recommended further rehabilitation at a skilled nursing facility.    # Liver cirrhosis with hx of hepatic encephalopathy PLAN:  --cont lactulose 20g BID and rifaximin --Ensure 2-3 BM's per day --If pt continues to have too many BM's, will reduce lactulose.  # recurrent ascites status post peritoneal catheter which placed on09/01 --was scheduled to drain 3 times a week on Tuesdays, Thursdays and Saturdays.  Up to 3L.   PLAN: --cont lasix and aldactone (schedule for earlier in the day) --would change scheduled drain to PRN if pt experiences discomfort, to reduce fluid and protein loss, nursing updated on this plan.  # SBP, POA, treated Ascitic fluid WBC was consistent with SBP and ascitic fluid culture showed Streptococcus mitis/oralis. He completed 5-day course of IV Rocephin on 05/29/2020.  No abdominal pain. PLAN: --cont Cipro for SBP ppx  mixed hepatocellular carcinoma/cholangiocarcinoma --pt on home avatrombopag.  Hospital doesn't have this medication.  Family unable to bring it to the hospital. --outpatient f/u with [facility].  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 --reduce lab checks to every other day to minimize blood loss   DVT prophylaxis: SCD/Compression stockings Code Status: Full code  Family Communication: friend updated at the bedside today Status is: inpatient Dispo:   The patient is from: home Anticipated d/c is to: SNF near [facility] Anticipated d/c date is: when bed available Patient currently is medically stable to d/c.   Subjective and Interval History:  For the past day,  pt had many episodes of BM's.   This morning, pt complained of nausea again.  No  abdominal pain.   Objective: Vitals:   06/10/20 0435 06/10/20 0828 06/10/20 1143 06/10/20 1609  BP: 98/61 (!) 129/46 119/64 117/86  Pulse: 91 95 89 86  Resp: 18 20 16    Temp: 97.6 F (36.4 C)  98 F (36.7 C) (!) 97.5 F (36.4 C)  TempSrc: Oral  Oral Oral  SpO2: 92% 100% 91% 95%  Weight:      Height:        Intake/Output Summary (Last 24 hours) at 06/10/2020 1705 Last data filed at 06/10/2020 0445 Gross per 24 hour  Intake 120 ml  Output 200 ml  Net -80 ml   Filed Weights   05/30/20 0422 05/31/20 0332 06/02/20 0257  Weight: 71.5 kg 71.5 kg 71.4 kg    Examination:   Constitutional: NAD, AAOx3, more lethargic today HEENT: conjunctivae and lids normal, EOMI CV: RRR no M,R,G. Distal pulses +2.  No cyanosis.   RESP: CTA B/L, normal respiratory effort  GI: +BS, abdomen full but soft, cath present Extremities: No effusions, edema in BLE SKIN: warm, dry and intact Neuro: II - XII grossly intact.  Sensation intact Psych: flat mood and affect.     Data Reviewed: I have personally reviewed following labs and imaging studies  CBC: Recent Labs  Lab 06/06/20 0458 06/07/20 0530 06/08/20 0435 06/09/20 0535 06/10/20 0502  WBC 8.7 8.2 7.9 10.8* 9.9  NEUTROABS  --  6.2 6.0  --   --   HGB 11.7* 11.8* 11.9* 13.5 13.3  HCT 36.8* 35.5* 36.6* 41.7 40.4  MCV 97.4 93.7 97.1 97.0 96.0  PLT 18* 16* 17* 14* 16*   Basic Metabolic Panel: Recent Labs  Lab 06/06/20 1431 06/07/20 0530 06/08/20 0435 06/09/20 0535 06/10/20 0502  NA 132* 133* 135 136 137  K 5.1 5.1 4.7 4.7 3.9  CL 96* 95* 96* 95* 95*  CO2 27 29 29 27 31   GLUCOSE 122* 100* 114* 84 103*  BUN 40* 39* 36* 33* 31*  CREATININE 0.95 1.14 1.06 1.09 1.13  CALCIUM 8.3* 8.5* 8.3* 8.9 8.8*  MG  --  2.2  --  2.2 2.2   GFR: Estimated Creatinine Clearance: 66.7 mL/min (by C-G formula based on SCr of 1.13 mg/dL). Liver Function Tests: No results for input(s): AST, ALT, ALKPHOS, BILITOT, PROT, ALBUMIN in the last 168  hours. No results for input(s): LIPASE, AMYLASE in the last 168 hours. Recent Labs  Lab 06/05/20 0536  AMMONIA 36*   Coagulation Profile: No results for input(s): INR, PROTIME in the last 168 hours. Cardiac Enzymes: No results for input(s): CKTOTAL, CKMB, CKMBINDEX, TROPONINI in the last 168 hours. BNP (last 3 results) No results for input(s): PROBNP in the last 8760 hours. HbA1C: No results for input(s): HGBA1C in the last 72 hours. CBG: No results for input(s): GLUCAP in the last 168 hours. Lipid Profile: No results for input(s): CHOL, HDL, LDLCALC, TRIG, CHOLHDL, LDLDIRECT in the last 72 hours. Thyroid Function Tests: No results for input(s): TSH, T4TOTAL, FREET4, T3FREE, THYROIDAB in the last 72 hours. Anemia Panel: No results for input(s): VITAMINB12, FOLATE, FERRITIN, TIBC, IRON, RETICCTPCT in the last 72 hours. Sepsis Labs: No results for input(s): PROCALCITON, LATICACIDVEN in the last 168 hours.  No results found for this or any previous visit (from the past 240 hour(s)).    Radiology Studies: No results found.   Scheduled Meds:  baclofen  10 mg Oral  TID   ciprofloxacin  500 mg Oral Daily   collagenase   Topical Daily   feeding supplement (ENSURE ENLIVE)  237 mL Oral TID BM   folic acid  1 mg Oral Daily   furosemide  40 mg Oral BID   lactulose  20 g Oral BID   loratadine  10 mg Oral Daily   magnesium oxide  400 mg Oral Daily   morphine  15 mg Oral Q12H   [START ON 06/11/2020] multivitamin with minerals  1 tablet Oral Daily   pantoprazole  40 mg Oral Daily   rifaximin  550 mg Oral BID   sodium chloride flush  3 mL Intravenous Q12H   spironolactone  25 mg Oral Daily   Continuous Infusions:  sodium chloride     sodium chloride Stopped (06/04/2020 2338)     LOS: 16 days     Enzo Bi, MD [facility] If 7PM-7AM, please contact night-coverage 06/10/2020, 5:05 PM

## 2020-06-11 DIAGNOSIS — G893 Neoplasm related pain (acute) (chronic): Secondary | ICD-10-CM | POA: Diagnosis not present

## 2020-06-11 DIAGNOSIS — C221 Intrahepatic bile duct carcinoma: Secondary | ICD-10-CM | POA: Diagnosis not present

## 2020-06-11 DIAGNOSIS — C220 Liver cell carcinoma: Secondary | ICD-10-CM | POA: Diagnosis not present

## 2020-06-11 DIAGNOSIS — K652 Spontaneous bacterial peritonitis: Secondary | ICD-10-CM | POA: Diagnosis not present

## 2020-06-11 LAB — MAGNESIUM: Magnesium: 2 mg/dL (ref 1.7–2.4)

## 2020-06-11 LAB — CBC
HCT: 38.3 % — ABNORMAL LOW (ref 39.0–52.0)
Hemoglobin: 12.8 g/dL — ABNORMAL LOW (ref 13.0–17.0)
MCH: 31.7 pg (ref 26.0–34.0)
MCHC: 33.4 g/dL (ref 30.0–36.0)
MCV: 94.8 fL (ref 80.0–100.0)
Platelets: 18 10*3/uL — CL (ref 150–400)
RBC: 4.04 MIL/uL — ABNORMAL LOW (ref 4.22–5.81)
RDW: 20.8 % — ABNORMAL HIGH (ref 11.5–15.5)
WBC: 12.3 10*3/uL — ABNORMAL HIGH (ref 4.0–10.5)
nRBC: 0 % (ref 0.0–0.2)

## 2020-06-11 LAB — BASIC METABOLIC PANEL
Anion gap: 12 (ref 5–15)
BUN: 36 mg/dL — ABNORMAL HIGH (ref 8–23)
CO2: 29 mmol/L (ref 22–32)
Calcium: 8.5 mg/dL — ABNORMAL LOW (ref 8.9–10.3)
Chloride: 92 mmol/L — ABNORMAL LOW (ref 98–111)
Creatinine, Ser: 1.11 mg/dL (ref 0.61–1.24)
GFR calc non Af Amer: >60 mL/min (ref >60)
Glucose, Bld: 108 mg/dL — ABNORMAL HIGH (ref 70–99)
Potassium: 4.5 mmol/L (ref 3.5–5.1)
Sodium: 133 mmol/L — ABNORMAL LOW (ref 135–145)

## 2020-06-11 MED ORDER — ALUM & MAG HYDROXIDE-SIMETH 200-200-20 MG/5ML PO SUSP: 30.0000 mL | ORAL | Status: DC | PRN

## 2020-06-11 MED ORDER — HYDROMORPHONE HCL 1 MG/ML IJ SOLN
1.0000 mg | Freq: Once | INTRAMUSCULAR | Status: AC
  Administered 2020-06-11: 1 mg via INTRAVENOUS
  Filled 2020-06-11: qty 1

## 2020-06-11 NOTE — NC FL2 (Signed)
[place] LEVEL OF CARE SCREENING TOOL     IDENTIFICATION  Patient Name: John [name] Birthdate: [birth year] Sex: male Admission Date (Current Location): 05/09/2020  [facility] and [place] Number:  Engineering geologist and Address:  [facility], [address], [place], [place] [address]      Provider Number: [id number]  Attending Physician Name and Address:  Ezekiel Slocumb, DO  Relative Name and Phone Number:       Current Level of Care: Hospital Recommended Level of Care: [place] Prior Approval Number:    Date Approved/Denied:   PASRR Number: [id number] E. Expires 10/31.  Discharge Plan: SNF    Current Diagnoses: Patient Active Problem List   Diagnosis Date Noted  . Protein-calorie malnutrition, severe 06/10/2020  . Pressure injury of skin 05/29/2020  . SBP (spontaneous bacterial peritonitis) ([place]) 06/05/2020  . Portal vein thrombosis 05/16/2020  . AKI (acute kidney injury) ([place]) 05/08/2020  . Pharmacologic therapy 02/27/2020  . Disorder of skeletal system 02/27/2020  . Problems influencing health status 02/27/2020  . Fever and chills 11/02/2019  . Epistaxis 11/02/2019  . 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 Cancer associated pain 01/04/2019  . Chronic abdominal pain, right upper quadrant (RUQ) 10/13/2018  . Cholangiocarcinoma ([place]) 07/07/2018  . Liver mass, right lobe 04/04/2018  . Goals of care, counseling/discussion 12/03/2017  . Nodule of upper lobe of left lung 12/02/2017  . History of brain tumor 07/07/2017  . De Quervain's tenosynovitis, left 07/07/2017  . Chronic thumb pain (Left) 06/30/2017  . Chronic pain syndrome 10/05/2016  . Neurogenic pain 01/21/2016  . Musculoskeletal pain 01/21/2016  . Chronic lower extremity pain (Secondary area of Pain) (Bilateral) (L>R) 01/21/2016  . Chronic upper extremity  pain (Fourth area of Pain) (Bilateral) (R>L) 01/21/2016  . Abnormal MRI, lumbar spine (2015) 01/21/2016  . Lumbar central spinal stenosis (Severe at L4-5; L5-S1) 01/21/2016  . Lumbar lateral recess stenosis (Bilateral L>R at L4-5; Left sided at L5-S1) 01/21/2016  . Lumbar foraminal stenosis (Left L5-S1) 01/21/2016  . Lumbar facet hypertrophy (L4-5 & L5-S1 L>R) 01/21/2016  . Abnormal MRI, cervical spine (2014) 01/21/2016  . Cervical foraminal stenosis (Right sided C3-4 & C4-5) (Left-sided C5-6 and C7-T1) (Bilateral C6-7) 01/21/2016  . Personal history of tobacco use, presenting hazards to health 01/09/2016  . Opioid-induced constipation (OIC) 11/18/2015  . Long term prescription opiate use 09/22/2015  . Encounter for chronic pain management 09/22/2015  . Radicular pain of shoulder (Bilateral) (R>L) 09/22/2015  . Encounter for therapeutic drug level monitoring 06/23/2015  . Long term current use of opiate analgesic 06/23/2015  . Opiate use (7.5 MME/Day) 06/23/2015  . Lumbar spondylosis 06/23/2015  . Chronic low back pain (Primary Area of Pain) (Bilateral) (L>R) 06/23/2015  . IVP/radiological dye Allergy 06/23/2015  . Thrombocytopenia ([place]) 06/23/2015  . History of alcoholism 06/23/2015  . Depression 06/23/2015  . Dystonia 06/23/2015  . Gastric ulcer 06/23/2015  . HCV (hepatitis C virus) 06/23/2015  . H/O neoplasm 06/23/2015  . BP (high blood pressure) 06/23/2015  . Decreased leukocytes 06/23/2015  . Hepatic cirrhosis ([place]) 06/23/2015  . Current tobacco use 06/23/2015  . Has a tremor 06/23/2015  . Osteoarthritis of spine with radiculopathy, lumbosacral region 06/23/2015  . Peripheral neuropathy (Alcoh[place]) 88/50/2774  . Chronic neck pain (Third area of Pain) (Bilateral) (R>L) 06/23/2015  . Cervical spondylosis 06/23/2015  . Obstructive sleep apnea 06/23/2015  . History of panic attacks  06/23/2015  . GERD (gastroesophageal reflux disease) 06/23/2015  . Chronic constipation  06/23/2015  . 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[place]) 06/23/2015  . Leukopenia 06/23/2015  . Lumbar facet syndrome (Bilateral) (L>R) 06/23/2015  . Chronic lumbar radicular pain (Right S1 and Left L5) (Bilateral) (L>R) 06/23/2015  . Cervical radicular pain (Bilateral) (R>L) 06/23/2015  . Chronic obstructive pulmonary disease (COPD) ([place]) 06/23/2015  . Head injury 06/23/2015  . Epileptic disorder ([place]) 06/23/2015  . Carpal tunnel syndrome 01/07/2015  . Bilateral carpal tunnel syndrome 01/07/2015  . Foot pain, right 09/11/2014  . Difficulty in walking 05/06/2014  . Loss of feeling or sensation 05/06/2014  . Absence of sensation 05/06/2014  . Back pain 05/06/2014  . Splenomegaly 05/30/2013    Orientation RESPIRATION BLADDER Height & Weight     Self, Time, Situation, Place  Normal Continent, External catheter Weight: 157 lb 5.1 oz (71.4 kg) Height:  6' (182.9 cm)  BEHAVIORAL SYMPTOMS/MOOD NEUROLOGICAL BOWEL NUTRITION STATUS   (None)  (Epileptic disorder) Continent Diet (2 gram sodium)  AMBULATORY STATUS COMMUNICATION OF NEEDS Skin   Extensive Assist Verbally Bruising, Other (Comment), PU Stage and Appropriate Care (Drain. Non-pressure wound on right lower abdomen: Gauze.)   PU Stage 2 Dressing: Daily (Sacrum: Foam.)               Personal Care Assistance Level of Assistance  Bathing, Feeding, Dressing Bathing Assistance: Limited assistance Feeding assistance: Limited assistance Dressing Assistance: Limited assistance     Functional Limitations Info  Sight, Hearing, Speech Sight Info: Adequate Hearing Info: Adequate Speech Info: Adequate    SPECIAL CARE FACTORS FREQUENCY  PT (By licensed PT), OT (By licensed OT)     PT Frequency: 5 x week OT Frequency: 5 x week            Contractures Contractures Info: Not present    Additional Factors Info  Code Status, Allergies, Psychotropic Code Status Info: Full code Allergies Info: Codeine, Morphine, Duloxetine,  Oxycodone. Psychotropic Info: Depression: No scheduled psychotropic meds.         Current Medications (06/11/2020):  This is the current hospital active medication list Current Facility-Administered Medications  Medication Dose Route Frequency Provider Last Rate Last Admin  . 0.9 %  sodium chloride infusion  250 mL Intravenous PRN Agbata, Tochukwu, MD      . 0.9 %  sodium chloride infusion   Intravenous PRN Collier Bullock, MD   Stopped at 05/17/2020 2338  . ALPRAZolam Duanne Moron) tablet 0.25 mg  0.25 mg Oral QHS PRN Agbata, Tochukwu, MD   0.25 mg at 06/10/20 2018  . alum & mag hydroxide-simeth (MAALOX/MYLANTA) 200-200-20 MG/5ML suspension 30 mL  30 mL Oral Q4H PRN Agbata, Tochukwu, MD      . baclofen (LIORESAL) tablet 10 mg  10 mg Oral TID Agbata, Tochukwu, MD   10 mg at 06/11/20 1002  . ciprofloxacin (CIPRO) tablet 500 mg  500 mg Oral Daily Nicole Kindred A, DO   500 mg at 06/11/20 1002  . collagenase (SANTYL) ointment   Topical Daily Gwynne Edinger, MD   Given at 06/11/20 1002  . dextromethorphan-guaiFENesin (MUCINEX DM) 30-600 MG per 12 hr tablet 1 tablet  1 tablet Oral BID PRN Lang Snow, NP   1 tablet at 05/28/20 0530  . feeding supplement (ENSURE ENLIVE) (ENSURE ENLIVE) liquid 237 mL  237 mL Oral TID BM Enzo Bi, MD   237 mL at 06/11/20 1003  . folic acid (FOLVITE) tablet 1 mg  1 mg Oral Daily  Collier Bullock, MD   1 mg at 06/11/20 1002  . furosemide (LASIX) tablet 40 mg  40 mg Oral BID Enzo Bi, MD   40 mg at 06/10/20 1025  . lactulose (CHRONULAC) 10 GM/15ML solution 20 g  20 g Oral BID Gwynne Edinger, MD   20 g at 06/11/20 1001  . magic mouthwash  5 mL Oral QID PRN Nicole Kindred A, DO       And  . lidocaine (XYLOCAINE) 2 % viscous mouth solution 5 mL  5 mL Mouth/Throat QID PRN Nicole Kindred A, DO      . loratadine (CLARITIN) tablet 10 mg  10 mg Oral Daily Agbata, Tochukwu, MD   10 mg at 06/11/20 1002  . LORazepam (ATIVAN) injection 2 mg  2 mg Intravenous Q4H  PRN Agbata, Tochukwu, MD      . magnesium oxide (MAG-OX) tablet 400 mg  400 mg Oral Daily Agbata, Tochukwu, MD   400 mg at 06/11/20 1003  . morphine (MS CONTIN) 12 hr tablet 15 mg  15 mg Oral Q12H Griffith, Kelly A, DO   15 mg at 06/11/20 1002  . multivitamin with minerals tablet 1 tablet  1 tablet Oral Daily Enzo Bi, MD   1 tablet at 06/11/20 1002  . ondansetron (ZOFRAN) tablet 4 mg  4 mg Oral Q6H PRN Agbata, Tochukwu, MD   4 mg at 06/08/20 1226   Or  . ondansetron (ZOFRAN) injection 4 mg  4 mg Intravenous Q6H PRN Agbata, Tochukwu, MD   4 mg at 06/04/20 0056  . pantoprazole (PROTONIX) EC tablet 40 mg  40 mg Oral Daily Agbata, Tochukwu, MD   40 mg at 06/11/20 1002  . rifaximin (XIFAXAN) tablet 550 mg  550 mg Oral BID [name], MD   550 mg at 06/11/20 1002  . sodium chloride flush (NS) 0.9 % injection 3 mL  3 mL Intravenous Q12H Agbata, Tochukwu, MD   3 mL at 06/11/20 1003  . sodium chloride flush (NS) 0.9 % injection 3 mL  3 mL Intravenous PRN Agbata, Tochukwu, MD   3 mL at 06/02/20 0547  . spironolactone (ALDACTONE) tablet 25 mg  25 mg Oral Daily Gwynne Edinger, MD   25 mg at 06/11/20 1002     Discharge Medications: Please see discharge summary for a list of discharge medications.  Relevant Imaging Results:  Relevant Lab Results:   Additional Information SS#: [id number]. Has a right lower quadrant percutaneous drain. Gets chemo at [facility] every 2-3 weeks. Patient now agreeable to having his treatment transferred to [facility].  Candie Chroman, LCSW

## 2020-06-11 NOTE — Progress Notes (Signed)
PROGRESS NOTE    John [name]   MPN:[id number]  DOB: [birth year]  PCP: Cletis Athens, MD    DOA: 05/18/2020 LOS: 13   Brief Narrative   Summary from Dr. Billie Ruddy 10/5: "John [name] is a 64 y.o. 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      Patient receives all his oncological care at [facility] and had requested transfer to [facility].  However, there were no beds available so he was managed at [facility].   He was found to have spontaneous bacterial peritonitis.  Ascitic fluid WBC was consistent with SBP and ascitic fluid culture showed Streptococcus mitis/oralis. He completed 5-day course of IV Rocephin on 05/29/2020.  He was previously on ciprofloxacin for SBP prophylaxis and this has been resumed.   He has mixed hepatocellular carcinoma/cholangiocarcinoma and he is status post Y 90 radioembolization in 2019 followed by Gem/Cis and capecitabine monotherapy with disease progression currently on Bev/Atezo. Last dose on 03/26/2020.  Next chemo was planned for 9/22 @ [facility] but this has been postponed because of recent acute illness.  He has a right lower quadrant percutaneous drain for drainage of ascitic fluid 3 times a week on Tuesdays, Thursdays and Saturdays.   He was also treated with lactulose and rifaximin for hepatic encephalopathy.  He has portal vein thrombosis but unfortunately he is not a candidate for anticoagulation because of severe thrombocytopenia.   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  Recurrent ascites status post peritoneal catheter which placed on09/01 --scheduled to drain 3 times a week on Tuesdays, Thursdays and Saturdays.  Up to 3L.   --cont lasix and aldactone  --would change scheduled drain to PRN if pt experiences discomfort, to reduce fluid and protein loss, nursing updated on this plan.  SBP, POA, treated & resolved - Ascitic fluid WBC was consistent with SBP and ascitic fluid culture showed Streptococcus mitis/oralis.  He completed 5-day course of IV Rocephin on 05/29/2020. No abdominal pain. --cont Cipro for SBP ppx  Mixed hepatocellular carcinoma/cholangiocarcinoma - has been followed at [facility]. --pt on home avatrombopag.  Hospital doesn't have this medication.  Family unable to bring it to the hospital. Patient now agreeable to have oncology care transferred here, locally.  Dr. Grayland Ormond notified and will arrange follow up in [place] after discharge.  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 --reduce lab checks to every other day to minimize blood loss    DVT prophylaxis: Place and maintain sequential compression device Start: 05/26/20 0823 SCDs Start:  05/11/2020 0935   Diet:  Diet Orders (From admission, onward)    Start     Ordered   05/26/2020 0936  Diet 2 gram sodium Room service appropriate? Yes; Fluid  consistency: Thin  Diet effective now       Question Answer Comment  Room service appropriate? Yes   Fluid consistency: Thin      06/03/2020 0936            Code Status: Full Code    Subjective 06/11/20    Patient seen with brother at bedside this AM.  Pt reports from chest / epigastric pain that was severe overnight, present but mild this AM.  No Sob, N/V, abdominal pain.  Complains about taking lactulose, says had 10 BM's overnight (brother shakes head no, unclear if reliable history from patient).     Disposition Plan & Communication   Status is: Inpatient  Remains inpatient appropriate because:SNF placement pending, pt too weak to safely return home   Dispo:  Patient From: Home  Planned Disposition: [place]  Expected discharge date: 06/06/20 (SNF's declining (pt on chemo at [facility] will need transport))  Medically stable for discharge: Yes         Family Communication: brother at bedside on rounds    Consults, Procedures, Significant Events   Consultants:    Procedures:     Antimicrobials:  Anti-infectives (From admission, onward)   Start     Dose/Rate Route Frequency Ordered Stop   06/03/20 1000  ciprofloxacin (CIPRO) tablet 500 mg        500 mg Oral Daily 06/03/20 0748     05/29/20 1300  cefTRIAXone (ROCEPHIN) 2 g in sodium chloride 0.9 % 100 mL IVPB        2 g 200 mL/hr over 30 Minutes Intravenous  Once 05/28/20 1354 05/29/20 1323   05/27/20 1600  rifaximin (XIFAXAN) tablet 550 mg        550 mg Oral 2 times daily 05/27/20 1506     05/27/20 0900  cefTRIAXone (ROCEPHIN) 2 g in sodium chloride 0.9 % 100 mL IVPB  Status:  Discontinued        2 g 200 mL/hr over 30 Minutes Intravenous Every 24 hours 05/26/20 1149 05/28/20 1302   05/29/2020 1800  cefTRIAXone (ROCEPHIN) 2 g in sodium chloride 0.9 % 100 mL IVPB  Status:  Discontinued        2 g 200 mL/hr over 30 Minutes Intravenous Every 12 hours 05/20/2020 0936 05/26/20 1149   06/05/2020 0615   cefTRIAXone (ROCEPHIN) 2 g in sodium chloride 0.9 % 100 mL IVPB        2 g 200 mL/hr over 30 Minutes Intravenous  Once 05/20/2020 0604 05/19/2020 0736        Objective   Vitals:   06/10/20 1609 06/10/20 2127 06/11/20 0318 06/11/20 0758  BP: 117/86 95/62 (!) 130/55 112/60  Pulse: 86 86 79 86  Resp:  18 18   Temp: (!) 97.5 F (36.4 C) 98 F (36.7 C) 97.6 F (36.4 C) 97.7 F (36.5 C)  TempSrc: Oral Oral Oral Oral  SpO2: 95% 98% 96% 95%  Weight:      Height:        Intake/Output Summary (Last 24 hours) at 06/11/2020 0822 Last data filed at 06/11/2020 0444 Gross per 24 hour  Intake 360 ml  Output 950 ml  Net -590 ml   Filed Weights   05/30/20 0422 05/31/20 0332 06/02/20 0257  Weight: 71.5 kg  71.5 kg 71.4 kg    Physical Exam:  General exam: awake, alert, no acute distress, cachectic Respiratory system: CTAB, normal respiratory effort. Cardiovascular system: normal S1/S2, RRR, no pedal edema.   Gastrointestinal system: soft, NT, ND, +bowel sounds. Central nervous system: A&O x3. no gross focal neurologic deficits, normal speech Psychiatry: irritable mood, congruent affect, judgement and insight appear normal  Labs   Data Reviewed: I have personally reviewed following labs and imaging studies  CBC: Recent Labs  Lab 06/06/20 0458 06/07/20 0530 06/08/20 0435 06/09/20 0535 06/10/20 0502  WBC 8.7 8.2 7.9 10.8* 9.9  NEUTROABS  --  6.2 6.0  --   --   HGB 11.7* 11.8* 11.9* 13.5 13.3  HCT 36.8* 35.5* 36.6* 41.7 40.4  MCV 97.4 93.7 97.1 97.0 96.0  PLT 18* 16* 17* 14* 16*   Basic Metabolic Panel: Recent Labs  Lab 06/06/20 1431 06/07/20 0530 06/08/20 0435 06/09/20 0535 06/10/20 0502  NA 132* 133* 135 136 137  K 5.1 5.1 4.7 4.7 3.9  CL 96* 95* 96* 95* 95*  CO2 27 29 29 27 31   GLUCOSE 122* 100* 114* 84 103*  BUN 40* 39* 36* 33* 31*  CREATININE 0.95 1.14 1.06 1.09 1.13  CALCIUM 8.3* 8.5* 8.3* 8.9 8.8*  MG  --  2.2  --  2.2 2.2   GFR: Estimated Creatinine  Clearance: 66.7 mL/min (by C-G formula based on SCr of 1.13 mg/dL). Liver Function Tests: No results for input(s): AST, ALT, ALKPHOS, BILITOT, PROT, ALBUMIN in the last 168 hours. No results for input(s): LIPASE, AMYLASE in the last 168 hours. Recent Labs  Lab 06/05/20 0536  AMMONIA 36*   Coagulation Profile: No results for input(s): INR, PROTIME in the last 168 hours. Cardiac Enzymes: No results for input(s): CKTOTAL, CKMB, CKMBINDEX, TROPONINI in the last 168 hours. BNP (last 3 results) No results for input(s): PROBNP in the last 8760 hours. HbA1C: No results for input(s): HGBA1C in the last 72 hours. CBG: No results for input(s): GLUCAP in the last 168 hours. Lipid Profile: No results for input(s): CHOL, HDL, LDLCALC, TRIG, CHOLHDL, LDLDIRECT in the last 72 hours. Thyroid Function Tests: No results for input(s): TSH, T4TOTAL, FREET4, T3FREE, THYROIDAB in the last 72 hours. Anemia Panel: No results for input(s): VITAMINB12, FOLATE, FERRITIN, TIBC, IRON, RETICCTPCT in the last 72 hours. Sepsis Labs: No results for input(s): PROCALCITON, LATICACIDVEN in the last 168 hours.  No results found for this or any previous visit (from the past 240 hour(s)).    Imaging Studies   No results found.   Medications   Scheduled Meds: . baclofen  10 mg Oral TID  . ciprofloxacin  500 mg Oral Daily  . collagenase   Topical Daily  . feeding supplement (ENSURE ENLIVE)  237 mL Oral TID BM  . folic acid  1 mg Oral Daily  . furosemide  40 mg Oral BID  . lactulose  20 g Oral BID  . loratadine  10 mg Oral Daily  . magnesium oxide  400 mg Oral Daily  . morphine  15 mg Oral Q12H  . multivitamin with minerals  1 tablet Oral Daily  . pantoprazole  40 mg Oral Daily  . rifaximin  550 mg Oral BID  . sodium chloride flush  3 mL Intravenous Q12H  . spironolactone  25 mg Oral Daily   Continuous Infusions: . sodium chloride    . sodium chloride Stopped (05/24/2020 2338)       LOS: 17 days  Time spent: 30 minutes    Ezekiel Slocumb, DO [facility]  06/11/2020, 8:22 AM    If 7PM-7AM, please contact night-coverage. How to contact the [facility] Attending or Consulting provider [place] or covering provider during after hours [place], for this patient?    1. Check the care team in [facility] and look for a) attending/consulting TRH provider listed and b) the [facility] team listed 2. Log into [email or web address] and use [place]'s universal password to access. If you do not have the password, please contact the hospital operator. 3. Locate the [facility] provider you are looking for under [facility] and page to a number that you can be directly reached. 4. If you still have difficulty reaching the provider, please page the [facility] (Director on Call) for the Hospitalists listed on amion for assistance.

## 2020-06-11 NOTE — Progress Notes (Signed)
Physical Therapy Treatment Patient Details Name: John [name] MRN: [id number] DOB: 10/01/1955 Today's Date: 06/11/2020    History of Present Illness 64 y.o. male with medical history significant for hepatocellular carcinoma and cholangiocarcinoma on bevacizumab/atezolizumab complicated by recurrent ascites status post peritoneal catheter which was placed on 09/01, history of chronic alcoholic pancreatitis, hepatitis B, COPD, seizure disorder who presented to the ED via EMS for evaluation of worsening abdominal pain    PT Comments    Pt received sitting on edge of bed alone in room with body tremors noted to be greater today. Pt upset about having chest pain and rough night and having to spend birthday in the hospital. Pt convinced to return to bed and required CGA for sit <> stand and for taking side steps at bedside for improved positioning before returning to bed. Pt performed bed mobility with supervision only and required no external physical assist. Pt reports that he is unable to do exercises today because of his current "chest" and back pain, with pt rubbing  epigastric area. Pt left lying in bed with all needs within reach and bed alarm set. Will continue to follow and progress pt as able/tolerated.   Follow Up Recommendations  SNF;Supervision - Intermittent     Equipment Recommendations  Rolling walker with 5" wheels    Recommendations for Other Services       Precautions / Restrictions Precautions Precautions: Fall Restrictions Weight Bearing Restrictions: No    Mobility  Bed Mobility Overal bed mobility: Needs Assistance Bed Mobility: Sit to Supine       Sit to supine: HOB elevated;Supervision   General bed mobility comments: pt requires increased time to perform sit to supine, requires assistance to get blankets situated correctly  Transfers Overall transfer level: Needs assistance Equipment used: Rolling walker (2 wheeled) Transfers: Sit to/from Stand Sit  to Stand: Min guard         General transfer comment: CGA for sit <> stand transfer for steadying  Ambulation/Gait Ambulation/Gait assistance: Min guard Gait Distance (Feet): 3 Feet Assistive device: Rolling walker (2 wheeled) Gait Pattern/deviations: Decreased step length - right;Decreased step length - left Gait velocity: decreased   General Gait Details: pt side stepped 3 feet using RW with CGA for steadying   Stairs             Wheelchair Mobility    Modified Rankin (Stroke Patients Only)       Balance Overall balance assessment: Needs assistance Sitting-balance support: Bilateral upper extremity supported;Feet supported Sitting balance-Leahy Scale: Fair Sitting balance - Comments: pt with increased in body tremors noted today   Standing balance support: Bilateral upper extremity supported;During functional activity Standing balance-Leahy Scale: Fair Standing balance comment: CGA for balance                            Cognition Arousal/Alertness: Awake/alert Behavior During Therapy: WFL for tasks assessed/performed Overall Cognitive Status: No family/caregiver present to determine baseline cognitive functioning                                        Exercises      General Comments        Pertinent Vitals/Pain Pain Assessment: Faces Faces Pain Scale: Hurts even more Pain Location: chest and back Pain Descriptors / Indicators: Aching;Discomfort;Grimacing Pain Intervention(s): Monitored during session    Home  Living                      Prior Function            PT Goals (current goals can now be found in the care plan section) Acute Rehab PT Goals Patient Stated Goal: to get comfortable PT Goal Formulation: With patient Time For Goal Achievement: 06/13/20 Potential to Achieve Goals: Fair Progress towards PT goals: Progressing toward goals    Frequency    Min 2X/week      PT Plan Current plan  remains appropriate    Co-evaluation              AM-PAC PT "6 Clicks" Mobility   Outcome Measure  Help needed turning from your back to your side while in a flat bed without using bedrails?: A Little Help needed moving from lying on your back to sitting on the side of a flat bed without using bedrails?: A Lot Help needed moving to and from a bed to a chair (including a wheelchair)?: A Little Help needed standing up from a chair using your arms (e.g., wheelchair or bedside chair)?: A Little Help needed to walk in hospital room?: A Little Help needed climbing 3-5 steps with a railing? : A Lot 6 Click Score: 16    End of Session   Activity Tolerance: Patient limited by fatigue Patient left: in bed;with call bell/phone within reach;with bed alarm set Nurse Communication: Mobility status PT Visit Diagnosis: Unsteadiness on feet (R26.81);Other abnormalities of gait and mobility (R26.89);Muscle weakness (generalized) (M62.81);Pain     Time: 1028-1040 PT Time Calculation (min) (ACUTE ONLY): 12 min  Charges:                        Vale Haven, SPT   Vale Haven 06/11/2020, 11:17 AM

## 2020-06-11 NOTE — Progress Notes (Signed)
John [name] from lab called and stated patient had a critical platelet value of 18. MD notified.

## 2020-06-11 NOTE — TOC Progression Note (Addendum)
Transition of Care [facility]) - Progression Note    Patient Details  Name: John [name] MRN: [id number] Date of Birth: 1956/07/16  Transition of Care [facility]) CM/SW [place], LCSW Phone Number: 06/11/2020, 10:50 AM  Clinical Narrative: [facility] and [facility] in [place] are unable to make a bed offer.    12:44 pm: Per MD, patient now agreeable to transferring his chemo treatments to [place]. CSW updated FL2 and included this information. Resent referral to local SNF's.  Expected Discharge Plan: [place] Barriers to Discharge: No SNF bed, Insurance Authorization  Expected Discharge Plan and Services Expected Discharge Plan: [place] In-house Referral: Clinical Social Work   Post Acute Care Choice: [place] Living arrangements for the past 2 months: Single Family Home                                       Social Determinants of Health (SDOH) Interventions    Readmission Risk Interventions No flowsheet data found.

## 2020-06-12 DIAGNOSIS — K652 Spontaneous bacterial peritonitis: Secondary | ICD-10-CM | POA: Diagnosis not present

## 2020-06-12 DIAGNOSIS — G893 Neoplasm related pain (acute) (chronic): Secondary | ICD-10-CM | POA: Diagnosis not present

## 2020-06-12 DIAGNOSIS — C220 Liver cell carcinoma: Secondary | ICD-10-CM | POA: Diagnosis not present

## 2020-06-12 DIAGNOSIS — D696 Thrombocytopenia, unspecified: Secondary | ICD-10-CM | POA: Diagnosis not present

## 2020-06-12 LAB — BODY FLUID CELL COUNT WITH DIFFERENTIAL
Eos, Fluid: 0 %
Lymphs, Fluid: 15 %
Monocyte-Macrophage-Serous Fluid: 21 %
Neutrophil Count, Fluid: 64 %
Total Nucleated Cell Count, Fluid: 2214 cu mm

## 2020-06-12 LAB — AMMONIA: Ammonia: 27 umol/L (ref 9–35)

## 2020-06-12 MED ORDER — HYDROMORPHONE HCL 1 MG/ML IJ SOLN
0.5000 mg | Freq: Once | INTRAMUSCULAR | Status: AC
  Administered 2020-06-12: 0.5 mg via INTRAVENOUS
  Filled 2020-06-12: qty 0.5

## 2020-06-12 NOTE — Progress Notes (Signed)
OT Cancellation Note  Patient Details Name: John [name] MRN: [id number] DOB: Sep 18, 1955   Cancelled Treatment:    Reason Eval/Treat Not Completed: Patient declined, no reason specified. Upon attempt, pt finishing breakfast. PT agreeable to therapist removing breakfast tray from table. When offered if he would like to use the bathroom after pt mentioned it, he very aggressively stated he was not done with breakfast and "can't I just get a break?" Will re-attempt next date as schedule permits and as pt is willing to participate.   Jeni Salles, MPH, MS, OTR/L ascom [phone] 06/12/20, 9:32 AM

## 2020-06-12 NOTE — Progress Notes (Addendum)
PROGRESS NOTE    ANGELES PAOLUCCI   GEX:[id number]  DOB: [birth year]  PCP: Cletis Athens, MD    DOA: 05/20/2020 LOS: 18   Brief Narrative   Summary from Dr. Billie Ruddy 10/5: "John [name] is a 64 y.o. 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      Patient receives all his oncological care at [facility] and had requested transfer to [facility].  However, there were no beds available so he was managed at [facility].   He was found to have spontaneous bacterial peritonitis.  Ascitic fluid WBC was consistent with SBP and ascitic fluid culture showed Streptococcus mitis/oralis. He completed 5-day course of IV Rocephin on 05/29/2020.  He was previously on ciprofloxacin for SBP prophylaxis and this has been resumed.   He has mixed hepatocellular carcinoma/cholangiocarcinoma and he is status post Y 90 radioembolization in 2019 followed by Gem/Cis and capecitabine monotherapy with disease progression currently on Bev/Atezo. Last dose on 03/26/2020.  Next chemo was planned for 9/22 @ [facility] but this has been postponed because of recent acute illness.  He has a right lower quadrant percutaneous drain for drainage of ascitic fluid 3 times a week on Tuesdays, Thursdays and Saturdays.   He was also treated with lactulose and rifaximin for hepatic encephalopathy.  He has portal vein thrombosis but unfortunately he is not a candidate for anticoagulation because of severe thrombocytopenia.   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  Myoclonic jerks - suspect due to hyperammonemia / metabolic.  Check ammonia level.  Continue lactulose, ensure at least 2-3 soft BM's daily.  Monitor.  Recurrent ascites status post peritoneal catheter which placed on09/01 --scheduled to drain 3 times a week on Tuesdays, Thursdays and Saturdays.  Up to 3L.   --cont lasix and aldactone  --would change scheduled drain to PRN if pt experiences discomfort, to reduce fluid and protein loss, nursing updated on this plan. --drain today - sent fluid for labs/culture  SBP, POA, treated & resolved - Ascitic fluid WBC was consistent with SBP and ascitic fluid culture showed Streptococcus mitis/oralis.  He completed 5-day course of IV Rocephin on 05/29/2020. No abdominal pain. --cont Cipro for SBP ppx --repeat ascites cell count/diff and culture today, given recurrent leukocytosis and abdominal pain  Mixed hepatocellular carcinoma/cholangiocarcinoma - has been followed at [facility]. --pt on home avatrombopag.  Hospital doesn't have this medication.  Family unable to bring it to the hospital. Patient now agreeable to have oncology care transferred here, locally.  Dr. Grayland Ormond notified and will arrange follow up in [place] after discharge.  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 --reduce lab checks to every other day  to minimize blood loss   Pressure injury of Sacrum - undetermined if present on admission. Pressure Injury 05/29/20 Sacrum Stage 2 -  Partial thickness loss of dermis presenting as a shallow open injury with a red, pink wound bed without slough. (Active)  05/29/20 1024  Location: Sacrum  Location Orientation:   Staging: Stage 2 -  Partial thickness loss of dermis presenting as a shallow open injury with a red, pink wound bed without slough.  Wound Description (Comments):   Present on Admission:       DVT prophylaxis: Place and maintain sequential compression device Start: 05/26/20 0823 SCDs Start: 05/18/2020 0935   Diet:  Diet Orders (From admission, onward)    Start     Ordered   05/17/2020 0936  Diet 2 gram sodium Room service appropriate? Yes; Fluid consistency: Thin  Diet effective now       Question Answer Comment  Room service appropriate? Yes   Fluid consistency: Thin      05/27/2020 0936            Code Status: Full Code    Subjective 06/12/20    Patient seen at bedside this AM.  He reports feeling okay.  Reports bothersome jerking of his extremities and worried about this.  Says he has been taking lactulose and having a lot of BM's (will need to confirm).  Has intermittent LLQ pain that comes and goes.  No other acute complaints.      Disposition Plan & Communication   Status is: Inpatient  Remains inpatient appropriate because:SNF placement pending, pt too weak to safely return home   Dispo:  Patient From: Home  Planned Disposition: [place]  Expected discharge date: pending SNF bed  Medically stable for discharge: Yes     Family Communication: none at bedside on rounds, brother updated at bedside on 10/6    Consults, Procedures, Significant Events   Consultants:    Procedures:     Antimicrobials:  Anti-infectives (From admission, onward)   Start     Dose/Rate Route Frequency Ordered Stop   06/03/20 1000  ciprofloxacin (CIPRO)  tablet 500 mg        500 mg Oral Daily 06/03/20 0748     05/29/20 1300  cefTRIAXone (ROCEPHIN) 2 g in sodium chloride 0.9 % 100 mL IVPB        2 g 200 mL/hr over 30 Minutes Intravenous  Once 05/28/20 1354 05/29/20 1323   05/27/20 1600  rifaximin (XIFAXAN) tablet 550 mg        550 mg Oral 2 times daily 05/27/20 1506     05/27/20 0900  cefTRIAXone (ROCEPHIN) 2 g in sodium chloride 0.9 % 100 mL IVPB  Status:  Discontinued        2 g 200 mL/hr over 30 Minutes Intravenous Every 24 hours 05/26/20 1149 05/28/20 1302   05/09/2020 1800  cefTRIAXone (ROCEPHIN) 2 g in sodium chloride 0.9 % 100 mL IVPB  Status:  Discontinued        2 g 200 mL/hr over 30 Minutes Intravenous Every 12 hours 06/05/2020 0936 05/26/20 1149   05/22/2020 0615  cefTRIAXone (ROCEPHIN) 2 g in sodium chloride 0.9 % 100 mL IVPB        2 g 200 mL/hr over 30 Minutes Intravenous  Once 06/05/2020 0604 05/11/2020 0736  Objective   Vitals:   06/11/20 2014 06/11/20 2334 06/12/20 0335 06/12/20 1146  BP: (!) 110/94 90/79 99/60  (!) 110/52  Pulse: (!) 101 90 91 85  Resp: 20 18 18 16   Temp: 97.7 F (36.5 C) 97.9 F (36.6 C) (!) 97.4 F (36.3 C) 97.6 F (36.4 C)  TempSrc:  Oral Oral Oral  SpO2: 97% 99% 98% 98%  Weight:      Height:        Intake/Output Summary (Last 24 hours) at 06/12/2020 1359 Last data filed at 06/12/2020 1021 Gross per 24 hour  Intake 240 ml  Output --  Net 240 ml   Filed Weights   05/30/20 0422 05/31/20 0332 06/02/20 0257  Weight: 71.5 kg 71.5 kg 71.4 kg    Physical Exam:  General exam: awake, alert, no acute distress, cachectic Respiratory system: CTAB, normal respiratory effort. Cardiovascular system: normal S1/S2, RRR, no pedal edema.   Gastrointestinal system: mildly tender, slightly distended, peritoneal drain in place. Extremities: intermittent myoclonic jerks of upper extremities, no asterixis appreciated   Labs   Data Reviewed: I have personally reviewed following labs and imaging  studies  CBC: Recent Labs  Lab 06/07/20 0530 06/08/20 0435 06/09/20 0535 06/10/20 0502 06/11/20 1817  WBC 8.2 7.9 10.8* 9.9 12.3*  NEUTROABS 6.2 6.0  --   --   --   HGB 11.8* 11.9* 13.5 13.3 12.8*  HCT 35.5* 36.6* 41.7 40.4 38.3*  MCV 93.7 97.1 97.0 96.0 94.8  PLT 16* 17* 14* 16* 18*   Basic Metabolic Panel: Recent Labs  Lab 06/07/20 0530 06/08/20 0435 06/09/20 0535 06/10/20 0502 06/11/20 1817  NA 133* 135 136 137 133*  K 5.1 4.7 4.7 3.9 4.5  CL 95* 96* 95* 95* 92*  CO2 29 29 27 31 29   GLUCOSE 100* 114* 84 103* 108*  BUN 39* 36* 33* 31* 36*  CREATININE 1.14 1.06 1.09 1.13 1.11  CALCIUM 8.5* 8.3* 8.9 8.8* 8.5*  MG 2.2  --  2.2 2.2 2.0   GFR: Estimated Creatinine Clearance: 67.9 mL/min (by C-G formula based on SCr of 1.11 mg/dL). Liver Function Tests: No results for input(s): AST, ALT, ALKPHOS, BILITOT, PROT, ALBUMIN in the last 168 hours. No results for input(s): LIPASE, AMYLASE in the last 168 hours. No results for input(s): AMMONIA in the last 168 hours. Coagulation Profile: No results for input(s): INR, PROTIME in the last 168 hours. Cardiac Enzymes: No results for input(s): CKTOTAL, CKMB, CKMBINDEX, TROPONINI in the last 168 hours. BNP (last 3 results) No results for input(s): PROBNP in the last 8760 hours. HbA1C: No results for input(s): HGBA1C in the last 72 hours. CBG: No results for input(s): GLUCAP in the last 168 hours. Lipid Profile: No results for input(s): CHOL, HDL, LDLCALC, TRIG, CHOLHDL, LDLDIRECT in the last 72 hours. Thyroid Function Tests: No results for input(s): TSH, T4TOTAL, FREET4, T3FREE, THYROIDAB in the last 72 hours. Anemia Panel: No results for input(s): VITAMINB12, FOLATE, FERRITIN, TIBC, IRON, RETICCTPCT in the last 72 hours. Sepsis Labs: No results for input(s): PROCALCITON, LATICACIDVEN in the last 168 hours.  No results found for this or any previous visit (from the past 240 hour(s)).    Imaging Studies   No results  found.   Medications   Scheduled Meds: . baclofen  10 mg Oral TID  . ciprofloxacin  500 mg Oral Daily  . collagenase   Topical Daily  . feeding supplement (ENSURE ENLIVE)  237 mL Oral TID BM  . folic acid  1 mg Oral Daily  . furosemide  40 mg Oral BID  . lactulose  20 g Oral BID  . loratadine  10 mg Oral Daily  . magnesium oxide  400 mg Oral Daily  . morphine  15 mg Oral Q12H  . multivitamin with minerals  1 tablet Oral Daily  . pantoprazole  40 mg Oral Daily  . rifaximin  550 mg Oral BID  . sodium chloride flush  3 mL Intravenous Q12H  . spironolactone  25 mg Oral Daily   Continuous Infusions: . sodium chloride    . sodium chloride Stopped (05/18/2020 2338)       LOS: 18 days    Time spent: 25 minutes with > 50% spent in coordination of care and direct patient contact.    Ezekiel Slocumb, DO [facility]  06/12/2020, 1:59 PM    If 7PM-7AM, please contact night-coverage. How to contact the [facility] Attending or Consulting provider [place] or covering provider during after hours [place], for this patient?    1. Check the care team in [facility] and look for a) attending/consulting [facility] provider listed and b) the [facility] team listed 2. Log into [email or web address] and use [place]'s universal password to access. If you do not have the password, please contact the hospital operator. 3. Locate the [facility] provider you are looking for under [facility] and page to a number that you can be directly reached. 4. If you still have difficulty reaching the provider, please page the [facility] (Director on Call) for the Hospitalists listed on amion for assistance.

## 2020-06-12 NOTE — Care Management Important Message (Signed)
Important Message  Patient Details  Name: John [name] MRN: [id number] Date of Birth: 01/28/1956   Medicare Important Message Given:  Yes     Dannette Barbara 06/12/2020, 2:05 PM

## 2020-06-12 NOTE — Progress Notes (Signed)
Mobility Specialist - Progress Note   06/12/20 1210  Mobility  Activity Transferred:  Bed to chair  Level of Assistance Standby assist, set-up cues, supervision of patient - no hands on (CGA for safety)  Assistive Device Front wheel walker  Distance Ambulated (ft) 5 ft  Mobility Response Tolerated well  Mobility performed by Mobility specialist  $Mobility charge 1 Mobility    Pt laying in bed upon arrival. Pt slightly confused this AM. Hesitant, but then agreed to session. Pt mod. Independent transitioning from supine to sit EOB. Pt transferred from bed to chair w/ SBA. CGA utilized for safety. Overall, pt tolerated session well. Pt left sitting on recliner w/ all needs placed in reach. Nurse was notified.     Quenna Doepke Mobility Specialist  06/12/20, 12:15 PM

## 2020-06-13 DIAGNOSIS — C220 Liver cell carcinoma: Secondary | ICD-10-CM | POA: Diagnosis not present

## 2020-06-13 DIAGNOSIS — C221 Intrahepatic bile duct carcinoma: Secondary | ICD-10-CM | POA: Diagnosis not present

## 2020-06-13 DIAGNOSIS — D696 Thrombocytopenia, unspecified: Secondary | ICD-10-CM | POA: Diagnosis not present

## 2020-06-13 DIAGNOSIS — G893 Neoplasm related pain (acute) (chronic): Secondary | ICD-10-CM | POA: Diagnosis not present

## 2020-06-13 LAB — BASIC METABOLIC PANEL
Anion gap: 9 (ref 5–15)
BUN: 31 mg/dL — ABNORMAL HIGH (ref 8–23)
CO2: 30 mmol/L (ref 22–32)
Calcium: 8.2 mg/dL — ABNORMAL LOW (ref 8.9–10.3)
Chloride: 93 mmol/L — ABNORMAL LOW (ref 98–111)
Creatinine, Ser: 0.95 mg/dL (ref 0.61–1.24)
GFR, Estimated: >60 mL/min (ref >60)
Glucose, Bld: 111 mg/dL — ABNORMAL HIGH (ref 70–99)
Potassium: 4.5 mmol/L (ref 3.5–5.1)
Sodium: 132 mmol/L — ABNORMAL LOW (ref 135–145)

## 2020-06-13 LAB — CBC WITH DIFFERENTIAL/PLATELET
Abs Immature Granulocytes: 0.04 10*3/uL (ref 0.00–0.07)
Basophils Absolute: 0 10*3/uL (ref 0.0–0.1)
Basophils Relative: 0 %
Eosinophils Absolute: 0.3 10*3/uL (ref 0.0–0.5)
Eosinophils Relative: 3 %
HCT: 40.3 % (ref 39.0–52.0)
Hemoglobin: 12.8 g/dL — ABNORMAL LOW (ref 13.0–17.0)
Immature Granulocytes: 1 %
Lymphocytes Relative: 8 %
Lymphs Abs: 0.7 10*3/uL (ref 0.7–4.0)
MCH: 30.8 pg (ref 26.0–34.0)
MCHC: 31.8 g/dL (ref 30.0–36.0)
MCV: 97.1 fL (ref 80.0–100.0)
Monocytes Absolute: 0.8 10*3/uL (ref 0.1–1.0)
Monocytes Relative: 9 %
Neutro Abs: 6.8 10*3/uL (ref 1.7–7.7)
Neutrophils Relative %: 79 %
Platelets: 23 10*3/uL — CL (ref 150–400)
RBC: 4.15 MIL/uL — ABNORMAL LOW (ref 4.22–5.81)
RDW: 20.8 % — ABNORMAL HIGH (ref 11.5–15.5)
WBC: 8.5 10*3/uL (ref 4.0–10.5)
nRBC: 0 % (ref 0.0–0.2)

## 2020-06-13 LAB — MAGNESIUM: Magnesium: 2 mg/dL (ref 1.7–2.4)

## 2020-06-13 LAB — CBC
HCT: 36.3 % — ABNORMAL LOW (ref 39.0–52.0)
Hemoglobin: 11.7 g/dL — ABNORMAL LOW (ref 13.0–17.0)
MCH: 31.3 pg (ref 26.0–34.0)
MCHC: 32.2 g/dL (ref 30.0–36.0)
MCV: 97.1 fL (ref 80.0–100.0)
Platelets: 18 10*3/uL — CL (ref 150–400)
RBC: 3.74 MIL/uL — ABNORMAL LOW (ref 4.22–5.81)
RDW: 20.5 % — ABNORMAL HIGH (ref 11.5–15.5)
WBC: 6.1 10*3/uL (ref 4.0–10.5)
nRBC: 0 % (ref 0.0–0.2)

## 2020-06-13 LAB — TROPONIN I (HIGH SENSITIVITY)
Troponin I (High Sensitivity): 12 ng/L (ref <18)
Troponin I (High Sensitivity): 13 ng/L (ref <18)

## 2020-06-13 NOTE — TOC Progression Note (Addendum)
Transition of Care [facility]) - Progression Note    Patient Details  Name: John [name] MRN: [id number] Date of Birth: 1956-01-02  Transition of Care [facility]) CM/SW [place], LCSW Phone Number: 06/13/2020, 9:24 AM  Clinical Narrative: [facility] is still not able to transport patient to chemo even if treatments are transferred to [facility]. CSW asked [facility] admissions coordinator to review referral yesterday. Decision is pending. All other local facilities still declined bed offer even after plan to transfer treatments to [facility].  4:34 pm: [facility] is still considering patient.  Expected Discharge Plan: [place] Barriers to Discharge: No SNF bed, Insurance Authorization  Expected Discharge Plan and Services Expected Discharge Plan: [place] In-house Referral: Clinical Social Work   Post Acute Care Choice: [place] Living arrangements for the past 2 months: Single Family Home                                       Social Determinants of Health (SDOH) Interventions    Readmission Risk Interventions No flowsheet data found.

## 2020-06-13 NOTE — Progress Notes (Signed)
PROGRESS NOTE    John [name]   CWC:[id number]  DOB: [birth year]  PCP: Cletis Athens, MD    DOA: 05/10/2020 LOS: 39   Brief Narrative   Summary from Dr. Billie Ruddy 10/5: "John [name] is a 64 y.o. 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      Patient receives all his oncological care at [facility] and had requested transfer to [facility].  However, there were no beds available so he was managed at [facility].   He was found to have spontaneous bacterial peritonitis.  Ascitic fluid WBC was consistent with SBP and ascitic fluid culture showed Streptococcus mitis/oralis. He completed 5-day course of IV Rocephin on 05/29/2020.  He was previously on ciprofloxacin for SBP prophylaxis and this has been resumed.   He has mixed hepatocellular carcinoma/cholangiocarcinoma and he is status post Y 90 radioembolization in 2019 followed by Gem/Cis and capecitabine monotherapy with disease progression currently on Bev/Atezo. Last dose on 03/26/2020.  Next chemo was planned for 9/22 @ [facility] but this has been postponed because of recent acute illness.  He has a right lower quadrant percutaneous drain for drainage of ascitic fluid 3 times a week on Tuesdays, Thursdays and Saturdays.   He was also treated with lactulose and rifaximin for hepatic encephalopathy.  He has portal vein thrombosis but unfortunately he is not a candidate for anticoagulation because of severe thrombocytopenia.   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  Myoclonic jerks - suspect due to hyperammonemia / metabolic.  Ammonia level normal at 27 on 10/7.   Continue lactulose, ensure at least 2-3 soft BM's daily.  Monitor.  Recurrent ascites status post peritoneal catheter which placed on09/01 --scheduled to drain 3 times a week on Tuesdays, Thursdays and Saturdays.  Up to 3L.   --cont lasix and aldactone  --would change scheduled drain to PRN if pt experiences discomfort, to reduce fluid and protein loss, nursing updated on this plan. --drained on 10/7, fluid sent for cell counts - 64 neut's, culture pending --follow up ascites culture from 10/7  SBP, POA, treated & resolved - Ascitic fluid WBC was consistent with SBP and ascitic fluid culture showed Streptococcus mitis/oralis.  He completed 5-day course of IV Rocephin on 05/29/2020. No abdominal pain. --cont Cipro for SBP ppx --monitor closely for recurrence  Mixed hepatocellular carcinoma/cholangiocarcinoma - has been followed at [facility]. --pt on home avatrombopag.  Hospital doesn't have this medication.  Family unable to bring it to the hospital. Patient now agreeable to have oncology care transferred here, locally.  Dr. Grayland Ormond notified and will arrange follow up in [place] after discharge.  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 --reduce lab checks  to every other day to minimize blood loss   Pressure injury of Sacrum - undetermined if present on admission. Pressure Injury 05/29/20 Sacrum Stage 2 -  Partial thickness loss of dermis presenting as a shallow open injury with a red, pink wound bed without slough. (Active)  05/29/20 1024  Location: Sacrum  Location Orientation:   Staging: Stage 2 -  Partial thickness loss of dermis presenting as a shallow open injury with a red, pink wound bed without slough.  Wound Description (Comments):   Present on Admission:       DVT prophylaxis: Place and maintain sequential compression device Start: 05/26/20 0823 SCDs Start: 06/01/2020 0935   Diet:  Diet Orders (From admission, onward)    Start     Ordered   06/05/2020 0936  Diet 2 gram sodium Room service appropriate? Yes; Fluid consistency: Thin  Diet effective now       Question Answer Comment  Room service appropriate? Yes   Fluid consistency: Thin      05/22/2020 0936            Code Status: Full Code    Subjective 06/13/20    Patient seen at bedside this AM, working with OT.  He reports abdomen "hurts like the dickens", denies fever/chills.  No other complaints.  Hopes to get out of hospital soon.   Disposition Plan & Communication   Status is: Inpatient  Remains inpatient appropriate because:SNF placement pending, pt too weak to safely return home   Dispo:  Patient From: Home  Planned Disposition: [place]  Expected discharge date: pending SNF bed  Medically stable for discharge: Yes     Family Communication: none at bedside on rounds, brother updated at bedside on 10/6    Consults, Procedures, Significant Events   Consultants:    Procedures:     Antimicrobials:  Anti-infectives (From admission, onward)   Start     Dose/Rate Route Frequency Ordered Stop   06/03/20 1000  ciprofloxacin (CIPRO) tablet 500 mg        500 mg Oral Daily 06/03/20 0748     05/29/20 1300  cefTRIAXone (ROCEPHIN) 2  g in sodium chloride 0.9 % 100 mL IVPB        2 g 200 mL/hr over 30 Minutes Intravenous  Once 05/28/20 1354 05/29/20 1323   05/27/20 1600  rifaximin (XIFAXAN) tablet 550 mg        550 mg Oral 2 times daily 05/27/20 1506     05/27/20 0900  cefTRIAXone (ROCEPHIN) 2 g in sodium chloride 0.9 % 100 mL IVPB  Status:  Discontinued        2 g 200 mL/hr over 30 Minutes Intravenous Every 24 hours 05/26/20 1149 05/28/20 1302   06/03/2020 1800  cefTRIAXone (ROCEPHIN) 2 g in sodium chloride 0.9 % 100 mL IVPB  Status:  Discontinued        2 g 200 mL/hr over 30 Minutes Intravenous Every 12 hours 05/07/2020 0936 05/26/20 1149   06/03/2020 0615  cefTRIAXone (ROCEPHIN) 2 g in sodium chloride 0.9 % 100 mL IVPB        2 g 200 mL/hr over 30 Minutes Intravenous  Once 05/18/2020 0604 05/15/2020 0736        Objective   Vitals:   06/12/20 2211 06/13/20 0012 06/13/20 8185 06/13/20  1148  BP: (!) 98/49 100/66 109/60 (!) 107/49  Pulse: 88 87 80 84  Resp: 18 18 18 16   Temp: 98 F (36.7 C) 98.2 F (36.8 C) (!) 97.5 F (36.4 C) 97.7 F (36.5 C)  TempSrc: Oral Oral Oral Oral  SpO2: 97% 98% 98% 98%  Weight:      Height:        Intake/Output Summary (Last 24 hours) at 06/13/2020 1240 Last data filed at 06/12/2020 2300 Gross per 24 hour  Intake --  Output 1030 ml  Net -1030 ml   Filed Weights   05/30/20 0422 05/31/20 0332 06/02/20 0257  Weight: 71.5 kg 71.5 kg 71.4 kg    Physical Exam:  General exam: awake, alert, no acute distress, cachectic Respiratory system: CTAB, normal respiratory effort. Cardiovascular system: normal S1/S2, RRR, no pedal edema.   Gastrointestinal system: non-tender, peritoneal drain in place. Extremities: did not notice myoclonic jerks today, no asterixis appreciated, no peripheral edema   Labs   Data Reviewed: I have personally reviewed following labs and imaging studies  CBC: Recent Labs  Lab 06/07/20 0530 06/07/20 0530 06/08/20 0435 06/09/20 0535 06/10/20 0502  06/11/20 1817 06/13/20 1108  WBC 8.2   < > 7.9 10.8* 9.9 12.3* 8.5  NEUTROABS 6.2  --  6.0  --   --   --  6.8  HGB 11.8*   < > 11.9* 13.5 13.3 12.8* 12.8*  HCT 35.5*   < > 36.6* 41.7 40.4 38.3* 40.3  MCV 93.7   < > 97.1 97.0 96.0 94.8 97.1  PLT 16*   < > 17* 14* 16* 18* 23*   < > = values in this interval not displayed.   Basic Metabolic Panel: Recent Labs  Lab 06/07/20 0530 06/08/20 0435 06/09/20 0535 06/10/20 0502 06/11/20 1817  NA 133* 135 136 137 133*  K 5.1 4.7 4.7 3.9 4.5  CL 95* 96* 95* 95* 92*  CO2 29 29 27 31 29   GLUCOSE 100* 114* 84 103* 108*  BUN 39* 36* 33* 31* 36*  CREATININE 1.14 1.06 1.09 1.13 1.11  CALCIUM 8.5* 8.3* 8.9 8.8* 8.5*  MG 2.2  --  2.2 2.2 2.0   GFR: Estimated Creatinine Clearance: 67.9 mL/min (by C-G formula based on SCr of 1.11 mg/dL). Liver Function Tests: No results for input(s): AST, ALT, ALKPHOS, BILITOT, PROT, ALBUMIN in the last 168 hours. No results for input(s): LIPASE, AMYLASE in the last 168 hours. Recent Labs  Lab 06/12/20 1501  AMMONIA 27   Coagulation Profile: No results for input(s): INR, PROTIME in the last 168 hours. Cardiac Enzymes: No results for input(s): CKTOTAL, CKMB, CKMBINDEX, TROPONINI in the last 168 hours. BNP (last 3 results) No results for input(s): PROBNP in the last 8760 hours. HbA1C: No results for input(s): HGBA1C in the last 72 hours. CBG: No results for input(s): GLUCAP in the last 168 hours. Lipid Profile: No results for input(s): CHOL, HDL, LDLCALC, TRIG, CHOLHDL, LDLDIRECT in the last 72 hours. Thyroid Function Tests: No results for input(s): TSH, T4TOTAL, FREET4, T3FREE, THYROIDAB in the last 72 hours. Anemia Panel: No results for input(s): VITAMINB12, FOLATE, FERRITIN, TIBC, IRON, RETICCTPCT in the last 72 hours. Sepsis Labs: No results for input(s): PROCALCITON, LATICACIDVEN in the last 168 hours.  Recent Results (from the past 240 hour(s))  Body fluid culture     Status: None (Preliminary  result)   Collection Time: 06/12/20  6:29 PM   Specimen: Peritoneal Washings; Body Fluid  Result Value Ref  Range Status   Specimen Description   Final    PERITONEAL Performed at [facility], [address], [place], [place] [address]    Special Requests   Final    NONE Performed at [facility], [address] [place], [place] [address]    Gram Stain   Final    MODERATE WBC PRESENT,BOTH PMN AND MONONUCLEAR NO ORGANISMS SEEN Performed at [facility], [place] [address], [place], [place] [address]    Culture PENDING  Incomplete   Report Status PENDING  Incomplete      Imaging Studies   No results found.   Medications   Scheduled Meds: . baclofen  10 mg Oral TID  . ciprofloxacin  500 mg Oral Daily  . collagenase   Topical Daily  . feeding supplement (ENSURE ENLIVE)  237 mL Oral TID BM  . folic acid  1 mg Oral Daily  . furosemide  40 mg Oral BID  . lactulose  20 g Oral BID  . loratadine  10 mg Oral Daily  . magnesium oxide  400 mg Oral Daily  . morphine  15 mg Oral Q12H  . multivitamin with minerals  1 tablet Oral Daily  . pantoprazole  40 mg Oral Daily  . rifaximin  550 mg Oral BID  . sodium chloride flush  3 mL Intravenous Q12H  . spironolactone  25 mg Oral Daily   Continuous Infusions: . sodium chloride    . sodium chloride Stopped (06/01/2020 2338)       LOS: 19 days    Time spent: 25 minutes with > 50% spent in coordination of care and direct patient contact.    Ezekiel Slocumb, DO [facility]  06/13/2020, 12:40 PM    If 7PM-7AM, please contact night-coverage. How to contact the [facility] Attending or Consulting provider [place] or covering provider during after hours [place], for this patient?    1. Check the care team in [facility] and look for a) attending/consulting TR[facility] provider listed and b) the [facility] team listed 2. Log into [email or web address] and use [place]'s universal password to access. If you do not have the password,  please contact the hospital operator. 3. Locate the [facility] provider you are looking for under [facility] and page to a number that you can be directly reached. 4. If you still have difficulty reaching the provider, please page the [facility] (Director on Call) for the Hospitalists listed on amion for assistance.

## 2020-06-13 NOTE — Progress Notes (Addendum)
Occupational Therapy Treatment Patient Details Name: John [name] MRN: [id number] DOB: 1956/03/30 Today's Date: 06/13/2020    History of present illness 64 y.o. male with medical history significant for hepatocellular carcinoma and cholangiocarcinoma on bevacizumab/atezolizumab complicated by recurrent ascites status post peritoneal catheter which was placed on 09/01, history of chronic alcoholic pancreatitis, hepatitis B, COPD, seizure disorder who presented to the ED via EMS for evaluation of worsening abdominal pain   OT comments  John [name] was seen for OT treatment on this date. Upon arrival to room pt seated EOB agreeable to ADL session. Pt currently requires CGA + RW for ADL t/f, ambulating ~10 ft prior to requesting to sit. MIN A don/doff B socks and gown seated EOB. CGA bathing UB seated EOB, MIN A + RW bathing perineal area standing - assist for balance. RN in room at beginning of session to give medications. Pt left seated EOB at end of session c PT in room. Pt progressing towards goals; however pt can be self-limiting. Goals updated this session. Pt continues to benefit from skilled OT services to maximize return to PLOF and minimize risk of future falls, injury, and readmission. Will continue to follow POC. Discharge recommendation remains appropriate as pt lives alone and was previously functioning at higher level of independence PTA.    Follow Up Recommendations  SNF    Equipment Recommendations  3 in 1 bedside commode    Recommendations for Other Services      Precautions / Restrictions Precautions Precautions: Fall Restrictions Weight Bearing Restrictions: No       Mobility Bed Mobility Overal bed mobility: Needs Assistance      General bed mobility comments: Pt received and left sitting EOB   Transfers Overall transfer level: Needs assistance Equipment used: Rolling walker (2 wheeled) Transfers: Sit to/from Stand Sit to Stand: Min guard         Balance  Overall balance assessment: Needs assistance Sitting-balance support: No upper extremity supported;Feet supported Sitting balance-Leahy Scale: Good     Standing balance support: Single extremity supported;During functional activity Standing balance-Leahy Scale: Fair            ADL either performed or assessed with clinical judgement   ADL Overall ADL's : Needs assistance/impaired          General ADL Comments: MIN A don/doff B socks seated EOB. CGA bathing UB seated EOB, MIN A + RW bathing perineal area standing - assist for balance. CGA + RW for ADL t/f.                Cognition Arousal/Alertness: Awake/alert Behavior During Therapy: WFL for tasks assessed/performed Overall Cognitive Status: No family/caregiver present to determine baseline cognitive functioning       Exercises Exercises: Other exercises Other Exercises Other Exercises: Pt educated re: d/c recs, falls prevention, ECS Other Exercises: LBD, bathing, sit<>stand x3, sitting/standing balance/tolerance, hair brushing           Pertinent Vitals/ Pain       Pain Assessment: No/denies pain         Frequency  Min 1X/week        Progress Toward Goals  OT Goals(current goals can now be found in the care plan section)  Progress towards OT goals: Progressing toward goals  Acute Rehab OT Goals Patient Stated Goal: to get comfortable OT Goal Formulation: With patient Time For Goal Achievement: 06/27/20 Potential to Achieve Goals: Good ADL Goals Pt Will Perform Lower Body Dressing: sit to/from stand;with supervision (  c LRAD PRN) Pt Will Transfer to Toilet: with supervision;ambulating;bedside commode ( c LRAD PRN) Additional ADL Goal #1: Pt will verbalize plan to implement at least 1 learned falls prevention strategy to maximize safety with ADL  Plan Discharge plan remains appropriate;Frequency remains appropriate       AM-PAC OT "6 Clicks" Daily Activity     Outcome Measure   Help from  another person eating meals?: None Help from another person taking care of personal grooming?: A Little Help from another person toileting, which includes using toliet, bedpan, or urinal?: A Little Help from another person bathing (including washing, rinsing, drying)?: A Little Help from another person to put on and taking off regular upper body clothing?: A Little Help from another person to put on and taking off regular lower body clothing?: A Little 6 Click Score: 19    End of Session Equipment Utilized During Treatment: Rolling walker  OT Visit Diagnosis: Other abnormalities of gait and mobility (R26.89);Muscle weakness (generalized) (M62.81)   Activity Tolerance Patient tolerated treatment well   Patient Left in bed;Other (comment) (seated EOB c PT at end of session)   Nurse Communication Mobility status        Time: 8638-1771 OT Time Calculation (min): 39 min  Charges: OT General Charges $OT Visit: 1 Visit OT Treatments $Self Care/Home Management : 23-37 mins $Therapeutic Activity: 8-22 mins  Dessie Coma, M.S. OTR/L  06/13/20, 12:12 PM  ascom [phone]

## 2020-06-13 NOTE — Progress Notes (Signed)
Mobility Specialist - Progress Note   06/13/20 1636  Mobility  Activity Ambulated in room  Range of Motion/Exercises Right leg;Left leg (Standing march)  Level of Assistance Standby assist, set-up cues, supervision of patient - no hands on  Assistive Device Front wheel walker  Distance Ambulated (ft) 20 ft  Mobility Response Tolerated well  Mobility performed by Mobility specialist  $Mobility charge 1 Mobility    Pre-mobility: 99 HR, 93% SpO2 Post-mobility: 85 HR, 97% SpO2   Pt was sitting EOB upon arrival. Pt agreed to session. Pt was SBA in all transfers this date. Pt ambulated 20' in room before performing standing march for 20 seconds with close supervision. Further mobility was limited d/t pt c/o feeling "swimmy-headed" and rating it a 7/10. Pt stated that this feeling often comes and goes. Overall, pt tolerated session well. Pt was left EOB with all needs in reach.    Kathee Delton Mobility Specialist 06/13/20, 4:39 PM

## 2020-06-13 NOTE — Progress Notes (Signed)
Physical Therapy Treatment Patient Details Name: John [name] MRN: [id number] DOB: 1955/09/15 Today's Date: 06/13/2020    History of Present Illness 64 y.o. male with medical history significant for hepatocellular carcinoma and cholangiocarcinoma on bevacizumab/atezolizumab complicated by recurrent ascites status post peritoneal catheter which was placed on 09/01, history of chronic alcoholic pancreatitis, hepatitis B, COPD, seizure disorder who presented to the ED via EMS for evaluation of worsening abdominal pain    PT Comments    OT finishing upon arrival and activity continued.  Stood at sink to brush teeth without difficulty.  He is able to walk to door and back with RW and min guard/supervision - self limiting distance.  He voiced minimal fatigue with no LOB or buckling but did not want to walk further.  To commode in attempt to [facility] but no results.  Declined further gait and returned to bed.    Follow Up Recommendations  SNF;Supervision - Intermittent     Equipment Recommendations  Rolling walker with 5" wheels    Recommendations for Other Services       Precautions / Restrictions Precautions Precautions: Fall Restrictions Weight Bearing Restrictions: No    Mobility  Bed Mobility Overal bed mobility: Needs Assistance Bed Mobility: Sit to Supine       Sit to supine: HOB elevated;Supervision   General bed mobility comments: Pt received and left sitting EOB   Transfers Overall transfer level: Needs assistance Equipment used: Rolling walker (2 wheeled) Transfers: Sit to/from Stand Sit to Stand: Supervision;Min guard            Ambulation/Gait Ambulation/Gait assistance: Min guard Gait Distance (Feet): 35 Feet Assistive device: Rolling walker (2 wheeled) Gait Pattern/deviations: Decreased step length - right;Decreased step length - left Gait velocity: decreased       Stairs             Wheelchair Mobility    Modified Rankin (Stroke Patients  Only)       Balance Overall balance assessment: Needs assistance Sitting-balance support: No upper extremity supported;Feet supported Sitting balance-Leahy Scale: Good     Standing balance support: Single extremity supported;During functional activity Standing balance-Leahy Scale: Fair                              Cognition Arousal/Alertness: Awake/alert Behavior During Therapy: WFL for tasks assessed/performed Overall Cognitive Status: No family/caregiver present to determine baseline cognitive functioning                                        Exercises Other Exercises Other Exercises: stood at sink to brush teeth without difficulty, to commode in attempt to BM but no results Other Exercises: LBD, bathing, sit<>stand x3, sitting/standing balance/tolerance, hair brushing    General Comments        Pertinent Vitals/Pain Pain Assessment: Faces Faces Pain Scale: Hurts even more Pain Location: chest and back Pain Descriptors / Indicators: Aching;Discomfort;Grimacing Pain Intervention(s): Limited activity within patient's tolerance;Monitored during session    Home Living                      Prior Function            PT Goals (current goals can now be found in the care plan section) Acute Rehab PT Goals Patient Stated Goal: to get comfortable Progress towards PT goals: Progressing  toward goals    Frequency    Min 2X/week      PT Plan Current plan remains appropriate    Co-evaluation              AM-PAC PT "6 Clicks" Mobility   Outcome Measure  Help needed turning from your back to your side while in a flat bed without using bedrails?: None Help needed moving from lying on your back to sitting on the side of a flat bed without using bedrails?: A Little Help needed moving to and from a bed to a chair (including a wheelchair)?: None Help needed standing up from a chair using your arms (e.g., wheelchair or bedside  chair)?: None Help needed to walk in hospital room?: A Little Help needed climbing 3-5 steps with a railing? : A Little 6 Click Score: 21    End of Session Equipment Utilized During Treatment: Gait belt Activity Tolerance: Patient limited by fatigue Patient left: in bed;with call bell/phone within reach;with bed alarm set Nurse Communication: Mobility status       Time: 1216-2446 PT Time Calculation (min) (ACUTE ONLY): 23 min  Charges:  $Therapeutic Activity: 8-22 mins                    Chesley Noon, PTA 06/13/20, 12:30 PM

## 2020-06-14 DIAGNOSIS — G893 Neoplasm related pain (acute) (chronic): Secondary | ICD-10-CM | POA: Diagnosis not present

## 2020-06-14 DIAGNOSIS — C221 Intrahepatic bile duct carcinoma: Secondary | ICD-10-CM | POA: Diagnosis not present

## 2020-06-14 DIAGNOSIS — D696 Thrombocytopenia, unspecified: Secondary | ICD-10-CM | POA: Diagnosis not present

## 2020-06-14 DIAGNOSIS — C220 Liver cell carcinoma: Secondary | ICD-10-CM | POA: Diagnosis not present

## 2020-06-14 MED ORDER — CALCIUM CARBONATE ANTACID 500 MG PO CHEW: 1.0000 | CHEWABLE_TABLET | Freq: Three times a day (TID) | ORAL | Status: DC | PRN

## 2020-06-14 NOTE — Treatment Plan (Signed)
Seen patient at this time. Pt asked this nurse if I have ever drained pleurax before. Nurse answered question honestly, no. Patient states he does not want anyone to touch his drain until tomorrow but allowed RN to change sore in RLQ. MD Arbutus Ped made aware via face to face conversation. Pt educated on order and refuses. Wet to dry dressing placed with santyl ointment.

## 2020-06-14 NOTE — Progress Notes (Addendum)
PROGRESS NOTE    John [name]   UXN:[id number]  DOB: Jun 30, 1956  PCP: Cletis Athens, MD    DOA: 06/02/2020 LOS: 20   Brief Narrative   Summary from Dr. Billie Ruddy 10/5: "John [name] is a 64 y.o. 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      Patient receives all his oncological care at [facility] and had requested transfer to [facility].  However, there were no beds available so he was managed at [facility].   He was found to have spontaneous bacterial peritonitis.  Ascitic fluid WBC was consistent with SBP and ascitic fluid culture showed Streptococcus mitis/oralis. He completed 5-day course of IV Rocephin on 05/29/2020.  He was previously on ciprofloxacin for SBP prophylaxis and this has been resumed.   He has mixed hepatocellular carcinoma/cholangiocarcinoma and he is status post Y 90 radioembolization in 2019 followed by Gem/Cis and capecitabine monotherapy with disease progression currently on Bev/Atezo. Last dose on 03/26/2020.  Next chemo was planned for 9/22 @ [facility] but this has been postponed because of recent acute illness.  He has a right lower quadrant percutaneous drain for drainage of ascitic fluid 3 times a week on Tuesdays, Thursdays and Saturdays.   He was also treated with lactulose and rifaximin for hepatic encephalopathy.  He has portal vein thrombosis but unfortunately he is not a candidate for anticoagulation because of severe thrombocytopenia.   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   --cont lactulose 20g BID and rifaximin --Ensure 2-3 BM's per day --If pt continues to have too many BM's, will reduce lactulose.  Myoclonic jerks - intermittent.  Suspect due to hyperammonemia / metabolic.  Ammonia level normal at 27 on 10/7 when jerking was particularly bothersome to patient.  Continue lactulose, ensure at least 2-3 soft BM's daily.  Monitor.  Recurrent ascites status post peritoneal catheter which placed on09/01 10/9 patient declining to have abdomen drained today despite abdomen becoming distended --drain ascites 3 times/week on Tu/Th/Sat, up to 3L at one time.   --cont lasix and aldactone  --would change scheduled drain to PRN if pt experiences discomfort, to reduce fluid and protein loss, nursing updated on this plan. --drained on 10/7, fluid sent for cell counts - 64 neut's, culture pending ( neg to date ) --follow up ascites culture from 10/7  SBP, POA, treated & resolved - Ascitic fluid WBC was consistent with SBP and ascitic fluid culture showed Streptococcus mitis/oralis.  He completed 5-day course of IV Rocephin on 05/29/2020. No abdominal pain. --cont Cipro for SBP ppx --monitor closely for recurrence  Mixed hepatocellular carcinoma/cholangiocarcinoma - has been followed at [facility]. --pt on home avatrombopag.  Hospital doesn't have this medication.  Family unable to  bring it to the hospital. Patient now agreeable to have oncology care transferred here, locally.  Dr. Grayland Ormond notified and will arrange follow up in [place] after discharge.  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 --reduce lab checks to every other day to minimize blood loss  GERD / Heartburn / Indigestion - continue Protonix (takes omeprazole at home), PRN Tums or Mylanta  Pressure injury of Sacrum - undetermined if present on admission. Pressure Injury 05/29/20 Sacrum Stage 2 -  Partial thickness loss of dermis presenting as a shallow open injury with a red, pink wound bed without slough. (Active)  05/29/20 1024  Location: Sacrum  Location Orientation:   Staging: Stage 2 -  Partial thickness loss of dermis presenting as a shallow open injury with a red, pink wound bed without slough.  Wound Description (Comments):   Present on Admission:       DVT prophylaxis: Place and maintain sequential compression device Start: 05/26/20 0823 SCDs Start: 05/20/2020 0935   Diet:  Diet Orders (From admission, onward)    Start     Ordered   05/10/2020 0936  Diet 2 gram sodium Room service appropriate? Yes; Fluid consistency: Thin  Diet effective now       Question Answer Comment  Room service appropriate? Yes   Fluid consistency: Thin      05/27/2020 0936            Code Status: Full Code    Subjective 06/14/20    Patient seen at bedside this AM.  Says his lower left rib cage hurts, this comes and goes.  Denies heartburn currently but says this has been bothersome recently.   He had an episode of burning epigastric/chest pain yesterday, was reassured to hear both troponin's normal.  Denies CP today.  Disposition Plan & Communication   Status is: Inpatient  Remains inpatient appropriate because:SNF placement pending, pt too weak to safely return home   Dispo:  Patient From: Home  Planned Disposition: [place]  Expected discharge date: pending SNF bed  Medically stable for discharge: Yes      Family Communication: none at bedside on rounds, brother updated at bedside on 10/6    Consults, Procedures, Significant Events   Consultants:    Procedures:     Antimicrobials:  Anti-infectives (From admission, onward)   Start     Dose/Rate Route Frequency Ordered Stop   06/03/20 1000  ciprofloxacin (CIPRO) tablet 500 mg        500 mg Oral Daily 06/03/20 0748     05/29/20 1300  cefTRIAXone (ROCEPHIN) 2 g in sodium chloride 0.9 % 100 mL IVPB        2 g 200 mL/hr over 30 Minutes Intravenous  Once 05/28/20 1354 05/29/20 1323   05/27/20 1600  rifaximin (XIFAXAN) tablet 550 mg        550 mg Oral 2 times daily 05/27/20 1506     05/27/20 0900  cefTRIAXone (ROCEPHIN) 2 g in sodium chloride 0.9 % 100 mL IVPB  Status:  Discontinued        2 g 200 mL/hr over 30 Minutes Intravenous Every 24 hours 05/26/20 1149 05/28/20 1302   06/03/2020 1800  cefTRIAXone (ROCEPHIN) 2 g in sodium chloride 0.9 % 100 mL IVPB  Status:  Discontinued        2 g 200 mL/hr over 30 Minutes Intravenous  Every 12 hours 05/16/2020 0936 05/26/20 1149   06/01/2020 0615  cefTRIAXone (ROCEPHIN) 2 g in sodium chloride 0.9 % 100 mL IVPB        2 g 200 mL/hr over 30 Minutes Intravenous  Once 05/23/2020 0604 05/17/2020 0736        Objective   Vitals:   06/13/20 2332 06/14/20 0400 06/14/20 0800 06/14/20 1143  BP: 119/61 (!) 98/57 101/64 (!) 109/54  Pulse: 87 84 83 92  Resp: 20 18 18 19   Temp: 97.8 F (36.6 C) 97.9 F (36.6 C) (!) 97.4 F (36.3 C) (!) 97.5 F (36.4 C)  TempSrc: Oral Oral Oral Oral  SpO2: 98% 96% 98% 96%  Weight:      Height:        Intake/Output Summary (Last 24 hours) at 06/14/2020 1417 Last data filed at 06/14/2020 1300 Gross per 24 hour  Intake 420 ml  Output 600 ml  Net -180 ml   Filed Weights   05/30/20 0422 05/31/20 0332 06/02/20 0257  Weight: 71.5 kg 71.5 kg 71.4 kg    Physical Exam:  General exam: awake, alert, no acute distress, cachectic Respiratory system: CTAB, normal  respiratory effort. Cardiovascular system: normal S1/S2, RRR, no pedal edema.   Gastrointestinal system: mildly tender and more distended, peritoneal drain in place with clean/dry dressing intact. Extremities: no myoclonic jerks seen, no peripheral edema   Labs   Data Reviewed: I have personally reviewed following labs and imaging studies  CBC: Recent Labs  Lab 06/08/20 0435 06/08/20 0435 06/09/20 0535 06/10/20 0502 06/11/20 1817 06/13/20 1108 06/13/20 1721  WBC 7.9   < > 10.8* 9.9 12.3* 8.5 6.1  NEUTROABS 6.0  --   --   --   --  6.8  --   HGB 11.9*   < > 13.5 13.3 12.8* 12.8* 11.7*  HCT 36.6*   < > 41.7 40.4 38.3* 40.3 36.3*  MCV 97.1   < > 97.0 96.0 94.8 97.1 97.1  PLT 17*   < > 14* 16* 18* 23* 18*   < > = values in this interval not displayed.   Basic Metabolic Panel: Recent Labs  Lab 06/08/20 0435 06/09/20 0535 06/10/20 0502 06/11/20 1817 06/13/20 1721  NA 135 136 137 133* 132*  K 4.7 4.7 3.9 4.5 4.5  CL 96* 95* 95* 92* 93*  CO2 29 27 31 29 30   GLUCOSE 114* 84 103* 108* 111*  BUN 36* 33* 31* 36* 31*  CREATININE 1.06 1.09 1.13 1.11 0.95  CALCIUM 8.3* 8.9 8.8* 8.5* 8.2*  MG  --  2.2 2.2 2.0 2.0   GFR: Estimated Creatinine Clearance: 79.3 mL/min (by C-G formula based on SCr of 0.95 mg/dL). Liver Function Tests: No results for input(s): AST, ALT, ALKPHOS, BILITOT, PROT, ALBUMIN in the last 168 hours. No results for input(s): LIPASE, AMYLASE in the last 168 hours. Recent Labs  Lab 06/12/20 1501  AMMONIA 27   Coagulation Profile: No results for input(s): INR, PROTIME in the last 168 hours. Cardiac Enzymes: No results for input(s): CKTOTAL, CKMB, CKMBINDEX, TROPONINI in the last 168 hours. BNP (last 3 results) No results for input(s): PROBNP in the last 8760 hours. HbA1C: No results for input(s): HGBA1C in the last 72 hours. CBG: No results for input(s): GLUCAP in the last 168 hours. Lipid Profile: No results for input(s): CHOL, HDL, LDLCALC, TRIG,  CHOLHDL, LDLDIRECT in the last 72 hours. Thyroid Function Tests: No results for input(s): TSH, T4TOTAL, FREET4, T3FREE, THYROIDAB in the  last 72 hours. Anemia Panel: No results for input(s): VITAMINB12, FOLATE, FERRITIN, TIBC, IRON, RETICCTPCT in the last 72 hours. Sepsis Labs: No results for input(s): PROCALCITON, LATICACIDVEN in the last 168 hours.  Recent Results (from the past 240 hour(s))  Body fluid culture     Status: None (Preliminary result)   Collection Time: 06/12/20  6:29 PM   Specimen: Peritoneal Washings; Body Fluid  Result Value Ref Range Status   Specimen Description   Final    PERITONEAL Performed at [facility], [address] [place], [place] [address]    Special Requests   Final    NONE Performed at [facility], [address] [place], [place] [address]    Gram Stain   Final    MODERATE WBC PRESENT,BOTH PMN AND MONONUCLEAR NO ORGANISMS SEEN    Culture   Final    NO GROWTH 1 DAY Performed at [facility], [place] [address] [place], [place] [address]    Report Status PENDING  Incomplete      Imaging Studies   No results found.   Medications   Scheduled Meds: . baclofen  10 mg Oral TID  . ciprofloxacin  500 mg Oral Daily  . collagenase   Topical Daily  . feeding supplement (ENSURE ENLIVE)  237 mL Oral TID BM  . folic acid  1 mg Oral Daily  . furosemide  40 mg Oral BID  . lactulose  20 g Oral BID  . loratadine  10 mg Oral Daily  . magnesium oxide  400 mg Oral Daily  . morphine  15 mg Oral Q12H  . multivitamin with minerals  1 tablet Oral Daily  . pantoprazole  40 mg Oral Daily  . rifaximin  550 mg Oral BID  . sodium chloride flush  3 mL Intravenous Q12H  . spironolactone  25 mg Oral Daily   Continuous Infusions: . sodium chloride    . sodium chloride Stopped (05/18/2020 2338)       LOS: 20 days    Time spent: 20 minutes    Ezekiel Slocumb, DO [facility]  06/14/2020, 2:17 PM    If 7PM-7AM,  please contact night-coverage. How to contact the [facility] Attending or Consulting provider [place] or covering provider during after hours [place], for this patient?    1. Check the care team in [facility] and look for a) attending/consulting [facility] provider listed and b) the [facility] team listed 2. Log into [email or web address] and use [place]'s universal password to access. If you do not have the password, please contact the hospital operator. 3. Locate the [facility] provider you are looking for under [facility] and page to a number that you can be directly reached. 4. If you still have difficulty reaching the provider, please page the [facility] (Director on Call) for the Hospitalists listed on amion for assistance.

## 2020-06-15 DIAGNOSIS — K652 Spontaneous bacterial peritonitis: Secondary | ICD-10-CM | POA: Diagnosis not present

## 2020-06-15 DIAGNOSIS — D696 Thrombocytopenia, unspecified: Secondary | ICD-10-CM | POA: Diagnosis not present

## 2020-06-15 DIAGNOSIS — G893 Neoplasm related pain (acute) (chronic): Secondary | ICD-10-CM | POA: Diagnosis not present

## 2020-06-15 DIAGNOSIS — C220 Liver cell carcinoma: Secondary | ICD-10-CM | POA: Diagnosis not present

## 2020-06-15 MED ORDER — ALPRAZOLAM 0.25 MG PO TABS
0.2500 mg | ORAL_TABLET | Freq: Three times a day (TID) | ORAL | Status: DC | PRN
  Administered 2020-06-15 – 2020-06-18 (×2): 0.25 mg via ORAL
  Filled 2020-06-15 (×2): qty 1

## 2020-06-15 MED ORDER — PANTOPRAZOLE SODIUM 40 MG PO TBEC
40.0000 mg | DELAYED_RELEASE_TABLET | Freq: Two times a day (BID) | ORAL | Status: DC
  Administered 2020-06-15 – 2020-06-21 (×12): 40 mg via ORAL
  Filled 2020-06-15 (×12): qty 1

## 2020-06-15 NOTE — Progress Notes (Signed)
Offered to have PleurX (peritoneal) drained this AM as he did not have it done yesterday as scheduled. Pt has declined and is requesting to see his doctor this AM to answer some questions first and have some anti-anxiety medicine before having it done. I informed pt that I will pass this on to the day RN and will send the day MD a message after 7am.

## 2020-06-15 NOTE — Progress Notes (Signed)
PROGRESS NOTE    John [name]   AOZ:[id number]  DOB: [birth year]  PCP: Cletis [place], MD    DOA: 05/18/2020 LOS: 21   Brief Narrative   Summary from Dr. Billie Ruddy 10/5: "John [name] is a 64 y.o. 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      Patient receives all his oncological care at [facility] and had requested transfer to [facility].  However, there were no beds available so he was managed at [facility].   He was found to have spontaneous bacterial peritonitis.  Ascitic fluid WBC was consistent with SBP and ascitic fluid culture showed Streptococcus mitis/oralis. He completed 5-day course of IV Rocephin on 05/29/2020.  He was previously on ciprofloxacin for SBP prophylaxis and this has been resumed.   He has mixed hepatocellular carcinoma/cholangiocarcinoma and he is status post Y 90 radioembolization in 2019 followed by Gem/Cis and capecitabine monotherapy with disease progression currently on Bev/Atezo. Last dose on 03/26/2020.  Next chemo was planned for 9/22 @ [facility] but this has been postponed because of recent acute illness.  He has a right lower quadrant percutaneous drain for drainage of ascitic fluid 3 times a week on Tuesdays, Thursdays and Saturdays.   He was also treated with lactulose and rifaximin for hepatic encephalopathy.  He has portal vein thrombosis but unfortunately he is not a candidate for anticoagulation because of severe thrombocytopenia.   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   --cont lactulose 20g BID and rifaximin --Ensure 2-3 BM's per day BM's have slowed down, pt reports 1-2 BM's past couple days, no confusion or lethargy noted.  Myoclonic jerks - intermittent.  Suspect due to hyperammonemia / metabolic.  Ammonia level normal at 27 on 10/7 when jerking was particularly bothersome to patient.  Continue lactulose, ensure at least 2-3 soft BM's daily.  Monitor. 10/10: has not reported for past 2 days, and not seen during encounters  Recurrent ascites status post peritoneal catheter which placed on09/01 10/9 patient declining to have abdomen drained today despite abdomen becoming distended --drain ascites 3 times/week on Tu/Th/Sat, up to 3L at one time.   --may give Xanax and/or pain medication prior to draining  --cont Lasix and Aldactone  --consider PRN draining too much discomfort, to reduce fluid and protein loss --drained on 10/7, fluid sent for cell counts - 64 neut's, culture pending ( neg to date ) --follow up ascites culture from 10/7  SBP, POA, treated & resolved - Ascitic fluid WBC was consistent with SBP and ascitic fluid culture showed Streptococcus mitis/oralis.  He completed 5-day course of IV Rocephin on 05/29/2020. No abdominal pain. --cont Cipro for SBP ppx --monitor closely for recurrence  Mixed hepatocellular carcinoma/cholangiocarcinoma -  has been followed at [facility]. --pt on home avatrombopag.  Hospital doesn't have this medication.  Family unable to bring it to the hospital. Patient now agreeable to have oncology care transferred here,  locally.   Dr. Grayland Ormond notified and will arrange follow up in [place] after discharge.  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 --reduce lab checks to every other day  GERD / Heartburn / Indigestion - continue Protonix (takes omeprazole at home), PRN Tums or Mylanta  Pressure injury of Sacrum - undetermined if present on admission. Pressure Injury 05/29/20 Sacrum Stage 2 -  Partial thickness loss of dermis presenting as a shallow open injury with a red, pink wound bed without slough. (Active)  05/29/20 1024  Location: Sacrum  Location Orientation:   Staging: Stage 2 -  Partial thickness loss of dermis presenting as a shallow open injury with a red, pink wound bed without slough.  Wound Description (Comments):   Present on Admission:       DVT prophylaxis: Place and maintain sequential compression device Start: 05/26/20 0823 SCDs Start: 05/17/2020 0935   Diet:  Diet Orders (From admission, onward)    Start     Ordered   05/21/2020 0936  Diet 2 gram sodium Room service appropriate? Yes; Fluid consistency: Thin  Diet effective now       Question Answer Comment  Room service appropriate? Yes   Fluid consistency: Thin      05/13/2020 0936            Code Status: Full Code    Subjective 06/15/20    Patient seen at bedside this AM.  Reports having intermittent abdominal pain, no nausea/vomiting.  Requested to have Xanax as needed during the day, currently can get at bedtime.  Thinks Xanax will help him tolerate draining his abdomen.  No fever/chills.  Says his BM's have slowed down.  Disposition Plan & Communication   Status is: Inpatient  Remains inpatient appropriate because:SNF placement pending, pt too weak to safely return home   Dispo:  Patient From: Home  Planned Disposition: [place]  Expected discharge date: pending SNF bed  Medically stable for discharge: Yes     Family Communication: none at bedside on rounds, brother updated at bedside on 10/6    Consults, Procedures, Significant Events   Consultants:    Procedures:     Antimicrobials:  Anti-infectives (From admission, onward)   Start     Dose/Rate Route Frequency Ordered Stop   06/03/20 1000  ciprofloxacin (CIPRO) tablet 500 mg        500 mg Oral Daily 06/03/20 0748     05/29/20 1300  cefTRIAXone (ROCEPHIN) 2 g in sodium chloride 0.9 % 100 mL IVPB        2 g 200 mL/hr over 30 Minutes Intravenous  Once 05/28/20 1354 05/29/20 1323   05/27/20 1600  rifaximin (XIFAXAN) tablet 550 mg        550 mg Oral 2 times daily 05/27/20 1506     05/27/20 0900  cefTRIAXone (ROCEPHIN) 2 g in sodium chloride 0.9 % 100 mL IVPB  Status:  Discontinued        2 g 200 mL/hr over 30 Minutes Intravenous Every 24 hours 05/26/20 1149 05/28/20 1302   05/24/2020 1800  cefTRIAXone (ROCEPHIN) 2 g in sodium chloride 0.9 % 100 mL IVPB  Status:  Discontinued  2 g 200 mL/hr over 30 Minutes Intravenous Every 12 hours 06/01/2020 0936 05/26/20 1149   05/17/2020 0615  cefTRIAXone (ROCEPHIN) 2 g in sodium chloride 0.9 % 100 mL IVPB        2 g 200 mL/hr over 30 Minutes Intravenous  Once 05/23/2020 0604 05/21/2020 0736        Objective   Vitals:   06/15/20 0451 06/15/20 0757 06/15/20 0801 06/15/20 1223  BP: 119/70 (!) 89/49 124/68 (!) 108/59  Pulse: 91 85 87 85  Resp: 17 18  18   Temp: 97.6 F (36.4 C) (!) 97.5 F (36.4 C)  (!) 97.5 F (36.4 C)  TempSrc: Oral Oral  Oral  SpO2: 91% 96%  93%  Weight:      Height:        Intake/Output Summary (Last 24 hours) at 06/15/2020 1439 Last data filed at 06/15/2020 1108 Gross per 24 hour  Intake 600 ml  Output 100 ml  Net 500 ml   Filed Weights   05/30/20 0422 05/31/20 0332 06/02/20 0257  Weight: 71.5 kg 71.5 kg 71.4 kg    Physical Exam:  General exam: awake, alert, no  acute distress, cachectic Respiratory system: CTAB, normal respiratory effort, no wheezing or rhonchi. Cardiovascular system: normal S1/S2, RRR, no pedal edema.   Gastrointestinal system: distended, non-tender, peritoneal drain in place with clean/dry dressing intact. Extremities: no peripheral edema, moves all,  No cyanosis   Labs   Data Reviewed: I have personally reviewed following labs and imaging studies  CBC: Recent Labs  Lab 06/09/20 0535 06/10/20 0502 06/11/20 1817 06/13/20 1108 06/13/20 1721  WBC 10.8* 9.9 12.3* 8.5 6.1  NEUTROABS  --   --   --  6.8  --   HGB 13.5 13.3 12.8* 12.8* 11.7*  HCT 41.7 40.4 38.3* 40.3 36.3*  MCV 97.0 96.0 94.8 97.1 97.1  PLT 14* 16* 18* 23* 18*   Basic Metabolic Panel: Recent Labs  Lab 06/09/20 0535 06/10/20 0502 06/11/20 1817 06/13/20 1721  NA 136 137 133* 132*  K 4.7 3.9 4.5 4.5  CL 95* 95* 92* 93*  CO2 27 31 29 30   GLUCOSE 84 103* 108* 111*  BUN 33* 31* 36* 31*  CREATININE 1.09 1.13 1.11 0.95  CALCIUM 8.9 8.8* 8.5* 8.2*  MG 2.2 2.2 2.0 2.0   GFR: Estimated Creatinine Clearance: 79.3 mL/min (by C-G formula based on SCr of 0.95 mg/dL). Liver Function Tests: No results for input(s): AST, ALT, ALKPHOS, BILITOT, PROT, ALBUMIN in the last 168 hours. No results for input(s): LIPASE, AMYLASE in the last 168 hours. Recent Labs  Lab 06/12/20 1501  AMMONIA 27   Coagulation Profile: No results for input(s): INR, PROTIME in the last 168 hours. Cardiac Enzymes: No results for input(s): CKTOTAL, CKMB, CKMBINDEX, TROPONINI in the last 168 hours. BNP (last 3 results) No results for input(s): PROBNP in the last 8760 hours. HbA1C: No results for input(s): HGBA1C in the last 72 hours. CBG: No results for input(s): GLUCAP in the last 168 hours. Lipid Profile: No results for input(s): CHOL, HDL, LDLCALC, TRIG, CHOLHDL, LDLDIRECT in the last 72 hours. Thyroid Function Tests: No results for input(s): TSH, T4TOTAL, FREET4, T3FREE,  THYROIDAB in the last 72 hours. Anemia Panel: No results for input(s): VITAMINB12, FOLATE, FERRITIN, TIBC, IRON, RETICCTPCT in the last 72 hours. Sepsis Labs: No results for input(s): PROCALCITON, LATICACIDVEN in the last 168 hours.  Recent Results (from the past 240 hour(s))  Body fluid culture     Status:  None (Preliminary result)   Collection Time: 06/12/20  6:29 PM   Specimen: Peritoneal Washings; Body Fluid  Result Value Ref Range Status   Specimen Description   Final    PERITONEAL Performed at [facility], [address] [place], [place] [address]    Special Requests   Final    NONE Performed at [facility], [address] [place], [place] [address]    Gram Stain   Final    MODERATE WBC PRESENT,BOTH PMN AND MONONUCLEAR NO ORGANISMS SEEN    Culture   Final    NO GROWTH 2 DAYS Performed at [facility], [place] [address], [place], [place] [address]    Report Status PENDING  Incomplete      Imaging Studies   No results found.   Medications   Scheduled Meds: . baclofen  10 mg Oral TID  . ciprofloxacin  500 mg Oral Daily  . collagenase   Topical Daily  . feeding supplement (ENSURE ENLIVE)  237 mL Oral TID BM  . folic acid  1 mg Oral Daily  . furosemide  40 mg Oral BID  . lactulose  20 g Oral BID  . loratadine  10 mg Oral Daily  . magnesium oxide  400 mg Oral Daily  . morphine  15 mg Oral Q12H  . multivitamin with minerals  1 tablet Oral Daily  . pantoprazole  40 mg Oral BID  . rifaximin  550 mg Oral BID  . sodium chloride flush  3 mL Intravenous Q12H  . spironolactone  25 mg Oral Daily   Continuous Infusions: . sodium chloride    . sodium chloride Stopped (05/30/2020 2338)       LOS: 21 days    Time spent: 20 minutes    John Slocumb, DO [facility]  06/15/2020, 2:39 PM    If 7PM-7AM, please contact night-coverage. How to contact the [facility] Attending or Consulting provider [place] or covering provider during  after hours [place], for this patient?    1. Check the care team in [facility] and look for a) attending/consulting [facility] provider listed and b) the [facility] team listed 2. Log into [email or web address] and use [place]'s universal password to access. If you do not have the password, please contact the hospital operator. 3. Locate the [facility] provider you are looking for under [facility] and page to a number that you can be directly reached. 4. If you still have difficulty reaching the provider, please page the [facility] (Director on Call) for the Hospitalists listed on amion for assistance.

## 2020-06-16 DIAGNOSIS — C221 Intrahepatic bile duct carcinoma: Secondary | ICD-10-CM | POA: Diagnosis not present

## 2020-06-16 DIAGNOSIS — D696 Thrombocytopenia, unspecified: Secondary | ICD-10-CM | POA: Diagnosis not present

## 2020-06-16 DIAGNOSIS — C220 Liver cell carcinoma: Secondary | ICD-10-CM | POA: Diagnosis not present

## 2020-06-16 DIAGNOSIS — G893 Neoplasm related pain (acute) (chronic): Secondary | ICD-10-CM | POA: Diagnosis not present

## 2020-06-16 LAB — CBC
HCT: 36.8 % — ABNORMAL LOW (ref 39.0–52.0)
Hemoglobin: 11.9 g/dL — ABNORMAL LOW (ref 13.0–17.0)
MCH: 31.9 pg (ref 26.0–34.0)
MCHC: 32.3 g/dL (ref 30.0–36.0)
MCV: 98.7 fL (ref 80.0–100.0)
Platelets: 24 10*3/uL — CL (ref 150–400)
RBC: 3.73 MIL/uL — ABNORMAL LOW (ref 4.22–5.81)
RDW: 20.7 % — ABNORMAL HIGH (ref 11.5–15.5)
WBC: 7.1 10*3/uL (ref 4.0–10.5)
nRBC: 0 % (ref 0.0–0.2)

## 2020-06-16 LAB — BODY FLUID CULTURE: Culture: NO GROWTH

## 2020-06-16 LAB — BASIC METABOLIC PANEL
Anion gap: 8 (ref 5–15)
BUN: 36 mg/dL — ABNORMAL HIGH (ref 8–23)
CO2: 32 mmol/L (ref 22–32)
Calcium: 8.5 mg/dL — ABNORMAL LOW (ref 8.9–10.3)
Chloride: 93 mmol/L — ABNORMAL LOW (ref 98–111)
Creatinine, Ser: 0.95 mg/dL (ref 0.61–1.24)
GFR, Estimated: >60 mL/min (ref >60)
Glucose, Bld: 114 mg/dL — ABNORMAL HIGH (ref 70–99)
Potassium: 4.6 mmol/L (ref 3.5–5.1)
Sodium: 133 mmol/L — ABNORMAL LOW (ref 135–145)

## 2020-06-16 LAB — MAGNESIUM: Magnesium: 2.2 mg/dL (ref 1.7–2.4)

## 2020-06-16 NOTE — Plan of Care (Signed)
Patient refused dressing change and Pleurex drain to be drained.  Patient ate approximately 50% meals.  Patient intermittantly confused.

## 2020-06-16 NOTE — Progress Notes (Signed)
Mobility Specialist - Progress Note   06/16/20 1124  Mobility  Activity Ambulated in room  Range of Motion/Exercises All extremities (slr, ankle pumps, seated march, UE stretching)  Level of Assistance Standby assist, set-up cues, supervision of patient - no hands on  Assistive Device Front wheel walker  Distance Ambulated (ft) 20 ft  Mobility Response Tolerated well  Mobility performed by Mobility specialist  $Mobility charge 1 Mobility    Pre-mobility: 91 HR, 93% SpO2 Post-mobility: 89 HR, 95% SpO2   Pt was sleeping in bed upon arrival. Pt was easily awakened and agreed to session. Pt was SBA getting EOB where he began c/o feeling "swimmy-headed". Pt stated that he's never felt like this before, despite telling me that this feeling often comes and goes in previous sessions, including this date. However, pt was motivated to continue session. Pt performed seated exercises: ankle pumps, straight leg raises, seated march, and UE stretching with no physical assistance. Pt ambulated 20' around room with SBA and no LOB. Overall, pt tolerated session well. Pt was left in bed with all needs in reach and alarm set. NT entered at the end of session.   Kathee Delton Mobility Specialist 06/16/20, 11:31 AM

## 2020-06-16 NOTE — Care Management Important Message (Signed)
Important Message  Patient Details  Name: John [name] MRN: [id number] Date of Birth: Jan 06, 1956   Medicare Important Message Given:  Yes     Dannette Barbara 06/16/2020, 12:02 PM

## 2020-06-16 NOTE — Progress Notes (Signed)
Pt has declined draining of his PleurX ascitic drain overnight although Xanax was given. He would like to have it done during the day when his MD is present as he thinks he does not need it urgently at the moment. Reminded that it was last drained 10/7, but he still declined at this time.

## 2020-06-16 NOTE — Progress Notes (Signed)
PT Cancellation Note  Patient Details Name: DEIVI HUCKINS MRN: [id number] DOB: Jan 20, 1956   Cancelled Treatment:    Reason Eval/Treat Not Completed: Other (comment) Pt in bed finishing eating at this time. Pt reports that he just finished with physical therapy. Upon questioning, pt reported that the staff member was wearing "tan". Clarified that pt worked with mobility specialist. Pt reporting not wanting to participate at this time secondary to fatigue from just having walked a little bit before clinician arrival, however is agreeable to participate another day. Will continue to follow and attempt another date.   Ivonne [place] 06/16/2020, 1:10 PM

## 2020-06-16 NOTE — Progress Notes (Signed)
PROGRESS NOTE    John [name]   JOA:[id number]  DOB: 1956-03-01  PCP: Cletis Athens, MD    DOA: 05/22/2020 LOS: 23   Brief Narrative   Summary from Dr. Billie Ruddy 10/5: "John [name] is a 64 y.o. 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      Patient receives all his oncological care at [facility] and had requested transfer to [facility].  However, there were no beds available so he was managed at [facility].   He was found to have spontaneous bacterial peritonitis.  Ascitic fluid WBC was consistent with SBP and ascitic fluid culture showed Streptococcus mitis/oralis. He completed 5-day course of IV Rocephin on 05/29/2020.  He was previously on ciprofloxacin for SBP prophylaxis and this has been resumed.   He has mixed hepatocellular carcinoma/cholangiocarcinoma and he is status post Y 90 radioembolization in 2019 followed by Gem/Cis and capecitabine monotherapy with disease progression currently on Bev/Atezo. Last dose on 03/26/2020.  Next chemo was planned for 9/22 @ [facility] but this has been postponed because of recent acute illness.  He has a right lower quadrant percutaneous drain for drainage of ascitic fluid 3 times a week on Tuesdays, Thursdays and Saturdays.   He was also treated with lactulose and rifaximin for hepatic encephalopathy.  He has portal vein thrombosis but unfortunately he is not a candidate for anticoagulation because of severe thrombocytopenia.   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   --cont lactulose 20g BID and rifaximin --Ensure 2-3 BM's per day 10/11: pt reports BM x 2 yesterday that he recalls, not confused or lethargic   Myoclonic jerks - intermittent.  Suspect due to hyperammonemia / metabolic.  Ammonia level normal at 27 on 10/7 when jerking was particularly bothersome to patient.  Continue lactulose, ensure at least 2-3 soft BM's daily.  Monitor. 10/11: has not reported for past few days, but I notice jerks again intermittently during encounter  --will check ammonia level periodically if worsening symptoms or confusion  Recurrent ascites status post peritoneal catheter which placed on09/01 10/11 pt has declined to have abdomen drained for past couple days, was scheduled this past Saturday.  We discussed and will adjust schedule to twice weekly PRN on Tue/Sat. --drain ascites TWICE WEEKLY AS NEEDED on Tue/Sat, up to 3L at one time.   --may give Xanax and/or pain medication prior to draining  --cont Lasix and Aldactone  --drained on 10/7, fluid sent for cell counts - 64 neut's, culture pending ( neg to date ) --follow up ascites culture from 10/7 -negative  SBP, POA, treated & resolved - Ascitic fluid WBC was consistent with SBP and ascitic fluid culture showed Streptococcus mitis/oralis.  He completed 5-day course  of IV Rocephin on 05/29/2020. No abdominal pain. --cont Cipro for SBP ppx --monitor closely for recurrence  Mixed hepatocellular carcinoma/cholangiocarcinoma - has been followed at [facility]. --pt on home avatrombopag.  Hospital doesn't have this  medication.  Family unable to bring it to the hospital. Patient now agreeable to have oncology care transferred here, locally.   Dr. Grayland Ormond notified and will arrange follow up in [place] after discharge.  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 --reduce lab checks to every other day  GERD / Heartburn / Indigestion - continue Protonix (takes omeprazole at home), PRN Tums or Mylanta  Pressure injury of Sacrum - undetermined if present on admission. Pressure Injury 05/29/20 Sacrum Stage 2 -  Partial thickness loss of dermis presenting as a shallow open injury with a red, pink wound bed without slough. (Active)  05/29/20 1024  Location: Sacrum  Location Orientation:   Staging: Stage 2 -  Partial thickness loss of dermis presenting as a shallow open injury with a red, pink wound bed without slough.  Wound Description (Comments):   Present on Admission:       DVT prophylaxis: Place and maintain sequential compression device Start: 05/26/20 0823 SCDs Start: 05/18/2020 0935   Diet:  Diet Orders (From admission, onward)    Start     Ordered   05/15/2020 0936  Diet 2 gram sodium Room service appropriate? Yes; Fluid consistency: Thin  Diet effective now       Question Answer Comment  Room service appropriate? Yes   Fluid consistency: Thin      05/26/2020 0936            Code Status: Full Code    Subjective 06/16/20    Patient seen at bedside this AM.  Says he doesn't feel great today, says he just feels anxious.  Heartburn is better with PPI increased to BID dosing yesterday.  No chest pain.  Abdominal pain is intermittent.  No other acute complaints.    Disposition Plan & Communication   Status is: Inpatient  Remains inpatient appropriate because:SNF placement pending, pt too weak to safely return  home   Dispo:  Patient From: Home  Planned Disposition: [place]  Expected discharge date: pending SNF bed  Medically stable for discharge: Yes     Family Communication: none at bedside on rounds, brother updated at bedside on 10/6    Consults, Procedures, Significant Events   Consultants:    Procedures:     Antimicrobials:  Anti-infectives (From admission, onward)   Start     Dose/Rate Route Frequency Ordered Stop   06/03/20 1000  ciprofloxacin (CIPRO) tablet 500 mg        500 mg Oral Daily 06/03/20 0748     05/29/20 1300  cefTRIAXone (ROCEPHIN) 2 g in sodium chloride 0.9 % 100 mL IVPB        2 g 200 mL/hr over 30 Minutes Intravenous  Once 05/28/20 1354 05/29/20 1323   05/27/20 1600  rifaximin (XIFAXAN) tablet 550 mg        550 mg Oral 2 times daily 05/27/20 1506     05/27/20 0900  cefTRIAXone (ROCEPHIN) 2 g in sodium chloride 0.9 % 100 mL IVPB  Status:  Discontinued        2 g 200 mL/hr over 30 Minutes Intravenous Every 24 hours 05/26/20 1149 05/28/20 1302   05/17/2020 1800  cefTRIAXone (ROCEPHIN) 2 g in sodium chloride 0.9 % 100 mL IVPB  Status:  Discontinued        2 g 200 mL/hr over 30 Minutes Intravenous Every 12 hours 05/24/2020 0936 05/26/20 1149   05/17/2020 0615  cefTRIAXone (ROCEPHIN) 2 g in sodium chloride 0.9 % 100 mL IVPB        2 g 200 mL/hr over 30 Minutes Intravenous  Once 05/31/2020 0604 05/24/2020 0736        Objective   Vitals:   06/15/20 2340 06/16/20 0455 06/16/20 0901 06/16/20 1109  BP: (!) 101/57 133/64 (!) 93/41 122/61  Pulse: 85 88 86 87  Resp: 16 18 18    Temp: 97.7 F (36.5 C) 97.6 F (36.4 C) (!) 97.4 F (36.3 C) 97.7 F (36.5 C)  TempSrc: Oral Oral Oral   SpO2: 100% 99% 95% 98%  Weight:      Height:        Intake/Output Summary (Last 24 hours) at 06/16/2020 1457 Last data filed at 06/16/2020 1414 Gross per 24 hour  Intake 600 ml  Output 450 ml  Net 150 ml   Filed Weights   05/30/20 0422 05/31/20 0332 06/02/20  0257  Weight: 71.5 kg 71.5 kg 71.4 kg    Physical Exam:  General exam: awake, alert, no acute distress, cachectic Respiratory system: CTAB, normal respiratory effort, no wheezing or rhonchi. Cardiovascular system: normal S1/S2, RRR, no pedal edema.   Gastrointestinal system: distended, non-tender, peritoneal drain in place with clean/dry dressing intact. Extremities: no peripheral edema, moves all,  No cyanosis   Labs   Data Reviewed: I have personally reviewed following labs and imaging studies  CBC: Recent Labs  Lab 06/10/20 0502 06/11/20 1817 06/13/20 1108 06/13/20 1721 06/16/20 0930  WBC 9.9 12.3* 8.5 6.1 7.1  NEUTROABS  --   --  6.8  --   --   HGB 13.3 12.8* 12.8* 11.7* 11.9*  HCT 40.4 38.3* 40.3 36.3* 36.8*  MCV 96.0 94.8 97.1 97.1 98.7  PLT 16* 18* 23* 18* 24*   Basic Metabolic Panel: Recent Labs  Lab 06/10/20 0502 06/11/20 1817 06/13/20 1721 06/16/20 0930  NA 137 133* 132* 133*  K 3.9 4.5 4.5 4.6  CL 95* 92* 93* 93*  CO2 31 29 30  32  GLUCOSE 103* 108* 111* 114*  BUN 31* 36* 31* 36*  CREATININE 1.13 1.11 0.95 0.95  CALCIUM 8.8* 8.5* 8.2* 8.5*  MG 2.2 2.0 2.0 2.2   GFR: Estimated Creatinine Clearance: 79.3 mL/min (by C-G formula based on SCr of 0.95 mg/dL). Liver Function Tests: No results for input(s): AST, ALT, ALKPHOS, BILITOT, PROT, ALBUMIN in the last 168 hours. No results for input(s): LIPASE, AMYLASE in the last 168 hours. Recent Labs  Lab 06/12/20 1501  AMMONIA 27   Coagulation Profile: No results for input(s): INR, PROTIME in the last 168 hours. Cardiac Enzymes: No results for input(s): CKTOTAL, CKMB, CKMBINDEX, TROPONINI in the last 168 hours. BNP (last 3 results) No results for input(s): PROBNP in the last 8760 hours. HbA1C: No results for input(s): HGBA1C in the last 72 hours. CBG: No results for input(s): GLUCAP in the last 168 hours. Lipid Profile: No results for input(s): CHOL, HDL, LDLCALC, TRIG, CHOLHDL, LDLDIRECT in the last  72 hours. Thyroid Function Tests: No results for input(s): TSH, T4TOTAL, FREET4, T3FREE, THYROIDAB in the last 72 hours. Anemia Panel: No results for input(s): VITAMINB12, FOLATE, FERRITIN, TIBC, IRON, RETICCTPCT in the last 72 hours. Sepsis Labs: No results for  input(s): PROCALCITON, LATICACIDVEN in the last 168 hours.  Recent Results (from the past 240 hour(s))  Body fluid culture     Status: None   Collection Time: 06/12/20  6:29 PM   Specimen: Peritoneal Washings; Body Fluid  Result Value Ref Range Status   Specimen Description   Final    PERITONEAL Performed at [facility], [address] [place], [place] [address]    Special Requests   Final    NONE Performed at [facility], [address] [place], [place] [address]    Gram Stain   Final    MODERATE WBC PRESENT,BOTH PMN AND MONONUCLEAR NO ORGANISMS SEEN    Culture   Final    NO GROWTH 3 DAYS Performed at [facility], [place] [address], [place], [place] [address]    Report Status 06/16/2020 FINAL  Final      Imaging Studies   No results found.   Medications   Scheduled Meds: . baclofen  10 mg Oral TID  . ciprofloxacin  500 mg Oral Daily  . feeding supplement (ENSURE ENLIVE)  237 mL Oral TID BM  . folic acid  1 mg Oral Daily  . furosemide  40 mg Oral BID  . lactulose  20 g Oral BID  . loratadine  10 mg Oral Daily  . magnesium oxide  400 mg Oral Daily  . morphine  15 mg Oral Q12H  . multivitamin with minerals  1 tablet Oral Daily  . pantoprazole  40 mg Oral BID  . rifaximin  550 mg Oral BID  . sodium chloride flush  3 mL Intravenous Q12H  . spironolactone  25 mg Oral Daily   Continuous Infusions: . sodium chloride    . sodium chloride Stopped (05/31/2020 2338)       LOS: 22 days    Time spent: 20 minutes    Ezekiel Slocumb, DO [facility]  06/16/2020, 2:57 PM    If 7PM-7AM, please contact night-coverage. How to contact the [facility] Attending or Consulting  provider [place] or covering provider during after hours [place], for this patient?    1. Check the care team in [facility] and look for a) attending/consulting TRH provider listed and b) the [facility] team listed 2. Log into [email or web address] and use [place]'s universal password to access. If you do not have the password, please contact the hospital operator. 3. Locate the [facility] provider you are looking for under [facility] and page to a number that you can be directly reached. 4. If you still have difficulty reaching the provider, please page the [facility] (Director on Call) for the Hospitalists listed on amion for assistance.

## 2020-06-16 NOTE — TOC Progression Note (Addendum)
Transition of Care [facility]) - Progression Note    Patient Details  Name: John [name] MRN: [id number] Date of Birth: 1956/05/20  Transition of Care [facility]) CM/SW [place], LCSW Phone Number: 06/16/2020, 9:42 AM  Clinical Narrative:  [facility] is able to offer patient a bed. CSW met with patient to discuss. He does not remember talking to MD and his brother about switching his chemo to [facility]. Patient reported he does not have any out-of-pocket cost for chemo at [facility] and if he goes anywhere else, he has to pay $47,000 out of pocket. Patient goes to chemo once every 3 weeks. Sent secure chat to Dr. Arbutus Ped and Dr. Grayland Ormond to notify.   11:53 am: Dr. Grayland Ormond said it sounds like patient may be getting some sort of grant at [facility]. If so, will continue to try [facility]. Sent referral to [facility] in [place]. Left voicemails for admissions coordinators at [facility] and [place]. [facility] is not in network with his insurance.  1:57 pm: Discussed case with [facility] admissions coordinator and faxed referral for her to review.  3:56 pm: Left voicemail for [facility] admissions coordinator to confirm she received referral. [facility] admissions coordinator confirmed she received referral.  Expected Discharge Plan: [place] Barriers to Discharge: No SNF bed, Insurance Authorization  Expected Discharge Plan and Services Expected Discharge Plan: [place] In-house Referral: Clinical Social Work   Post Acute Care Choice: [place] Living arrangements for the past 2 months: Single Family Home                                       Social Determinants of Health (SDOH) Interventions    Readmission Risk Interventions No flowsheet data found.

## 2020-06-17 DIAGNOSIS — C220 Liver cell carcinoma: Secondary | ICD-10-CM | POA: Diagnosis not present

## 2020-06-17 DIAGNOSIS — G893 Neoplasm related pain (acute) (chronic): Secondary | ICD-10-CM | POA: Diagnosis not present

## 2020-06-17 DIAGNOSIS — D696 Thrombocytopenia, unspecified: Secondary | ICD-10-CM | POA: Diagnosis not present

## 2020-06-17 DIAGNOSIS — C221 Intrahepatic bile duct carcinoma: Secondary | ICD-10-CM | POA: Diagnosis not present

## 2020-06-17 NOTE — TOC Progression Note (Addendum)
Transition of Care [facility]) - Progression Note    Patient Details  Name: John [name] MRN: [id number] Date of Birth: [birth year]  Transition of Care [place] [facility]) CM/SW [place], LCSW Phone Number: 06/17/2020, 2:09 PM  Clinical Narrative:  [facility] is checking to see if they can manage his drain or not. They also said we will have to request an insurance carve out for his chemo because the grant is likely only valid when getting treatment from home.  2:34 pm: [facility] did not receive referral that was faxed over yesterday. CSW refaxed.  Expected Discharge Plan: [place] Barriers to Discharge: No SNF bed, Insurance Authorization  Expected Discharge Plan and Services Expected Discharge Plan: [place] In-house Referral: Clinical Social Work   Post Acute Care Choice: [place] Living arrangements for the past 2 months: Single Family Home                                       Social Determinants of Health (SDOH) Interventions    Readmission Risk Interventions No flowsheet data found.

## 2020-06-17 NOTE — Progress Notes (Signed)
Patient complaining of nausea. Zofran given with some relief. Encouraged patient to allow nursing to drain peritoneal catheter. Patient refused.   Fuller Mandril, RN

## 2020-06-17 NOTE — Progress Notes (Signed)
PT Cancellation Note  Patient Details Name: John [name] MRN: [id number] DOB: 18-Aug-1956   Cancelled Treatment:     PT attempt, OT currently in room attempting to motivate pt for particpation. Will return at later time/date.    Willette Pa 06/17/2020, 2:35 PM

## 2020-06-17 NOTE — Progress Notes (Signed)
Occupational Therapy Treatment Patient Details Name: John [name] MRN: [id number] DOB: 12-25-1955 Today's Date: 06/17/2020    History of present illness 64 y.o. male with medical history significant for hepatocellular carcinoma and cholangiocarcinoma on bevacizumab/atezolizumab complicated by recurrent ascites status post peritoneal catheter which was placed on 09/01, history of chronic alcoholic pancreatitis, hepatitis B, COPD, seizure disorder who presented to the ED via EMS for evaluation of worsening abdominal pain   OT comments  Upon entering the room, pt supine in bed and just generally disgruntled by therapist presence and encouragement for therapeutic intervention. Pt scooted down into the bottom of the bed and allowed therapist to assist with repositioning. Pt then reports, " I gotta take a shit". He abruptly begins getting out of bed and pushing RW away from him when therapist places it for standing. Pt performed stand pivot transfer onto [facility] and had BM. Pt refusal to perform hygiene. Pt standing with min guard and total A for toilet hygiene needs. Pt transfers back to bed , in same manner as above, and performs sit>supine with close supervision. All needs within reach. OT again recommends SNF after discharge from hospital to address functional deficits. Pt verbalized understanding and agreement. All needs within reach.    Follow Up Recommendations  SNF    Equipment Recommendations  3 in 1 bedside commode       Precautions / Restrictions Precautions Precautions: Fall       Mobility Bed Mobility Overal bed mobility: Needs Assistance Bed Mobility: Sit to Supine Rolling: Supervision   Supine to sit: Supervision Sit to supine: HOB elevated;Supervision   General bed mobility comments: min cuing for hand placement and technique  Transfers Overall transfer level: Needs assistance Equipment used: Rolling walker (2 wheeled) Transfers: Sit to/from Stand Sit to Stand: Min  guard         General transfer comment: CGA for sit <> stand transfer for steadying    Balance Overall balance assessment: Needs assistance Sitting-balance support: No upper extremity supported;Feet supported Sitting balance-Leahy Scale: Good Sitting balance - Comments: no LOB   Standing balance support: Single extremity supported;During functional activity Standing balance-Leahy Scale: Fair Standing balance comment: CGA for balance         ADL either performed or assessed with clinical judgement   ADL Overall ADL's : Needs assistance/impaired       Toilet Transfer: Minimal assistance;BSC;Ambulation;RW   Toileting- Clothing Manipulation and Hygiene: Moderate assistance Toileting - Clothing Manipulation Details (indicate cue type and reason): Pt refusing to perform hygiene needs             Vision Patient Visual Report: No change from baseline            Cognition Arousal/Alertness: Awake/alert Behavior During Therapy: WFL for tasks assessed/performed Overall Cognitive Status: No family/caregiver present to determine baseline cognitive functioning       General Comments: Pt showing limited insight to deficits. Poor safety awareness throughout session and initial refusal of all tasks.                   Pertinent Vitals/ Pain       Pain Assessment: Faces Faces Pain Scale: Hurts a little bit Pain Location: back Pain Descriptors / Indicators: Aching;Discomfort;Grimacing Pain Intervention(s): Limited activity within patient's tolerance;Monitored during session;Repositioned         Frequency  Min 1X/week        Progress Toward Goals  OT Goals(current goals can now be found in the care plan section)  Progress towards OT goals: Progressing toward goals  Acute Rehab OT Goals Patient Stated Goal: to take a nap OT Goal Formulation: With patient Time For Goal Achievement: 06/27/20 Potential to Achieve Goals: Good  Plan Discharge plan remains  appropriate;Frequency remains appropriate       AM-PAC OT "6 Clicks" Daily Activity     Outcome Measure   Help from another person eating meals?: None Help from another person taking care of personal grooming?: A Little Help from another person toileting, which includes using toliet, bedpan, or urinal?: A Little Help from another person bathing (including washing, rinsing, drying)?: A Little Help from another person to put on and taking off regular upper body clothing?: A Little Help from another person to put on and taking off regular lower body clothing?: A Little 6 Click Score: 19    End of Session Equipment Utilized During Treatment: Rolling walker  OT Visit Diagnosis: Other abnormalities of gait and mobility (R26.89);Muscle weakness (generalized) (M62.81);Pain Pain - part of body:  (back)   Activity Tolerance Patient tolerated treatment well   Patient Left in bed;Other (comment)   Nurse Communication Mobility status        Time: 4196-2229 OT Time Calculation (min): 38 min  Charges: OT General Charges $OT Visit: 1 Visit OT Treatments $Self Care/Home Management : 38-52 mins  Darleen Crocker, MS, OTR/L , CBIS ascom [phone]  06/17/20, 3:33 PM

## 2020-06-17 NOTE — Progress Notes (Signed)
Nutrition Follow Up Note   DOCUMENTATION CODES:   Severe malnutrition in context of chronic illness  INTERVENTION:   Ensure Enlive po TID, each supplement provides 350 kcal and 20 grams of protein  Magic cup TID with meals, each supplement provides 290 kcal and 9 grams of protein  MVI daily   NUTRITION DIAGNOSIS:   Severe Malnutrition related to chronic illness (hepatocellular carcinoma, COPD) as evidenced by 23 percent weight loss in 9 months, severe muscle depletion, severe fat depletion.  GOAL:   Patient will meet greater than or equal to 90% of their needs -progressing   MONITOR:   PO intake, Supplement acceptance, Labs, Weight trends, Skin, I & O's  ASSESSMENT:   64 y.o. male  with medical history significant for hepatocellular carcinoma and cholangiocarcinoma on bevacizumab/atezolizumab complicated by recurrent ascites status post peritoneal catheter which was placed on 09/01, history of chronic alcoholic pancreatitis, hepatitis B, COPD and seizure disorder who is admitted with SBP   Pt continues to have fairly good appetite and oral intake in hospital. Pt eating 50-100% of meals and is drinking some Ensure but is refusing most of it. Magic Cups being sent on meal trays. It is difficult to determine if pt has had any weight loss in hospital as pt's weights vary r/t ascites. Recommend continue supplements and daily MVI.   Medications reviewed and include: ciprofloxacin, folic acid, lasix, lactulose, Mg oxide, morphine, MVI, protonix, aldactone  Labs reviewed: Na 133(L), BUN 36(H), Mg 2.2 wnl  Diet Order:   Diet Order            Diet 2 gram sodium Room service appropriate? Yes; Fluid consistency: Thin  Diet effective now                EDUCATION NEEDS:   Education needs have been addressed  Skin:  Skin Assessment: Reviewed RN Assessment (needle puncture 1cm x 2cm, sternal area 2cm x 1cm x 0.1cm, Stage II sacrum)  Last BM:  10/12- type 4  Height:   Ht  Readings from Last 1 Encounters:  05/19/2020 6' (1.829 m)    Weight:   Wt Readings from Last 1 Encounters:  06/17/20 78.3 kg    Ideal Body Weight:  80.9 kg  BMI:  Body mass index is 23.41 kg/m.  Estimated Nutritional Needs:   Kcal:  2100-2400kcal/day  Protein:  110-120g/day  Fluid:  2.1L/day  Koleen Distance MS, RD, LDN Please refer to [facility] for RD and/or RD on-call/weekend/after hours pager

## 2020-06-17 NOTE — Progress Notes (Signed)
PROGRESS NOTE    John [name]   PXT:[id number]  DOB: 10-19-1955  PCP: Cletis Athens, MD    DOA: 05/11/2020 LOS: 23   Brief Narrative   Summary from Dr. Billie Ruddy 10/5.  Reviewed records and agree with summary: "John [name] is a 64 y.o. 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      Patient receives all his oncological care at [facility] and had requested transfer to [facility].  However, there were no beds available so he was managed at [facility].   He was found to have spontaneous bacterial peritonitis.  Ascitic fluid WBC was consistent with SBP and ascitic fluid culture showed Streptococcus mitis/oralis. He completed 5-day course of IV Rocephin on 05/29/2020.  He was previously on ciprofloxacin for SBP prophylaxis and this has been resumed.   He has mixed hepatocellular carcinoma/cholangiocarcinoma and he is status post Y 90 radioembolization in 2019 followed by Gem/Cis and capecitabine monotherapy with disease progression currently on Bev/Atezo. Last dose on 03/26/2020.  Next chemo was planned for 9/22 @ [facility] but this has been postponed because of recent acute illness.  He has a right lower quadrant percutaneous drain for drainage of ascitic fluid 3 times a week on Tuesdays, Thursdays and Saturdays.   He was also treated with lactulose and rifaximin for hepatic encephalopathy.  He has portal vein thrombosis but unfortunately he is not a candidate for anticoagulation because of severe thrombocytopenia.   He was evaluated by PT and OT recommended further rehabilitation at a skilled nursing facility."  Awaiting SNF placement which has been complicated by the need for transportation to  oncology appointments.     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   --cont lactulose 20g BID and rifaximin --Ensure 2-3 BM's per day 10/11-12: pt reports having couple BM's daily that he recalls, not confused or lethargic   Myoclonic jerks - intermittent.  Suspect due to hyperammonemia / metabolic.  Ammonia level normal at 27 on 10/7 when jerking was particularly bothersome to patient.  Continue lactulose, ensure at least 2-3 soft BM's daily.  Monitor. 10/11: has not reported for past few days, but I notice jerks again intermittently during encounter  --check ammonia level periodically if worsening symptoms or confusion  Recurrent ascites status post peritoneal catheter which placed on09/01 10/11 pt has declined to have abdomen drained for past couple days, was scheduled this past Saturday.  We discussed and will adjust schedule to twice weekly PRN on Tue/Sat. --drain ascites TWICE WEEKLY AS NEEDED on Tue/Sat, up to 3L at one time.   --if any leukocytosis or fevers - send fluid for cell counts and culture --may give Xanax and/or pain medication prior to draining  --cont Lasix and Aldactone  --drained on 10/7, fluid sent for cell counts - 64 neut's, culture sent --follow up ascites  culture from 10/7 -negative  SBP, POA, treated & resolved - Ascitic fluid WBC was consistent with SBP and ascitic fluid culture showed Streptococcus mitis/oralis.  He completed 5-day course of IV Rocephin on 05/29/2020. No abdominal  pain. --cont Cipro for SBP ppx --monitor closely for recurrence  Mixed hepatocellular carcinoma/cholangiocarcinoma - has been followed at [facility]. --pt on home avatrombopag.  Hospital doesn't have this medication.  Family unable to bring it to the hospital. Patient now agreeable to have oncology care transferred here, locally.   Dr. Grayland Ormond notified and will arrange follow up in [place] after discharge.  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 --reduce lab checks to every other day  GERD / Heartburn / Indigestion - continue Protonix (takes omeprazole at home), PRN Tums or Mylanta  Pressure injury of Sacrum - undetermined if present on admission. Pressure Injury 05/29/20 Sacrum Stage 2 -  Partial thickness loss of dermis presenting as a shallow open injury with a red, pink wound bed without slough. (Active)  05/29/20 1024  Location: Sacrum  Location Orientation:   Staging: Stage 2 -  Partial thickness loss of dermis presenting as a shallow open injury with a red, pink wound bed without slough.  Wound Description (Comments):   Present on Admission:       DVT prophylaxis: Place and maintain sequential compression device Start: 05/26/20 0823 SCDs Start: 05/14/2020 0935   Diet:  Diet Orders (From admission, onward)    Start     Ordered   05/17/2020 0936  Diet 2 gram sodium Room service appropriate? Yes; Fluid consistency: Thin  Diet effective now       Question Answer Comment  Room service appropriate? Yes   Fluid consistency: Thin      06/04/2020 0936            Code Status: Full Code    Subjective 06/17/20    Patient seen at bedside this AM.  He declined abdomen to be drained yesterday.  Discussed the change in schedule to bi-weekly and PRN.  He otherwise has no acute complaints.  Says heartburn better since  Protonix made BID.  No fever/chills.  Says still having myoclonic jerks that aggravate him, but not seen during our encounter past few days.   Disposition Plan & Communication   Status is: Inpatient  Remains inpatient appropriate because:SNF placement pending, pt too weak to safely return home   Dispo:  Patient From: Home  Planned Disposition: [place]  Expected discharge date: pending SNF bed  Medically stable for discharge: Yes     Family Communication: none at bedside on rounds, brother updated at bedside on 10/6    Consults, Procedures, Significant Events   Consultants:    Procedures:     Antimicrobials:  Anti-infectives (From admission, onward)   Start     Dose/Rate Route Frequency Ordered Stop   06/03/20 1000  ciprofloxacin (CIPRO) tablet 500 mg        500 mg Oral Daily 06/03/20 0748     05/29/20 1300  cefTRIAXone (ROCEPHIN) 2 g in sodium chloride 0.9 % 100 mL IVPB        2 g 200 mL/hr over 30 Minutes Intravenous  Once 05/28/20 1354 05/29/20 1323   05/27/20 1600  rifaximin (XIFAXAN) tablet 550 mg        550 mg Oral 2 times daily 05/27/20 1506  05/27/20 0900  cefTRIAXone (ROCEPHIN) 2 g in sodium chloride 0.9 % 100 mL IVPB  Status:  Discontinued        2 g 200 mL/hr over 30 Minutes Intravenous Every 24 hours 05/26/20 1149 05/28/20 1302   06/05/2020 1800  cefTRIAXone (ROCEPHIN) 2 g in sodium chloride 0.9 % 100 mL IVPB  Status:  Discontinued        2 g 200 mL/hr over 30 Minutes Intravenous Every 12 hours 05/10/2020 0936 05/26/20 1149   05/13/2020 0615  cefTRIAXone (ROCEPHIN) 2 g in sodium chloride 0.9 % 100 mL IVPB        2 g 200 mL/hr over 30 Minutes Intravenous  Once 05/16/2020 0604 06/03/2020 0736        Objective   Vitals:   06/17/20 0608 06/17/20 0945 06/17/20 1135 06/17/20 1150  BP: (!) 93/55 (!) 110/53 110/66   Pulse: 93 89 89   Resp: 16 15 16    Temp: 98.5 F (36.9 C) 98.2 F (36.8 C) (!) 97.5 F (36.4 C)   TempSrc:  Oral Oral   SpO2:  98% 95% 99%   Weight:    78.3 kg  Height:        Intake/Output Summary (Last 24 hours) at 06/17/2020 1415 Last data filed at 06/17/2020 1344 Gross per 24 hour  Intake 1090 ml  Output 400 ml  Net 690 ml   Filed Weights   05/31/20 0332 06/02/20 0257 06/17/20 1150  Weight: 71.5 kg 71.4 kg 78.3 kg    Physical Exam:  General exam: awake, alert, no acute distress, cachectic Respiratory system: CTAB, normal respiratory effort, on room air Cardiovascular system: normal S1/S2, RRR, no pedal edema.   Gastrointestinal system: distended, not tender, no guarding or rebound, peritoneal drain in place. Extremities: no peripheral edema, moves all,  No cyanosis   Labs   Data Reviewed: I have personally reviewed following labs and imaging studies  CBC: Recent Labs  Lab 06/11/20 1817 06/13/20 1108 06/13/20 1721 06/16/20 0930  WBC 12.3* 8.5 6.1 7.1  NEUTROABS  --  6.8  --   --   HGB 12.8* 12.8* 11.7* 11.9*  HCT 38.3* 40.3 36.3* 36.8*  MCV 94.8 97.1 97.1 98.7  PLT 18* 23* 18* 24*   Basic Metabolic Panel: Recent Labs  Lab 06/11/20 1817 06/13/20 1721 06/16/20 0930  NA 133* 132* 133*  K 4.5 4.5 4.6  CL 92* 93* 93*  CO2 29 30 32  GLUCOSE 108* 111* 114*  BUN 36* 31* 36*  CREATININE 1.11 0.95 0.95  CALCIUM 8.5* 8.2* 8.5*  MG 2.0 2.0 2.2   GFR: Estimated Creatinine Clearance: 86.2 mL/min (by C-G formula based on SCr of 0.95 mg/dL). Liver Function Tests: No results for input(s): AST, ALT, ALKPHOS, BILITOT, PROT, ALBUMIN in the last 168 hours. No results for input(s): LIPASE, AMYLASE in the last 168 hours. Recent Labs  Lab 06/12/20 1501  AMMONIA 27   Coagulation Profile: No results for input(s): INR, PROTIME in the last 168 hours. Cardiac Enzymes: No results for input(s): CKTOTAL, CKMB, CKMBINDEX, TROPONINI in the last 168 hours. BNP (last 3 results) No results for input(s): PROBNP in the last 8760 hours. HbA1C: No results for input(s): HGBA1C in the last 72  hours. CBG: No results for input(s): GLUCAP in the last 168 hours. Lipid Profile: No results for input(s): CHOL, HDL, LDLCALC, TRIG, CHOLHDL, LDLDIRECT in the last 72 hours. Thyroid Function Tests: No results for input(s): TSH, T4TOTAL, FREET4, T3FREE, THYROIDAB in the last 72  hours. Anemia Panel: No results for input(s): VITAMINB12, FOLATE, FERRITIN, TIBC, IRON, RETICCTPCT in the last 72 hours. Sepsis Labs: No results for input(s): PROCALCITON, LATICACIDVEN in the last 168 hours.  Recent Results (from the past 240 hour(s))  Body fluid culture     Status: None   Collection Time: 06/12/20  6:29 PM   Specimen: Peritoneal Washings; Body Fluid  Result Value Ref Range Status   Specimen Description   Final    PERITONEAL Performed at [facility], [address] [place], [place] [address]    Special Requests   Final    NONE Performed at [facility], [address] [place], [place] [address]    Gram Stain   Final    MODERATE WBC PRESENT,BOTH PMN AND MONONUCLEAR NO ORGANISMS SEEN    Culture   Final    NO GROWTH 3 DAYS Performed at [facility], [place] [address], [place], [place] [address]    Report Status 06/16/2020 FINAL  Final      Imaging Studies   No results found.   Medications   Scheduled Meds: . baclofen  10 mg Oral TID  . ciprofloxacin  500 mg Oral Daily  . feeding supplement (ENSURE ENLIVE)  237 mL Oral TID BM  . folic acid  1 mg Oral Daily  . furosemide  40 mg Oral BID  . lactulose  20 g Oral BID  . loratadine  10 mg Oral Daily  . magnesium oxide  400 mg Oral Daily  . morphine  15 mg Oral Q12H  . multivitamin with minerals  1 tablet Oral Daily  . pantoprazole  40 mg Oral BID  . rifaximin  550 mg Oral BID  . sodium chloride flush  3 mL Intravenous Q12H  . spironolactone  25 mg Oral Daily   Continuous Infusions: . sodium chloride    . sodium chloride Stopped (05/31/2020 2338)       LOS: 23 days    Time spent: 20  minutes    Ezekiel Slocumb, DO [facility]  06/17/2020, 2:15 PM    If 7PM-7AM, please contact night-coverage. How to contact the [facility] Attending or Consulting provider [place] or covering provider during after hours [place], for this patient?    1. Check the care team in [facility] and look for a) attending/consulting TRH provider listed and b) the [facility] team listed 2. Log into [email or web address] and use [place]'s universal password to access. If you do not have the password, please contact the hospital operator. 3. Locate the [facility] provider you are looking for under [facility] and page to a number that you can be directly reached. 4. If you still have difficulty reaching the provider, please page the [facility] (Director on Call) for the Hospitalists listed on amion for assistance.

## 2020-06-18 DIAGNOSIS — C220 Liver cell carcinoma: Secondary | ICD-10-CM | POA: Diagnosis not present

## 2020-06-18 DIAGNOSIS — K7031 Alcoholic cirrhosis of liver with ascites: Secondary | ICD-10-CM | POA: Diagnosis not present

## 2020-06-18 DIAGNOSIS — D696 Thrombocytopenia, unspecified: Secondary | ICD-10-CM | POA: Diagnosis not present

## 2020-06-18 DIAGNOSIS — K652 Spontaneous bacterial peritonitis: Secondary | ICD-10-CM | POA: Diagnosis not present

## 2020-06-18 DIAGNOSIS — L89152 Pressure ulcer of sacral region, stage 2: Secondary | ICD-10-CM

## 2020-06-18 MED ORDER — LACTULOSE 10 GM/15ML PO SOLN
15.0000 g | Freq: Every day | ORAL | Status: DC
  Administered 2020-06-19 – 2020-06-20 (×2): 15 g via ORAL
  Administered 2020-06-21: 10 g via ORAL
  Filled 2020-06-18 (×3): qty 30

## 2020-06-18 MED ORDER — OXYCODONE HCL 5 MG PO TABS
10.0000 mg | ORAL_TABLET | Freq: Four times a day (QID) | ORAL | Status: DC | PRN
  Administered 2020-06-18: 10 mg via ORAL
  Filled 2020-06-18 (×2): qty 2

## 2020-06-18 MED ORDER — DIPHENHYDRAMINE HCL 50 MG/ML IJ SOLN
12.5000 mg | Freq: Four times a day (QID) | INTRAMUSCULAR | Status: DC | PRN
  Administered 2020-06-18: 12.5 mg via INTRAVENOUS
  Filled 2020-06-18: qty 1

## 2020-06-18 NOTE — TOC Progression Note (Addendum)
Transition of Care [facility]) - Progression Note    Patient Details  Name: John [name] MRN: [id number] Date of Birth: 30-Mar-1956  Transition of Care [facility]) CM/SW [place], LCSW Phone Number: 06/18/2020, 10:33 AM  Clinical Narrative: CSW met with patient and his brother John [name] at bedside. Discussed bed offer from [facility]. Patient has received his COVID vaccinations. CSW spoke with Dr. Grayland Ormond and Aleen Sells Chrismon from the [facility] cancer center yesterday about transferring treatment here. Ms. Natale Milch said they have been able to get a grant transferred here in the past. Patient and his brother are unsure who to call. His oncologist at [place] is Erich Montane, MD. Hanley Seamen Ms. Chrismon a phone number that was listed online and associated with Dr. Oralia Rud.    12:53 pm: [facility] in [place] is able to offer patient a bed.  2:25 pm: Discussed bed offers with patient and his brother. They prefer to move forward with transferring his care to the [facility] cancer center. Dr. Grayland Ormond is aware. Ms. Natale Milch is working on transferring the grant and said it may take a few days.  Expected Discharge Plan: [place] Barriers to Discharge: No SNF bed, Insurance Authorization  Expected Discharge Plan and Services Expected Discharge Plan: [place] In-house Referral: Clinical Social Work   Post Acute Care Choice: [place] Living arrangements for the past 2 months: Single Family Home                                       Social Determinants of Health (SDOH) Interventions    Readmission Risk Interventions No flowsheet data found.

## 2020-06-18 NOTE — Progress Notes (Signed)
Physical Therapy Treatment Patient Details Name: John [name] MRN: [id number] DOB: [birth year] Today's Date: 06/18/2020    History of Present Illness 64 y.o. male with medical history significant for hepatocellular carcinoma and cholangiocarcinoma on bevacizumab/atezolizumab complicated by recurrent ascites status post peritoneal catheter which was placed on 09/01, history of chronic alcoholic pancreatitis, hepatitis B, COPD, seizure disorder who presented to the ED via EMS for evaluation of worsening abdominal pain    PT Comments    Pt lying in bed upon arrival to room and, with some encouragement, agreeable to participate in PT. Pt requires increased time to perform all tasks secondary to decreased speed of movements. Pt reported need to void. Performing bed mobility and transfers with supervision, pt stood to void in urinal with SBA/CGA for safety. Pt then ambulated out into hallway and back to bed twice with standing rest break between distances using RW with CGA for safety. Distance totalled 80 feet which is the furthest that pt has ambulated with PT. Pt then reported needing to have a BM to which he did in [facility]. Pt provided pericare and returned to bed. Pt requested to have all 4 bedrails up as he reports that he needs them to pull on. Pt reported feeling depressed by hospitalization and provided emotional support. Clinician asked pt if he would like to speak with hospital Chaplain. Pt deferred at this time however was educated that this service can be provided to him anytime at his request. Despite increase in ambulation distance today, SNF placement continues to appropriate to maximize functional mobility and safety/independence with standing activities prior to returning home alone.    Follow Up Recommendations  SNF;Supervision - Intermittent     Equipment Recommendations  Rolling walker with 5" wheels    Recommendations for Other Services       Precautions / Restrictions  Precautions Precautions: Fall Restrictions Weight Bearing Restrictions: No    Mobility  Bed Mobility Overal bed mobility: Needs Assistance       Supine to sit: Supervision;HOB elevated Sit to supine: Supervision;HOB elevated   General bed mobility comments: required increased time to perform sit <> supine with HOB elevated  Transfers Overall transfer level: Needs assistance Equipment used: Rolling walker (2 wheeled) Transfers: Sit to/from Stand Sit to Stand: Supervision         General transfer comment: SBA provided as pt performed sit <> stand from bed and BSC; pt able to power through BUE/BLE to come into standing  Ambulation/Gait Ambulation/Gait assistance: Min guard Gait Distance (Feet): 80 Feet Assistive device: Rolling walker (2 wheeled) Gait Pattern/deviations: Decreased step length - right;Decreased step length - left Gait velocity: decreased   General Gait Details: pt ambulated 40 feet x 2 with standing rest break between bouts of distance   Stairs             Wheelchair Mobility    Modified Rankin (Stroke Patients Only)       Balance Overall balance assessment: Needs assistance Sitting-balance support: No upper extremity supported;Feet supported Sitting balance-Leahy Scale: Good Sitting balance - Comments: no overt LOB noted when sitting edge of bed   Standing balance support: Bilateral upper extremity supported;During functional activity Standing balance-Leahy Scale: Fair Standing balance comment: CGA for balance                            Cognition Arousal/Alertness: Awake/alert Behavior During Therapy: WFL for tasks assessed/performed Overall Cognitive Status: No family/caregiver present to determine  baseline cognitive functioning                                        Exercises Other Exercises Other Exercises: pt stood to utilize urinal prior to ambulation then reported the need to have a BM; additional  time taken for pt to have BM and required total A for pericare     General Comments        Pertinent Vitals/Pain Pain Assessment: Faces Faces Pain Scale: Hurts little more Pain Location: pt points to epigastric area Pain Descriptors / Indicators: Discomfort Pain Intervention(s): Monitored during session    Home Living                      Prior Function            PT Goals (current goals can now be found in the care plan section) Acute Rehab PT Goals Patient Stated Goal: to not be depressed and feel better from day to day PT Goal Formulation: With patient Time For Goal Achievement: 07/02/20 Potential to Achieve Goals: Fair Progress towards PT goals: Progressing toward goals    Frequency    Min 2X/week      PT Plan Current plan remains appropriate    Co-evaluation              AM-PAC PT "6 Clicks" Mobility   Outcome Measure  Help needed turning from your back to your side while in a flat bed without using bedrails?: None Help needed moving from lying on your back to sitting on the side of a flat bed without using bedrails?: A Little Help needed moving to and from a bed to a chair (including a wheelchair)?: None Help needed standing up from a chair using your arms (e.g., wheelchair or bedside chair)?: None Help needed to walk in hospital room?: A Little Help needed climbing 3-5 steps with a railing? : A Little 6 Click Score: 21    End of Session Equipment Utilized During Treatment: Gait belt Activity Tolerance: Patient limited by fatigue Patient left: in bed;with call bell/phone within reach;with bed alarm set Nurse Communication: Mobility status PT Visit Diagnosis: Unsteadiness on feet (R26.81);Other abnormalities of gait and mobility (R26.89);Muscle weakness (generalized) (M62.81);Pain     Time: 1021-1100 PT Time Calculation (min) (ACUTE ONLY): 39 min  Charges:                        Vale Haven, SPT   Vale Haven 06/18/2020, 12:33  PM

## 2020-06-18 NOTE — Progress Notes (Signed)
Patient ID: John [name], male   DOB: 03-27-1956, 64 y.o.   MRN: [id number] [facility] PROGRESS NOTE  John [name] FFM:[id number] DOB: 08/11/1956 DOA: 05/07/2020 PCP: Cletis Athens, MD  HPI/Subjective: Patient feels okay.  He is very particular on who he lets drain his abdomen.  No nausea or vomiting.  Appetite okay.  He is agreeable to take lactulose 15 g daily only.  Brother called and concerned about his pain medications.  Objective: Vitals:   06/18/20 1329 06/18/20 1612  BP: 106/63 (!) 95/56  Pulse: 90 91  Resp: 16 16  Temp: 98.2 F (36.8 C) 97.8 F (36.6 C)  SpO2: 98% 93%    Intake/Output Summary (Last 24 hours) at 06/18/2020 1701 Last data filed at 06/18/2020 1525 Gross per 24 hour  Intake 240 ml  Output 1850 ml  Net -1610 ml   Filed Weights   05/31/20 0332 06/02/20 0257 06/17/20 1150  Weight: 71.5 kg 71.4 kg 78.3 kg    ROS: Review of Systems  Respiratory: Negative for cough and shortness of breath.   Cardiovascular: Negative for chest pain.  Gastrointestinal: Negative for abdominal pain, nausea and vomiting.   Exam: Physical Exam HENT:     Head: Normocephalic.     Mouth/Throat:     Pharynx: No oropharyngeal exudate.  Eyes:     General: Lids are normal.     Conjunctiva/sclera: Conjunctivae normal.     Pupils: Pupils are equal, round, and reactive to light.  Cardiovascular:     Rate and Rhythm: Normal rate and regular rhythm.     Heart sounds: Normal heart sounds, S1 normal and S2 normal.  Pulmonary:     Breath sounds: No decreased breath sounds, wheezing, rhonchi or rales.  Abdominal:     General: There is distension.     Palpations: Abdomen is soft.     Tenderness: There is no abdominal tenderness.  Musculoskeletal:     Right lower leg: No swelling.     Left lower leg: No swelling.  Skin:    General: Skin is warm.     Findings: No rash.  Neurological:     Mental Status: He is alert and oriented to person, place, and time.        Data Reviewed: Basic Metabolic Panel: Recent Labs  Lab 06/11/20 1817 06/13/20 1721 06/16/20 0930  NA 133* 132* 133*  K 4.5 4.5 4.6  CL 92* 93* 93*  CO2 29 30 32  GLUCOSE 108* 111* 114*  BUN 36* 31* 36*  CREATININE 1.11 0.95 0.95  CALCIUM 8.5* 8.2* 8.5*  MG 2.0 2.0 2.2    Recent Labs  Lab 06/12/20 1501  AMMONIA 27   CBC: Recent Labs  Lab 06/11/20 1817 06/13/20 1108 06/13/20 1721 06/16/20 0930  WBC 12.3* 8.5 6.1 7.1  NEUTROABS  --  6.8  --   --   HGB 12.8* 12.8* 11.7* 11.9*  HCT 38.3* 40.3 36.3* 36.8*  MCV 94.8 97.1 97.1 98.7  PLT 18* 23* 18* 24*     Recent Results (from the past 240 hour(s))  Body fluid culture     Status: None   Collection Time: 06/12/20  6:29 PM   Specimen: Peritoneal Washings; Body Fluid  Result Value Ref Range Status   Specimen Description   Final    PERITONEAL Performed at [facility], [address], [place], [place] [address]    Special Requests   Final    NONE Performed at [facility], [address] [place], [place] [address]    Gram Stain   Final    MODERATE WBC PRESENT,BOTH PMN AND MONONUCLEAR NO ORGANISMS SEEN    Culture   Final    NO GROWTH 3 DAYS Performed at [facility] [address] [place], [place] [address]    Report Status 06/16/2020 FINAL  Final      Scheduled Meds:  baclofen  10 mg Oral TID   ciprofloxacin  500 mg Oral Daily   feeding supplement  237 mL Oral TID BM   folic acid  1 mg Oral Daily   furosemide  40 mg Oral BID   lactulose  15 g Oral Daily   loratadine  10 mg Oral Daily   magnesium oxide  400 mg Oral Daily   multivitamin with minerals  1 tablet Oral Daily   pantoprazole  40 mg Oral BID   rifaximin  550 mg Oral BID   sodium chloride flush  3 mL Intravenous Q12H   spironolactone  25 mg Oral Daily   Continuous Infusions:  sodium chloride     sodium chloride Stopped (05/08/2020 2338)    Assessment/Plan:  1. Liver cirrhosis with  hepatic encephalopathy.  Patient only agreeable to take lactulose 15 g daily.  Continue Xifaxan.  Check ammonia level tomorrow morning. 2. Myoclonic jerks which are intermittent. 3. Recurrent ascites status post peritoneal catheter which was placed 05/07/2020.  The patient is very particular on who he lets drain it.  Asked nursing staff to drain today. 4. SBP with strep mitis.  Completed 5-day course of IV Rocephin now on Cipro for prophylaxis. 5. Thrombocytopenia.  Patient takes Doptelet as outpatient to elevate his platelets.  Unfortunately we do not have this medication here. 6. Hepatocellular carcinoma and cholangiocarcinoma followed at [facility]  Dr. Grayland Ormond to follow-up as outpatient now. 7. GERD on PPI 8. Stage II sacral ulcer.  Please see description below.  Pressure Injury 05/29/20 Sacrum Stage 2 -  Partial thickness loss of dermis presenting as a shallow open injury with a red, pink wound bed without slough. (Active)  05/29/20 1024  Location: Sacrum  Location Orientation:   Staging: Stage 2 -  Partial thickness loss of dermis presenting as a shallow open injury with a red, pink wound bed without slough.  Wound Description (Comments):   Present on Admission:        Code Status:     Code Status Orders  (From admission, onward)         Start     Ordered   05/07/2020 0935  Full code  Continuous        05/21/2020 0936        Code Status History    This patient has a current code status but no historical code status.   Advance Care Planning Activity    Advance Directive Documentation     Most Recent Value  Type of Advance Directive Living will  Pre-existing out of facility DNR order (yellow form or pink MOST form) --  "MOST" Form in Place? --     Family Communication: Spoke with brother on the phone Disposition Plan: Status is: Inpatient  Dispo:  Patient From: Home  Planned Disposition: [place]  Expected discharge date: Awaiting on whether we can get  medication approved as outpatient.  Medically stable for discharge: Once we get approval to go out to rehab we will get him out to the facility.  Consultants:  oncology  Time spent: 33  minutes  Knoah Nedeau [employer]

## 2020-06-19 ENCOUNTER — Other Ambulatory Visit: Payer: Self-pay | Admitting: Pharmacist

## 2020-06-19 DIAGNOSIS — K7200 Acute and subacute hepatic failure without coma: Secondary | ICD-10-CM

## 2020-06-19 DIAGNOSIS — K652 Spontaneous bacterial peritonitis: Secondary | ICD-10-CM | POA: Diagnosis not present

## 2020-06-19 DIAGNOSIS — D696 Thrombocytopenia, unspecified: Secondary | ICD-10-CM

## 2020-06-19 DIAGNOSIS — R188 Other ascites: Secondary | ICD-10-CM

## 2020-06-19 DIAGNOSIS — K7682 Hepatic encephalopathy: Secondary | ICD-10-CM

## 2020-06-19 DIAGNOSIS — K7460 Unspecified cirrhosis of liver: Secondary | ICD-10-CM

## 2020-06-19 LAB — CBC
HCT: 36.3 % — ABNORMAL LOW (ref 39.0–52.0)
Hemoglobin: 11.7 g/dL — ABNORMAL LOW (ref 13.0–17.0)
MCH: 31.4 pg (ref 26.0–34.0)
MCHC: 32.2 g/dL (ref 30.0–36.0)
MCV: 97.3 fL (ref 80.0–100.0)
Platelets: 20 10*3/uL — CL (ref 150–400)
RBC: 3.73 MIL/uL — ABNORMAL LOW (ref 4.22–5.81)
RDW: 19.9 % — ABNORMAL HIGH (ref 11.5–15.5)
WBC: 6.6 10*3/uL (ref 4.0–10.5)
nRBC: 0 % (ref 0.0–0.2)

## 2020-06-19 LAB — COMPREHENSIVE METABOLIC PANEL
ALT: 71 U/L — ABNORMAL HIGH (ref 0–44)
AST: 164 U/L — ABNORMAL HIGH (ref 15–41)
Albumin: 2.8 g/dL — ABNORMAL LOW (ref 3.5–5.0)
Alkaline Phosphatase: 169 U/L — ABNORMAL HIGH (ref 38–126)
Anion gap: 10 (ref 5–15)
BUN: 28 mg/dL — ABNORMAL HIGH (ref 8–23)
CO2: 29 mmol/L (ref 22–32)
Calcium: 8.1 mg/dL — ABNORMAL LOW (ref 8.9–10.3)
Chloride: 93 mmol/L — ABNORMAL LOW (ref 98–111)
Creatinine, Ser: 0.98 mg/dL (ref 0.61–1.24)
GFR, Estimated: >60 mL/min (ref >60)
Glucose, Bld: 114 mg/dL — ABNORMAL HIGH (ref 70–99)
Potassium: 4.8 mmol/L (ref 3.5–5.1)
Sodium: 132 mmol/L — ABNORMAL LOW (ref 135–145)
Total Bilirubin: 2.8 mg/dL — ABNORMAL HIGH (ref 0.3–1.2)
Total Protein: 6 g/dL — ABNORMAL LOW (ref 6.5–8.1)

## 2020-06-19 LAB — MAGNESIUM: Magnesium: 2.1 mg/dL (ref 1.7–2.4)

## 2020-06-19 LAB — AMMONIA: Ammonia: 49 umol/L — ABNORMAL HIGH (ref 9–35)

## 2020-06-19 MED ORDER — DOPTELET 20 MG PO TABS: 40.0000 mg | ORAL_TABLET | Freq: Every day | ORAL | 0 refills | Status: AC

## 2020-06-19 MED ORDER — PHENYLEPHRINE IN HARD FAT 0.25 % RE SUPP
1.0000 | Freq: Two times a day (BID) | RECTAL | Status: DC
  Administered 2020-06-21: 1 via RECTAL
  Filled 2020-06-19 (×8): qty 1

## 2020-06-19 MED ORDER — COLLAGENASE 250 UNIT/GM EX OINT
TOPICAL_OINTMENT | Freq: Every day | CUTANEOUS | Status: DC
  Filled 2020-06-19: qty 30

## 2020-06-19 NOTE — Progress Notes (Addendum)
Mobility Specialist - Progress Note   06/19/20 1209  Mobility  Activity Ambulated in room;Ambulated in hall  Level of Assistance Standby assist, set-up cues, supervision of patient - no hands on (CGA for safety)  Assistive Device Front wheel walker  Distance Ambulated (ft) 100 ft  Mobility Response Tolerated well  Mobility performed by Mobility specialist  $Mobility charge 1 Mobility    Pt laying in bed upon arrival. Pt hesitant at first, but eventually agrees to session. Pt ambulated 100' total in room and hallway SBA using a RW. CGA utilized for safety. No LOB noted. No c/o pain or SOB. Overall, pt tolerated session well. Pt requested to use BSC to void. Pt having a BM. NT was notified. Pt left on [facility] w/ call bell placed in reach.     Linell Meldrum Mobility Specialist  06/19/20, 12:11 PM

## 2020-06-19 NOTE — TOC Progression Note (Signed)
Transition of Care [facility]) - Progression Note    Patient Details  Name: John [name] MRN: [id number] Date of Birth: 03/27/1956  Transition of Care [facility]) CM/SW [place], LCSW Phone Number: 06/19/2020, 10:54 AM  Clinical Narrative:  Staff at [facility] center have been in contact with [facility] who stated patient is not getting financial assistance for his chemo. His insurance was covering it. [facility] is working on authorization for treatment there.   Expected Discharge Plan: [place] Barriers to Discharge: No SNF bed, Insurance Authorization  Expected Discharge Plan and Services Expected Discharge Plan: [place] In-house Referral: Clinical Social Work   Post Acute Care Choice: [place] Living arrangements for the past 2 months: Single Family Home                                       Social Determinants of Health (SDOH) Interventions    Readmission Risk Interventions No flowsheet data found.

## 2020-06-19 NOTE — Progress Notes (Signed)
Oral Chemotherapy Pharmacist Encounter  Patient started on Doptelet by [facility]. Patient transferring care to our [place]. MD plans to continue Doptelet for now. No DDI identified with Doptelet. Prescription sent to [facility].  Test claim copay: $0  Will coordinate for patient to receive medication once discharged from hospital.   Darl Pikes, PharmD, BCPS, BCOP, CPP Hematology/Oncology Clinical Pharmacist [facility]/HP/AP [facility] [phone]  06/19/2020 1:52 PM

## 2020-06-19 NOTE — Progress Notes (Signed)
Patient ID: John [name], male   DOB: April 20, 1956, 64 y.o.   MRN: [id number] [facility] PROGRESS NOTE  DORRIAN DOGGETT [id number] DOB: 1956-08-02 DOA: 05/29/2020 PCP: Cletis Athens, MD  HPI/Subjective: Patient stated he had a lot of diarrhea with the one dose of lactulose.  Patient deferred labs this morning.  Patient stated he let the nursing staff remove 1 L of fluid from his abdomen yesterday.  Abdomen feels a little distended today but does not want them to draw off any more fluid.  Objective: Vitals:   06/19/20 0838 06/19/20 1245  BP: 104/61 120/71  Pulse: 87 97  Resp:  18  Temp:  98.2 F (36.8 C)  SpO2: 93% 96%    Intake/Output Summary (Last 24 hours) at 06/19/2020 1412 Last data filed at 06/19/2020 0500 Gross per 24 hour  Intake 240 ml  Output 1340 ml  Net -1100 ml   Filed Weights   05/31/20 0332 06/02/20 0257 06/17/20 1150  Weight: 71.5 kg 71.4 kg 78.3 kg    ROS: Review of Systems  Respiratory: Negative for shortness of breath.   Cardiovascular: Negative for chest pain.  Gastrointestinal: Positive for abdominal pain and diarrhea. Negative for nausea and vomiting.   Exam: Physical Exam HENT:     Head: Normocephalic.     Mouth/Throat:     Pharynx: No oropharyngeal exudate.  Eyes:     General: Lids are normal.     Conjunctiva/sclera: Conjunctivae normal.     Pupils: Pupils are equal, round, and reactive to light.  Cardiovascular:     Rate and Rhythm: Normal rate and regular rhythm.     Heart sounds: Normal heart sounds, S1 normal and S2 normal.  Pulmonary:     Breath sounds: No decreased breath sounds, wheezing, rhonchi or rales.  Abdominal:     General: There is distension.     Palpations: Abdomen is soft.     Tenderness: There is no abdominal tenderness.  Musculoskeletal:     Right lower leg: No swelling.     Left lower leg: No swelling.  Skin:    General: Skin is warm.     Findings: No rash.  Neurological:     Mental Status: He is  alert and oriented to person, place, and time.       Data Reviewed: Basic Metabolic Panel: Recent Labs  Lab 06/13/20 1721 06/16/20 0930 06/19/20 1151  NA 132* 133* 132*  K 4.5 4.6 4.8  CL 93* 93* 93*  CO2 30 32 29  GLUCOSE 111* 114* 114*  BUN 31* 36* 28*  CREATININE 0.95 0.95 0.98  CALCIUM 8.2* 8.5* 8.1*  MG 2.0 2.2 2.1   Liver Function Tests: Recent Labs  Lab 06/19/20 1151  AST 164*  ALT 71*  ALKPHOS 169*  BILITOT 2.8*  PROT 6.0*  ALBUMIN 2.8*    Recent Labs  Lab 06/12/20 1501 06/19/20 1151  AMMONIA 27 49*   CBC: Recent Labs  Lab 06/13/20 1108 06/13/20 1721 06/16/20 0930 06/19/20 1151  WBC 8.5 6.1 7.1 6.6  NEUTROABS 6.8  --   --   --   HGB 12.8* 11.7* 11.9* 11.7*  HCT 40.3 36.3* 36.8* 36.3*  MCV 97.1 97.1 98.7 97.3  PLT 23* 18* 24* 20*     Recent Results (from the past 240 hour(s))  Body fluid culture     Status: None   Collection Time: 06/12/20  6:29 PM   Specimen: Peritoneal Washings; Body Fluid  Result Value Ref Range Status  Specimen Description   Final    PERITONEAL Performed at [facility], [address] [place], [place] [address]    Special Requests   Final    NONE Performed at [facility], [place], [place] [address]    Gram Stain   Final    MODERATE WBC PRESENT,BOTH PMN AND MONONUCLEAR NO ORGANISMS SEEN    Culture   Final    NO GROWTH 3 DAYS Performed at [facility], [place] [address], [place], [place] [address]    Report Status 06/16/2020 FINAL  Final      Scheduled Meds: . baclofen  10 mg Oral TID  . ciprofloxacin  500 mg Oral Daily  . feeding supplement  237 mL Oral TID BM  . folic acid  1 mg Oral Daily  . furosemide  40 mg Oral BID  . lactulose  15 g Oral Daily  . loratadine  10 mg Oral Daily  . magnesium oxide  400 mg Oral Daily  . multivitamin with minerals  1 tablet Oral Daily  . pantoprazole  40 mg Oral BID  . rifaximin  550 mg Oral BID  . sodium chloride flush   3 mL Intravenous Q12H  . spironolactone  25 mg Oral Daily   Continuous Infusions: . sodium chloride    . sodium chloride Stopped (05/24/2020 2338)    Assessment/Plan:  1. Liver cirrhosis with hepatic encephalopathy.  Ammonia level today little bit higher 49.  Patient agreeable to taking lactulose daily.  He stated he had diarrhea today. 2. Recurrent ascites with peritoneal catheter.  Had fluid drained off yesterday. 3. SPT with strep mitis.  Completed 5-day course of IV Rocephin now on Cipro prophylaxis. 4. Thrombocytopenia.  Platelet count today 20,000.  We are trying to figure out how to get this patient's medication Doptelet.  We do not have this medication here.  Family states that they do not have it to bring in. 5. Hepatocellular carcinoma, cholangiocarcinoma.  Dr. Grayland Ormond to take over care. 6. GERD on PPI 7. Stage II sacral ulcer.  See description below  Pressure Injury 05/29/20 Sacrum Stage 2 -  Partial thickness loss of dermis presenting as a shallow open injury with a red, pink wound bed without slough. (Active)  05/29/20 1024  Location: Sacrum  Location Orientation:   Staging: Stage 2 -  Partial thickness loss of dermis presenting as a shallow open injury with a red, pink wound bed without slough.  Wound Description (Comments):   Present on Admission:        Code Status:     Code Status Orders  (From admission, onward)         Start     Ordered   05/20/2020 0935  Full code  Continuous        05/22/2020 0936        Code Status History    This patient has a current code status but no historical code status.   Advance Care Planning Activity    Advance Directive Documentation     Most Recent Value  Type of Advance Directive Living will  Pre-existing out of facility DNR order (yellow form or pink MOST form) --  "MOST" Form in Place? --     Family Communication: Spoke with brother yesterday Disposition Plan: Status is: Inpatient  Dispo:  Patient From:  Home  Planned Disposition: [place]  Expected discharge date: Transitional care team working on things with regards to this  medication that he needs.  Medically stable for discharge: Once able to get his medication that he needs will get out to rehab.  Time spent: 27 minutes  [place]

## 2020-06-20 ENCOUNTER — Other Ambulatory Visit: Payer: Self-pay | Admitting: Oncology

## 2020-06-20 DIAGNOSIS — K7031 Alcoholic cirrhosis of liver with ascites: Secondary | ICD-10-CM | POA: Diagnosis not present

## 2020-06-20 DIAGNOSIS — D696 Thrombocytopenia, unspecified: Secondary | ICD-10-CM | POA: Diagnosis not present

## 2020-06-20 DIAGNOSIS — K7200 Acute and subacute hepatic failure without coma: Secondary | ICD-10-CM | POA: Diagnosis not present

## 2020-06-20 DIAGNOSIS — K652 Spontaneous bacterial peritonitis: Secondary | ICD-10-CM | POA: Diagnosis not present

## 2020-06-20 MED ORDER — HYDROCORTISONE (PERIANAL) 2.5 % EX CREA
TOPICAL_CREAM | Freq: Three times a day (TID) | CUTANEOUS | Status: DC
  Filled 2020-06-20: qty 28.35

## 2020-06-20 MED ORDER — AVATROMBOPAG MALEATE 20 MG PO TABS: 40.0000 mg | ORAL_TABLET | Freq: Every day | ORAL | Status: DC

## 2020-06-20 NOTE — Progress Notes (Signed)
PT Cancellation Note  Patient Details Name: John [name] MRN: [id number] DOB: [birth year]   Cancelled Treatment:    Reason Eval/Treat Not Completed: Other (comment) Upon arrival to room, pt asleep and awoke with verbal stimuli. Pt stated that he had a busy morning and is requesting to sleep at this time. Pt fell asleep mid sentence. Will attempt another date/time when pt awake and able to fully participate.   [place] 06/20/2020, 10:34 AM

## 2020-06-20 NOTE — Care Management Important Message (Signed)
Important Message  Patient Details  Name: John [name] MRN: [id number] Date of Birth: 09/03/1956   Medicare Important Message Given:  Yes     Dannette Barbara 06/20/2020, 11:22 AM

## 2020-06-20 NOTE — Progress Notes (Signed)
Mobility Specialist - Progress Note   06/20/20 1215  Mobility  Activity Refused mobility  Mobility performed by Mobility specialist   Pt politely declines mobility this date. States "I'm not feeling good". Will re-attempt at a later date/time.   Marvine Encalade Mobility Specialist  06/20/20, 12:15 PM

## 2020-06-20 NOTE — TOC Progression Note (Addendum)
Transition of Care [facility]) - Progression Note    Patient Details  Name: John [name] MRN: [id number] Date of Birth: 1956-06-18  Transition of Care [facility]) CM/SW [place], LCSW Phone Number: 06/20/2020, 9:38 AM  Clinical Narrative:   Received message from patient's brother last night stating his nephew had found his Doptelet at home and will have it brought in today. Asked [facility] if they would be able to go ahead and accept him but they want confirmation patient will definitely be able to switch treatment to [facility]. Sent secure chat to team working on this to confirm.  1:23 pm: [place] will start authorization to switch centers today. Could get automatic approval or it may take 2-3 business days. CSW uploaded clinicals to [facility] portal to start insurance authorization for [facility].  2:30 pm: Insurance authorization approved for [facility]: [id number]. Good through 10/19. Dr. Grayland Ormond has entered patient's treatment plan so authorization can be started to switch cancer centers.  3:54 pm: Authorization to transfer is under review. Cancer center has to send over some clinicals and will follow up on status on Monday. CSW called to update patient's brother. He said nephew's wife is still planning on bringing Doptelet this afternoon.  Expected Discharge Plan: [place] Barriers to Discharge: No SNF bed, Insurance Authorization  Expected Discharge Plan and Services Expected Discharge Plan: [place] In-house Referral: Clinical Social Work   Post Acute Care Choice: [place] Living arrangements for the past 2 months: Single Family Home                                       Social Determinants of Health (SDOH) Interventions    Readmission Risk Interventions No flowsheet data found.

## 2020-06-20 NOTE — Progress Notes (Signed)
Patient ID: John [name], male   DOB: Sep 17, 1955, 64 y.o.   MRN: [id number] [facility] PROGRESS NOTE  ZADRIAN MCCAULEY [id number] DOB: 10-10-1955 DOA: 05/21/2020 PCP: Cletis Athens, MD  HPI/Subjective: Patient states that he did have some hemorrhoidal bleeding last night but stopped.  Status he is worn out with all the bowel movements he had yesterday.  Otherwise doing okay.  Appetite okay. Came in initially with abdominal pain.  Objective: Vitals:   06/20/20 0825 06/20/20 1240  BP: 111/60 (!) 109/59  Pulse: 91 96  Resp: 14 20  Temp: 98 F (36.7 C) (!) 97.5 F (36.4 C)  SpO2: 96% 95%    Filed Weights   05/31/20 0332 06/02/20 0257 06/17/20 1150  Weight: 71.5 kg 71.4 kg 78.3 kg    ROS: Review of Systems  Respiratory: Negative for shortness of breath.   Cardiovascular: Negative for chest pain.  Gastrointestinal: Positive for diarrhea. Negative for abdominal pain, nausea and vomiting.   Exam: Physical Exam HENT:     Head: Normocephalic.     Mouth/Throat:     Pharynx: No oropharyngeal exudate.  Eyes:     General: Lids are normal.     Conjunctiva/sclera: Conjunctivae normal.     Pupils: Pupils are equal, round, and reactive to light.  Cardiovascular:     Rate and Rhythm: Normal rate and regular rhythm.     Heart sounds: Normal heart sounds, S1 normal and S2 normal.  Pulmonary:     Breath sounds: No decreased breath sounds, wheezing, rhonchi or rales.  Abdominal:     General: There is distension.     Palpations: Abdomen is soft.     Tenderness: There is no abdominal tenderness.  Musculoskeletal:     Right lower leg: No swelling.     Left lower leg: No swelling.  Skin:    General: Skin is warm.     Findings: No rash.  Neurological:     Mental Status: He is alert and oriented to person, place, and time.       Data Reviewed: Basic Metabolic Panel: Recent Labs  Lab 06/13/20 1721 06/16/20 0930 06/19/20 1151  NA 132* 133* 132*  K 4.5 4.6 4.8  CL  93* 93* 93*  CO2 30 32 29  GLUCOSE 111* 114* 114*  BUN 31* 36* 28*  CREATININE 0.95 0.95 0.98  CALCIUM 8.2* 8.5* 8.1*  MG 2.0 2.2 2.1   Liver Function Tests: Recent Labs  Lab 06/19/20 1151  AST 164*  ALT 71*  ALKPHOS 169*  BILITOT 2.8*  PROT 6.0*  ALBUMIN 2.8*   Recent Labs  Lab 06/19/20 1151  AMMONIA 49*   CBC: Recent Labs  Lab 06/13/20 1721 06/16/20 0930 06/19/20 1151  WBC 6.1 7.1 6.6  HGB 11.7* 11.9* 11.7*  HCT 36.3* 36.8* 36.3*  MCV 97.1 98.7 97.3  PLT 18* 24* 20*     Recent Results (from the past 240 hour(s))  Body fluid culture     Status: None   Collection Time: 06/12/20  6:29 PM   Specimen: Peritoneal Washings; Body Fluid  Result Value Ref Range Status   Specimen Description   Final    PERITONEAL Performed at [facility], [address] [place], [place] [address]    Special Requests   Final    NONE Performed at [facility], [address] [place], [place] [address]    Gram Stain   Final    MODERATE WBC PRESENT,BOTH PMN AND MONONUCLEAR NO ORGANISMS  SEEN    Culture   Final    NO GROWTH 3 DAYS Performed at [facility], [place] [address] [place], [place] [address]    Report Status 06/16/2020 FINAL  Final     Scheduled Meds: . Avatrombopag Maleate  40 mg Oral Daily  . baclofen  10 mg Oral TID  . ciprofloxacin  500 mg Oral Daily  . collagenase   Topical Daily  . feeding supplement  237 mL Oral TID BM  . folic acid  1 mg Oral Daily  . furosemide  40 mg Oral BID  . hydrocortisone   Rectal TID  . lactulose  15 g Oral Daily  . loratadine  10 mg Oral Daily  . magnesium oxide  400 mg Oral Daily  . multivitamin with minerals  1 tablet Oral Daily  . pantoprazole  40 mg Oral BID  . phenylephrine  1 suppository Rectal BID  . rifaximin  550 mg Oral BID  . sodium chloride flush  3 mL Intravenous Q12H  . spironolactone  25 mg Oral Daily   Continuous Infusions: . sodium chloride    . sodium chloride Stopped (05/20/2020  2338)    Assessment/Plan:  1. Liver cirrhosis with ascites and hepatic encephalopathy.  Ammonia level yesterday up at 49.  Patient states he had a lot of bowel movements yesterday so it is likely less.  Mental status okay so we will continue to monitor on the same dose of lactulose. 2. Recurrent ascites with peritoneal catheter.  Continue intermittent drainage. 3. SBP with strep mitis.  Already completed treatment with Rocephin and on Cipro prophylaxis. 4. 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  Platelet count 20,000.  Brother located his medication Doptelet.  He will bring it in and we will start giving it. 5. Hepatocellular carcinoma, cholangiocarcinoma.  Dr. Grayland Ormond applied to take over care.  Awaiting for this result prior to getting out to rehab. 6. GERD on PPI 7. Stage II sacral ulcer.  See description below  Pressure Injury 05/29/20 Sacrum Stage 2 -  Partial thickness loss of dermis presenting as a shallow open injury with a red, pink wound bed without slough. (Active)  05/29/20 1024  Location: Sacrum  Location Orientation:   Staging: Stage 2 -  Partial thickness loss of dermis presenting as a shallow open injury with a red, pink wound bed without slough.  Wound Description (Comments):   Present on Admission:        Code Status:     Code Status Orders  (From admission, onward)         Start     Ordered   05/19/2020 0935  Full code  Continuous        05/08/2020 0936        Code Status History    This patient has a current code status but no historical code status.   Advance Care Planning Activity    Advance Directive Documentation     Most Recent Value  Type of Advance Directive Living will  Pre-existing out of facility DNR order (yellow form or pink MOST form) --  "MOST" Form in Place? --     Disposition Plan: Status is: Inpatient  Dispo:  Patient From: Home  Planned Disposition: [place]  Expected discharge date: 06/23/20,  transitional care team to tell me once everything is set to go to rehab.  Medically stable for discharge: Once able to get everything set I will discharge him.  Consultants:  Oncology  Time spent: 26 minutes  [place]

## 2020-06-21 DIAGNOSIS — K7460 Unspecified cirrhosis of liver: Secondary | ICD-10-CM | POA: Diagnosis not present

## 2020-06-21 DIAGNOSIS — K7200 Acute and subacute hepatic failure without coma: Secondary | ICD-10-CM | POA: Diagnosis not present

## 2020-06-21 DIAGNOSIS — C221 Intrahepatic bile duct carcinoma: Secondary | ICD-10-CM | POA: Diagnosis not present

## 2020-06-21 DIAGNOSIS — C220 Liver cell carcinoma: Secondary | ICD-10-CM | POA: Diagnosis not present

## 2020-06-21 LAB — GLUCOSE, CAPILLARY: Glucose-Capillary: 78 mg/dL (ref 70–99)

## 2020-06-22 IMAGING — MR MR ABDOMEN WO/W CM
16 of 17 series · 45 of 48 positions shown · IV contrast (gadavist)
Comparison: 03/28/2019

CLINICAL DATA: Poorly differentiated adenocarcinoma in liver,
probable cholangiocarcinoma

EXAM:
MRI ABDOMEN WITHOUT AND WITH CONTRAST
TECHNIQUE: Multiplanar multisequence MR imaging of the abdomen was performed
both before and after the administration of intravenous contrast.
CONTRAST:  9mL GADAVIST GADOBUTROL 1 MMOL/ML IV SOLN

[Series 2: T2 · coronal · 6.0mm · 1.19mm/px · 2 of 31 slices shown (1 of 2)]
[im 1/31]
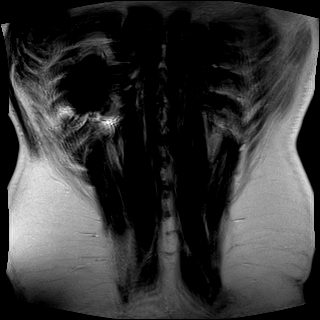
[im 31/31]
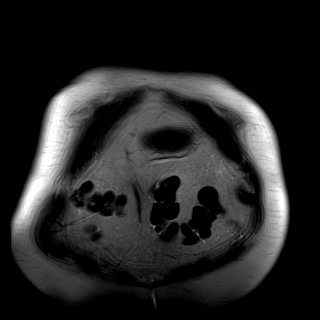

[Series 3: T2 · axial · 6.0mm · 1.19mm/px · z∈[-121,+102]mm · 2 of 32 slices shown (2 of 2)]
[im 1/32]
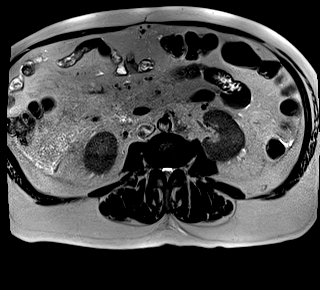
[im 32/32]
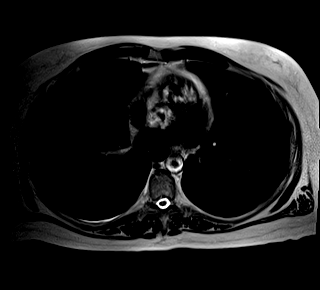

[Series 4: T1 · axial · 6.0mm · 0.74mm/px · z∈[-121,+102]mm · 4 of 64 slices shown]
[im 1/64]
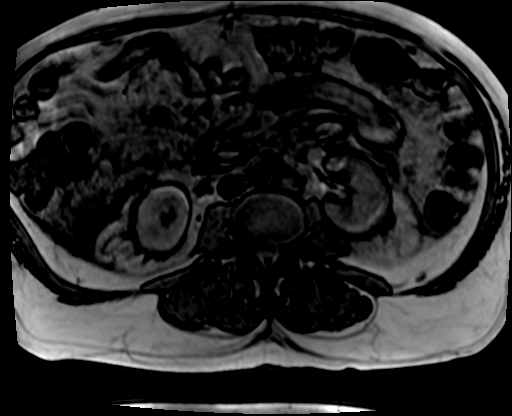
[im 22/64]
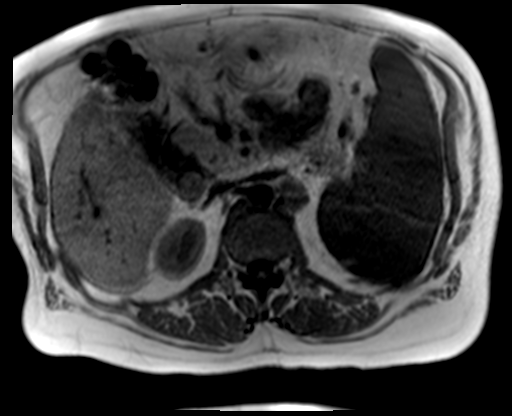
[im 43/64]
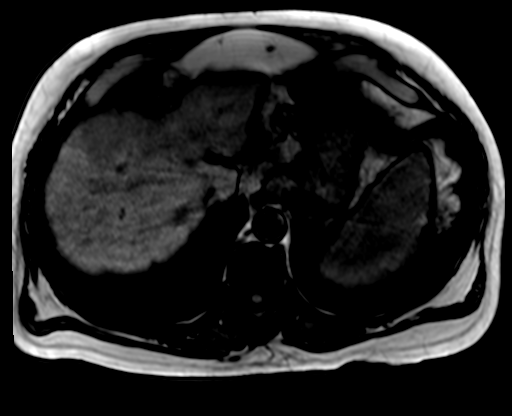
[im 64/64]
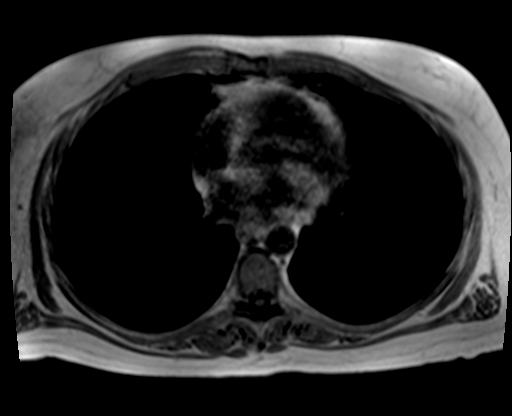

[Series 7: T2 fat-sat · axial · 6.0mm · 1.19mm/px · z∈[-128,+109]mm · 2 of 34 slices shown]
[im 1/34]
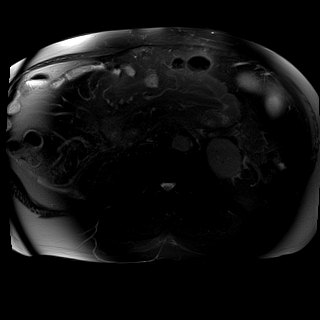
[im 34/34]
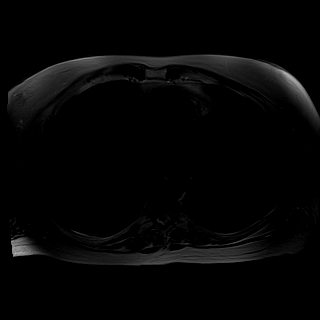

[Series 8: ax dwi_tracew · axial · 6.0mm · 1.42mm/px · z∈[-128,+109]mm · 5 of 102 slices shown]
[im 1/102]
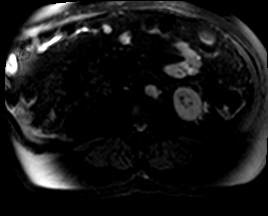
[im 26/102]
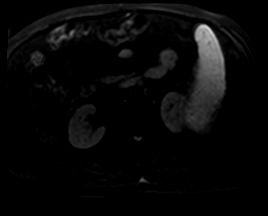
[im 51/102]
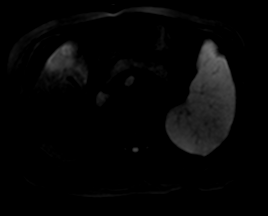
[im 76/102]
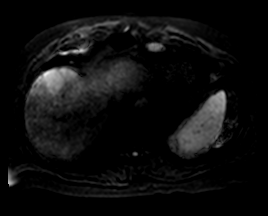
[im 102/102]
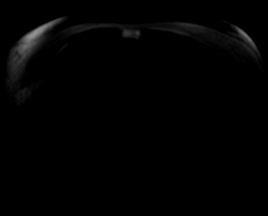

[Series 9: ax dwi_adc · axial · 6.0mm · 1.42mm/px · z∈[-128,+109]mm · 2 of 34 slices shown]
[im 1/34]
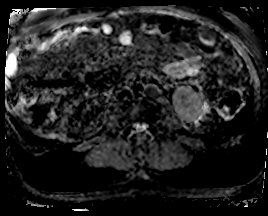
[im 34/34]
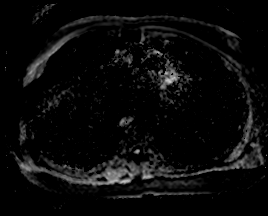

[Series 10: bSSFP · axial · 6.0mm · 0.74mm/px · 1 of 32 slices shown]
[im 1/32]
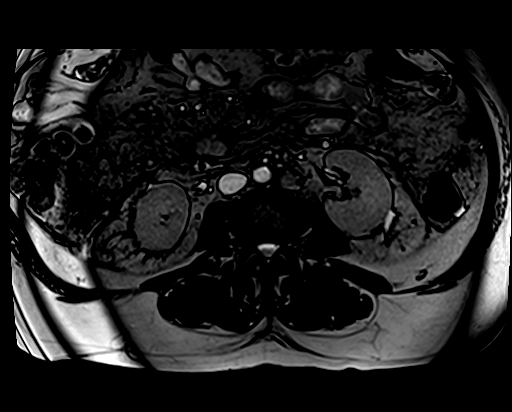

[Series 11: T1 dynamic fat-sat · axial · non-contrast · 3.0mm · 1.19mm/px · z∈[-128,+109]mm · 3 of 80 slices shown (1 of 4)]
[im 1/80]
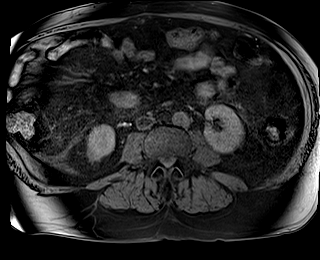
[im 40/80]
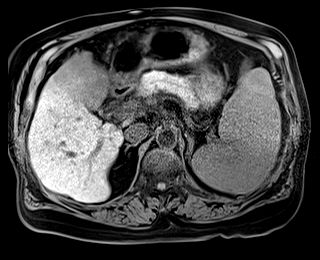
[im 80/80]
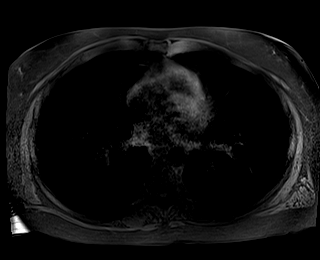

[Series 12: T1 dynamic fat-sat post-contrast · axial · 3.0mm · 1.19mm/px · z∈[-128,+109]mm · 3 of 80 slices shown (1 of 4)]
[im 1/80]
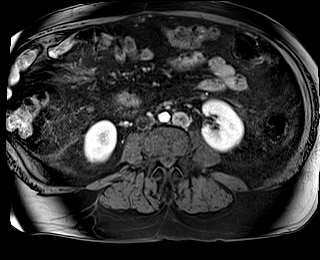
[im 40/80]
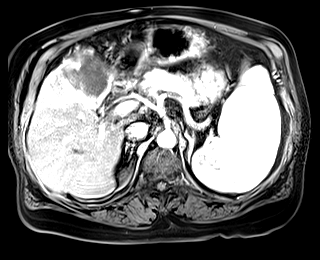
[im 80/80]
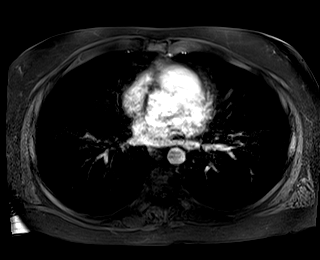

[Series 13: T1 dynamic fat-sat · axial · 3.0mm · 1.19mm/px · z∈[-128,+109]mm · 3 of 80 slices shown (2 of 4)]
[im 1/80]
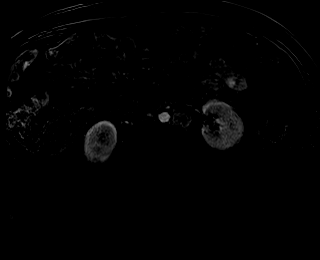
[im 40/80]
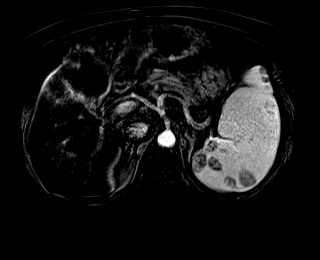
[im 80/80]
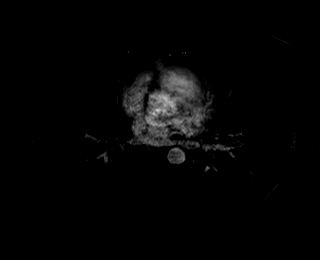

[Series 14: T1 dynamic fat-sat post-contrast · axial · 3.0mm · 1.19mm/px · z∈[-128,+109]mm · 3 of 80 slices shown (2 of 4)]
[im 1/80]
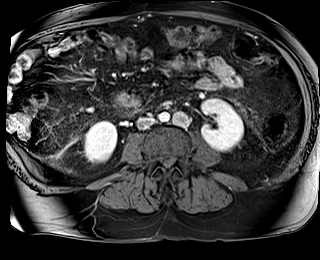
[im 40/80]
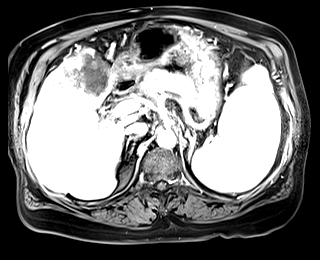
[im 80/80]
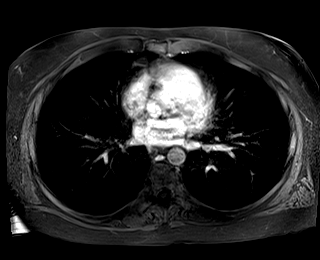

[Series 15: T1 dynamic fat-sat · axial · 3.0mm · 1.19mm/px · z∈[-128,+109]mm · 3 of 80 slices shown (3 of 4)]
[im 1/80]
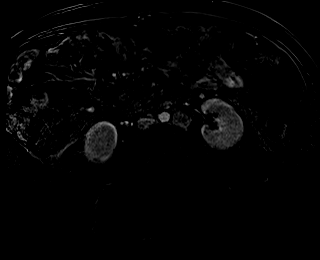
[im 40/80]
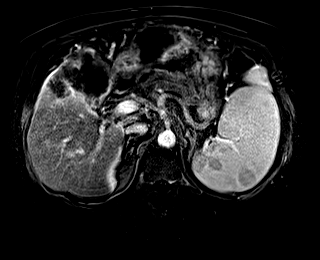
[im 80/80]
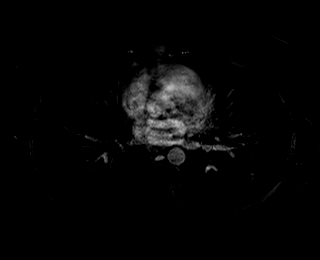

[Series 16: T1 dynamic fat-sat post-contrast · axial · 3.0mm · 1.19mm/px · z∈[-128,+109]mm · 3 of 80 slices shown (3 of 4)]
[im 1/80]
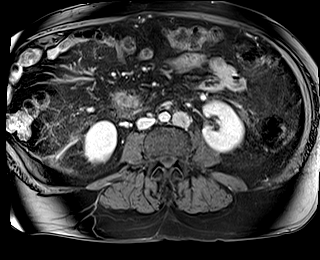
[im 40/80]
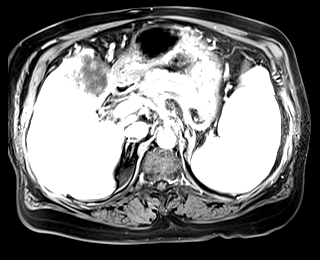
[im 80/80]
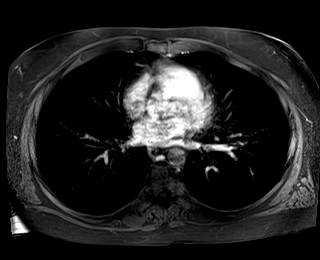

[Series 17: T1 dynamic fat-sat · axial · 3.0mm · 1.19mm/px · z∈[-128,+109]mm · 3 of 80 slices shown (4 of 4)]
[im 1/80]
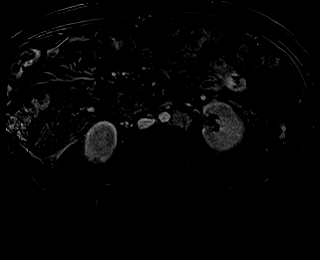
[im 40/80]
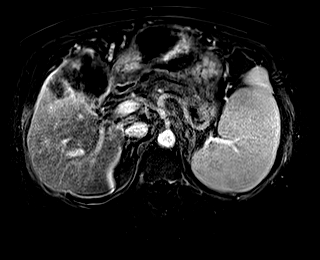
[im 80/80]
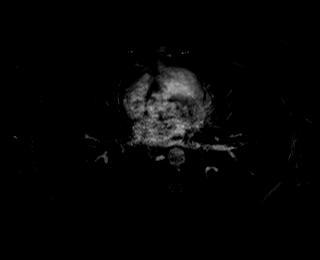

[Series 18: T1 dynamic post-contrast · coronal · 3.0mm · 1.31mm/px · 3 of 72 slices shown]
[im 1/72]
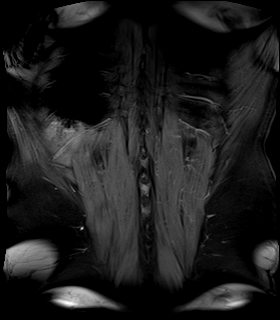
[im 36/72]
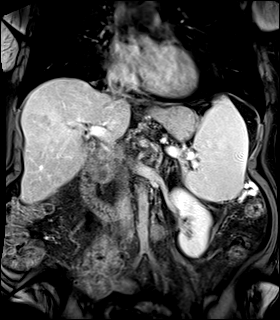
[im 72/72]
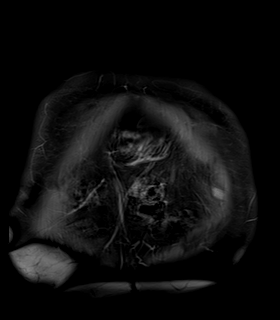

[Series 19: T1 dynamic fat-sat post-contrast · axial · 3.0mm · 1.19mm/px · z∈[-128,+109]mm · 3 of 80 slices shown (4 of 4)]
[im 1/80]
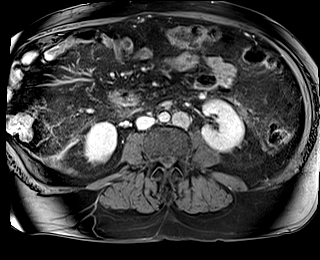
[im 40/80]
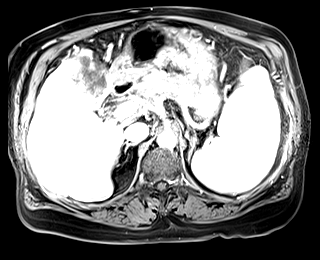
[im 80/80]
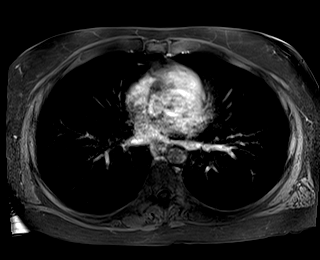

[45 of 48 positions shown; findings below may reference images not displayed]

FINDINGS: Lower chest: Anterior juxta cardiac lymph node continues to
progress. This was previously measured in long axis at 2.0 cm. Long
axis measurement today is 2.9 cm.

Hepatobiliary: The large necrotic liver lesion involvement segment
IV is again identified. This was previously measured on postcontrast
[HOSPITAL] 8.1 x 5.7 x 7.1 cm. Measuring on postcontrast imaging
today, the lesion measures 9.7 x 7.0 cm x 7.9 cm.

Small rim enhancing lesions in the lateral segment left liver are
more conspicuous today, measuring 1.2 and 1.5 cm (images 28 and 30
of 45 second postcontrast series 14). Small cysts again noted in the
liver parenchyma.

Tiny gallstones evident. Gallbladder remains contracted with wall
thickening.

Left portal vein again appears obliterated. Non opacification of the
left and middle hepatic veins is again noted. There is an anterior
nonocclusive filling defect in the supra hepatic IVC extending up to
the junction with the right atrium, similar to prior.

Pancreas: No focal mass lesion. No dilatation of the main duct. No
intraparenchymal cyst. No peripancreatic edema.

Spleen: Heterogeneous enhancement on early postcontrast imaging.
Spleen measures 14.9 cm craniocaudal length consistent with
splenomegaly.

Adrenals/Urinary Tract: No adrenal nodule or mass. Right kidney
unremarkable. Tiny interpolar cyst noted left kidney.

Stomach/Bowel: Stomach is unremarkable. No gastric wall thickening.
No evidence of outlet obstruction. Duodenum is normally positioned
as is the ligament of Treitz. No small bowel or colonic dilatation
within the visualized abdomen.

Vascular/Lymphatic: No abdominal aortic aneurysm. Necrotic celiac
axis node measures 2.9 cm short axis on 37/12 which compares to
cm previously. Necrotic lymph nodes are seen in the hepato duodenal
ligament and a 2.2 cm short axis para duodenal node on 56/12 was
cm when I remeasure in a similar fashion on the prior study. 2.0 cm
short axis left para-aortic node on 53/12 is similar. There is a
lower left para-aortic node on 80/12 measuring 1.7 cm short axis,
but incompletely visualized.

Other:  No intraperitoneal free fluid.

Musculoskeletal: No abnormal marrow enhancement within the
visualized bony anatomy.
IMPRESSION: 1. Continued mild progression of the dominant left hepatic liver
mass.
2. Metastatic lymphadenopathy in the anterior mediastinum, celiac
axis, hepato duodenal ligament, and para-aortic space is similar in
the interval.
3. Thrombosis of the left and middle hepatic veins with nonocclusive
filling defect/thrombus extending into the supra hepatic IVC to a
location near the junction with the right atrium.
4. Non opacification of the left portal vein suggests thrombosis.
5. Splenomegaly.
6. Cholelithiasis.

## 2020-07-07 NOTE — ED Provider Notes (Signed)
[facility]  Department of Emergency Medicine   Code Blue CONSULT NOTE  Chief Complaint: Cardiac arrest/unresponsive   Level V Caveat: Unresponsive  History of present illness: I was contacted by the hospital for a CODE BLUE cardiac arrest upstairs and presented to the patient's bedside.   Patient unresponsive and cyanotic at the time of my arrival with palpable pulse following CPR and ROSC.  ROS: Unable to obtain, Level V caveat  Scheduled Meds: . Avatrombopag Maleate  40 mg Oral Daily  . baclofen  10 mg Oral TID  . ciprofloxacin  500 mg Oral Daily  . collagenase   Topical Daily  . feeding supplement  237 mL Oral TID BM  . folic acid  1 mg Oral Daily  . furosemide  40 mg Oral BID  . hydrocortisone   Rectal TID  . lactulose  15 g Oral Daily  . loratadine  10 mg Oral Daily  . magnesium oxide  400 mg Oral Daily  . multivitamin with minerals  1 tablet Oral Daily  . pantoprazole  40 mg Oral BID  . phenylephrine  1 suppository Rectal BID  . rifaximin  550 mg Oral BID  . sodium chloride flush  3 mL Intravenous Q12H  . spironolactone  25 mg Oral Daily   Continuous Infusions: . sodium chloride    . sodium chloride Stopped (05/16/2020 2338)   PRN Meds:.sodium chloride, sodium chloride, ALPRAZolam, alum & mag hydroxide-simeth, calcium carbonate, dextromethorphan-guaiFENesin, diphenhydrAMINE, magic mouthwash **AND** lidocaine, LORazepam, ondansetron **OR** ondansetron (ZOFRAN) IV, oxyCODONE, sodium chloride flush Past Medical History:  Diagnosis Date  . Abnormal MRI, lumbar spine (2015) 01/21/2016   FINDINGS:  L3-4: Tiny right foraminal and extra foraminal disc bulge adjacent to but not compressing the L3 nerve lateral to the neural foramen. L4-5: There is a small broad-based disc bulge. There is also hypertrophy of the ligamentum flavum and facet joints, left more than right creating moderately severe spinal stenosis and bilateral lateral recess stenosis, left greater  than right. This appe  . Anxiety   . Back ache 05/06/2014  . Cervical pain 05/06/2014  . 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[place]) 06/23/2015  . COPD (chronic obstructive pulmonary disease) ([place])   . De Quervain's tenosynovitis, left 07/07/2017  . Depression   . Dystonia   . Epileptic disorder ([place]) 06/23/2015  . Extremity pain 05/06/2014  . Febrile seizures ([place])    child  . Foot pain 09/11/2014  . Gastric ulcer 06/23/2015  . Gout   . H/O neoplasm 06/23/2015  . Head injury 06/23/2015  . Hemorrhoids   . Hepatic cirrhosis ([place]) 06/23/2015  . Hepatitis B   . History of GI bleed   . Leukopenia   . Liver cancer ([place]) 08/2018  . Neurogenic pain 01/21/2016  . Nodule of upper lobe of left lung 12/02/2017  . Non-small cell cancer of left lung ([place]) 2019   Rad tx's.   . Obstructive sleep apnea 06/23/2015  . 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 Osteoarthritis of spine with radiculopathy, lumbosacral region 06/23/2015  . Peripheral neuropathy 06/23/2015  . Peripheral neuropathy ([place]) 09/73/5329  . Personal history of tobacco use, presenting hazards to health 01/09/2016  . Pulse irregularity   . Seizures ([place])   . Spinal stenosis   . Splenomegaly 05/30/2013  . Thrombocytopenia ([place]) 06/23/2015  . Uncomplicated opioid dependence ([place]) 06/23/2015   Past Surgical History:  Procedure Laterality Date  . ESOPHAGOGASTRODUODENOSCOPY  11/2013  . ESOPHAGOGASTRODUODENOSCOPY (EGD) WITH PROPOFOL N/A  06/23/2018   Procedure: ESOPHAGOGASTRODUODENOSCOPY (EGD) WITH PROPOFOL;  Surgeon: Manya Silvas, MD;  Location: [facility] ENDOSCOPY;  Service: Endoscopy;  Laterality: N/A;  . surgery for cervical neck fracture    . TOOTH EXTRACTION Right 09/28/2016   Social History   Socioeconomic History  . Marital status: Divorced    Spouse name: Not on file  . Number of children: Not on file  . Years of education: Not on file  . Highest education level: Not on file  Occupational  History  . Not on file  Tobacco Use  . Smoking status: Former Smoker    Packs/day: 0.50    Years: 35.00    Pack years: 17.50    Types: Cigarettes    Start date: 10/07/2017    Quit date: 10/07/2017    Years since quitting: 2.7  . Smokeless tobacco: Current User    Types: Snuff  Vaping Use  . Vaping Use: Every day  Substance and Sexual Activity  . Alcohol use: No    Alcohol/week: 0.0 standard drinks    Comment: Pt used to drink alcohol for 30 years, states that he has been alcohol free since July 2013...  . Drug use: No  . Sexual activity: Not on file  Other Topics Concern  . Not on file  Social History Narrative  . Not on file   Social Determinants of Health   Financial Resource Strain:   . Difficulty of Paying Living Expenses: Not on file  Food Insecurity:   . Worried About Charity fundraiser in the Last Year: Not on file  . Ran Out of Food in the Last Year: Not on file  Transportation Needs:   . Lack of Transportation (Medical): Not on file  . Lack of Transportation (Non-Medical): Not on file  Physical Activity:   . Days of Exercise per Week: Not on file  . Minutes of Exercise per Session: Not on file  Stress:   . Feeling of Stress : Not on file  Social Connections:   . Frequency of Communication with Friends and Family: Not on file  . Frequency of Social Gatherings with Friends and Family: Not on file  . Attends Religious Services: Not on file  . Active Member of Clubs or Organizations: Not on file  . Attends Archivist Meetings: Not on file  . Marital Status: Not on file  Intimate Partner Violence:   . Fear of Current or Ex-Partner: Not on file  . Emotionally Abused: Not on file  . Physically Abused: Not on file  . Sexually Abused: Not on file   Allergies  Allergen Reactions  . Codeine Nausea Only  . Morphine Nausea Only  . Duloxetine Rash    Made me sick  . Oxycodone Hives    Last set of Vital Signs (not current) Vitals:   06-29-20 0733  06/29/20 1152  BP: 110/72 (!) 108/59  Pulse: 93 98  Resp: 14 20  Temp: 98.2 F (36.8 C) (!) 97.4 F (36.3 C)  SpO2: 97% 96%      Physical Exam  Gen: unresponsive Cardiovascular: palpable femoral pulse Resp: apneic. Gasping respirations Abd: nondistended  Neuro: GCS 3, unresponsive to pain  HEENT: No blood in posterior pharynx, gag reflex absent  Neck: No crepitus  Musculoskeletal: No deformity  Skin: warm  Procedures  INTUBATION Performed by: Blake Divine Required items: required blood products, implants, devices, and special equipment available Patient identity confirmed: provided demographic data and hospital-assigned identification number Time out: Immediately  prior to procedure a "time out" was called to verify the correct patient, procedure, equipment, support staff and site/side marked as required. Indications: Cardiac arrest Intubation method: Video laryngoscopy Preoxygenation: BVM Sedatives: None Paralytic: None Tube Size: 7.5 cuffed Post-procedure assessment: chest rise and ETCO2 monitor Breath sounds: equal and absent over the epigastrium Tube secured by Respiratory Therapy Patient tolerated the procedure well with no immediate complications.  CRITICAL CARE Performed by: Blake Divine Total critical care time: 15 Critical care time was exclusive of separately billable procedures and treating other patients. Critical care was necessary to treat or prevent imminent or life-threatening deterioration. Critical care was time spent personally by me on the following activities: development of treatment plan with patient and/or surrogate as well as nursing, discussions with consultants, evaluation of patient's response to treatment, examination of patient, obtaining history from patient or surrogate, ordering and performing treatments and interventions, ordering and review of laboratory studies, ordering and review of radiographic studies, pulse oximetry and  re-evaluation of patient's condition.  Cardiopulmonary Resuscitation (CPR) Procedure Note  Directed/Performed by: Blake Divine I personally directed ancillary staff and/or performed CPR in an effort to regain return of spontaneous circulation and to maintain cardiac, neuro and systemic perfusion.    Medical Decision making  63 year old male currently admitted to the hospital reportedly suddenly lost consciousness and fell backwards, found to be in cardiac arrest.  Assessment and Plan  ROSC achieved at the time of my arrival, patient remained unresponsive and cyanotic. BVM used for preoxygenation and patient intubated on the first attempt without difficulty, ET tube visualized passing through vocal cords. Minimal color change noted but patient with equal breath sounds, ET tube positioned was rechecked with laryngoscope and once again visualized through vocal cords. I suspect lack of color change due to poor perfusion at this time.  I was notified by ICU team that ET tube had passed into the esophagus once patient arrived at ICU. Given tube visualized through the cords previously, I suspect position of tube may have shift during transport.    Blake Divine, MD 07/15/2020 [phone]

## 2020-07-07 NOTE — Progress Notes (Addendum)
Patient ID: John [name], male   DOB: 11-10-1955, 64 y.o.   MRN: [id number]  CODE BLUE was called just as I was submitting my note from seeing him earlier.  Patient was up with walker did not feel well and was lie down in the bed and then CODE BLUE was called.  They started CPR and gave 1 unit of epi and regained pulse and rhythm.  The patient was agonal breathing and blue and needed to be bagged and intubated.  ER physician intubated.  Patient had a systolic blood pressure of 108.  Patient transferred to the critical care unit.  Case discussed with Dr. Lanney Gins critical care specialist.  Since the patient is intubated critical care team will take over care.  Updated brother outside the room. Dr Loletha Grayer

## 2020-07-07 NOTE — Progress Notes (Addendum)
Tech called for help, upon entering room patient was slumped back on bed, unresponsive, no pulse, compressions started and code blue called. Dr Leslye Peer at bedside, patient transferred to ICU.

## 2020-07-07 NOTE — Procedures (Signed)
Endotracheal Intubation: Patient required placement of an artificial airway secondary to Respiratory Failure  Consent: Emergent.   Hand washing performed prior to starting the procedure.   Medications administered for sedation prior to procedure:   Patient is unresponsive with asystole.   Patient had oxygen saturation of 0% with adequate normal waveform pleth on SpO2 tracing.  Evaluation of ETT via GlideScope showed that ETT must have moved into esophagus while in transit this was removed and placed into trachea with improvement of oxygen saturation to >90%.  Present during procedure are RT Merrilee Seashore and Lattie Haw as well multiple RNs and [employer].   A time out procedure was called and correct patient, name, & ID confirmed. Needed supplies and equipment were assembled and checked to include ETT, 10 ml syringe, Glidescope, Mac and Miller blades, suction, oxygen and bag mask valve, end tidal CO2 monitor.   Patient was positioned to align the mouth and pharynx to facilitate visualization of the glottis.   Heart rate, SpO2 and blood pressure was continuously monitored during the procedure. Pre-oxygenation was conducted prior to intubation and endotracheal tube was placed through the vocal cords into the trachea.     The artificial airway was placed under direct visualization via glidescope route using a 8 ETT on the first attempt.  ETT was secured at 23 cm mark.  Placement was confirmed by auscuitation of lungs with good breath sounds bilaterally and no stomach sounds.  Condensation was noted on endotracheal tube.   Pulse ox 98%.  CO2 detector in place with appropriate color change.   Complications: None .     Chest radiograph ordered and pending.   Comments: OGT placed via glidescope.    Ottie Glazier, M.D.  [facility]

## 2020-07-07 NOTE — Progress Notes (Signed)
Code Blue called at 1303 responded to room 214 and began bagging patient by ambu bag with 100% oxygen. ER MD arrived and intubated patient. Patient was then transferred to ICU. Patient was reintubated by Dr. Lanney Gins continued to bag patient until 1332 when code was called.

## 2020-07-07 NOTE — Progress Notes (Signed)
Pt arrived to [facility] post Code Blue.  Pt appeared cyanotic, accompanied by RNs and RTs.  Pt intubated, being oxygenated via Bag-valve.  Upon connection to bedside monitor, pt was discovered to be pulseless in PEA.  Code Blue initiated; see Code Blue flowsheet for documentation.

## 2020-07-07 NOTE — Progress Notes (Signed)
OT Cancellation Note  Patient Details Name: John [name] MRN: [id number] DOB: 1955-12-01   Cancelled Treatment:    Reason Eval/Treat Not Completed: Medical issues which prohibited therapy  Code called, will hold therapy at this time. Will f/u at later date/time as able/appropriate.   Gerrianne Scale, [place], OTR/L ascom [phone] 2020/07/17, 1:03 PM

## 2020-07-07 NOTE — Progress Notes (Signed)
Patient ID: John [name], male   DOB: Aug 02, 1956, 64 y.o.   MRN: [id number] [facility] PROGRESS NOTE  John [name] DOB: 10-Jul-1956 DOA: 06/05/2020 PCP: Cletis Athens, MD  HPI/Subjective: Patient states that he had diarrhea all day yesterday.  He took the lactulose 15 g today and had to go to the bathroom.  Patient stated he let the nurse draw off 950 mL of his abdomen today.  He feels okay.  He states his brother brought him 20 boxes of his medication.  Objective: Vitals:   07-02-2020 0733 07-02-2020 1152  BP: 110/72 (!) 108/59  Pulse: 93 98  Resp: 14 20  Temp: 98.2 F (36.8 C) (!) 97.4 F (36.3 C)  SpO2: 97% 96%    Intake/Output Summary (Last 24 hours) at 07-02-20 1253 Last data filed at 2020-07-02 1010 Gross per 24 hour  Intake 480 ml  Output --  Net 480 ml   Filed Weights   05/31/20 0332 06/02/20 0257 06/17/20 1150  Weight: 71.5 kg 71.4 kg 78.3 kg    ROS: Review of Systems  Respiratory: Negative for shortness of breath.   Cardiovascular: Negative for chest pain.  Gastrointestinal: Positive for diarrhea. Negative for abdominal pain, nausea and vomiting.   Exam: Physical Exam HENT:     Head: Normocephalic.     Mouth/Throat:     Pharynx: No oropharyngeal exudate.  Eyes:     General: Lids are normal.     Conjunctiva/sclera: Conjunctivae normal.     Pupils: Pupils are equal, round, and reactive to light.  Cardiovascular:     Rate and Rhythm: Normal rate and regular rhythm.     Heart sounds: Normal heart sounds, S1 normal and S2 normal.  Pulmonary:     Breath sounds: No decreased breath sounds, wheezing, rhonchi or rales.  Abdominal:     Palpations: Abdomen is soft.     Tenderness: There is no abdominal tenderness.  Musculoskeletal:     Right ankle: No swelling.     Left ankle: No swelling.  Skin:    General: Skin is warm.     Findings: No rash.  Neurological:     Mental Status: He is alert and oriented to person, place, and time.        Data Reviewed: Basic Metabolic Panel: Recent Labs  Lab 06/16/20 0930 06/19/20 1151  NA 133* 132*  K 4.6 4.8  CL 93* 93*  CO2 32 29  GLUCOSE 114* 114*  BUN 36* 28*  CREATININE 0.95 0.98  CALCIUM 8.5* 8.1*  MG 2.2 2.1   Liver Function Tests: Recent Labs  Lab 06/19/20 1151  AST 164*  ALT 71*  ALKPHOS 169*  BILITOT 2.8*  PROT 6.0*  ALBUMIN 2.8*    Recent Labs  Lab 06/19/20 1151  AMMONIA 49*   CBC: Recent Labs  Lab 06/16/20 0930 06/19/20 1151  WBC 7.1 6.6  HGB 11.9* 11.7*  HCT 36.8* 36.3*  MCV 98.7 97.3  PLT 24* 20*    Recent Results (from the past 240 hour(s))  Body fluid culture     Status: None   Collection Time: 06/12/20  6:29 PM   Specimen: Peritoneal Washings; Body Fluid  Result Value Ref Range Status   Specimen Description   Final    PERITONEAL Performed at [facility], [address] [place], [place] [address]    Special Requests   Final    NONE Performed at [facility], [address] [place], [place] [address]  Gram Stain   Final    MODERATE WBC PRESENT,BOTH PMN AND MONONUCLEAR NO ORGANISMS SEEN    Culture   Final    NO GROWTH 3 DAYS Performed at [facility] [address], [place], [place] [address]    Report Status 06/16/2020 FINAL  Final      Scheduled Meds: . Avatrombopag Maleate  40 mg Oral Daily  . baclofen  10 mg Oral TID  . ciprofloxacin  500 mg Oral Daily  . collagenase   Topical Daily  . feeding supplement  237 mL Oral TID BM  . folic acid  1 mg Oral Daily  . furosemide  40 mg Oral BID  . hydrocortisone   Rectal TID  . lactulose  15 g Oral Daily  . loratadine  10 mg Oral Daily  . magnesium oxide  400 mg Oral Daily  . multivitamin with minerals  1 tablet Oral Daily  . pantoprazole  40 mg Oral BID  . phenylephrine  1 suppository Rectal BID  . rifaximin  550 mg Oral BID  . sodium chloride flush  3 mL Intravenous Q12H  . spironolactone  25 mg Oral Daily   Continuous  Infusions: . sodium chloride    . sodium chloride Stopped (05/19/2020 2338)    Assessment/Plan:  1. Liver cirrhosis with ascites and hepatic encephalopathy.  Continue lactulose.  Goal 3 bowel movements a day.  Mental status okay today. 2. Recurrent ascites with peritoneal catheter.  Patient stated he let the nurse remove 950 mL today. 3. SBP with strep mitis.  Completed course with Rocephin and now on Cipro prophylaxis. 4. 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  Brother and family unable to retrieve his Doptelet medication. 5. Hepatocellular carcinoma and cholangiocarcinoma.  Dr. Grayland Ormond applied to take over care.  Hopefully will hear results back on this on Monday. 6. GERD on PPI 7. Stage II sacral ulcer.  See description below  Pressure Injury 05/29/20 Sacrum Stage 2 -  Partial thickness loss of dermis presenting as a shallow open injury with a red, pink wound bed without slough. (Active)  05/29/20 1024  Location: Sacrum  Location Orientation:   Staging: Stage 2 -  Partial thickness loss of dermis presenting as a shallow open injury with a red, pink wound bed without slough.  Wound Description (Comments):   Present on Admission:        Code Status:     Code Status Orders  (From admission, onward)         Start     Ordered   05/14/2020 0935  Full code  Continuous        05/30/2020 0936        Code Status History    This patient has a current code status but no historical code status.   Advance Care Planning Activity    Advance Directive Documentation     Most Recent Value  Type of Advance Directive Living will  Pre-existing out of facility DNR order (yellow form or pink MOST form) --  "MOST" Form in Place? --     Family Communication: Spoke with brother briefly on the phone but seem to get cut off. Disposition Plan: Status is: Inpatient  Dispo:  Patient From: Home  Planned Disposition: [place]  Expected discharge date: 06/23/20  Medically  stable for discharge: Once able to get approval from transitional care team I will get him out to rehab.  Time spent: 27 minutes  Kennieth Plotts [employer]  [facility]

## 2020-07-07 NOTE — Death Summary Note (Signed)
DEATH SUMMARY   Patient Details  Name: John [name] MRN: [id number] DOB: [birth year]  Admission/Discharge Information   Admit Date:  2020-06-18  Date of Death: Date of Death: 07-15-20  Time of Death: Time of Death: 64  Length of Stay: 12-25-2022  Referring Physician: Cletis Athens, MD   Reason(s) for Hospitalization  Admitted with abdominal pain.  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 year old male who initially came in with abdominal pain and found to have SBP with strep mitis growing out of the cultures.  The patient completed a 5-day course of Rocephin and on Cipro prophylaxis.  Patient has a history of liver cirrhosis with ascites and hepatic encephalopathy. The patient is on lactulose for goal of 3 bowel movements a day.  Patient has had intermittent ascites drainage with his peritoneal catheter.  The patient has chronic thrombocytopenia.  The patient usually takes Doptelet as outpatient.  We were unable to obtain this medication here in the hospital and family unable to locate at home.  His platelet count on last check was 20,000.  The patient also has a history of hepatocellular carcinoma and cholangiocarcinoma and normally followed at [facility].  Dr. Grayland Ormond applied to take over care.  We are still waiting for results of  this before he went out to rehab.  Patient also had a stage II sacral ulcer with partial-thickness loss of dermis presenting as a shallow open injury with red-pink wound bed without slough.  I saw the patient this morning and he got up himself to have a bowel movement on the commode.  He was feeling okay at that time.   This afternoon he got up did not feel well while walking and they lied him back into the bed and they ended up calling a CODE BLUE.  They started CPR and gave 1 unit of epinephrine he regained a pulse and rhythm and had a systolic blood pressure of 108.  The patient did have agonal breathing and was blue and was being bagged and the ER physician intubated.  I was giving signout to the critical care specialist on the patient as the patient being moved over to the critical care unit.  Dr. Lanney Gins apparently had to reintubate secondary to oxygen saturations being very low.  The patient had a another code and did not survive.   Pertinent Labs and Studies  Significant Diagnostic Studies DG Chest 1 View  Result Date: 2020-06-18 CLINICAL DATA:  Cough EXAM: CHEST  1 VIEW COMPARISON:  04/17/2012 FINDINGS: The heart size and mediastinal contours are within normal limits. Both lungs are clear. The visualized skeletal structures are unremarkable. IMPRESSION: No active disease. Electronically Signed   By: Ulyses Jarred M.D.   On: 2020-06-18 04:46   CT ABDOMEN PELVIS W CONTRAST  Result Date: 06/18/20 CLINICAL DATA:  Abdominal pain. Acute, nonlocalized. Stage IV liver cancer. EXAM: CT ABDOMEN AND PELVIS  WITH CONTRAST TECHNIQUE: Multidetector CT imaging of the abdomen and pelvis was performed using the standard protocol following bolus administration of intravenous contrast. CONTRAST:  66mL OMNIPAQUE IOHEXOL 300 MG/ML  SOLN COMPARISON:  09/10/2019 FINDINGS: Lower chest: Trace right pleural effusion. Hepatobiliary: The liver is cirrhotic. 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 Dominant  lesion within the anterior right lobe of liver measures 9.5 by 6.2 by 8.6 cm (volume = 270 cm^3), image 18/2. Previously 9.7 x 5.9 by 8.5 cm (volume = 250 cm^3). 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 New foci of low attenuation within the inferior right hepatic lobe measures 0.8 cm, image 32/2. Pancreas: Unremarkable. No pancreatic ductal dilatation or surrounding inflammatory changes. Spleen: Splenomegaly. Spleen has a cranial caudal dimension of 15.7 cm, image 63/5. Unchanged. Adrenals/Urinary Tract: Normal appearance of the adrenal glands. No kidney mass or hydronephrosis identified. Urinary bladder normal. Stomach/Bowel: Mild wall thickening of the stomach is identified. The appendix is visualized and appears normal. Mild wall thickening of the proximal ascending colon is identified. Vascular/Lymphatic: Aortic atherosclerosis. No aneurysm. Perisplenic varices. Recanalization of the umbilical vein. Upper abdominal adenopathy is again noted. Index portacaval node measures 3.8 x 3.4 cm, image 28/2. Previously 4.3 x 3.5 cm. Porta hepatic node measures 1.5 x 1.7 cm, image 21/2. Previously 2.7 by 2.5 cm. Soft tissue stranding is identified within the retroperitoneum along with multiple small lymph nodes. Similar to previous exam. Index left retroperitoneal lymph node measures 1 cm, image 53/2. Previously 1.7 cm. Aortocaval node measures 1.1 cm, image 41/2. Previously 1.6 cm. No pelvic or inguinal adenopathy. Reproductive: Prostate is unremarkable. Other: Small volume of abdominopelvic ascites is similar to previous exam. Tunneled peritoneal catheter is identified which enters from a right lower quadrant approach with tip terminating in the left lower quadrant. Musculoskeletal: No acute or significant osseous findings. IMPRESSION: 1. Mild wall thickening of the stomach and proximal ascending colon. Nonspecific in the setting of ascites and portal venous hypertension. Correlate for any clinical  signs or symptoms of gastritis and or colitis 2. Multifocal hepatocellular carcinoma is identified within a background of cirrhosis. Tumor burden within the liver appears similar to previous exam with a new focal area of low attenuation within the inferior right hepatic lobe. Thrombus within the middle portal vein is identified. 3. Mild decrease in size of upper abdominal adenopathy. 4. Stigmata of portal venous hypertension is identified including ascites, splenomegaly and varices. 5. Similar appearance of soft tissue stranding within the retroperitoneum with multiple small lymph nodes. 6. Aortic atherosclerosis. Aortic Atherosclerosis (ICD10-I70.0). Electronically Signed   By: Kerby Moors M.D.   On: 05/20/2020 07:07    Microbiology Recent Results (from the past 240 hour(s))  Body fluid culture     Status: None   Collection Time: 06/12/20  6:29 PM   Specimen: Peritoneal Washings; Body Fluid  Result Value Ref Range Status   Specimen Description   Final    PERITONEAL Performed at [facility], [address] [place], [place] [address]    Special Requests   Final    NONE Performed at [facility], [address] [place], [place] [address]    Gram Stain   Final    MODERATE WBC PRESENT,BOTH PMN AND MONONUCLEAR NO ORGANISMS SEEN    Culture   Final    NO GROWTH 3 DAYS Performed at [facility], [place] [address] [place], [place] [address]    Report Status 06/16/2020 FINAL  Final    Lab  Basic Metabolic Panel: Recent Labs  Lab 06/16/20 0930 06/19/20 1151  NA 133* 132*  K 4.6 4.8  CL 93* 93*  CO2 32 29  GLUCOSE 114* 114*  BUN 36* 28*  CREATININE 0.95 0.98  CALCIUM 8.5* 8.1*  MG 2.2 2.1   Liver Function Tests: Recent Labs  Lab 06/19/20 1151  AST 164*  ALT 71*  ALKPHOS 169*  BILITOT 2.8*  PROT 6.0*  ALBUMIN 2.8*    Recent Labs  Lab 06/19/20 1151  AMMONIA 49*   CBC: Recent Labs  Lab 06/16/20 0930 06/19/20 1151  WBC 7.1 6.6  HGB 11.9*  11.7*  HCT 36.8* 36.3*  MCV 98.7 97.3  PLT 24* 20*   Sepsis Labs: Recent Labs  Lab 06/16/20 0930 06/19/20 1151  WBC 7.1 6.6    Traylon Schimming Jun 30, 2020, 2:01 PM

## 2020-07-07 DEATH — deceased

## 2020-08-25 ENCOUNTER — Encounter: Payer: Medicare Other | Admitting: Pain Medicine
